# Patient Record
Sex: Female | Born: 1945 | Race: Black or African American | Hispanic: No | Smoking: Former smoker
Health system: Southern US, Community
[De-identification: ages and names within clinical notes are randomized; demographics above are authoritative.]

## PROBLEM LIST (undated history)

## (undated) DIAGNOSIS — I2510 Atherosclerotic heart disease of native coronary artery without angina pectoris: Secondary | ICD-10-CM

## (undated) DIAGNOSIS — I10 Essential (primary) hypertension: Secondary | ICD-10-CM

## (undated) DIAGNOSIS — Z803 Family history of malignant neoplasm of breast: Secondary | ICD-10-CM

## (undated) DIAGNOSIS — C801 Malignant (primary) neoplasm, unspecified: Secondary | ICD-10-CM

## (undated) DIAGNOSIS — E785 Hyperlipidemia, unspecified: Secondary | ICD-10-CM

## (undated) DIAGNOSIS — J449 Chronic obstructive pulmonary disease, unspecified: Secondary | ICD-10-CM

## (undated) DIAGNOSIS — K219 Gastro-esophageal reflux disease without esophagitis: Secondary | ICD-10-CM

## (undated) DIAGNOSIS — E119 Type 2 diabetes mellitus without complications: Secondary | ICD-10-CM

## (undated) DIAGNOSIS — M1990 Unspecified osteoarthritis, unspecified site: Secondary | ICD-10-CM

## (undated) DIAGNOSIS — Z8042 Family history of malignant neoplasm of prostate: Secondary | ICD-10-CM

## (undated) DIAGNOSIS — E079 Disorder of thyroid, unspecified: Secondary | ICD-10-CM

## (undated) DIAGNOSIS — E039 Hypothyroidism, unspecified: Secondary | ICD-10-CM

## (undated) DIAGNOSIS — Z807 Family history of other malignant neoplasms of lymphoid, hematopoietic and related tissues: Secondary | ICD-10-CM

## (undated) DIAGNOSIS — R911 Solitary pulmonary nodule: Secondary | ICD-10-CM

## (undated) HISTORY — DX: Disorder of thyroid, unspecified: E07.9

## (undated) HISTORY — DX: Hyperlipidemia, unspecified: E78.5

## (undated) HISTORY — DX: Type 2 diabetes mellitus without complications: E11.9

## (undated) HISTORY — DX: Solitary pulmonary nodule: R91.1

## (undated) HISTORY — DX: Unspecified osteoarthritis, unspecified site: M19.90

## (undated) HISTORY — DX: Chronic obstructive pulmonary disease, unspecified: J44.9

## (undated) HISTORY — PX: BREAST EXCISIONAL BIOPSY: SUR124

## (undated) HISTORY — DX: Family history of malignant neoplasm of breast: Z80.3

## (undated) HISTORY — DX: Essential (primary) hypertension: I10

## (undated) HISTORY — DX: Family history of other malignant neoplasms of lymphoid, hematopoietic and related tissues: Z80.7

## (undated) HISTORY — PX: THYROIDECTOMY: SHX17

## (undated) HISTORY — DX: Family history of malignant neoplasm of prostate: Z80.42

---

## 2006-01-25 ENCOUNTER — Ambulatory Visit: Payer: Self-pay | Admitting: Family Medicine

## 2006-01-25 IMAGING — US US PELV - US TRANSVAGINAL
1 series · 14 of 25 positions shown · non-contrast
Comparison: none

REASON FOR EXAM: abdominopelvic pain
COMMENTS:

[Series 1: us pelv - us transvaginal · 0.31mm/px · 14 of 56 slices shown]
[im 1/56]
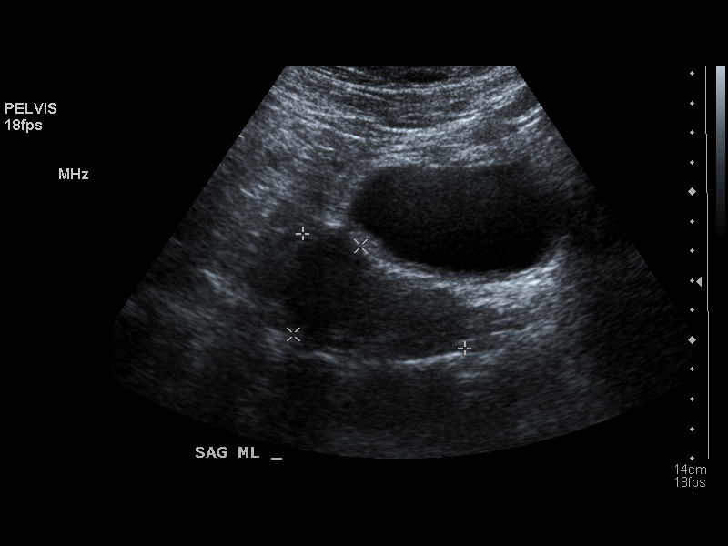
[im 5/56]
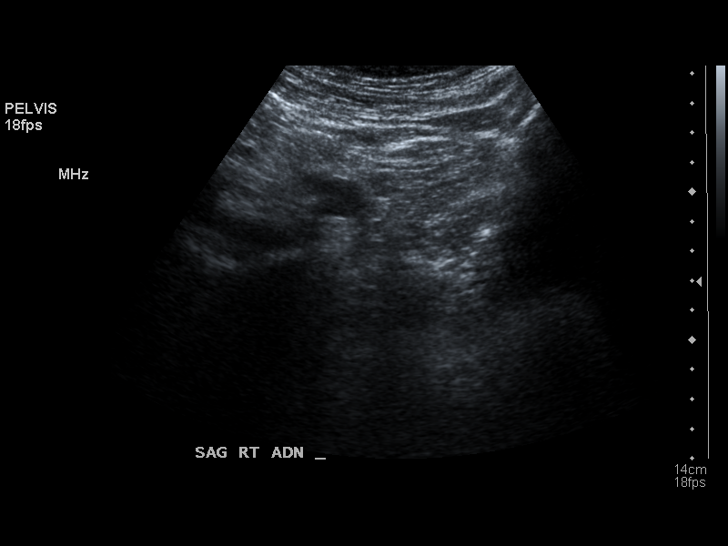
[im 10/56]
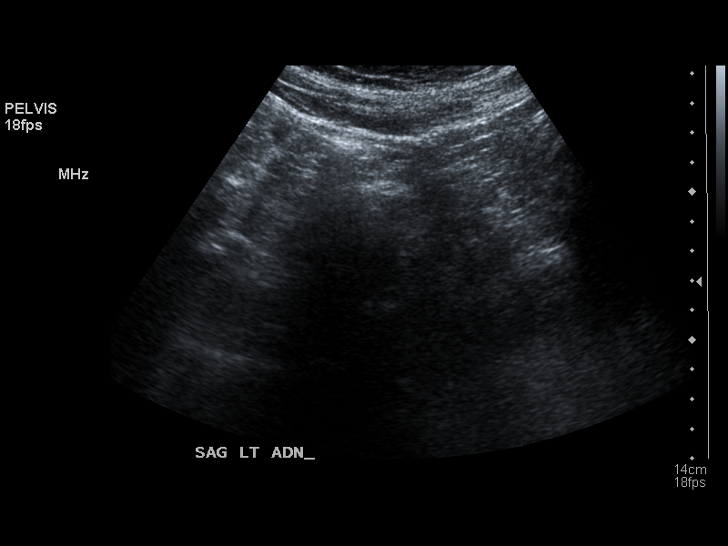
[im 14/56]
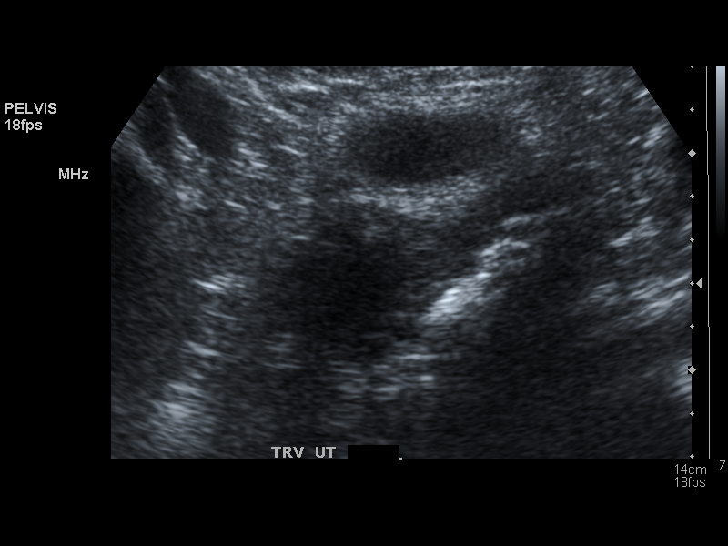
[im 19/56]
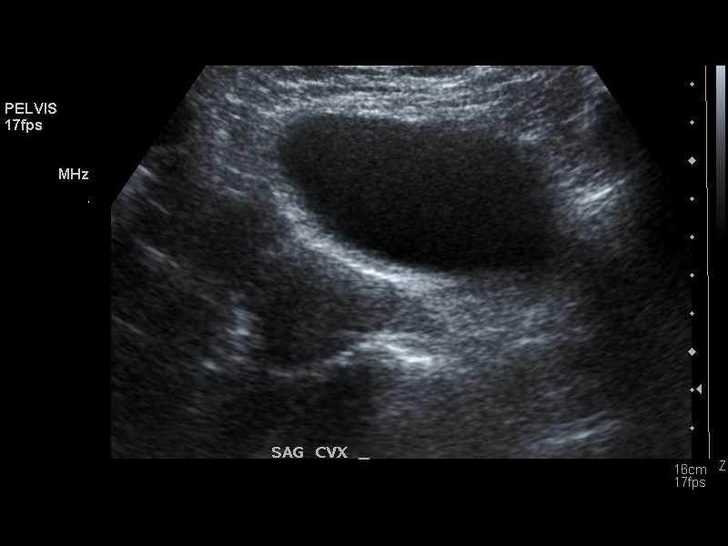
[im 21/56]
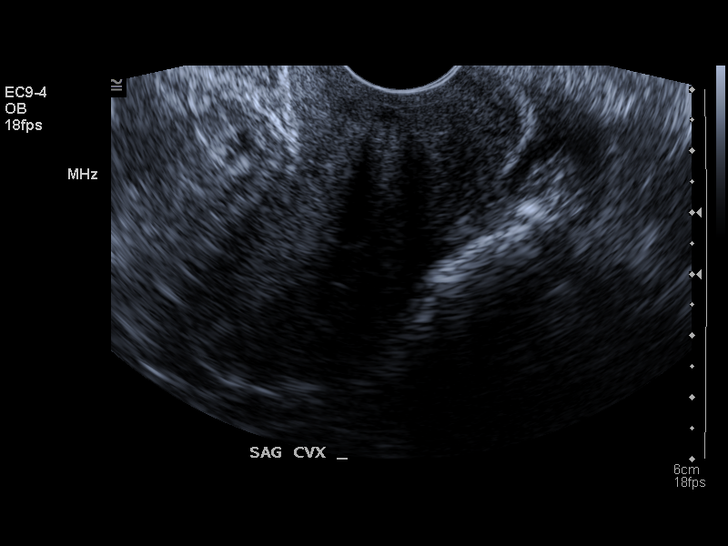
[im 26/56]
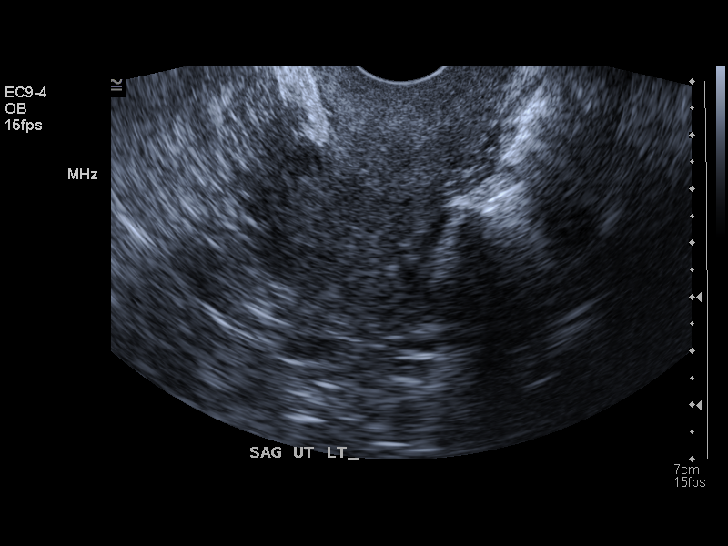
[im 30/56]
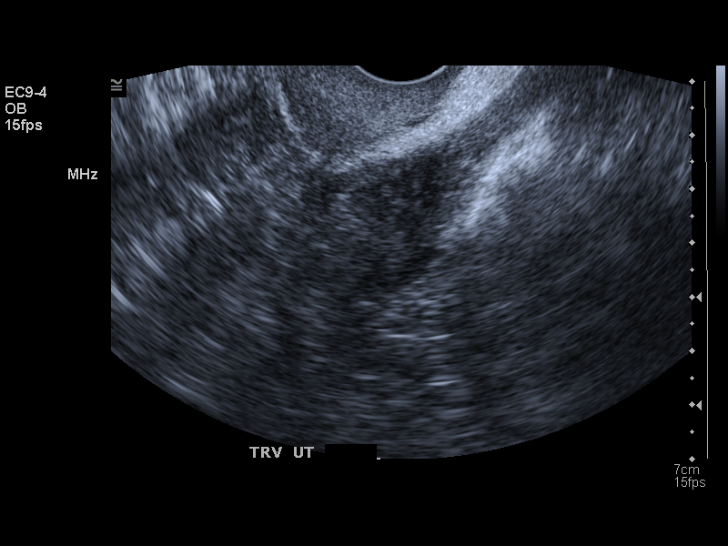
[im 35/56]
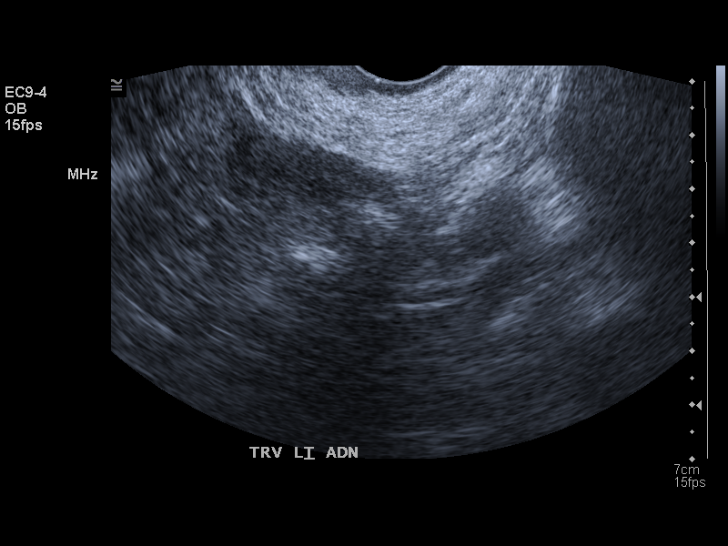
[im 37/56]
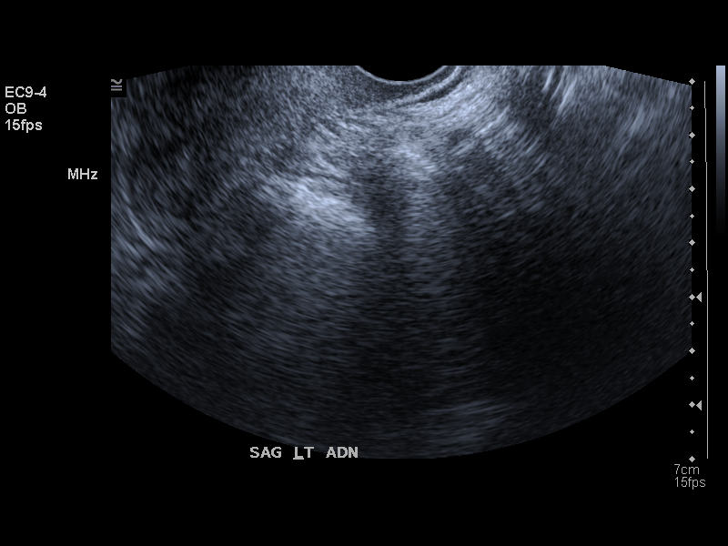
[im 42/56]
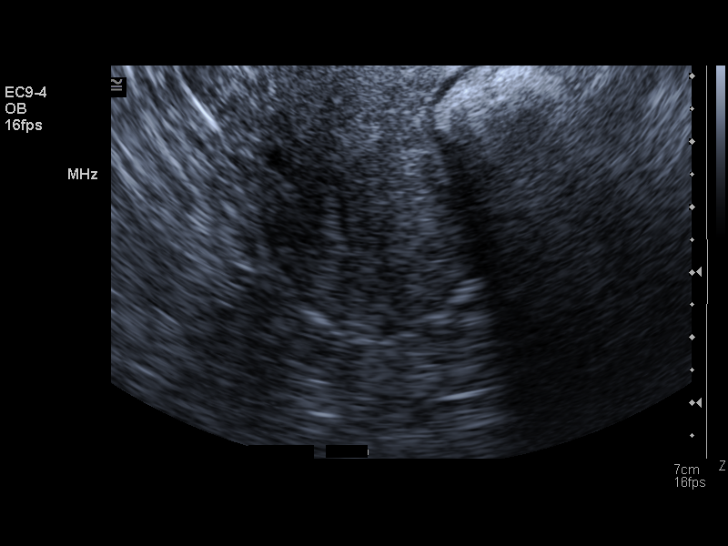
[im 46/56]
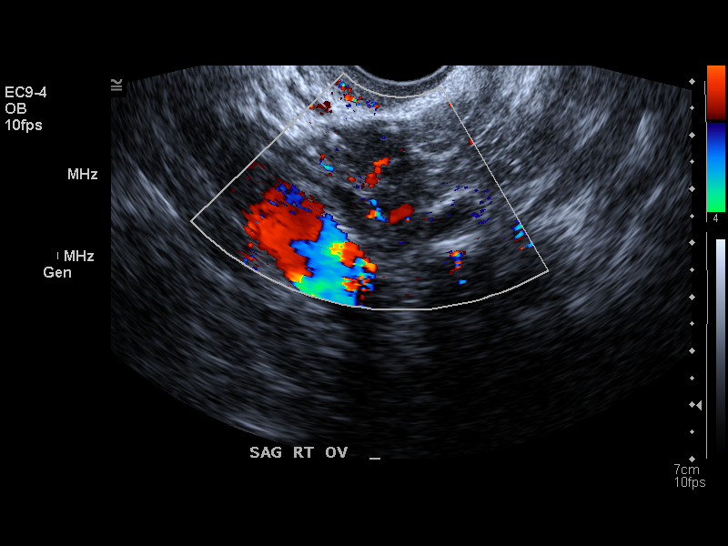
[im 51/56]
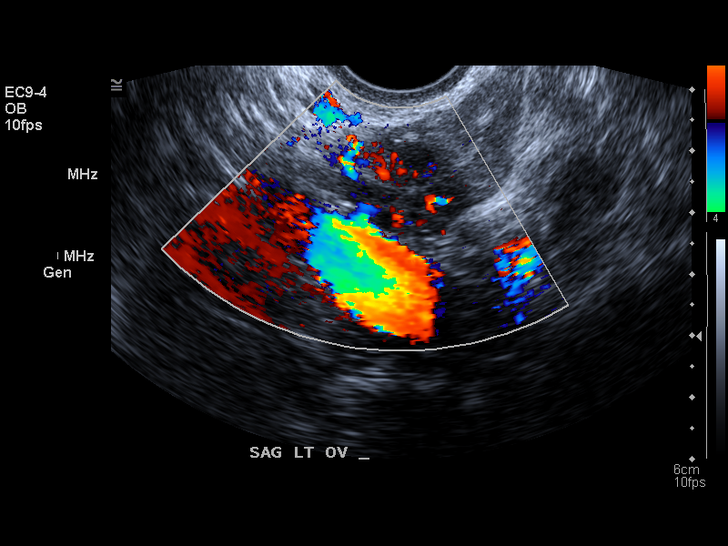
[im 56/56]
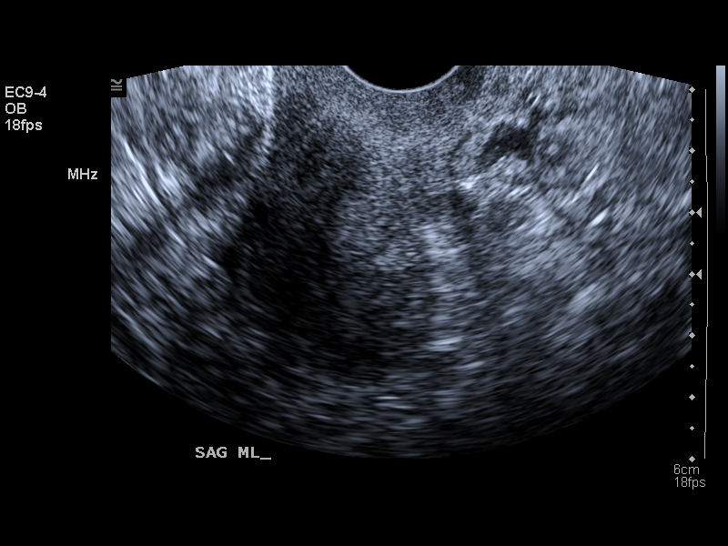

[14 of 25 positions shown; findings below may reference images not displayed]

PROCEDURE:     US  - US PELVIS MASS EXAM  - [DATE]  [DATE] [DATE] [DATE]

RESULT:     The uterus measures 7.02 cm x 3.24 cm x 3.46 cm.  No uterine
mass lesions are seen. The endometrium measures 3.9 mm in thickness. There
is a trace of fluid in the endometrial cavity.  No abnormal adnexal masses
are identified. The RIGHT ovary measures 2.63 cm at maximum diameter and the
LEFT ovary measures 2.17 cm at maximum diameter.  No abnormal adnexal masses
are seen.  The kidneys show no hydronephrosis.
IMPRESSION: 1)No uterine mass lesions are seen.

2)The endometrium appears slightly thickened for the patient's age.
Additionally, there is noted a small amount of fluid in the endometrial
cavity, which is an abnormal finding for the post-menopausal patient.

3)No abnormal adnexal masses are seen.

4)There is no free fluid identified in the pelvis.

## 2006-01-25 IMAGING — US ABDOMEN ULTRASOUND
1 series · 17 of 25 positions shown · non-contrast
Comparison: none

REASON FOR EXAM: abdominopelvic pain
COMMENTS:

[Series 1: abdomen ultrasound · 17 of 54 slices shown]
[im 1/54]
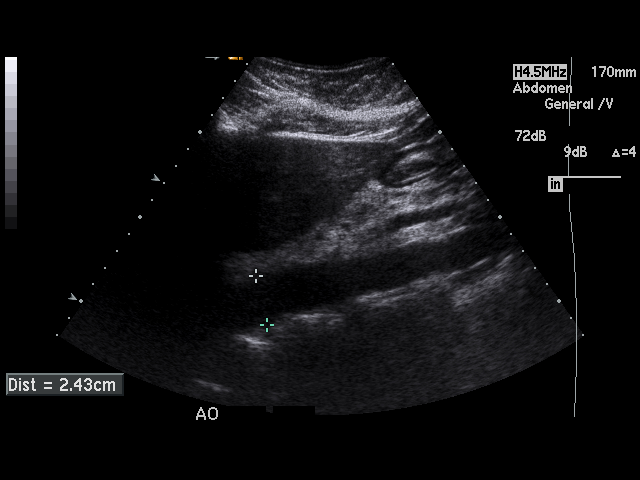
[im 5/54]
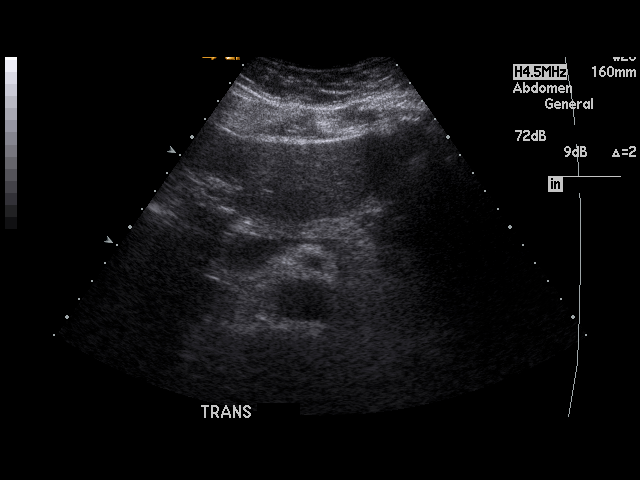
[im 7/54]
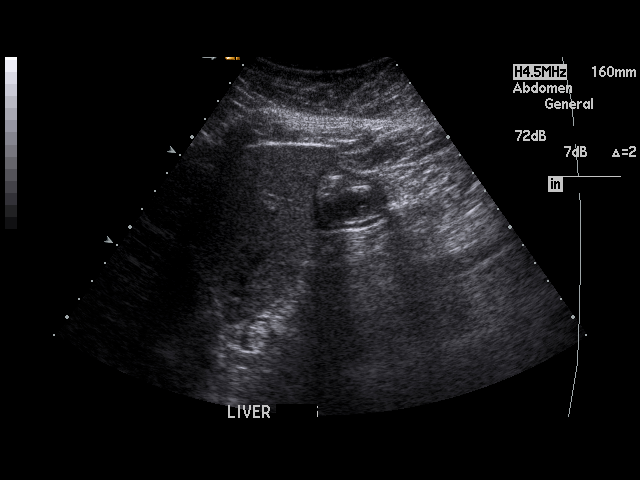
[im 12/54]
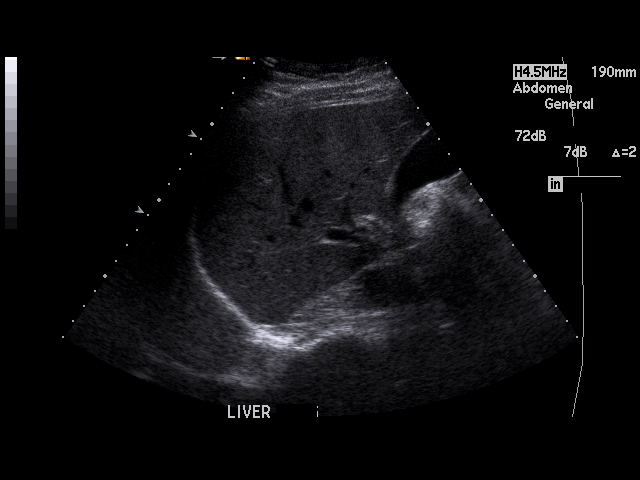
[im 14/54]
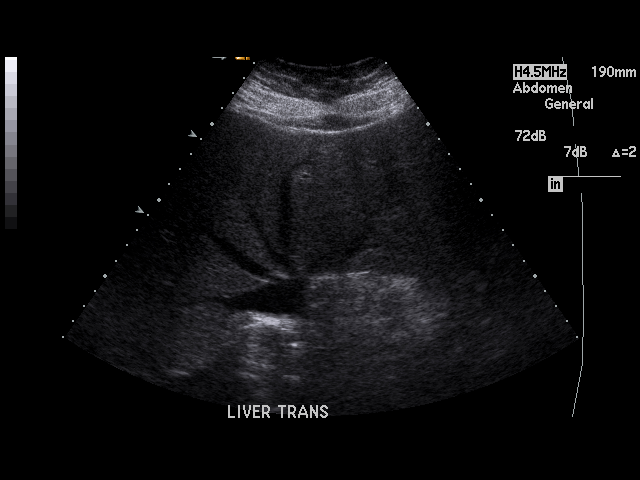
[im 18/54]
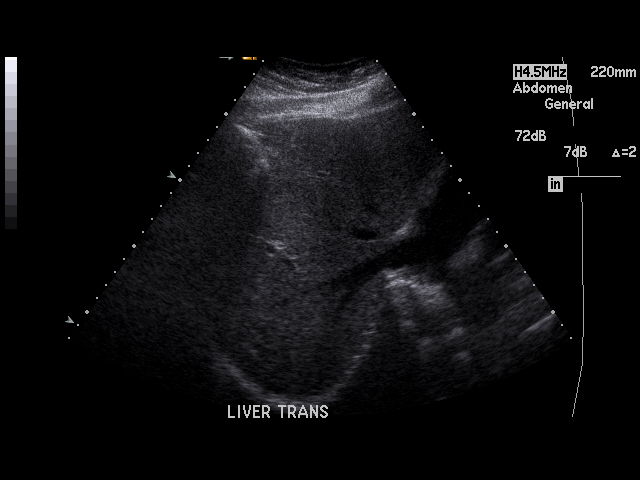
[im 20/54]
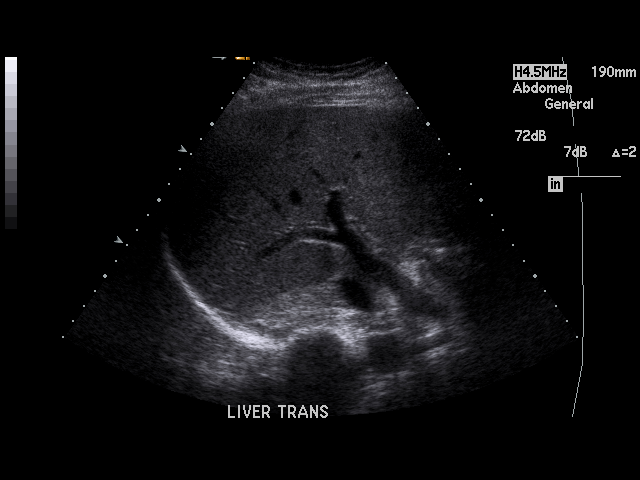
[im 25/54]
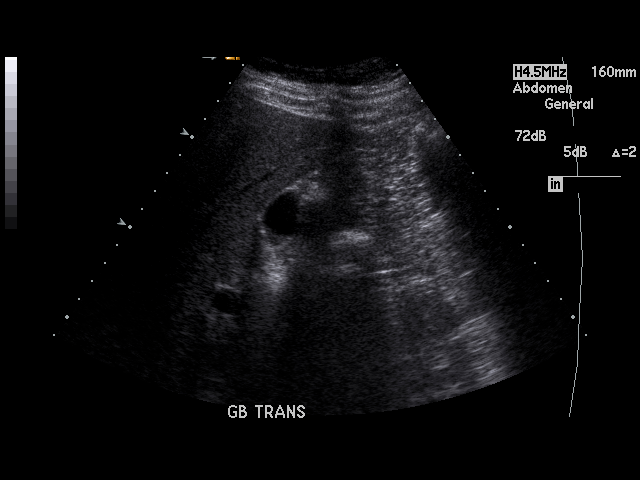
[im 27/54]
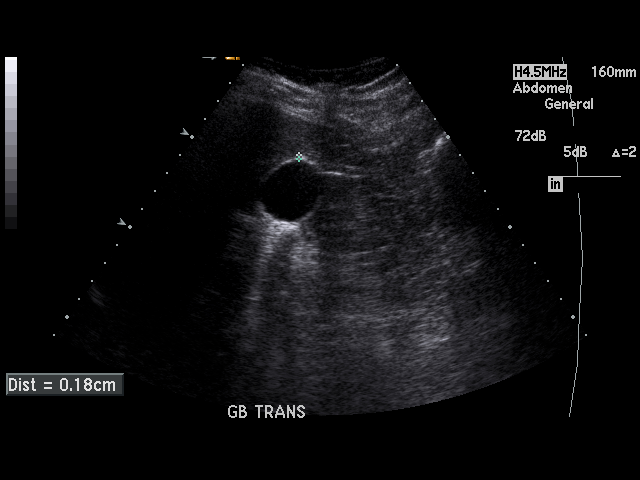
[im 29/54]
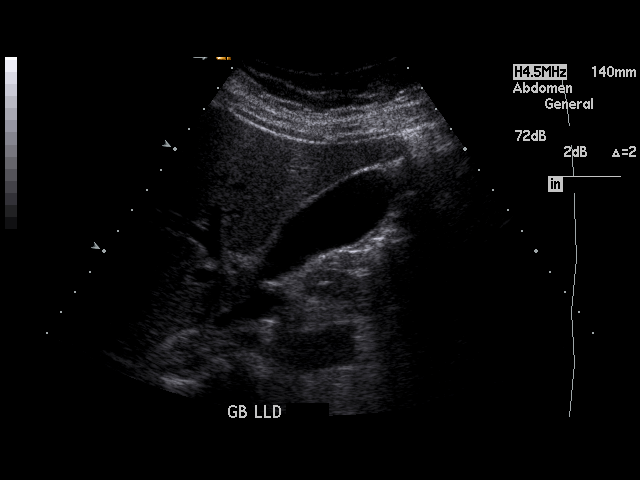
[im 34/54]
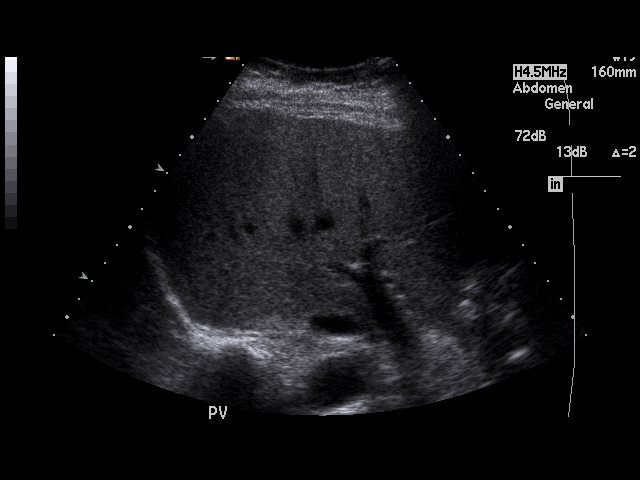
[im 36/54]
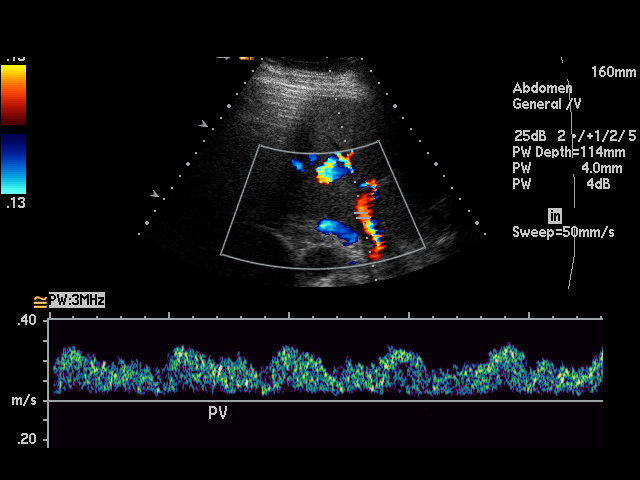
[im 40/54]
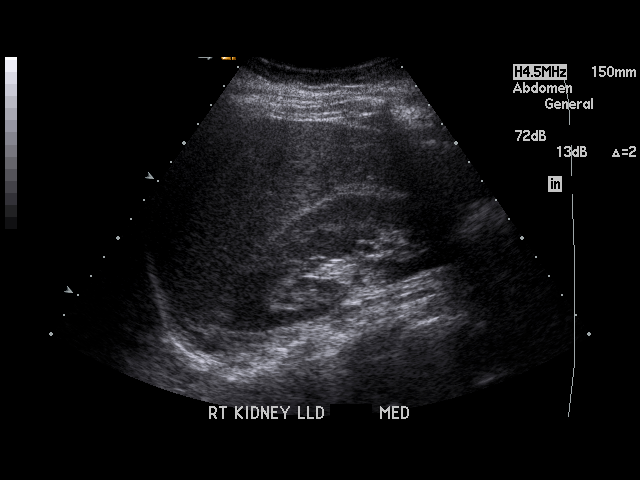
[im 42/54]
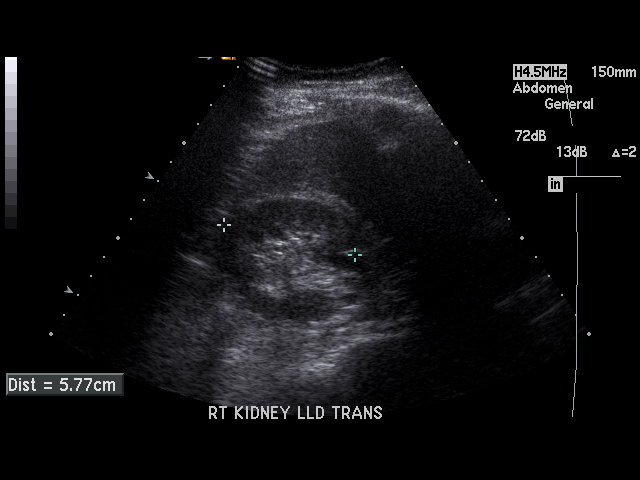
[im 47/54]
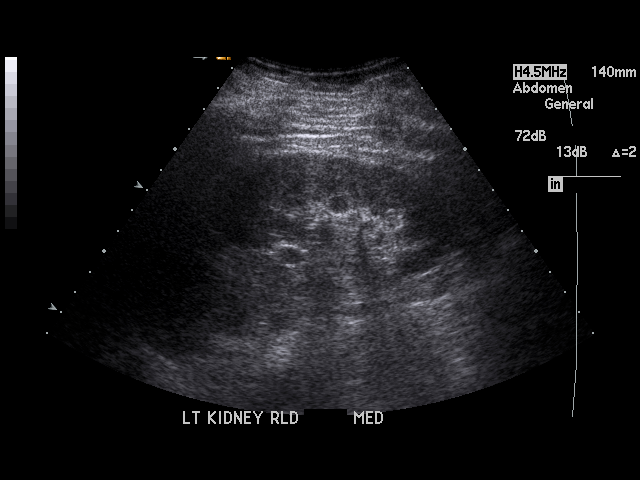
[im 49/54]
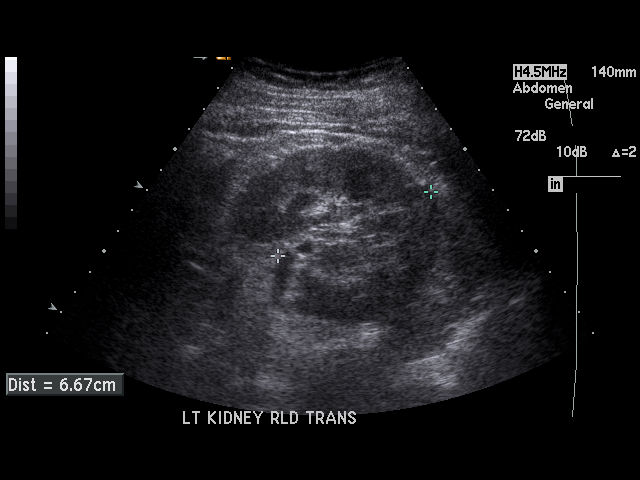
[im 54/54]
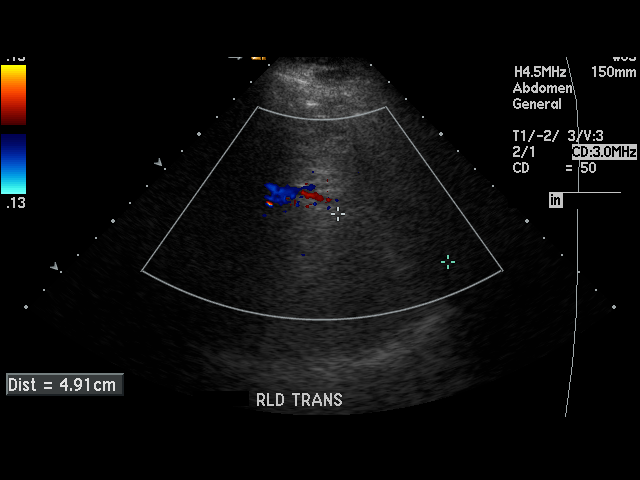

[17 of 25 positions shown; findings below may reference images not displayed]

PROCEDURE:     US  - US ABDOMEN GENERAL SURVEY  - [DATE]  [DATE]

RESULT:     The liver is normal in appearance. The spleen is not well seen
but appears to be normal in size. The pancreas is not visualized adequately
for evaluation on this exam.  The abdominal aorta is visualized and is
normal in appearance.  The gallbladder shows no significant abnormalities.
No gallstones are seen. There is no thickening of the gallbladder wall. The
common bile duct measures 3.7 mm in diameter, which is within normal limits.
The kidneys show no hydronephrosis. There is no ascites.
IMPRESSION: 1)No significant abnormalities are noted.

2)The pancreas and spleen are not optimally visualized on this exam.

3)No gallstones are seen.

## 2007-10-19 LAB — HM COLONOSCOPY: HM Colonoscopy: NORMAL

## 2009-01-22 ENCOUNTER — Ambulatory Visit: Payer: Self-pay | Admitting: Gastroenterology

## 2009-10-18 LAB — HM DIABETES FOOT EXAM

## 2009-12-01 ENCOUNTER — Ambulatory Visit: Payer: Self-pay | Admitting: Family Medicine

## 2009-12-01 IMAGING — MG MM DIGITAL SCREENING BILAT W/ CAD
1 series · 5 of 5 positions shown · non-contrast
Comparison: none

REASON FOR EXAM: CORP SCR
COMMENTS:

[Series 3179: R CC · right · 5 of 5 slices shown]
[im 1/5]
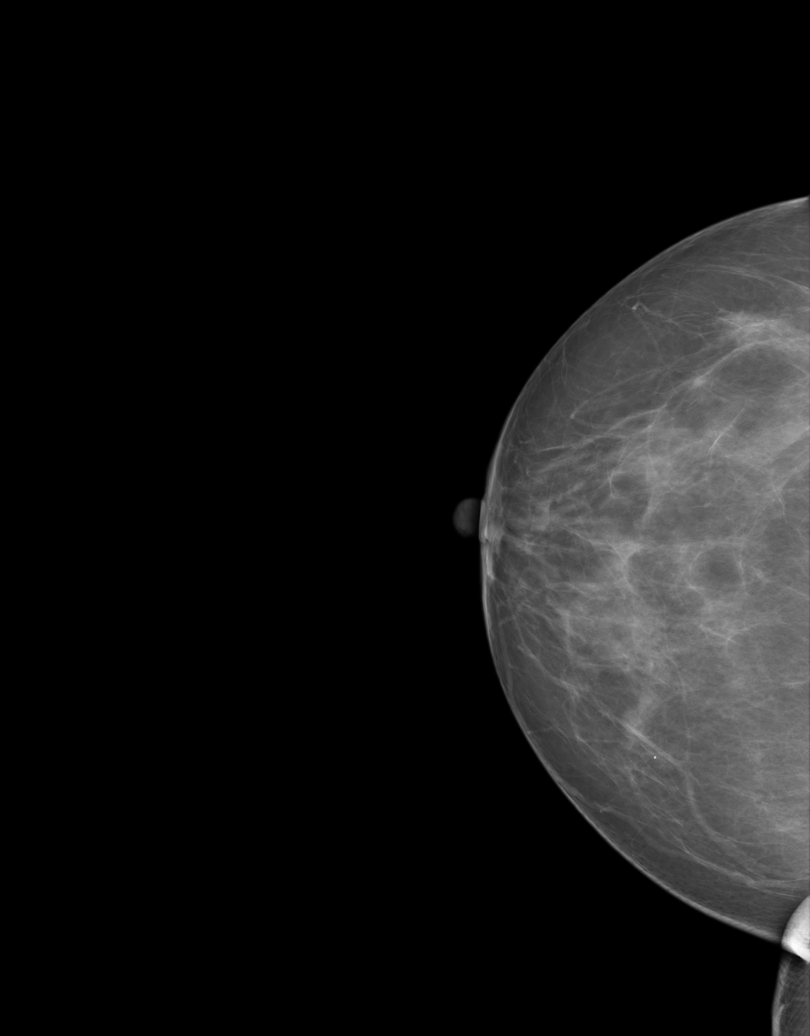
[im 2/5]
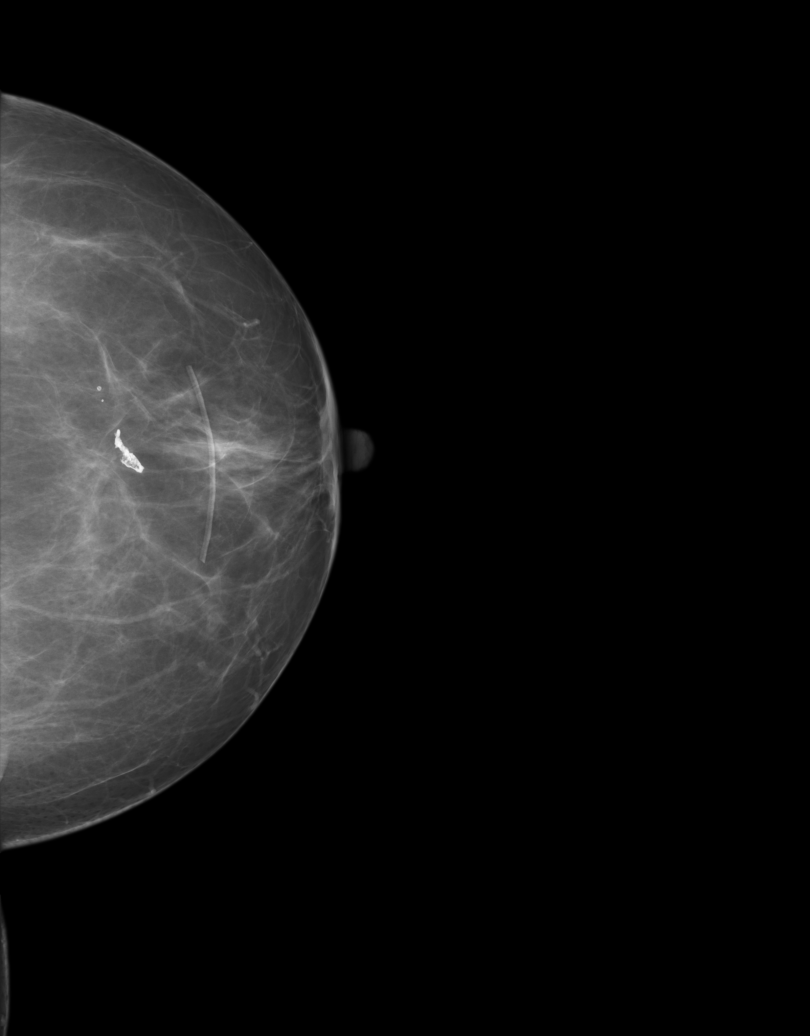
[im 3/5]
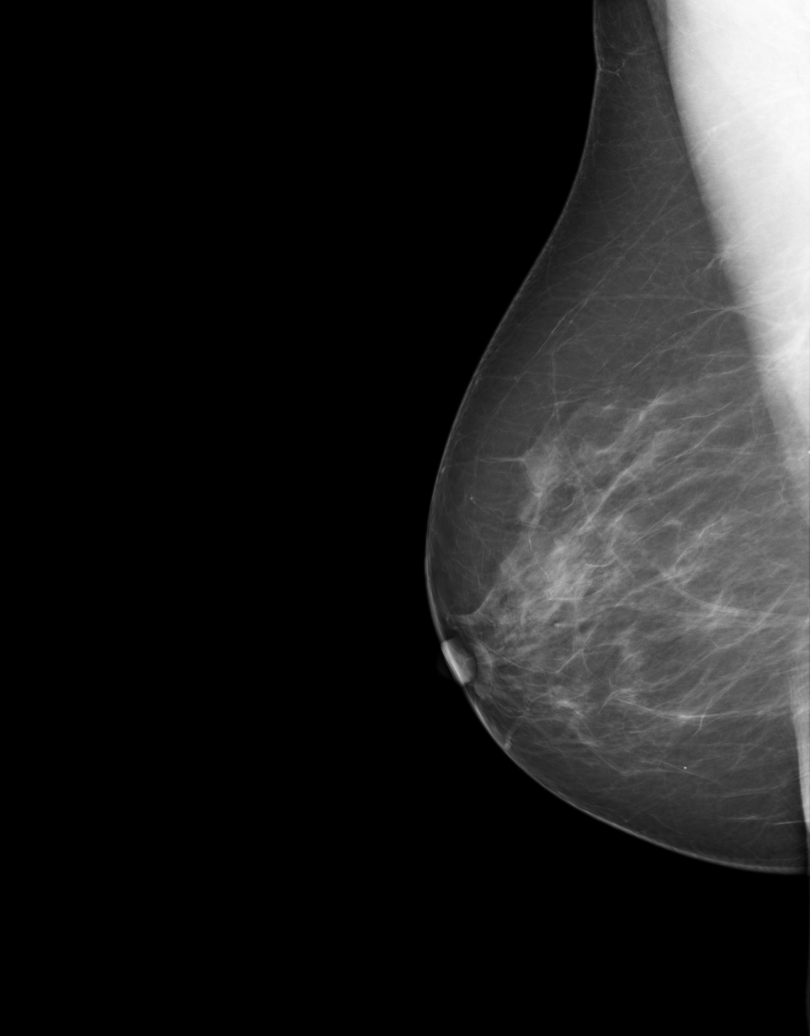
[im 4/5]
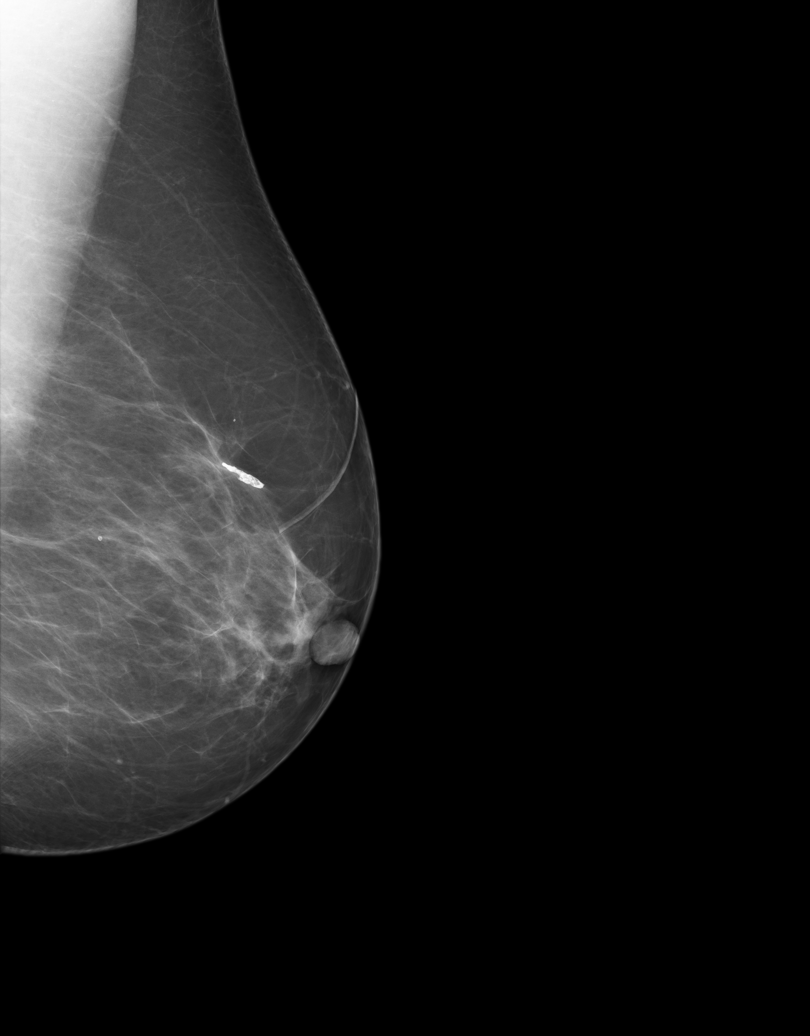
[im 5/5]
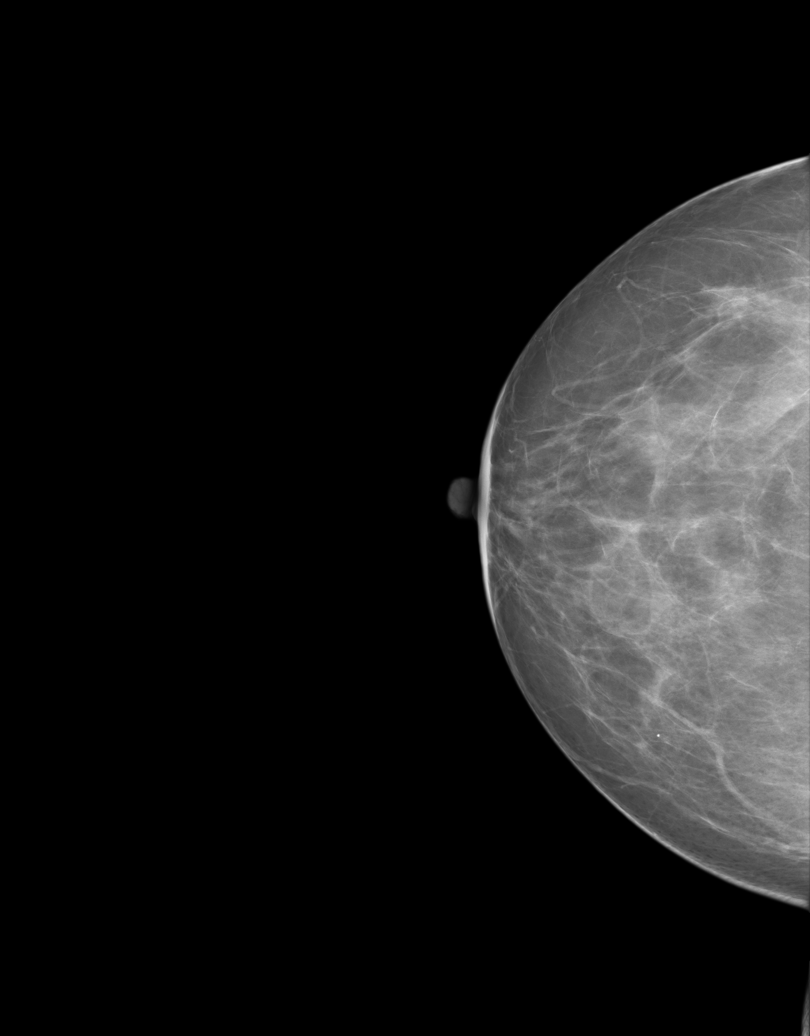

[5 of 5 positions shown; findings below may reference images not displayed]

PROCEDURE:     MAM - MAM CORP SCRN MAMMO DIGITAL /CAD  - [DATE]  [DATE]

RESULT:     Images are performed on [DATE] but not submitted until
[DATE] for interpretation. Comparison is made to previous analog images
dated [DATE] and to digital images from [REDACTED] dated [DATE] and to [HOSPITAL] Digital Images dated [DATE] as
well as [DATE].

The breasts exhibit a moderately dense parenchymal pattern. There is a scar
marker in the superior mid left breast centrally with some dystrophic
calcification posterior to that and some architectural distortion underlying
that. No developing density or dominant mass is present. There is no
malignant calcification. The appearance is stable.
IMPRESSION: Post operative changes in the left breast in the superior
central portion. No definite mammographic evidence of malignancy. Please
continue to encourage annual mammographic follow-up and monthly breast self
exam.

BI-RADS: Category 2 - Benign Findings

Thank you for this opportunity to contribute to the care of your patient.

A NEGATIVE MAMMOGRAM REPORT DOES NOT PRECLUDE BIOPSY OR OTHER EVALUATION OF
A CLINICALLY PALPABLE OR OTHERWISE SUSPICIOUS MASS OR LESION. BREAST CANCER
MAY NOT BE DETECTED BY MAMMOGRAPHY IN UP TO 10% OF CASES.

## 2010-12-14 ENCOUNTER — Ambulatory Visit: Payer: Self-pay | Admitting: Family Medicine

## 2010-12-14 IMAGING — MG MM DIGITAL SCREENING BILAT W/ CAD
1 series · 4 of 4 positions shown · non-contrast
Comparison: [DATE] [DATE].

REASON FOR EXAM: SCR CORP
COMMENTS:

PROCEDURE:     MAM - MAM CORP SCRN MAMMO DIGITAL /CAD  - [DATE]  [DATE]
RESULT:

[R CC · right · 4 of 4 slices shown]
[im 1/4]
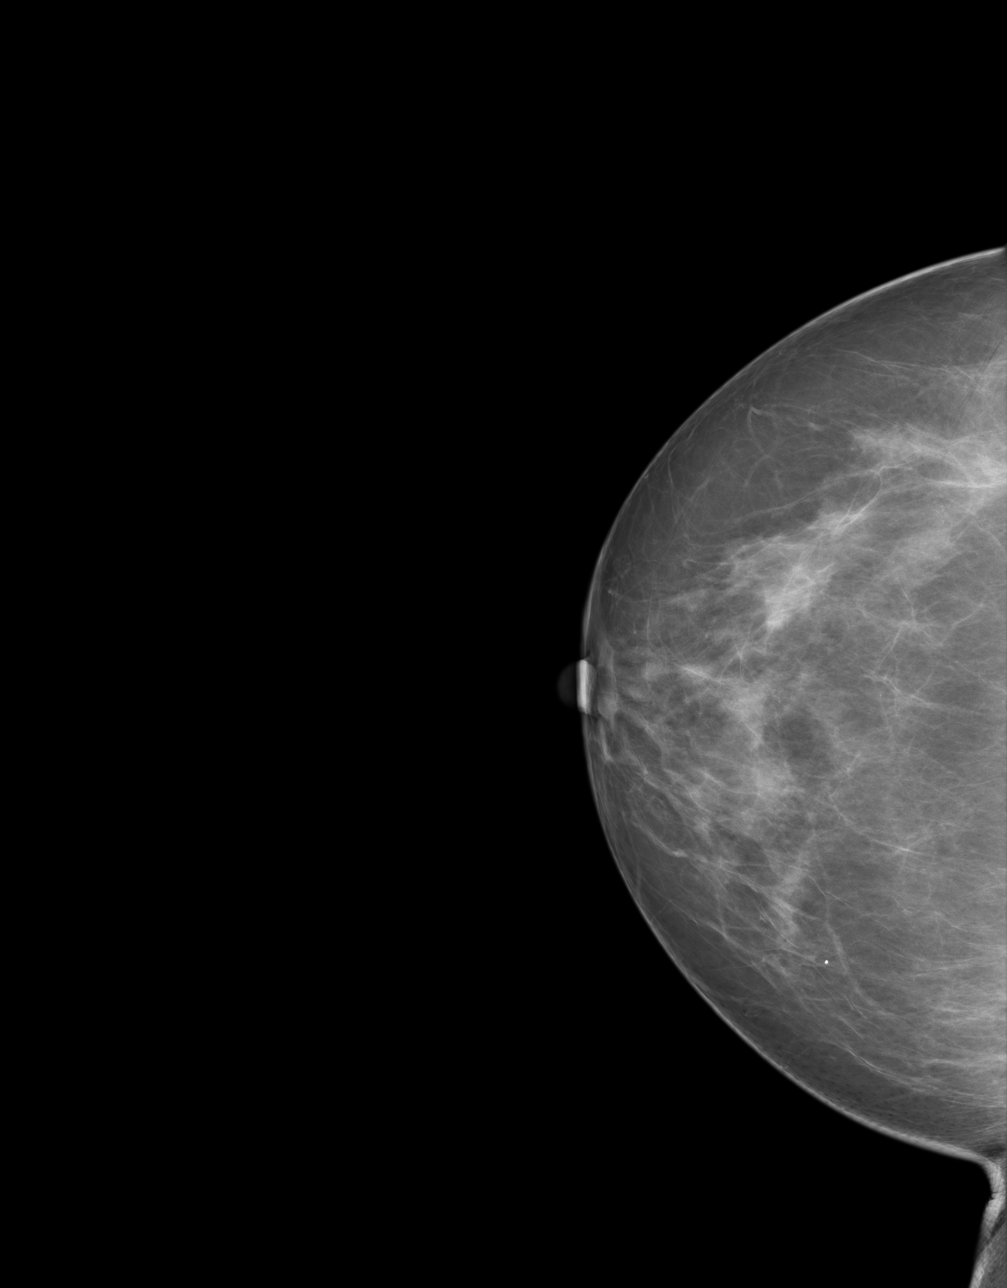
[im 2/4]
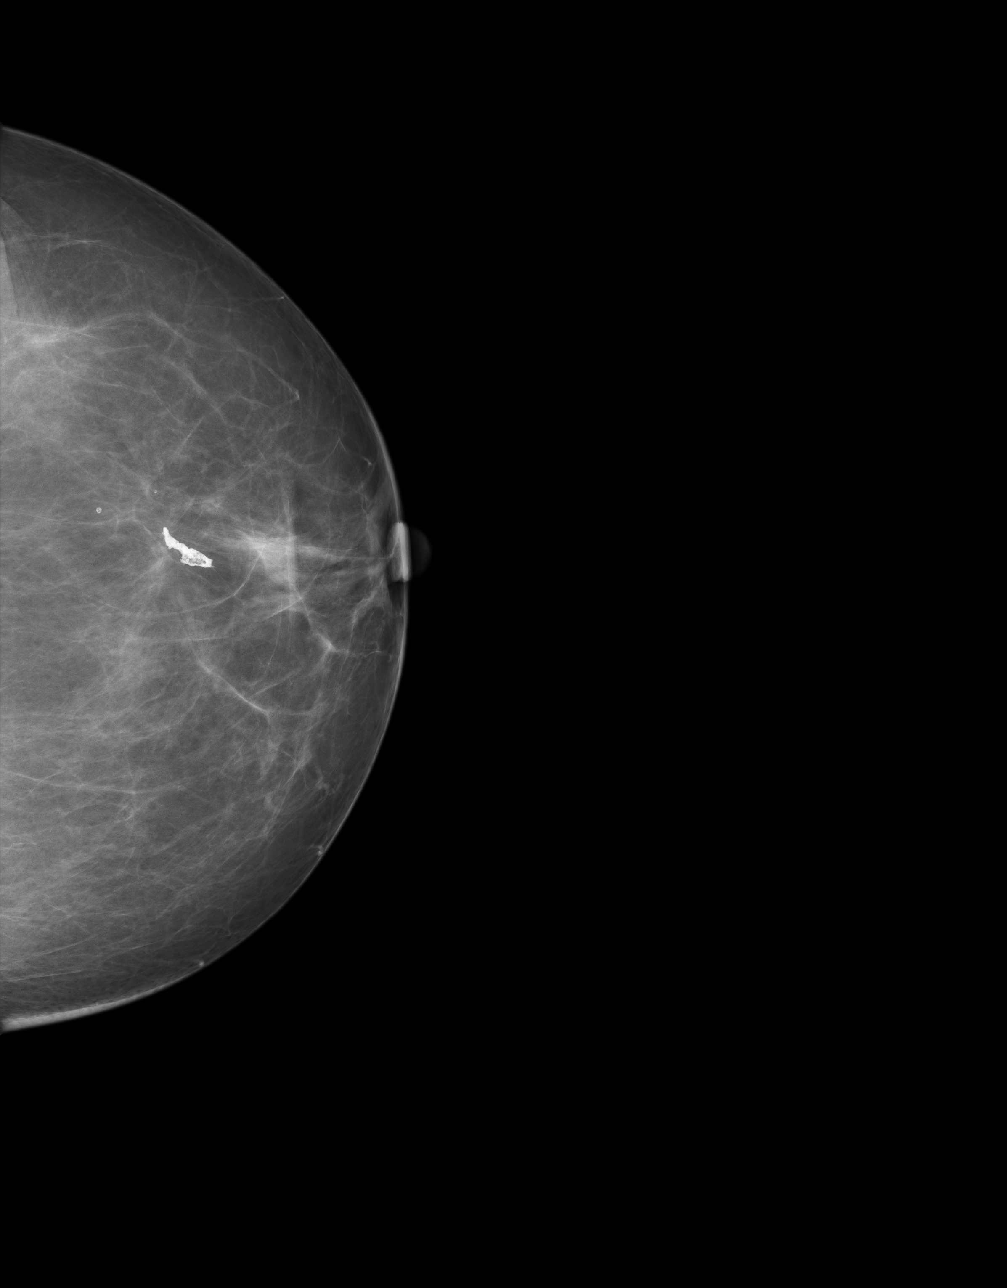
[im 3/4]
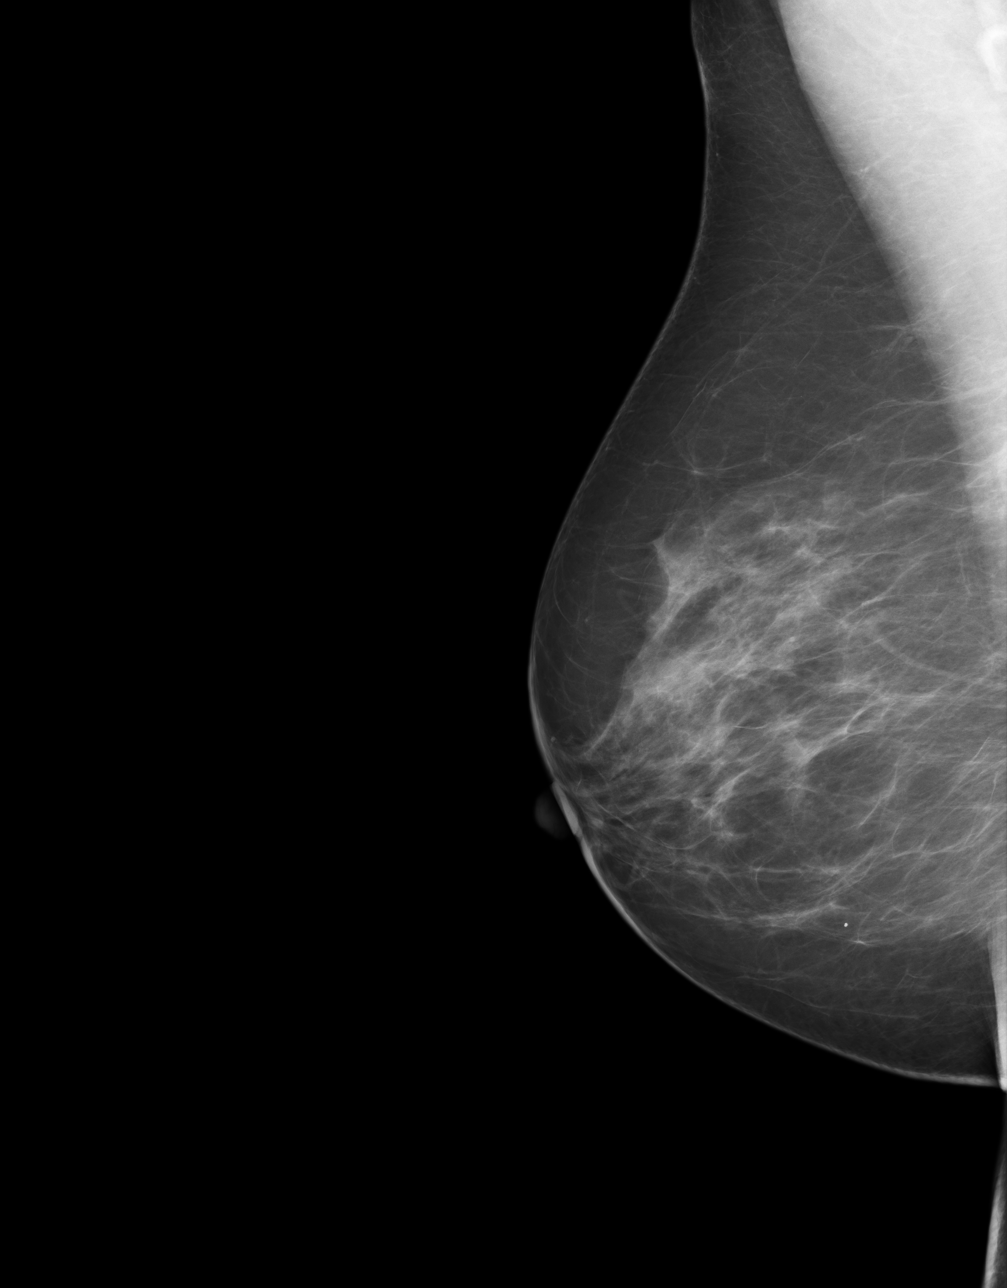
[im 4/4]
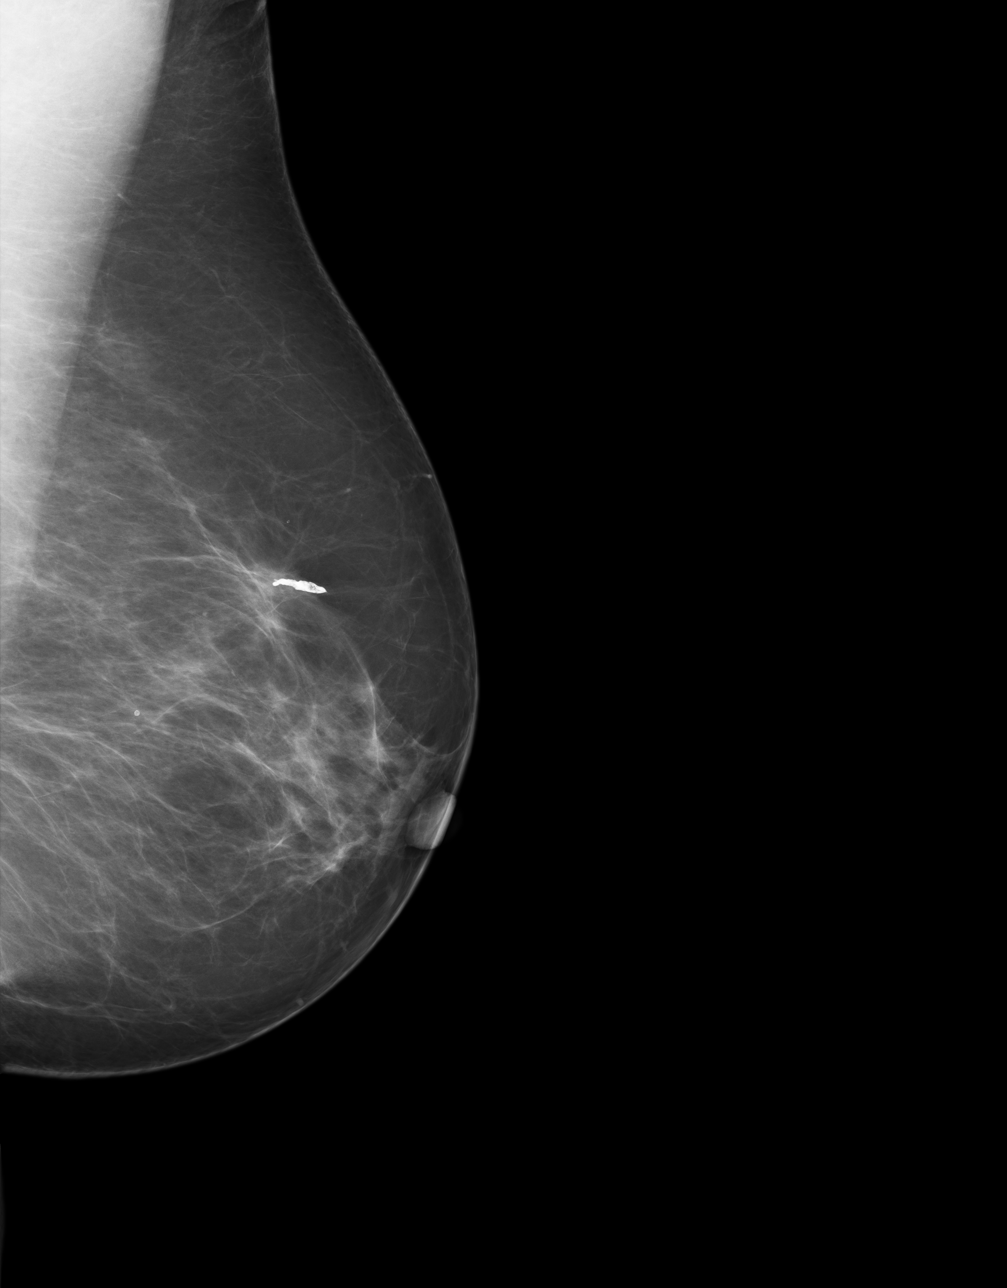

[4 of 4 positions shown; findings below may reference images not displayed]

FINDINGS: Bilateral breasts demonstrate a scattered fibroglandular density.
There is no dominant mass, architectural distortion or clusters of
suspicious microcalcifications.
IMPRESSION: 1.      Stable bilateral mammogram.
2.      Annual mammographic follow-up recommended.

BI-RADS: Category 2 - Benign Findings

Thank you for this opportunity to contribute to the care of your patient.

A NEGATIVE MAMMOGRAM REPORT DOES NOT PRECLUDE BIOPSY OR OTHER EVALUATION OF
A CLINICALLY PALPABLE OR OTHERWISE SUSPICIOUS MASS OR LESION. BREAST CANCER
MAY NOT BE DETECTED BY MAMMOGRAPHY IN UP TO 10% OF CASES.

## 2011-10-28 ENCOUNTER — Ambulatory Visit: Payer: Self-pay

## 2011-10-28 IMAGING — CR DG LUMBAR SPINE AP/LAT/OBLIQUES W/ FLEX AND EXT
1 series · 6 of 6 positions shown · non-contrast
Comparison: none

REASON FOR EXAM: back pain
COMMENTS:

[Series 1: t lumbar spine ap · 0.14mm/px · 6 of 6 slices shown]
[im 1/6]
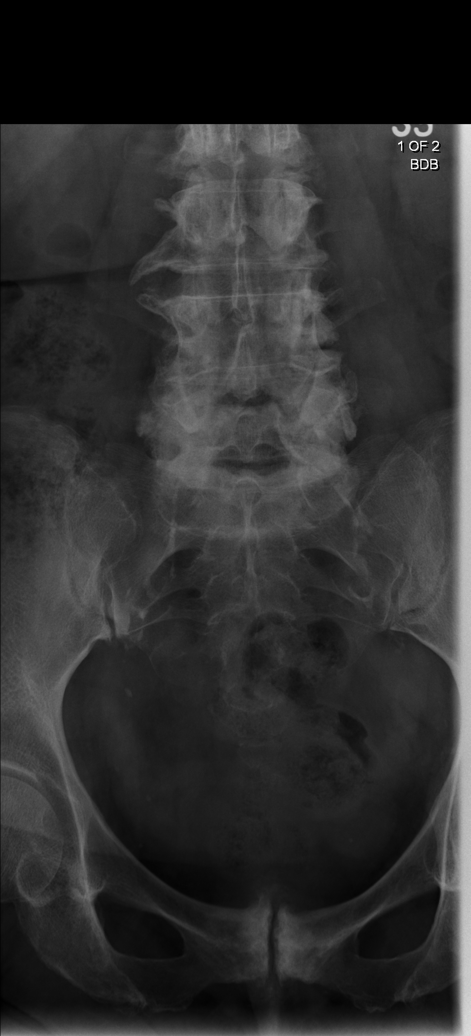
[im 2/6]
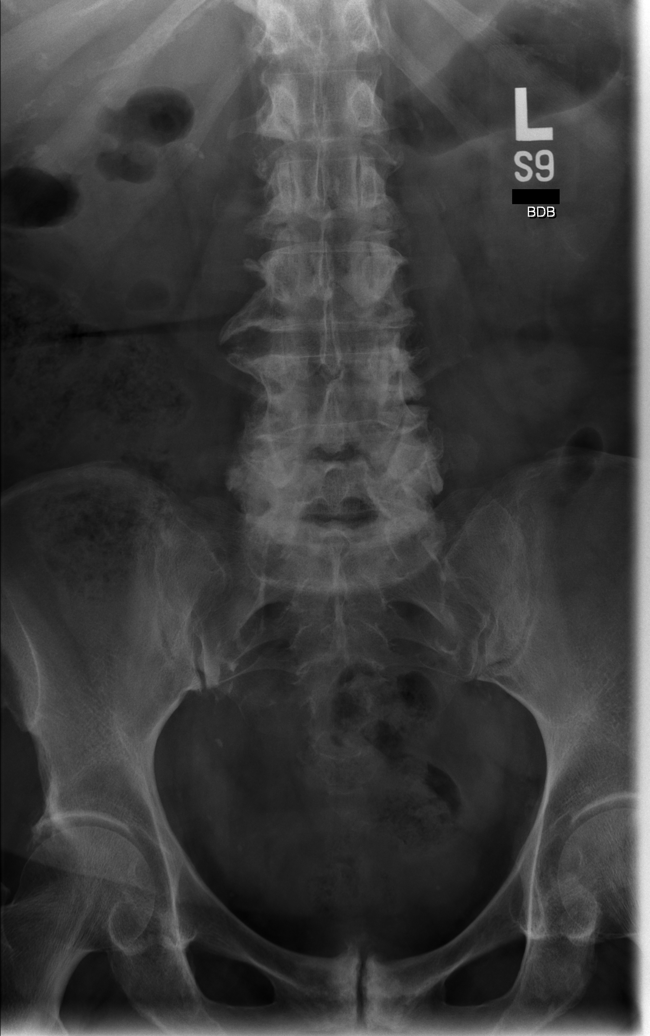
[im 3/6]
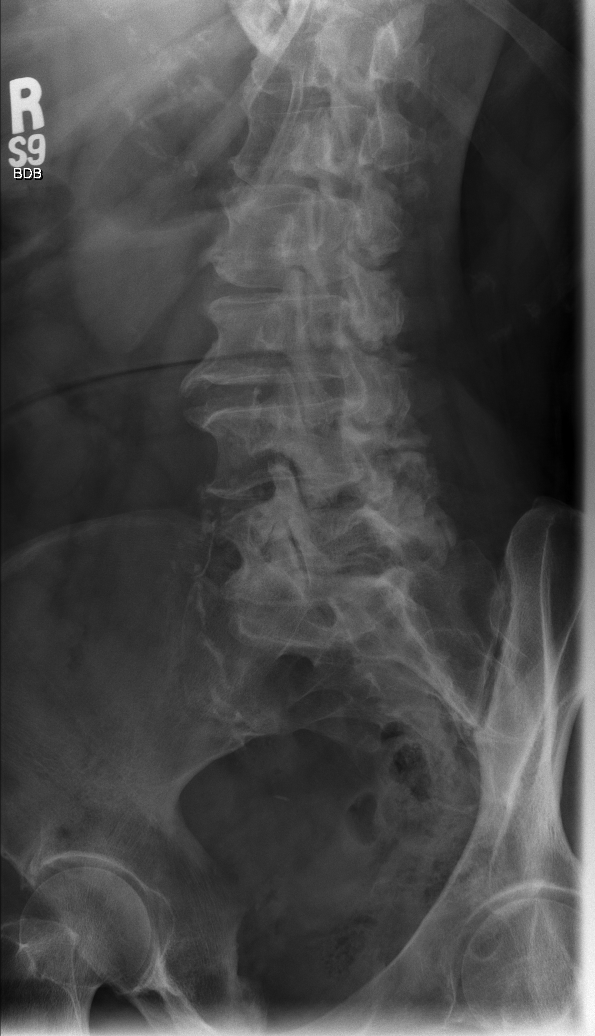
[im 4/6]
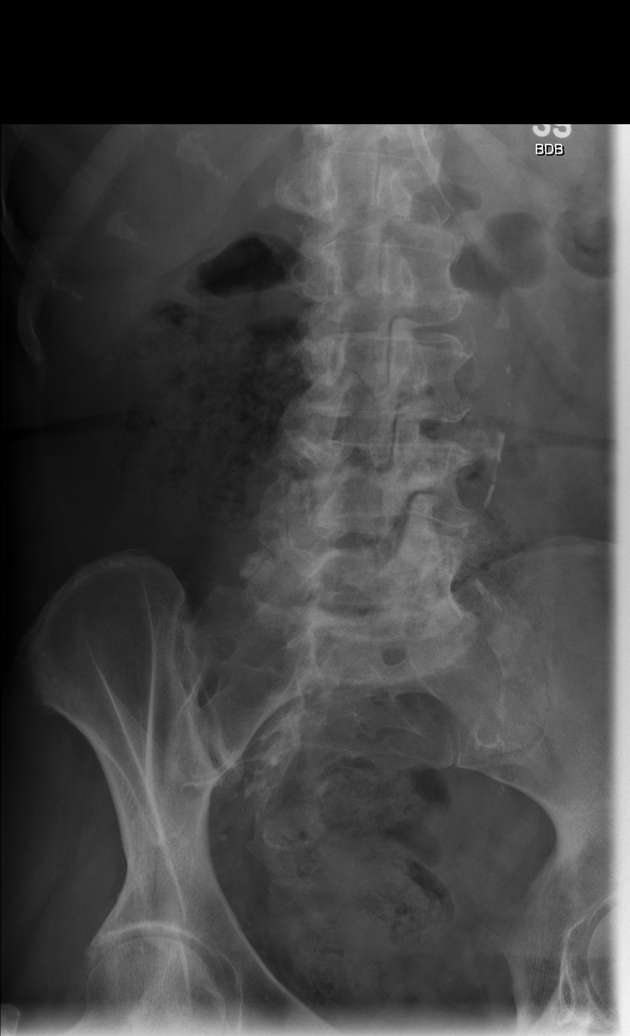
[im 5/6]
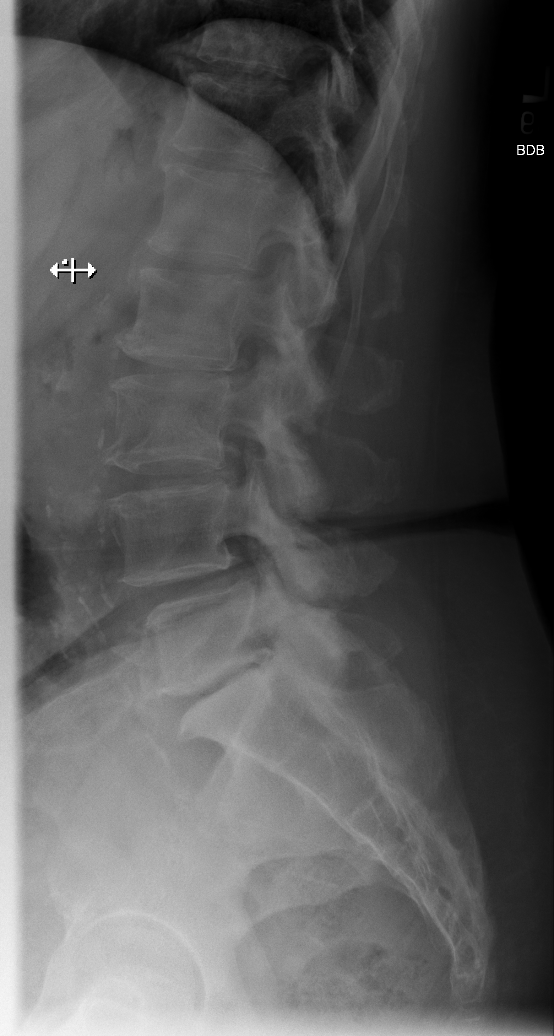
[im 6/6]
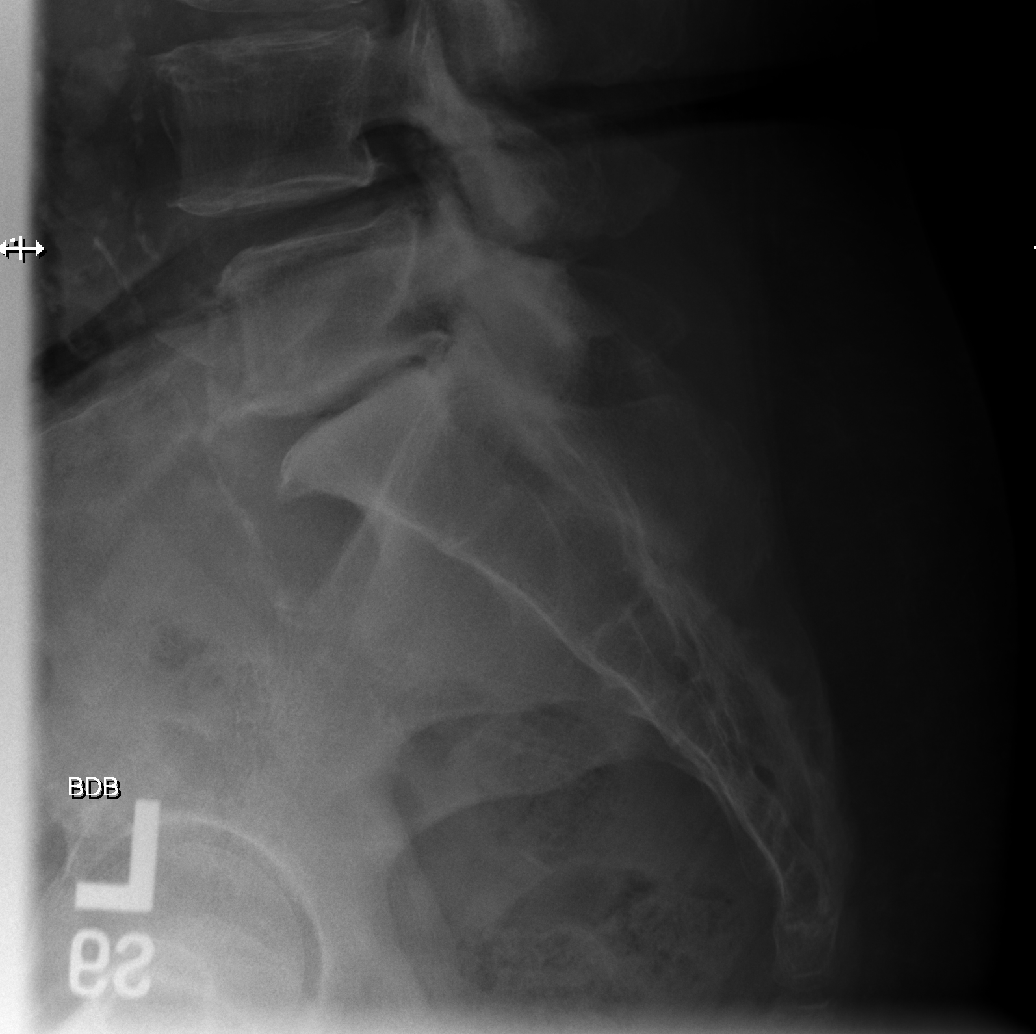

[6 of 6 positions shown; findings below may reference images not displayed]

PROCEDURE:     DXR - DXR LUMBAR SPINE WITH OBLIQUES  - [DATE] [DATE]

RESULT:     The vertebral body heights are well maintained. Vertebral body
alignment is normal. There is narrowing of the L5-S1 intervertebral disc
space consistent with disc disease. This could be further evaluated by MR if
clinically indicated. Oblique views show the articular facets to be intact.
The pedicles are normal in appearance bilaterally. No lytic or blastic
lesions are seen. Hypertrophic spurring is present at multiple levels.
IMPRESSION: 1. There is narrowing of the L5-S1 intervertebral disc space suspicious for
disc disease. This could be further evaluated by MR if clinically indicated
2. No fracture is seen.

## 2011-12-30 ENCOUNTER — Ambulatory Visit: Payer: Self-pay | Admitting: Family Medicine

## 2011-12-30 IMAGING — MG MM DIGITAL SCREENING BILAT W/ CAD
1 series · 5 of 5 positions shown · non-contrast
Comparison: none

REASON FOR EXAM: CORP SCR MAMMO NO ORDER
COMMENTS:

PROCEDURE:     MAM - MAM DGT SCR W/CAD CORP NO ORDER  - [DATE] [DATE]
RESULT:
Comparisons: [DATE] and [DATE].

[Series 4493: R CC · right · 5 of 5 slices shown]
[im 1/5]
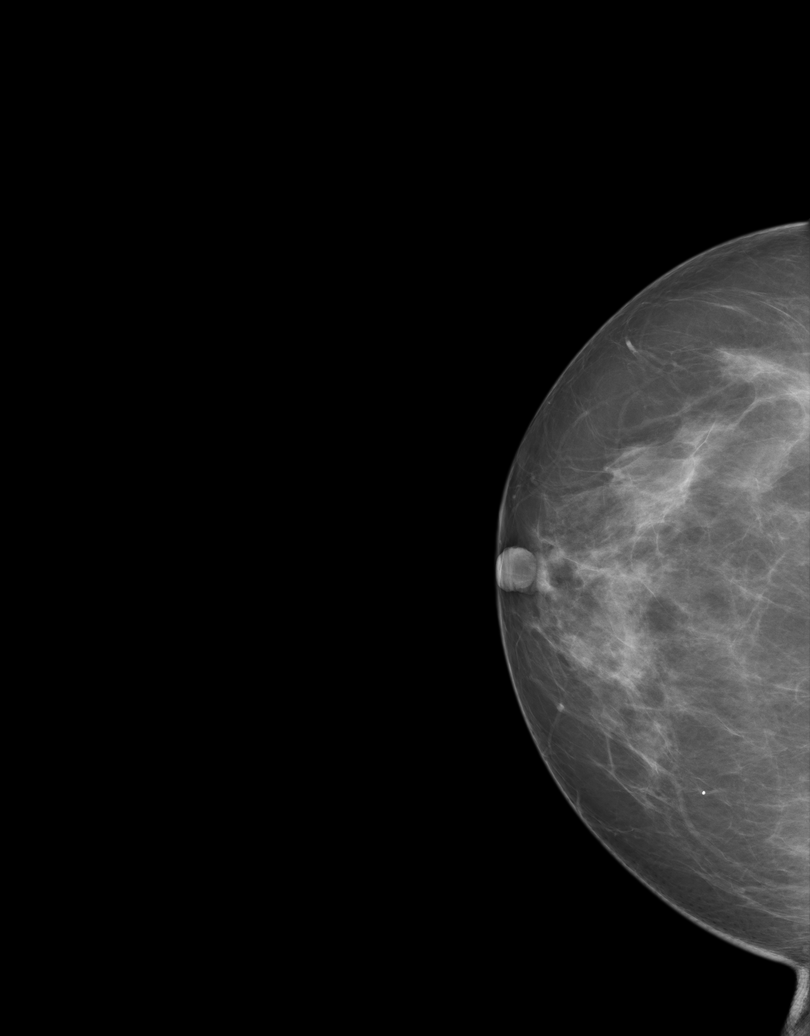
[im 2/5]
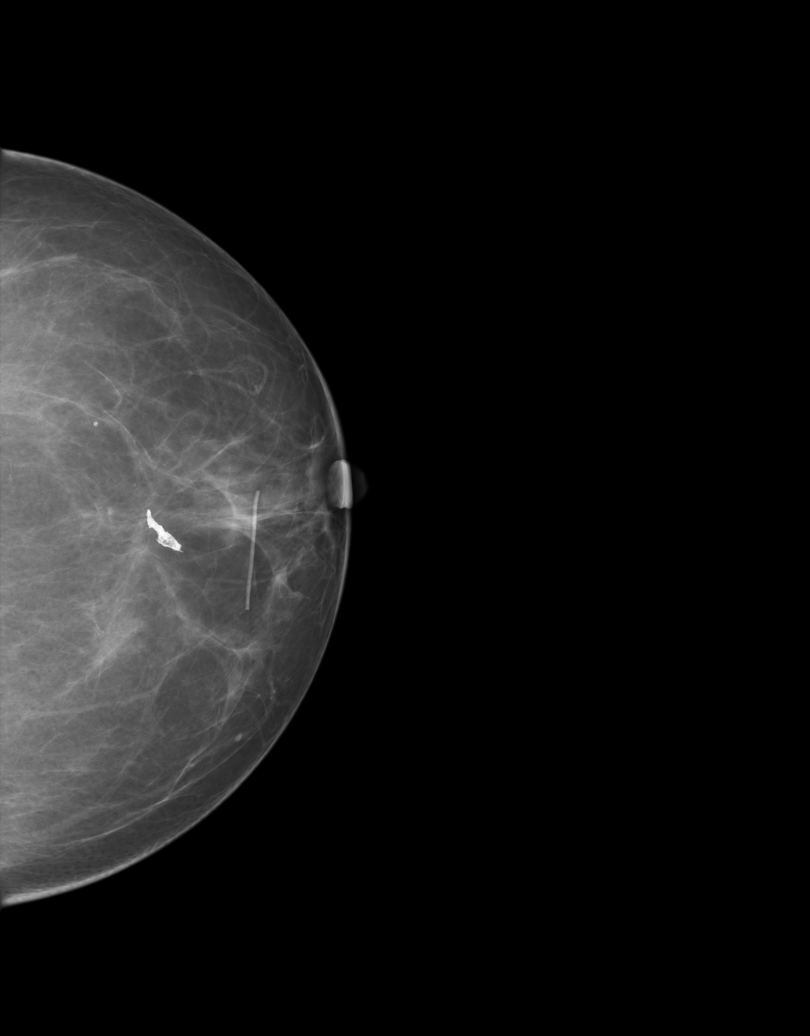
[im 3/5]
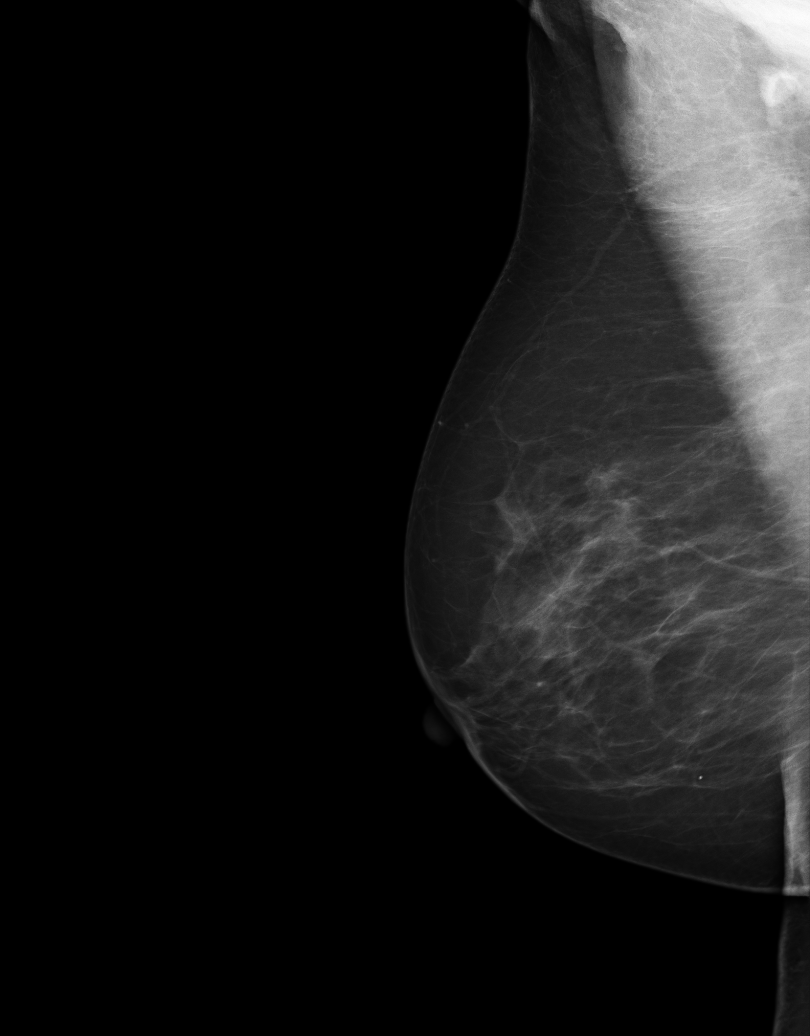
[im 4/5]
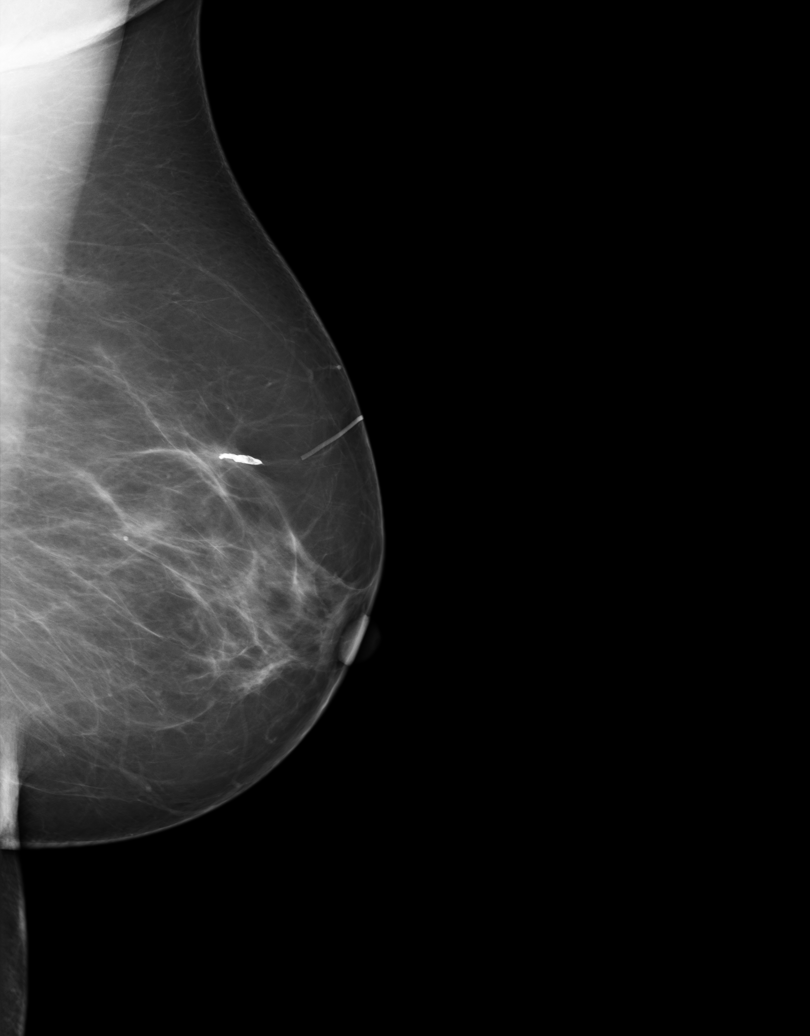
[im 5/5]
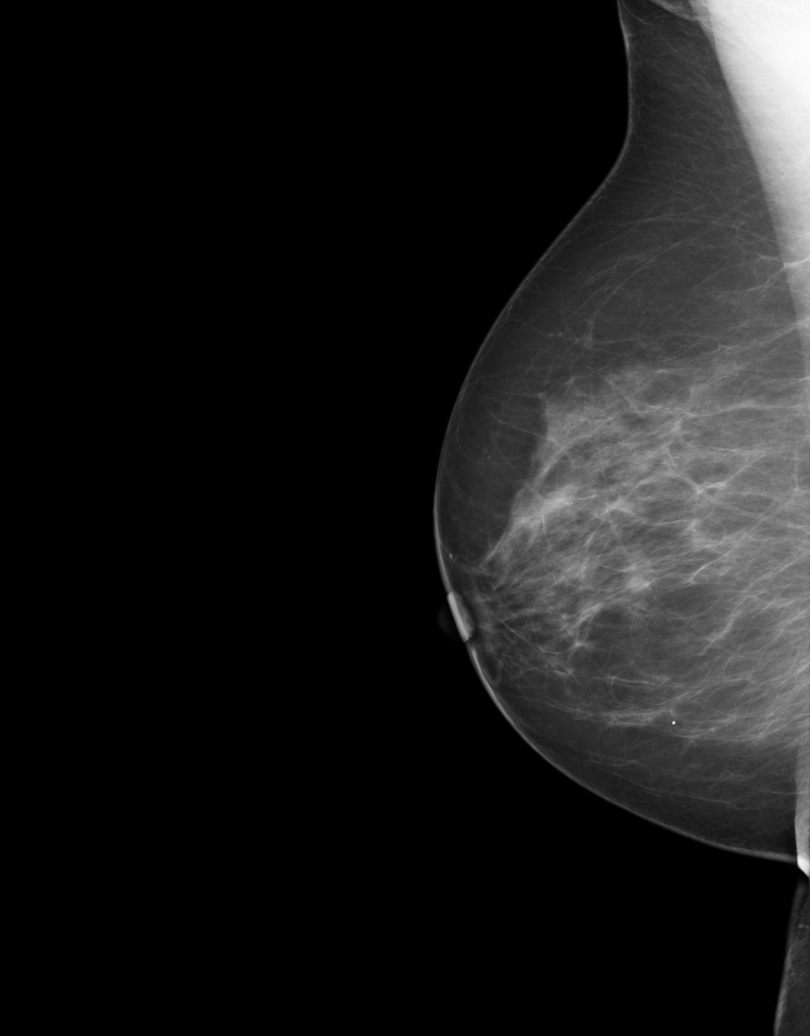

[5 of 5 positions shown; findings below may reference images not displayed]

FINDINGS: There is scattered fibroglandular tissue. No suspicious masses or
calcifications are identified. No areas of architectural distortion.
IMPRESSION: BI-RADS: Category 1 - Negative

Recommend continued annual screening mammography.

A NEGATIVE MAMMOGRAM REPORT DOES NOT PRECLUDE BIOPSY OR OTHER EVALUATION OF
A CLINICALLY PALPABLE OR OTHERWISE SUSPICIOUS MASS OR LESION. BREAST CANCER
MAY NOT BE DETECTED BY MAMMOGRAPHY IN UP TO 10% OF CASES.

## 2012-04-17 LAB — HM PAP SMEAR: HM Pap smear: NORMAL

## 2012-10-18 ENCOUNTER — Telehealth: Payer: Self-pay | Admitting: Internal Medicine

## 2012-10-18 ENCOUNTER — Ambulatory Visit (INDEPENDENT_AMBULATORY_CARE_PROVIDER_SITE_OTHER): Payer: Medicare Other | Admitting: Internal Medicine

## 2012-10-18 ENCOUNTER — Encounter: Payer: Self-pay | Admitting: Internal Medicine

## 2012-10-18 VITALS — BP 132/90 | HR 100 | Temp 98.6°F | Resp 16 | Ht 67.5 in | Wt 208.0 lb

## 2012-10-18 DIAGNOSIS — M129 Arthropathy, unspecified: Secondary | ICD-10-CM

## 2012-10-18 DIAGNOSIS — E785 Hyperlipidemia, unspecified: Secondary | ICD-10-CM | POA: Insufficient documentation

## 2012-10-18 DIAGNOSIS — M25572 Pain in left ankle and joints of left foot: Secondary | ICD-10-CM

## 2012-10-18 DIAGNOSIS — E1140 Type 2 diabetes mellitus with diabetic neuropathy, unspecified: Secondary | ICD-10-CM | POA: Insufficient documentation

## 2012-10-18 DIAGNOSIS — E119 Type 2 diabetes mellitus without complications: Secondary | ICD-10-CM

## 2012-10-18 DIAGNOSIS — R071 Chest pain on breathing: Secondary | ICD-10-CM

## 2012-10-18 DIAGNOSIS — M25579 Pain in unspecified ankle and joints of unspecified foot: Secondary | ICD-10-CM

## 2012-10-18 DIAGNOSIS — R0789 Other chest pain: Secondary | ICD-10-CM

## 2012-10-18 DIAGNOSIS — I10 Essential (primary) hypertension: Secondary | ICD-10-CM

## 2012-10-18 DIAGNOSIS — M1990 Unspecified osteoarthritis, unspecified site: Secondary | ICD-10-CM | POA: Insufficient documentation

## 2012-10-18 DIAGNOSIS — E039 Hypothyroidism, unspecified: Secondary | ICD-10-CM

## 2012-10-18 MED ORDER — LEVOTHYROXINE SODIUM 75 MCG PO TABS: 75.0000 ug | ORAL_TABLET | Freq: Every day | ORAL | Status: DC

## 2012-10-18 MED ORDER — LOSARTAN POTASSIUM-HCTZ 50-12.5 MG PO TABS: 1.0000 | ORAL_TABLET | Freq: Every day | ORAL | Status: DC

## 2012-10-18 MED ORDER — AMLODIPINE BESYLATE 10 MG PO TABS: 10.0000 mg | ORAL_TABLET | Freq: Every day | ORAL | Status: DC

## 2012-10-18 MED ORDER — MELOXICAM 15 MG PO TABS: 15.0000 mg | ORAL_TABLET | Freq: Every day | ORAL | Status: DC

## 2012-10-18 MED ORDER — ATORVASTATIN CALCIUM 20 MG PO TABS: 20.0000 mg | ORAL_TABLET | Freq: Every day | ORAL | Status: DC

## 2012-10-18 MED ORDER — METFORMIN HCL 500 MG PO TABS: 500.0000 mg | ORAL_TABLET | Freq: Two times a day (BID) | ORAL | Status: DC

## 2012-10-18 NOTE — Assessment & Plan Note (Signed)
BP Readings from Last 3 Encounters:  10/18/12 132/90   Blood pressure fairly well-controlled on current medications. Will get records on previous evaluation and management. We'll check renal function today. Followup one month.

## 2012-10-18 NOTE — Assessment & Plan Note (Signed)
Symptoms well controlled with meloxicam. We'll continue.

## 2012-10-18 NOTE — Telephone Encounter (Signed)
Can you please confirm with pt that she has no drug allergies?

## 2012-10-18 NOTE — Assessment & Plan Note (Signed)
Several month history of right-sided lower chest wall pain described as intermittently pruritic. Given her extensive smoking history, she is due for screening CT of the chest to evaluate for lung cancer. Will set up CT of the chest both for screening for lung cancer and to further evaluate right-sided lower chest wall pain.

## 2012-10-18 NOTE — Assessment & Plan Note (Signed)
Recent A1c normal at 6.2%. We'll continue metformin.

## 2012-10-18 NOTE — Assessment & Plan Note (Signed)
Recent lipids well-controlled on labs performed by her employer. Will continue atorvastatin.

## 2012-10-18 NOTE — Progress Notes (Signed)
Subjective:    Patient ID: Cynthia [name], female    DOB: [birth year], 67 y.o.   MRN: [id number]  HPI 67YO female with h/o hypertension, hyperlipidemia, diabetes mellitus presents to establish care. In regards to diabetes, she reports that recent blood sugars have been well-controlled. A1c performed in December 2013 was 6.2%. She reports compliance with her metformin. In regards to blood pressure, she has not recently checked it, but reports no chest pain, palpitations, headache. She has recently had some episodes of right lower chest wall pain, which is worse with deep inspiration. She has occasional dry cough. Denies fever or chills. She is a smoker and has smoked 0.5 PPD or more for probably 50 years. She has been trying to cut back, but has been smoking more since retiring in 08/2012.  She is also concerned about left ankle pain, described as aching, present over last few weeks. Denies injury to her ankle. Denies instability of the ankle. Occasionally has mild swelling over the site. Takes meloxicam for arthritis with some improvement.  Outpatient Encounter Prescriptions as of 10/18/2012  Medication Sig Dispense Refill  . amLODipine (NORVASC) 10 MG tablet Take 1 tablet (10 mg total) by mouth daily.  90 tablet  4  . aspirin 81 MG tablet Take 81 mg by mouth daily.      Marland Kitchen atorvastatin (LIPITOR) 20 MG tablet Take 1 tablet (20 mg total) by mouth daily.  90 tablet  4  . Calcium Carbonate-Vitamin D 600-400 MG-UNIT per tablet Take 1 tablet by mouth daily.      Marland Kitchen levothyroxine (SYNTHROID, LEVOTHROID) 75 MCG tablet Take 1 tablet (75 mcg total) by mouth daily.  90 tablet  4  . losartan-hydrochlorothiazide (HYZAAR) 50-12.5 MG per tablet Take 1 tablet by mouth daily.  90 tablet  4  . meloxicam (MOBIC) 15 MG tablet Take 1 tablet (15 mg total) by mouth daily.  90 tablet  4  . metFORMIN (GLUCOPHAGE) 500 MG tablet Take 1 tablet (500 mg total) by mouth 2 (two) times daily with a meal.  180 tablet  4   BP 132/90   Pulse 100  Temp 98.6 F (37 C) (Oral)  Resp 16  Ht 5' 7.5" (1.715 m)  Wt 208 lb (94.348 kg)  BMI 32.10 kg/m2  SpO2 97%  Review of Systems  Constitutional: Negative for fever, chills, appetite change, fatigue and unexpected weight change.  HENT: Negative for ear pain, congestion, sore throat, trouble swallowing, neck pain, voice change and sinus pressure.   Eyes: Negative for visual disturbance.  Respiratory: Positive for cough. Negative for shortness of breath, wheezing and stridor.   Cardiovascular: Positive for chest pain (right lower chest wall, pleuritic). Negative for palpitations and leg swelling.  Gastrointestinal: Negative for nausea, vomiting, abdominal pain, diarrhea, constipation, blood in stool, abdominal distention and anal bleeding.  Genitourinary: Negative for dysuria and flank pain.  Musculoskeletal: Positive for arthralgias (left ankle). Negative for myalgias and gait problem.  Skin: Negative for color change and rash.  Neurological: Negative for dizziness and headaches.  Hematological: Negative for adenopathy. Does not bruise/bleed easily.  Psychiatric/Behavioral: Negative for suicidal ideas, sleep disturbance and dysphoric mood. The patient is not nervous/anxious.        Objective:   Physical Exam  Constitutional: She is oriented to person, place, and time. She appears well-developed and well-nourished. No distress.  HENT:  Head: Normocephalic and atraumatic.  Right Ear: External ear normal.  Left Ear: External ear normal.  Nose: Nose normal.  Mouth/Throat: Oropharynx is  clear and moist. No oropharyngeal exudate.  Eyes: Conjunctivae normal are normal. Pupils are equal, round, and reactive to light. Right eye exhibits no discharge. Left eye exhibits no discharge. No scleral icterus.  Neck: Normal range of motion. Neck supple. No tracheal deviation present. No thyromegaly present.  Cardiovascular: Normal rate, regular rhythm, normal heart sounds and intact  distal pulses.  Exam reveals no gallop and no friction rub.   No murmur heard. Pulmonary/Chest: Effort normal. No accessory muscle usage. Not tachypneic. No respiratory distress. She has no decreased breath sounds. She has no wheezes. She has no rhonchi. She has rales in the right lower field. She exhibits no tenderness.  Musculoskeletal: Normal range of motion. She exhibits no edema and no tenderness.       Left foot: She exhibits normal range of motion, no tenderness, no bony tenderness and no deformity.  Lymphadenopathy:    She has no cervical adenopathy.  Neurological: She is alert and oriented to person, place, and time. No cranial nerve deficit. She exhibits normal muscle tone. Coordination normal.  Skin: Skin is warm and dry. No rash noted. She is not diaphoretic. No erythema. No pallor.  Psychiatric: She has a normal mood and affect. Her behavior is normal. Judgment and thought content normal.          Assessment & Plan:

## 2012-10-18 NOTE — Assessment & Plan Note (Signed)
Symptoms are most consistent with sprain. Encouraged use of a supportive brace. Will continue meloxicam. Ice for 20 minutes 3-4 times per day as needed. Patient will call or return to clinic if symptoms are persistent.

## 2012-10-18 NOTE — Assessment & Plan Note (Signed)
Recent TSH normal. Will continue levothyroxine.

## 2012-10-19 ENCOUNTER — Ambulatory Visit: Payer: Self-pay | Admitting: Internal Medicine

## 2012-10-19 IMAGING — CT CT CHEST W/O CM
1 of 2 series · 14 of 32 positions shown, 18 images · non-contrast
Comparison: None

REASON FOR EXAM: Chest Pain Smoker Eval Mass
COMMENTS:

PROCEDURE:     KCT - KCT CHEST WITHOUT CONTRAST  - [DATE]  [DATE]
RESULT:     Indication: Chest pain, smoker
TECHNIQUE: Multiple axial images of the chest are obtained without
intravenous contrast.

[Series 2: chest w/o 3.0 i31f 2 · axial · non-contrast · 0.75mm/px · z∈[-537,-309]mm · 14 of 90 slices shown, 18 images]
[im 7/90  mediastinal]
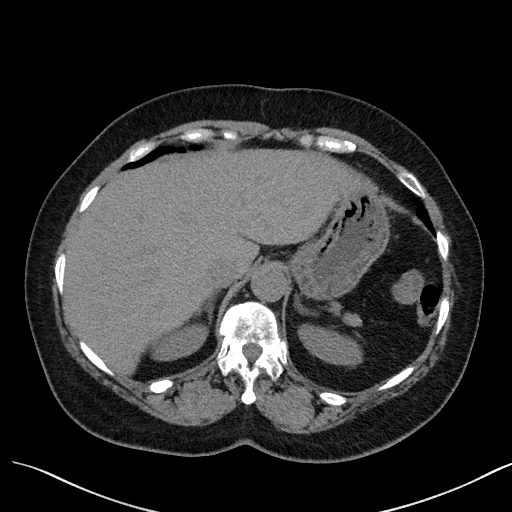
[im 7/90  lung]
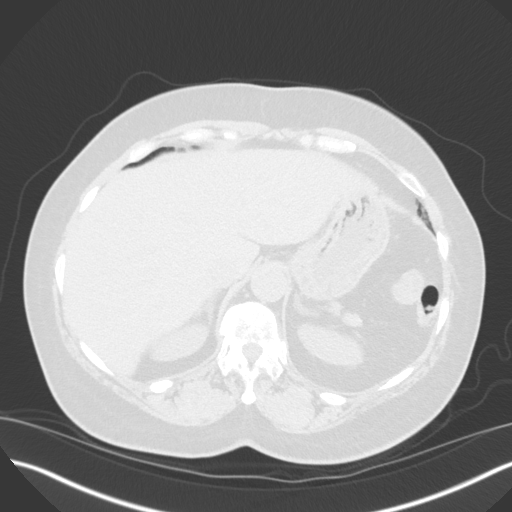
[im 14/90  lung]
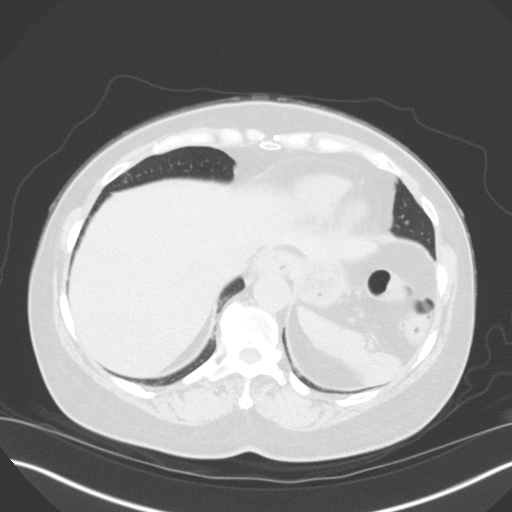
[im 21/90  lung]
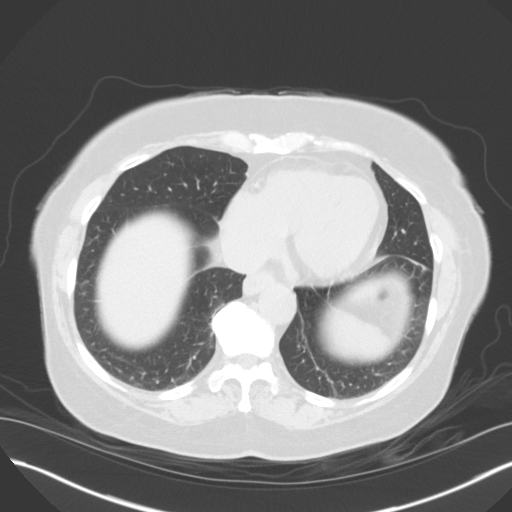
[im 28/90  lung]
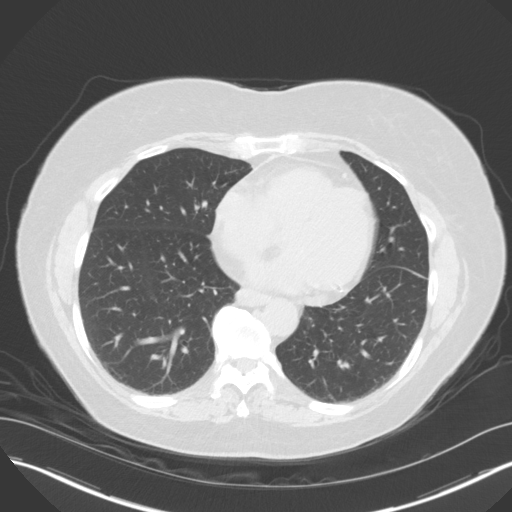
[im 35/90  mediastinal]
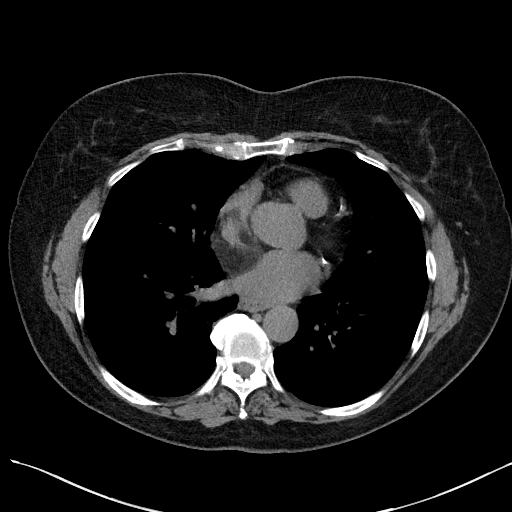
[im 35/90  lung]
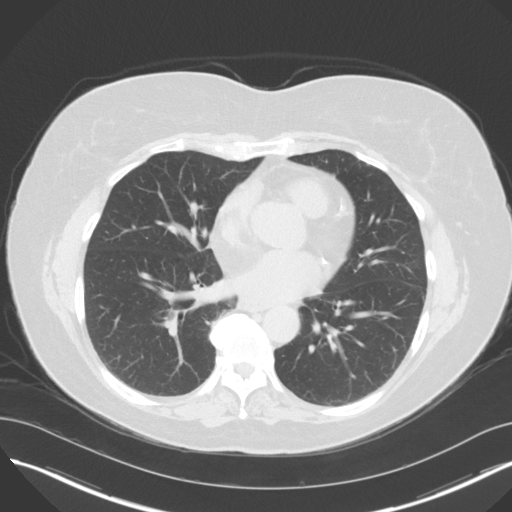
[im 42/90  lung]
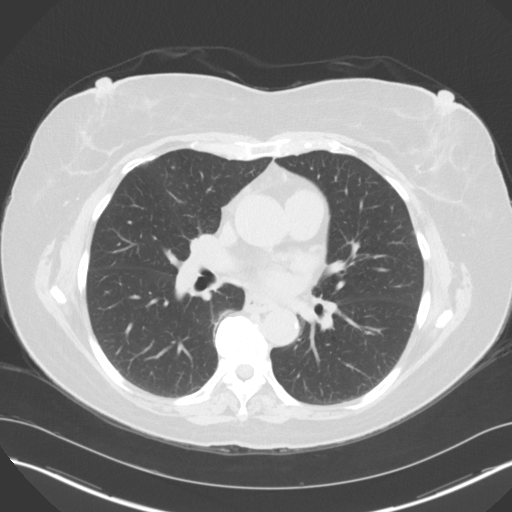
[im 43/90  lung]
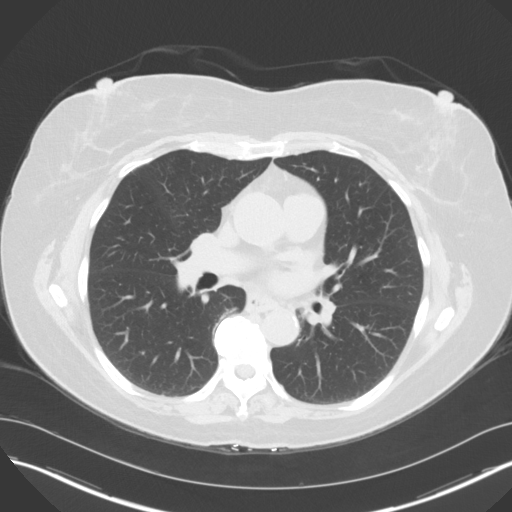
[im 45/90  lung]
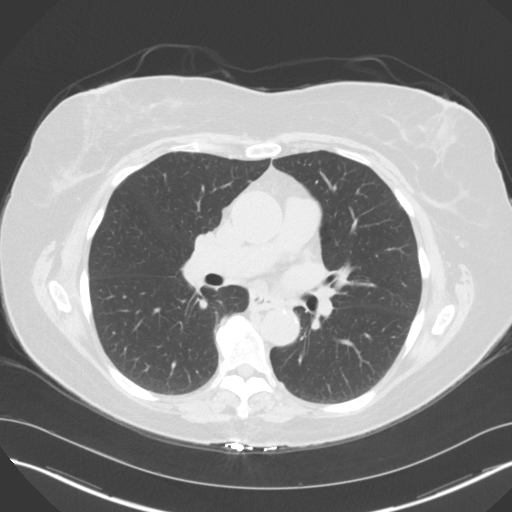
[im 48/90  mediastinal]
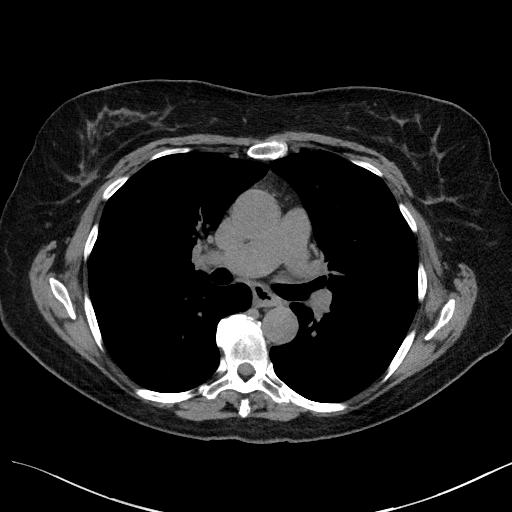
[im 48/90  lung]
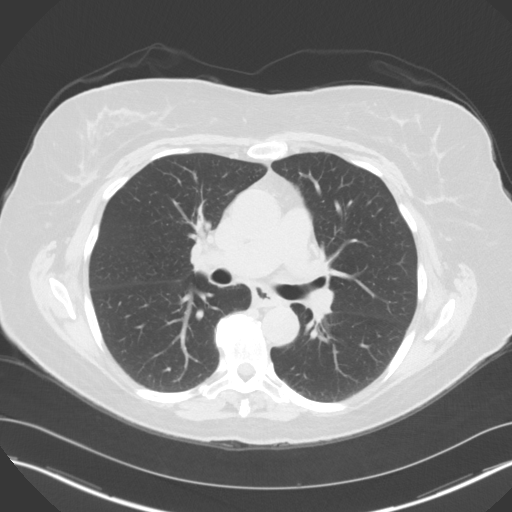
[im 55/90  lung]
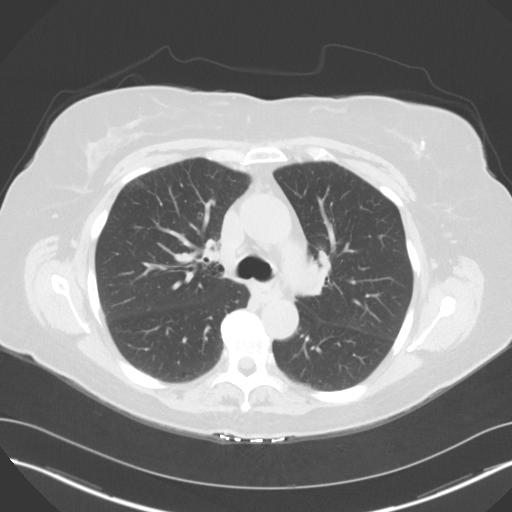
[im 62/90  lung]
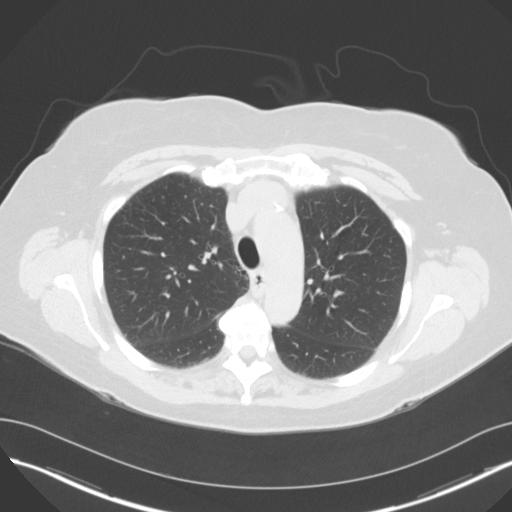
[im 69/90  lung]
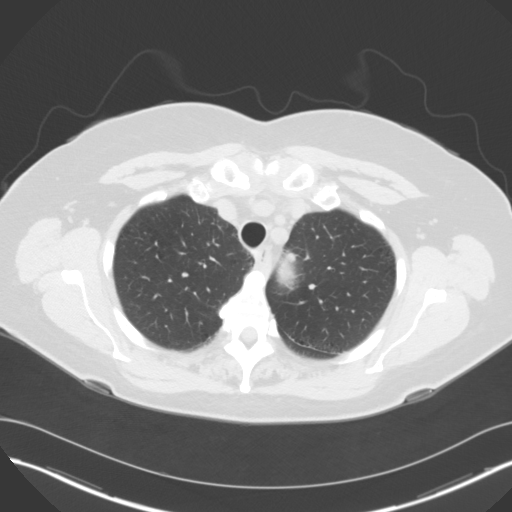
[im 76/90  mediastinal]
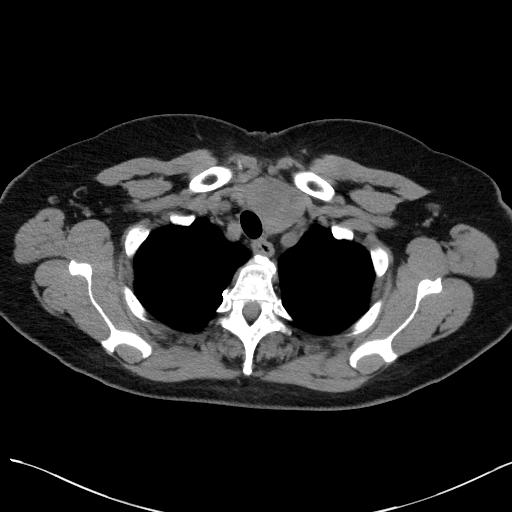
[im 76/90  lung]
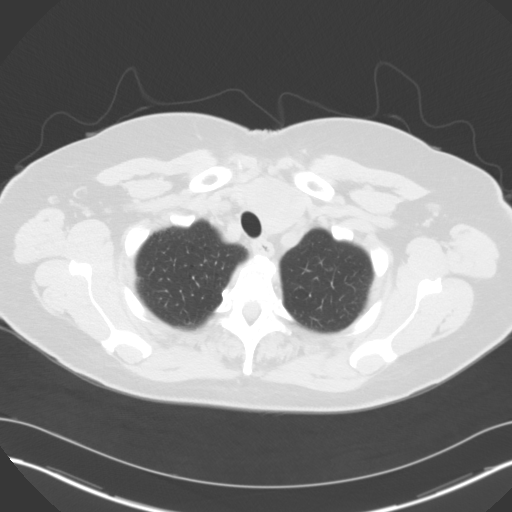
[im 83/90  lung]
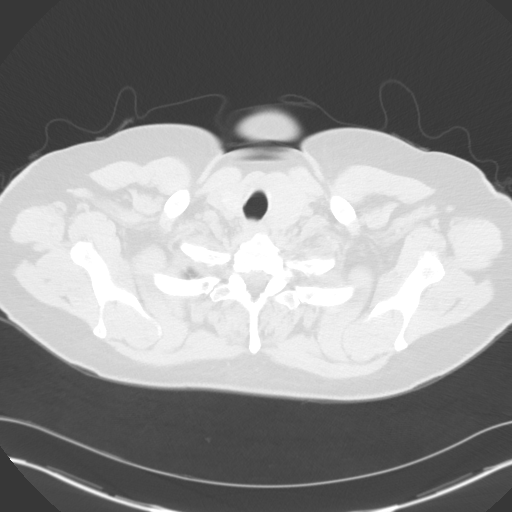

[14 of 32 positions shown; findings below may reference images not displayed]

FINDINGS: The central airways are patent. There is a 9 mm noncalcified, indeterminate
left upper lobe pulmonary nodule on image 21 of the lung windows. There is
no focal consolidation. There is no pleural effusion or pneumothorax.

There are no pathologically enlarged axillary, hilar, or mediastinal lymph
nodes.

The heart size is normal. There is no pericardial effusion. The thoracic
aorta is normal in caliber.  There is coronary artery atherosclerosis
involving the LAD and circumflex coronary artery.

Review of bone windows demonstrates no focal lytic or sclerotic lesions.

The left thyroid gland is heterogeneously enlarged.

Limited noncontrast images of the upper abdomen were obtained. The adrenal
glands appear normal. The remainder of the visualized abdominal organs are
unremarkable.
IMPRESSION: 1. There is an indeterminate 9 mm left upper lobe pulmonary nodule.
Differential diagnosis includes an infectious or inflammatory etiology
versus malignancy. Recommend thoracic surgery consultation.

2. Coronary artery disease.

3. Left thyromegaly. If there is further clinical concern recommend a
dedicated thyroid ultrasound for better characterization.

[REDACTED]

## 2012-10-19 NOTE — Telephone Encounter (Signed)
Left message asking patient to call back

## 2012-10-22 ENCOUNTER — Telehealth: Payer: Self-pay | Admitting: Internal Medicine

## 2012-10-22 NOTE — Telephone Encounter (Signed)
Left message on answering machine for pt to call back to discuss CT results. Did not leave results on answering machine. CT chest shows left upper lobe nodule 101mm, recommend referral to thoracic surgery for evaluation and possible biopsy.

## 2012-10-23 ENCOUNTER — Telehealth: Payer: Self-pay | Admitting: Internal Medicine

## 2012-10-23 DIAGNOSIS — R911 Solitary pulmonary nodule: Secondary | ICD-10-CM

## 2012-10-23 NOTE — Telephone Encounter (Signed)
Called pt with results of CT scan. She is okay to proceed with thoracic surgery eval.

## 2012-10-24 NOTE — Telephone Encounter (Signed)
Spoke with patient and she stated she does not have any drug allergies at all.

## 2012-10-25 ENCOUNTER — Ambulatory Visit: Payer: Self-pay | Admitting: Cardiothoracic Surgery

## 2012-10-29 ENCOUNTER — Encounter: Payer: Self-pay | Admitting: Internal Medicine

## 2012-10-30 ENCOUNTER — Ambulatory Visit: Payer: Self-pay

## 2012-11-02 ENCOUNTER — Ambulatory Visit: Payer: Medicare Other | Admitting: Internal Medicine

## 2012-11-06 ENCOUNTER — Ambulatory Visit: Payer: Self-pay

## 2012-11-06 IMAGING — CT NM PET TUM IMG LTD AREA
5 series · 23 of 25 positions shown · non-contrast
Comparison: none

REASON FOR EXAM: lung nodule
COMMENTS:

[Series 3: ct wb 3.0 b30f · axial · 3.0mm · 0.98mm/px · z∈[-1329,-461]mm · 10 of 435 slices shown]
[im 1/435  soft-tissue]
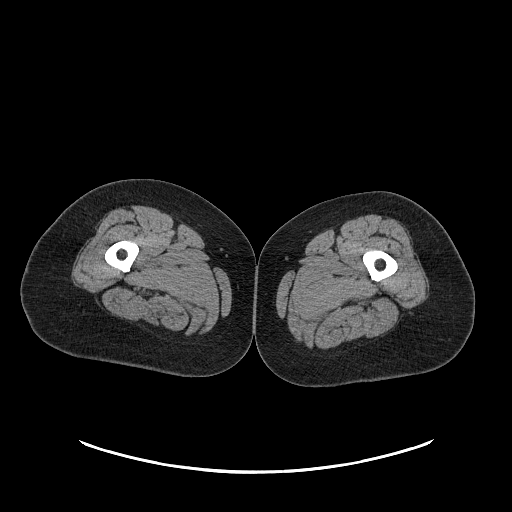
[im 44/435  soft-tissue]
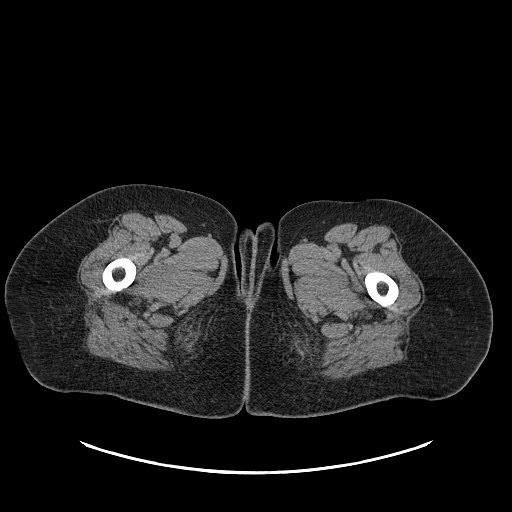
[im 87/435  soft-tissue]
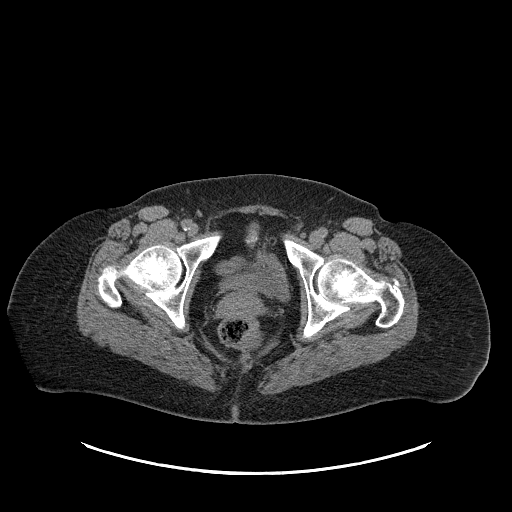
[im 131/435  soft-tissue]
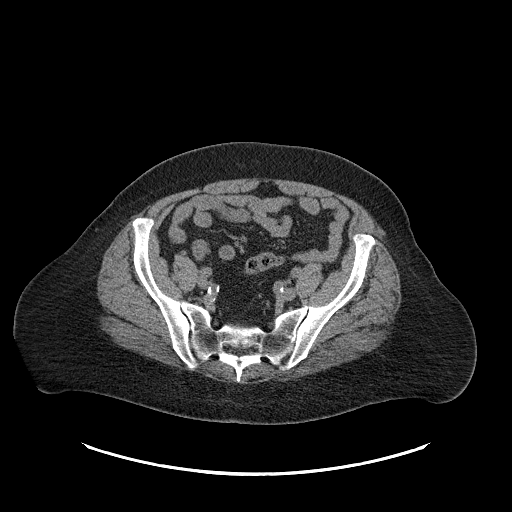
[im 174/435  soft-tissue]
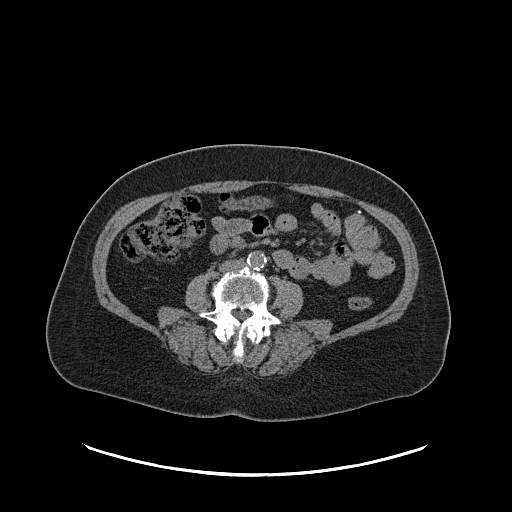
[im 218/435  soft-tissue]
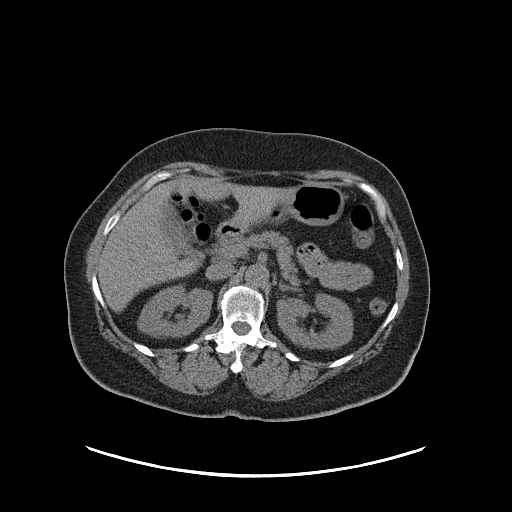
[im 304/435  soft-tissue]
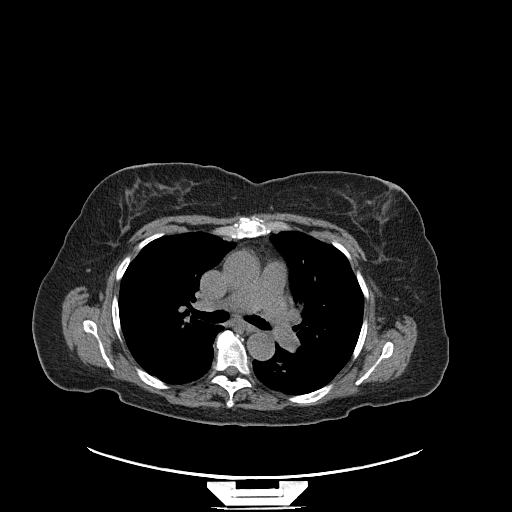
[im 348/435  soft-tissue]
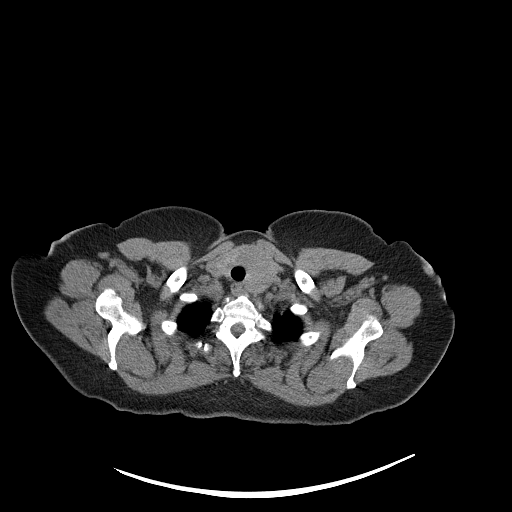
[im 391/435  soft-tissue]
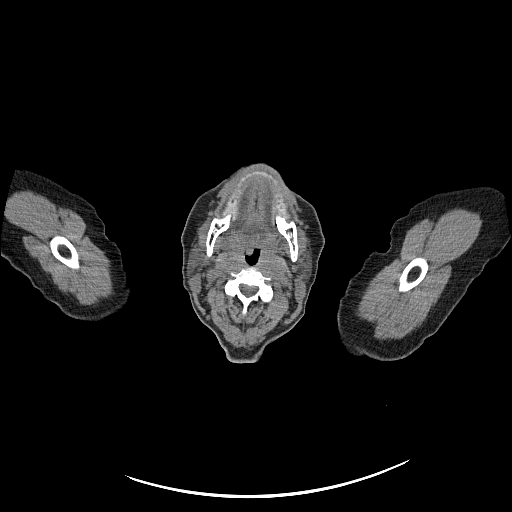
[im 435/435  soft-tissue]
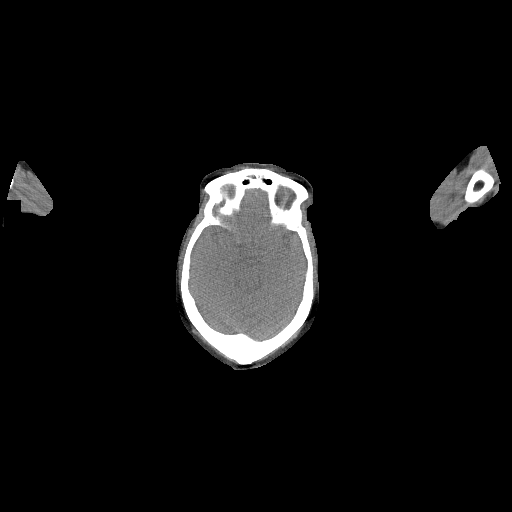

[Series 102: pet wb · axial · 5.0mm · 4.07mm/px · z∈[-1328,-461]mm · 7 of 290 slices shown]
[im 1/290]
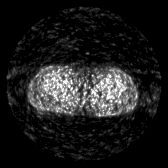
[im 49/290]
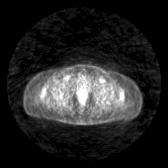
[im 97/290]
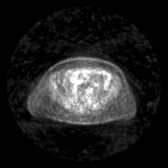
[im 145/290]
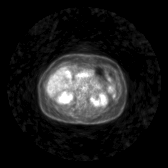
[im 193/290]
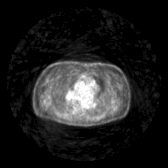
[im 241/290]
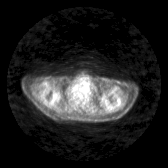
[im 290/290]
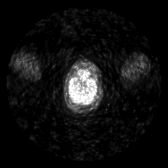

[Series 603: pet axial · 3 of 170 slices shown]
[im 57/170]
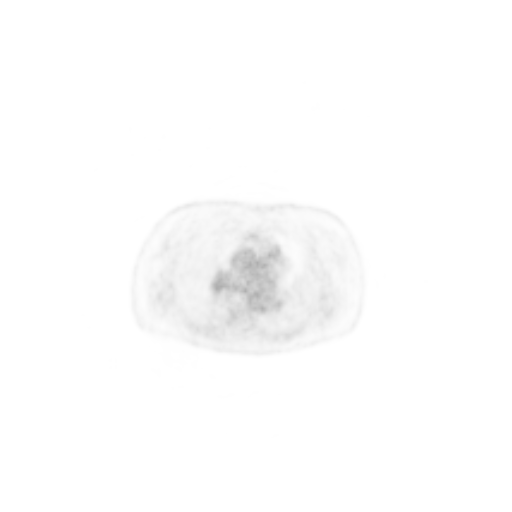
[im 113/170]
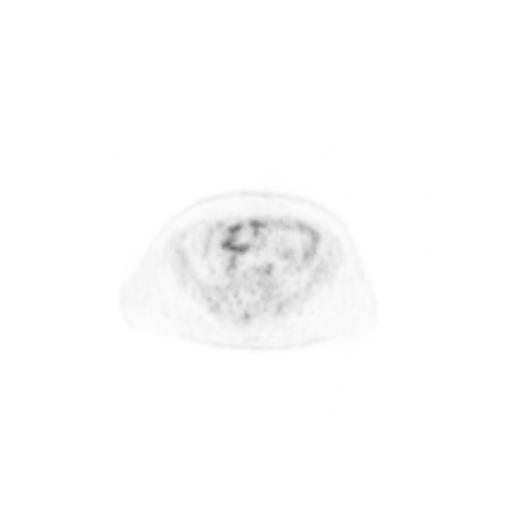
[im 170/170]
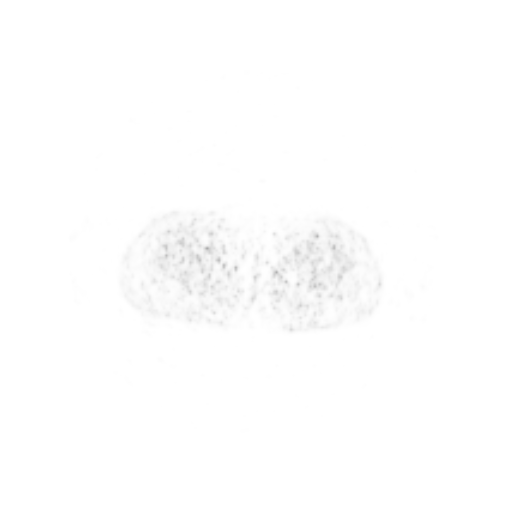

[Series 604: pet coronal · 1 of 63 slices shown]
[im 1/63]
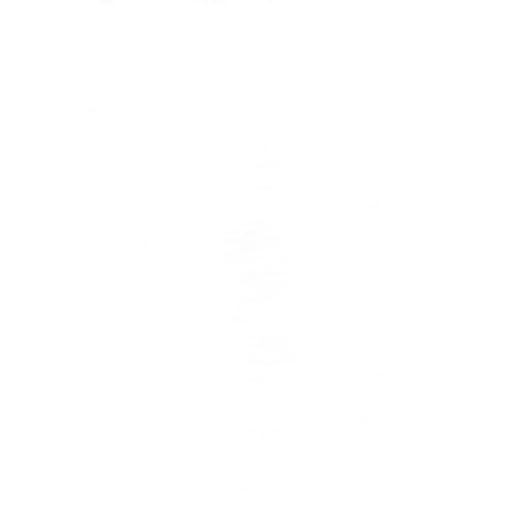

[Series 605: pet sagittal · 2 of 95 slices shown]
[im 1/95]
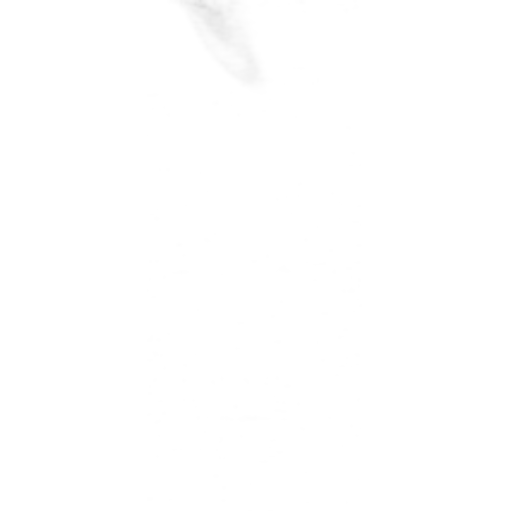
[im 95/95]
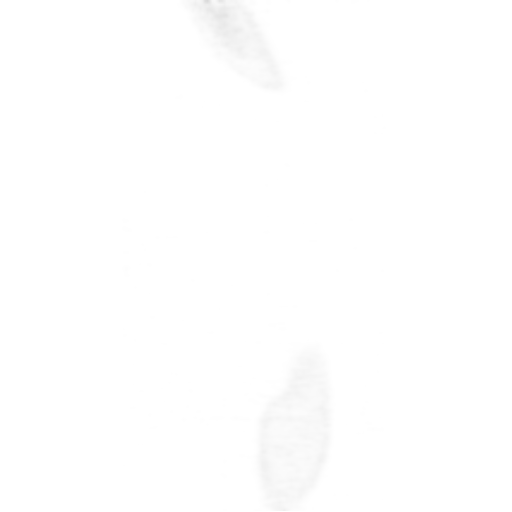

[23 of 25 positions shown; findings below may reference images not displayed]

PROCEDURE:     PET - PET/CT DX LUNG CA  - [DATE]  [DATE]

RESULT:     The patient is undergoing staging of presumed lung
malignancysecondary to a 9 mm diameter nodule in the left upper lobe. The
patient's fasting blood glucose level was 93 mg/dL. The patient received
12.4 mCi of F-18 labeled FDG at [DATE] a.m. with scanning beginning at [DATE]
a.m. Delayed imaging was begun at [DATE] a.m. The patient had difficulty
during the study due to left hip discomfort. A noncontrast CT scan was
performed at the same sitting for coregistration and attenuation correction.

Uptake of the radiopharmaceutical within the skull base and upper neck is
normal with the exception of symmetrically increased tonsillar activity.
There is intensely increased activity associated with the enlarged left
thyroid lobe. It exhibits maximal SUV of 13.5 with a mean of 9.4. On delayed
images it rises to 14.8 with a mean of 10. Within the thorax no abnormal
uptake is demonstrated within the lung parenchyma nor in the mediastinum or
hilar regions. Specific attention to the area of nodularity in the left
upper lobe on the CT scan reveals no abnormal uptake.
There is no abnormal uptake in the axillary regions.

Within the abdomen normally expected uptake within the urinary tracts is
present. There is uptake within the urinary bladder which is within the
limits of normal. No abnormal uptake within the left hip is demonstrated.

On the CT images within the abdomen no kidney stones or gallstones are
evident. The caliber of the abdominal aorta is normal. There is no evidence
of ascites.
IMPRESSION: 1. No abnormal uptake within the subcentimeter nodule in the left upper lobe
is demonstrated. No other abnormal uptake within the thorax is demonstrated.
2. There is diffusely increased uptake within the enlarged left thyroid
lobe. It measures 4.3 cm AP x 3.5 cm transversely x 4.8 cm in superior to
inferior dimension.
3. A small amount of tonsillar activity is visible bilaterally which is
likely physiologic.
4. Within the abdomen and pelvis no suspicious activity is demonstrated.

Further workup of the patient's thyroid gland is needed unless this has
already been performed. In addition a followup chest CT scan without
contrast in approximately 3 to 4 months is recommended to assure stability
of the left upper lobe nodule. Given its subcentimeter size, the sensitivity
of the PET/CT technique is limited.

[REDACTED]

## 2012-11-06 IMAGING — CT NM PET TUM IMG LTD AREA
1 of 6 series · 1 of 25 positions shown · non-contrast
Comparison: none

REASON FOR EXAM: lung nodule
COMMENTS:

[Series 102: pet lung · axial · 5.0mm · 4.07mm/px · 1 of 94 slices shown]
[im 47/94]
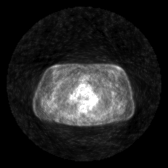

[1 of 25 positions shown; findings below may reference images not displayed]

PROCEDURE:     PET - PET/CT DX LUNG CA  - [DATE]  [DATE]

RESULT:     The patient is undergoing staging of presumed lung
malignancysecondary to a 9 mm diameter nodule in the left upper lobe. The
patient's fasting blood glucose level was 93 mg/dL. The patient received
12.4 mCi of F-18 labeled FDG at [DATE] a.m. with scanning beginning at [DATE]
a.m. Delayed imaging was begun at [DATE] a.m. The patient had difficulty
during the study due to left hip discomfort. A noncontrast CT scan was
performed at the same sitting for coregistration and attenuation correction.

Uptake of the radiopharmaceutical within the skull base and upper neck is
normal with the exception of symmetrically increased tonsillar activity.
There is intensely increased activity associated with the enlarged left
thyroid lobe. It exhibits maximal SUV of 13.5 with a mean of 9.4. On delayed
images it rises to 14.8 with a mean of 10. Within the thorax no abnormal
uptake is demonstrated within the lung parenchyma nor in the mediastinum or
hilar regions. Specific attention to the area of nodularity in the left
upper lobe on the CT scan reveals no abnormal uptake.
There is no abnormal uptake in the axillary regions.

Within the abdomen normally expected uptake within the urinary tracts is
present. There is uptake within the urinary bladder which is within the
limits of normal. No abnormal uptake within the left hip is demonstrated.

On the CT images within the abdomen no kidney stones or gallstones are
evident. The caliber of the abdominal aorta is normal. There is no evidence
of ascites.
IMPRESSION: 1. No abnormal uptake within the subcentimeter nodule in the left upper lobe
is demonstrated. No other abnormal uptake within the thorax is demonstrated.
2. There is diffusely increased uptake within the enlarged left thyroid
lobe. It measures 4.3 cm AP x 3.5 cm transversely x 4.8 cm in superior to
inferior dimension.
3. A small amount of tonsillar activity is visible bilaterally which is
likely physiologic.
4. Within the abdomen and pelvis no suspicious activity is demonstrated.

Further workup of the patient's thyroid gland is needed unless this has
already been performed. In addition a followup chest CT scan without
contrast in approximately 3 to 4 months is recommended to assure stability
of the left upper lobe nodule. Given its subcentimeter size, the sensitivity
of the PET/CT technique is limited.

[REDACTED]

## 2012-11-07 ENCOUNTER — Ambulatory Visit: Payer: Self-pay | Admitting: Cardiothoracic Surgery

## 2012-11-28 ENCOUNTER — Ambulatory Visit (INDEPENDENT_AMBULATORY_CARE_PROVIDER_SITE_OTHER): Payer: Medicare Other | Admitting: Internal Medicine

## 2012-11-28 ENCOUNTER — Encounter: Payer: Self-pay | Admitting: Internal Medicine

## 2012-11-28 VITALS — BP 110/76 | HR 91 | Temp 98.3°F | Wt 213.0 lb

## 2012-11-28 DIAGNOSIS — E119 Type 2 diabetes mellitus without complications: Secondary | ICD-10-CM

## 2012-11-28 DIAGNOSIS — R9389 Abnormal findings on diagnostic imaging of other specified body structures: Secondary | ICD-10-CM

## 2012-11-28 DIAGNOSIS — I10 Essential (primary) hypertension: Secondary | ICD-10-CM

## 2012-11-28 DIAGNOSIS — E010 Iodine-deficiency related diffuse (endemic) goiter: Secondary | ICD-10-CM | POA: Insufficient documentation

## 2012-11-28 DIAGNOSIS — E049 Nontoxic goiter, unspecified: Secondary | ICD-10-CM

## 2012-11-28 LAB — HEMOGLOBIN A1C: Hgb A1c MFr Bld: 6.2 % (ref 4.6–6.5)

## 2012-11-28 LAB — COMPREHENSIVE METABOLIC PANEL
ALT: 29 U/L (ref 0–35)
Alkaline Phosphatase: 63 U/L (ref 39–117)
Sodium: 137 mEq/L (ref 135–145)
Total Bilirubin: 0.9 mg/dL (ref 0.3–1.2)
Total Protein: 7.5 g/dL (ref 6.0–8.3)

## 2012-11-28 NOTE — Progress Notes (Signed)
Subjective:    Patient ID: Cynthia [name], female    DOB: 27-Nov-1945, 67 y.o.   MRN: [id number]  HPI 67 year old female with history of diabetes, hypertension, hypothyroidism, hyperlipidemia presents for followup. At her last visit, we sent her for CT chest to screen for lung cancer. She was noted to have a 9 mm nodule in the left upper lobe. She was referred to thoracic surgery and reports that PET/CT was performed. She reports this is normal. Decision was made to continue to observe lesion for now with serial scanning. She was also incidentally noted to have enlarged thyroid. She was sent to ENT physician and notes that plan is for thyroidectomy next month. She reports she is generally feeling well. She is compliant with her medications. She reports that sugars have been well-controlled but did not bring record of her blood sugars with her today.  Outpatient Encounter Prescriptions as of 11/28/2012  Medication Sig Dispense Refill  . amLODipine (NORVASC) 10 MG tablet Take 1 tablet (10 mg total) by mouth daily.  90 tablet  4  . aspirin 81 MG tablet Take 81 mg by mouth daily.      Marland Kitchen atorvastatin (LIPITOR) 20 MG tablet Take 1 tablet (20 mg total) by mouth daily.  90 tablet  4  . Calcium Carbonate-Vitamin D 600-400 MG-UNIT per tablet Take 1 tablet by mouth daily.      Marland Kitchen levothyroxine (SYNTHROID, LEVOTHROID) 75 MCG tablet Take 1 tablet (75 mcg total) by mouth daily.  90 tablet  4  . losartan-hydrochlorothiazide (HYZAAR) 50-12.5 MG per tablet Take 1 tablet by mouth daily.  90 tablet  4  . meloxicam (MOBIC) 15 MG tablet Take 1 tablet (15 mg total) by mouth daily.  90 tablet  4  . metFORMIN (GLUCOPHAGE) 500 MG tablet Take 1 tablet (500 mg total) by mouth 2 (two) times daily with a meal.  180 tablet  4   No facility-administered encounter medications on file as of 11/28/2012.   BP 110/76  Pulse 91  Temp(Src) 98.3 F (36.8 C) (Oral)  Wt 213 lb (96.616 kg)  BMI 32.85 kg/m2  SpO2 98%  Review of  Systems  Constitutional: Negative for fever, chills, appetite change, fatigue and unexpected weight change.  HENT: Negative for ear pain, congestion, sore throat, trouble swallowing, neck pain, voice change and sinus pressure.   Eyes: Negative for visual disturbance.  Respiratory: Negative for cough, shortness of breath, wheezing and stridor.   Cardiovascular: Negative for chest pain, palpitations and leg swelling.  Gastrointestinal: Negative for nausea, vomiting, abdominal pain, diarrhea, constipation, blood in stool, abdominal distention and anal bleeding.  Genitourinary: Negative for dysuria and flank pain.  Musculoskeletal: Negative for myalgias, arthralgias and gait problem.  Skin: Negative for color change and rash.  Neurological: Negative for dizziness and headaches.  Hematological: Negative for adenopathy. Does not bruise/bleed easily.  Psychiatric/Behavioral: Negative for suicidal ideas, sleep disturbance and dysphoric mood. The patient is not nervous/anxious.        Objective:   Physical Exam  Constitutional: She is oriented to person, place, and time. She appears well-developed and well-nourished. No distress.  HENT:  Head: Normocephalic and atraumatic.  Right Ear: External ear normal.  Left Ear: External ear normal.  Nose: Nose normal.  Mouth/Throat: Oropharynx is clear and moist. No oropharyngeal exudate.  Eyes: Conjunctivae are normal. Pupils are equal, round, and reactive to light. Right eye exhibits no discharge. Left eye exhibits no discharge. No scleral icterus.  Neck: Normal range of motion. Neck  supple. No tracheal deviation present. Thyromegaly present.  Cardiovascular: Normal rate, regular rhythm, normal heart sounds and intact distal pulses.  Exam reveals no gallop and no friction rub.   No murmur heard. Pulmonary/Chest: Effort normal and breath sounds normal. No respiratory distress. She has no wheezes. She has no rales. She exhibits no tenderness.   Musculoskeletal: Normal range of motion. She exhibits no edema and no tenderness.  Lymphadenopathy:    She has no cervical adenopathy.  Neurological: She is alert and oriented to person, place, and time. No cranial nerve deficit. She exhibits normal muscle tone. Coordination normal.  Skin: Skin is warm and dry. No rash noted. She is not diaphoretic. No erythema. No pallor.  Psychiatric: She has a normal mood and affect. Her behavior is normal. Judgment and thought content normal.          Assessment & Plan:

## 2012-11-28 NOTE — Assessment & Plan Note (Signed)
Patient was evaluated by thoracic surgeon. Reports that plan is to follow with repeat CT in a couple of months. Notes that she had PET scan which was reportedly normal. Will request records on evaluation.

## 2012-11-28 NOTE — Assessment & Plan Note (Signed)
Patient reports blood sugars are well controlled. Will check A1c with labs today. Continue metformin.

## 2012-11-28 NOTE — Assessment & Plan Note (Signed)
Thyromegaly noted on recent CT of the chest. Patient reports she was evaluated by ENT physician and plan is for thyroidectomy next month. Will request records on evaluation.

## 2012-11-28 NOTE — Assessment & Plan Note (Signed)
BP Readings from Last 3 Encounters:  11/28/12 110/76  10/18/12 132/90   BP well controlled on current medications. Will continue.

## 2013-01-09 ENCOUNTER — Telehealth: Payer: Self-pay | Admitting: *Deleted

## 2013-01-09 ENCOUNTER — Ambulatory Visit: Payer: Self-pay | Admitting: Surgery

## 2013-01-09 LAB — BASIC METABOLIC PANEL
Calcium, Total: 9 mg/dL (ref 8.5–10.1)
Creatinine: 0.7 mg/dL (ref 0.60–1.30)
EGFR (African American): >60
Glucose: 85 mg/dL (ref 65–99)
Sodium: 138 mmol/L (ref 136–145)

## 2013-01-09 NOTE — Telephone Encounter (Signed)
Fine to send last office visit. Either we can do an EKG or they can do one at the hospital.

## 2013-01-09 NOTE — Telephone Encounter (Signed)
Diane @ [facility] left VM, requesting pt's last OV and EKG faxed to [phone], anesthesia wants to see prior to her surgery. There is no record of an EKG. Please advise.

## 2013-01-09 NOTE — Telephone Encounter (Signed)
Information faxed via Epic

## 2013-01-16 ENCOUNTER — Ambulatory Visit: Payer: Self-pay | Admitting: Surgery

## 2013-01-17 LAB — ALBUMIN: Albumin: 3.4 g/dL (ref 3.4–5.0)

## 2013-01-17 LAB — CREATININE, SERUM
Creatinine: 0.55 mg/dL — ABNORMAL LOW (ref 0.60–1.30)
EGFR (African American): >60

## 2013-01-30 ENCOUNTER — Encounter: Payer: Self-pay | Admitting: Internal Medicine

## 2013-01-30 ENCOUNTER — Ambulatory Visit (INDEPENDENT_AMBULATORY_CARE_PROVIDER_SITE_OTHER): Payer: Medicare Other | Admitting: Internal Medicine

## 2013-01-30 VITALS — BP 120/84 | HR 81 | Temp 98.3°F | Wt 222.0 lb

## 2013-01-30 DIAGNOSIS — E119 Type 2 diabetes mellitus without complications: Secondary | ICD-10-CM

## 2013-01-30 DIAGNOSIS — E010 Iodine-deficiency related diffuse (endemic) goiter: Secondary | ICD-10-CM

## 2013-01-30 DIAGNOSIS — E049 Nontoxic goiter, unspecified: Secondary | ICD-10-CM

## 2013-01-30 DIAGNOSIS — I10 Essential (primary) hypertension: Secondary | ICD-10-CM

## 2013-01-30 DIAGNOSIS — R9389 Abnormal findings on diagnostic imaging of other specified body structures: Secondary | ICD-10-CM

## 2013-01-30 DIAGNOSIS — E039 Hypothyroidism, unspecified: Secondary | ICD-10-CM

## 2013-01-30 MED ORDER — FREESTYLE LANCETS MISC: Status: DC

## 2013-01-30 MED ORDER — GLUCOSE BLOOD VI STRP: ORAL_STRIP | Status: DC

## 2013-01-30 NOTE — Assessment & Plan Note (Signed)
BG well controlled on current meds. Will recheck A1c in 02/2013.

## 2013-01-30 NOTE — Assessment & Plan Note (Signed)
BP Readings from Last 3 Encounters:  01/30/13 120/84  11/28/12 110/76  10/18/12 132/90   BP well controlled on current medications. Will continue.

## 2013-01-30 NOTE — Progress Notes (Signed)
Subjective:    Patient ID: Cynthia [name], female    DOB: 29-Jan-1946, 67 y.o.   MRN: [id number]  HPI 67 year old female with history of diabetes, hypertension, hypothyroidism presents for followup. She recently underwent thyroidectomy for thyromegaly. She reports that pathology showed normal thyroid gland. She reports she is covering well. She denies any significant pain in her neck. Steri-Strips continue to be intact to the incision site. Her dose of Synthroid was recently increased by her surgeon to 112 mcg daily.  She reports blood sugars have been well-controlled. She is compliant with her medications. She denies any new concerns today.  Outpatient Encounter Prescriptions as of 01/30/2013  Medication Sig Dispense Refill  . amLODipine (NORVASC) 10 MG tablet Take 1 tablet (10 mg total) by mouth daily.  90 tablet  4  . aspirin 81 MG tablet Take 81 mg by mouth daily.      Marland Kitchen atorvastatin (LIPITOR) 20 MG tablet Take 1 tablet (20 mg total) by mouth daily.  90 tablet  4  . calcitRIOL (ROCALTROL) 0.5 MCG capsule Take 0.5 mcg by mouth daily. Take 2 capsules daily      . calcium acetate (PHOSLO) 667 MG capsule 667 mg 3 (three) times daily with meals. Take 1 capsule at 9:30 AM and at 2 PM and at 8 PM      . Calcium Carbonate-Vitamin D 600-400 MG-UNIT per tablet Take 1 tablet by mouth daily.      Marland Kitchen HYDROcodone-acetaminophen (NORCO/VICODIN) 5-325 MG per tablet       . levothyroxine (SYNTHROID, LEVOTHROID) 112 MCG tablet Take 112 mcg by mouth daily before breakfast.      . losartan-hydrochlorothiazide (HYZAAR) 50-12.5 MG per tablet Take 1 tablet by mouth daily.  90 tablet  4  . meloxicam (MOBIC) 15 MG tablet Take 1 tablet (15 mg total) by mouth daily.  90 tablet  4  . metFORMIN (GLUCOPHAGE) 500 MG tablet Take 1 tablet (500 mg total) by mouth 2 (two) times daily with a meal.  180 tablet  4  . glucose blood test strip Use as instructed  100 each  12  . Lancets (FREESTYLE) lancets Use as instructed  100 each   12  . [DISCONTINUED] levothyroxine (SYNTHROID, LEVOTHROID) 75 MCG tablet Take 1 tablet (75 mcg total) by mouth daily.  90 tablet  4   No facility-administered encounter medications on file as of 01/30/2013.   BP 120/84  Pulse 81  Temp(Src) 98.3 F (36.8 C) (Oral)  Wt 222 lb (100.699 kg)  BMI 34.24 kg/m2  SpO2 96%  Review of Systems  Constitutional: Negative for fever, chills, appetite change, fatigue and unexpected weight change.  HENT: Negative for ear pain, congestion, sore throat, trouble swallowing, neck pain, voice change and sinus pressure.   Eyes: Negative for visual disturbance.  Respiratory: Negative for cough, shortness of breath, wheezing and stridor.   Cardiovascular: Negative for chest pain, palpitations and leg swelling.  Gastrointestinal: Negative for nausea, vomiting, abdominal pain, diarrhea, constipation, blood in stool, abdominal distention and anal bleeding.  Genitourinary: Negative for dysuria and flank pain.  Musculoskeletal: Negative for myalgias, arthralgias and gait problem.  Skin: Negative for color change and rash.  Neurological: Negative for dizziness and headaches.  Hematological: Negative for adenopathy. Does not bruise/bleed easily.  Psychiatric/Behavioral: Negative for suicidal ideas, sleep disturbance and dysphoric mood. The patient is not nervous/anxious.        Objective:   Physical Exam  Constitutional: She is oriented to person, place, and time. She appears  well-developed and well-nourished. No distress.  HENT:  Head: Normocephalic and atraumatic.  Right Ear: External ear normal.  Left Ear: External ear normal.  Nose: Nose normal.  Mouth/Throat: Oropharynx is clear and moist. No oropharyngeal exudate.  Eyes: Conjunctivae are normal. Pupils are equal, round, and reactive to light. Right eye exhibits no discharge. Left eye exhibits no discharge. No scleral icterus.  Neck: Normal range of motion. Neck supple. No tracheal deviation present. No  thyromegaly present.    Cardiovascular: Normal rate, regular rhythm, normal heart sounds and intact distal pulses.  Exam reveals no gallop and no friction rub.   No murmur heard. Pulmonary/Chest: Effort normal and breath sounds normal. No accessory muscle usage. Not tachypneic. No respiratory distress. She has no decreased breath sounds. She has no wheezes. She has no rhonchi. She has no rales. She exhibits no tenderness.  Musculoskeletal: Normal range of motion. She exhibits no edema and no tenderness.  Lymphadenopathy:    She has no cervical adenopathy.  Neurological: She is alert and oriented to person, place, and time. No cranial nerve deficit. She exhibits normal muscle tone. Coordination normal.  Skin: Skin is warm and dry. No rash noted. She is not diaphoretic. No erythema. No pallor.  Psychiatric: She has a normal mood and affect. Her behavior is normal. Judgment and thought content normal.          Assessment & Plan:

## 2013-01-30 NOTE — Assessment & Plan Note (Signed)
S/p thyroidectomy. Pt reports pathology was benign. Will request report on this. Continue Levothyroxine. Repeat TSH in 4 weeks

## 2013-01-30 NOTE — Assessment & Plan Note (Signed)
Follow up scheduled with Dr. Scherrie Bateman in thoracic oncology

## 2013-01-30 NOTE — Assessment & Plan Note (Signed)
Will check TSH with labs in 4 weeks. Continue Levothyroxine.

## 2013-02-01 ENCOUNTER — Telehealth: Payer: Self-pay | Admitting: *Deleted

## 2013-02-01 NOTE — Telephone Encounter (Signed)
Spoke with pt & advised her to take her red white & blue ins card to pharmacy so they can file her part B, her current Freestyle test strips & supplies may be covered

## 2013-02-08 ENCOUNTER — Telehealth: Payer: Self-pay | Admitting: Internal Medicine

## 2013-02-08 DIAGNOSIS — E119 Type 2 diabetes mellitus without complications: Secondary | ICD-10-CM

## 2013-02-08 MED ORDER — GLUCOSE BLOOD VI STRP: ORAL_STRIP | Status: DC

## 2013-02-08 NOTE — Telephone Encounter (Signed)
Rx sent to pharmacy   

## 2013-02-08 NOTE — Telephone Encounter (Signed)
Pt called checking on her rx for Lantis test strips Pt has been trying to get them at [facility] in [place] doesn't have the test strips Please advise pt when test strips are called Pt has been out of these for 1 week

## 2013-02-18 ENCOUNTER — Encounter: Payer: Medicare Other | Admitting: Internal Medicine

## 2013-02-20 ENCOUNTER — Ambulatory Visit: Payer: Self-pay

## 2013-02-20 IMAGING — CT CT CHEST W/O CM
1 of 2 series · 14 of 32 positions shown, 18 images · non-contrast
Comparison: none

REASON FOR EXAM: Pulmonary Mass
COMMENTS:

[Series 2: chest w/o 3.0 i31f 2 · axial · non-contrast · 0.73mm/px · z∈[-697,-424]mm · 14 of 109 slices shown, 18 images]
[im 9/109  mediastinal]
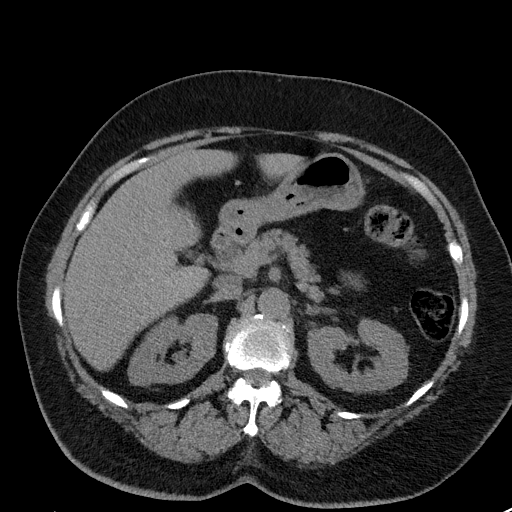
[im 9/109  lung]
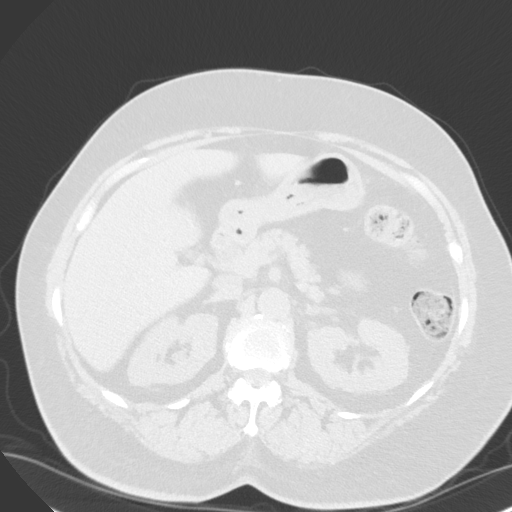
[im 17/109  lung]
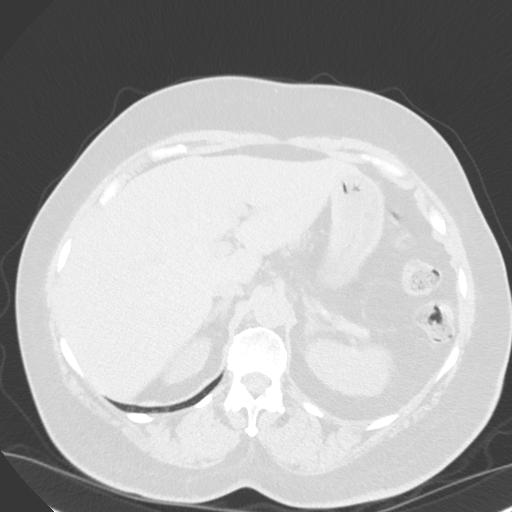
[im 25/109  lung]
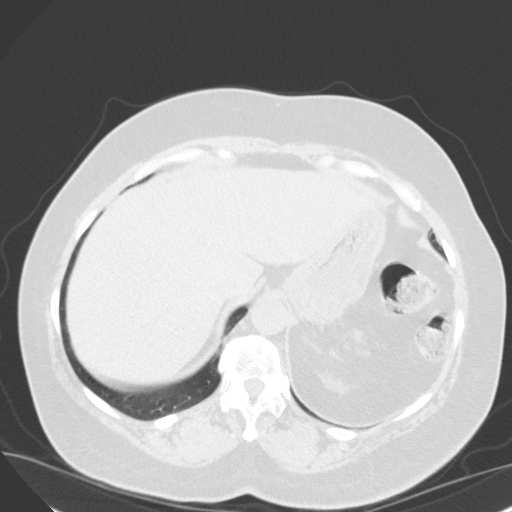
[im 34/109  lung]
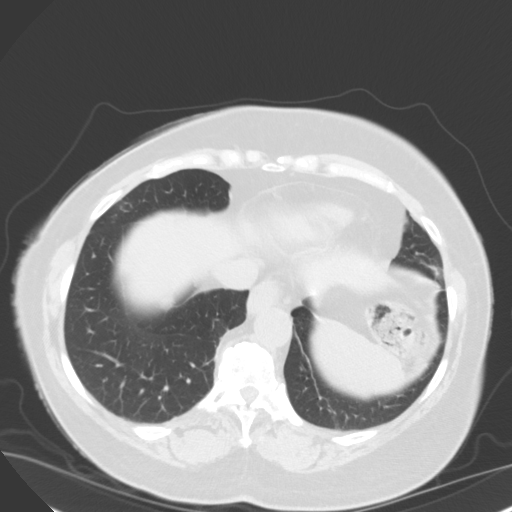
[im 42/109  mediastinal]
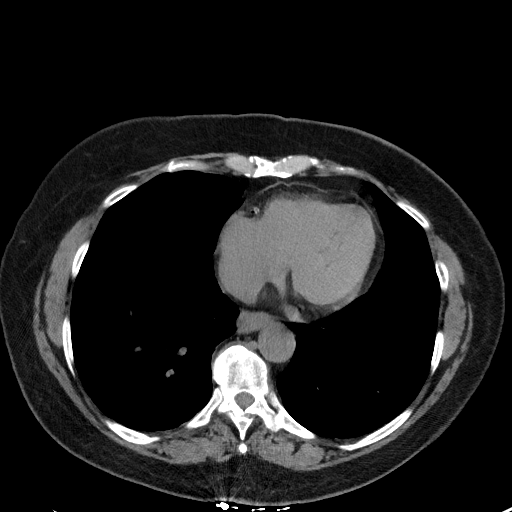
[im 42/109  lung]
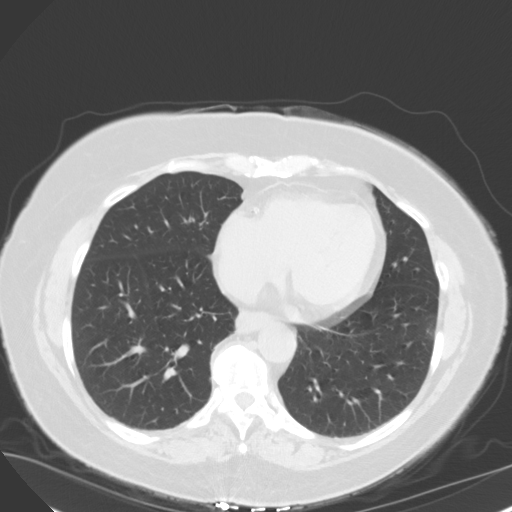
[im 50/109  lung]
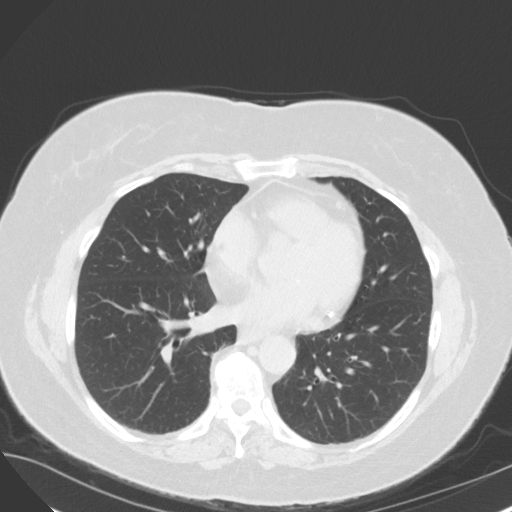
[im 52/109  lung]
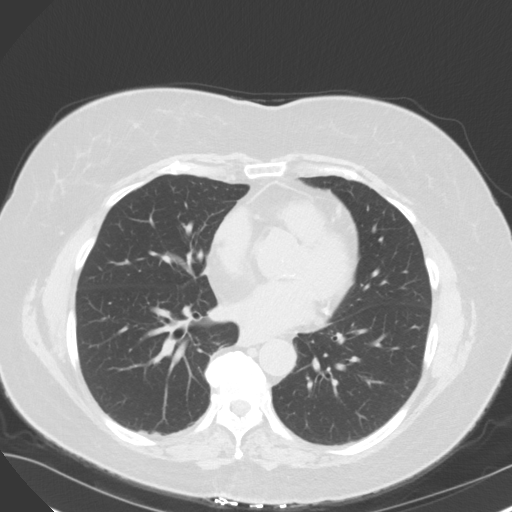
[im 55/109  lung]
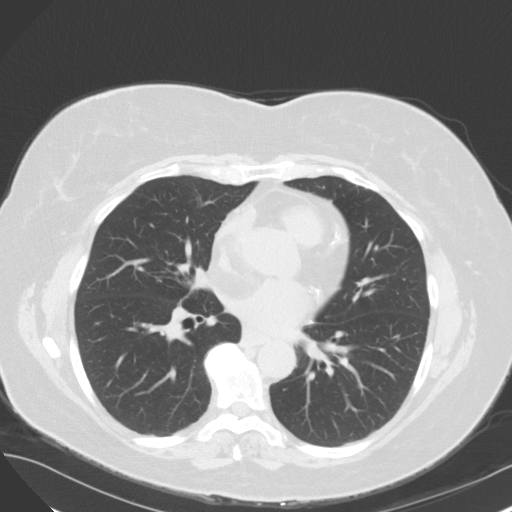
[im 59/109  mediastinal]
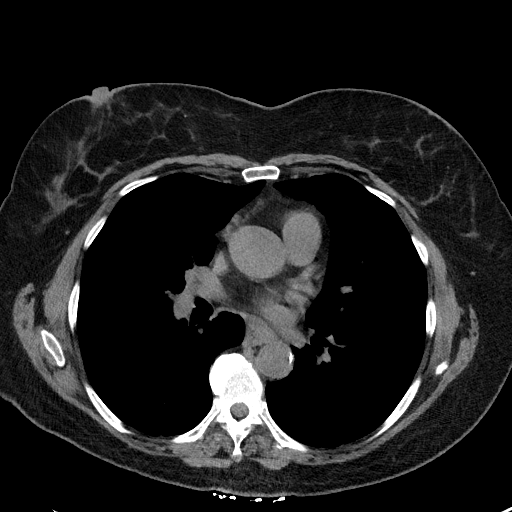
[im 59/109  lung]
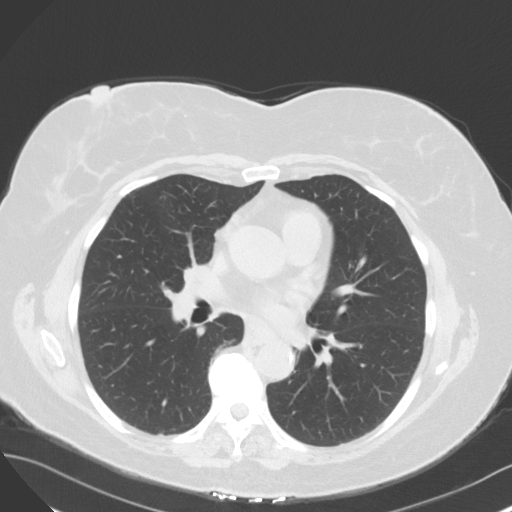
[im 67/109  lung]
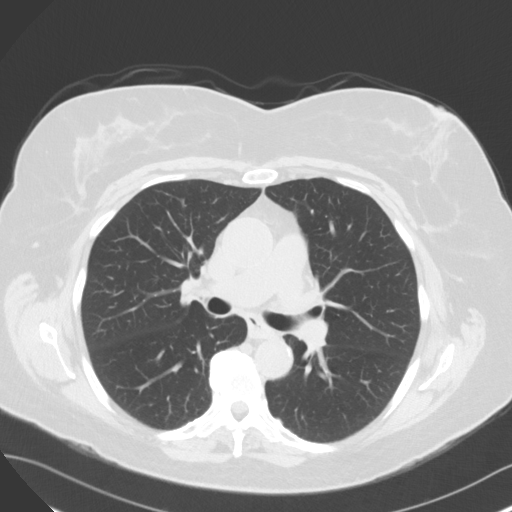
[im 75/109  lung]
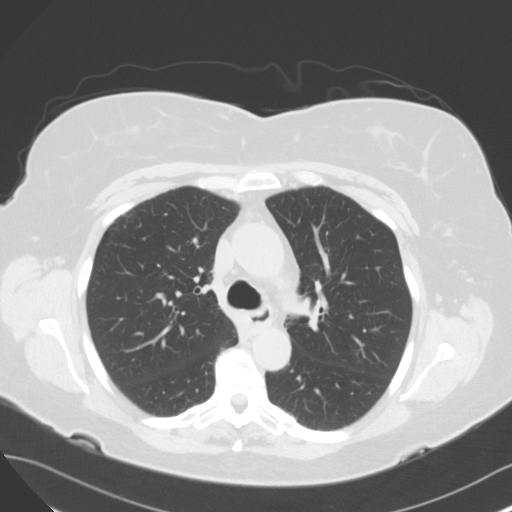
[im 84/109  lung]
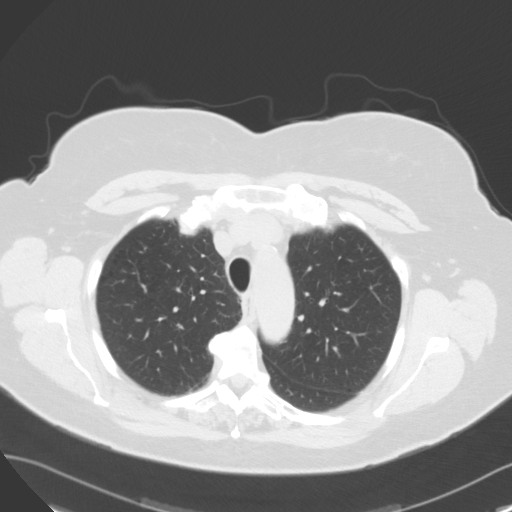
[im 92/109  mediastinal]
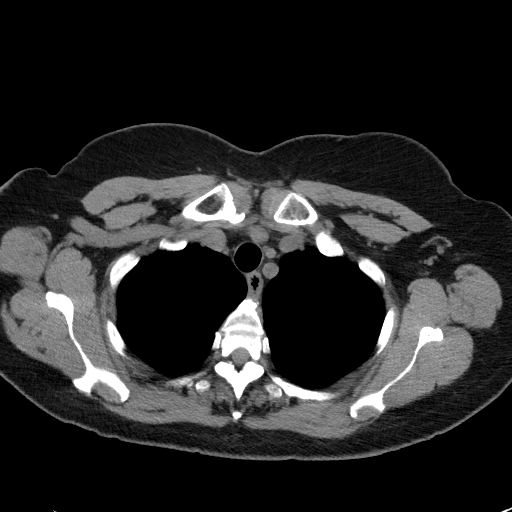
[im 92/109  lung]
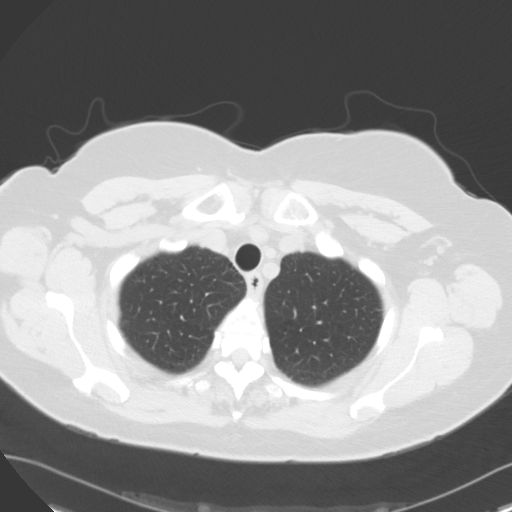
[im 100/109  lung]
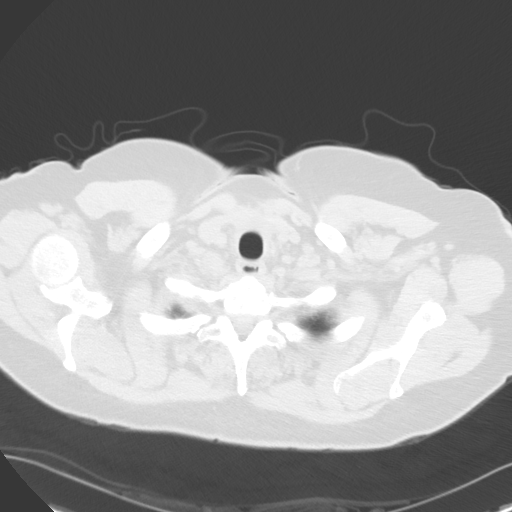

[14 of 32 positions shown; findings below may reference images not displayed]

PROCEDURE:     KCT - KCT CHEST WITHOUT CONTRAST  - [DATE]  [DATE]

RESULT:     CT of the chest without contrast is compared to previous exam
dated [DATE]. Images are reconstructed at 3 mm slice thickness in the
axial plane.

There is a 8.6 mm noncalcified fairly smoothly marginated nodular density
adjacent to the superior margin of the thoracic aortic arch on image 22
which appears to be stable compare to the previous exam. There is no new
nodular density. There is no mediastinal or hilar mass or adenopathy. There
is no pleural or pericardial effusion. The heart is normal in size. The
included upper abdominal structures appear within normal limits.
Atherosclerotic calcification is seen within the thoracic aorta and coronary
arteries. There is no aneurysm evident. There is calcification in the left
renal hilum which may be vascular on images one hundred 7 108.
Nephrolithiasis cannot be completely excluded but there does not appear to
be evidence of hydronephrosis in the included upper pole regions.
IMPRESSION: 1. Stable left upper lobe smoothly marginated nodular density without
calcification. No new nodules evident. The lungs are otherwise clear.

[REDACTED]

## 2013-02-22 ENCOUNTER — Emergency Department: Payer: Self-pay | Admitting: Emergency Medicine

## 2013-02-28 ENCOUNTER — Ambulatory Visit (INDEPENDENT_AMBULATORY_CARE_PROVIDER_SITE_OTHER): Payer: Medicare Other | Admitting: Adult Health

## 2013-02-28 ENCOUNTER — Encounter: Payer: Self-pay | Admitting: Adult Health

## 2013-02-28 DIAGNOSIS — F411 Generalized anxiety disorder: Secondary | ICD-10-CM
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
## 2013-02-28 NOTE — Patient Instructions (Addendum)
  You are doing very well.  Your blood pressure, heart rate are very good.  You will be seeing Dr. Gilford Rile on 03/08/13 at 1:30 pm for your physical.  Please bring your medications with you.
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
## 2013-03-08 ENCOUNTER — Encounter: Payer: Self-pay | Admitting: Internal Medicine

## 2013-03-08 ENCOUNTER — Ambulatory Visit (INDEPENDENT_AMBULATORY_CARE_PROVIDER_SITE_OTHER): Payer: Medicare Other | Admitting: Internal Medicine

## 2013-03-08 VITALS — BP 100/74 | HR 99 | Temp 98.5°F | Ht 67.0 in | Wt 223.0 lb

## 2013-03-08 DIAGNOSIS — M129 Arthropathy, unspecified: Secondary | ICD-10-CM

## 2013-03-08 DIAGNOSIS — E785 Hyperlipidemia, unspecified: Secondary | ICD-10-CM

## 2013-03-08 DIAGNOSIS — Z0000 Encounter for general adult medical examination without abnormal findings: Secondary | ICD-10-CM

## 2013-03-08 DIAGNOSIS — M1990 Unspecified osteoarthritis, unspecified site: Secondary | ICD-10-CM

## 2013-03-08 DIAGNOSIS — R9389 Abnormal findings on diagnostic imaging of other specified body structures: Secondary | ICD-10-CM

## 2013-03-08 DIAGNOSIS — E039 Hypothyroidism, unspecified: Secondary | ICD-10-CM

## 2013-03-08 DIAGNOSIS — E119 Type 2 diabetes mellitus without complications: Secondary | ICD-10-CM

## 2013-03-08 LAB — COMPREHENSIVE METABOLIC PANEL
ALT: 28 U/L (ref 0–35)
Albumin: 4.4 g/dL (ref 3.5–5.2)
CO2: 25 mEq/L (ref 19–32)
GFR: 83.1 mL/min (ref >60.00)
Potassium: 3.7 mEq/L (ref 3.5–5.1)
Sodium: 139 mEq/L (ref 135–145)
Total Bilirubin: 0.8 mg/dL (ref 0.3–1.2)
Total Protein: 7.8 g/dL (ref 6.0–8.3)

## 2013-03-08 MED ORDER — MELOXICAM 15 MG PO TABS: 15.0000 mg | ORAL_TABLET | Freq: Every day | ORAL | Status: DC

## 2013-03-08 MED ORDER — ATORVASTATIN CALCIUM 20 MG PO TABS: 20.0000 mg | ORAL_TABLET | Freq: Every day | ORAL | Status: DC

## 2013-03-08 NOTE — Assessment & Plan Note (Signed)
Pt reports excellent control of BG. Will check A1c with labs today. Continue metformin.

## 2013-03-08 NOTE — Progress Notes (Signed)
Subjective:    Patient ID: Cynthia [name], female    DOB: [birth year], 67 y.o.   MRN: [id number]  HPI The patient is here for annual Medicare wellness examination and management of other chronic and acute problems.   The risk factors are reflected in the social history.  The roster of all physicians providing medical care to patient - is listed in the Snapshot section of the chart.  Activities of daily living:  The patient is 100% independent in all ADLs: dressing, toileting, feeding as well as independent mobility  Home safety : The patient has smoke detectors in the home. They wear seatbelts.  There are no firearms at home. There is no violence in the home.   There is no risks for hepatitis, STDs or HIV. There is no history of blood transfusion. They have no travel history to infectious disease endemic areas of the world.  The patient has not seen their dentist in the last six month. Has dentures. They have seen their eye doctor in the last year. [facility] No issues with hearing.  They have deferred audiologic testing in the last year.   They do not  have excessive sun exposure. Discussed the need for sun protection: hats, long sleeves and use of sunscreen if there is significant sun exposure.   Diet: the importance of a healthy diet is discussed. They do have a healthy diet.  The benefits of regular aerobic exercise were discussed. She walks occasionally.  Depression screen: there are no signs or vegative symptoms of depression- irritability, change in appetite, anhedonia, sadness/tearfullness.  Cognitive assessment: the patient manages all their financial and personal affairs and is actively engaged. They could relate day,date,year and events.  The following portions of the patient's history were reviewed and updated as appropriate: allergies, current medications, past family history, past medical history,  past surgical history, past social history  and problem  list.  Visual acuity was not assessed per patient preference since she has regular follow up with her ophthalmologist. Hearing and body mass index were assessed and reviewed.   During the course of the visit the patient was educated and counseled about appropriate screening and preventive services including : fall prevention , diabetes screening, nutrition counseling, colorectal cancer screening, and recommended immunizations.    Outpatient Encounter Prescriptions as of 03/08/2013  Medication Sig Dispense Refill  . amLODipine (NORVASC) 10 MG tablet Take 1 tablet (10 mg total) by mouth daily.  90 tablet  4  . aspirin 81 MG tablet Take 81 mg by mouth daily.      Marland Kitchen atorvastatin (LIPITOR) 20 MG tablet Take 1 tablet (20 mg total) by mouth daily.  90 tablet  4  . calcitRIOL (ROCALTROL) 0.5 MCG capsule Take 0.5 mcg by mouth daily. Take 2 capsules daily      . calcium acetate (PHOSLO) 667 MG capsule 667 mg 3 (three) times daily with meals. Take 1 capsule at 9:30 AM and at 2 PM and at 8 PM      . Calcium Carbonate-Vitamin D 600-400 MG-UNIT per tablet Take 1 tablet by mouth daily.      Marland Kitchen glucose blood test strip Use as instructed  100 each  12  . HYDROcodone-acetaminophen (NORCO/VICODIN) 5-325 MG per tablet       . Lancets (FREESTYLE) lancets Use as instructed  100 each  12  . levothyroxine (SYNTHROID, LEVOTHROID) 112 MCG tablet Take 112 mcg by mouth daily before breakfast.      . losartan-hydrochlorothiazide (HYZAAR)  50-12.5 MG per tablet Take 1 tablet by mouth daily.  90 tablet  4  . meloxicam (MOBIC) 15 MG tablet Take 1 tablet (15 mg total) by mouth daily.  90 tablet  4  . metFORMIN (GLUCOPHAGE) 500 MG tablet Take 1 tablet (500 mg total) by mouth 2 (two) times daily with a meal.  180 tablet  4   No facility-administered encounter medications on file as of 03/08/2013.   BP 100/74  Pulse 99  Temp(Src) 98.5 F (36.9 C) (Oral)  Ht 5\' 7"  (1.702 m)  Wt 223 lb (101.152 kg)  BMI 34.92 kg/m2  SpO2  98%   Review of Systems  Constitutional: Negative for fever, chills, appetite change, fatigue and unexpected weight change.  HENT: Negative for ear pain, congestion, sore throat, trouble swallowing, neck pain, voice change and sinus pressure.   Eyes: Negative for visual disturbance.  Respiratory: Negative for cough, shortness of breath, wheezing and stridor.   Cardiovascular: Negative for chest pain, palpitations and leg swelling.  Gastrointestinal: Negative for nausea, vomiting, abdominal pain, diarrhea, constipation, blood in stool, abdominal distention and anal bleeding.  Genitourinary: Negative for dysuria and flank pain.  Musculoskeletal: Negative for myalgias, arthralgias and gait problem.  Skin: Negative for color change and rash.  Neurological: Negative for dizziness and headaches.  Hematological: Negative for adenopathy. Does not bruise/bleed easily.  Psychiatric/Behavioral: Negative for suicidal ideas, sleep disturbance and dysphoric mood. The patient is not nervous/anxious.        Objective:   Physical Exam  Constitutional: She is oriented to person, place, and time. She appears well-developed and well-nourished. No distress.  HENT:  Head: Normocephalic and atraumatic.  Right Ear: External ear normal.  Left Ear: External ear normal.  Nose: Nose normal.  Mouth/Throat: Oropharynx is clear and moist. No oropharyngeal exudate.  Eyes: Conjunctivae are normal. Pupils are equal, round, and reactive to light. Right eye exhibits no discharge. Left eye exhibits no discharge. No scleral icterus.  Neck: Normal range of motion. Neck supple. No tracheal deviation present. No thyromegaly present.  Cardiovascular: Normal rate, regular rhythm, normal heart sounds and intact distal pulses.  Exam reveals no gallop and no friction rub.   No murmur heard. Pulmonary/Chest: Effort normal and breath sounds normal. No accessory muscle usage. Not tachypneic. No respiratory distress. She has no  decreased breath sounds. She has no wheezes. She has no rales. She exhibits no tenderness. Right breast exhibits no inverted nipple, no mass, no nipple discharge, no skin change and no tenderness. Left breast exhibits no inverted nipple, no mass, no nipple discharge, no skin change and no tenderness. Breasts are symmetrical.  Abdominal: Soft. Bowel sounds are normal. She exhibits no distension and no mass. There is no tenderness. There is no rebound and no guarding.  Musculoskeletal: Normal range of motion. She exhibits no edema and no tenderness.  Lymphadenopathy:    She has no cervical adenopathy.  Neurological: She is alert and oriented to person, place, and time. No cranial nerve deficit. She exhibits normal muscle tone. Coordination normal.  Skin: Skin is warm and dry. No rash noted. She is not diaphoretic. No erythema. No pallor.  Psychiatric: She has a normal mood and affect. Her behavior is normal. Judgment and thought content normal.          Assessment & Plan:

## 2013-03-08 NOTE — Assessment & Plan Note (Signed)
Symptomatically doing well. Will continue Levothyroxine.

## 2013-03-08 NOTE — Assessment & Plan Note (Signed)
General medical exam normal today including breast exam. PAP and pelvic deferred because of pt age and preference. Last normal PAP 2013. Colonoscopy UTD. Mammogram UTD. Will check labs today including CMP, lipids, CBC. Encouraged healthy lifestyle including healthy diet and regular physical activity.

## 2013-03-11 ENCOUNTER — Encounter: Payer: Self-pay | Admitting: *Deleted

## 2013-06-13 ENCOUNTER — Ambulatory Visit (INDEPENDENT_AMBULATORY_CARE_PROVIDER_SITE_OTHER): Payer: Medicare Other | Admitting: Internal Medicine

## 2013-06-13 ENCOUNTER — Encounter: Payer: Self-pay | Admitting: Internal Medicine

## 2013-06-13 VITALS — BP 120/90 | HR 86 | Temp 98.0°F | Wt 233.0 lb

## 2013-06-13 DIAGNOSIS — E039 Hypothyroidism, unspecified: Secondary | ICD-10-CM

## 2013-06-13 DIAGNOSIS — M1990 Unspecified osteoarthritis, unspecified site: Secondary | ICD-10-CM

## 2013-06-13 DIAGNOSIS — I10 Essential (primary) hypertension: Secondary | ICD-10-CM

## 2013-06-13 DIAGNOSIS — Z23 Encounter for immunization: Secondary | ICD-10-CM

## 2013-06-13 DIAGNOSIS — E119 Type 2 diabetes mellitus without complications: Secondary | ICD-10-CM

## 2013-06-13 DIAGNOSIS — M129 Arthropathy, unspecified: Secondary | ICD-10-CM

## 2013-06-13 LAB — COMPREHENSIVE METABOLIC PANEL
ALT: 28 U/L (ref 0–35)
AST: 35 U/L (ref 0–37)
Albumin: 4.1 g/dL (ref 3.5–5.2)
Alkaline Phosphatase: 58 U/L (ref 39–117)
Calcium: 9.4 mg/dL (ref 8.4–10.5)
Chloride: 103 mEq/L (ref 96–112)
Potassium: 3.7 mEq/L (ref 3.5–5.1)
Sodium: 139 mEq/L (ref 135–145)
Total Protein: 7.1 g/dL (ref 6.0–8.3)

## 2013-06-13 LAB — MICROALBUMIN / CREATININE URINE RATIO: Creatinine,U: 312.9 mg/dL

## 2013-06-13 LAB — TSH: TSH: 5.11 u[IU]/mL (ref 0.35–5.50)

## 2013-06-13 LAB — HEMOGLOBIN A1C: Hgb A1c MFr Bld: 6.3 % (ref 4.6–6.5)

## 2013-06-13 MED ORDER — TRAMADOL HCL 50 MG PO TABS: 50.0000 mg | ORAL_TABLET | Freq: Three times a day (TID) | ORAL | Status: DC | PRN

## 2013-06-13 NOTE — Assessment & Plan Note (Signed)
Will check TSH with labs today. 

## 2013-06-13 NOTE — Assessment & Plan Note (Signed)
Patient reports blood sugars are well controlled. Will check A1c with labs today. Continue current medications.

## 2013-06-13 NOTE — Progress Notes (Signed)
Subjective:    Patient ID: Cynthia [name], female    DOB: Mar 07, 1946, 67 y.o.   MRN: [id number]  HPI 67 year old female with history of hypothyroidism, hypertension, diabetes, and osteoarthritis presents for followup. Her primary concern today is severe pain in both of her knees. She has been taking meloxicam with no improvement. Pain is exacerbated by cooler temperatures and rain. It is also exacerbated by increase physical activity. She has had steroid injections into her knees in the past with no improvement. She does not want to consider surgery.  In regards to diabetes, she reports blood sugars have been well-controlled. She is compliant with her medications. She did not bring record of blood sugars today.  She is also concerned because after recent thyroidectomy she stopped taking calcium supplementation. She questions whether or not she was supposed to continue this medication. She has not had any muscle cramping or other symptoms.  Outpatient Encounter Prescriptions as of 06/13/2013  Medication Sig Dispense Refill  . amLODipine (NORVASC) 10 MG tablet Take 1 tablet (10 mg total) by mouth daily.  90 tablet  4  . aspirin 81 MG tablet Take 81 mg by mouth daily.      Marland Kitchen atorvastatin (LIPITOR) 20 MG tablet Take 1 tablet (20 mg total) by mouth daily.  90 tablet  4  . glucose blood test strip Use as instructed  100 each  12  . Lancets (FREESTYLE) lancets Use as instructed  100 each  12  . levothyroxine (SYNTHROID, LEVOTHROID) 112 MCG tablet Take 112 mcg by mouth daily before breakfast.      . losartan-hydrochlorothiazide (HYZAAR) 50-12.5 MG per tablet Take 1 tablet by mouth daily.  90 tablet  4  . meloxicam (MOBIC) 15 MG tablet Take 1 tablet (15 mg total) by mouth daily.  90 tablet  4  . metFORMIN (GLUCOPHAGE) 500 MG tablet Take 1 tablet (500 mg total) by mouth 2 (two) times daily with a meal.  180 tablet  4   No facility-administered encounter medications on file as of 06/13/2013.   BP 120/90   Pulse 86  Temp(Src) 98 F (36.7 C) (Oral)  Wt 233 lb (105.688 kg)  BMI 36.48 kg/m2  SpO2 97%  Review of Systems  Constitutional: Positive for unexpected weight change. Negative for fever, chills, appetite change and fatigue.  HENT: Negative for ear pain, congestion, sore throat, trouble swallowing, neck pain, voice change and sinus pressure.   Eyes: Negative for visual disturbance.  Respiratory: Negative for cough, shortness of breath, wheezing and stridor.   Cardiovascular: Negative for chest pain, palpitations and leg swelling.  Gastrointestinal: Negative for nausea, vomiting, abdominal pain, diarrhea, constipation, blood in stool, abdominal distention and anal bleeding.  Genitourinary: Negative for dysuria and flank pain.  Musculoskeletal: Positive for joint swelling and arthralgias. Negative for myalgias and gait problem.  Skin: Negative for color change and rash.  Neurological: Negative for dizziness and headaches.  Hematological: Negative for adenopathy. Does not bruise/bleed easily.  Psychiatric/Behavioral: Negative for suicidal ideas, sleep disturbance and dysphoric mood. The patient is not nervous/anxious.        Objective:   Physical Exam  Constitutional: She is oriented to person, place, and time. She appears well-developed and well-nourished. No distress.  HENT:  Head: Normocephalic and atraumatic.  Right Ear: External ear normal.  Left Ear: External ear normal.  Nose: Nose normal.  Mouth/Throat: Oropharynx is clear and moist. No oropharyngeal exudate.  Eyes: Conjunctivae are normal. Pupils are equal, round, and reactive to light. Right  eye exhibits no discharge. Left eye exhibits no discharge. No scleral icterus.  Neck: Normal range of motion. Neck supple. No tracheal deviation present. No thyromegaly present.  Cardiovascular: Normal rate, regular rhythm, normal heart sounds and intact distal pulses.  Exam reveals no gallop and no friction rub.   No murmur  heard. Pulmonary/Chest: Effort normal and breath sounds normal. No accessory muscle usage. Not tachypneic. No respiratory distress. She has no decreased breath sounds. She has no wheezes. She has no rhonchi. She has no rales. She exhibits no tenderness.  Musculoskeletal: She exhibits no edema and no tenderness.       Right knee: She exhibits decreased range of motion and swelling.       Left knee: She exhibits decreased range of motion and swelling.  Crepitus bilateral knees  Lymphadenopathy:    She has no cervical adenopathy.  Neurological: She is alert and oriented to person, place, and time. No cranial nerve deficit. She exhibits normal muscle tone. Coordination normal.  Skin: Skin is warm and dry. No rash noted. She is not diaphoretic. No erythema. No pallor.  Psychiatric: She has a normal mood and affect. Her behavior is normal. Judgment and thought content normal.          Assessment & Plan:

## 2013-06-13 NOTE — Assessment & Plan Note (Signed)
BP Readings from Last 3 Encounters:  06/13/13 120/90  03/08/13 100/74  02/28/13 106/60   Blood pressure well-controlled on current medications. Will continue. Will check renal function with labs today.

## 2013-06-13 NOTE — Assessment & Plan Note (Signed)
Severe symptoms of osteoarthritis pain in both knees. Recommended referral back to orthopedics for possible intervention. Patient declines. Will continue meloxicam. Will try adding tramadol as needed for severe pain. Followup as needed.

## 2013-06-14 ENCOUNTER — Ambulatory Visit: Payer: Medicare Other

## 2013-06-14 DIAGNOSIS — E039 Hypothyroidism, unspecified: Secondary | ICD-10-CM

## 2013-06-14 LAB — T4, FREE: Free T4: 1.11 ng/dL (ref 0.60–1.60)

## 2013-06-17 ENCOUNTER — Encounter: Payer: Self-pay | Admitting: *Deleted

## 2013-06-18 ENCOUNTER — Encounter: Payer: Self-pay | Admitting: *Deleted

## 2013-08-07 ENCOUNTER — Ambulatory Visit: Payer: Self-pay | Admitting: Internal Medicine

## 2013-08-07 LAB — HM MAMMOGRAPHY: HM MAMMO: NORMAL

## 2013-08-07 IMAGING — MG MM DIGITAL SCREENING BILAT W/ CAD
1 series · 4 of 4 positions shown · non-contrast
Comparison: Previous exam(s).

CLINICAL DATA: Screening.

EXAM:
DIGITAL SCREENING BILATERAL MAMMOGRAM WITH CAD

[R CC · right · 4 of 4 slices shown]
[im 1/4]
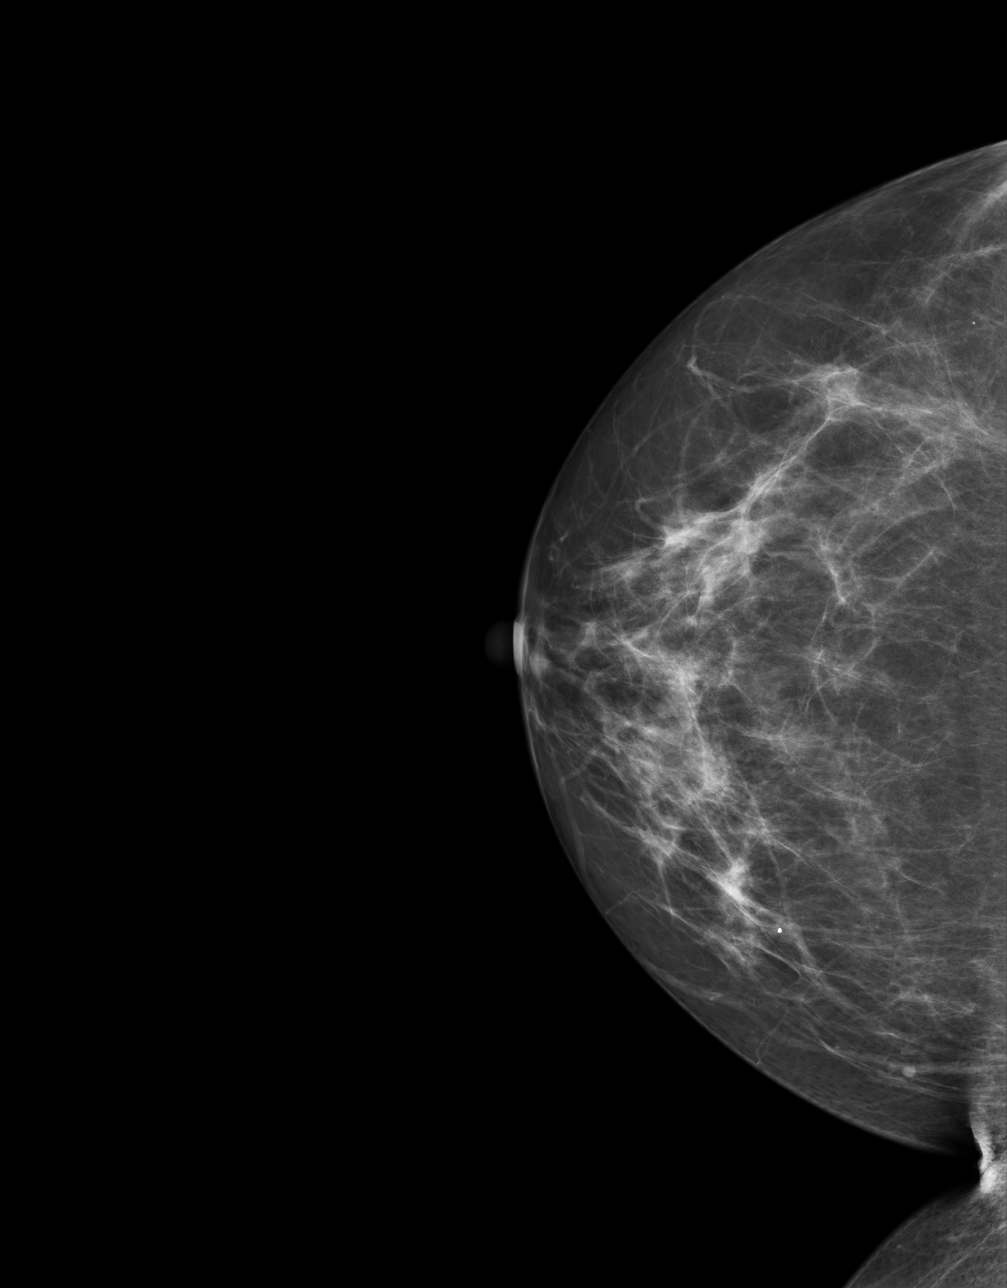
[im 2/4]
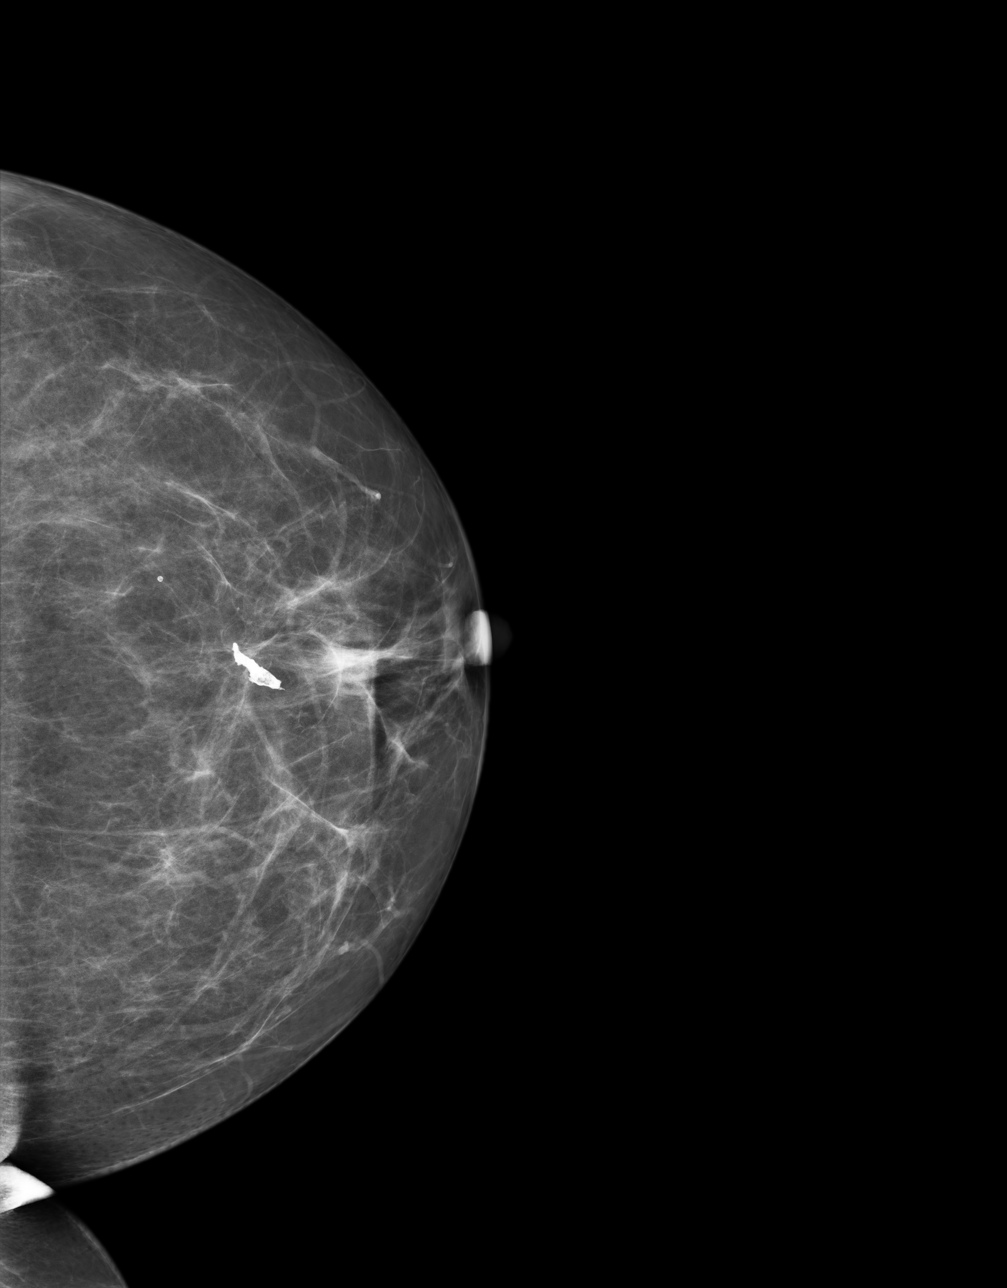
[im 3/4]
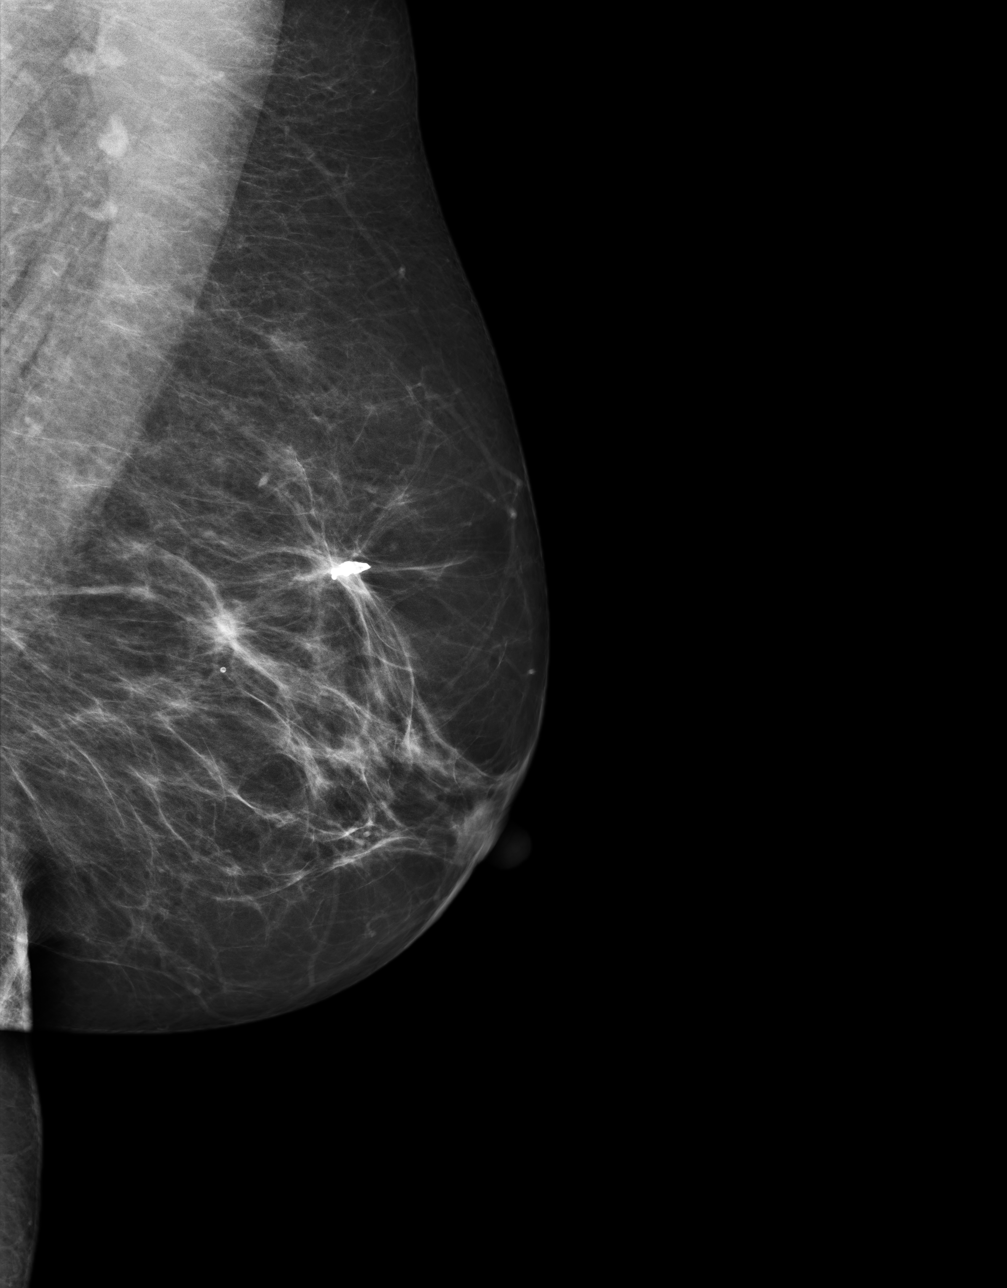
[im 4/4]
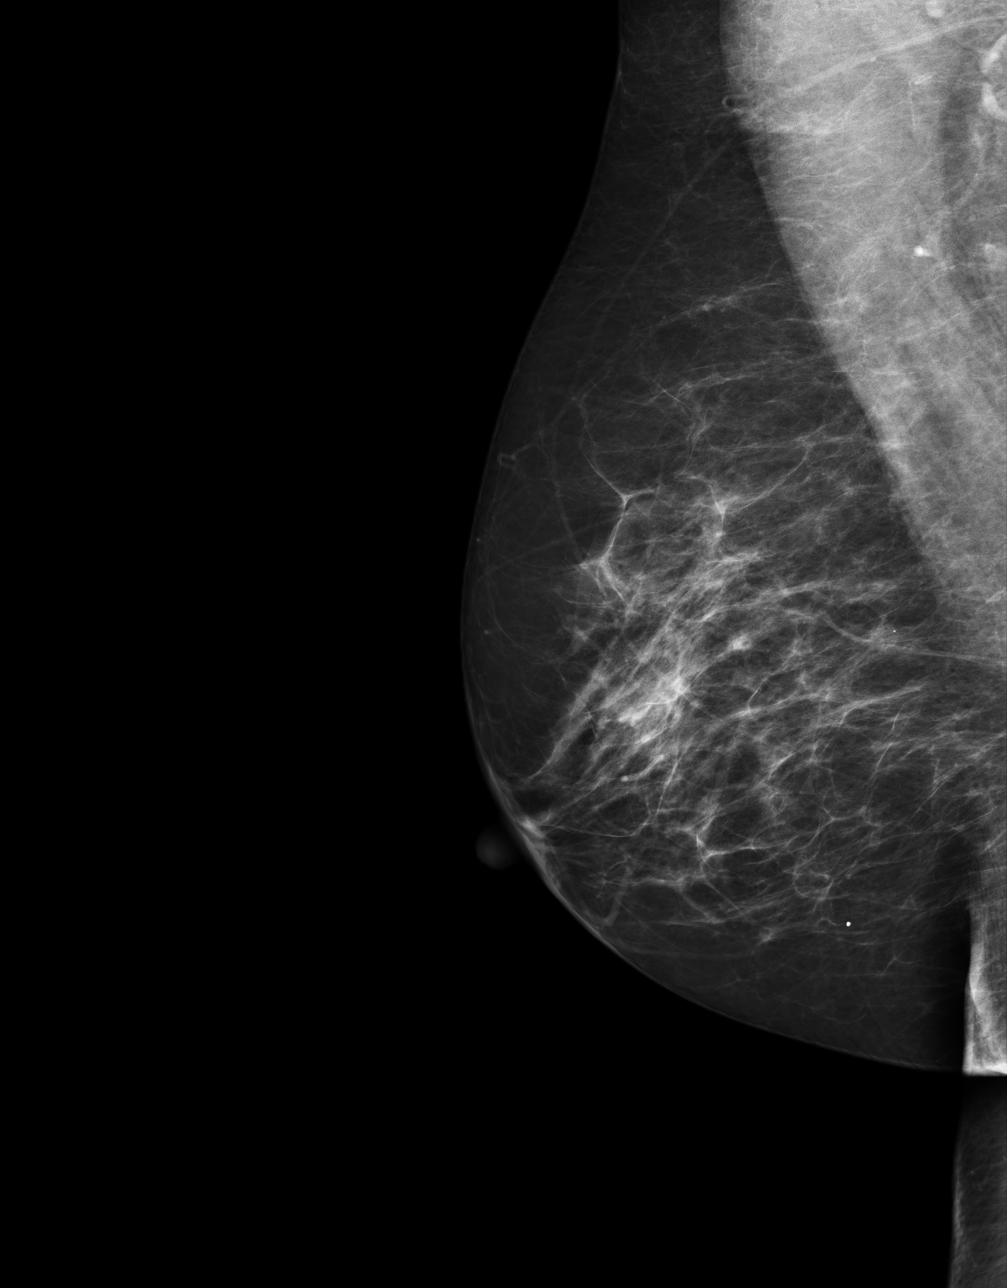

[4 of 4 positions shown; findings below may reference images not displayed]

ACR Breast Density Category b: There are scattered areas of
fibroglandular density.
FINDINGS: There are no findings suspicious for malignancy. Images were
processed with CAD.
IMPRESSION: No mammographic evidence of malignancy. A result letter of this
screening mammogram will be mailed directly to the patient.

RECOMMENDATION:
Screening mammogram in one year. (Code:[HN])

BI-RADS CATEGORY  1: Negative

## 2013-08-26 ENCOUNTER — Encounter: Payer: Self-pay | Admitting: Internal Medicine

## 2013-08-30 ENCOUNTER — Ambulatory Visit: Payer: Self-pay | Admitting: Cardiothoracic Surgery

## 2013-09-05 ENCOUNTER — Ambulatory Visit: Payer: Medicare Other | Admitting: Internal Medicine

## 2013-09-05 ENCOUNTER — Encounter: Payer: Self-pay | Admitting: Internal Medicine

## 2013-09-05 ENCOUNTER — Ambulatory Visit (INDEPENDENT_AMBULATORY_CARE_PROVIDER_SITE_OTHER): Payer: Medicare Other | Admitting: Internal Medicine

## 2013-09-05 VITALS — BP 118/80 | HR 85 | Temp 98.1°F | Wt 237.0 lb

## 2013-09-05 DIAGNOSIS — R9389 Abnormal findings on diagnostic imaging of other specified body structures: Secondary | ICD-10-CM

## 2013-09-05 DIAGNOSIS — E039 Hypothyroidism, unspecified: Secondary | ICD-10-CM

## 2013-09-05 DIAGNOSIS — I10 Essential (primary) hypertension: Secondary | ICD-10-CM

## 2013-09-05 DIAGNOSIS — E119 Type 2 diabetes mellitus without complications: Secondary | ICD-10-CM

## 2013-09-05 NOTE — Progress Notes (Signed)
Pre-visit discussion using our clinic review tool. No additional management support is needed unless otherwise documented below in the visit note.  

## 2013-09-06 LAB — COMPREHENSIVE METABOLIC PANEL
AST: 31 U/L (ref 0–37)
Albumin: 4.4 g/dL (ref 3.5–5.2)
CO2: 28 mEq/L (ref 19–32)
Chloride: 105 mEq/L (ref 96–112)
Creatinine, Ser: 0.7 mg/dL (ref 0.4–1.2)
GFR: 93.05 mL/min (ref >60.00)
Glucose, Bld: 86 mg/dL (ref 70–99)
Potassium: 4.2 mEq/L (ref 3.5–5.1)
Sodium: 140 mEq/L (ref 135–145)
Total Protein: 6.8 g/dL (ref 6.0–8.3)

## 2013-09-06 LAB — HEMOGLOBIN A1C: Hgb A1c MFr Bld: 6.2 % (ref 4.6–6.5)

## 2013-09-06 NOTE — Assessment & Plan Note (Signed)
BP Readings from Last 3 Encounters:  09/05/13 118/80  06/13/13 120/90  03/08/13 100/74   Blood pressure well-controlled on current medications. Will continue.

## 2013-09-06 NOTE — Progress Notes (Signed)
Subjective:    Patient ID: Cynthia [name], female    DOB: 1946/09/07, 67 y.o.   MRN: [id number]  HPI 67 year old female with history of diabetes, hypertension, hyperlipidemia presents for followup. She reports that she has generally been feeling well. She is compliant with medications. She denies any concerns today. She reports that she recently saw her thoracic oncologist for chest CT. She reports that lesion in her lung remained stable with no change. She questions how long she will need to continue repeat CT scans.  Outpatient Encounter Prescriptions as of 09/05/2013  Medication Sig  . amLODipine (NORVASC) 10 MG tablet Take 1 tablet (10 mg total) by mouth daily.  Marland Kitchen aspirin 81 MG tablet Take 81 mg by mouth daily.  Marland Kitchen atorvastatin (LIPITOR) 20 MG tablet Take 1 tablet (20 mg total) by mouth daily.  . Calcium Carbonate-Vitamin D 600-400 MG-UNIT per tablet Take 1 tablet by mouth daily.  Marland Kitchen glucose blood test strip Use as instructed  . Lancets (FREESTYLE) lancets Use as instructed  . levothyroxine (SYNTHROID, LEVOTHROID) 112 MCG tablet Take 112 mcg by mouth daily before breakfast.  . losartan-hydrochlorothiazide (HYZAAR) 50-12.5 MG per tablet Take 1 tablet by mouth daily.  . meloxicam (MOBIC) 15 MG tablet Take 1 tablet (15 mg total) by mouth daily.  . metFORMIN (GLUCOPHAGE) 500 MG tablet Take 1 tablet (500 mg total) by mouth 2 (two) times daily with a meal.  . traMADol (ULTRAM) 50 MG tablet Take 1 tablet (50 mg total) by mouth every 8 (eight) hours as needed for pain.   BP 118/80  Pulse 85  Temp(Src) 98.1 F (36.7 C) (Oral)  Wt 237 lb (107.502 kg)  SpO2 98%   Review of Systems  Constitutional: Negative for fever, chills, appetite change, fatigue and unexpected weight change.  HENT: Negative for congestion, ear pain, sinus pressure, sore throat, trouble swallowing and voice change.   Eyes: Negative for visual disturbance.  Respiratory: Negative for cough, shortness of breath, wheezing  and stridor.   Cardiovascular: Negative for chest pain, palpitations and leg swelling.  Gastrointestinal: Negative for nausea, vomiting, abdominal pain, diarrhea, constipation, blood in stool, abdominal distention and anal bleeding.  Genitourinary: Negative for dysuria and flank pain.  Musculoskeletal: Negative for arthralgias, gait problem, myalgias and neck pain.  Skin: Negative for color change and rash.  Neurological: Negative for dizziness and headaches.  Hematological: Negative for adenopathy. Does not bruise/bleed easily.  Psychiatric/Behavioral: Negative for suicidal ideas, sleep disturbance and dysphoric mood. The patient is not nervous/anxious.        Objective:   Physical Exam  Constitutional: She is oriented to person, place, and time. She appears well-developed and well-nourished. No distress.  HENT:  Head: Normocephalic and atraumatic.  Right Ear: External ear normal.  Left Ear: External ear normal.  Nose: Nose normal.  Mouth/Throat: Oropharynx is clear and moist. No oropharyngeal exudate.  Eyes: Conjunctivae are normal. Pupils are equal, round, and reactive to light. Right eye exhibits no discharge. Left eye exhibits no discharge. No scleral icterus.  Neck: Normal range of motion. Neck supple. No tracheal deviation present. No thyromegaly present.  Cardiovascular: Normal rate, regular rhythm, normal heart sounds and intact distal pulses.  Exam reveals no gallop and no friction rub.   No murmur heard. Pulmonary/Chest: Effort normal and breath sounds normal. No accessory muscle usage. Not tachypneic. No respiratory distress. She has no decreased breath sounds. She has no wheezes. She has no rhonchi. She has no rales. She exhibits no tenderness.  Musculoskeletal:  Normal range of motion. She exhibits no edema and no tenderness.  Lymphadenopathy:    She has no cervical adenopathy.  Neurological: She is alert and oriented to person, place, and time. No cranial nerve deficit. She  exhibits normal muscle tone. Coordination normal.  Skin: Skin is warm and dry. No rash noted. She is not diaphoretic. No erythema. No pallor.  Psychiatric: She has a normal mood and affect. Her behavior is normal. Judgment and thought content normal.          Assessment & Plan:

## 2013-09-06 NOTE — Assessment & Plan Note (Signed)
Blood sugar well-controlled with use of metformin. Will continue.

## 2013-09-06 NOTE — Assessment & Plan Note (Addendum)
Will check TSH and free T4 with labs. Continue levothyroxine.  Addendum: Note normal T4 with mild elevation TSH. Will plan repeat thyroid function with labs in 3 months.

## 2013-09-06 NOTE — Assessment & Plan Note (Signed)
Patient reports repeat CT scan has been performed. Will request records from her thoracic oncologist.

## 2013-09-10 ENCOUNTER — Encounter: Payer: Self-pay | Admitting: *Deleted

## 2013-09-16 ENCOUNTER — Other Ambulatory Visit: Payer: Self-pay | Admitting: Internal Medicine

## 2013-09-16 DIAGNOSIS — E119 Type 2 diabetes mellitus without complications: Secondary | ICD-10-CM

## 2013-09-17 ENCOUNTER — Telehealth: Payer: Self-pay | Admitting: *Deleted

## 2013-09-17 NOTE — Telephone Encounter (Signed)
This was already completed 09/06/13, confirmed receipt by pharmacy

## 2013-09-17 NOTE — Telephone Encounter (Signed)
Refill Request  Freestyle lancets

## 2013-09-26 ENCOUNTER — Ambulatory Visit: Payer: Self-pay | Admitting: Cardiothoracic Surgery

## 2013-09-26 IMAGING — CT CT CHEST W/O CM
2 of 3 series · 17 of 46 positions shown, 19 images · non-contrast
Comparison: [DATE] and [DATE] CT report

CLINICAL DATA: Followup indeterminate left lung nodule.

EXAM:
CT CHEST WITHOUT CONTRAST
TECHNIQUE: Multidetector CT imaging of the chest was performed following the
standard protocol without IV contrast.

[Series 2: routine chest wo · axial · 0.64mm/px · z∈[-510,-240]mm · 14 of 62 slices shown, 16 images]
[im 4/62  soft-tissue]
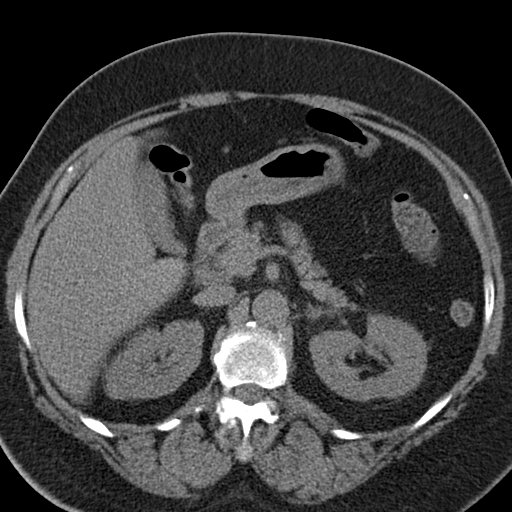
[im 4/62  bone]
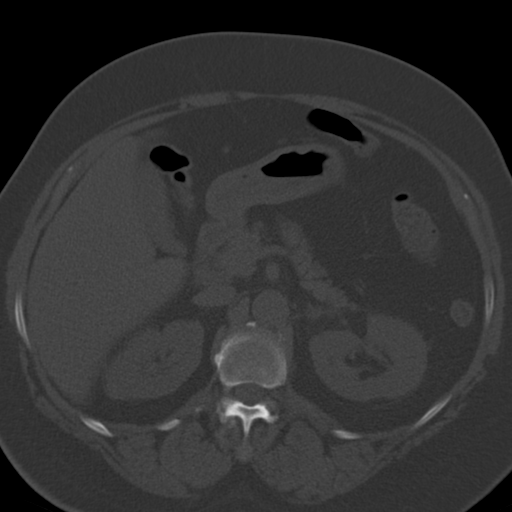
[im 8/62  soft-tissue]
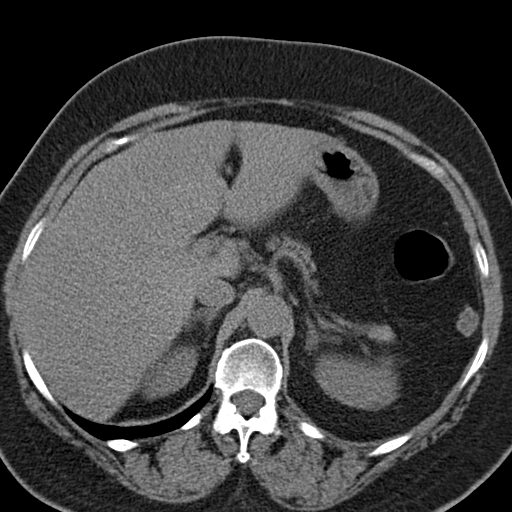
[im 12/62  soft-tissue]
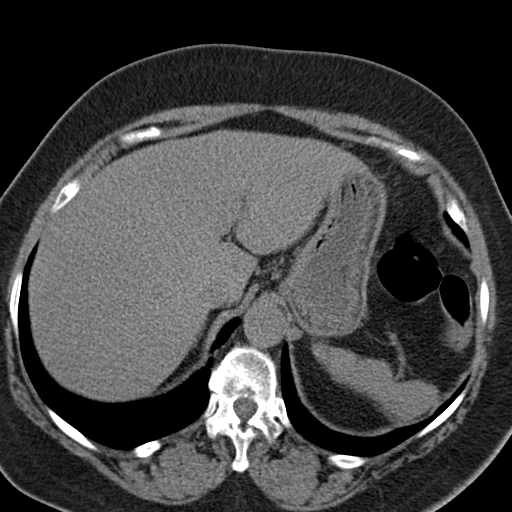
[im 16/62  soft-tissue]
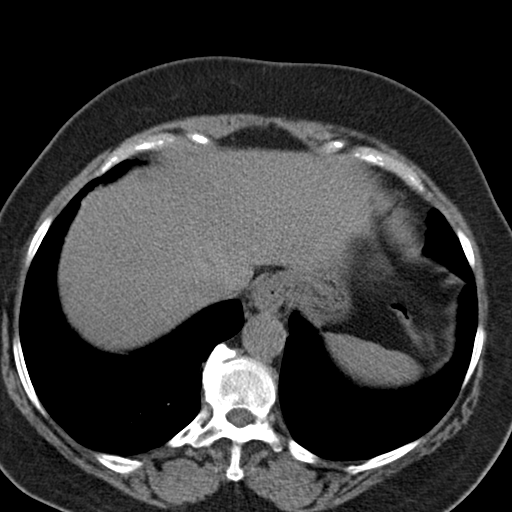
[im 20/62  soft-tissue]
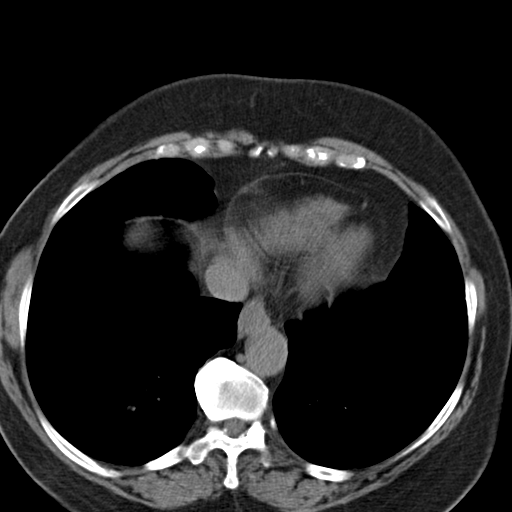
[im 24/62  soft-tissue]
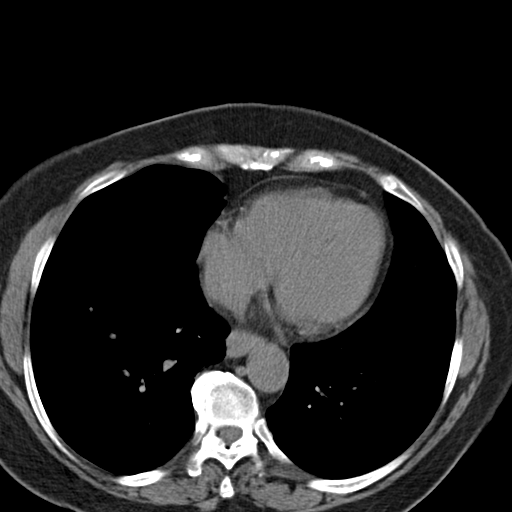
[im 28/62  soft-tissue]
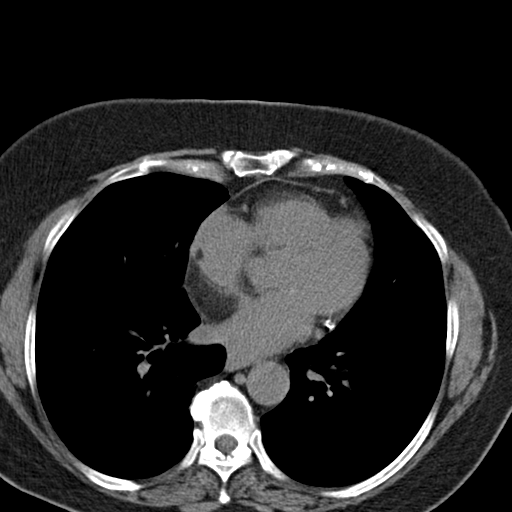
[im 34/62  soft-tissue]
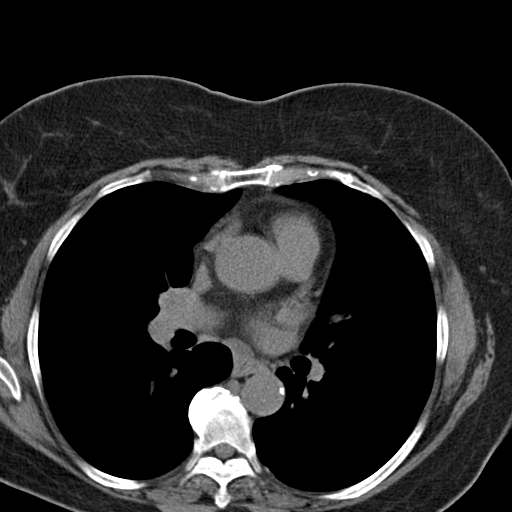
[im 38/62  soft-tissue]
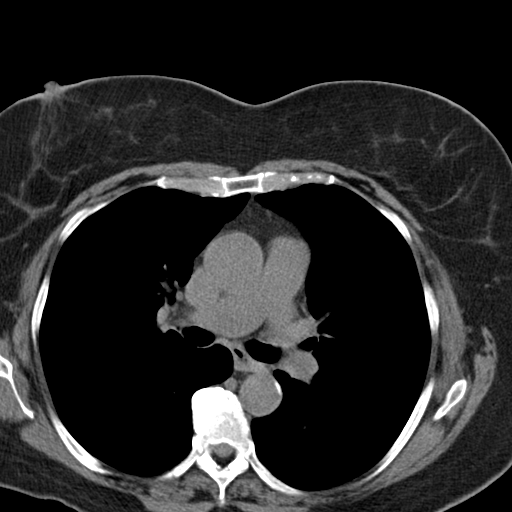
[im 38/62  bone]
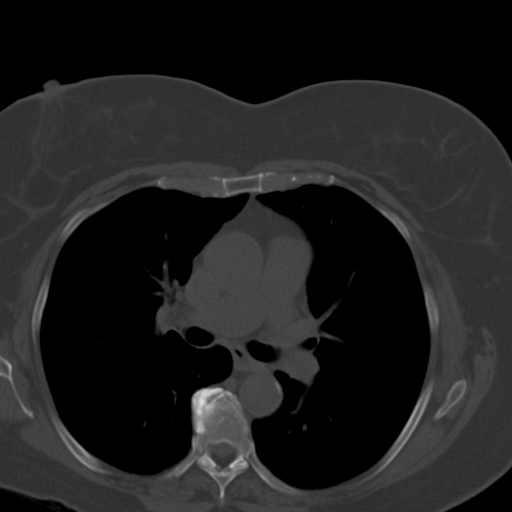
[im 42/62  soft-tissue]
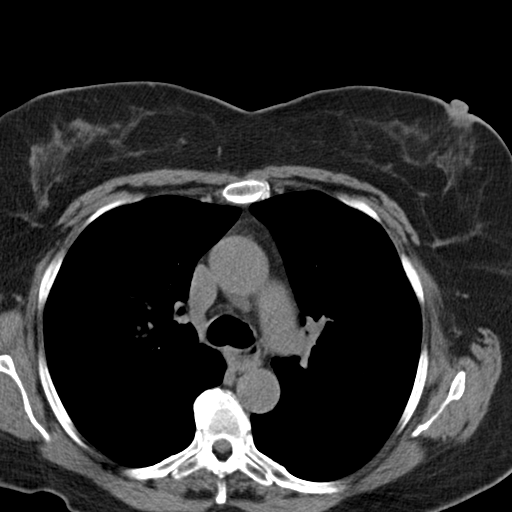
[im 46/62  soft-tissue]
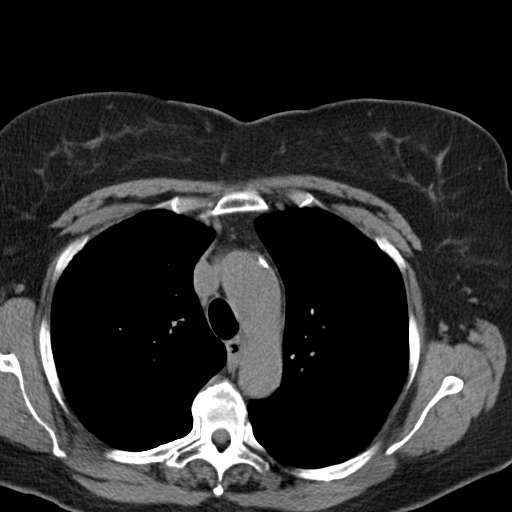
[im 50/62  soft-tissue]
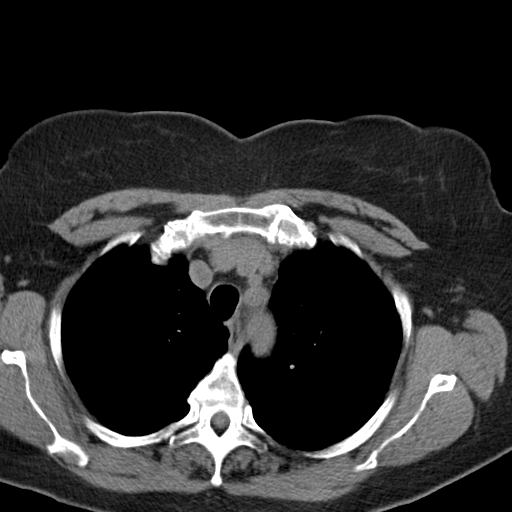
[im 54/62  soft-tissue]
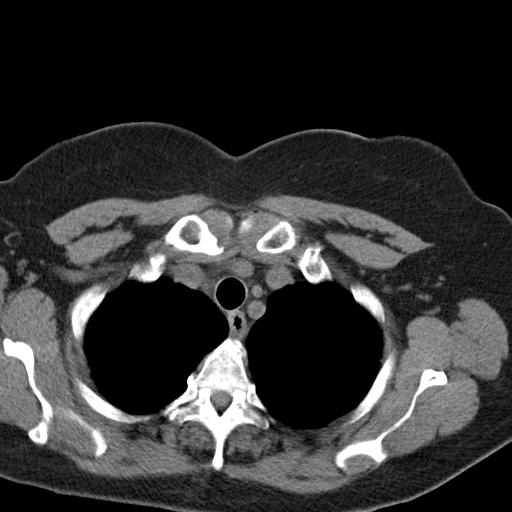
[im 58/62  soft-tissue]
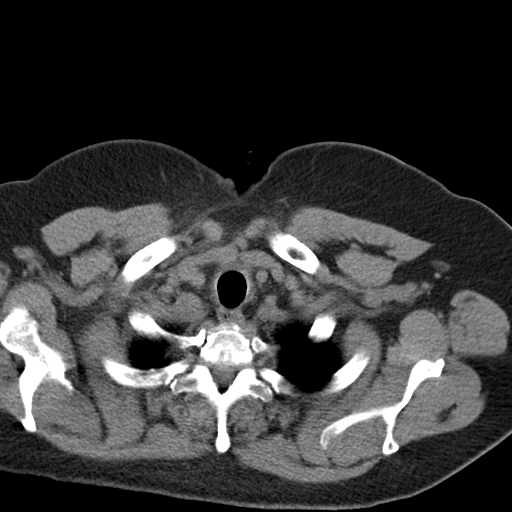

[Series 5: routine chest wo cor · coronal · 0.65mm/px · 3 of 129 slices shown]
[im 43/129  soft-tissue]
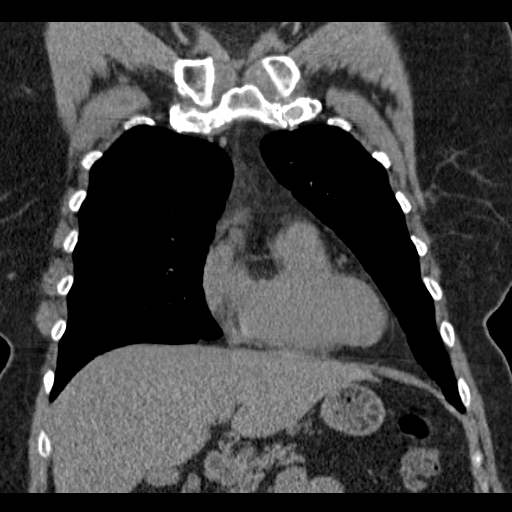
[im 57/129  soft-tissue]
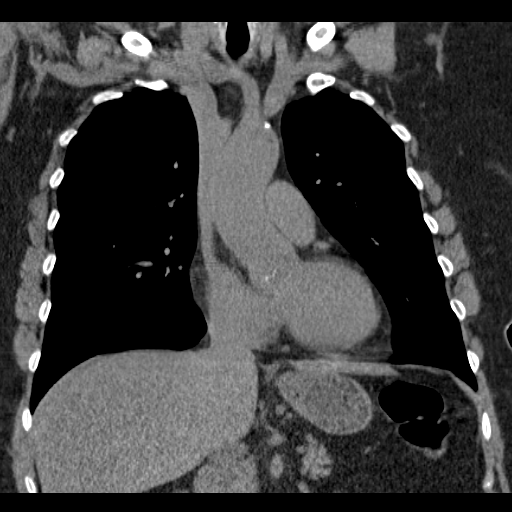
[im 72/129  soft-tissue]
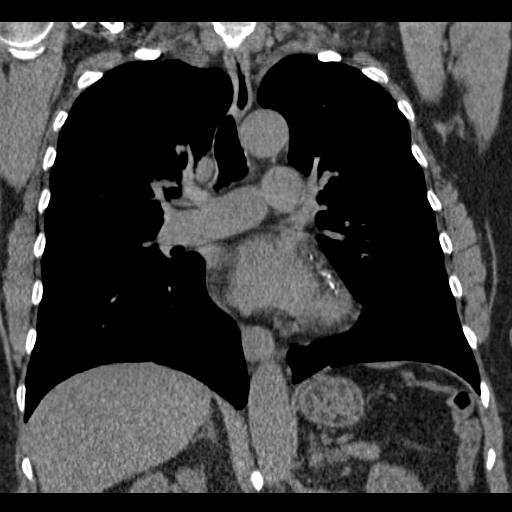

[17 of 46 positions shown; findings below may reference images not displayed]

FINDINGS: 9 mm medial left upper lobe nodule just above the level of a aortic
arch remains stable in size and morphology. A 3 mm nodule in the
left lung apex on image 6 is also stable. No new or enlarging
pulmonary nodules or masses are identified.

No evidence of pulmonary infiltrate or central endobronchial lesion.
No evidence of pleural or pericardial effusion. No evidence of hilar
or mediastinal masses. No adenopathy seen elsewhere within the
thorax.
IMPRESSION: Stable left upper lobe pulmonary nodules, largest measuring 9 mm.
Continued followup by CT is recommended in 12 months to confirm
stability over at least a 2 year period. This recommendation follows
the consensus statement: Guidelines for Management of Small
Pulmonary Nodules Detected on CT Scans: A Statement from the

## 2013-10-20 ENCOUNTER — Ambulatory Visit: Payer: Self-pay | Admitting: Cardiothoracic Surgery

## 2013-10-26 ENCOUNTER — Other Ambulatory Visit: Payer: Self-pay | Admitting: Internal Medicine

## 2013-11-04 ENCOUNTER — Ambulatory Visit: Payer: Self-pay | Admitting: Internal Medicine

## 2013-11-04 ENCOUNTER — Telehealth: Payer: Self-pay | Admitting: Internal Medicine

## 2013-11-04 NOTE — Telephone Encounter (Signed)
FYI

## 2013-11-04 NOTE — Telephone Encounter (Signed)
Patient Information:  Caller Name: Cherie  Phone: [phone]  Patient: Cynthia, [name]  Gender: Female  DOB: [birth year]  Age: 68 Years  PCP: Ronette Deter (Adults only)  Office Follow Up:  Does the office need to follow up with this patient?: No  Instructions For The Office: N/A  RN Note:  Hydrate and humidify.  Try Guaifenesin to loosen cough.   Symptoms  Reason For Call & Symptoms: Suspects bronchitis due to productive cough with yellow/green phlem.  FBS not tested 11/04/13  Reviewed Health History In EMR: Yes  Reviewed Medications In EMR: Yes  Reviewed Allergies In EMR: Yes  Reviewed Surgeries / Procedures: Yes  Date of Onset of Symptoms: 11/01/2013  Treatments Tried: [place]  Treatments Tried Worked: No  Guideline(s) Used:  Cough  Disposition Per Guideline:   Go to Office Now  Reason For Disposition Reached:   Wheezing is present  Advice Given:  Reassurance  Coughing is the way that our lungs remove irritants and mucus. It helps protect our lungs from getting pneumonia.  You can get a dry hacking cough after a chest cold. Sometimes this type of cough can last 1-3 weeks, and be worse at night.  Cough Medicines:  Home Remedy - Hard Candy: Hard candy works just as well as medicine-flavored OTC cough drops. Diabetics should use sugar-free candy.  OTC Cough Syrup - Dextromethorphan:  Cough syrups containing the cough suppressant dextromethorphan (DM) may help decrease your cough. Cough syrups work best for coughs that keep you awake at night. They can also sometimes help in the late stages of a respiratory infection when the cough is dry and hacking. They can be used along with cough drops.  Caution - Dextromethorphan:   Do not try to completely suppress coughs that produce mucus and phlegm. Remember that coughing is helpful in bringing up mucus from the lungs and preventing pneumonia.  Coughing Spasms:  Drink warm fluids. Inhale warm mist (Reason: both relax the  airway and loosen up the phlegm).  Prevent Dehydration:  Drink adequate liquids.  This will help soothe an irritated or dry throat and loosen up the phlegm.  Expected Course:   The expected course depends on what is causing the cough.  Viral bronchitis (chest cold) causes a cough that lasts 1 to 3 weeks. Sometimes you may cough up lots of phlegm (sputum, mucus). The mucus can normally be white, gray, yellow, or green.  Call Back If:  Difficulty breathing  Cough lasts more than 3 weeks  Fever lasts > 3 days  You become worse.  Patient Refused Recommendation:  Patient Refused Care Advice  Declined appointment at 1145 at [facility] office due to unfamiliar with location and distance; states she will try otc Guaifenesin and call for appointment if symptoms continue.

## 2013-11-04 NOTE — Telephone Encounter (Signed)
FYI to Dr. Kilcrease 

## 2013-11-06 ENCOUNTER — Ambulatory Visit (INDEPENDENT_AMBULATORY_CARE_PROVIDER_SITE_OTHER): Payer: Medicare Other | Admitting: Adult Health

## 2013-11-06 ENCOUNTER — Encounter: Payer: Self-pay | Admitting: Adult Health

## 2013-11-06 VITALS — BP 110/78 | HR 114 | Temp 98.8°F | Resp 14 | Wt 233.0 lb

## 2013-11-06 DIAGNOSIS — R05 Cough: Secondary | ICD-10-CM

## 2013-11-06 DIAGNOSIS — R059 Cough, unspecified: Secondary | ICD-10-CM

## 2013-11-06 MED ORDER — GUAIFENESIN-CODEINE 100-10 MG/5ML PO SOLN: 5.0000 mL | Freq: Three times a day (TID) | ORAL | Status: DC | PRN

## 2013-11-06 NOTE — Progress Notes (Signed)
Pre visit review using our clinic review tool, if applicable. No additional management support is needed unless otherwise documented below in the visit note. 

## 2013-11-06 NOTE — Progress Notes (Signed)
   Subjective:    Patient ID: Cynthia [name], female    DOB: [birth year], 68 y.o.   MRN: [id number]  HPI  Pt is a 68 y/o female who presents to clinic with coughing up yellow phlegm. She first took coriciden but did not feel this was improving. She then began taking mucinex. She reports feeling hot and perspiring then having chills. Did not take temperature. Symptoms began on Friday. Worse symptom is her cough which has been keeping her up at night.  Past Medical History  Diagnosis Date  . Arthritis   . Hypertension   . Diabetes mellitus without complication   . Thyroid disease   . Hyperlipidemia     Review of Systems  Constitutional: Positive for chills.  HENT: Positive for congestion. Negative for sore throat.   Respiratory: Positive for cough (chest congestion).   Cardiovascular: Negative.   Neurological: Negative.   Psychiatric/Behavioral: Negative.   All other systems reviewed and are negative.       Objective:   Physical Exam  Constitutional: She is oriented to person, place, and time. No distress.  HENT:  Head: Normocephalic and atraumatic.  Cardiovascular: Regular rhythm and normal heart sounds.  Exam reveals no gallop.   No murmur heard. tachycardia  Pulmonary/Chest: Effort normal and breath sounds normal. No respiratory distress. She has no wheezes. She has no rales.  Musculoskeletal: Normal range of motion.  Lymphadenopathy:    She has no cervical adenopathy.  Neurological: She is alert and oriented to person, place, and time.  Skin: Skin is warm and dry.  Psychiatric: She has a normal mood and affect. Her behavior is normal. Judgment and thought content normal.       Assessment & Plan:   1. Cough Increase fluids. Robitussin AC for severe cough. Tylenol for fever or general discomfort.

## 2013-11-06 NOTE — Patient Instructions (Signed)
  Start Robitussin AC for severe cough. You may take this 3 times a day for your cough.  Drink fluids to stay hydrated.  Chicken soup will help everything!  If you symptoms are not better after 4-5 days please call the office.

## 2013-12-05 ENCOUNTER — Encounter: Payer: Self-pay | Admitting: Internal Medicine

## 2013-12-05 ENCOUNTER — Ambulatory Visit (INDEPENDENT_AMBULATORY_CARE_PROVIDER_SITE_OTHER): Payer: Medicare Other | Admitting: Internal Medicine

## 2013-12-05 VITALS — BP 138/98 | HR 110 | Temp 98.3°F | Wt 236.0 lb

## 2013-12-05 DIAGNOSIS — I10 Essential (primary) hypertension: Secondary | ICD-10-CM

## 2013-12-05 DIAGNOSIS — E119 Type 2 diabetes mellitus without complications: Secondary | ICD-10-CM

## 2013-12-05 DIAGNOSIS — E785 Hyperlipidemia, unspecified: Secondary | ICD-10-CM

## 2013-12-05 DIAGNOSIS — E039 Hypothyroidism, unspecified: Secondary | ICD-10-CM

## 2013-12-05 LAB — LIPID PANEL
CHOLESTEROL: 155 mg/dL (ref 0–200)
HDL: 65.8 mg/dL (ref >39.00)
LDL Cholesterol: 71 mg/dL (ref 0–99)
Total CHOL/HDL Ratio: 2
Triglycerides: 91 mg/dL (ref 0.0–149.0)
VLDL: 18.2 mg/dL (ref 0.0–40.0)

## 2013-12-05 LAB — COMPREHENSIVE METABOLIC PANEL
ALT: 27 U/L (ref 0–35)
AST: 28 U/L (ref 0–37)
Albumin: 4.3 g/dL (ref 3.5–5.2)
Alkaline Phosphatase: 59 U/L (ref 39–117)
BILIRUBIN TOTAL: 0.7 mg/dL (ref 0.3–1.2)
BUN: 14 mg/dL (ref 6–23)
CO2: 25 mEq/L (ref 19–32)
Calcium: 9.5 mg/dL (ref 8.4–10.5)
Chloride: 102 mEq/L (ref 96–112)
Creatinine, Ser: 0.7 mg/dL (ref 0.4–1.2)
GFR: 84.22 mL/min (ref >60.00)
GLUCOSE: 106 mg/dL — AB (ref 70–99)
Potassium: 3.8 mEq/L (ref 3.5–5.1)
Sodium: 139 mEq/L (ref 135–145)
TOTAL PROTEIN: 7.6 g/dL (ref 6.0–8.3)

## 2013-12-05 LAB — MICROALBUMIN / CREATININE URINE RATIO
Creatinine,U: 114 mg/dL
MICROALB UR: 5.5 mg/dL — AB (ref 0.0–1.9)
Microalb Creat Ratio: 4.8 mg/g (ref 0.0–30.0)

## 2013-12-05 LAB — HEMOGLOBIN A1C: HEMOGLOBIN A1C: 6.3 % (ref 4.6–6.5)

## 2013-12-05 LAB — TSH: TSH: 4.43 u[IU]/mL (ref 0.35–5.50)

## 2013-12-05 NOTE — Assessment & Plan Note (Signed)
In a patient reports good control of blood sugars. Will check A1c with labs today. Continue metformin.

## 2013-12-05 NOTE — Assessment & Plan Note (Signed)
Will check TSH with labs today. 

## 2013-12-05 NOTE — Assessment & Plan Note (Signed)
Will check lipids with labs today. Continue atorvastatin.

## 2013-12-05 NOTE — Progress Notes (Signed)
Pre visit review using our clinic review tool, if applicable. No additional management support is needed unless otherwise documented below in the visit note. 

## 2013-12-05 NOTE — Assessment & Plan Note (Signed)
BP Readings from Last 3 Encounters:  12/05/13 138/98  11/06/13 110/78  09/05/13 118/80   Blood pressure slightly elevated today however is generally been well-controlled. Will continue losartan, hydrochlorothiazide, and amlodipine. Will monitor closely. If consistently greater than 140/90, would favor increasing losartan to 100 mg.

## 2013-12-05 NOTE — Progress Notes (Signed)
Subjective:    Patient ID: Cynthia [name], female    DOB: [birth year], 68 y.o.   MRN: [id number]  HPI 68YO female presents for follow up.  DM - reports BG well controlled. Compliant with medications.  Notes some dietary indiscretion recently, as she was caring for her 81 YO mother who was admitted after hip fracture. She is trying to work on [employer].  HTN - no recent chest pain, palpitations, headache. Complaint with meds.  Review of Systems  Constitutional: Negative for fever, chills, appetite change, fatigue and unexpected weight change.  HENT: Negative for congestion, ear pain, sinus pressure, sore throat, trouble swallowing and voice change.   Eyes: Negative for visual disturbance.  Respiratory: Negative for cough, shortness of breath, wheezing and stridor.   Cardiovascular: Negative for chest pain, palpitations and leg swelling.  Gastrointestinal: Negative for nausea, vomiting, abdominal pain, diarrhea, constipation, blood in stool, abdominal distention and anal bleeding.  Genitourinary: Negative for dysuria and flank pain.  Musculoskeletal: Negative for arthralgias, gait problem, myalgias and neck pain.  Skin: Negative for color change and rash.  Neurological: Negative for dizziness and headaches.  Hematological: Negative for adenopathy. Does not bruise/bleed easily.  Psychiatric/Behavioral: Negative for suicidal ideas, sleep disturbance and dysphoric mood. The patient is not nervous/anxious.        Objective:    BP 138/98  Pulse 110  Temp(Src) 98.3 F (36.8 C) (Oral)  Wt 236 lb (107.049 kg)  SpO2 97% Physical Exam  Constitutional: She is oriented to person, place, and time. She appears well-developed and well-nourished. No distress.  HENT:  Head: Normocephalic and atraumatic.  Right Ear: External ear normal.  Left Ear: External ear normal.  Nose: Nose normal.  Mouth/Throat: Oropharynx is clear and moist. No oropharyngeal exudate.  Eyes: Conjunctivae are  normal. Pupils are equal, round, and reactive to light. Right eye exhibits no discharge. Left eye exhibits no discharge. No scleral icterus.  Neck: Normal range of motion. Neck supple. No tracheal deviation present. No thyromegaly present.  Cardiovascular: Normal rate, regular rhythm, normal heart sounds and intact distal pulses.  Exam reveals no gallop and no friction rub.   No murmur heard. Pulmonary/Chest: Effort normal and breath sounds normal. No accessory muscle usage. Not tachypneic. No respiratory distress. She has no decreased breath sounds. She has no wheezes. She has no rhonchi. She has no rales. She exhibits no tenderness.  Musculoskeletal: Normal range of motion. She exhibits no edema and no tenderness.  Lymphadenopathy:    She has no cervical adenopathy.  Neurological: She is alert and oriented to person, place, and time. No cranial nerve deficit. She exhibits normal muscle tone. Coordination normal.  Skin: Skin is warm and dry. No rash noted. She is not diaphoretic. No erythema. No pallor.  Psychiatric: She has a normal mood and affect. Her behavior is normal. Judgment and thought content normal.          Assessment & Plan:   Problem List Items Addressed This Visit   Diabetes mellitus, type 2 - Primary     In a patient reports good control of blood sugars. Will check A1c with labs today. Continue metformin.    Relevant Orders      Comprehensive metabolic panel      Hemoglobin A1c      Lipid panel      Microalbumin / creatinine urine ratio      TSH   Hyperlipidemia     Will check lipids with labs today. Continue atorvastatin.  Hypertension      BP Readings from Last 3 Encounters:  12/05/13 138/98  11/06/13 110/78  09/05/13 118/80   Blood pressure slightly elevated today however is generally been well-controlled. Will continue losartan, hydrochlorothiazide, and amlodipine. Will monitor closely. If consistently greater than 140/90, would favor increasing losartan  to 100 mg.    Hypothyroidism       Return in about 3 months (around 03/07/2014) for Wellness Visit.

## 2013-12-06 ENCOUNTER — Encounter: Payer: Self-pay | Admitting: *Deleted

## 2013-12-06 ENCOUNTER — Telehealth: Payer: Self-pay | Admitting: Internal Medicine

## 2013-12-06 NOTE — Telephone Encounter (Signed)
Relevant patient education mailed to patient.  

## 2014-02-07 ENCOUNTER — Telehealth: Payer: Self-pay | Admitting: *Deleted

## 2014-02-07 NOTE — Telephone Encounter (Signed)
Pt requesting a Rx for Levothyrox[facility] to be [facility] [place], we haven't prescribed this for her before.  Pt states that she is not seeing the physician that was giving it to her anymore.  Last labs in March.

## 2014-02-09 ENCOUNTER — Other Ambulatory Visit: Payer: Self-pay | Admitting: Internal Medicine

## 2014-02-10 NOTE — Telephone Encounter (Signed)
Fine to refill Levothyroxine #30 with 6 refills. Please confirm with pharmacy that the dosing was 134mcg daily, from her other provider.

## 2014-02-11 ENCOUNTER — Other Ambulatory Visit: Payer: Self-pay | Admitting: Internal Medicine

## 2014-02-11 MED ORDER — LEVOTHYROXINE SODIUM 112 MCG PO TABS: 112.0000 ug | ORAL_TABLET | Freq: Every day | ORAL | Status: DC

## 2014-02-13 ENCOUNTER — Other Ambulatory Visit: Payer: Self-pay | Admitting: Internal Medicine

## 2014-02-14 ENCOUNTER — Other Ambulatory Visit: Payer: Self-pay

## 2014-02-14 ENCOUNTER — Ambulatory Visit (INDEPENDENT_AMBULATORY_CARE_PROVIDER_SITE_OTHER): Payer: Medicare Other | Admitting: Adult Health

## 2014-02-14 ENCOUNTER — Encounter: Payer: Self-pay | Admitting: Adult Health

## 2014-02-14 VITALS — BP 120/72 | HR 94 | Temp 98.1°F | Resp 14 | Ht 67.0 in

## 2014-02-14 DIAGNOSIS — E119 Type 2 diabetes mellitus without complications: Secondary | ICD-10-CM

## 2014-02-14 MED ORDER — GLUCOSE BLOOD VI STRP: ORAL_STRIP | Status: DC

## 2014-02-14 MED ORDER — LANCETS MISC: Status: DC

## 2014-02-14 NOTE — Patient Instructions (Addendum)
  I sent a prescription to your pharmacy ([facility]) for new glucometer - Ultra One Touch and the test strips that go along with it.  I also sent in a order for the lancets. If you do not need any at this time just tell the pharmacy to hold them on file for you.

## 2014-02-14 NOTE — Progress Notes (Signed)
Pre visit review using our clinic review tool, if applicable. No additional management support is needed unless otherwise documented below in the visit note. 

## 2014-02-14 NOTE — Progress Notes (Signed)
   Subjective:    Patient ID: Cynthia [name], female    DOB: Feb 01, 1946, 68 y.o.   MRN: [id number]  HPI Patient is a pleasant 68 year old female with history of diabetes who presents to clinic with concerns that her insurance company will not pay for her current glucometer or test strips. She has contacted her insurance company and they have advised her that she needs an ultra One Touch glucometer and strips. She has been checking her blood sugars approximately twice daily. No further concerns at this time.  Past Medical History  Diagnosis Date  . Arthritis   . Hypertension   . Diabetes mellitus without complication   . Thyroid disease   . Hyperlipidemia       Review of Systems  Constitutional: Negative.   HENT: Negative.   Eyes: Negative.   Respiratory: Negative.   Cardiovascular: Negative.   Gastrointestinal: Negative.   Endocrine: Negative.   Genitourinary: Negative.   Musculoskeletal: Negative.   Skin: Negative.   Allergic/Immunologic: Negative.   Neurological: Negative.   Hematological: Negative.   Psychiatric/Behavioral: Negative.        Objective:   Physical Exam  Constitutional: She is oriented to person, place, and time. No distress.  HENT:  Head: Normocephalic and atraumatic.  Eyes: Conjunctivae and EOM are normal.  Neck: Normal range of motion. Neck supple.  Pulmonary/Chest: Effort normal. No respiratory distress.  Musculoskeletal: Normal range of motion.  Neurological: She is alert and oriented to person, place, and time. She has normal reflexes.  Skin: Skin is warm and dry.  Psychiatric: She has a normal mood and affect. Her behavior is normal. Judgment and thought content normal.      Assessment & Plan:   1. [facility] [place] [facility] if [facility] the Ultra One Touch Glucometer. Will send patient there to pick up DME along with test strips. - For home use only DME Glucometer

## 2014-03-07 ENCOUNTER — Encounter: Payer: Self-pay | Admitting: Internal Medicine

## 2014-03-07 ENCOUNTER — Ambulatory Visit (INDEPENDENT_AMBULATORY_CARE_PROVIDER_SITE_OTHER): Payer: Medicare Other | Admitting: Internal Medicine

## 2014-03-07 ENCOUNTER — Other Ambulatory Visit (HOSPITAL_COMMUNITY)
Admission: RE | Admit: 2014-03-07 | Discharge: 2014-03-07 | Disposition: A | Discharge status: Home / Self Care | Payer: Medicare Other | Source: Ambulatory Visit | Attending: Internal Medicine | Admitting: Internal Medicine

## 2014-03-07 VITALS — BP 102/74 | HR 74 | Temp 98.1°F | Ht 68.5 in | Wt 239.8 lb

## 2014-03-07 DIAGNOSIS — Z0000 Encounter for general adult medical examination without abnormal findings: Secondary | ICD-10-CM

## 2014-03-07 DIAGNOSIS — Z23 Encounter for immunization: Secondary | ICD-10-CM

## 2014-03-07 DIAGNOSIS — E785 Hyperlipidemia, unspecified: Secondary | ICD-10-CM

## 2014-03-07 DIAGNOSIS — E119 Type 2 diabetes mellitus without complications: Secondary | ICD-10-CM

## 2014-03-07 DIAGNOSIS — I10 Essential (primary) hypertension: Secondary | ICD-10-CM

## 2014-03-07 DIAGNOSIS — Z1151 Encounter for screening for human papillomavirus (HPV): Secondary | ICD-10-CM | POA: Insufficient documentation

## 2014-03-07 DIAGNOSIS — Z124 Encounter for screening for malignant neoplasm of cervix: Secondary | ICD-10-CM | POA: Insufficient documentation

## 2014-03-07 DIAGNOSIS — R9389 Abnormal findings on diagnostic imaging of other specified body structures: Secondary | ICD-10-CM

## 2014-03-07 DIAGNOSIS — E039 Hypothyroidism, unspecified: Secondary | ICD-10-CM

## 2014-03-07 LAB — COMPREHENSIVE METABOLIC PANEL
ALBUMIN: 4.3 g/dL (ref 3.5–5.2)
ALT: 24 U/L (ref 0–35)
AST: 30 U/L (ref 0–37)
Alkaline Phosphatase: 63 U/L (ref 39–117)
BUN: 13 mg/dL (ref 6–23)
CALCIUM: 10 mg/dL (ref 8.4–10.5)
CHLORIDE: 101 meq/L (ref 96–112)
CO2: 27 meq/L (ref 19–32)
CREATININE: 0.6 mg/dL (ref 0.4–1.2)
GFR: 101.62 mL/min (ref >60.00)
Glucose, Bld: 75 mg/dL (ref 70–99)
Potassium: 3.9 mEq/L (ref 3.5–5.1)
Sodium: 138 mEq/L (ref 135–145)
Total Bilirubin: 0.7 mg/dL (ref 0.2–1.2)
Total Protein: 7.1 g/dL (ref 6.0–8.3)

## 2014-03-07 LAB — LIPID PANEL
CHOL/HDL RATIO: 2
Cholesterol: 162 mg/dL (ref 0–200)
HDL: 66.3 mg/dL (ref >39.00)
LDL CALC: 78 mg/dL (ref 0–99)
NonHDL: 95.7
TRIGLYCERIDES: 90 mg/dL (ref 0.0–149.0)
VLDL: 18 mg/dL (ref 0.0–40.0)

## 2014-03-07 LAB — MICROALBUMIN / CREATININE URINE RATIO
CREATININE, U: 63.5 mg/dL
Microalb Creat Ratio: 1.3 mg/g (ref 0.0–30.0)
Microalb, Ur: 0.8 mg/dL (ref 0.0–1.9)

## 2014-03-07 LAB — HEMOGLOBIN A1C: Hgb A1c MFr Bld: 6.3 % (ref 4.6–6.5)

## 2014-03-07 LAB — TSH: TSH: 5.31 u[IU]/mL — ABNORMAL HIGH (ref 0.35–4.50)

## 2014-03-07 LAB — HM DIABETES FOOT EXAM: HM Diabetic Foot Exam: NORMAL

## 2014-03-07 MED ORDER — LEVOTHYROXINE SODIUM 112 MCG PO TABS
112.0000 ug | ORAL_TABLET | Freq: Every day | ORAL | Status: DC
Start: 2014-03-07 — End: 2014-06-27

## 2014-03-07 MED ORDER — LOSARTAN POTASSIUM-HCTZ 50-12.5 MG PO TABS: 1.0000 | ORAL_TABLET | Freq: Every day | ORAL | Status: DC

## 2014-03-07 MED ORDER — ATORVASTATIN CALCIUM 20 MG PO TABS: 20.0000 mg | ORAL_TABLET | Freq: Every day | ORAL | Status: DC

## 2014-03-07 MED ORDER — AMLODIPINE BESYLATE 10 MG PO TABS: 10.0000 mg | ORAL_TABLET | Freq: Every day | ORAL | Status: DC

## 2014-03-07 MED ORDER — METFORMIN HCL 500 MG PO TABS: 500.0000 mg | ORAL_TABLET | Freq: Two times a day (BID) | ORAL | Status: DC

## 2014-03-07 NOTE — Assessment & Plan Note (Addendum)
General medical exam normal today including breast exam and pelvic exam. PAP pending. Colonoscopy UTD. Mammogram UTD. Will check labs today including CMP, lipids, CBC. Encouraged healthy lifestyle including healthy diet and regular physical activity. Prevnar given today.

## 2014-03-07 NOTE — Progress Notes (Signed)
Pre visit review using our clinic review tool, if applicable. No additional management support is needed unless otherwise documented below in the visit note. 

## 2014-03-07 NOTE — Addendum Note (Signed)
Addended by: Karlene Einstein D on: 03/07/2014 02:21 PM   Modules accepted: Orders

## 2014-03-07 NOTE — Assessment & Plan Note (Signed)
Will check A1c with labs. Continue metformin.

## 2014-03-07 NOTE — Assessment & Plan Note (Signed)
Will check lipids with labs. Continue Atorvastatin. 

## 2014-03-07 NOTE — Assessment & Plan Note (Signed)
BP Readings from Last 3 Encounters:  03/07/14 102/74  02/14/14 120/72  12/05/13 138/98   BP well controlled on Losartan-HCTZ and amlodipine.. Will continue. Will check renal function with labs.

## 2014-03-07 NOTE — Progress Notes (Signed)
Subjective:    Patient ID: Cynthia [name], female    DOB: [birth year], 68 y.o.   MRN: [id number]  HPI The patient is here for annual Medicare wellness examination and management of other chronic and acute problems.   The risk factors are reflected in the social history.  The roster of all physicians providing medical care to patient - is listed in the Snapshot section of the chart.  Activities of daily living:  The patient is 100% independent in all ADLs: dressing, toileting, feeding as well as independent mobility. Lives in home with husband. Home is one story. Has hard wood floors mostly, with carpet in two bedrooms.  Home safety : The patient has smoke detectors in the home. They wear seatbelts.  There are no firearms at home. There is no violence in the home.   There is no risks for hepatitis, STDs or HIV. There is no history of blood transfusion. They have no travel history to infectious disease endemic areas of the world.  The patient has not seen their dentist in the last six month. Has dentures. They have seen their eye doctor in the last year. [facility] No issues with hearing.  They have deferred audiologic testing in the last year.   They do not  have excessive sun exposure. Discussed the need for sun protection: hats, long sleeves and use of sunscreen if there is significant sun exposure.   Diet: the importance of a healthy diet is discussed. They do have a healthy diet.  The benefits of regular aerobic exercise were discussed. She walks occasionally.  Depression screen: there are no signs or vegative symptoms of depression- irritability, change in appetite, anhedonia, sadness/tearfullness.  Cognitive assessment: the patient manages all their financial and personal affairs and is actively engaged. They could relate day,date,year and events.  The following portions of the patient's history were reviewed and updated as appropriate: allergies, current medications,  past family history, past medical history,  past surgical history, past social history  and problem list.  HCPOA - none in place, but will look into this.  Visual acuity was not assessed per patient preference since she has regular follow up with her ophthalmologist. Hearing and body mass index were assessed and reviewed.   During the course of the visit the patient was educated and counseled about appropriate screening and preventive services including : fall prevention , diabetes screening, nutrition counseling, colorectal cancer screening, and recommended immunizations.    No other concerns today. Reports BG well controlled, and review of meter shows BG 80-90s mostly.  Review of Systems  Constitutional: Negative for fever, chills, appetite change, fatigue and unexpected weight change.  HENT: Negative for congestion, hearing loss, trouble swallowing and voice change.   Eyes: Negative for visual disturbance.  Respiratory: Negative for shortness of breath.   Cardiovascular: Negative for chest pain and leg swelling.  Gastrointestinal: Negative for nausea, vomiting, abdominal pain, diarrhea, constipation, blood in stool and abdominal distention.  Musculoskeletal: Negative for arthralgias, back pain, joint swelling and myalgias.  Skin: Negative for color change and rash.  Neurological: Negative for weakness, light-headedness and headaches.  Hematological: Negative for adenopathy. Does not bruise/bleed easily.  Psychiatric/Behavioral: Negative for dysphoric mood. The patient is not nervous/anxious.        Objective:    BP 102/74  Pulse 74  Temp(Src) 98.1 F (36.7 C) (Oral)  Ht 5' 8.5" (1.74 m)  Wt 239 lb 12 oz (108.75 kg)  BMI 35.92 kg/m2  SpO2 98%  Physical Exam  Constitutional: She is oriented to person, place, and time. She appears well-developed and well-nourished. No distress.  HENT:  Head: Normocephalic and atraumatic.  Right Ear: External ear normal.  Left Ear: External ear  normal.  Nose: Nose normal.  Mouth/Throat: Oropharynx is clear and moist. No oropharyngeal exudate.  Eyes: Conjunctivae are normal. Pupils are equal, round, and reactive to light. Right eye exhibits no discharge. Left eye exhibits no discharge. No scleral icterus.  Neck: Normal range of motion. Neck supple. No tracheal deviation present. No thyromegaly present.  Cardiovascular: Normal rate, regular rhythm, normal heart sounds and intact distal pulses.  Exam reveals no gallop and no friction rub.   No murmur heard. Pulmonary/Chest: Effort normal and breath sounds normal. No accessory muscle usage. Not tachypneic. No respiratory distress. She has no decreased breath sounds. She has no wheezes. She has no rhonchi. She has no rales. She exhibits no tenderness. Right breast exhibits no inverted nipple, no mass, no nipple discharge, no skin change and no tenderness. Left breast exhibits no inverted nipple, no mass, no nipple discharge, no skin change and no tenderness. Breasts are symmetrical.  Abdominal: Soft. Bowel sounds are normal. She exhibits no distension and no mass. There is no tenderness. There is no rebound and no guarding.  Musculoskeletal: Normal range of motion. She exhibits no edema and no tenderness.  Lymphadenopathy:    She has no cervical adenopathy.  Neurological: She is alert and oriented to person, place, and time. No cranial nerve deficit. She exhibits normal muscle tone. Coordination normal.  Skin: Skin is warm and dry. No rash noted. She is not diaphoretic. No erythema. No pallor.  Psychiatric: She has a normal mood and affect. Her behavior is normal. Judgment and thought content normal.          Assessment & Plan:   Problem List Items Addressed This Visit     Unprioritized   Abnormal CT scan, chest   Diabetes mellitus, type 2     Will check A1c with labs. Continue metformin.    Relevant Medications      metFORMIN (GLUCOPHAGE) tablet      losartan-hydrochlorothiazide  (HYZAAR) 50-12.5 MG per tablet      atorvastatin (LIPITOR) tablet   Other Relevant Orders      Comprehensive metabolic panel      Hemoglobin A1c   Hyperlipidemia     Will check lipids with labs. Continue Atorvastatin.    Relevant Medications      losartan-hydrochlorothiazide (HYZAAR) 50-12.5 MG per tablet      atorvastatin (LIPITOR) tablet      amLODIpine (NORVASC) tablet   Other Relevant Orders      Lipid panel   Hypertension      BP Readings from Last 3 Encounters:  03/07/14 102/74  02/14/14 120/72  12/05/13 138/98   BP well controlled on Losartan-HCTZ and amlodipine.. Will continue. Will check renal function with labs.    Relevant Medications      losartan-hydrochlorothiazide (HYZAAR) 50-12.5 MG per tablet      atorvastatin (LIPITOR) tablet      amLODIpine (NORVASC) tablet   Other Relevant Orders      Microalbumin / creatinine urine ratio   Hypothyroidism     Will check TSH with labs today. Continue levothyroxine.    Relevant Medications      levothyroxine (SYNTHROID, LEVOTHROID) tablet   Other Relevant Orders      TSH   Medicare annual wellness visit, subsequent - Primary  General medical exam normal today including breast exam and pelvic exam. PAP pending. Colonoscopy UTD. Mammogram UTD. Will check labs today including CMP, lipids, CBC. Encouraged healthy lifestyle including healthy diet and regular physical activity. Prevnar given today.       Other Visit Diagnoses   Need for prophylactic vaccination against Streptococcus pneumoniae (pneumococcus)        Relevant Orders       Pneumococcal conjugate vaccine 13-valent (Completed)        Return in about 3 months (around 06/07/2014) for Recheck of Diabetes.

## 2014-03-07 NOTE — Assessment & Plan Note (Signed)
Will check TSH with labs today. Continue levothyroxine.

## 2014-03-10 ENCOUNTER — Other Ambulatory Visit: Payer: Self-pay | Admitting: *Deleted

## 2014-03-10 LAB — CYTOLOGY - PAP

## 2014-03-10 MED ORDER — LEVOTHYROXINE SODIUM 125 MCG PO TABS: 125.0000 ug | ORAL_TABLET | Freq: Every day | ORAL | Status: DC

## 2014-03-11 ENCOUNTER — Encounter: Payer: Self-pay | Admitting: *Deleted

## 2014-04-07 ENCOUNTER — Other Ambulatory Visit: Payer: Self-pay | Admitting: Internal Medicine

## 2014-04-12 LAB — HM DIABETES EYE EXAM

## 2014-04-24 ENCOUNTER — Other Ambulatory Visit: Payer: Self-pay | Admitting: Internal Medicine

## 2014-05-06 ENCOUNTER — Other Ambulatory Visit: Payer: Self-pay | Admitting: Internal Medicine

## 2014-05-16 ENCOUNTER — Other Ambulatory Visit: Payer: Self-pay | Admitting: Internal Medicine

## 2014-05-16 ENCOUNTER — Other Ambulatory Visit: Payer: Self-pay | Admitting: *Deleted

## 2014-05-16 MED ORDER — LEVOTHYROXINE SODIUM 125 MCG PO TABS: ORAL_TABLET | ORAL | Status: DC

## 2014-06-13 ENCOUNTER — Ambulatory Visit: Payer: Medicare Other | Admitting: Internal Medicine

## 2014-06-13 ENCOUNTER — Ambulatory Visit (INDEPENDENT_AMBULATORY_CARE_PROVIDER_SITE_OTHER): Payer: Medicare Other | Admitting: Internal Medicine

## 2014-06-13 ENCOUNTER — Encounter: Payer: Self-pay | Admitting: Internal Medicine

## 2014-06-13 VITALS — BP 122/80 | HR 94 | Temp 98.3°F | Ht 68.5 in | Wt 240.5 lb

## 2014-06-13 DIAGNOSIS — R0609 Other forms of dyspnea: Secondary | ICD-10-CM | POA: Insufficient documentation

## 2014-06-13 DIAGNOSIS — R0989 Other specified symptoms and signs involving the circulatory and respiratory systems: Secondary | ICD-10-CM

## 2014-06-13 DIAGNOSIS — E039 Hypothyroidism, unspecified: Secondary | ICD-10-CM

## 2014-06-13 DIAGNOSIS — E119 Type 2 diabetes mellitus without complications: Secondary | ICD-10-CM

## 2014-06-13 DIAGNOSIS — I10 Essential (primary) hypertension: Secondary | ICD-10-CM

## 2014-06-13 DIAGNOSIS — Z23 Encounter for immunization: Secondary | ICD-10-CM

## 2014-06-13 DIAGNOSIS — E785 Hyperlipidemia, unspecified: Secondary | ICD-10-CM

## 2014-06-13 LAB — COMPREHENSIVE METABOLIC PANEL
ALK PHOS: 67 U/L (ref 39–117)
ALT: 24 U/L (ref 0–35)
AST: 29 U/L (ref 0–37)
Albumin: 4.1 g/dL (ref 3.5–5.2)
BUN: 15 mg/dL (ref 6–23)
CO2: 26 mEq/L (ref 19–32)
Calcium: 9.8 mg/dL (ref 8.4–10.5)
Chloride: 100 mEq/L (ref 96–112)
Creatinine, Ser: 0.7 mg/dL (ref 0.4–1.2)
GFR: 82.78 mL/min (ref >60.00)
Glucose, Bld: 92 mg/dL (ref 70–99)
Potassium: 4.2 mEq/L (ref 3.5–5.1)
SODIUM: 134 meq/L — AB (ref 135–145)
TOTAL PROTEIN: 7.1 g/dL (ref 6.0–8.3)
Total Bilirubin: 0.6 mg/dL (ref 0.2–1.2)

## 2014-06-13 LAB — HEMOGLOBIN A1C: Hgb A1c MFr Bld: 6.4 % (ref 4.6–6.5)

## 2014-06-13 LAB — MICROALBUMIN / CREATININE URINE RATIO
Creatinine,U: 55.7 mg/dL
Microalb Creat Ratio: 0.2 mg/g (ref 0.0–30.0)
Microalb, Ur: 0.1 mg/dL (ref 0.0–1.9)

## 2014-06-13 LAB — LIPID PANEL
CHOL/HDL RATIO: 2
Cholesterol: 147 mg/dL (ref 0–200)
HDL: 61.2 mg/dL (ref >39.00)
LDL Cholesterol: 74 mg/dL (ref 0–99)
NONHDL: 85.8
Triglycerides: 59 mg/dL (ref 0.0–149.0)
VLDL: 11.8 mg/dL (ref 0.0–40.0)

## 2014-06-13 LAB — TSH: TSH: 2.27 u[IU]/mL (ref 0.35–4.50)

## 2014-06-13 NOTE — Assessment & Plan Note (Signed)
Lab Results  Component Value Date   HGBA1C 6.3 03/07/2014   Will check A1c with labs. Continue Metformin. Eye exam and foot exam UTD.

## 2014-06-13 NOTE — Progress Notes (Signed)
Pre visit review using our clinic review tool, if applicable. No additional management support is needed unless otherwise documented below in the visit note. 

## 2014-06-13 NOTE — Patient Instructions (Signed)
Labs today.  We will set up an appointment with cardiology to evaluation shortness of breath.  Follow up in 3 months.

## 2014-06-13 NOTE — Assessment & Plan Note (Signed)
Recent worsening dyspnea on exertion. Exam normal. Patient has several risk factors for CAD including DM, HL, history of tobacco use. Will set up cardiology evaluation for possible stress test/ECHO.

## 2014-06-13 NOTE — Assessment & Plan Note (Signed)
BP Readings from Last 3 Encounters:  06/13/14 122/80  03/07/14 102/74  02/14/14 120/72   BP well controlled on current medications. Will check renal function with labs.

## 2014-06-13 NOTE — Assessment & Plan Note (Signed)
Will check lipids and LFTs with labs today. Continue Atorvastatin. 

## 2014-06-13 NOTE — Assessment & Plan Note (Signed)
Will check TSH with labs today. 

## 2014-06-13 NOTE — Progress Notes (Signed)
Subjective:    Patient ID: Cynthia [name], female    DOB: [birth year], 68 y.o.   MRN: [id number]  HPI 68YO female presents for follow up.  Feeling tired recently. Short of breath with minimal exertion such as walking a short distance. No chest pain, palpitations. No edema.  DM - BG running in 90s-100s. Compliant with medications.   Review of Systems  Constitutional: Positive for fatigue. Negative for fever, chills, appetite change and unexpected weight change.  Eyes: Negative for visual disturbance.  Respiratory: Positive for shortness of breath. Negative for cough, chest tightness and wheezing.   Cardiovascular: Negative for chest pain and leg swelling.  Gastrointestinal: Negative for nausea, vomiting, abdominal pain, diarrhea and constipation.  Skin: Negative for color change and rash.  Hematological: Negative for adenopathy. Does not bruise/bleed easily.  Psychiatric/Behavioral: Negative for sleep disturbance and dysphoric mood. The patient is not nervous/anxious.        Objective:    BP 122/80  Pulse 94  Temp(Src) 98.3 F (36.8 C) (Oral)  Ht 5' 8.5" (1.74 m)  Wt 240 lb 8 oz (109.09 kg)  BMI 36.03 kg/m2  SpO2 96% Physical Exam  Constitutional: She is oriented to person, place, and time. She appears well-developed and well-nourished. No distress.  HENT:  Head: Normocephalic and atraumatic.  Right Ear: External ear normal.  Left Ear: External ear normal.  Nose: Nose normal.  Mouth/Throat: Oropharynx is clear and moist. No oropharyngeal exudate.  Eyes: Conjunctivae are normal. Pupils are equal, round, and reactive to light. Right eye exhibits no discharge. Left eye exhibits no discharge. No scleral icterus.  Neck: Normal range of motion. Neck supple. No tracheal deviation present. No thyromegaly present.  Cardiovascular: Normal rate, regular rhythm, normal heart sounds and intact distal pulses.  Exam reveals no gallop and no friction rub.   No murmur  heard. Pulmonary/Chest: Effort normal and breath sounds normal. No accessory muscle usage. Not tachypneic. No respiratory distress. She has no decreased breath sounds. She has no wheezes. She has no rhonchi. She has no rales. She exhibits no tenderness.  Musculoskeletal: Normal range of motion. She exhibits no edema and no tenderness.  Lymphadenopathy:    She has no cervical adenopathy.  Neurological: She is alert and oriented to person, place, and time. No cranial nerve deficit. She exhibits normal muscle tone. Coordination normal.  Skin: Skin is warm and dry. No rash noted. She is not diaphoretic. No erythema. No pallor.  Psychiatric: She has a normal mood and affect. Her behavior is normal. Judgment and thought content normal.          Assessment & Plan:   Problem List Items Addressed This Visit     Unprioritized   Diabetes mellitus, type 2 - Primary      Lab Results  Component Value Date   HGBA1C 6.3 03/07/2014   Will check A1c with labs. Continue Metformin. Eye exam and foot exam UTD.    Relevant Orders      Comprehensive metabolic panel      Hemoglobin A1c      Lipid panel      Microalbumin / creatinine urine ratio   Dyspnea on exertion     Recent worsening dyspnea on exertion. Exam normal. Patient has several risk factors for CAD including DM, HL, history of tobacco use. Will set up cardiology evaluation for possible stress test/ECHO.    Relevant Orders      Ambulatory referral to Cardiology      EKG 12-Lead  Hyperlipidemia     Will check lipids and LFTs with labs today. Continue Atorvastatin.    Hypertension      BP Readings from Last 3 Encounters:  06/13/14 122/80  03/07/14 102/74  02/14/14 120/72   BP well controlled on current medications. Will check renal function with labs.    Hypothyroidism     Will check TSH with labs today.    Relevant Orders      TSH       Return in about 3 months (around 09/12/2014) for Recheck of Diabetes.

## 2014-06-14 ENCOUNTER — Encounter: Payer: Self-pay | Admitting: *Deleted

## 2014-06-19 LAB — HM PAP SMEAR: HM PAP: NEGATIVE

## 2014-06-25 ENCOUNTER — Other Ambulatory Visit: Payer: Self-pay | Admitting: Internal Medicine

## 2014-06-27 ENCOUNTER — Encounter: Payer: Self-pay | Admitting: Cardiovascular Disease

## 2014-06-27 ENCOUNTER — Ambulatory Visit (INDEPENDENT_AMBULATORY_CARE_PROVIDER_SITE_OTHER): Payer: Medicare Other | Admitting: Cardiovascular Disease

## 2014-06-27 VITALS — BP 134/91 | HR 88 | Ht 66.0 in | Wt 239.5 lb

## 2014-06-27 DIAGNOSIS — R0609 Other forms of dyspnea: Secondary | ICD-10-CM

## 2014-06-27 DIAGNOSIS — R0602 Shortness of breath: Secondary | ICD-10-CM

## 2014-06-27 DIAGNOSIS — E785 Hyperlipidemia, unspecified: Secondary | ICD-10-CM

## 2014-06-27 DIAGNOSIS — I10 Essential (primary) hypertension: Secondary | ICD-10-CM

## 2014-06-27 NOTE — Assessment & Plan Note (Signed)
Lab Results  Component Value Date   CHOL 147 06/13/2014   HDL 61.20 06/13/2014   LDLCALC 74 06/13/2014   TRIG 59.0 06/13/2014   CHOLHDL 2 06/13/2014   Continue treatment with atorvastatin with a target LDL of less than 100.

## 2014-06-27 NOTE — Assessment & Plan Note (Signed)
Blood pressure is reasonably controlled on current medications. 

## 2014-06-27 NOTE — Assessment & Plan Note (Signed)
The patient has dyspnea with minimal activities without chest discomfort. This very well could be angina equivalent considering her multiple risk factors for coronary artery disease including diabetes. Cardiac exam is unremarkable and baseline ECG is normal. I recommend evaluation with a treadmill nuclear stress test. If stress testing is negative, I advised him to start an exercise program and attempt weight loss. Continue aggressive treatment of risk factors.

## 2014-06-27 NOTE — Patient Instructions (Addendum)
[place]  Your caregiver has ordered a Stress Test with nuclear imaging. The purpose of this test is to evaluate the blood supply to your heart muscle. This procedure is referred to as a "Non-Invasive Stress Test." This is because other than having an IV started in your vein, nothing is inserted or "invades" your body. Cardiac stress tests are done to find areas of poor blood flow to the heart by determining the extent of coronary artery disease (CAD). Some patients exercise on a treadmill, which naturally increases the blood flow to your heart, while others who are  unable to walk on a treadmill due to physical limitations have a pharmacologic/chemical stress agent called Lexiscan . This medicine will mimic walking on a treadmill by temporarily increasing your coronary blood flow.   Please note: these test may take anywhere between 2-4 hours to complete  PLEASE REPORT TO [facility] AT THE FIRST DESK WILL DIRECT YOU WHERE TO GO  Date of Procedure:_____10/12/15________________________________  Arrival Time for Procedure:_______0745am_______________________  Instructions regarding medication:   __x__ : Hold diabetes medication morning of procedure    PLEASE NOTIFY THE OFFICE AT LEAST 24 HOURS IN ADVANCE IF YOU ARE UNABLE TO KEEP YOUR APPOINTMENT.  [phone] AND  PLEASE NOTIFY NUCLEAR MEDICINE AT [facility] AT LEAST 24 HOURS IN ADVANCE IF YOU ARE UNABLE TO KEEP YOUR APPOINTMENT. [phone]  How to prepare for your Myoview test:  1. Do not eat or drink after midnight 2. No caffeine for 24 hours prior to test 3. No smoking 24 hours prior to test. 4. Your medication may be taken with water.  If your doctor stopped a medication because of this test, do not take that medication. 5. Ladies, please do not wear dresses.  Skirts or pants are appropriate. Please wear a short sleeve shirt. 6. No perfume, cologne or lotion. 7. Wear comfortable walking shoes. No  heels!  Your physician recommends that you schedule a follow-up appointment in:  As needed

## 2014-06-27 NOTE — Progress Notes (Signed)
Primary care physician: Dr. Gilford Rile  HPI  This is a pleasant 68 year old African American female who was referred for evaluation of exertional dyspnea. She has no previous cardiac history. She has multiple chronic medical conditions that include type 2 diabetes of at least 5 years of duration, hypertension, hyperlipidemia, hypothyroidism and obesity. She is not a smoker and has no family history of coronary artery disease. She reports a weight gain of 40 pounds over the last year with gradual worsening of dyspnea which is currently happening with minimal activities. No orthopnea, PND or lower extremity edema. She denies any chest discomfort. No previous cardiac workup.  No Known Allergies   Current Outpatient Prescriptions on File Prior to Visit  Medication Sig Dispense Refill  . amLODipine (NORVASC) 10 MG tablet Take 1 tablet (10 mg total) by mouth daily.  90 tablet  3  . aspirin 81 MG tablet Take 81 mg by mouth daily.      Marland Kitchen atorvastatin (LIPITOR) 20 MG tablet Take 1 tablet (20 mg total) by mouth daily.  90 tablet  4  . Calcium Carbonate-Vitamin D 600-400 MG-UNIT per tablet Take 1 tablet by mouth daily.      Marland Kitchen glucose blood test strip Use as instructed  100 each  12  . Lancets MISC Checks blood glucose twice daily. Diagnosis Code 250.  100 each  6  . levothyroxine (SYNTHROID, LEVOTHROID) 125 MCG tablet TAKE 1 TABLET BY MOUTH ONCE A DAY BEFORE BREAKFAST. NEED APPT FOR LABS.  30 tablet  0  . losartan-hydrochlorothiazide (HYZAAR) 50-12.5 MG per tablet Take 1 tablet by mouth daily.  90 tablet  3  . meloxicam (MOBIC) 15 MG tablet TAKE 1 TABLET BY MOUTH ONCE A DAY  90 tablet  2  . metFORMIN (GLUCOPHAGE) 500 MG tablet Take 1 tablet (500 mg total) by mouth 2 (two) times daily with a meal.  180 tablet  3  . traMADol (ULTRAM) 50 MG tablet Take 1 tablet (50 mg total) by mouth every 8 (eight) hours as needed for pain.  90 tablet  3   No current facility-administered medications on file prior to visit.       Past Medical History  Diagnosis Date  . Arthritis   . Hypertension   . Diabetes mellitus without complication   . Thyroid disease   . Hyperlipidemia   . Lung nodule      Past Surgical History  Procedure Laterality Date  . Vaginal delivery      4  . Thyroidectomy      Dr. [name]     Family History  Problem Relation Age of Onset  . Hypertension Mother   . Hypertension Father   . Diabetes Sister      History   Social History  . Marital Status: Single    Spouse Name: N/A    Number of Children: N/A  . Years of Education: N/A   Occupational History  . Not on file.   Social History Main Topics  . Smoking status: Former Smoker -- 20 years    Types: Cigarettes    Quit date: 11/17/2012  . Smokeless tobacco: Current User  . Alcohol Use: No  . Drug Use: No  . Sexual Activity: Not on file   Other Topics Concern  . Not on file   Social History Narrative   Lives in [place] with husband. Has 4 children.      Work - Retired from [employer]  review of system was performed. It is negative other than that mentioned in the history of present illness.   PHYSICAL EXAM   BP 134/91  Pulse 88  Ht 5\' 6"  (1.676 m)  Wt 239 lb 8 oz (108.636 kg)  BMI 38.67 kg/m2 Constitutional: She is oriented to person, place, and time. She appears well-developed and well-nourished. No distress.  HENT: No nasal discharge.  Head: Normocephalic and atraumatic.  Eyes: Pupils are equal and round. No discharge.  Neck: Normal range of motion. Neck supple. No JVD present. No thyromegaly present.  Cardiovascular: Normal rate, regular rhythm, normal heart sounds. Exam reveals no gallop and no friction rub. No murmur heard.  Pulmonary/Chest: Effort normal and breath sounds normal. No stridor. No respiratory distress. She has no wheezes. She has no rales. She exhibits no tenderness.  Abdominal: Soft. Bowel sounds are normal. She exhibits no distension. There is no  tenderness. There is no rebound and no guarding.  Musculoskeletal: Normal range of motion. She exhibits no edema and no tenderness.  Neurological: She is alert and oriented to person, place, and time. Coordination normal.  Skin: Skin is warm and dry. No rash noted. She is not diaphoretic. No erythema. No pallor.  Psychiatric: She has a normal mood and affect. Her behavior is normal. Judgment and thought content normal.     [id number]  Rhythm  WITHIN NORMAL LIMITS   ASSESSMENT AND PLAN

## 2014-06-30 ENCOUNTER — Other Ambulatory Visit: Payer: Self-pay

## 2014-06-30 ENCOUNTER — Ambulatory Visit: Payer: Self-pay | Admitting: Cardiovascular Disease

## 2014-06-30 DIAGNOSIS — R0602 Shortness of breath: Secondary | ICD-10-CM

## 2014-07-21 ENCOUNTER — Other Ambulatory Visit: Payer: Self-pay | Admitting: Internal Medicine

## 2014-08-15 ENCOUNTER — Other Ambulatory Visit: Payer: Self-pay | Admitting: Internal Medicine

## 2014-08-20 ENCOUNTER — Telehealth: Payer: Self-pay | Admitting: Internal Medicine

## 2014-08-20 DIAGNOSIS — Z1231 Encounter for screening mammogram for malignant neoplasm of breast: Secondary | ICD-10-CM

## 2014-08-20 NOTE — Telephone Encounter (Signed)
Patient needs the [facility] referral processed. The order is in the system.

## 2014-08-20 NOTE — Telephone Encounter (Signed)
Patient would like a referral to get a mammogram at [facility].  She is not having any problems.

## 2014-08-20 NOTE — Telephone Encounter (Signed)
Referral placed.

## 2014-08-27 ENCOUNTER — Other Ambulatory Visit: Payer: Self-pay | Admitting: *Deleted

## 2014-08-27 MED ORDER — GLUCOSE BLOOD VI STRP: ORAL_STRIP | Status: DC

## 2014-09-15 ENCOUNTER — Encounter: Payer: Self-pay | Admitting: Internal Medicine

## 2014-09-15 ENCOUNTER — Ambulatory Visit (INDEPENDENT_AMBULATORY_CARE_PROVIDER_SITE_OTHER): Payer: Medicare Other | Admitting: Internal Medicine

## 2014-09-15 VITALS — BP 116/77 | HR 87 | Temp 98.2°F | Ht 66.0 in | Wt 242.0 lb

## 2014-09-15 DIAGNOSIS — E785 Hyperlipidemia, unspecified: Secondary | ICD-10-CM

## 2014-09-15 DIAGNOSIS — I10 Essential (primary) hypertension: Secondary | ICD-10-CM

## 2014-09-15 DIAGNOSIS — E039 Hypothyroidism, unspecified: Secondary | ICD-10-CM

## 2014-09-15 DIAGNOSIS — R938 Abnormal findings on diagnostic imaging of other specified body structures: Secondary | ICD-10-CM

## 2014-09-15 DIAGNOSIS — R9389 Abnormal findings on diagnostic imaging of other specified body structures: Secondary | ICD-10-CM

## 2014-09-15 DIAGNOSIS — E119 Type 2 diabetes mellitus without complications: Secondary | ICD-10-CM

## 2014-09-15 LAB — COMPREHENSIVE METABOLIC PANEL
ALT: 22 U/L (ref 0–35)
AST: 25 U/L (ref 0–37)
Albumin: 4.1 g/dL (ref 3.5–5.2)
Alkaline Phosphatase: 62 U/L (ref 39–117)
BUN: 14 mg/dL (ref 6–23)
CO2: 29 mEq/L (ref 19–32)
CREATININE: 0.6 mg/dL (ref 0.4–1.2)
Calcium: 9.7 mg/dL (ref 8.4–10.5)
Chloride: 101 mEq/L (ref 96–112)
GFR: 118.35 mL/min (ref >60.00)
GLUCOSE: 71 mg/dL (ref 70–99)
Potassium: 3.9 mEq/L (ref 3.5–5.1)
Sodium: 137 mEq/L (ref 135–145)
Total Bilirubin: 0.4 mg/dL (ref 0.2–1.2)
Total Protein: 7.4 g/dL (ref 6.0–8.3)

## 2014-09-15 LAB — MICROALBUMIN / CREATININE URINE RATIO
Creatinine,U: 164.1 mg/dL
Microalb Creat Ratio: 1 mg/g (ref 0.0–30.0)
Microalb, Ur: 1.6 mg/dL (ref 0.0–1.9)

## 2014-09-15 LAB — LIPID PANEL
Cholesterol: 146 mg/dL (ref 0–200)
HDL: 54.8 mg/dL (ref >39.00)
LDL CALC: 72 mg/dL (ref 0–99)
NonHDL: 91.2
Total CHOL/HDL Ratio: 3
Triglycerides: 97 mg/dL (ref 0.0–149.0)
VLDL: 19.4 mg/dL (ref 0.0–40.0)

## 2014-09-15 LAB — HEMOGLOBIN A1C: Hgb A1c MFr Bld: 6.5 % (ref 4.6–6.5)

## 2014-09-15 NOTE — Assessment & Plan Note (Signed)
Will check lipids and LFTs with labs. Continue Atorvastatin. 

## 2014-09-15 NOTE — Assessment & Plan Note (Signed)
Continue Levothyroxine. TSH normal 05/2014.

## 2014-09-15 NOTE — Assessment & Plan Note (Signed)
BP Readings from Last 3 Encounters:  09/15/14 116/77  06/27/14 134/91  06/13/14 122/80   BP well controlled. Renal function with labs. Continue current medication.

## 2014-09-15 NOTE — Assessment & Plan Note (Signed)
Will check A1c with labs. Continue Metformin. Eye exam and foot exam UTD.

## 2014-09-15 NOTE — Progress Notes (Signed)
Pre visit review using our clinic review tool, if applicable. No additional management support is needed unless otherwise documented below in the visit note. 

## 2014-09-15 NOTE — Progress Notes (Signed)
Subjective:    Patient ID: Cynthia [name], female    DOB: [birth year], 68 y.o.   MRN: [id number]  HPI 68YO female presents for follow up.  DM - BG well controlled, but did not bring record. Compliant with medications.  Would like to get back in touch with Dr. Genevive Bi. Has not followed up abnormal chest CT since 02/2013 and was unsure of planned follow up. No symptoms of cough, chest pain.  Feeling well with no concerns.  Wt Readings from Last 3 Encounters:  09/15/14 242 lb (109.77 kg)  06/27/14 239 lb 8 oz (108.636 kg)  06/13/14 240 lb 8 oz (109.09 kg)     Past medical, surgical, family and social history per today's encounter.  Review of Systems  Constitutional: Negative for fever, chills, appetite change, fatigue and unexpected weight change.  Eyes: Negative for visual disturbance.  Respiratory: Negative for cough, chest tightness and shortness of breath.   Cardiovascular: Negative for chest pain and leg swelling.  Gastrointestinal: Negative for nausea, vomiting, abdominal pain, diarrhea and constipation.  Musculoskeletal: Negative for myalgias and arthralgias.  Skin: Negative for color change and rash.  Hematological: Negative for adenopathy. Does not bruise/bleed easily.  Psychiatric/Behavioral: Negative for dysphoric mood. The patient is not nervous/anxious.        Objective:    BP 116/77 mmHg  Pulse 87  Temp(Src) 98.2 F (36.8 C) (Oral)  Ht 5\' 6"  (1.676 m)  Wt 242 lb (109.77 kg)  BMI 39.08 kg/m2  SpO2 97% Physical Exam  Constitutional: She is oriented to person, place, and time. She appears well-developed and well-nourished. No distress.  HENT:  Head: Normocephalic and atraumatic.  Right Ear: External ear normal.  Left Ear: External ear normal.  Nose: Nose normal.  Mouth/Throat: Oropharynx is clear and moist. No oropharyngeal exudate.  Eyes: Conjunctivae are normal. Pupils are equal, round, and reactive to light. Right eye exhibits no discharge. Left eye  exhibits no discharge. No scleral icterus.  Neck: Normal range of motion. Neck supple. No tracheal deviation present. No thyromegaly present.  Cardiovascular: Normal rate, regular rhythm, normal heart sounds and intact distal pulses.  Exam reveals no gallop and no friction rub.   No murmur heard. Pulmonary/Chest: Effort normal and breath sounds normal. No accessory muscle usage. No tachypnea. No respiratory distress. She has no decreased breath sounds. She has no wheezes. She has no rhonchi. She has no rales. She exhibits no tenderness.  Musculoskeletal: Normal range of motion. She exhibits no edema or tenderness.  Lymphadenopathy:    She has no cervical adenopathy.  Neurological: She is alert and oriented to person, place, and time. No cranial nerve deficit. She exhibits normal muscle tone. Coordination normal.  Skin: Skin is warm and dry. No rash noted. She is not diaphoretic. No erythema. No pallor.  Psychiatric: She has a normal mood and affect. Her behavior is normal. Judgment and thought content normal.          Assessment & Plan:   Problem List Items Addressed This Visit      Unprioritized   Abnormal CT scan, chest    Will set up follow up with Dr. Genevive Bi. She reports scan was completed in 2015, however records not available at time of visit. Last record we have on CT 02/2013 showed stable left sided nodule.    Relevant Orders      Ambulatory referral to Oncology   Diabetes mellitus, type 2 - Primary    Will check A1c with labs. Continue  Metformin. Eye exam and foot exam UTD.    Relevant Orders      Comprehensive metabolic panel      Hemoglobin A1c      Lipid panel      Microalbumin / creatinine urine ratio   Hyperlipidemia    Will check lipids and LFTs with labs. Continue Atorvastatin.    Hypertension    BP Readings from Last 3 Encounters:  09/15/14 116/77  06/27/14 134/91  06/13/14 122/80   BP well controlled. Renal function with labs. Continue current medication.      Hypothyroidism    Continue Levothyroxine. TSH normal 05/2014.        Return in about 3 months (around 12/15/2014) for Recheck of Diabetes.

## 2014-09-15 NOTE — Assessment & Plan Note (Signed)
Will set up follow up with Dr. Genevive Bi. She reports scan was completed in 2015, however records not available at time of visit. Last record we have on CT 02/2013 showed stable left sided nodule.

## 2014-09-15 NOTE — Patient Instructions (Signed)
Labs today.   Follow up in 3 months.  

## 2014-09-17 ENCOUNTER — Encounter: Payer: Self-pay | Admitting: *Deleted

## 2014-09-25 ENCOUNTER — Ambulatory Visit: Payer: Self-pay | Admitting: Cardiothoracic Surgery

## 2014-09-25 ENCOUNTER — Ambulatory Visit: Payer: Self-pay | Admitting: Internal Medicine

## 2014-09-25 DIAGNOSIS — J984 Other disorders of lung: Secondary | ICD-10-CM | POA: Diagnosis not present

## 2014-09-25 DIAGNOSIS — R911 Solitary pulmonary nodule: Secondary | ICD-10-CM | POA: Diagnosis not present

## 2014-09-25 DIAGNOSIS — R918 Other nonspecific abnormal finding of lung field: Secondary | ICD-10-CM | POA: Diagnosis not present

## 2014-09-25 DIAGNOSIS — Z1231 Encounter for screening mammogram for malignant neoplasm of breast: Secondary | ICD-10-CM | POA: Diagnosis not present

## 2014-09-25 DIAGNOSIS — I2510 Atherosclerotic heart disease of native coronary artery without angina pectoris: Secondary | ICD-10-CM | POA: Diagnosis not present

## 2014-09-25 LAB — HM MAMMOGRAPHY

## 2014-09-25 IMAGING — MG MM DIGITAL SCREENING BILAT W/ TOMO W/ CAD
8 of 12 series · 8 of 28 positions shown · non-contrast
Comparison: [DATE] and earlier.

CLINICAL DATA: Screening.

EXAM:
DIGITAL SCREENING BILATERAL MAMMOGRAM WITH 3D TOMO WITH CAD

[L CC synth-2D]
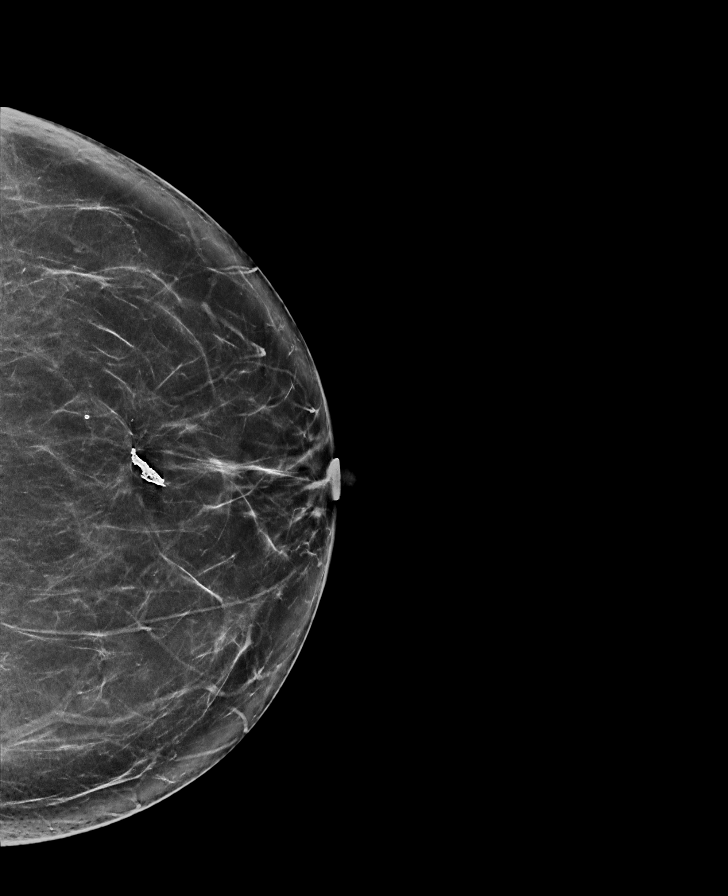

[L CC]
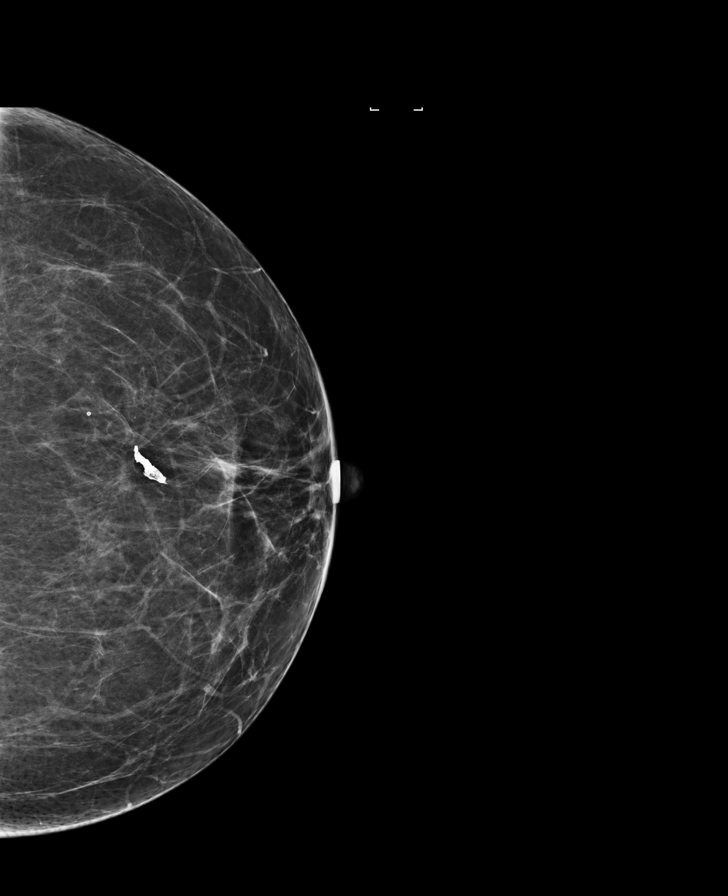

[L MLO synth-2D]
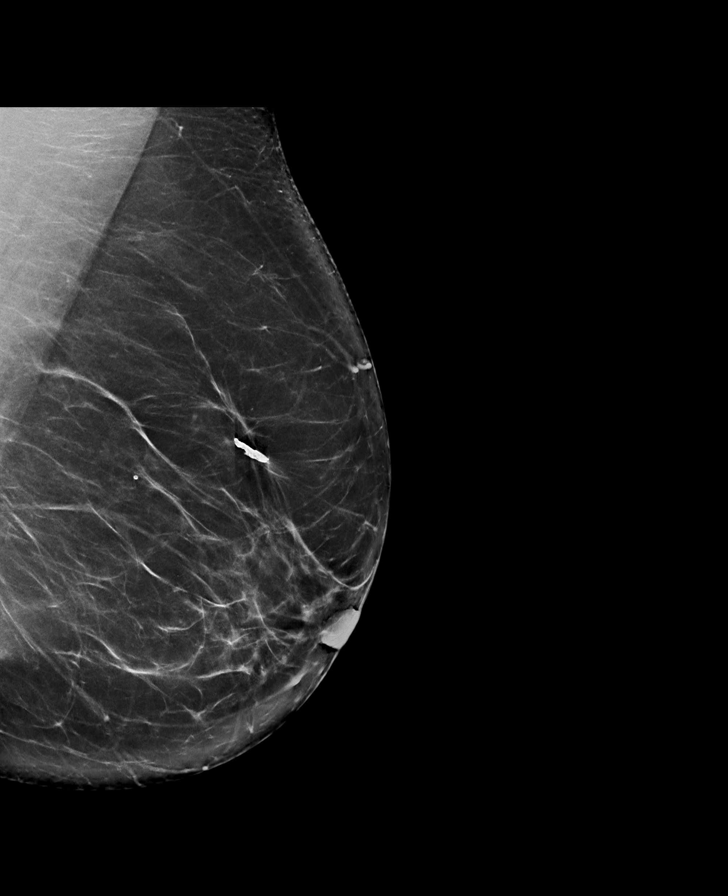

[R CC synth-2D]
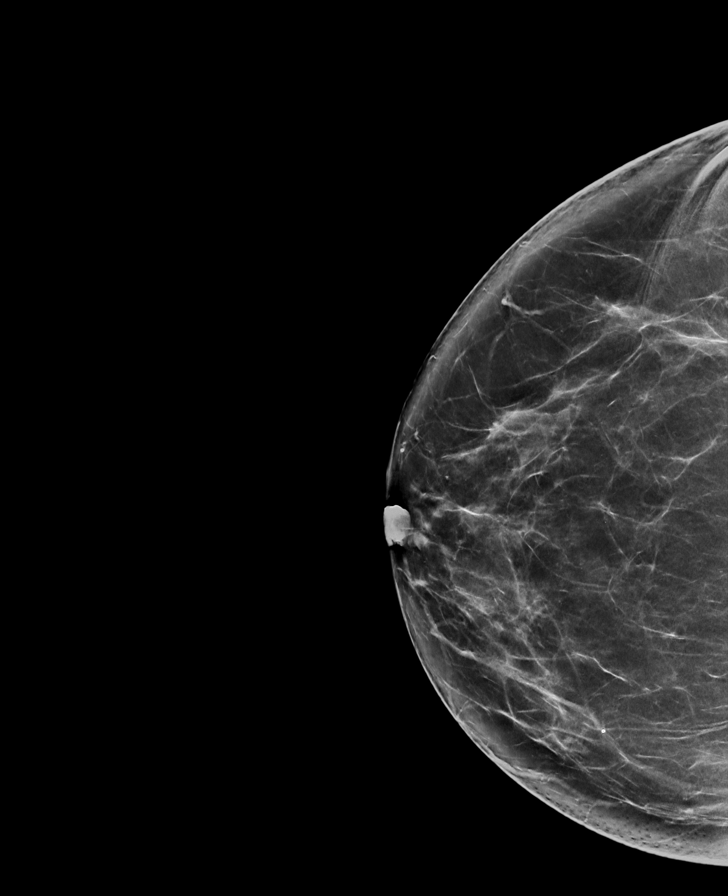

[R MLO]
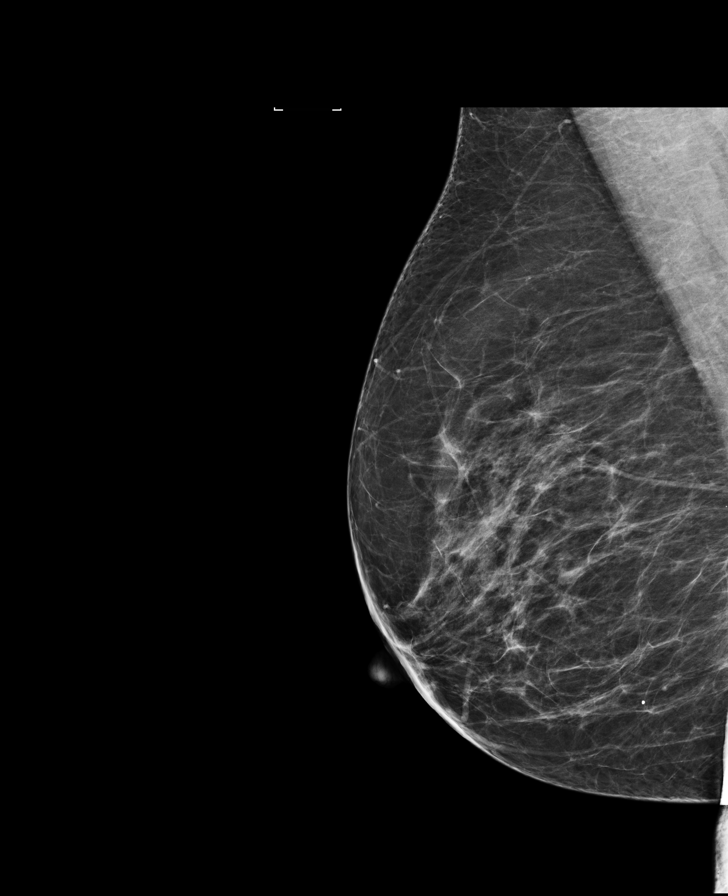

[L MLO]
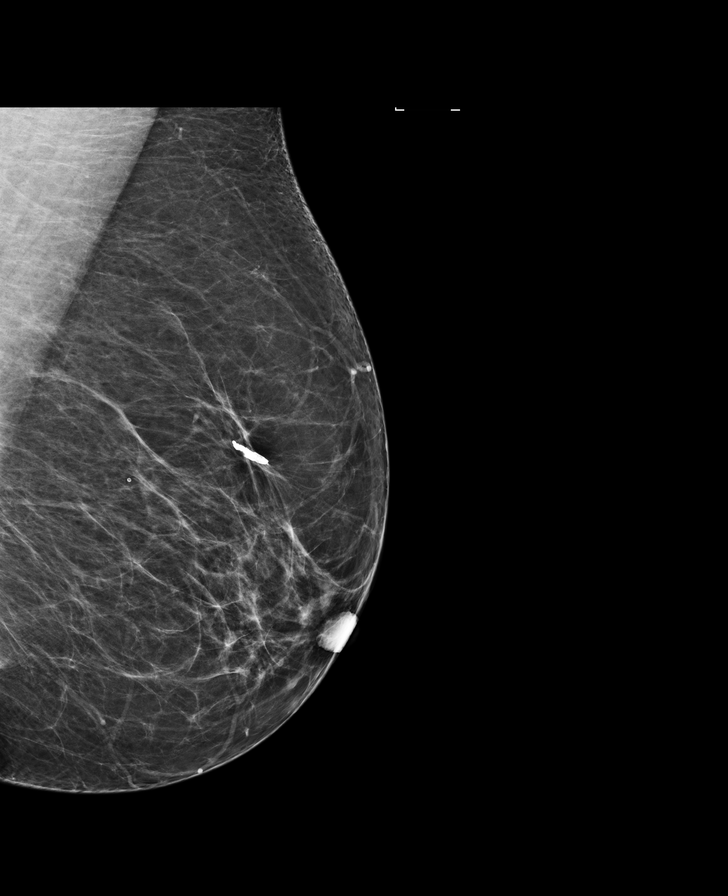

[R MLO synth-2D]
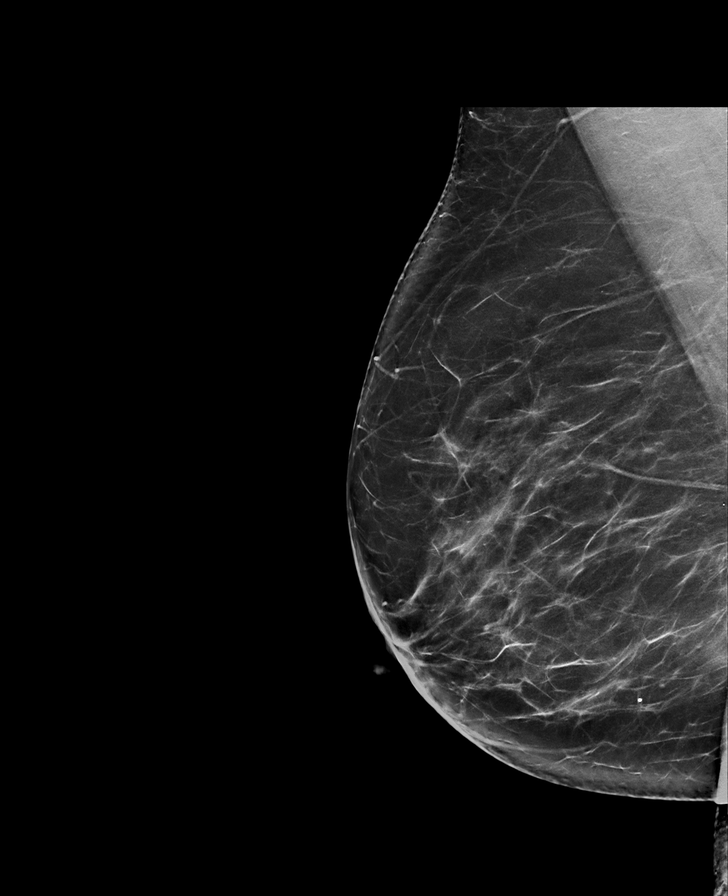

[R CC]
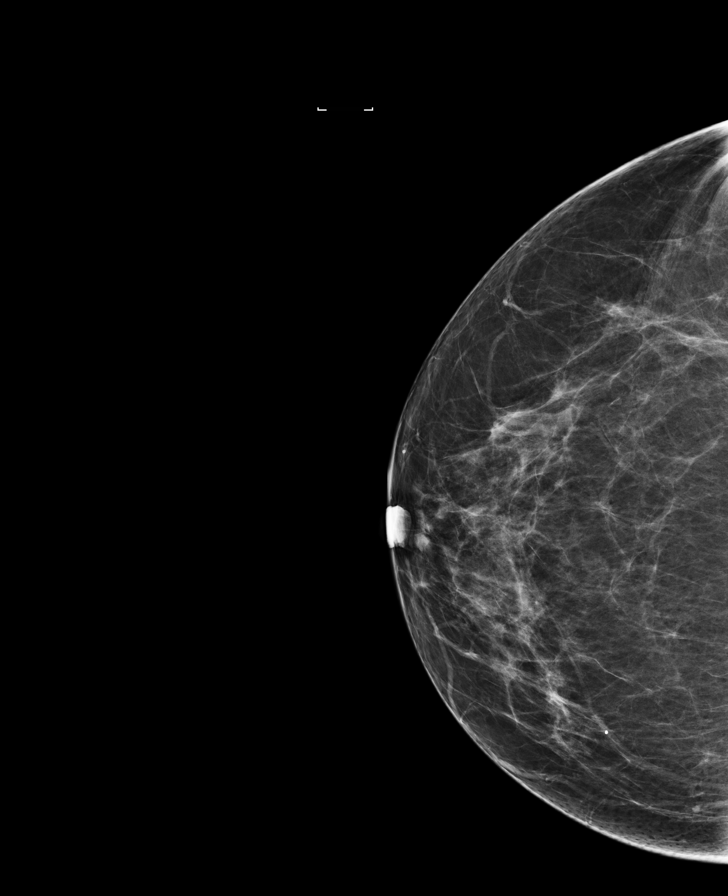

[8 of 28 positions shown; findings below may reference images not displayed]

ACR Breast Density Category b: There are scattered areas of
fibroglandular density.
FINDINGS: There are no findings suspicious for malignancy. Postsurgical
changes are noted in the left breast. Images were processed with
CAD.
IMPRESSION: No mammographic evidence of malignancy. A result letter of this
screening mammogram will be mailed directly to the patient.

RECOMMENDATION:
Screening mammogram in one year. (Code:[YV])

BI-RADS CATEGORY  2: Benign.

## 2014-09-25 IMAGING — CT CT CHEST W/O CM
2 of 3 series · 16 of 46 positions shown, 18 images · non-contrast
Comparison: Chest CT [DATE].

CLINICAL DATA: 68-year-old female with history of pulmonary
nodules. Follow-up CT examination.

EXAM:
CT CHEST WITHOUT CONTRAST
TECHNIQUE: Multidetector CT imaging of the chest was performed following the
standard protocol without IV contrast..

[Series 2: routine chest wo · axial · 0.62mm/px · z∈[+712,+952]mm · 13 of 56 slices shown, 15 images]
[im 4/56  soft-tissue]
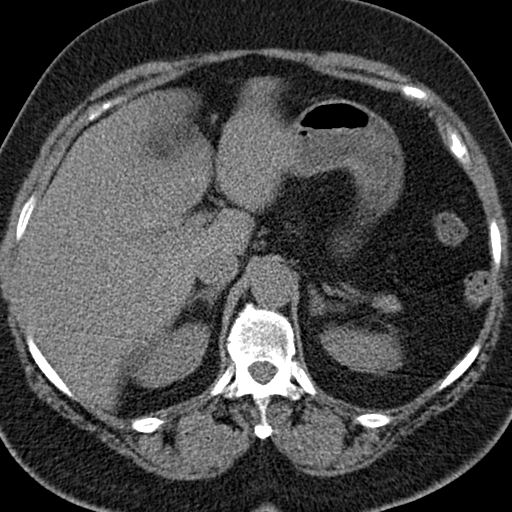
[im 4/56  bone]
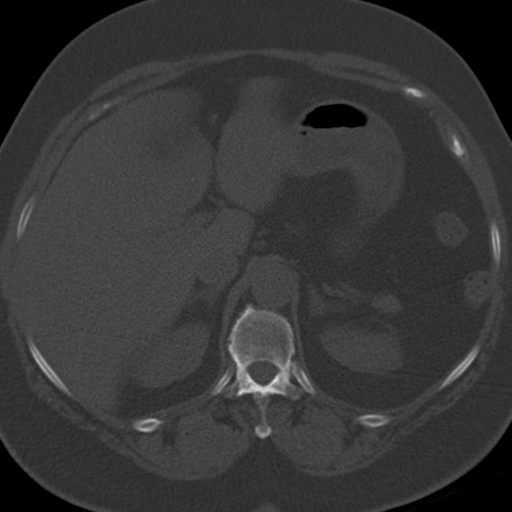
[im 8/56  soft-tissue]
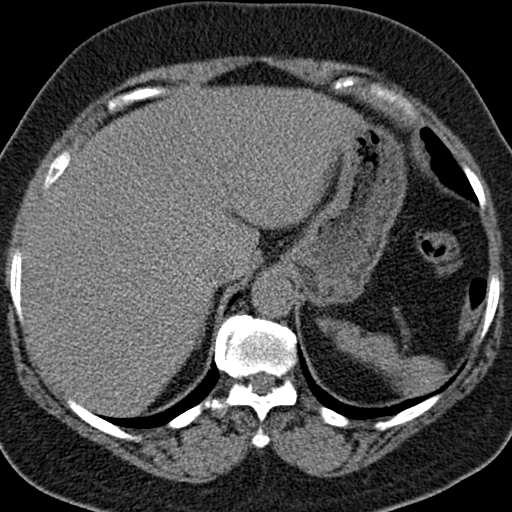
[im 11/56  soft-tissue]
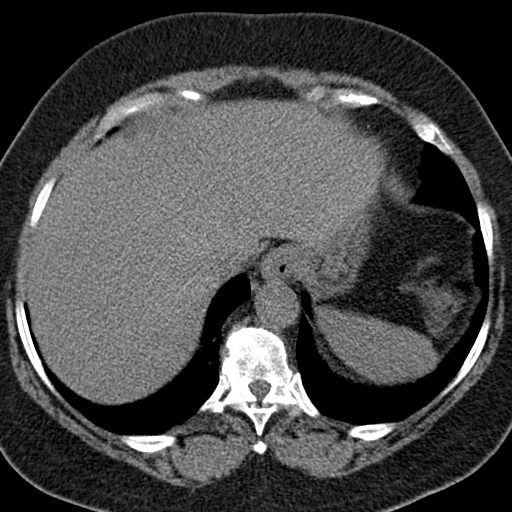
[im 16/56  soft-tissue]
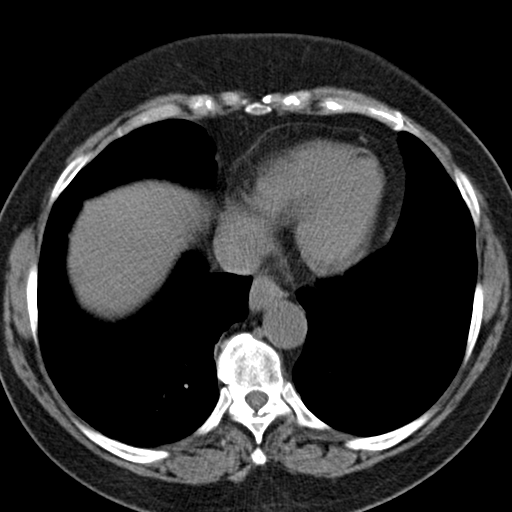
[im 20/56  soft-tissue]
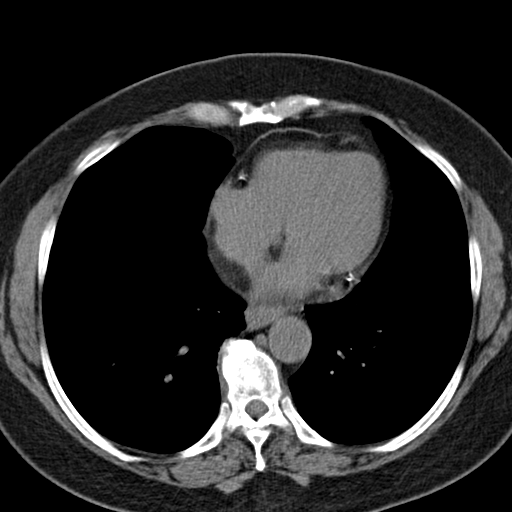
[im 24/56  soft-tissue]
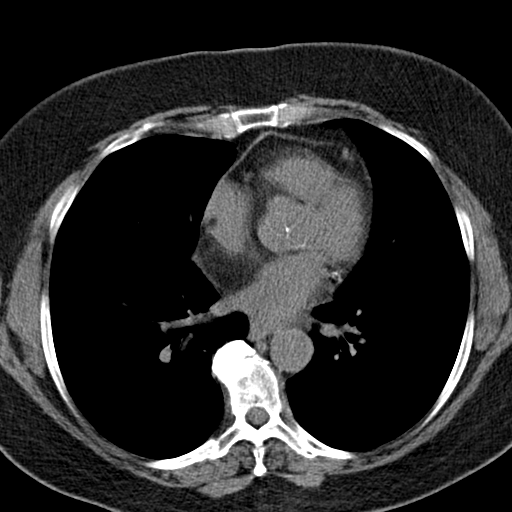
[im 29/56  soft-tissue]
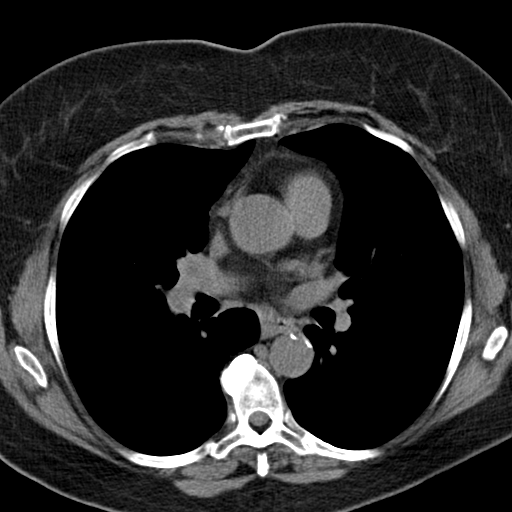
[im 32/56  soft-tissue]
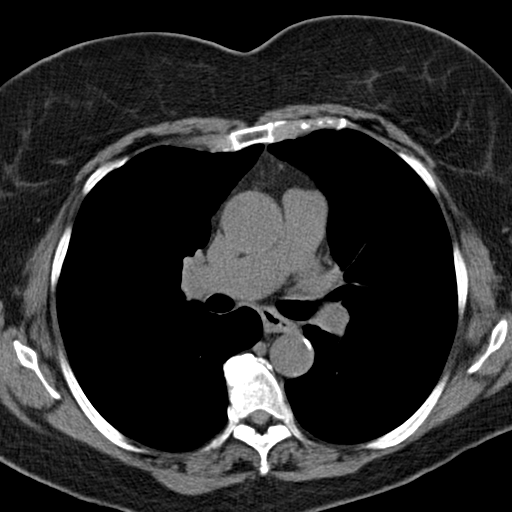
[im 36/56  soft-tissue]
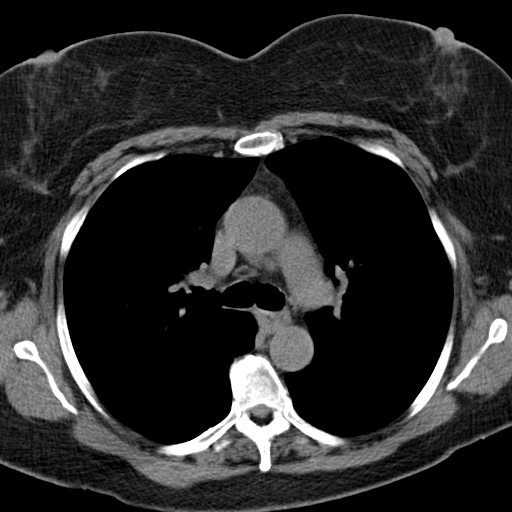
[im 36/56  bone]
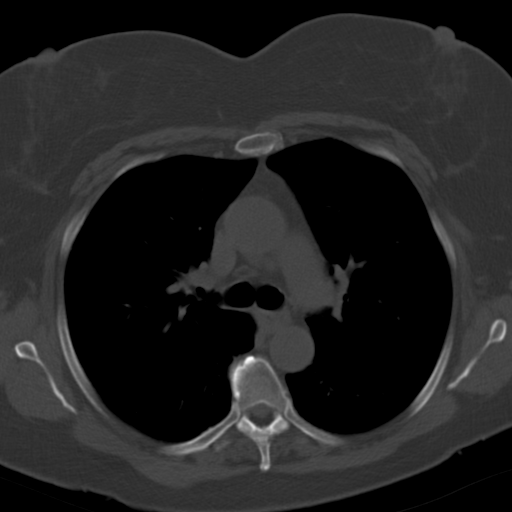
[im 40/56  soft-tissue]
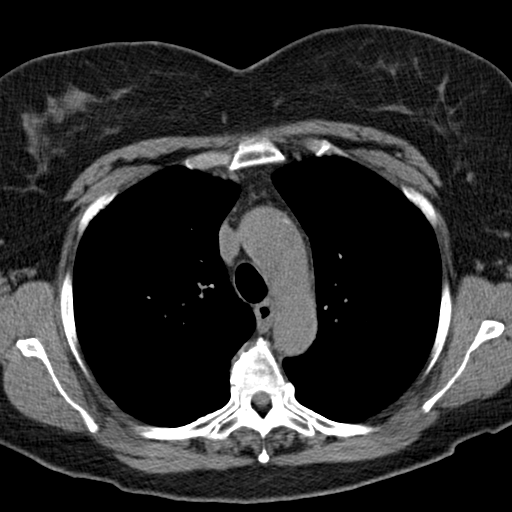
[im 45/56  soft-tissue]
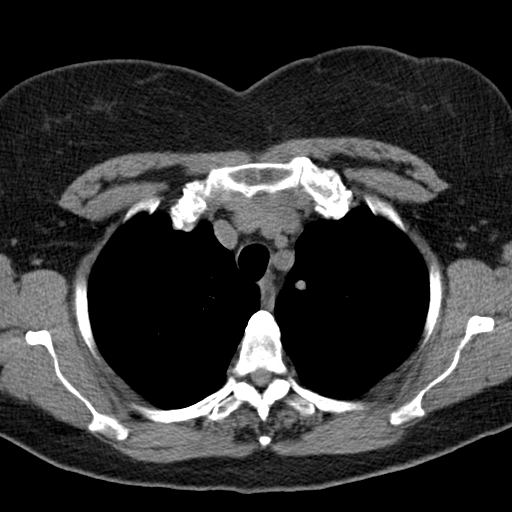
[im 48/56  soft-tissue]
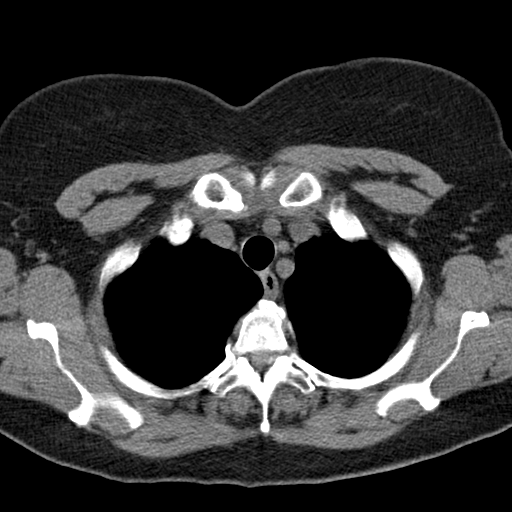
[im 52/56  soft-tissue]
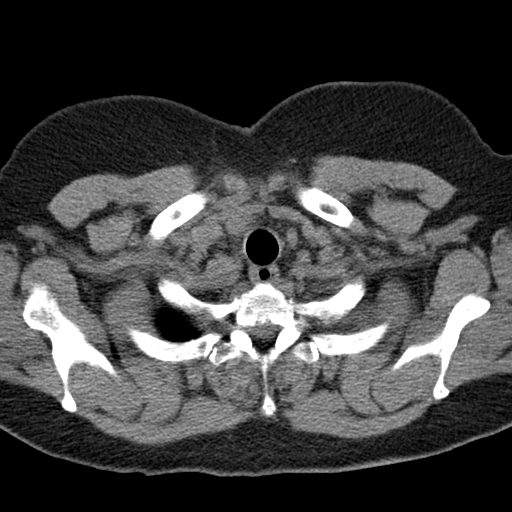

[Series 5: routine chest wo cor · coronal · 0.57mm/px · 3 of 146 slices shown]
[im 49/146  soft-tissue]
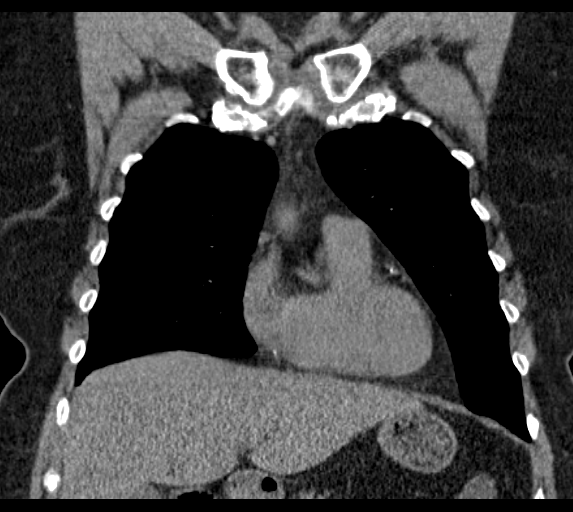
[im 65/146  soft-tissue]
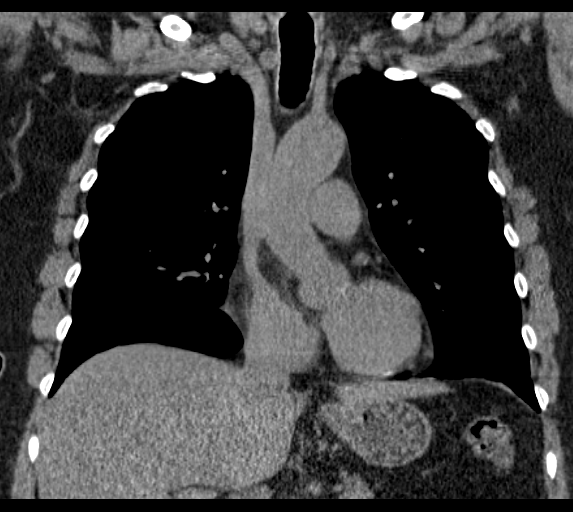
[im 81/146  soft-tissue]
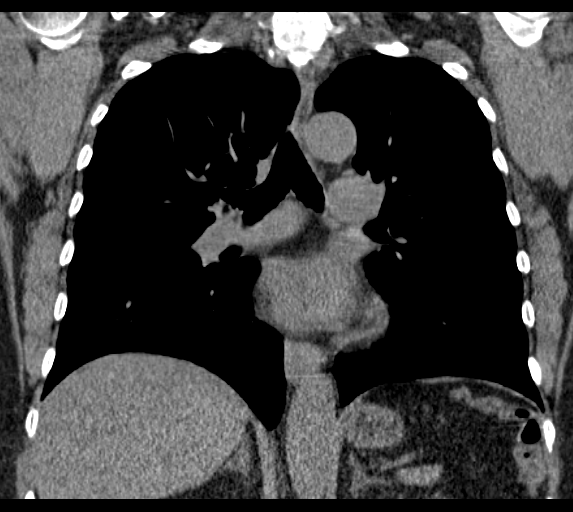

[16 of 46 positions shown; findings below may reference images not displayed]

FINDINGS: Mediastinum/Lymph Nodes: Heart size is normal. There is no
significant pericardial fluid, thickening or pericardial
calcification. There is atherosclerosis of the thoracic aorta, the
great vessels of the mediastinum and the coronary arteries,
including calcified atherosclerotic plaque in the left anterior
descending, left circumflex and right coronary arteries. Lipomatous
hypertrophy of the interatrial septum (normal variant) incidentally
noted. No pathologically enlarged mediastinal, hilar or axillary
lymph nodes. Please note that accurate exclusion of hilar adenopathy
is limited on noncontrast CT scans. Esophagus is unremarkable in
appearance.

Lungs/Pleura: 9 mm nodule in the medial aspect of the left upper
lobe (image 12 of series 3) is unchanged compared to prior studies
dating back to [DATE]. 3 mm nodule in the apex of the left upper
lobe (image 7 of series 3) is also unchanged. No new suspicious
appearing pulmonary nodules or masses are otherwise noted. No acute
consolidative airspace disease. No pleural effusions.

Musculoskeletal/Soft Tissues: There are no aggressive appearing
lytic or blastic lesions noted in the visualized portions of the
skeleton.

Upper Abdomen: Mild diffuse low attenuation throughout the hepatic
parenchyma, compatible with mild hepatic steatosis.
IMPRESSION: 1. No change in left upper lobe pulmonary nodules compared to prior
studies dating back to [DATE]. Given the 23.5 months of
stability, and lack of hypermetabolism in the larger lesion on prior
PET-CT [DATE], these are considered benign at this time, and no
further imaging followup is recommended.
2. Atherosclerosis, including 3 vessel coronary artery disease.
Please note that although the presence of coronary artery calcium
documents the presence of coronary artery disease, the severity of
this disease and any potential stenosis cannot be assessed on this
non-gated CT examination. Assessment for potential risk factor
modification, dietary therapy or pharmacologic therapy may be
warranted, if clinically indicated.
3. Lipomatous hypertrophy of the interatrial septum incidentally
noted.
4. Hepatic steatosis.

## 2014-10-09 ENCOUNTER — Encounter: Payer: Self-pay | Admitting: Internal Medicine

## 2014-10-20 ENCOUNTER — Ambulatory Visit: Payer: Self-pay | Admitting: Cardiothoracic Surgery

## 2014-10-29 ENCOUNTER — Encounter: Payer: Self-pay | Admitting: Internal Medicine

## 2014-10-29 ENCOUNTER — Ambulatory Visit (INDEPENDENT_AMBULATORY_CARE_PROVIDER_SITE_OTHER): Payer: Medicare Other | Admitting: Internal Medicine

## 2014-10-29 VITALS — BP 143/85 | HR 91 | Temp 98.1°F | Ht 66.0 in | Wt 243.5 lb

## 2014-10-29 DIAGNOSIS — I10 Essential (primary) hypertension: Secondary | ICD-10-CM | POA: Diagnosis not present

## 2014-10-29 DIAGNOSIS — R200 Anesthesia of skin: Secondary | ICD-10-CM

## 2014-10-29 DIAGNOSIS — E119 Type 2 diabetes mellitus without complications: Secondary | ICD-10-CM

## 2014-10-29 DIAGNOSIS — R202 Paresthesia of skin: Secondary | ICD-10-CM | POA: Diagnosis not present

## 2014-10-29 MED ORDER — LOSARTAN POTASSIUM-HCTZ 50-12.5 MG PO TABS: 1.0000 | ORAL_TABLET | Freq: Every day | ORAL | Status: DC

## 2014-10-29 NOTE — Assessment & Plan Note (Signed)
Recent episode of right arm numbness and weakness. Pt did not seek care at the time, however symptoms are concerning for CVA. Recommended MRI brain and carotid dopplers. She declines. Will monitor for any recurrent symptoms. Continue Aspirin and Atorvastatin. Follow up in 4 weeks.

## 2014-10-29 NOTE — Progress Notes (Signed)
Subjective:    Patient ID: Cynthia [name], female    DOB: May 11, 1946, 69 y.o.   MRN: [id number]  HPI  69YO female presents for follow up.  HTN - Unsure if she has been taking Losartan. Does not generally check BP at home. No chest pain, headache. BP Readings from Last 3 Encounters:  10/29/14 143/85  09/15/14 116/77  06/27/14 134/91   DM - BG all less than 150. Compliant with metformin.  About 3 months ago, had an episode in which her right arm became numb and weak. She was not able to move her arm. No slurred speech noted. Did not seek care for this. Symptoms resolved after about 53min.  Past medical, surgical, family and social history per today's encounter.  Review of Systems  Constitutional: Negative for fever, chills, appetite change, fatigue and unexpected weight change.  Eyes: Negative for visual disturbance.  Respiratory: Negative for shortness of breath.   Cardiovascular: Negative for chest pain and leg swelling.  Gastrointestinal: Negative for abdominal pain.  Skin: Negative for color change and rash.  Neurological: Positive for weakness and numbness. Negative for dizziness, tremors, syncope, facial asymmetry, speech difficulty, light-headedness and headaches.  Hematological: Negative for adenopathy. Does not bruise/bleed easily.  Psychiatric/Behavioral: Negative for sleep disturbance and dysphoric mood. The patient is not nervous/anxious.        Objective:    BP 143/85 mmHg  Pulse 91  Temp(Src) 98.1 F (36.7 C) (Oral)  Ht 5\' 6"  (1.676 m)  Wt 243 lb 8 oz (110.451 kg)  BMI 39.32 kg/m2  SpO2 97% Physical Exam  Constitutional: She is oriented to person, place, and time. She appears well-developed and well-nourished. No distress.  HENT:  Head: Normocephalic and atraumatic.  Right Ear: External ear normal.  Left Ear: External ear normal.  Nose: Nose normal.  Mouth/Throat: Oropharynx is clear and moist. No oropharyngeal exudate.  Eyes: Conjunctivae are  normal. Pupils are equal, round, and reactive to light. Right eye exhibits no discharge. Left eye exhibits no discharge. No scleral icterus.  Neck: Normal range of motion. Neck supple. No tracheal deviation present. No thyromegaly present.  Cardiovascular: Normal rate, regular rhythm, normal heart sounds and intact distal pulses.  Exam reveals no gallop and no friction rub.   No murmur heard. Pulmonary/Chest: Effort normal and breath sounds normal. No respiratory distress. She has no wheezes. She has no rales. She exhibits no tenderness.  Musculoskeletal: Normal range of motion. She exhibits no edema or tenderness.  Lymphadenopathy:    She has no cervical adenopathy.  Neurological: She is alert and oriented to person, place, and time. No cranial nerve deficit. She exhibits normal muscle tone. Coordination normal.  Skin: Skin is warm and dry. No rash noted. She is not diaphoretic. No erythema. No pallor.  Psychiatric: She has a normal mood and affect. Her behavior is normal. Judgment and thought content normal.          Assessment & Plan:   Problem List Items Addressed This Visit      Unprioritized   Diabetes mellitus, type 2    Pt reports good control of BG. Will continue Metformin. Recheck A1c in 11/2014.      Relevant Medications   losartan-hydrochlorothiazide (HYZAAR) 50-12.5 MG per tablet   Hypertension - Primary    BP Readings from Last 3 Encounters:  10/29/14 143/85  09/15/14 116/77  06/27/14 134/91   BP elevated, however has not been taking Losartan-HCTZ. Will resume medication. Continue Amlodipine. Recheck renal function in  1 week. Follow up in 4 weeks.      Relevant Medications   losartan-hydrochlorothiazide (HYZAAR) 50-12.5 MG per tablet   Other Relevant Orders   Basic Metabolic Panel (BMET)   Right arm numbness    Recent episode of right arm numbness and weakness. Pt did not seek care at the time, however symptoms are concerning for CVA. Recommended MRI brain and  carotid dopplers. She declines. Will monitor for any recurrent symptoms. Continue Aspirin and Atorvastatin. Follow up in 4 weeks.          Return in about 4 weeks (around 11/26/2014) for Recheck.

## 2014-10-29 NOTE — Patient Instructions (Addendum)
Start back on Losartan-HCTZ 50-12.5mg  daily to help control blood pressure.  Continue Amlodipine 10mg  daily for blood pressure.  Lab test in 1 week to check potassium.

## 2014-10-29 NOTE — Assessment & Plan Note (Signed)
BP Readings from Last 3 Encounters:  10/29/14 143/85  09/15/14 116/77  06/27/14 134/91   BP elevated, however has not been taking Losartan-HCTZ. Will resume medication. Continue Amlodipine. Recheck renal function in 1 week. Follow up in 4 weeks.

## 2014-10-29 NOTE — Assessment & Plan Note (Signed)
Pt reports good control of BG. Will continue Metformin. Recheck A1c in 11/2014.

## 2014-10-29 NOTE — Progress Notes (Signed)
Pre visit review using our clinic review tool, if applicable. No additional management support is needed unless otherwise documented below in the visit note. 

## 2014-10-31 ENCOUNTER — Other Ambulatory Visit: Payer: Self-pay

## 2014-11-07 ENCOUNTER — Other Ambulatory Visit (INDEPENDENT_AMBULATORY_CARE_PROVIDER_SITE_OTHER): Payer: Medicare Other

## 2014-11-07 ENCOUNTER — Encounter: Payer: Self-pay | Admitting: *Deleted

## 2014-11-07 DIAGNOSIS — I10 Essential (primary) hypertension: Secondary | ICD-10-CM

## 2014-11-07 LAB — BASIC METABOLIC PANEL
BUN: 14 mg/dL (ref 6–23)
CHLORIDE: 101 meq/L (ref 96–112)
CO2: 30 meq/L (ref 19–32)
CREATININE: 0.7 mg/dL (ref 0.40–1.20)
Calcium: 10.1 mg/dL (ref 8.4–10.5)
GFR: 106.68 mL/min (ref >60.00)
Glucose, Bld: 91 mg/dL (ref 70–99)
Potassium: 4.3 mEq/L (ref 3.5–5.1)
Sodium: 137 mEq/L (ref 135–145)

## 2014-12-17 ENCOUNTER — Encounter: Payer: Self-pay | Admitting: Internal Medicine

## 2014-12-17 ENCOUNTER — Ambulatory Visit (INDEPENDENT_AMBULATORY_CARE_PROVIDER_SITE_OTHER): Payer: Medicare Other | Admitting: Internal Medicine

## 2014-12-17 VITALS — BP 129/80 | HR 116 | Temp 98.4°F | Ht 66.0 in | Wt 244.0 lb

## 2014-12-17 DIAGNOSIS — E119 Type 2 diabetes mellitus without complications: Secondary | ICD-10-CM | POA: Diagnosis not present

## 2014-12-17 DIAGNOSIS — E785 Hyperlipidemia, unspecified: Secondary | ICD-10-CM | POA: Diagnosis not present

## 2014-12-17 DIAGNOSIS — I10 Essential (primary) hypertension: Secondary | ICD-10-CM | POA: Diagnosis not present

## 2014-12-17 DIAGNOSIS — R938 Abnormal findings on diagnostic imaging of other specified body structures: Secondary | ICD-10-CM | POA: Diagnosis not present

## 2014-12-17 DIAGNOSIS — R9389 Abnormal findings on diagnostic imaging of other specified body structures: Secondary | ICD-10-CM

## 2014-12-17 LAB — LIPID PANEL
Cholesterol: 161 mg/dL (ref 0–200)
HDL: 61.8 mg/dL (ref >39.00)
LDL Cholesterol: 88 mg/dL (ref 0–99)
NonHDL: 99.2
Total CHOL/HDL Ratio: 3
Triglycerides: 58 mg/dL (ref 0.0–149.0)
VLDL: 11.6 mg/dL (ref 0.0–40.0)

## 2014-12-17 LAB — COMPREHENSIVE METABOLIC PANEL
ALK PHOS: 63 U/L (ref 39–117)
ALT: 23 U/L (ref 0–35)
AST: 27 U/L (ref 0–37)
Albumin: 4.4 g/dL (ref 3.5–5.2)
BUN: 13 mg/dL (ref 6–23)
CHLORIDE: 100 meq/L (ref 96–112)
CO2: 28 mEq/L (ref 19–32)
Calcium: 10.2 mg/dL (ref 8.4–10.5)
Creatinine, Ser: 0.74 mg/dL (ref 0.40–1.20)
GFR: 100.02 mL/min (ref >60.00)
GLUCOSE: 120 mg/dL — AB (ref 70–99)
POTASSIUM: 3.7 meq/L (ref 3.5–5.1)
Sodium: 135 mEq/L (ref 135–145)
Total Bilirubin: 0.4 mg/dL (ref 0.2–1.2)
Total Protein: 7.6 g/dL (ref 6.0–8.3)

## 2014-12-17 LAB — MICROALBUMIN / CREATININE URINE RATIO
CREATININE, U: 134.5 mg/dL
MICROALB UR: 15 mg/dL — AB (ref 0.0–1.9)
Microalb Creat Ratio: 11.2 mg/g (ref 0.0–30.0)

## 2014-12-17 LAB — HEMOGLOBIN A1C: HEMOGLOBIN A1C: 6.5 % (ref 4.6–6.5)

## 2014-12-17 MED ORDER — AMLODIPINE BESYLATE 10 MG PO TABS: 10.0000 mg | ORAL_TABLET | Freq: Every day | ORAL | Status: DC

## 2014-12-17 MED ORDER — ATORVASTATIN CALCIUM 20 MG PO TABS: 20.0000 mg | ORAL_TABLET | Freq: Every day | ORAL | Status: DC

## 2014-12-17 MED ORDER — METFORMIN HCL 500 MG PO TABS: 500.0000 mg | ORAL_TABLET | Freq: Two times a day (BID) | ORAL | Status: DC

## 2014-12-17 NOTE — Progress Notes (Signed)
Subjective:    Patient ID: Cynthia [name], female    DOB: [birth year], 69 y.o.   MRN: [id number]  HPI  69YO female presents for follow up.  DM - BG have been low 100s. Compliant with metformin.  HTN - Compliant with medication. No chest pain, HA, palpitations  Has follow up CT chest scheduled in 03/2015. Continues to have mild dyspnea on exertion. No change from previous. No CP, palpitations.  Past medical, surgical, family and social history per today's encounter.  Review of Systems  Constitutional: Negative for fever, chills, appetite change, fatigue and unexpected weight change.  Eyes: Negative for visual disturbance.  Respiratory: Positive for shortness of breath (with exertion). Negative for cough and wheezing.   Cardiovascular: Negative for chest pain and leg swelling.  Gastrointestinal: Negative for vomiting, abdominal pain, diarrhea and constipation.  Musculoskeletal: Negative for myalgias and arthralgias.  Skin: Negative for color change and rash.  Hematological: Negative for adenopathy. Does not bruise/bleed easily.  Psychiatric/Behavioral: Negative for dysphoric mood. The patient is not nervous/anxious.        Objective:    BP 129/80 mmHg  Pulse 116  Temp(Src) 98.4 F (36.9 C) (Oral)  Ht 5\' 6"  (1.676 m)  Wt 244 lb (110.678 kg)  BMI 39.40 kg/m2  SpO2 97% Physical Exam  Constitutional: She is oriented to person, place, and time. She appears well-developed and well-nourished. No distress.  HENT:  Head: Normocephalic and atraumatic.  Right Ear: External ear normal.  Left Ear: External ear normal.  Nose: Nose normal.  Mouth/Throat: Oropharynx is clear and moist. No oropharyngeal exudate.  Eyes: Conjunctivae are normal. Pupils are equal, round, and reactive to light. Right eye exhibits no discharge. Left eye exhibits no discharge. No scleral icterus.  Neck: Normal range of motion. Neck supple. No tracheal deviation present. No thyromegaly present.    Cardiovascular: Normal rate, regular rhythm, normal heart sounds and intact distal pulses.  Exam reveals no gallop and no friction rub.   No murmur heard. Pulmonary/Chest: Effort normal and breath sounds normal. No respiratory distress. She has no wheezes. She has no rales. She exhibits no tenderness.  Musculoskeletal: Normal range of motion. She exhibits no edema or tenderness.  Lymphadenopathy:    She has no cervical adenopathy.  Neurological: She is alert and oriented to person, place, and time. No cranial nerve deficit. She exhibits normal muscle tone. Coordination normal.  Skin: Skin is warm and dry. No rash noted. She is not diaphoretic. No erythema. No pallor.  Psychiatric: She has a normal mood and affect. Her behavior is normal. Judgment and thought content normal.          Assessment & Plan:   Problem List Items Addressed This Visit      Unprioritized   Abnormal CT scan, chest    Follow up CT scheduled for July 2016.      Diabetes mellitus, type 2 - Primary    Will check A1c with labs. Continue Metformin.      Relevant Medications   metFORMIN (GLUCOPHAGE) tablet   atorvastatin (LIPITOR) tablet   Other Relevant Orders   Comprehensive metabolic panel   Hemoglobin A1c   Lipid panel   Microalbumin / creatinine urine ratio   Hyperlipidemia    Will check lipids with labs. Continue Atorvastatin.      Relevant Medications   amLODIpine (NORVASC) tablet   atorvastatin (LIPITOR) tablet   Hypertension    BP Readings from Last 3 Encounters:  12/17/14 129/80  10/29/14 143/85  09/15/14 116/77   BP well controlled. Renal function with labs. Continue Losartan-HCTZ and amlodipine.      Relevant Medications   amLODIpine (NORVASC) tablet   atorvastatin (LIPITOR) tablet       Return in about 3 months (around 03/19/2015) for Wellness Visit.

## 2014-12-17 NOTE — Assessment & Plan Note (Signed)
Follow up CT scheduled for July 2016.

## 2014-12-17 NOTE — Patient Instructions (Addendum)
Labs today.   Follow up in 3 months.  

## 2014-12-17 NOTE — Progress Notes (Signed)
Pre visit review using our clinic review tool, if applicable. No additional management support is needed unless otherwise documented below in the visit note. 

## 2014-12-17 NOTE — Assessment & Plan Note (Signed)
Will check A1c with labs. Continue Metformin. 

## 2014-12-17 NOTE — Assessment & Plan Note (Signed)
BP Readings from Last 3 Encounters:  12/17/14 129/80  10/29/14 143/85  09/15/14 116/77   BP well controlled. Renal function with labs. Continue Losartan-HCTZ and amlodipine.

## 2014-12-17 NOTE — Assessment & Plan Note (Signed)
Will check lipids with labs. Continue Atorvastatin. 

## 2014-12-19 ENCOUNTER — Encounter: Payer: Self-pay | Admitting: *Deleted

## 2015-01-02 ENCOUNTER — Other Ambulatory Visit: Payer: Self-pay | Admitting: Cardiothoracic Surgery

## 2015-01-02 DIAGNOSIS — R918 Other nonspecific abnormal finding of lung field: Secondary | ICD-10-CM

## 2015-01-09 NOTE — Op Note (Signed)
PATIENT NAME:  Cynthia [name], Cynthia [name] MR#:  [id number] DATE OF BIRTH:  1945-12-06  DATE OF PROCEDURE:  01/16/2013  PREOPERATIVE DIAGNOSIS:  PET-positive thyroid nodule.   POSTOPERATIVE DIAGNOSIS:  PET- positive thyroid nodule.  SURGEON:  Consuela Mimes, M.D.   ANESTHESIA:  General.   OPERATION PERFORMED:  Total thyroidectomy.   PROCEDURE IN DETAIL:  The patient was placed supine on the operating room table and prepped and draped in the usual sterile fashion. A curvilinear incision was made in the line to the neck 2 fingerbreadths above the suprasternal notch, and this was carried down through the subcutaneous tissue and the platysma muscle with electrocautery. Subplatysmal flaps were created superiorly and inferiorly, and the intermediate fascia of the neck was opened in the midline between the strap muscles, and the strap muscles were dissected off of the thyroid lobes bilaterally, and on each side, the thyroid was rotated medially. Inferior pole and superior pole vessels were ligated and divided with the Harmonic scalpel. There was no middle thyroid vein on either side. On each side, the recurrent laryngeal nerve was identified and spared throughout the procedure. On the left side, both parathyroid glands were identified and spared throughout the procedure. The left superior parathyroid gland turned a little dark but was not cyanotic and thought to be viable, and the left inferior parathyroid gland remained a completely normal, healthy, perfused color. Near the recurrent laryngeal nerve and where it pierced the cricothyroid, the bipolar electrocautery was used in order to avoid energy dispersion and various ligaments were divided, and therefore, the thyroid was removed. On the right side, the inferior parathyroid area was never dissected, but no inferior parathyroid tissue was identified that was removed from the patient. The superior parathyroid gland was initially identified and appeared healthy and  viable, but at the conclusion of the procedure, I could no longer find it. Hemostasis was excellent. A TLS drain was brought through the suprasternal notch and placed on both sides of the neck. The intermediate fascia was closed, closing the strap muscles in the midline with a running 3-0 Monocryl suture. The platysma was closed with a running 4-0 Monocryl suture, and   the skin was reapproximated with a running subcuticular 5-0 Monocryl and suture strips. The patient tolerated the procedure well. There were no complications.     ____________________________ Consuela Mimes, MD wfm:dmm D: 01/16/2013 10:14:22 ET T: 01/16/2013 10:52:07 ET JOB#: [id number]  cc: Consuela Mimes, MD, <Dictator> Consuela Mimes MD ELECTRONICALLY SIGNED 01/20/2013 14:44

## 2015-02-20 ENCOUNTER — Other Ambulatory Visit: Payer: Self-pay

## 2015-02-20 MED ORDER — LANCETS MISC: Status: DC

## 2015-02-20 MED ORDER — GLUCOSE BLOOD VI STRP: ORAL_STRIP | Status: DC

## 2015-03-26 ENCOUNTER — Inpatient Hospital Stay: Payer: Medicare Other | Attending: Cardiothoracic Surgery | Admitting: Cardiothoracic Surgery

## 2015-03-26 ENCOUNTER — Encounter: Payer: Self-pay | Admitting: Cardiothoracic Surgery

## 2015-03-26 ENCOUNTER — Ambulatory Visit
Admission: RE | Admit: 2015-03-26 | Discharge: 2015-03-26 | Disposition: A | Discharge status: Home / Self Care | Payer: Medicare Other | Source: Ambulatory Visit | Attending: Cardiothoracic Surgery | Admitting: Cardiothoracic Surgery

## 2015-03-26 VITALS — BP 135/76 | HR 105 | Temp 98.0°F | Resp 18 | Ht 66.0 in | Wt 239.0 lb

## 2015-03-26 DIAGNOSIS — I2510 Atherosclerotic heart disease of native coronary artery without angina pectoris: Secondary | ICD-10-CM | POA: Diagnosis not present

## 2015-03-26 DIAGNOSIS — R938 Abnormal findings on diagnostic imaging of other specified body structures: Secondary | ICD-10-CM | POA: Diagnosis not present

## 2015-03-26 DIAGNOSIS — R918 Other nonspecific abnormal finding of lung field: Secondary | ICD-10-CM | POA: Diagnosis not present

## 2015-03-26 DIAGNOSIS — J984 Other disorders of lung: Secondary | ICD-10-CM | POA: Diagnosis present

## 2015-03-26 DIAGNOSIS — Z87891 Personal history of nicotine dependence: Secondary | ICD-10-CM | POA: Diagnosis not present

## 2015-03-26 DIAGNOSIS — E063 Autoimmune thyroiditis: Secondary | ICD-10-CM | POA: Insufficient documentation

## 2015-03-26 DIAGNOSIS — K760 Fatty (change of) liver, not elsewhere classified: Secondary | ICD-10-CM | POA: Diagnosis not present

## 2015-03-26 DIAGNOSIS — F17200 Nicotine dependence, unspecified, uncomplicated: Secondary | ICD-10-CM | POA: Diagnosis not present

## 2015-03-26 DIAGNOSIS — R9389 Abnormal findings on diagnostic imaging of other specified body structures: Secondary | ICD-10-CM

## 2015-03-26 IMAGING — CT CT CHEST W/O CM
1 of 2 series · 14 of 32 positions shown, 18 images · non-contrast
Comparison: Chest CT [DATE], [DATE].  PET-CT [DATE]

CLINICAL DATA: Follow up pulmonary nodules. Former smoker. History
of Hashimoto's thyroiditis. No history of malignancy. Subsequent
encounter.

EXAM:
CT CHEST WITHOUT CONTRAST
TECHNIQUE: Multidetector CT imaging of the chest was performed following the
standard protocol without IV contrast.

[Series 2: routine chest wo · axial · 0.72mm/px · z∈[+130,+394]mm · 14 of 63 slices shown, 18 images]
[im 5/63  mediastinal]
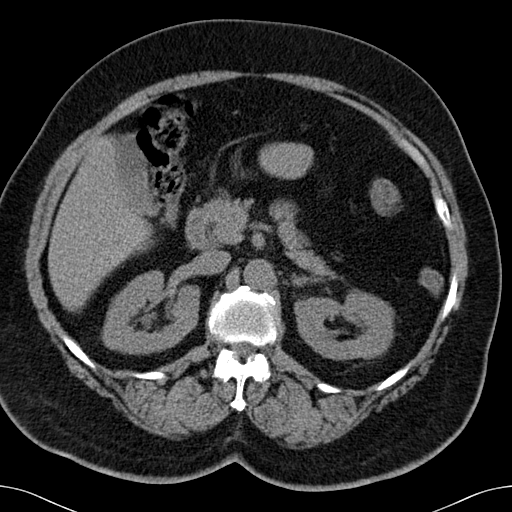
[im 5/63  lung]
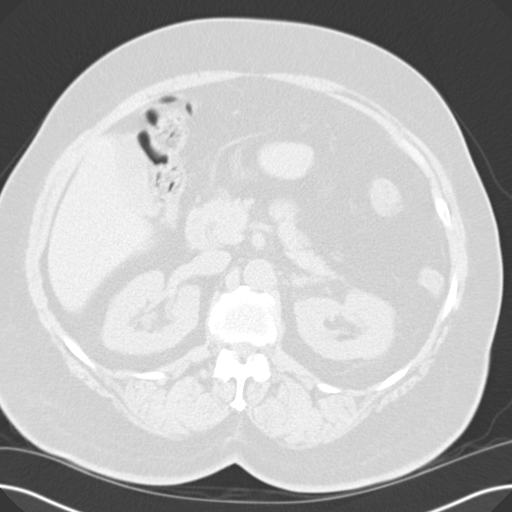
[im 10/63  lung]
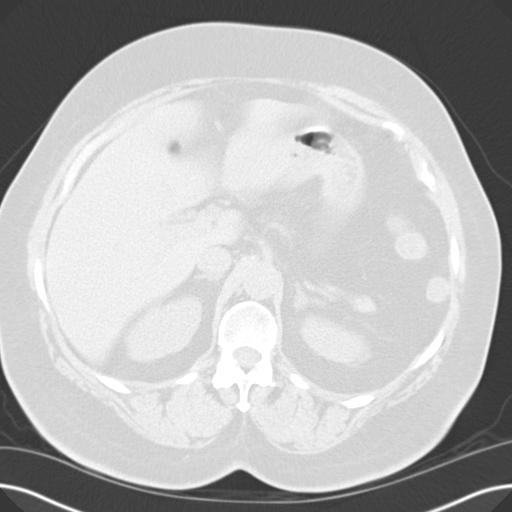
[im 15/63  lung]
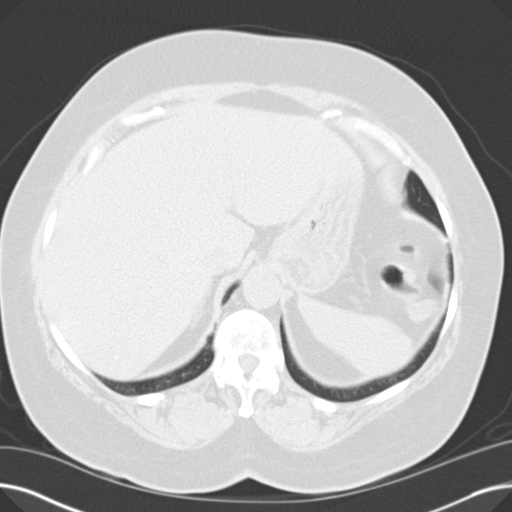
[im 20/63  lung]
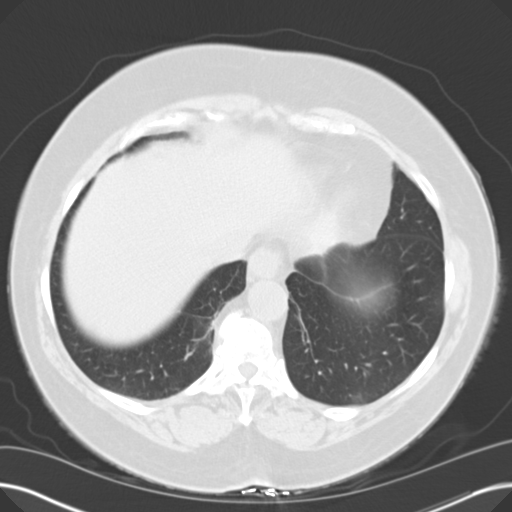
[im 24/63  mediastinal]
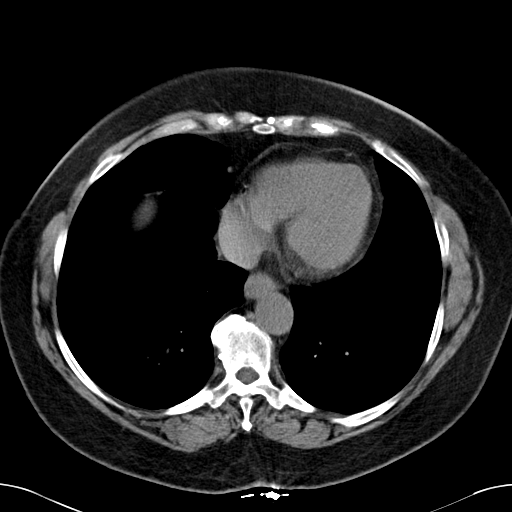
[im 24/63  lung]
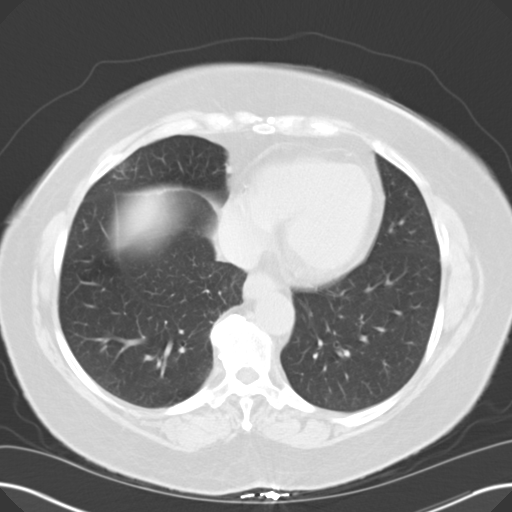
[im 29/63  lung]
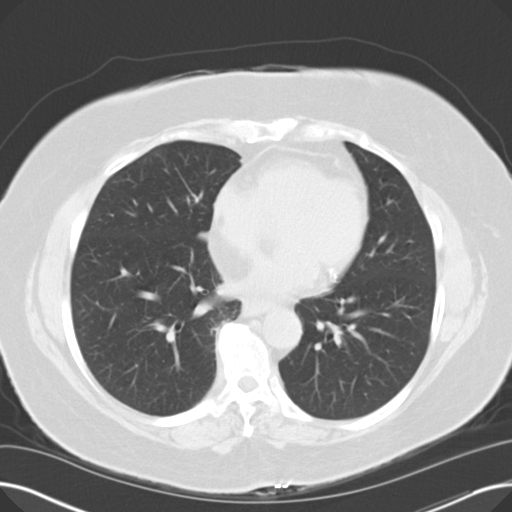
[im 30/63  lung]
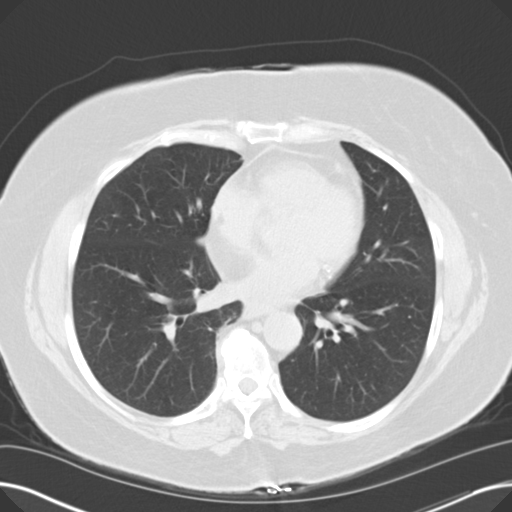
[im 32/63  lung]
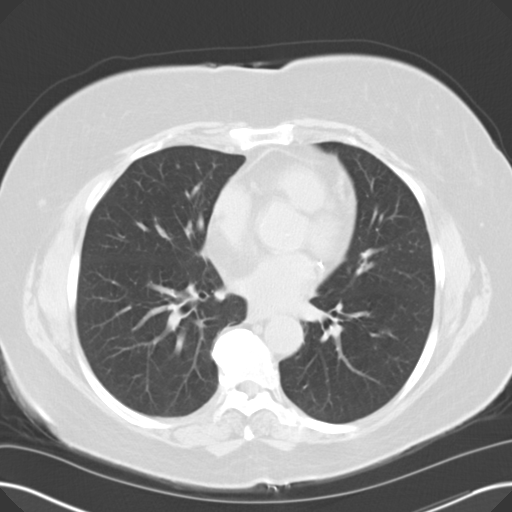
[im 34/63  mediastinal]
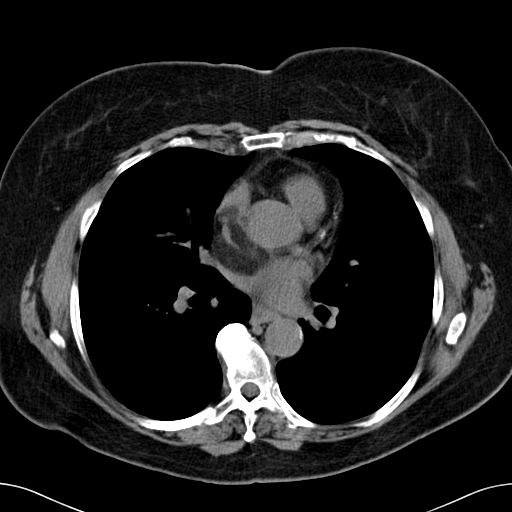
[im 34/63  lung]
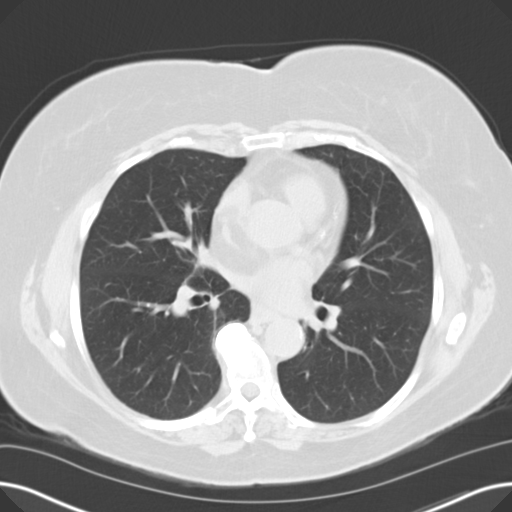
[im 39/63  lung]
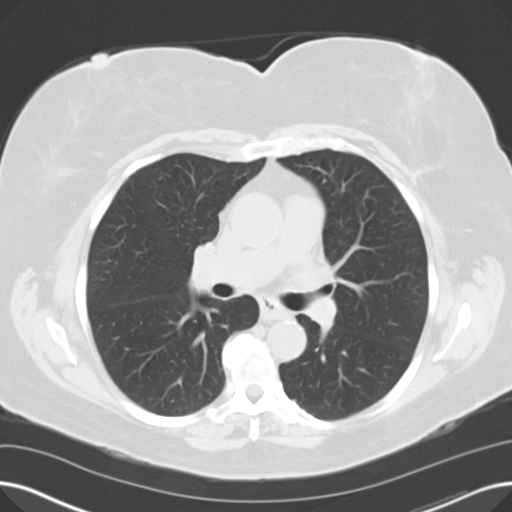
[im 43/63  lung]
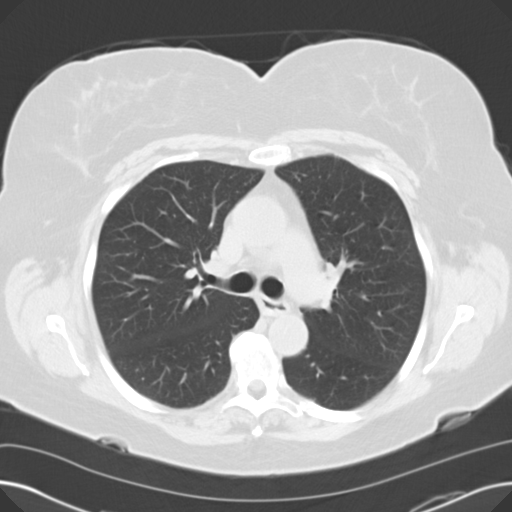
[im 48/63  lung]
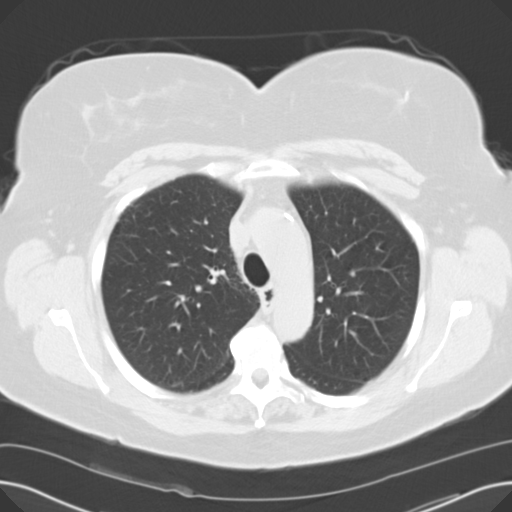
[im 53/63  mediastinal]
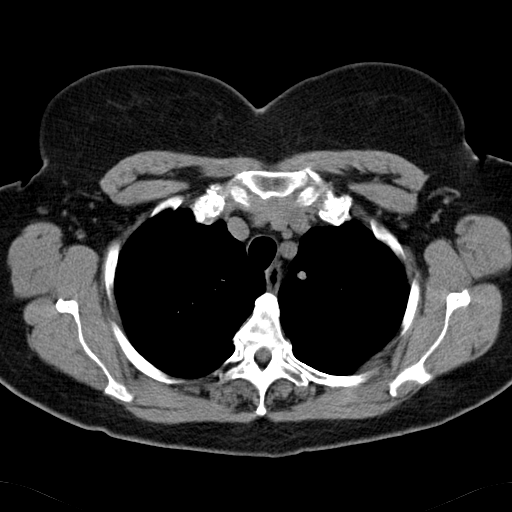
[im 53/63  lung]
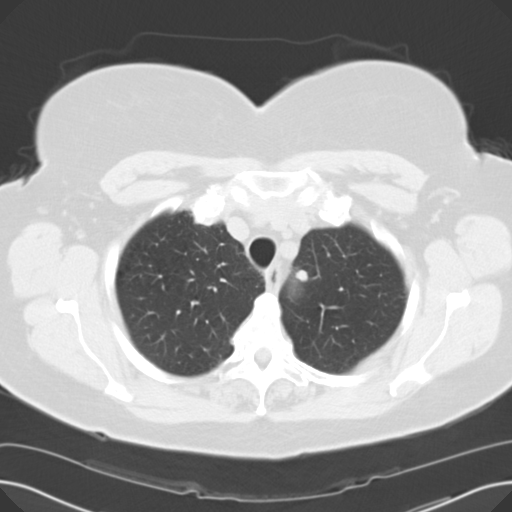
[im 58/63  lung]
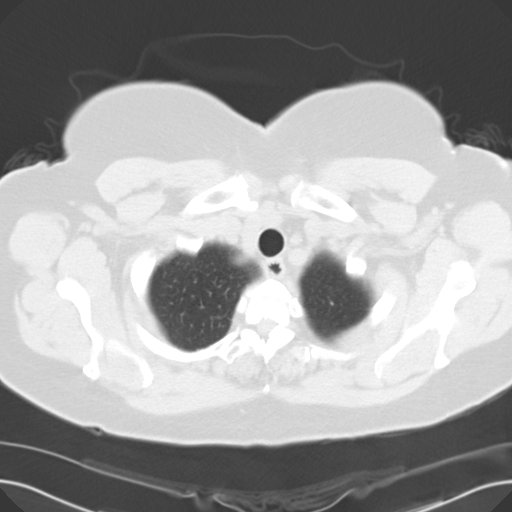

[14 of 32 positions shown; findings below may reference images not displayed]

FINDINGS: Mediastinum/Nodes: There are no enlarged mediastinal, hilar or
axillary lymph nodes. Previous thyroidectomy. The heart size is
stable. Lipomatosis hypertrophy of the interatrial septum again
noted. There is stable mild atherosclerosis of the aorta and
coronary arteries. There is a stable small hiatal hernia.

Lungs/Pleura: There is no pleural effusion. 9 mm noncalcified left
upper lobe nodule superior to the aortic arch on image number 11 is
unchanged. The other tiny nodule at the left apex on image 6 is also
stable. No new or enlarging pulmonary nodules. Mild emphysema.

Upper abdomen:  Stable hepatic steatosis.  No adrenal mass.

Musculoskeletal/Chest wall: There is no chest wall mass or
suspicious osseous finding. Stable mild degenerative changes in the
spine and at the sternomanubrial junction.
IMPRESSION: 1. No significant change in left upper lobe pulmonary nodules. These
are stable from at least [DATE], consistent with a benign
etiology.
2. No acute findings.
3. Stable atherosclerosis and hepatic steatosis.

## 2015-03-26 NOTE — Progress Notes (Signed)
Cynthia [name] Inpatient Post-Op Note  Patient ID: Cynthia [name], female   DOB: 1946/09/12, 69 y.o.   MRN: [id number]  HISTORY: She returns again in follow-up. She has no new complaints. She states she has lost a little bit of weight but this is been intentional. She does not complain of any shortness of breath.   Filed Vitals:   03/26/15 1211  BP: 135/76  Pulse: 105  Temp: 98 F (36.7 C)  Resp: 18     EXAM: Resp: Lungs are clear bilaterally.  No respiratory distress, normal effort. Heart:  Regular without murmurs Abd:  Abdomen is soft, non distended and non tender. No masses are palpable.  There is no rebound and no guarding.  Neurological: Alert and oriented to person, place, and time. Coordination normal.  Skin: Skin is warm and dry. No rash noted. No diaphoretic. No erythema. No pallor.  Psychiatric: Normal mood and affect. Normal behavior. Judgment and thought content normal.    ASSESSMENT: I have independently reviewed the most recent CT scan which was performed today. This demonstrates a left upper lobe noncalcified lesion which is grossly unchanged from 6 months ago. The official reading on the CT scan is not yet available but I will review this with the patient tomorrow.   PLAN:   We'll set the patient up for a repeat CT scan in 6 months. I also discussed with the possibility of just removing this. She is not interested in doing so. Therefore we'll continue our follow-up. This will be based course on the official radiology report which I will have tomorrow.    Nestor Lewandowsky, MD

## 2015-04-02 ENCOUNTER — Other Ambulatory Visit: Payer: Self-pay

## 2015-04-02 ENCOUNTER — Ambulatory Visit (INDEPENDENT_AMBULATORY_CARE_PROVIDER_SITE_OTHER): Payer: Medicare Other

## 2015-04-02 ENCOUNTER — Telehealth: Payer: Self-pay

## 2015-04-02 VITALS — BP 138/74 | HR 97 | Temp 98.4°F | Resp 14 | Ht 66.0 in | Wt 240.0 lb

## 2015-04-02 DIAGNOSIS — Z0000 Encounter for general adult medical examination without abnormal findings: Secondary | ICD-10-CM | POA: Diagnosis not present

## 2015-04-02 DIAGNOSIS — E785 Hyperlipidemia, unspecified: Secondary | ICD-10-CM

## 2015-04-02 DIAGNOSIS — I10 Essential (primary) hypertension: Secondary | ICD-10-CM

## 2015-04-02 DIAGNOSIS — E119 Type 2 diabetes mellitus without complications: Secondary | ICD-10-CM

## 2015-04-02 LAB — HM DIABETES FOOT EXAM: HM Diabetic Foot Exam: NORMAL

## 2015-04-02 MED ORDER — METFORMIN HCL 500 MG PO TABS: 500.0000 mg | ORAL_TABLET | Freq: Two times a day (BID) | ORAL | Status: DC

## 2015-04-02 MED ORDER — AMLODIPINE BESYLATE 10 MG PO TABS: 10.0000 mg | ORAL_TABLET | Freq: Every day | ORAL | Status: DC

## 2015-04-02 MED ORDER — MELOXICAM 15 MG PO TABS: 15.0000 mg | ORAL_TABLET | Freq: Every day | ORAL | Status: DC

## 2015-04-02 MED ORDER — ATORVASTATIN CALCIUM 20 MG PO TABS: 20.0000 mg | ORAL_TABLET | Freq: Every day | ORAL | Status: DC

## 2015-04-02 NOTE — Progress Notes (Signed)
Annual Wellness Visit as completed by [facility] Coach was reviewed in full.  

## 2015-04-02 NOTE — Patient Instructions (Addendum)
Cynthia [name],  Thank you for taking time to come for your Medicare Wellness Visit.  I appreciate your ongoing commitment to your health goals. Please review the following plan we discussed and let me know if I can assist you in the future.  Goals:  Increase physical activity by adding a lap when walking around the track with sister.  Currently walking 2 miles per day, increased to walk 3 miles per day at 30 minutes.  Make eye exam with [facility] as soon as possible.  Talk to daughter about HCPOA/Living Will. Call back and make an Advance Directive appointment with the Health Coach.  3 month follow up with Dr. Gilford Rile made on 07/03/15 at [place].  Please call within 24 hours to cancel/reschedule this appointment.  A1[facility] lab appointment made on 06/19/15 at 9am. Do not eat after midnight, FASTING LABS.  Please call within 24 hours to cancel/reschedule appointment.    Bone Densitometry Bone densitometry is a special X-ray that measures your bone density and can be used to help predict your risk of bone fractures. This test is used to determine bone mineral content and density to diagnose osteoporosis. Osteoporosis is the loss of bone that may cause the bone to become weak. Osteoporosis commonly occurs in women entering menopause. However, it may be found in men and in people with other diseases. PREPARATION FOR TEST No preparation necessary. WHO SHOULD BE TESTED?  All women older than 65.  Postmenopausal women (50 to 57) with risk factors for osteoporosis.  People with a previous fracture caused by normal activities.  People with a small body frame (less than 127 poundsor a body mass index [BMI] of less than 21).  People who have a parent with a hip fracture or history of osteoporosis.  People who smoke.  People who have rheumatoid arthritis.  Anyone who engages in excessive alcohol use (more than 3 drinks most days).  Women who experience early menopause. WHEN SHOULD YOU BE  RETESTED? Current guidelines suggest that you should wait at least 2 years before doing a bone density test again if your first test was normal.Recent studies indicated that women with normal bone density may be able to wait a few years before needing to repeat a bone density test. You should discuss this with your caregiver.  NORMAL FINDINGS   Normal: less than standard deviation below normal (greater than -1).  Osteopenia: 1 to 2.5 standard deviations below normal (-1 to -2.5).  Osteoporosis: greater than 2.5 standard deviations below normal (less than -2.5). Test results are reported as a "T score" and a "Z score."The T score is a number that compares your bone density with the bone density of healthy, young women.The Z score is a number that compares your bone density with the scores of women who are the same age, gender, and race.  Ranges for normal findings may vary among different laboratories and hospitals. You should always check with your doctor after having lab work or other tests done to discuss the meaning of your test results and whether your values are considered within normal limits. MEANING OF TEST  Your caregiver will go over the test results with you and discuss the importance and meaning of your results, as well as treatment options and the need for additional tests if necessary. OBTAINING THE TEST RESULTS It is your responsibility to obtain your test results. Ask the lab or department performing the test when and how you will get your results. Document Released: 09/27/2004 Document  Revised: 11/28/2011 Document Reviewed: 10/20/2010 [employer] Patient Information 2015 [place], [place]. This information is not intended to replace advice given to you by your health care provider. Make sure you discuss any questions you have with your health care provider.  Fat and Cholesterol Control Diet Fat and cholesterol levels in your blood and organs are influenced by your diet. High levels  of fat and cholesterol may lead to diseases of the heart, small and large blood vessels, gallbladder, liver, and pancreas. CONTROLLING FAT AND CHOLESTEROL WITH DIET Although exercise and lifestyle factors are important, your diet is key. That is because certain foods are known to raise cholesterol and others to lower it. The goal is to balance foods for their effect on cholesterol and more importantly, to replace saturated and trans fat with other types of fat, such as monounsaturated fat, polyunsaturated fat, and omega-3 fatty acids. On average, a person should consume no more than 15 to 17 g of saturated fat daily. Saturated and trans fats are considered "bad" fats, and they will raise LDL cholesterol. Saturated fats are primarily found in animal products such as meats, butter, and cream. However, that does not mean you need to give up all your favorite foods. Today, there are good tasting, low-fat, low-cholesterol substitutes for most of the things you like to eat. Choose low-fat or nonfat alternatives. Choose round or loin cuts of red meat. These types of cuts are lowest in fat and cholesterol. Chicken (without the skin), fish, veal, and ground [place] breast are great choices. Eliminate fatty meats, such as hot dogs and salami. Even shellfish have little or no saturated fat. Have a 3 oz (85 g) portion when you eat lean meat, poultry, or fish. Trans fats are also called "partially hydrogenated oils." They are oils that have been scientifically manipulated so that they are solid at room temperature resulting in a longer shelf life and improved taste and texture of foods in which they are added. Trans fats are found in stick margarine, some tub margarines, cookies, crackers, and baked goods.  When baking and cooking, oils are a great substitute for butter. The monounsaturated oils are especially beneficial since it is believed they lower LDL and raise HDL. The oils you should avoid entirely are saturated  tropical oils, such as coconut and palm.  Remember to eat a lot from food groups that are naturally free of saturated and trans fat, including fish, fruit, vegetables, beans, grains (barley, rice, couscous, bulgur wheat), and pasta (without cream sauces).  IDENTIFYING FOODS THAT LOWER FAT AND CHOLESTEROL  Soluble fiber may lower your cholesterol. This type of fiber is found in fruits such as apples, vegetables such as broccoli, potatoes, and carrots, legumes such as beans, peas, and lentils, and grains such as barley. Foods fortified with plant sterols (phytosterol) may also lower cholesterol. You should eat at least 2 g per day of these foods for a cholesterol lowering effect.  Read package labels to identify low-saturated fats, trans fat free, and low-fat foods at the supermarket. Select cheeses that have only 2 to 3 g saturated fat per ounce. Use a heart-healthy tub margarine that is free of trans fats or partially hydrogenated oil. When buying baked goods (cookies, crackers), avoid partially hydrogenated oils. Breads and muffins should be made from whole grains (whole-wheat or whole oat flour, instead of "flour" or "enriched flour"). Buy non-creamy canned soups with reduced salt and no added fats.  FOOD PREPARATION TECHNIQUES  Never deep-fry. If you must fry, either stir-fry,  which uses very little fat, or use non-stick cooking sprays. When possible, broil, bake, or roast meats, and steam vegetables. Instead of putting butter or margarine on vegetables, use lemon and herbs, applesauce, and cinnamon (for squash and sweet potatoes). Use nonfat yogurt, salsa, and low-fat dressings for salads.  LOW-SATURATED FAT / LOW-FAT FOOD SUBSTITUTES Meats / Saturated Fat (g)  Avoid: Steak, marbled (3 oz/85 g) / 11 g  Choose: Steak, lean (3 oz/85 g) / 4 g  Avoid: Hamburger (3 oz/85 g) / 7 g  Choose: Hamburger, lean (3 oz/85 g) / 5 g  Avoid: Ham (3 oz/85 g) / 6 g  Choose: Ham, lean cut (3 oz/85 g) / 2.4  g  Avoid: Chicken, with skin, dark meat (3 oz/85 g) / 4 g  Choose: Chicken, skin removed, dark meat (3 oz/85 g) / 2 g  Avoid: Chicken, with skin, light meat (3 oz/85 g) / 2.5 g  Choose: Chicken, skin removed, light meat (3 oz/85 g) / 1 g Dairy / Saturated Fat (g)  Avoid: Whole milk (1 cup) / 5 g  Choose: Low-fat milk, 2% (1 cup) / 3 g  Choose: Low-fat milk, 1% (1 cup) / 1.5 g  Choose: Skim milk (1 cup) / 0.3 g  Avoid: Hard cheese (1 oz/28 g) / 6 g  Choose: Skim milk cheese (1 oz/28 g) / 2 to 3 g  Avoid: Cottage cheese, 4% fat (1 cup) / 6.5 g  Choose: Low-fat cottage cheese, 1% fat (1 cup) / 1.5 g  Avoid: Ice cream (1 cup) / 9 g  Choose: Sherbet (1 cup) / 2.5 g  Choose: Nonfat frozen yogurt (1 cup) / 0.3 g  Choose: Frozen fruit bar / trace  Avoid: Whipped cream (1 tbs) / 3.5 g  Choose: Nondairy whipped topping (1 tbs) / 1 g Condiments / Saturated Fat (g)  Avoid: Mayonnaise (1 tbs) / 2 g  Choose: Low-fat mayonnaise (1 tbs) / 1 g  Avoid: Butter (1 tbs) / 7 g  Choose: Extra light margarine (1 tbs) / 1 g  Avoid: Coconut oil (1 tbs) / 11.8 g  Choose: Olive oil (1 tbs) / 1.8 g  Choose: Corn oil (1 tbs) / 1.7 g  Choose: Safflower oil (1 tbs) / 1.2 g  Choose: Sunflower oil (1 tbs) / 1.4 g  Choose: Soybean oil (1 tbs) / 2.4 g  Choose: Canola oil (1 tbs) / 1 g Document Released: 09/05/2005 Document Revised: 12/31/2012 Document Reviewed: 12/04/2013 Exit[employer] Patient Information 2015 [place], [place]. This information is not intended to replace advice given to you by your health care provider. Make sure you discuss any questions you have with your health care provider.  Diabetes and Foot Care Diabetes may cause you to have problems because of poor blood supply (circulation) to your feet and legs. This may cause the skin on your feet to become thinner, break easier, and heal more slowly. Your skin may become dry, and the skin may peel and crack. You may also have  nerve damage in your legs and feet causing decreased feeling in them. You may not notice minor injuries to your feet that could lead to infections or more serious problems. Taking care of your feet is one of the most important things you can do for yourself.  HOME CARE INSTRUCTIONS  Wear shoes at all times, even in the house. Do not go barefoot. Bare feet are easily injured.  Check your feet daily for blisters, cuts, and redness. If  you cannot see the bottom of your feet, use a mirror or ask someone for help.  Wash your feet with warm water (do not use hot water) and mild soap. Then pat your feet and the areas between your toes until they are completely dry. Do not soak your feet as this can dry your skin.  Apply a moisturizing lotion or petroleum jelly (that does not contain alcohol and is unscented) to the skin on your feet and to dry, brittle toenails. Do not apply lotion between your toes.  Trim your toenails straight across. Do not dig under them or around the cuticle. File the edges of your nails with an emery board or nail file.  Do not cut corns or calluses or try to remove them with medicine.  Wear clean socks or stockings every day. Make sure they are not too tight. Do not wear knee-high stockings since they may decrease blood flow to your legs.  Wear shoes that fit properly and have enough cushioning. To break in new shoes, wear them for just a few hours a day. This prevents you from injuring your feet. Always look in your shoes before you put them on to be sure there are no objects inside.  Do not cross your legs. This may decrease the blood flow to your feet.  If you find a minor scrape, cut, or break in the skin on your feet, keep it and the skin around it clean and dry. These areas may be cleansed with mild soap and water. Do not cleanse the area with peroxide, alcohol, or iodine.  When you remove an adhesive bandage, be sure not to damage the skin around it.  If you have a  wound, look at it several times a day to make sure it is healing.  Do not use heating pads or hot water bottles. They may burn your skin. If you have lost feeling in your feet or legs, you may not know it is happening until it is too late.  Make sure your health care provider performs a complete foot exam at least annually or more often if you have foot problems. Report any cuts, sores, or bruises to your health care provider immediately. SEEK MEDICAL CARE IF:   You have an injury that is not healing.  You have cuts or breaks in the skin.  You have an ingrown nail.  You notice redness on your legs or feet.  You feel burning or tingling in your legs or feet.  You have pain or cramps in your legs and feet.  Your legs or feet are numb.  Your feet always feel cold. SEEK IMMEDIATE MEDICAL CARE IF:   There is increasing redness, swelling, or pain in or around a wound.  There is a red line that goes up your leg.  Pus is coming from a wound.  You develop a fever or as directed by your health care provider.  You notice a bad smell coming from an ulcer or wound. Document Released: 09/02/2000 Document Revised: 05/08/2013 Document Reviewed: 02/12/2013 [facility] Patient Information 2015 [place], [place]. This information is not intended to replace advice given to you by your health care provider. Make sure you discuss any questions you have with your health care provider.  Glaucoma Glaucoma happens when the fluid pressure in the eyeball is too high. The pressure cannot stay high for too long, or the eyeball may become damaged. Signs of glaucoma include:  Having a hard time seeing in a dark  room after being in a bright one.  Having trouble seeing things out to the sides of you.  Blurry sight.  Seeing bright white lights or colors in front of your eyes.  Headaches.  Feeling sick to your stomach (nauseous) or throwing up (vomiting).  Sudden vision loss. Glaucoma testing is an  important part of taking care of your eyesight. HOME CARE  Always use your eyedrops or pills as told by your doctor. Do not run out.  Do not go away from home without your eyedrops or pills.  Keep your appointments.  Always tell a new doctor that you have glaucoma and how long you have had it. Tell the doctor about the eyedrops and pills you take.  Do not use eyedrops or pills that have not been prescribed by your doctor. GET HELP RIGHT AWAY IF:  You develop severe pain in the affected eye.  You develop vision problems.  You develop a bad headache in the area around the eye.  You feel sick to your stomach or throw up.  You start to have problems with the other eye. MAKE SURE YOU:   Understand these instructions.  Will watch your condition.  Will get help right away if you are not doing well or get worse. Document Released: 06/14/2008 Document Revised: 11/28/2011 Document Reviewed: 06/14/2008 [facility] Patient Information 2015 [place], [place]. This information is not intended to replace advice given to you by your health care provider. Make sure you discuss any questions you have with your health care provider.  Bone Densitometry Bone densitometry is a special X-ray that measures your bone density and can be used to help predict your risk of bone fractures. This test is used to determine bone mineral content and density to diagnose osteoporosis. Osteoporosis is the loss of bone that may cause the bone to become weak. Osteoporosis commonly occurs in women entering menopause. However, it may be found in men and in people with other diseases. PREPARATION FOR TEST No preparation necessary. WHO SHOULD BE TESTED?  All women older than 52.  Postmenopausal women (50 to 58) with risk factors for osteoporosis.  People with a previous fracture caused by normal activities.  People with a small body frame (less than 127 poundsor a body mass index [BMI] of less than 21).  People who  have a parent with a hip fracture or history of osteoporosis.  People who smoke.  People who have rheumatoid arthritis.  Anyone who engages in excessive alcohol use (more than 3 drinks most days).  Women who experience early menopause. WHEN SHOULD YOU BE RETESTED? Current guidelines suggest that you should wait at least 2 years before doing a bone density test again if your first test was normal.Recent studies indicated that women with normal bone density may be able to wait a few years before needing to repeat a bone density test. You should discuss this with your caregiver.  NORMAL FINDINGS   Normal: less than standard deviation below normal (greater than -1).  Osteopenia: 1 to 2.5 standard deviations below normal (-1 to -2.5).  Osteoporosis: greater than 2.5 standard deviations below normal (less than -2.5). Test results are reported as a "T score" and a "Z score."The T score is a number that compares your bone density with the bone density of healthy, young women.The Z score is a number that compares your bone density with the scores of women who are the same age, gender, and race.  Ranges for normal findings may vary among different laboratories  and hospitals. You should always check with your doctor after having lab work or other tests done to discuss the meaning of your test results and whether your values are considered within normal limits. MEANING OF TEST  Your caregiver will go over the test results with you and discuss the importance and meaning of your results, as well as treatment options and the need for additional tests if necessary. OBTAINING THE TEST RESULTS It is your responsibility to obtain your test results. Ask the lab or department performing the test when and how you will get your results. Document Released: 09/27/2004 Document Revised: 11/28/2011 Document Reviewed: 10/20/2010 [facility] Patient Information 2015 [place], [place]. This information is not intended to  replace advice given to you by your health care provider. Make sure you discuss any questions you have with your health care provider.  Fat and Cholesterol Control Diet Fat and cholesterol levels in your blood and organs are influenced by your diet. High levels of fat and cholesterol may lead to diseases of the heart, small and large blood vessels, gallbladder, liver, and pancreas. CONTROLLING FAT AND CHOLESTEROL WITH DIET Although exercise and lifestyle factors are important, your diet is key. That is because certain foods are known to raise cholesterol and others to lower it. The goal is to balance foods for their effect on cholesterol and more importantly, to replace saturated and trans fat with other types of fat, such as monounsaturated fat, polyunsaturated fat, and omega-3 fatty acids. On average, a person should consume no more than 15 to 17 g of saturated fat daily. Saturated and trans fats are considered "bad" fats, and they will raise LDL cholesterol. Saturated fats are primarily found in animal products such as meats, butter, and cream. However, that does not mean you need to give up all your favorite foods. Today, there are good tasting, low-fat, low-cholesterol substitutes for most of the things you like to eat. Choose low-fat or nonfat alternatives. Choose round or loin cuts of red meat. These types of cuts are lowest in fat and cholesterol. Chicken (without the skin), fish, veal, and ground [place] breast are great choices. Eliminate fatty meats, such as hot dogs and salami. Even shellfish have little or no saturated fat. Have a 3 oz (85 g) portion when you eat lean meat, poultry, or fish. Trans fats are also called "partially hydrogenated oils." They are oils that have been scientifically manipulated so that they are solid at room temperature resulting in a longer shelf life and improved taste and texture of foods in which they are added. Trans fats are found in stick margarine, some tub  margarines, cookies, crackers, and baked goods.  When baking and cooking, oils are a great substitute for butter. The monounsaturated oils are especially beneficial since it is believed they lower LDL and raise HDL. The oils you should avoid entirely are saturated tropical oils, such as coconut and palm.  Remember to eat a lot from food groups that are naturally free of saturated and trans fat, including fish, fruit, vegetables, beans, grains (barley, rice, couscous, bulgur wheat), and pasta (without cream sauces).  IDENTIFYING FOODS THAT LOWER FAT AND CHOLESTEROL  Soluble fiber may lower your cholesterol. This type of fiber is found in fruits such as apples, vegetables such as broccoli, potatoes, and carrots, legumes such as beans, peas, and lentils, and grains such as barley. Foods fortified with plant sterols (phytosterol) may also lower cholesterol. You should eat at least 2 g per day of these foods for  a cholesterol lowering effect.  Read package labels to identify low-saturated fats, trans fat free, and low-fat foods at the supermarket. Select cheeses that have only 2 to 3 g saturated fat per ounce. Use a heart-healthy tub margarine that is free of trans fats or partially hydrogenated oil. When buying baked goods (cookies, crackers), avoid partially hydrogenated oils. Breads and muffins should be made from whole grains (whole-wheat or whole oat flour, instead of "flour" or "enriched flour"). Buy non-creamy canned soups with reduced salt and no added fats.  FOOD PREPARATION TECHNIQUES  Never deep-fry. If you must fry, either stir-fry, which uses very little fat, or use non-stick cooking sprays. When possible, broil, bake, or roast meats, and steam vegetables. Instead of putting butter or margarine on vegetables, use lemon and herbs, applesauce, and cinnamon (for squash and sweet potatoes). Use nonfat yogurt, salsa, and low-fat dressings for salads.  LOW-SATURATED FAT / LOW-FAT FOOD SUBSTITUTES Meats /  Saturated Fat (g)  Avoid: Steak, marbled (3 oz/85 g) / 11 g  Choose: Steak, lean (3 oz/85 g) / 4 g  Avoid: Hamburger (3 oz/85 g) / 7 g  Choose: Hamburger, lean (3 oz/85 g) / 5 g  Avoid: Ham (3 oz/85 g) / 6 g  Choose: Ham, lean cut (3 oz/85 g) / 2.4 g  Avoid: Chicken, with skin, dark meat (3 oz/85 g) / 4 g  Choose: Chicken, skin removed, dark meat (3 oz/85 g) / 2 g  Avoid: Chicken, with skin, light meat (3 oz/85 g) / 2.5 g  Choose: Chicken, skin removed, light meat (3 oz/85 g) / 1 g Dairy / Saturated Fat (g)  Avoid: Whole milk (1 cup) / 5 g  Choose: Low-fat milk, 2% (1 cup) / 3 g  Choose: Low-fat milk, 1% (1 cup) / 1.5 g  Choose: Skim milk (1 cup) / 0.3 g  Avoid: Hard cheese (1 oz/28 g) / 6 g  Choose: Skim milk cheese (1 oz/28 g) / 2 to 3 g  Avoid: Cottage cheese, 4% fat (1 cup) / 6.5 g  Choose: Low-fat cottage cheese, 1% fat (1 cup) / 1.5 g  Avoid: Ice cream (1 cup) / 9 g  Choose: Sherbet (1 cup) / 2.5 g  Choose: Nonfat frozen yogurt (1 cup) / 0.3 g  Choose: Frozen fruit bar / trace  Avoid: Whipped cream (1 tbs) / 3.5 g  Choose: Nondairy whipped topping (1 tbs) / 1 g Condiments / Saturated Fat (g)  Avoid: Mayonnaise (1 tbs) / 2 g  Choose: Low-fat mayonnaise (1 tbs) / 1 g  Avoid: Butter (1 tbs) / 7 g  Choose: Extra light margarine (1 tbs) / 1 g  Avoid: Coconut oil (1 tbs) / 11.8 g  Choose: Olive oil (1 tbs) / 1.8 g  Choose: Corn oil (1 tbs) / 1.7 g  Choose: Safflower oil (1 tbs) / 1.2 g  Choose: Sunflower oil (1 tbs) / 1.4 g  Choose: Soybean oil (1 tbs) / 2.4 g  Choose: Canola oil (1 tbs) / 1 g Document Released: 09/05/2005 Document Revised: 12/31/2012 Document Reviewed: 12/04/2013 [employer] Patient Information 2015 [place], [place]. This information is not intended to replace advice given to you by your health care provider. Make sure you discuss any questions you have with your health care provider.  Diabetes and Foot Care Diabetes may cause  you to have problems because of poor blood supply (circulation) to your feet and legs. This may cause the skin on your feet to become thinner,  break easier, and heal more slowly. Your skin may become dry, and the skin may peel and crack. You may also have nerve damage in your legs and feet causing decreased feeling in them. You may not notice minor injuries to your feet that could lead to infections or more serious problems. Taking care of your feet is one of the most important things you can do for yourself.  HOME CARE INSTRUCTIONS  Wear shoes at all times, even in the house. Do not go barefoot. Bare feet are easily injured.  Check your feet daily for blisters, cuts, and redness. If you cannot see the bottom of your feet, use a mirror or ask someone for help.  Wash your feet with warm water (do not use hot water) and mild soap. Then pat your feet and the areas between your toes until they are completely dry. Do not soak your feet as this can dry your skin.  Apply a moisturizing lotion or petroleum jelly (that does not contain alcohol and is unscented) to the skin on your feet and to dry, brittle toenails. Do not apply lotion between your toes.  Trim your toenails straight across. Do not dig under them or around the cuticle. File the edges of your nails with an emery board or nail file.  Do not cut corns or calluses or try to remove them with medicine.  Wear clean socks or stockings every day. Make sure they are not too tight. Do not wear knee-high stockings since they may decrease blood flow to your legs.  Wear shoes that fit properly and have enough cushioning. To break in new shoes, wear them for just a few hours a day. This prevents you from injuring your feet. Always look in your shoes before you put them on to be sure there are no objects inside.  Do not cross your legs. This may decrease the blood flow to your feet.  If you find a minor scrape, cut, or break in the skin on your feet, keep  it and the skin around it clean and dry. These areas may be cleansed with mild soap and water. Do not cleanse the area with peroxide, alcohol, or iodine.  When you remove an adhesive bandage, be sure not to damage the skin around it.  If you have a wound, look at it several times a day to make sure it is healing.  Do not use heating pads or hot water bottles. They may burn your skin. If you have lost feeling in your feet or legs, you may not know it is happening until it is too late.  Make sure your health care provider performs a complete foot exam at least annually or more often if you have foot problems. Report any cuts, sores, or bruises to your health care provider immediately. SEEK MEDICAL CARE IF:   You have an injury that is not healing.  You have cuts or breaks in the skin.  You have an ingrown nail.  You notice redness on your legs or feet.  You feel burning or tingling in your legs or feet.  You have pain or cramps in your legs and feet.  Your legs or feet are numb.  Your feet always feel cold. SEEK IMMEDIATE MEDICAL CARE IF:   There is increasing redness, swelling, or pain in or around a wound.  There is a red line that goes up your leg.  Pus is coming from a wound.  You develop a fever or as  directed by your health care provider.  You notice a bad smell coming from an ulcer or wound. Document Released: 09/02/2000 Document Revised: 05/08/2013 Document Reviewed: 02/12/2013 [facility] Patient Information 2015 [place], [place]. This information is not intended to replace advice given to you by your health care provider. Make sure you discuss any questions you have with your health care provider.  Glaucoma Glaucoma happens when the fluid pressure in the eyeball is too high. The pressure cannot stay high for too long, or the eyeball may become damaged. Signs of glaucoma include:  Having a hard time seeing in a dark room after being in a bright one.  Having trouble  seeing things out to the sides of you.  Blurry sight.  Seeing bright white lights or colors in front of your eyes.  Headaches.  Feeling sick to your stomach (nauseous) or throwing up (vomiting).  Sudden vision loss. Glaucoma testing is an important part of taking care of your eyesight. HOME CARE  Always use your eyedrops or pills as told by your doctor. Do not run out.  Do not go away from home without your eyedrops or pills.  Keep your appointments.  Always tell a new doctor that you have glaucoma and how long you have had it. Tell the doctor about the eyedrops and pills you take.  Do not use eyedrops or pills that have not been prescribed by your doctor. GET HELP RIGHT AWAY IF:  You develop severe pain in the affected eye.  You develop vision problems.  You develop a bad headache in the area around the eye.  You feel sick to your stomach or throw up.  You start to have problems with the other eye. MAKE SURE YOU:   Understand these instructions.  Will watch your condition.  Will get help right away if you are not doing well or get worse. Document Released: 06/14/2008 Document Revised: 11/28/2011 Document Reviewed: 06/14/2008 [facility] Patient Information 2015 [place], [place]. This information is not intended to replace advice given to you by your health care provider. Make sure you discuss any questions you have with your health care provider.  Health Maintenance Adopting a healthy lifestyle and getting preventive care can go a long way to promote health and wellness. Talk with your health care provider about what schedule of regular examinations is right for you. This is a good chance for you to check in with your provider about disease prevention and staying healthy. In between checkups, there are plenty of things you can do on your own. Experts have done a lot of research about which lifestyle changes and preventive measures are most likely to keep you healthy. Ask  your health care provider for more information. WEIGHT AND DIET  Eat a healthy diet  Be sure to include plenty of vegetables, fruits, low-fat dairy products, and lean protein.  Do not eat a lot of foods high in solid fats, added sugars, or salt.  Get regular exercise. This is one of the most important things you can do for your health.  Most adults should exercise for at least 150 minutes each week. The exercise should increase your heart rate and make you sweat (moderate-intensity exercise).  Most adults should also do strengthening exercises at least twice a week. This is in addition to the moderate-intensity exercise.  Maintain a healthy weight  Body mass index (BMI) is a measurement that can be used to identify possible weight problems. It estimates body fat based on height and weight. Your  health care provider can help determine your BMI and help you achieve or maintain a healthy weight.  For females 58 years of age and older:   A BMI below 18.5 is considered underweight.  A BMI of 18.5 to 24.9 is normal.  A BMI of 25 to 29.9 is considered overweight.  A BMI of 30 and above is considered obese.  Watch levels of cholesterol and blood lipids  You should start having your blood tested for lipids and cholesterol at 69 years of age, then have this test every 5 years.  You may need to have your cholesterol levels checked more often if:  Your lipid or cholesterol levels are high.  You are older than 69 years of age.  You are at high risk for heart disease.  CANCER SCREENING   Lung Cancer  Lung cancer screening is recommended for adults 33-41 years old who are at high risk for lung cancer because of a history of smoking.  A yearly low-dose CT scan of the lungs is recommended for people who:  Currently smoke.  Have quit within the past 15 years.  Have at least a 30-pack-year history of smoking. A pack year is smoking an average of one pack of cigarettes a day for 1  year.  Yearly screening should continue until it has been 15 years since you quit.  Yearly screening should stop if you develop a health problem that would prevent you from having lung cancer treatment.  Breast Cancer  Practice breast self-awareness. This means understanding how your breasts normally appear and feel.  It also means doing regular breast self-exams. Let your health care provider know about any changes, no matter how small.  If you are in your 20s or 30s, you should have a clinical breast exam (CBE) by a health care provider every 1-3 years as part of a regular health exam.  If you are 60 or older, have a CBE every year. Also consider having a breast X-ray (mammogram) every year.  If you have a family history of breast cancer, talk to your health care provider about genetic screening.  If you are at high risk for breast cancer, talk to your health care provider about having an MRI and a mammogram every year.  Breast cancer gene (BRCA) assessment is recommended for women who have family members with BRCA-related cancers. BRCA-related cancers include:  Breast.  Ovarian.  Tubal.  Peritoneal cancers.  Results of the assessment will determine the need for genetic counseling and BRCA1 and BRCA2 testing. Cervical Cancer Routine pelvic examinations to screen for cervical cancer are no longer recommended for nonpregnant women who are considered low risk for cancer of the pelvic organs (ovaries, uterus, and vagina) and who do not have symptoms. A pelvic examination may be necessary if you have symptoms including those associated with pelvic infections. Ask your health care provider if a screening pelvic exam is right for you.   The Pap test is the screening test for cervical cancer for women who are considered at risk.  If you had a hysterectomy for a problem that was not cancer or a condition that could lead to cancer, then you no longer need Pap tests.  If you are older  than 65 years, and you have had normal Pap tests for the past 10 years, you no longer need to have Pap tests.  If you have had past treatment for cervical cancer or a condition that could lead to cancer, you need Pap tests  and screening for cancer for at least 20 years after your treatment.  If you no longer get a Pap test, assess your risk factors if they change (such as having a new sexual partner). This can affect whether you should start being screened again.  Some women have medical problems that increase their chance of getting cervical cancer. If this is the case for you, your health care provider may recommend more frequent screening and Pap tests.  The human papillomavirus (HPV) test is another test that may be used for cervical cancer screening. The HPV test looks for the virus that can cause cell changes in the cervix. The cells collected during the Pap test can be tested for HPV.  The HPV test can be used to screen women 68 years of age and older. Getting tested for HPV can extend the interval between normal Pap tests from three to five years.  An HPV test also should be used to screen women of any age who have unclear Pap test results.  After 69 years of age, women should have HPV testing as often as Pap tests.  Colorectal Cancer  This type of cancer can be detected and often prevented.  Routine colorectal cancer screening usually begins at 69 years of age and continues through 69 years of age.  Your health care provider may recommend screening at an earlier age if you have risk factors for colon cancer.  Your health care provider may also recommend using home test kits to check for hidden blood in the stool.  A small camera at the end of a tube can be used to examine your colon directly (sigmoidoscopy or colonoscopy). This is done to check for the earliest forms of colorectal cancer.  Routine screening usually begins at age 39.  Direct examination of the colon should be  repeated every 5-10 years through 69 years of age. However, you may need to be screened more often if early forms of precancerous polyps or small growths are found. Skin Cancer  Check your skin from head to toe regularly.  Tell your health care provider about any new moles or changes in moles, especially if there is a change in a mole's shape or color.  Also tell your health care provider if you have a mole that is larger than the size of a pencil eraser.  Always use sunscreen. Apply sunscreen liberally and repeatedly throughout the day.  Protect yourself by wearing long sleeves, pants, a wide-brimmed hat, and sunglasses whenever you are outside. HEART DISEASE, DIABETES, AND HIGH BLOOD PRESSURE   Have your blood pressure checked at least every 1-2 years. High blood pressure causes heart disease and increases the risk of stroke.  If you are between 73 years and 61 years old, ask your health care provider if you should take aspirin to prevent strokes.  Have regular diabetes screenings. This involves taking a blood sample to check your fasting blood sugar level.  If you are at a normal weight and have a low risk for diabetes, have this test once every three years after 69 years of age.  If you are overweight and have a high risk for diabetes, consider being tested at a younger age or more often. PREVENTING INFECTION  Hepatitis B  If you have a higher risk for hepatitis B, you should be screened for this virus. You are considered at high risk for hepatitis B if:  You were born in a country where hepatitis B is common. Ask your health  care provider which countries are considered high risk.  Your parents were born in a high-risk country, and you have not been immunized against hepatitis B (hepatitis B vaccine).  You have HIV or AIDS.  You use needles to inject street drugs.  You live with someone who has hepatitis B.  You have had sex with someone who has hepatitis B.  You get  hemodialysis treatment.  You take certain medicines for conditions, including cancer, organ transplantation, and autoimmune conditions. Hepatitis C  Blood testing is recommended for:  Everyone born from [birth year] through 1965.  Anyone with known risk factors for hepatitis C. Sexually transmitted infections (STIs)  You should be screened for sexually transmitted infections (STIs) including gonorrhea and chlamydia if:  You are sexually active and are younger than 69 years of age.  You are older than 69 years of age and your health care provider tells you that you are at risk for this type of infection.  Your sexual activity has changed since you were last screened and you are at an increased risk for chlamydia or gonorrhea. Ask your health care provider if you are at risk.  If you do not have HIV, but are at risk, it may be recommended that you take a prescription medicine daily to prevent HIV infection. This is called pre-exposure prophylaxis (PrEP). You are considered at risk if:  You are sexually active and do not regularly use condoms or know the HIV status of your partner(s).  You take drugs by injection.  You are sexually active with a partner who has HIV. Talk with your health care provider about whether you are at high risk of being infected with HIV. If you choose to begin PrEP, you should first be tested for HIV. You should then be tested every 3 months for as long as you are taking PrEP.  PREGNANCY   If you are premenopausal and you may become pregnant, ask your health care provider about preconception counseling.  If you may become pregnant, take 400 to 800 micrograms (mcg) of folic acid every day.  If you want to prevent pregnancy, talk to your health care provider about birth control (contraception). OSTEOPOROSIS AND MENOPAUSE   Osteoporosis is a disease in which the bones lose minerals and strength with aging. This can result in serious bone fractures. Your risk for  osteoporosis can be identified using a bone density scan.  If you are 31 years of age or older, or if you are at risk for osteoporosis and fractures, ask your health care provider if you should be screened.  Ask your health care provider whether you should take a calcium or vitamin D supplement to lower your risk for osteoporosis.  Menopause may have certain physical symptoms and risks.  Hormone replacement therapy may reduce some of these symptoms and risks. Talk to your health care provider about whether hormone replacement therapy is right for you.  HOME CARE INSTRUCTIONS   Schedule regular health, dental, and eye exams.  Stay current with your immunizations.   Do not use any tobacco products including cigarettes, chewing tobacco, or electronic cigarettes.  If you are pregnant, do not drink alcohol.  If you are breastfeeding, limit how much and how often you drink alcohol.  Limit alcohol intake to no more than 1 drink per day for nonpregnant women. One drink equals 12 ounces of beer, 5 ounces of wine, or 1 ounces of hard liquor.  Do not use street drugs.  Do not share needles.  Ask your health care provider for help if you need support or information about quitting drugs.  Tell your health care provider if you often feel depressed.  Tell your health care provider if you have ever been abused or do not feel safe at home. Document Released: 03/21/2011 Document Revised: 01/20/2014 Document Reviewed: 08/07/2013 [facility] Patient Information 2015 [place], [place]. This information is not intended to replace advice given to you by your health care provider. Make sure you discuss any questions you have with your health care provider.

## 2015-04-02 NOTE — Telephone Encounter (Signed)
Pt requests medication refill.

## 2015-04-02 NOTE — Progress Notes (Signed)
Subjective:   Cynthia [name] is a 69 y.o. female who presents for Medicare Annual (Subsequent) preventive examination.  Review of Systems:  No ROS.  Medicare Wellness Visit.      Objective:     Vitals: BP 138/74 mmHg  Pulse 97  Temp(Src) 98.4 F (36.9 C) (Oral)  Resp 14  Ht 5\' 6"  (1.676 m)  Wt 240 lb (108.863 kg)  BMI 38.76 kg/m2  SpO2 98%  Tobacco History  Smoking status  . Former Smoker -- 20 years  . Types: Cigarettes  . Quit date: 11/17/2012  Smokeless tobacco  . Current User     Ready to quit: Not Answered Counseling given: Not Answered   Past Medical History  Diagnosis Date  . Arthritis   . Hypertension   . Diabetes mellitus without complication   . Thyroid disease   . Hyperlipidemia   . Lung nodule    Past Surgical History  Procedure Laterality Date  . Vaginal delivery      4  . Thyroidectomy      Dr. [name]   Family History  Problem Relation Age of Onset  . Hypertension Mother   . Hypertension Father   . Diabetes Sister    History  Sexual Activity  . Sexual Activity: No    Outpatient Encounter Prescriptions as of 04/02/2015  Medication Sig  . amLODipine (NORVASC) 10 MG tablet Take 1 tablet (10 mg total) by mouth daily.  Marland Kitchen aspirin 81 MG tablet Take 81 mg by mouth daily.  Marland Kitchen atorvastatin (LIPITOR) 20 MG tablet Take 1 tablet (20 mg total) by mouth daily.  . Calcium Carbonate-Vitamin D 600-400 MG-UNIT per tablet Take 1 tablet by mouth daily.  Marland Kitchen glucose blood test strip Use as instructed  . Lancets MISC Checks blood glucose twice daily. Diagnosis Code 250.  Marland Kitchen levothyroxine (SYNTHROID, LEVOTHROID) 125 MCG tablet TAKE 1 TABLET BY MOUTH ONCE A DAY BEFORE BREAKFAST. NEED APPT FOR LABS.  Marland Kitchen losartan-hydrochlorothiazide (HYZAAR) 50-12.5 MG per tablet Take 1 tablet by mouth daily.  . meloxicam (MOBIC) 15 MG tablet TAKE 1 TABLET BY MOUTH ONCE A DAY  . metFORMIN (GLUCOPHAGE) 500 MG tablet Take 1 tablet (500 mg total) by mouth 2 (two) times daily  with a meal.  . traMADol (ULTRAM) 50 MG tablet Take 1 tablet (50 mg total) by mouth every 8 (eight) hours as needed for pain.   No facility-administered encounter medications on file as of 04/02/2015.    Activities of Daily Living In your present state of health, do you have any difficulty performing the following activities: 04/02/2015  Hearing? N  Vision? N  Difficulty concentrating or making decisions? N  Walking or climbing stairs? N  Dressing or bathing? N  Doing errands, shopping? N  Preparing Food and eating ? N  Using the Toilet? N  In the past six months, have you accidently leaked urine? N  Do you have problems with loss of bowel control? N  Managing your Medications? N  Managing your Finances? N  Housekeeping or managing your Housekeeping? N    Patient Care Team: Jackolyn Confer, MD as PCP - General (Internal Medicine) Jackolyn Confer, MD (Internal Medicine)    Assessment:   This is a routine medicare wellness visit for [name].  Exercise Activities and Dietary recommendations Current Exercise Habits:: Home exercise routine, Type of exercise: walking, Time (Minutes): 30, Frequency (Times/Week): 5, Weekly Exercise (Minutes/Week): 150, Intensity: Mild  Goals    . Increase physical activity  Currently walks a track 2 miles per day. Patient wants to be able to walk 3 miles per day by adding a lap when walking the track with her sister.      Fall Risk Fall Risk  04/02/2015 03/07/2014 03/08/2013  Falls in the past year? No No No   Depression Screen PHQ 2/9 Scores 04/02/2015 03/07/2014 03/08/2013  PHQ - 2 Score 0 0 0     Cognitive Testing MMSE - Mini Mental State Exam 04/02/2015  Orientation to time 5  Orientation to Place 5  Registration 3  Attention/ Calculation 5  Recall 3  Language- name 2 objects 2  Language- repeat 1  Language- follow 3 step command 3  Language- read & follow direction 1  Write a sentence 1  Copy design 1  Total score 30     Immunization History  Administered Date(s) Administered  . Influenza,inj,Quad PF,36+ Mos 06/13/2013, 06/13/2014  . Influenza-Unspecified 07/18/2012  . Pneumococcal Conjugate-13 03/07/2014  . Pneumococcal Polysaccharide-23 03/08/2013   Screening Tests Health Maintenance  Topic Date Due  . DEXA SCAN  04/01/2016 (Originally 10/07/2010)  . ZOSTAVAX  04/01/2016 (Originally 10/07/2005)  . TETANUS/TDAP  04/01/2016 (Originally 10/07/1964)  . OPHTHALMOLOGY EXAM  04/13/2015  . INFLUENZA VACCINE  04/20/2015  . HEMOGLOBIN A1C  06/19/2015  . URINE MICROALBUMIN  12/17/2015  . FOOT EXAM  04/01/2016  . MAMMOGRAM  09/25/2016  . COLONOSCOPY  10/18/2017  . PNA vac Low Risk Adult  Completed      Plan:    The goal of the wellness visit is to assist the patient how to close the gaps in care and create a preventative care plan for the patient.    Pt determined a personalized goal; see patient goals.  Bone density scan as appropriate; postponed. Requests to discuss in further detail with PCP at follow up. Calcium and Vit D as appropriate/ Osteoporosis risk reviewed.  Taking meds without issues; no barriers identified.  F/u visit noted with MD and if labs are due: A1C due 06/19/15.  Appointment made. Orders requested.  No Risk for hepatitis or high risk social behavior identified via hepatitis screen.  Educated on shingles and follow up with insurance company for co-pays or charges applied to Part D benefit. Educated on Vaccines; ZOSTAVAX and TDAP postponed per patient request.   Safety issues reviewed; The patient has smoke detectors in the home.  Firearms in the home are kept in a locked area.  Wears seatbelt.  No violence in the home.  The patient was oriented x 3; appropriate in dress and manner and no objective failures at ADL's or IADL's.    Functional status reviewed; no losses in function x 1 year.  End of life planning was discussed; aging in home or other; plans to complete HCPOA  were discussed. No Advanced Directives on file. Patient requests to speak with daughter before accepting any additional information. Encouraged to make an appoinmment with Health Coach when ready to pursue.  Simple Foot Exam Completed. WNL.  During the course of the visit the patient was educated and counseled about the following appropriate screening and preventive services:   Vaccines to include Pneumoccal, Influenza, Hepatitis B, Td, Zostavax, HCV  Electrocardiogram  Cardiovascular Disease  Colorectal cancer screening  Bone density screening  Diabetes screening  Glaucoma screening  Mammography  Nutrition counseling  Patient Instructions (the written plan) was given to the patient. HCPOA discussion and appointment to be completed. Eye exam appointment to be completed.  Patient centered goal.  3 mo f/u visit with PCP-complete.  A1C  Lab appointment- complete.   Varney Biles, LPN  [id number]

## 2015-04-07 ENCOUNTER — Other Ambulatory Visit: Payer: Self-pay | Admitting: Internal Medicine

## 2015-04-07 DIAGNOSIS — E119 Type 2 diabetes mellitus without complications: Secondary | ICD-10-CM

## 2015-05-29 DIAGNOSIS — E119 Type 2 diabetes mellitus without complications: Secondary | ICD-10-CM | POA: Diagnosis not present

## 2015-05-29 LAB — HM DIABETES EYE EXAM

## 2015-06-06 ENCOUNTER — Other Ambulatory Visit: Payer: Self-pay | Admitting: Internal Medicine

## 2015-06-07 ENCOUNTER — Telehealth: Payer: Self-pay | Admitting: *Deleted

## 2015-06-07 DIAGNOSIS — E039 Hypothyroidism, unspecified: Secondary | ICD-10-CM

## 2015-06-07 NOTE — Telephone Encounter (Signed)
Pt needs lab order for TSH. Scheduled for labs next month. Last TSH was check in September 2015. Needs lab for additional refills.

## 2015-06-19 ENCOUNTER — Other Ambulatory Visit: Payer: Self-pay

## 2015-06-23 ENCOUNTER — Other Ambulatory Visit (INDEPENDENT_AMBULATORY_CARE_PROVIDER_SITE_OTHER): Payer: Medicare Other

## 2015-06-23 DIAGNOSIS — E039 Hypothyroidism, unspecified: Secondary | ICD-10-CM | POA: Diagnosis not present

## 2015-06-23 DIAGNOSIS — E119 Type 2 diabetes mellitus without complications: Secondary | ICD-10-CM

## 2015-06-23 LAB — COMPREHENSIVE METABOLIC PANEL
ALT: 21 U/L (ref 0–35)
AST: 24 U/L (ref 0–37)
Albumin: 4.4 g/dL (ref 3.5–5.2)
Alkaline Phosphatase: 72 U/L (ref 39–117)
BILIRUBIN TOTAL: 0.5 mg/dL (ref 0.2–1.2)
BUN: 11 mg/dL (ref 6–23)
CO2: 29 mEq/L (ref 19–32)
Calcium: 9.8 mg/dL (ref 8.4–10.5)
Chloride: 101 mEq/L (ref 96–112)
Creatinine, Ser: 0.64 mg/dL (ref 0.40–1.20)
GFR: 118.08 mL/min (ref >60.00)
GLUCOSE: 107 mg/dL — AB (ref 70–99)
POTASSIUM: 4 meq/L (ref 3.5–5.1)
SODIUM: 141 meq/L (ref 135–145)
TOTAL PROTEIN: 7.2 g/dL (ref 6.0–8.3)

## 2015-06-23 LAB — HEMOGLOBIN A1C: Hgb A1c MFr Bld: 6.4 % (ref 4.6–6.5)

## 2015-06-23 LAB — TSH: TSH: 1.83 u[IU]/mL (ref 0.35–4.50)

## 2015-06-25 ENCOUNTER — Telehealth: Payer: Self-pay | Admitting: Internal Medicine

## 2015-06-25 NOTE — Telephone Encounter (Signed)
Pt called stating she was returning your call from yesterday. Pt came in on 10/4 for lab work. Thank You!

## 2015-07-03 ENCOUNTER — Other Ambulatory Visit: Payer: Self-pay | Admitting: *Deleted

## 2015-07-03 ENCOUNTER — Encounter: Payer: Self-pay | Admitting: Internal Medicine

## 2015-07-03 ENCOUNTER — Ambulatory Visit (INDEPENDENT_AMBULATORY_CARE_PROVIDER_SITE_OTHER): Payer: Medicare Other | Admitting: Internal Medicine

## 2015-07-03 VITALS — BP 122/77 | HR 91 | Temp 98.0°F | Ht 66.0 in | Wt 236.2 lb

## 2015-07-03 DIAGNOSIS — I10 Essential (primary) hypertension: Secondary | ICD-10-CM | POA: Diagnosis not present

## 2015-07-03 DIAGNOSIS — E119 Type 2 diabetes mellitus without complications: Secondary | ICD-10-CM

## 2015-07-03 DIAGNOSIS — E039 Hypothyroidism, unspecified: Secondary | ICD-10-CM

## 2015-07-03 DIAGNOSIS — Z23 Encounter for immunization: Secondary | ICD-10-CM | POA: Diagnosis not present

## 2015-07-03 MED ORDER — LEVOTHYROXINE SODIUM 125 MCG PO TABS: ORAL_TABLET | ORAL | Status: DC

## 2015-07-03 NOTE — Assessment & Plan Note (Signed)
Recent A1c was 6.4%. Continue metformin. Follow up 3 months and prn.

## 2015-07-03 NOTE — Progress Notes (Signed)
Subjective:    Patient ID: Cynthia [name], female    DOB: 10/27/1945, 69 y.o.   MRN: [id number]  HPI  69YO female presents for follow up.  DM - Recent A1c 6.4%. Working on [employer] and walking regularly. Compliant with medications.  HTN - Compliant with medications. No CP, HA, palpitations.  No new concerns today. Feeling well.   Wt Readings from Last 3 Encounters:  07/03/15 236 lb 4 oz (107.162 kg)  04/02/15 240 lb (108.863 kg)  03/26/15 239 lb (108.41 kg)   BP Readings from Last 3 Encounters:  07/03/15 122/77  04/02/15 138/74  03/26/15 135/76    Past Medical History  Diagnosis Date  . Arthritis   . Hypertension   . Diabetes mellitus without complication ([place])   . Thyroid disease   . Hyperlipidemia   . Lung nodule    Family History  Problem Relation Age of Onset  . Hypertension Mother   . Hypertension Father   . Diabetes Sister    Past Surgical History  Procedure Laterality Date  . Vaginal delivery      4  . Thyroidectomy      Dr. [name]   Social History   Social History  . Marital Status: Married    Spouse Name: N/A  . Number of Children: N/A  . Years of Education: N/A   Social History Main Topics  . Smoking status: Former Smoker -- 20 years    Types: Cigarettes    Quit date: 11/17/2012  . Smokeless tobacco: Current User  . Alcohol Use: No  . Drug Use: No  . Sexual Activity: No   Other Topics Concern  . None   Social History Narrative   Lives in [place] with husband. Has 4 children.      Work - Retired from [employer]: Negative for fever, chills, appetite change, fatigue and unexpected weight change.  Eyes: Negative for visual disturbance.  Respiratory: Negative for shortness of breath.   Cardiovascular: Negative for chest pain and leg swelling.  Gastrointestinal: Negative for nausea, vomiting, abdominal pain, diarrhea and constipation.  Musculoskeletal: Negative for myalgias and  arthralgias.  Skin: Negative for color change and rash.  Hematological: Negative for adenopathy. Does not bruise/bleed easily.  Psychiatric/Behavioral: Negative for sleep disturbance and dysphoric mood. The patient is not nervous/anxious.        Objective:    BP 122/77 mmHg  Pulse 91  Temp(Src) 98 F (36.7 C) (Oral)  Ht 5\' 6"  (1.676 m)  Wt 236 lb 4 oz (107.162 kg)  BMI 38.15 kg/m2  SpO2 98% Physical Exam  Constitutional: She is oriented to person, place, and time. She appears well-developed and well-nourished. No distress.  HENT:  Head: Normocephalic and atraumatic.  Right Ear: External ear normal.  Left Ear: External ear normal.  Nose: Nose normal.  Mouth/Throat: Oropharynx is clear and moist. No oropharyngeal exudate.  Eyes: Conjunctivae are normal. Pupils are equal, round, and reactive to light. Right eye exhibits no discharge. Left eye exhibits no discharge. No scleral icterus.  Neck: Normal range of motion. Neck supple. No tracheal deviation present. No thyromegaly present.  Cardiovascular: Normal rate, regular rhythm, normal heart sounds and intact distal pulses.  Exam reveals no gallop and no friction rub.   No murmur heard. Pulmonary/Chest: Effort normal and breath sounds normal. No respiratory distress. She has no wheezes. She has no rales. She exhibits no tenderness.  Musculoskeletal: Normal range of motion. She  exhibits no edema or tenderness.  Lymphadenopathy:    She has no cervical adenopathy.  Neurological: She is alert and oriented to person, place, and time. No cranial nerve deficit. She exhibits normal muscle tone. Coordination normal.  Skin: Skin is warm and dry. No rash noted. She is not diaphoretic. No erythema. No pallor.  Psychiatric: She has a normal mood and affect. Her behavior is normal. Judgment and thought content normal.          Assessment & Plan:   Problem List Items Addressed This Visit      Unprioritized   Diabetes mellitus, type 2 ([place])  - Primary    Recent A1c was 6.4%. Continue metformin. Follow up 3 months and prn.      Hypertension    BP Readings from Last 3 Encounters:  07/03/15 122/77  04/02/15 138/74  03/26/15 135/76   BP well controlled. Continue current medications. Recent renal function normal.      Hypothyroidism    Recent TSH was normal. Continue Levothyroxine.          Return in about 3 months (around 10/03/2015) for Physical.

## 2015-07-03 NOTE — Assessment & Plan Note (Signed)
BP Readings from Last 3 Encounters:  07/03/15 122/77  04/02/15 138/74  03/26/15 135/76   BP well controlled. Continue current medications. Recent renal function normal.

## 2015-07-03 NOTE — Patient Instructions (Signed)
Labs and follow up in 3 months

## 2015-07-03 NOTE — Assessment & Plan Note (Signed)
Recent TSH was normal. Continue Levothyroxine.

## 2015-07-03 NOTE — Progress Notes (Signed)
Pre visit review using our clinic review tool, if applicable. No additional management support is needed unless otherwise documented below in the visit note. 

## 2015-09-24 ENCOUNTER — Encounter: Payer: Self-pay | Admitting: Cardiothoracic Surgery

## 2015-09-24 ENCOUNTER — Ambulatory Visit
Admission: RE | Admit: 2015-09-24 | Discharge: 2015-09-24 | Disposition: A | Discharge status: Home / Self Care | Payer: Medicare Other | Source: Ambulatory Visit | Attending: Cardiothoracic Surgery | Admitting: Cardiothoracic Surgery

## 2015-09-24 ENCOUNTER — Inpatient Hospital Stay: Payer: Medicare Other | Attending: Cardiothoracic Surgery | Admitting: Cardiothoracic Surgery

## 2015-09-24 VITALS — BP 144/91 | HR 93 | Temp 97.7°F | Wt 240.7 lb

## 2015-09-24 DIAGNOSIS — R911 Solitary pulmonary nodule: Secondary | ICD-10-CM | POA: Insufficient documentation

## 2015-09-24 DIAGNOSIS — R9389 Abnormal findings on diagnostic imaging of other specified body structures: Secondary | ICD-10-CM

## 2015-09-24 DIAGNOSIS — R0602 Shortness of breath: Secondary | ICD-10-CM | POA: Insufficient documentation

## 2015-09-24 DIAGNOSIS — R938 Abnormal findings on diagnostic imaging of other specified body structures: Secondary | ICD-10-CM | POA: Insufficient documentation

## 2015-09-24 DIAGNOSIS — R918 Other nonspecific abnormal finding of lung field: Secondary | ICD-10-CM | POA: Diagnosis not present

## 2015-09-24 DIAGNOSIS — R05 Cough: Secondary | ICD-10-CM | POA: Diagnosis not present

## 2015-09-24 DIAGNOSIS — R509 Fever, unspecified: Secondary | ICD-10-CM | POA: Insufficient documentation

## 2015-09-24 IMAGING — CT CT CHEST W/O CM
1 of 2 series · 14 of 32 positions shown, 18 images · non-contrast
Comparison: CT chest dated [DATE].  PET-CT [DATE].

CLINICAL DATA: Follow-up pulmonary nodule

EXAM:
CT CHEST WITHOUT CONTRAST
TECHNIQUE: Multidetector CT imaging of the chest was performed following the
standard protocol without IV contrast.

[Series 2: routine chest wo · axial · 0.69mm/px · z∈[-77,+188]mm · 14 of 63 slices shown, 18 images]
[im 5/63  mediastinal]
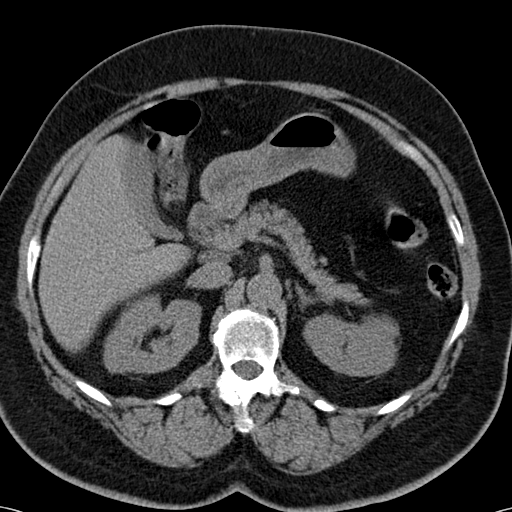
[im 5/63  lung]
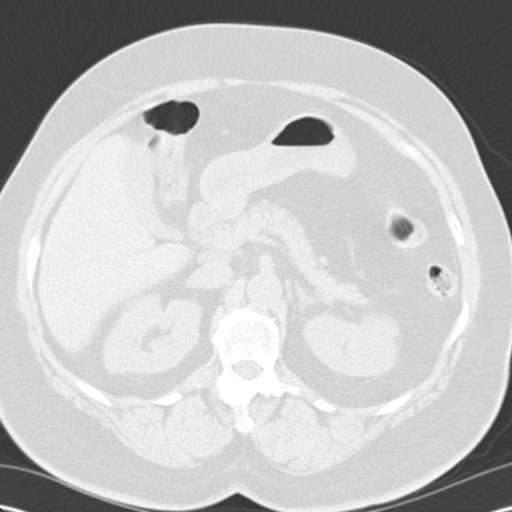
[im 10/63  lung]
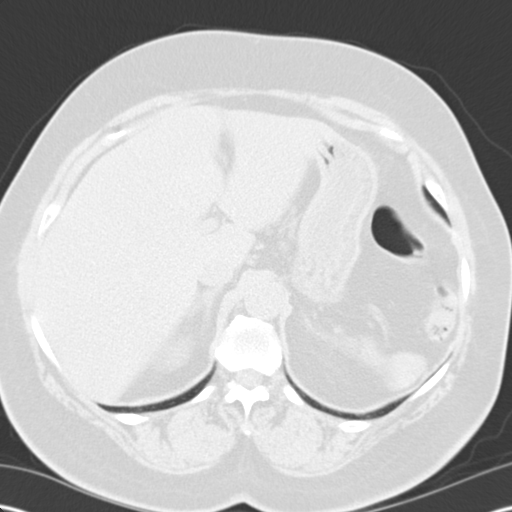
[im 15/63  lung]
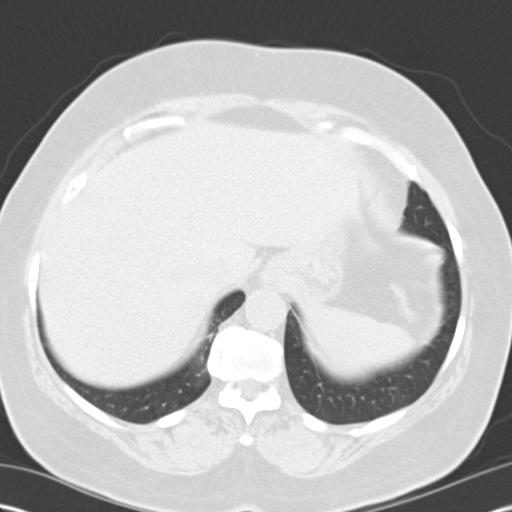
[im 20/63  lung]
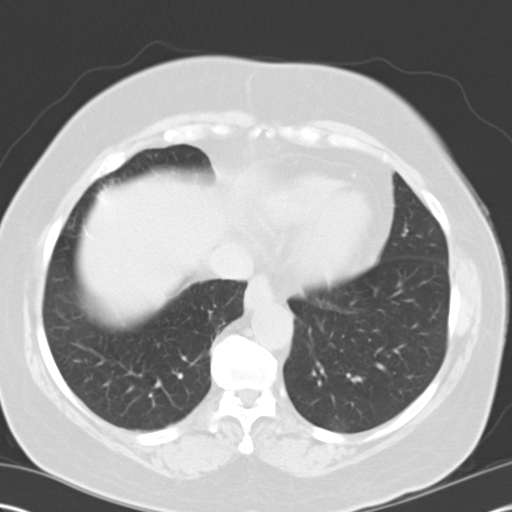
[im 24/63  mediastinal]
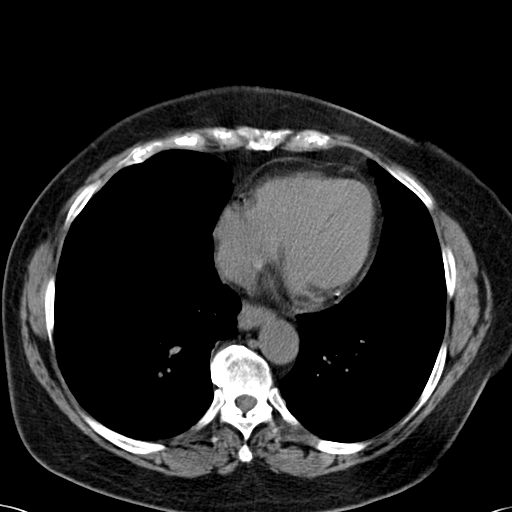
[im 24/63  lung]
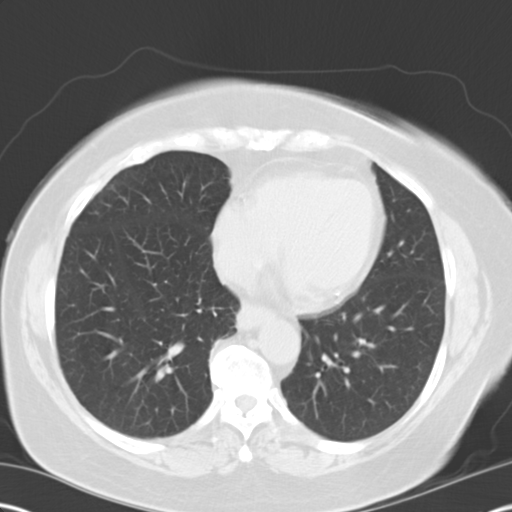
[im 29/63  lung]
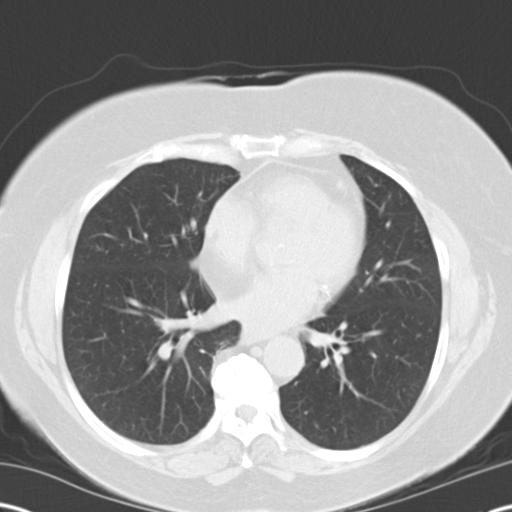
[im 30/63  lung]
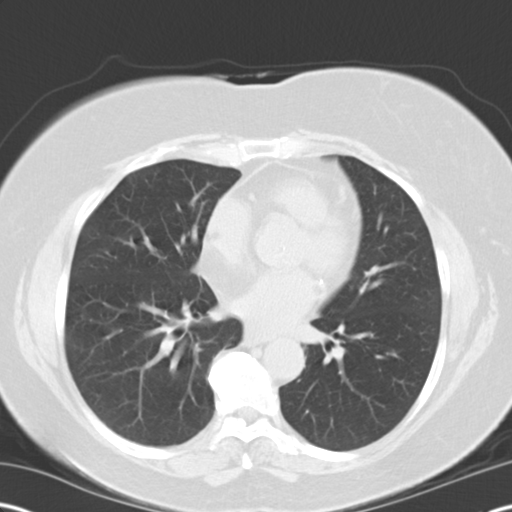
[im 32/63  lung]
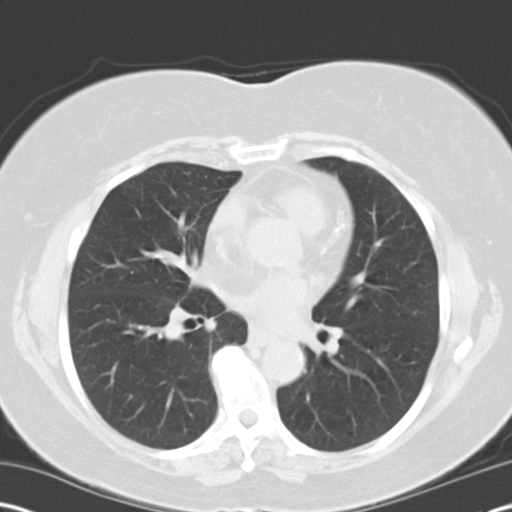
[im 34/63  mediastinal]
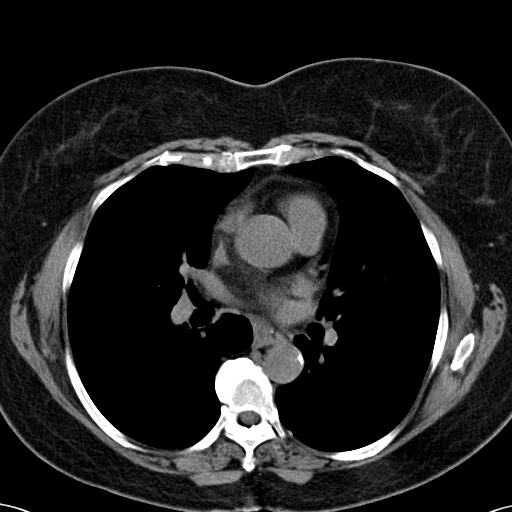
[im 34/63  lung]
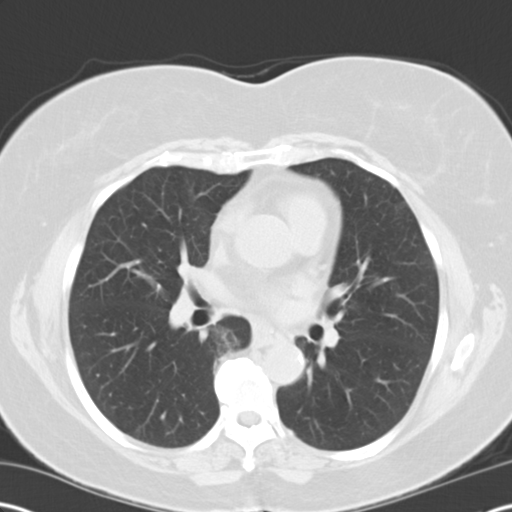
[im 39/63  lung]
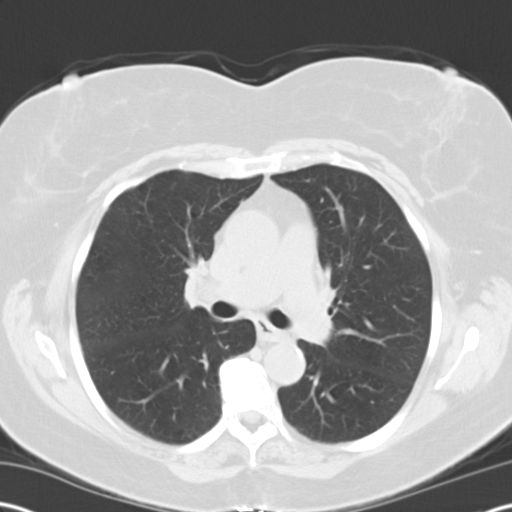
[im 43/63  lung]
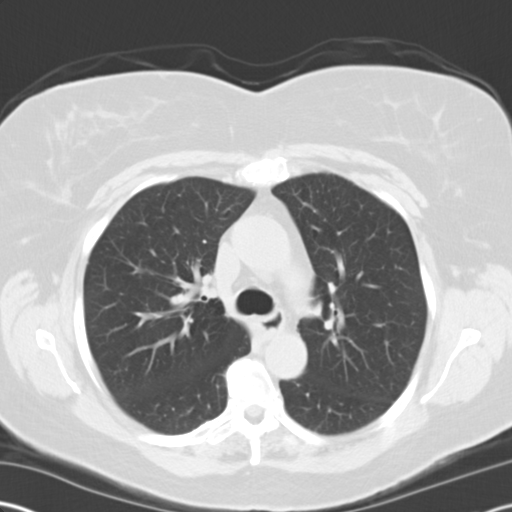
[im 48/63  lung]
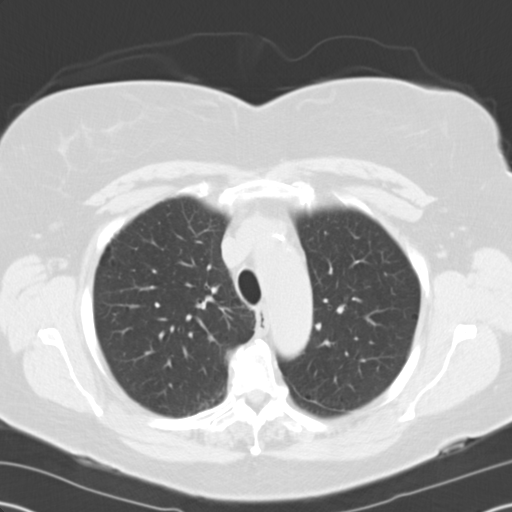
[im 53/63  mediastinal]
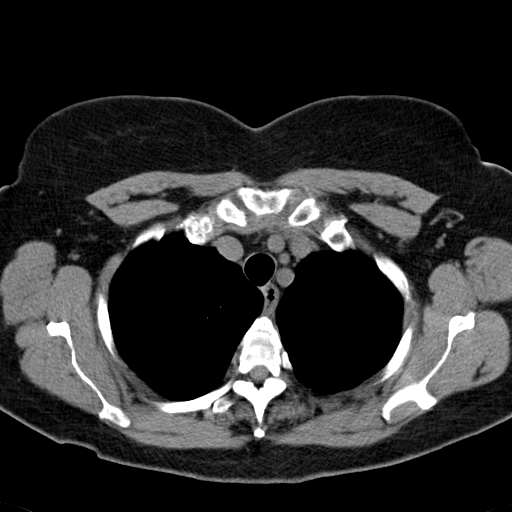
[im 53/63  lung]
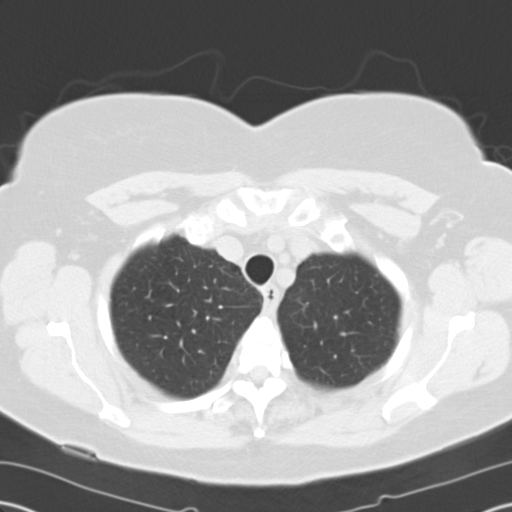
[im 58/63  lung]
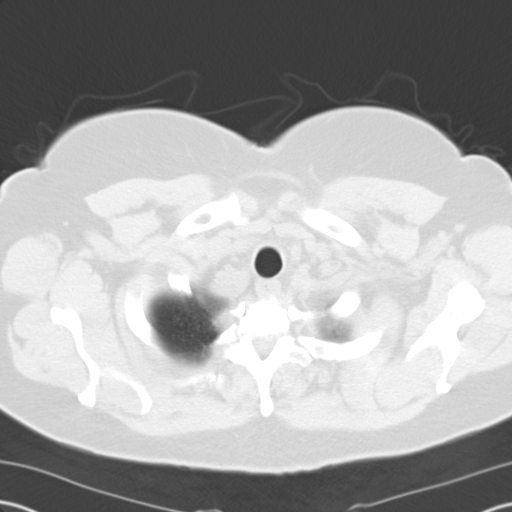

[14 of 32 positions shown; findings below may reference images not displayed]

FINDINGS: Mediastinum/Nodes: The heart is normal in size. No pericardial
effusion.

Coronary atherosclerosis in the LAD and circumflex.

Mild atherosclerotic calcifications aortic arch.

No suspicious mediastinal lymphadenopathy.

Prior thyroidectomy.

Lungs/Pleura: 2 mm nodule in the left apex (series 3/ image 7),
unchanged.

9 x 6 mm nodule in the medial left upper lobe (series 3/image 13),
grossly unchanged since [XS].

No new/ suspicious pulmonary nodules.

Mild centrilobular and paraseptal emphysematous changes. No focal
consolidation.

No pleural effusion or pneumothorax.

Upper abdomen: Visualized upper abdomen is notable for nodular
thickening of the left adrenal gland and vascular calcifications.

Musculoskeletal: Degenerative changes of the visualized
thoracolumbar spine.
IMPRESSION: 9 x 6 mm nodule in the medial left upper lobe, grossly unchanged
since [XS], benign.

No dedicated follow-up imaging is required per [HOSPITAL]
guidelines.

This recommendation follows the consensus statement: Guidelines for
Management of Small Pulmonary Nodules Detected on CT Scans: A
Statement from the [HOSPITAL] as published in Radiology

## 2015-09-24 NOTE — Progress Notes (Signed)
Cynthia [name] Inpatient Post-Op Note  Patient ID: Cynthia [name], female   DOB: 1946/07/20, 70 y.o.   MRN: [id number]  HISTORY: This 70 year old African-American female presents today in follow-up of her lung nodule. The largest nodule is in the left upper lobe and measures about 7-8 mm. This has been followed for an extensive period time. She has no pulmonary complaints. This was discovered on a CT scan of the neck for a goiter. She has no symptoms relative to shortness of breath and cough fever.   Filed Vitals:   09/24/15 0933  BP: 144/91  Pulse: 93  Temp: 97.7 F (36.5 C)     EXAM: Resp: Lungs are clear bilaterally.  No respiratory distress, normal effort. Heart:  Regular without murmurs Abd:  Abdomen is soft, non distended and non tender. No masses are palpable.  There is no rebound and no guarding.  Neurological: Alert and oriented to person, place, and time. Coordination normal.  Skin: Skin is warm and dry. No rash noted. No diaphoretic. No erythema. No pallor.  Psychiatric: Normal mood and affect. Normal behavior. Judgment and thought content normal.    ASSESSMENT: I have independently reviewed the patient's CT scan. I see no change in the dominant left upper lobe nodule. I explained to her I thought that this was most likely a benign lung lesion and that I would await the official radiology report before deciding on any further follow-up. We did schedule her to come back in one year at the CT scan but will base that upon the radiologist final recommendation.   PLAN:   I told the patient lives see her back again in one year with another CT scan unless the radiologist felt that no additional follow-up is required.    Nestor Lewandowsky, MD

## 2015-09-29 ENCOUNTER — Telehealth: Payer: Self-pay | Admitting: *Deleted

## 2015-09-29 NOTE — Telephone Encounter (Signed)
Unable to reach patient. Will try to call later this week.

## 2015-10-07 ENCOUNTER — Encounter: Payer: Self-pay | Admitting: Internal Medicine

## 2015-10-16 ENCOUNTER — Other Ambulatory Visit: Payer: Self-pay | Admitting: Internal Medicine

## 2015-10-21 ENCOUNTER — Ambulatory Visit (INDEPENDENT_AMBULATORY_CARE_PROVIDER_SITE_OTHER): Payer: Medicare Other | Admitting: Internal Medicine

## 2015-10-21 ENCOUNTER — Encounter: Payer: Self-pay | Admitting: Internal Medicine

## 2015-10-21 VITALS — BP 139/85 | HR 92 | Temp 98.4°F | Ht 66.0 in | Wt 243.1 lb

## 2015-10-21 DIAGNOSIS — E119 Type 2 diabetes mellitus without complications: Secondary | ICD-10-CM | POA: Diagnosis not present

## 2015-10-21 DIAGNOSIS — Z0000 Encounter for general adult medical examination without abnormal findings: Secondary | ICD-10-CM | POA: Insufficient documentation

## 2015-10-21 DIAGNOSIS — E039 Hypothyroidism, unspecified: Secondary | ICD-10-CM | POA: Diagnosis not present

## 2015-10-21 DIAGNOSIS — Z1239 Encounter for other screening for malignant neoplasm of breast: Secondary | ICD-10-CM

## 2015-10-21 DIAGNOSIS — E785 Hyperlipidemia, unspecified: Secondary | ICD-10-CM

## 2015-10-21 DIAGNOSIS — I10 Essential (primary) hypertension: Secondary | ICD-10-CM | POA: Diagnosis not present

## 2015-10-21 LAB — COMPREHENSIVE METABOLIC PANEL
ALBUMIN: 4.3 g/dL (ref 3.5–5.2)
ALK PHOS: 66 U/L (ref 39–117)
ALT: 20 U/L (ref 0–35)
AST: 24 U/L (ref 0–37)
BUN: 16 mg/dL (ref 6–23)
CHLORIDE: 100 meq/L (ref 96–112)
CO2: 28 mEq/L (ref 19–32)
Calcium: 9.7 mg/dL (ref 8.4–10.5)
Creatinine, Ser: 0.65 mg/dL (ref 0.40–1.20)
GFR: 115.88 mL/min (ref >60.00)
Glucose, Bld: 106 mg/dL — ABNORMAL HIGH (ref 70–99)
POTASSIUM: 3.6 meq/L (ref 3.5–5.1)
Sodium: 138 mEq/L (ref 135–145)
TOTAL PROTEIN: 7.5 g/dL (ref 6.0–8.3)
Total Bilirubin: 0.5 mg/dL (ref 0.2–1.2)

## 2015-10-21 LAB — LIPID PANEL
Cholesterol: 143 mg/dL (ref 0–200)
HDL: 61.1 mg/dL (ref >39.00)
LDL Cholesterol: 70 mg/dL (ref 0–99)
NONHDL: 81.75
Total CHOL/HDL Ratio: 2
Triglycerides: 60 mg/dL (ref 0.0–149.0)
VLDL: 12 mg/dL (ref 0.0–40.0)

## 2015-10-21 LAB — CBC WITH DIFFERENTIAL/PLATELET
BASOS PCT: 0.6 % (ref 0.0–3.0)
Basophils Absolute: 0 10*3/uL (ref 0.0–0.1)
EOS PCT: 2.9 % (ref 0.0–5.0)
Eosinophils Absolute: 0.1 10*3/uL (ref 0.0–0.7)
HCT: 43.1 % (ref 36.0–46.0)
HEMOGLOBIN: 14.2 g/dL (ref 12.0–15.0)
LYMPHS ABS: 1.7 10*3/uL (ref 0.7–4.0)
Lymphocytes Relative: 36.1 % (ref 12.0–46.0)
MCHC: 32.9 g/dL (ref 30.0–36.0)
MCV: 92.5 fl (ref 78.0–100.0)
MONO ABS: 0.7 10*3/uL (ref 0.1–1.0)
Monocytes Relative: 15.4 % — ABNORMAL HIGH (ref 3.0–12.0)
Neutro Abs: 2.1 10*3/uL (ref 1.4–7.7)
Neutrophils Relative %: 45 % (ref 43.0–77.0)
Platelets: 276 10*3/uL (ref 150.0–400.0)
RBC: 4.66 Mil/uL (ref 3.87–5.11)
RDW: 14 % (ref 11.5–15.5)
WBC: 4.8 10*3/uL (ref 4.0–10.5)

## 2015-10-21 LAB — MICROALBUMIN / CREATININE URINE RATIO
Creatinine,U: 83 mg/dL
Microalb Creat Ratio: 4.5 mg/g (ref 0.0–30.0)
Microalb, Ur: 3.7 mg/dL — ABNORMAL HIGH (ref 0.0–1.9)

## 2015-10-21 LAB — HEMOGLOBIN A1C: Hgb A1c MFr Bld: 6.4 % (ref 4.6–6.5)

## 2015-10-21 LAB — TSH: TSH: 2.38 u[IU]/mL (ref 0.35–4.50)

## 2015-10-21 NOTE — Assessment & Plan Note (Signed)
A1c with labs today.

## 2015-10-21 NOTE — Progress Notes (Signed)
Subjective:    Patient ID: Cynthia [name], female    DOB: [birth year], 70 y.o.   MRN: [id number]  HPI  70YO female presents for physical exam.  Feeling well. No concerns today. Notes some dietary indiscretion over the holidays. Has not been walking. Planning to start back.  Mouth sore - Notes some irritation in the right side of her mouth. Not sure if some spicy foods may have caused this. Using salt water rinses and peroxide.  DM - BG mostly near 100. She has her meter with her today.  Wt Readings from Last 3 Encounters:  10/21/15 243 lb 2 oz (110.281 kg)  09/24/15 240 lb 11.9 oz (109.2 kg)  07/03/15 236 lb 4 oz (107.162 kg)   BP Readings from Last 3 Encounters:  10/21/15 139/85  09/24/15 144/91  07/03/15 122/77    Past Medical History  Diagnosis Date  . Arthritis   . Hypertension   . Diabetes mellitus without complication ([place])   . Thyroid disease   . Hyperlipidemia   . Lung nodule    Family History  Problem Relation Age of Onset  . Hypertension Mother   . Hypertension Father   . Diabetes Sister    Past Surgical History  Procedure Laterality Date  . Vaginal delivery      4  . Thyroidectomy      Dr. [name]   Social History   Social History  . Marital Status: Married    Spouse Name: N/A  . Number of Children: N/A  . Years of Education: N/A   Social History Main Topics  . Smoking status: Former Smoker -- 20 years    Types: Cigarettes    Quit date: 11/17/2012  . Smokeless tobacco: Current User  . Alcohol Use: No  . Drug Use: No  . Sexual Activity: No   Other Topics Concern  . None   Social History Narrative   Lives in [place] with husband. Has 4 children.      Work - Retired from [employer]: Negative for fever, chills, appetite change, fatigue and unexpected weight change.  HENT: Positive for mouth sores. Negative for congestion, postnasal drip, rhinorrhea, sinus pressure, sore throat, trouble  swallowing and voice change.   Eyes: Negative for visual disturbance.  Respiratory: Negative for shortness of breath.   Cardiovascular: Negative for chest pain and leg swelling.  Gastrointestinal: Negative for nausea, vomiting, abdominal pain, diarrhea and constipation.  Musculoskeletal: Negative for myalgias and arthralgias.  Skin: Negative for color change and rash.  Hematological: Negative for adenopathy. Does not bruise/bleed easily.  Psychiatric/Behavioral: Negative for sleep disturbance and dysphoric mood. The patient is not nervous/anxious.        Objective:    BP 139/85 mmHg  Pulse 92  Temp(Src) 98.4 F (36.9 C) (Oral)  Ht 5\' 6"  (1.676 m)  Wt 243 lb 2 oz (110.281 kg)  BMI 39.26 kg/m2  SpO2 98% Physical Exam  Constitutional: She is oriented to person, place, and time. She appears well-developed and well-nourished. No distress.  HENT:  Head: Normocephalic and atraumatic.  Right Ear: External ear normal.  Left Ear: External ear normal.  Nose: Nose normal.  Mouth/Throat: Oropharynx is clear and moist. No oropharyngeal exudate.  Mild erythema and excoriation right buccal mucosa  Eyes: Conjunctivae are normal. Pupils are equal, round, and reactive to light. Right eye exhibits no discharge. Left eye exhibits no discharge. No scleral icterus.  Neck: Normal range of motion.  Neck supple. No tracheal deviation present. No thyromegaly present.  Cardiovascular: Normal rate, regular rhythm, normal heart sounds and intact distal pulses.  Exam reveals no gallop and no friction rub.   No murmur heard. Pulmonary/Chest: Effort normal and breath sounds normal. No accessory muscle usage. No tachypnea. No respiratory distress. She has no decreased breath sounds. She has no wheezes. She has no rales. She exhibits no tenderness. Right breast exhibits no inverted nipple, no mass, no nipple discharge, no skin change and no tenderness. Left breast exhibits no inverted nipple, no mass, no nipple  discharge, no skin change and no tenderness. Breasts are symmetrical.  Abdominal: Soft. Bowel sounds are normal. She exhibits no distension and no mass. There is no tenderness. There is no rebound and no guarding.  Musculoskeletal: Normal range of motion. She exhibits no edema or tenderness.  Lymphadenopathy:    She has no cervical adenopathy.  Neurological: She is alert and oriented to person, place, and time. No cranial nerve deficit. She exhibits normal muscle tone. Coordination normal.  Skin: Skin is warm and dry. No rash noted. She is not diaphoretic. No erythema. No pallor.  Psychiatric: She has a normal mood and affect. Her behavior is normal. Judgment and thought content normal.          Assessment & Plan:   Problem List Items Addressed This Visit      Unprioritized   Diabetes mellitus, type 2 ([place])    A1c with labs today.      Relevant Orders   Hemoglobin A1c   Microalbumin / creatinine urine ratio   Hyperlipidemia   Relevant Orders   Comprehensive metabolic panel   Lipid panel   Hypertension   Hypothyroidism   Relevant Orders   TSH   Routine general medical examination at a health care facility - Primary    General medical exam normal today including breast exam. PAP and pelvic deferred given age and preference. Mammogram scheduled. Labs today. Immunizations are UTD. Encouraged healthy diet and exercise. She will call if oral irritation is not improving.      Relevant Orders   CBC with Differential/Platelet    Other Visit Diagnoses    Screening for breast cancer        Relevant Orders    MM Digital Screening        Return in about 3 months (around 01/18/2016) for Recheck of Diabetes.

## 2015-10-21 NOTE — Patient Instructions (Signed)
Health Maintenance, Female Adopting a healthy lifestyle and getting preventive care can go a long way to promote health and wellness. Talk with your health care provider about what schedule of regular examinations is right for you. This is a good chance for you to check in with your provider about disease prevention and staying healthy. In between checkups, there are plenty of things you can do on your own. Experts have done a lot of research about which lifestyle changes and preventive measures are most likely to keep you healthy. Ask your health care provider for more information. WEIGHT AND DIET  Eat a healthy diet  Be sure to include plenty of vegetables, fruits, low-fat dairy products, and lean protein.  Do not eat a lot of foods high in solid fats, added sugars, or salt.  Get regular exercise. This is one of the most important things you can do for your health.  Most adults should exercise for at least 150 minutes each week. The exercise should increase your heart rate and make you sweat (moderate-intensity exercise).  Most adults should also do strengthening exercises at least twice a week. This is in addition to the moderate-intensity exercise.  Maintain a healthy weight  Body mass index (BMI) is a measurement that can be used to identify possible weight problems. It estimates body fat based on height and weight. Your health care provider can help determine your BMI and help you achieve or maintain a healthy weight.  For females 20 years of age and older:   A BMI below 18.5 is considered underweight.  A BMI of 18.5 to 24.9 is normal.  A BMI of 25 to 29.9 is considered overweight.  A BMI of 30 and above is considered obese.  Watch levels of cholesterol and blood lipids  You should start having your blood tested for lipids and cholesterol at 70 years of age, then have this test every 5 years.  You may need to have your cholesterol levels checked more often if:  Your lipid  or cholesterol levels are high.  You are older than 70 years of age.  You are at high risk for heart disease.  CANCER SCREENING   Lung Cancer  Lung cancer screening is recommended for adults 55-80 years old who are at high risk for lung cancer because of a history of smoking.  A yearly low-dose CT scan of the lungs is recommended for people who:  Currently smoke.  Have quit within the past 15 years.  Have at least a 30-pack-year history of smoking. A pack year is smoking an average of one pack of cigarettes a day for 1 year.  Yearly screening should continue until it has been 15 years since you quit.  Yearly screening should stop if you develop a health problem that would prevent you from having lung cancer treatment.  Breast Cancer  Practice breast self-awareness. This means understanding how your breasts normally appear and feel.  It also means doing regular breast self-exams. Let your health care provider know about any changes, no matter how small.  If you are in your 20s or 30s, you should have a clinical breast exam (CBE) by a health care provider every 1-3 years as part of a regular health exam.  If you are 40 or older, have a CBE every year. Also consider having a breast X-ray (mammogram) every year.  If you have a family history of breast cancer, talk to your health care provider about genetic screening.  If you   are at high risk for breast cancer, talk to your health care provider about having an MRI and a mammogram every year.  Breast cancer gene (BRCA) assessment is recommended for women who have family members with BRCA-related cancers. BRCA-related cancers include:  Breast.  Ovarian.  Tubal.  Peritoneal cancers.  Results of the assessment will determine the need for genetic counseling and BRCA1 and BRCA2 testing. Cervical Cancer Your health care provider may recommend that you be screened regularly for cancer of the pelvic organs (ovaries, uterus, and  vagina). This screening involves a pelvic examination, including checking for microscopic changes to the surface of your cervix (Pap test). You may be encouraged to have this screening done every 3 years, beginning at age 21.  For women ages 30-65, health care providers may recommend pelvic exams and Pap testing every 3 years, or they may recommend the Pap and pelvic exam, combined with testing for human papilloma virus (HPV), every 5 years. Some types of HPV increase your risk of cervical cancer. Testing for HPV may also be done on women of any age with unclear Pap test results.  Other health care providers may not recommend any screening for nonpregnant women who are considered low risk for pelvic cancer and who do not have symptoms. Ask your health care provider if a screening pelvic exam is right for you.  If you have had past treatment for cervical cancer or a condition that could lead to cancer, you need Pap tests and screening for cancer for at least 20 years after your treatment. If Pap tests have been discontinued, your risk factors (such as having a new sexual partner) need to be reassessed to determine if screening should resume. Some women have medical problems that increase the chance of getting cervical cancer. In these cases, your health care provider may recommend more frequent screening and Pap tests. Colorectal Cancer  This type of cancer can be detected and often prevented.  Routine colorectal cancer screening usually begins at 70 years of age and continues through 70 years of age.  Your health care provider may recommend screening at an earlier age if you have risk factors for colon cancer.  Your health care provider may also recommend using home test kits to check for hidden blood in the stool.  A small camera at the end of a tube can be used to examine your colon directly (sigmoidoscopy or colonoscopy). This is done to check for the earliest forms of colorectal  cancer.  Routine screening usually begins at age 50.  Direct examination of the colon should be repeated every 5-10 years through 70 years of age. However, you may need to be screened more often if early forms of precancerous polyps or small growths are found. Skin Cancer  Check your skin from head to toe regularly.  Tell your health care provider about any new moles or changes in moles, especially if there is a change in a mole's shape or color.  Also tell your health care provider if you have a mole that is larger than the size of a pencil eraser.  Always use sunscreen. Apply sunscreen liberally and repeatedly throughout the day.  Protect yourself by wearing long sleeves, pants, a wide-brimmed hat, and sunglasses whenever you are outside. HEART DISEASE, DIABETES, AND HIGH BLOOD PRESSURE   High blood pressure causes heart disease and increases the risk of stroke. High blood pressure is more likely to develop in:  People who have blood pressure in the high end   of the normal range (130-139/85-89 mm Hg).  People who are overweight or obese.  People who are African American.  If you are 38-23 years of age, have your blood pressure checked every 3-5 years. If you are 61 years of age or older, have your blood pressure checked every year. You should have your blood pressure measured twice--once when you are at a hospital or clinic, and once when you are not at a hospital or clinic. Record the average of the two measurements. To check your blood pressure when you are not at a hospital or clinic, you can use:  An automated blood pressure machine at a pharmacy.  A home blood pressure monitor.  If you are between 45 years and 39 years old, ask your health care provider if you should take aspirin to prevent strokes.  Have regular diabetes screenings. This involves taking a blood sample to check your fasting blood sugar level.  If you are at a normal weight and have a low risk for diabetes,  have this test once every three years after 70 years of age.  If you are overweight and have a high risk for diabetes, consider being tested at a younger age or more often. PREVENTING INFECTION  Hepatitis B  If you have a higher risk for hepatitis B, you should be screened for this virus. You are considered at high risk for hepatitis B if:  You were born in a country where hepatitis B is common. Ask your health care provider which countries are considered high risk.  Your parents were born in a high-risk country, and you have not been immunized against hepatitis B (hepatitis B vaccine).  You have HIV or AIDS.  You use needles to inject street drugs.  You live with someone who has hepatitis B.  You have had sex with someone who has hepatitis B.  You get hemodialysis treatment.  You take certain medicines for conditions, including cancer, organ transplantation, and autoimmune conditions. Hepatitis C  Blood testing is recommended for:  Everyone born from [birth year] through 1965.  Anyone with known risk factors for hepatitis C. Sexually transmitted infections (STIs)  You should be screened for sexually transmitted infections (STIs) including gonorrhea and chlamydia if:  You are sexually active and are younger than 70 years of age.  You are older than 70 years of age and your health care provider tells you that you are at risk for this type of infection.  Your sexual activity has changed since you were last screened and you are at an increased risk for chlamydia or gonorrhea. Ask your health care provider if you are at risk.  If you do not have HIV, but are at risk, it may be recommended that you take a prescription medicine daily to prevent HIV infection. This is called pre-exposure prophylaxis (PrEP). You are considered at risk if:  You are sexually active and do not regularly use condoms or know the HIV status of your partner(s).  You take drugs by injection.  You are sexually  active with a partner who has HIV. Talk with your health care provider about whether you are at high risk of being infected with HIV. If you choose to begin PrEP, you should first be tested for HIV. You should then be tested every 3 months for as long as you are taking PrEP.  PREGNANCY   If you are premenopausal and you may become pregnant, ask your health care provider about preconception counseling.  If you may  become pregnant, take 400 to 800 micrograms (mcg) of folic acid every day.  If you want to prevent pregnancy, talk to your health care provider about birth control (contraception). OSTEOPOROSIS AND MENOPAUSE   Osteoporosis is a disease in which the bones lose minerals and strength with aging. This can result in serious bone fractures. Your risk for osteoporosis can be identified using a bone density scan.  If you are 61 years of age or older, or if you are at risk for osteoporosis and fractures, ask your health care provider if you should be screened.  Ask your health care provider whether you should take a calcium or vitamin D supplement to lower your risk for osteoporosis.  Menopause may have certain physical symptoms and risks.  Hormone replacement therapy may reduce some of these symptoms and risks. Talk to your health care provider about whether hormone replacement therapy is right for you.  HOME CARE INSTRUCTIONS   Schedule regular health, dental, and eye exams.  Stay current with your immunizations.   Do not use any tobacco products including cigarettes, chewing tobacco, or electronic cigarettes.  If you are pregnant, do not drink alcohol.  If you are breastfeeding, limit how much and how often you drink alcohol.  Limit alcohol intake to no more than 1 drink per day for nonpregnant women. One drink equals 12 ounces of beer, 5 ounces of wine, or 1 ounces of hard liquor.  Do not use street drugs.  Do not share needles.  Ask your health care provider for help if  you need support or information about quitting drugs.  Tell your health care provider if you often feel depressed.  Tell your health care provider if you have ever been abused or do not feel safe at home.   This information is not intended to replace advice given to you by your health care provider. Make sure you discuss any questions you have with your health care provider.   Document Released: 03/21/2011 Document Revised: 09/26/2014 Document Reviewed: 08/07/2013 Elsevier Interactive Patient Education [employer].

## 2015-10-21 NOTE — Progress Notes (Signed)
Pre visit review using our clinic review tool, if applicable. No additional management support is needed unless otherwise documented below in the visit note. 

## 2015-10-21 NOTE — Assessment & Plan Note (Addendum)
General medical exam normal today including breast exam. PAP and pelvic deferred given age and preference. Mammogram scheduled. Labs today. Immunizations are UTD. Encouraged healthy diet and exercise. She will call if oral irritation is not improving.

## 2015-11-03 ENCOUNTER — Other Ambulatory Visit: Payer: Self-pay | Admitting: Internal Medicine

## 2015-11-03 ENCOUNTER — Ambulatory Visit
Admission: RE | Admit: 2015-11-03 | Discharge: 2015-11-03 | Disposition: A | Discharge status: Home / Self Care | Payer: Medicare Other | Source: Ambulatory Visit | Attending: Internal Medicine | Admitting: Internal Medicine

## 2015-11-03 DIAGNOSIS — Z1239 Encounter for other screening for malignant neoplasm of breast: Secondary | ICD-10-CM

## 2015-11-03 DIAGNOSIS — R928 Other abnormal and inconclusive findings on diagnostic imaging of breast: Secondary | ICD-10-CM

## 2015-11-03 DIAGNOSIS — Z1231 Encounter for screening mammogram for malignant neoplasm of breast: Secondary | ICD-10-CM | POA: Diagnosis not present

## 2015-11-03 IMAGING — MG MM SCREENING BREAST TOMO BILATERAL
8 of 15 series · 8 of 31 positions shown · non-contrast
Comparison: Previous exam(s).

CLINICAL DATA: Screening.

EXAM:
DIGITAL SCREENING BILATERAL MAMMOGRAM WITH 3D TOMO WITH CAD

[R MLO (1 of 2)]
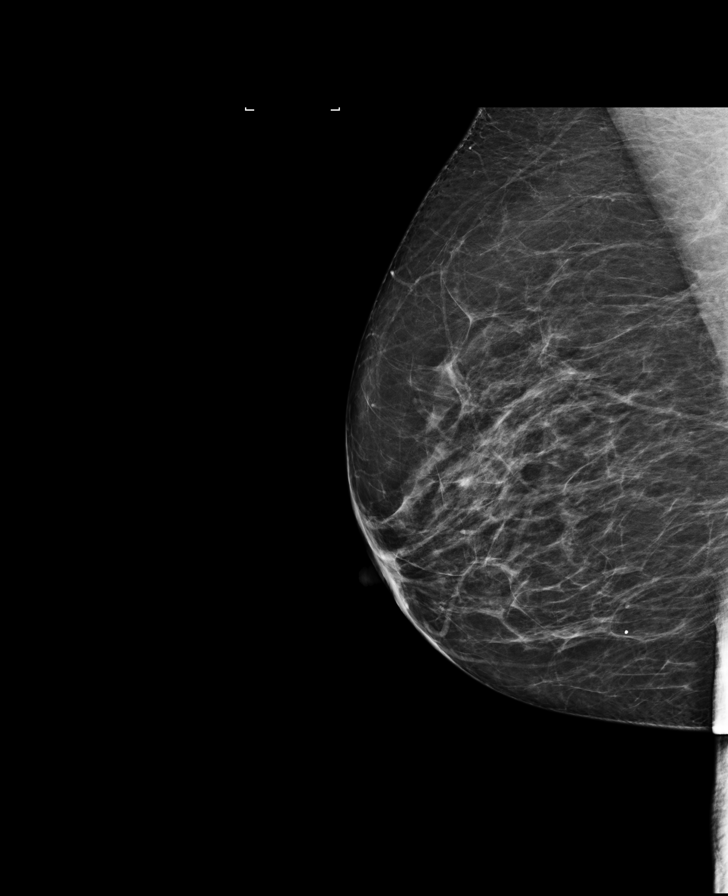

[R MLO (2 of 2)]
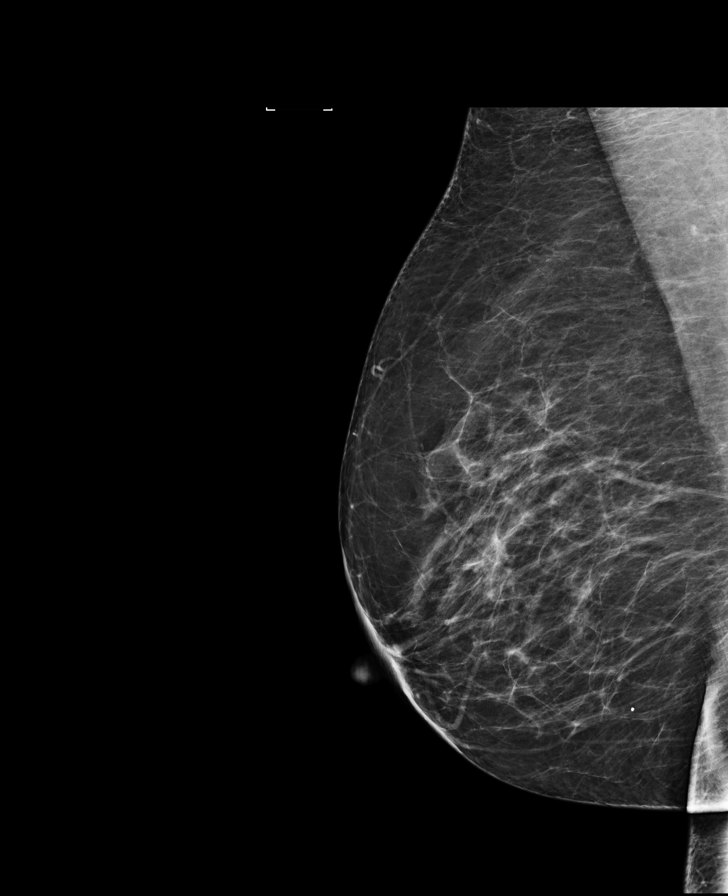

[R MLO synth-2D]
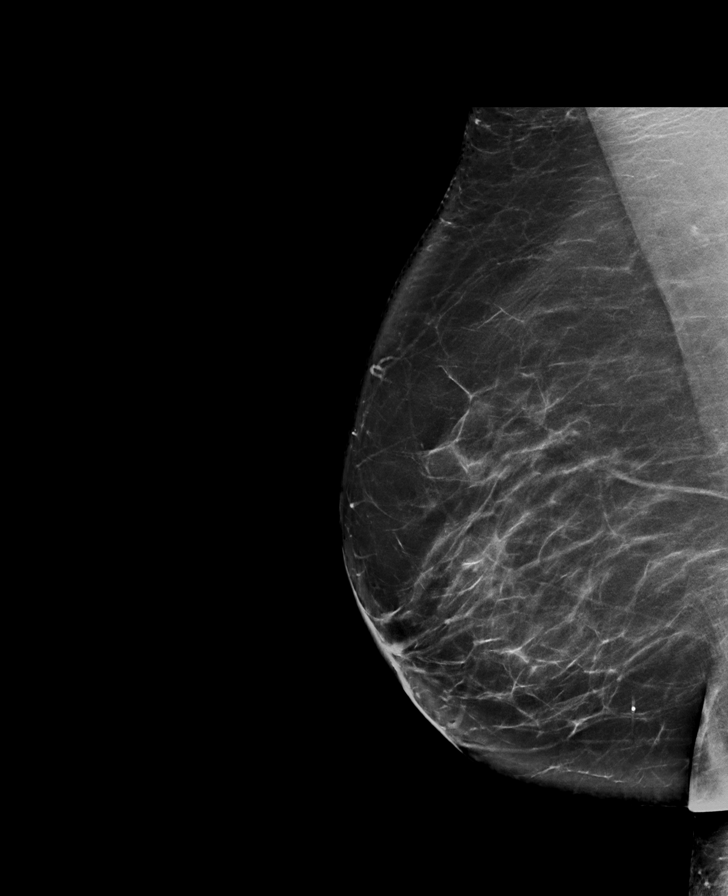

[R CC]
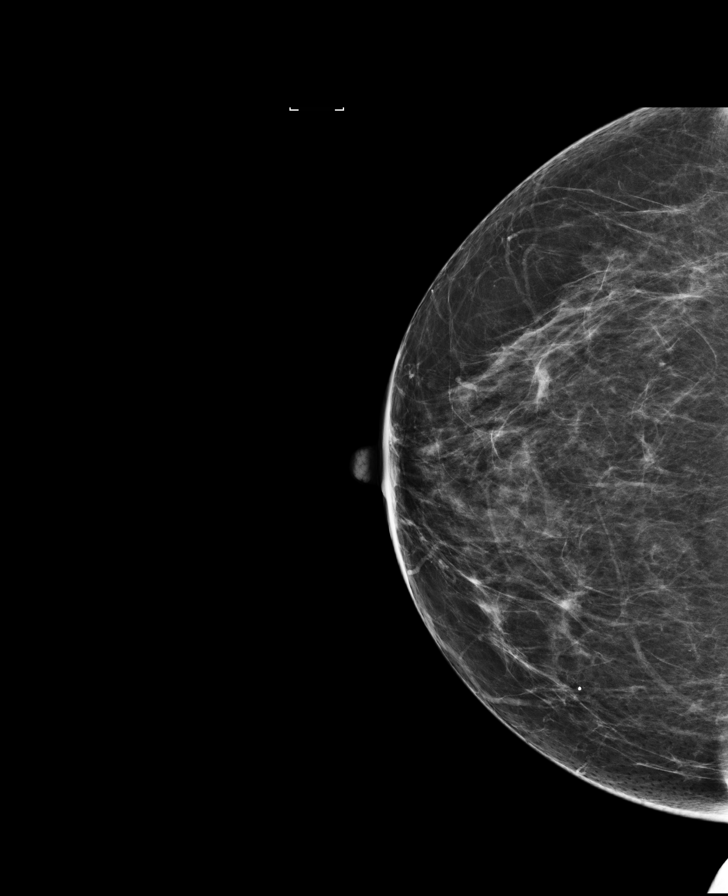

[L MLO]
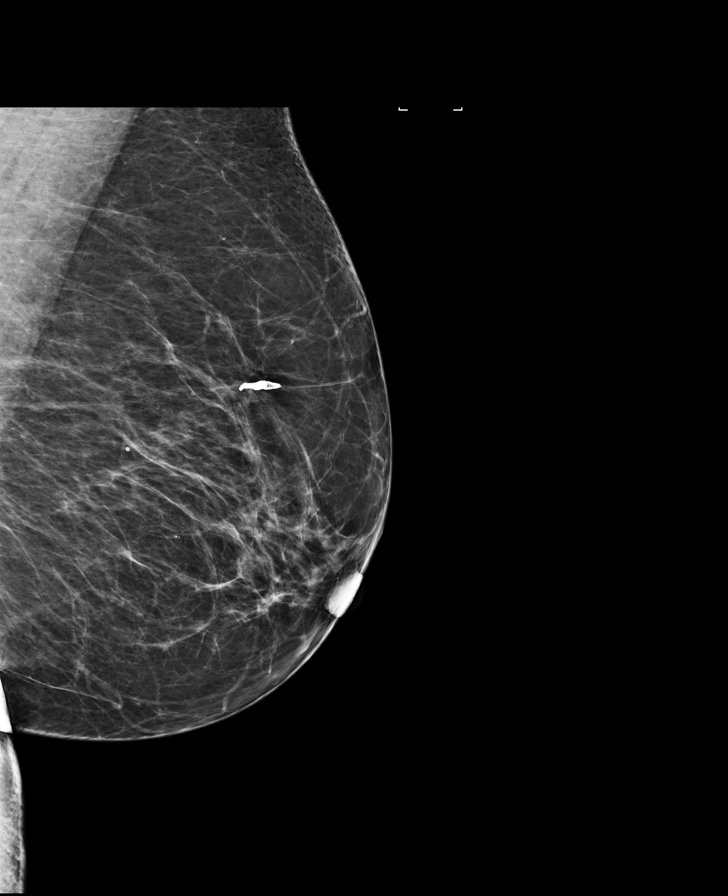

[R CC synth-2D]
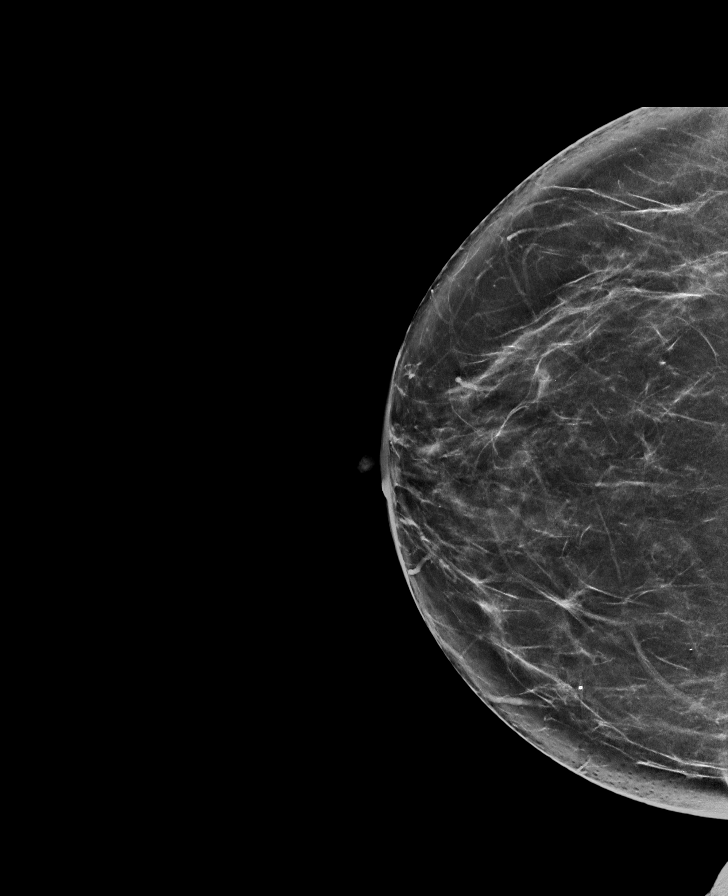

[L CC synth-2D]
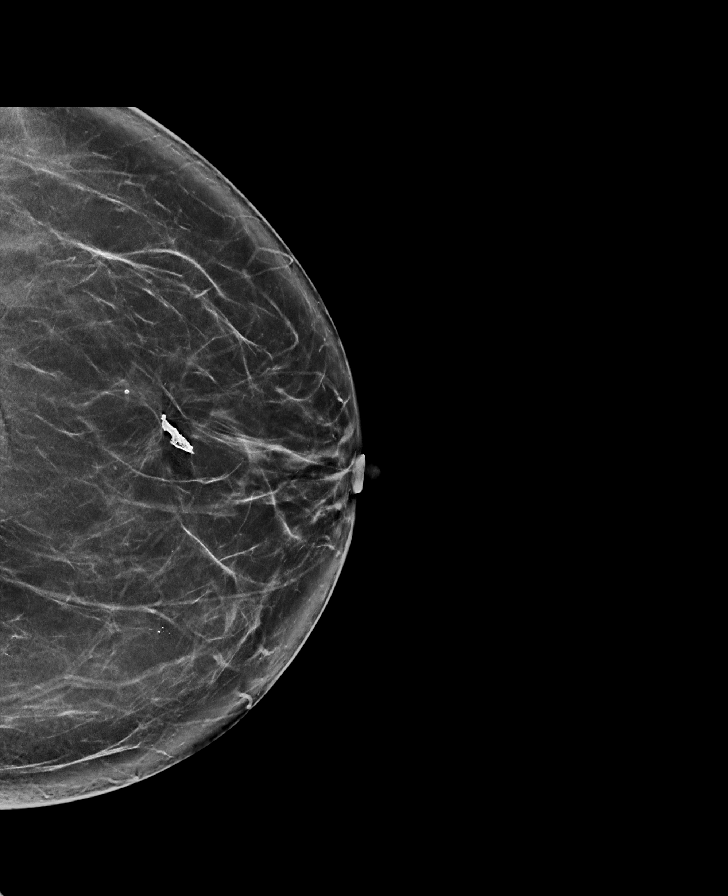

[L MLO synth-2D]
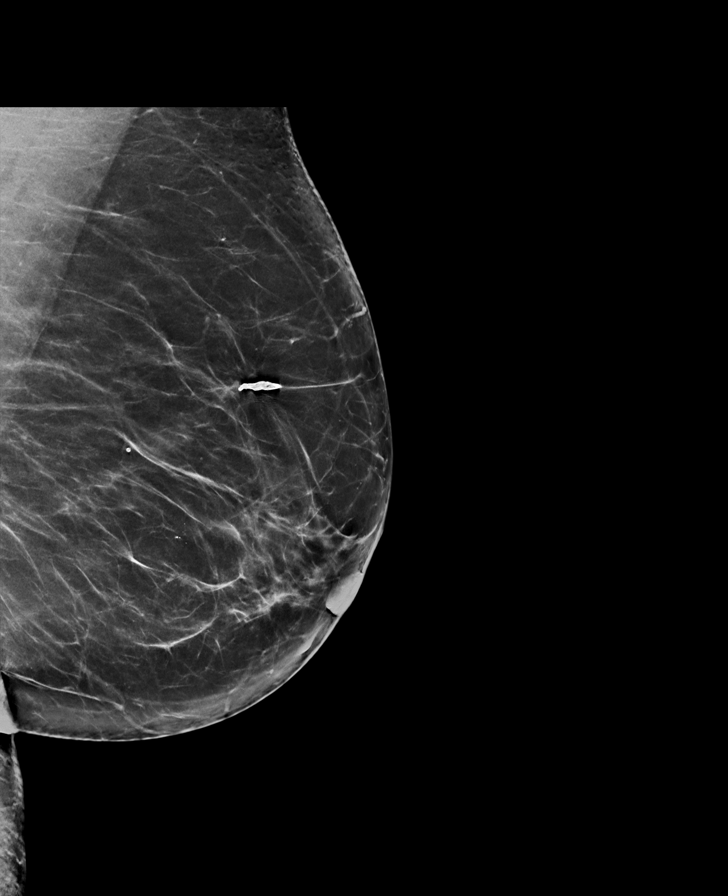

[8 of 31 positions shown; findings below may reference images not displayed]

ACR Breast Density Category b: There are scattered areas of
fibroglandular density.
FINDINGS: In the left breast, calcifications warrant further evaluation. In
the right breast, no findings suspicious for malignancy. Images were
processed with CAD.
IMPRESSION: Further evaluation is suggested for calcifications in the left
breast.

RECOMMENDATION:
Diagnostic mammogram of the left breast. (Code:[RW])

The patient will be contacted regarding the findings, and additional
imaging will be scheduled.

BI-RADS CATEGORY  0: Incomplete. Need additional imaging evaluation
and/or prior mammograms for comparison.

## 2015-11-11 ENCOUNTER — Telehealth: Payer: Self-pay | Admitting: *Deleted

## 2015-11-11 NOTE — Telephone Encounter (Signed)
Left a message with the patients daughter to return our call.  It looks like a call was attempted yesterday to give mammogram results and to make sure she schedules the repeat mammogram, which it looks like she scheduled for tomorrow.

## 2015-11-11 NOTE — Telephone Encounter (Signed)
Patient stated that she received a call on 11/11/15, she was not sure of the reason. She did say that she scheduled her mammogram appt.   Pt [phone]

## 2015-11-12 ENCOUNTER — Other Ambulatory Visit: Payer: Self-pay | Admitting: Internal Medicine

## 2015-11-12 ENCOUNTER — Ambulatory Visit
Admission: RE | Admit: 2015-11-12 | Discharge: 2015-11-12 | Disposition: A | Discharge status: Home / Self Care | Payer: Medicare Other | Source: Ambulatory Visit | Attending: Internal Medicine | Admitting: Internal Medicine

## 2015-11-12 DIAGNOSIS — R921 Mammographic calcification found on diagnostic imaging of breast: Secondary | ICD-10-CM | POA: Insufficient documentation

## 2015-11-12 DIAGNOSIS — R928 Other abnormal and inconclusive findings on diagnostic imaging of breast: Secondary | ICD-10-CM | POA: Diagnosis present

## 2015-11-12 DIAGNOSIS — R920 Mammographic microcalcification found on diagnostic imaging of breast: Secondary | ICD-10-CM

## 2015-11-12 IMAGING — MG MM DIGITAL DIAGNOSTIC UNILAT*L*
4 series · 4 of 4 positions shown · non-contrast
Comparison: Previous exam(s).

CLINICAL DATA: 70-year-old female, callback from screening
mammogram for possible left breast calcifications

EXAM:
DIGITAL DIAGNOSTIC LEFT MAMMOGRAM

[L ML (1 of 2)]
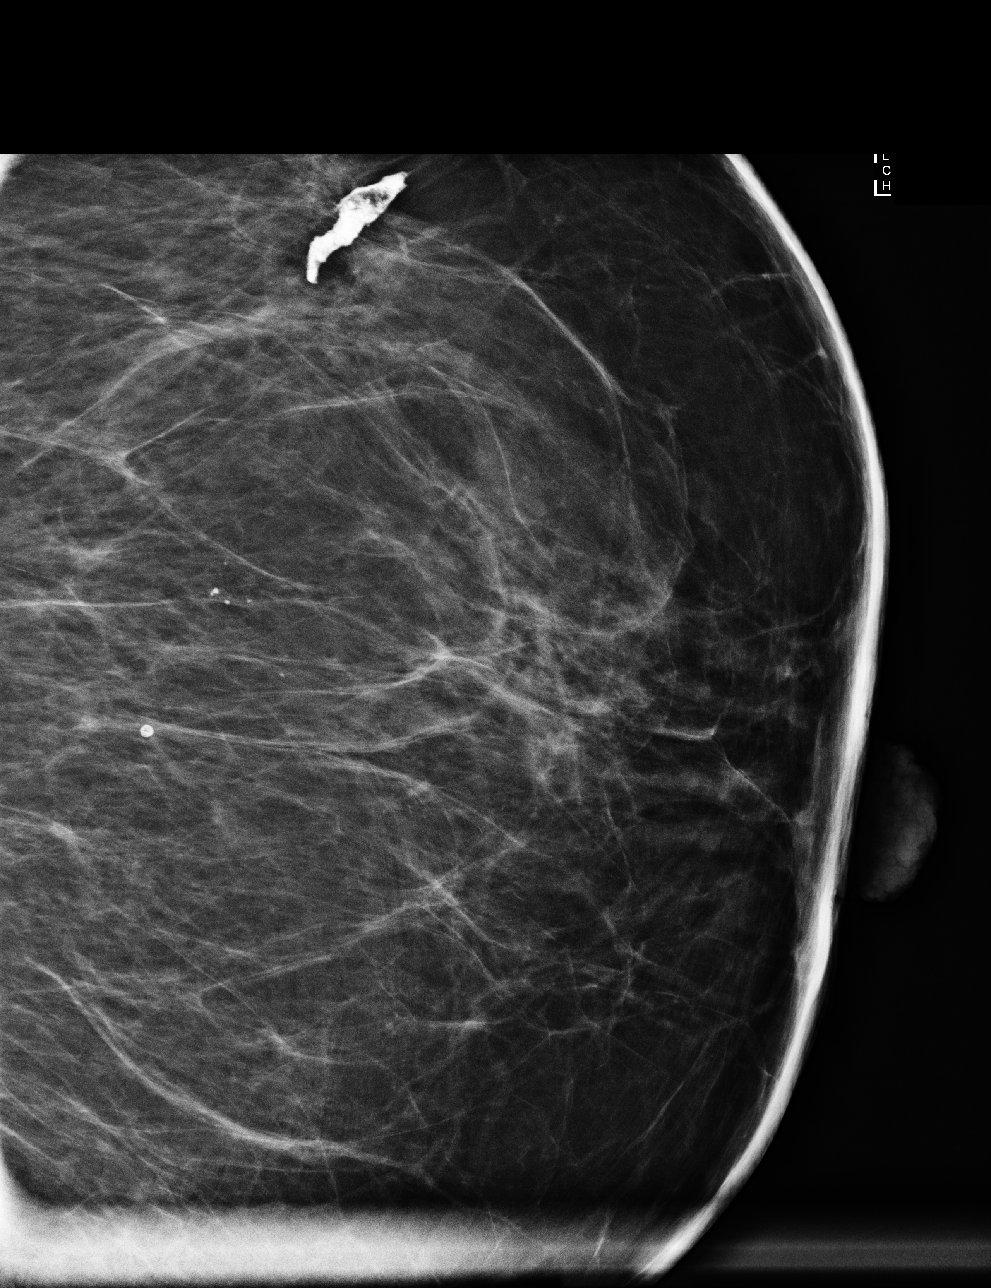

[L CC (1 of 2)]
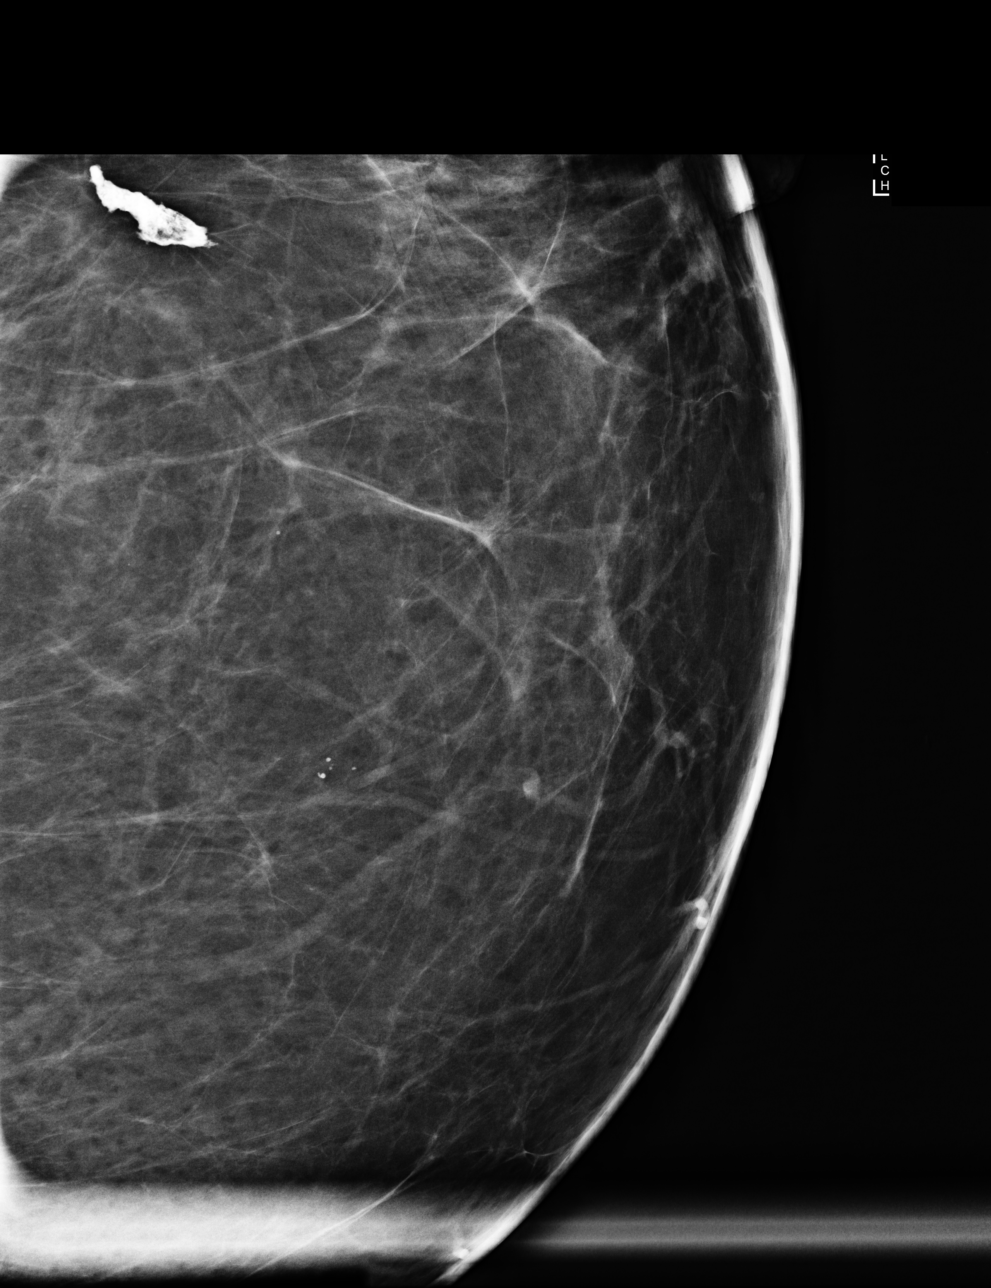

[L CC (2 of 2)]
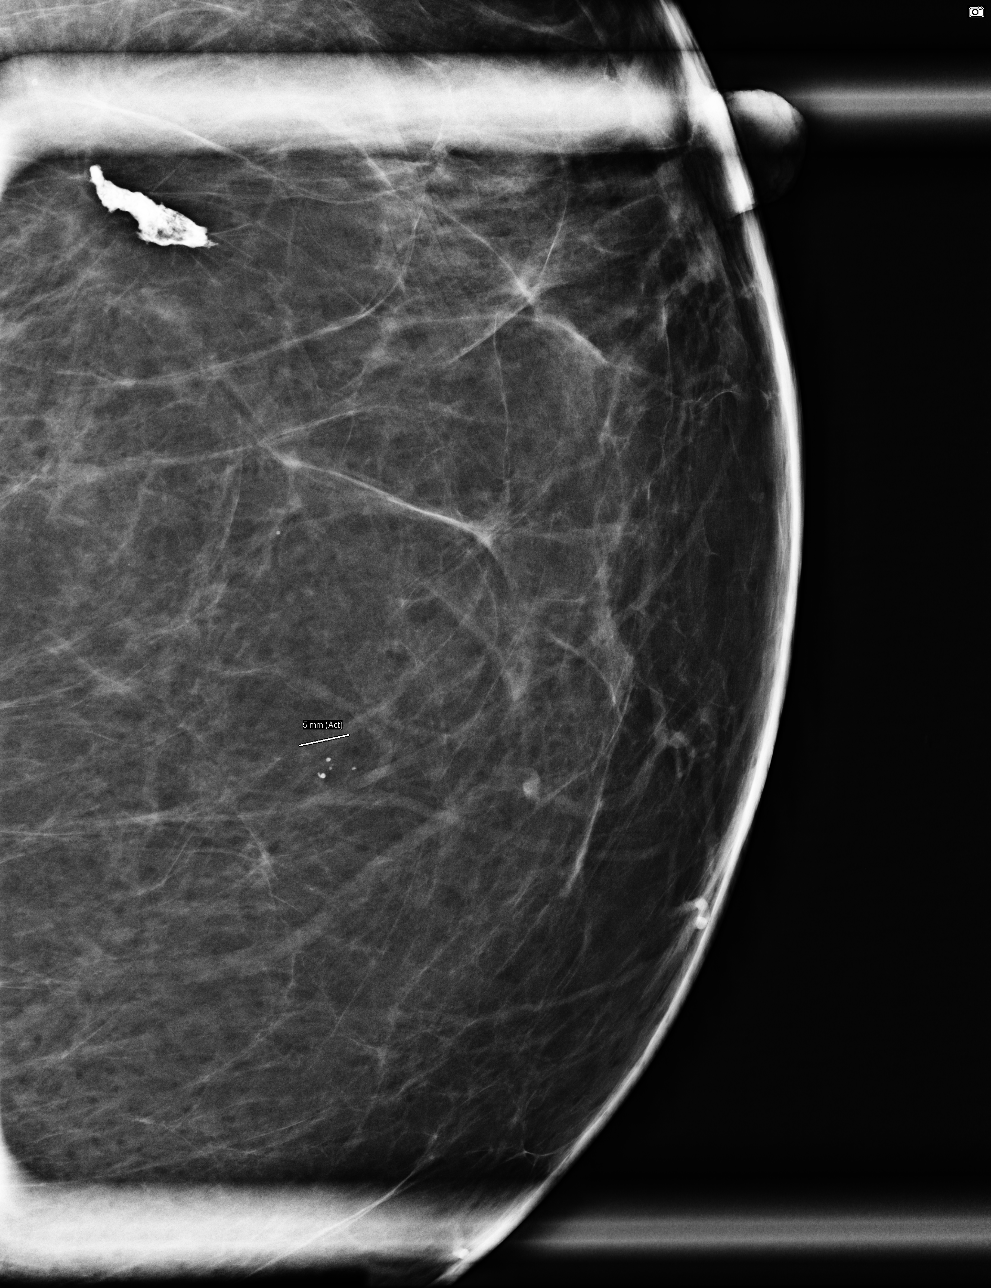

[L ML (2 of 2)]
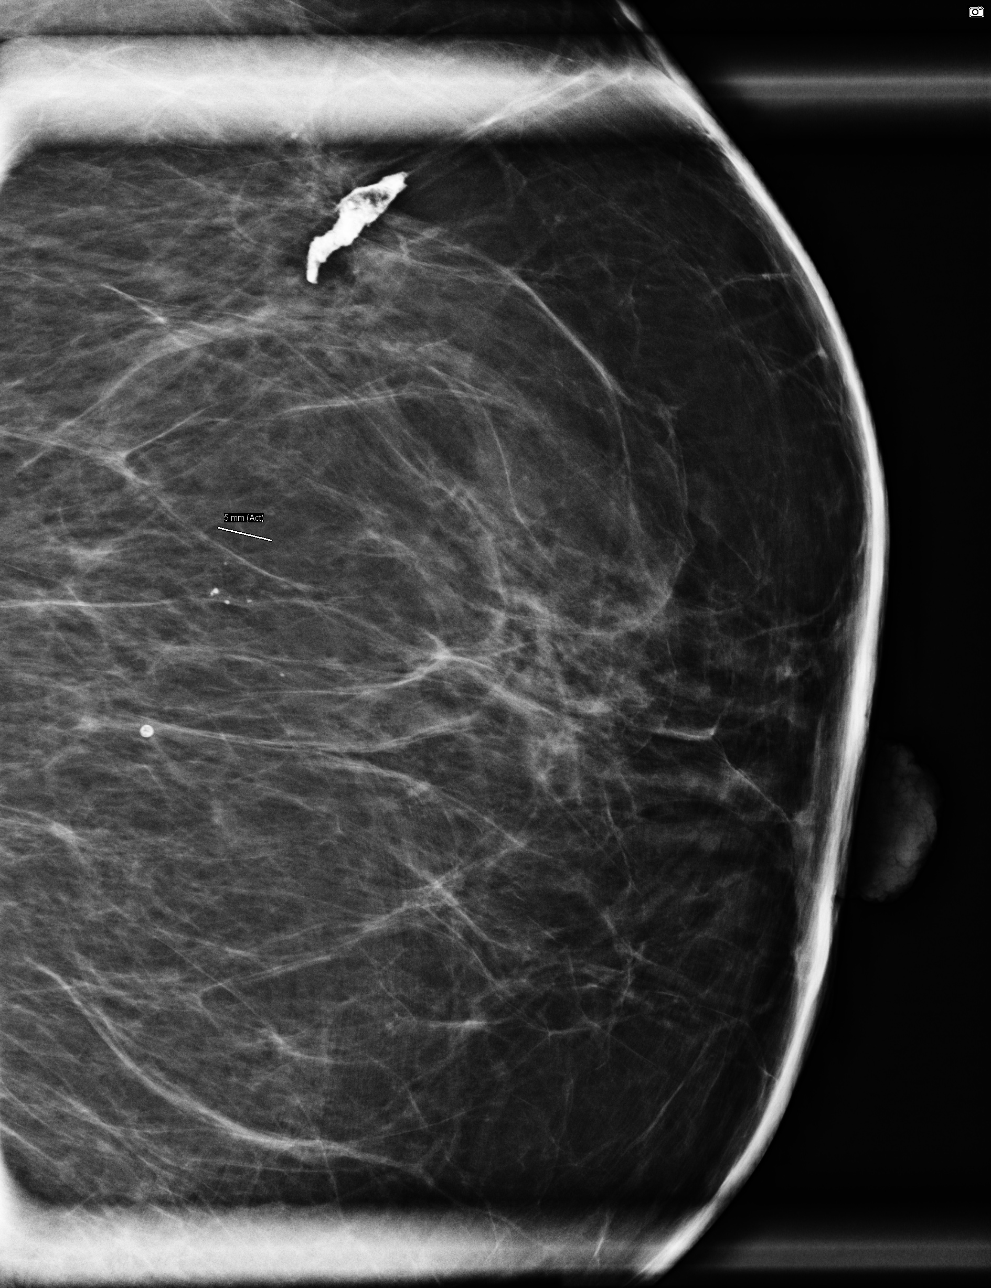

[4 of 4 positions shown; findings below may reference images not displayed]

ACR Breast Density Category b: There are scattered areas of
fibroglandular density.
FINDINGS: Cc and lateral magnification views of the left breast were
performed. On the additional views, there is a 5 mm group of mildly
pleomorphic calcifications within the upper, inner left breast.
These are new from prior mammogram. No associated mass is
identified.
IMPRESSION: Indeterminate left breast calcifications.

RECOMMENDATION:
Stereotactic guided left breast biopsy.

I have discussed the findings and recommendations with the patient.
Results were also provided in writing at the conclusion of the
visit. If applicable, a reminder letter will be sent to the patient
regarding the next appointment.

BI-RADS CATEGORY  4: Suspicious.

## 2015-11-25 ENCOUNTER — Ambulatory Visit
Admission: RE | Admit: 2015-11-25 | Discharge: 2015-11-25 | Disposition: A | Discharge status: Home / Self Care | Payer: Medicare Other | Source: Ambulatory Visit | Attending: Internal Medicine | Admitting: Internal Medicine

## 2015-11-25 DIAGNOSIS — R921 Mammographic calcification found on diagnostic imaging of breast: Secondary | ICD-10-CM | POA: Insufficient documentation

## 2015-11-25 DIAGNOSIS — N641 Fat necrosis of breast: Secondary | ICD-10-CM | POA: Insufficient documentation

## 2015-11-25 DIAGNOSIS — R920 Mammographic microcalcification found on diagnostic imaging of breast: Secondary | ICD-10-CM

## 2015-11-25 HISTORY — PX: BREAST BIOPSY: SHX20

## 2015-11-25 IMAGING — MG MM DIGITAL DIAGNOSTIC UNILAT*L*
2 series · 2 of 2 positions shown · non-contrast
Comparison: Previous exam(s).

CLINICAL DATA: Status post stereotactic biopsy of left breast
calcifications.

EXAM:
DIAGNOSTIC LEFT MAMMOGRAM POST STEREOTACTIC BIOPSY

[L CC]
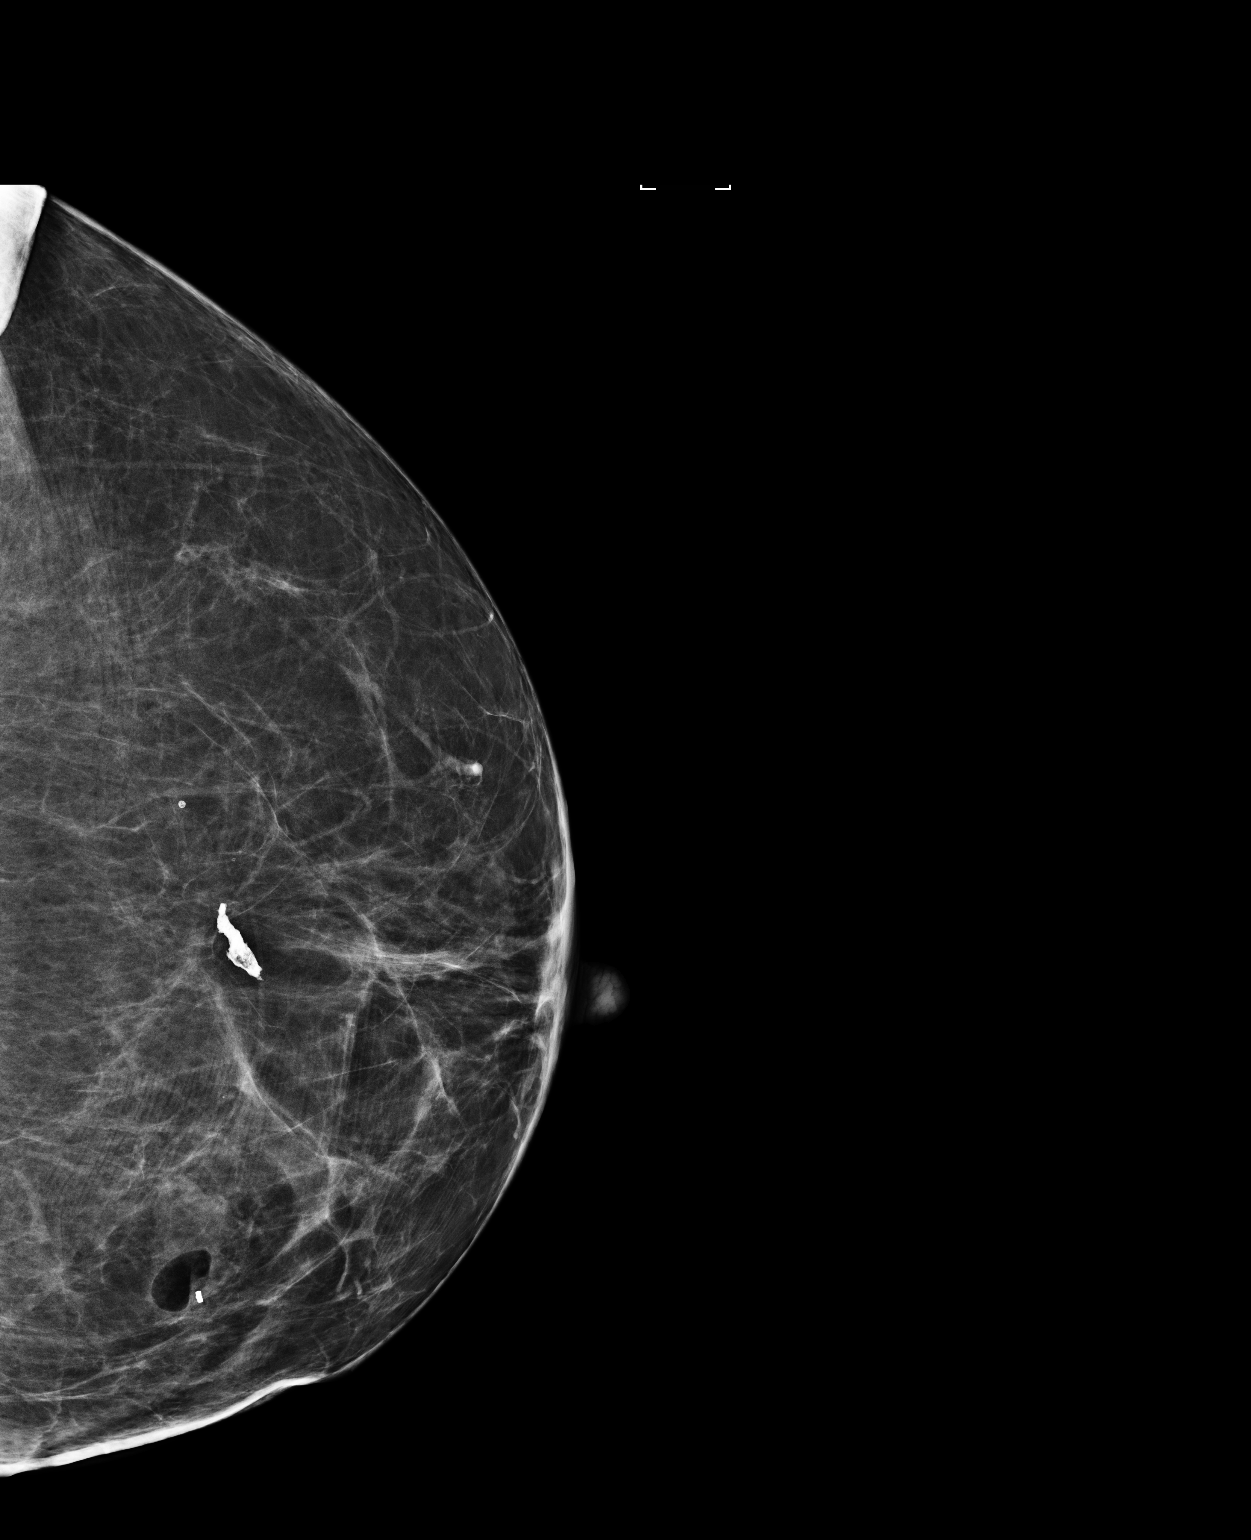

[L ML]
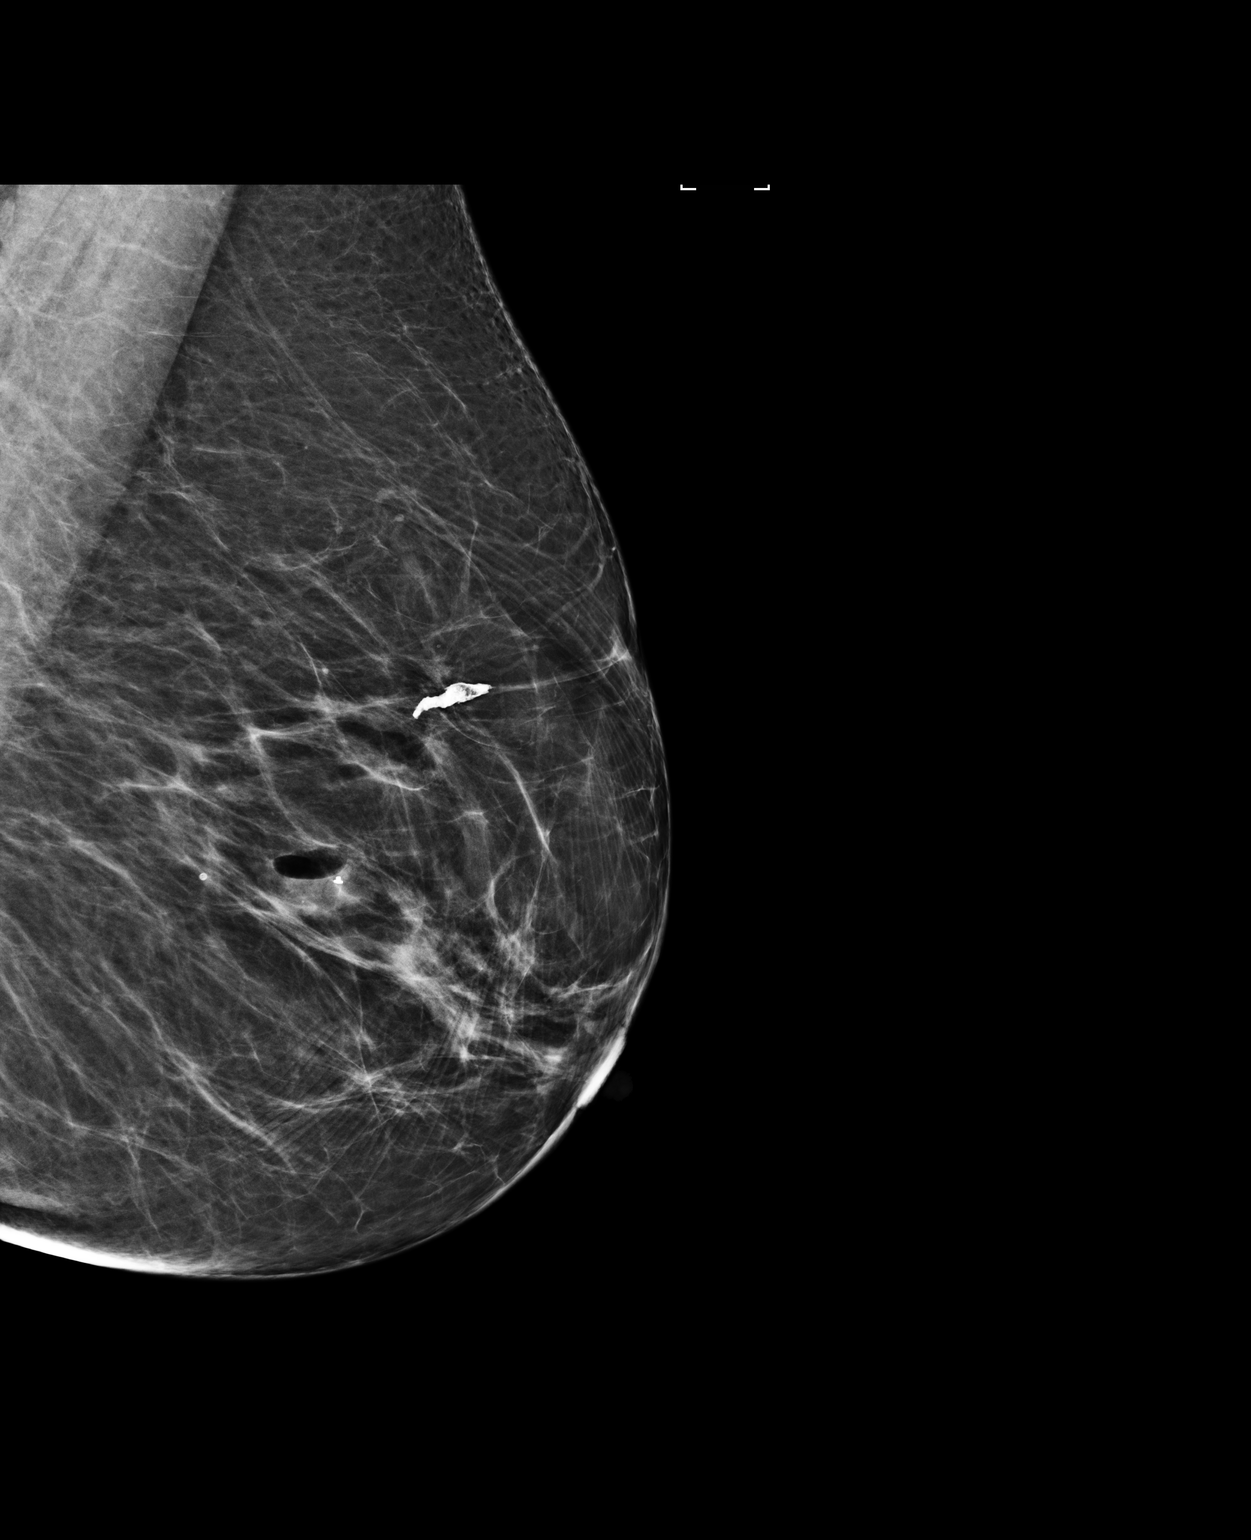

[2 of 2 positions shown; findings below may reference images not displayed]

FINDINGS: Mammographic images were obtained following stereotactic guided
biopsy of calcifications in the upper-inner quadrant of the left
breast. Mammographic images show there is a top hat shaped clip in
the upper-inner quadrant of the left breast.
IMPRESSION: Status post stereotactic biopsy of left breast calcifications with
pathology pending.

Final Assessment: Post Procedure Mammograms for Marker Placement

## 2015-11-25 IMAGING — MG MM BREAST BX W LOC DEV 1ST LESION IMAGE BX SPEC STEREO GUIDE*L*
1 series · 1 of 1 positions shown · non-contrast
Comparison: Previous exams.

ADDENDUM:
Pathology of the left breast biopsy revealed A. BREAST, LEFT, UPPER
INNER QUADRANT; STEREOTACTIC CORE BIOPSY: DYSTROPHIC CALCIFICATIONS
ASSOCIATED WITH AREAS CONSISTENT WITH OLD FAT NECROSIS. NEGATIVE FOR
ATYPIA AND MALIGNANCY. This was found to be concordant by Dr.
KLPIGBB.

Recommendations:  Return to screening mammography in one year.
At the patient's request, pathology and recommendations were relayed
to the patient by phone. She stated she has done well following the
biopsy with only some soreness but no bleeding, bruising, or
hematoma. Post biopsy instructions were reviewed with the patient.
All of her questions were answered. She was encouraged to call the
[REDACTED] with any
further questions or concerns.
Pathology and recommendations relayed by KLPIGBB on
[DATE].
CLINICAL DATA: Left breast calcifications.
EXAM:
LEFT BREAST STEREOTACTIC CORE NEEDLE BIOPSY

[L CC]
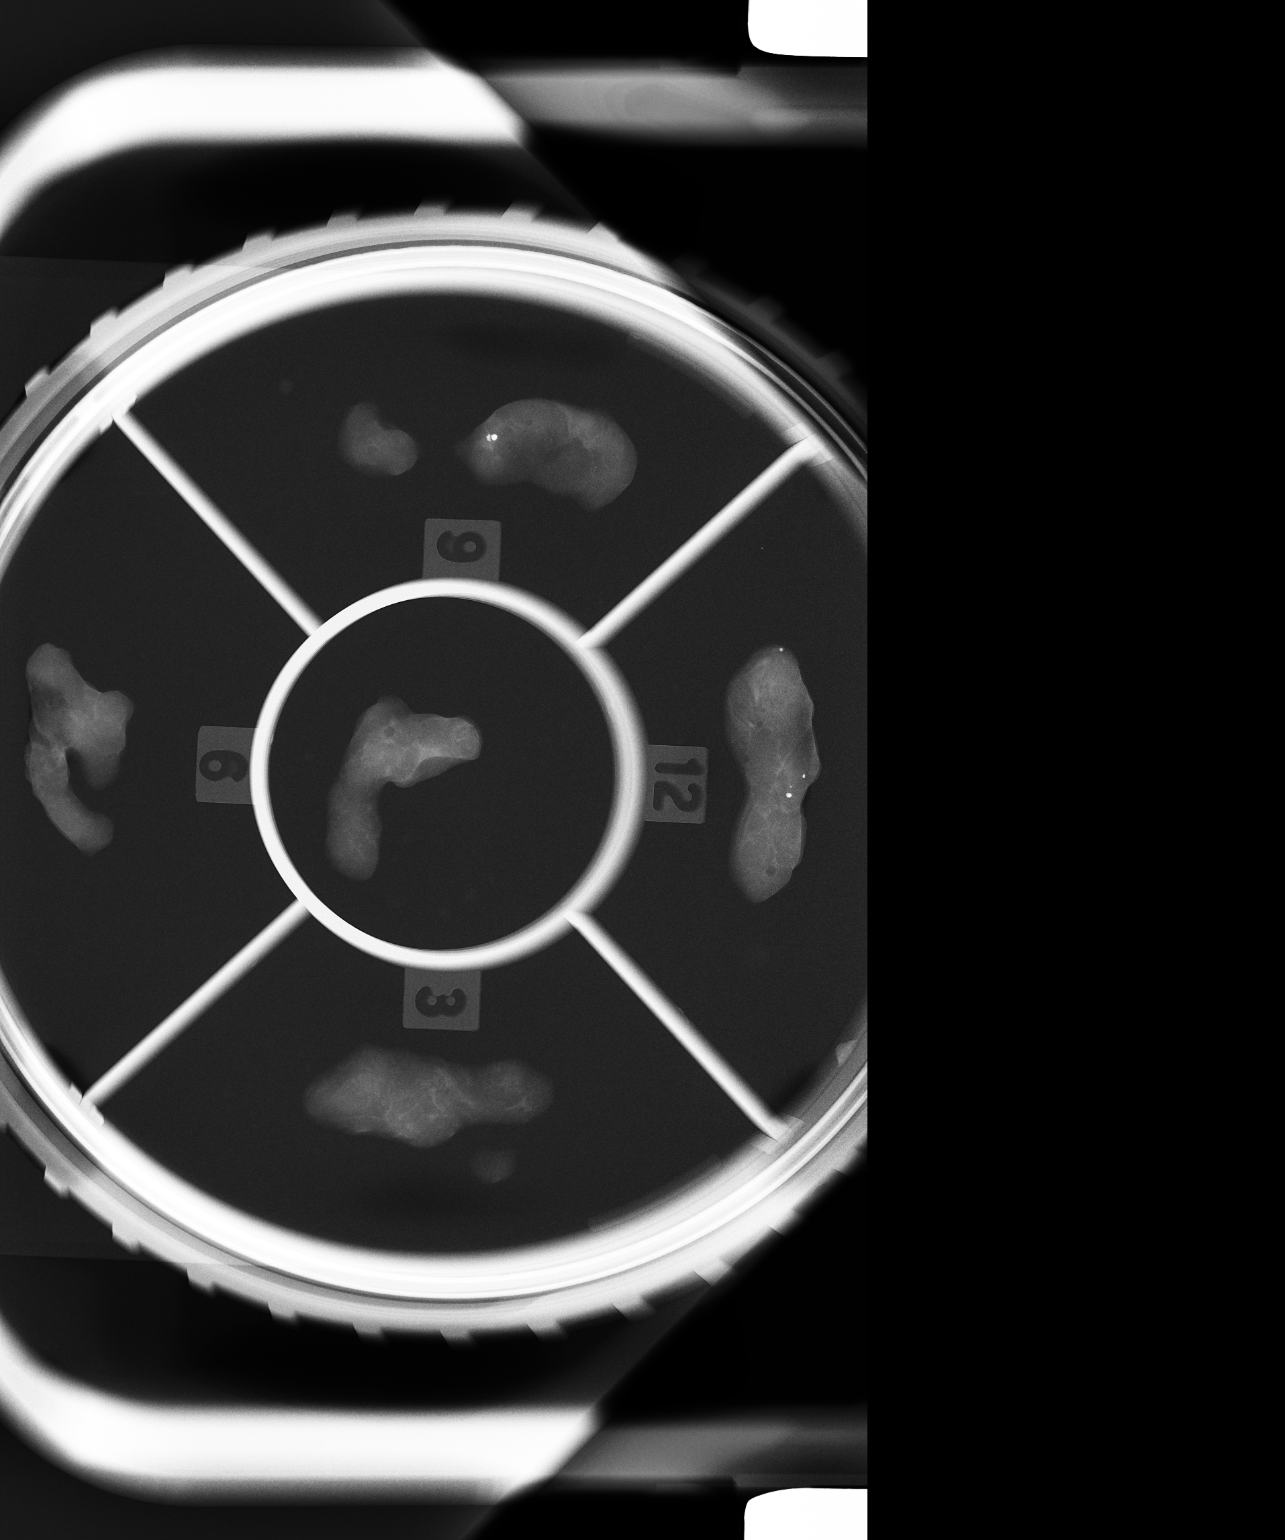

[1 of 1 positions shown; findings below may reference images not displayed]



Using sterile technique and 1% Lidocaine as local anesthetic, under
stereotactic guidance, a 9 gauge vacuum assisted device was used to
perform core needle biopsy of calcifications in the upper-inner
quadrant of the left breast using a medial to lateral approach.
Specimen radiograph was performed showing calcifications are present
in the tissue samples. Specimens with calcifications are identified
for pathology.

At the conclusion of the procedure, a top hat shaped tissue marker
clip was deployed into the biopsy cavity. Follow-up 2-view mammogram
was performed and dictated separately.
IMPRESSION: Stereotactic-guided biopsy of the left breast. No apparent
complications.

## 2015-11-25 IMAGING — MG MM BREAST BX W LOC DEV 1ST LESION IMAGE BX SPEC STEREO GUIDE*L*
1 series · 8 of 8 positions shown · non-contrast
Comparison: Previous exams.

ADDENDUM:
Pathology of the left breast biopsy revealed A. BREAST, LEFT, UPPER
INNER QUADRANT; STEREOTACTIC CORE BIOPSY: DYSTROPHIC CALCIFICATIONS
ASSOCIATED WITH AREAS CONSISTENT WITH OLD FAT NECROSIS. NEGATIVE FOR
ATYPIA AND MALIGNANCY. This was found to be concordant by Dr.
KLPIGBB.

Recommendations:  Return to screening mammography in one year.
At the patient's request, pathology and recommendations were relayed
to the patient by phone. She stated she has done well following the
biopsy with only some soreness but no bleeding, bruising, or
hematoma. Post biopsy instructions were reviewed with the patient.
All of her questions were answered. She was encouraged to call the
[REDACTED] with any
further questions or concerns.
Pathology and recommendations relayed by KLPIGBB on
[DATE].
CLINICAL DATA: Left breast calcifications.
EXAM:
LEFT BREAST STEREOTACTIC CORE NEEDLE BIOPSY

[Series 1: L · left · 0.10mm/px · 8 of 8 slices shown]
[im 1/8]
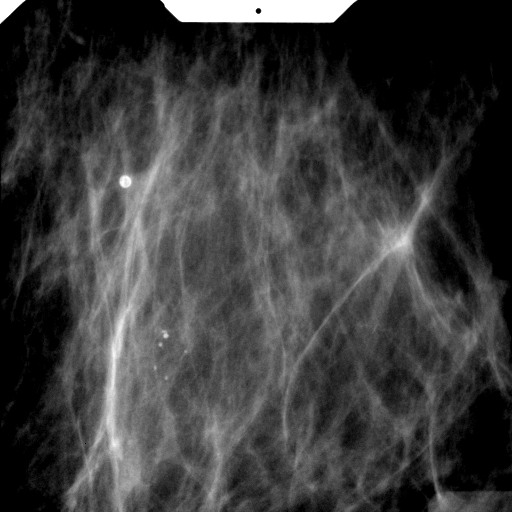
[im 2/8]
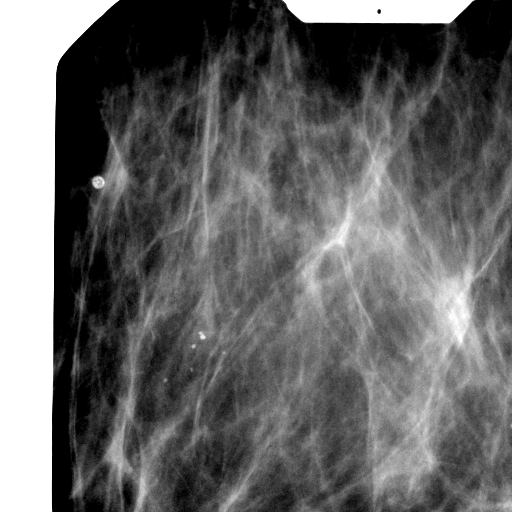
[im 3/8]
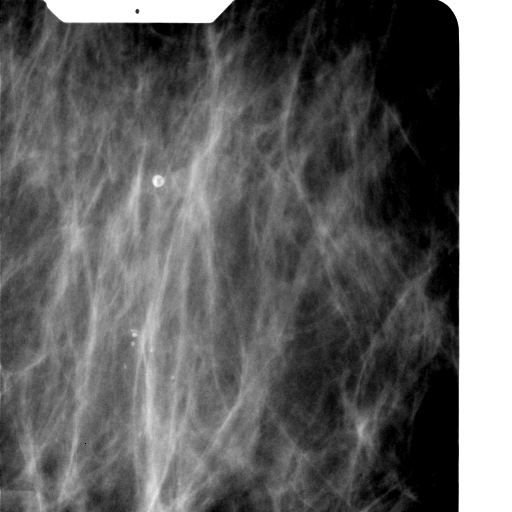
[im 4/8]
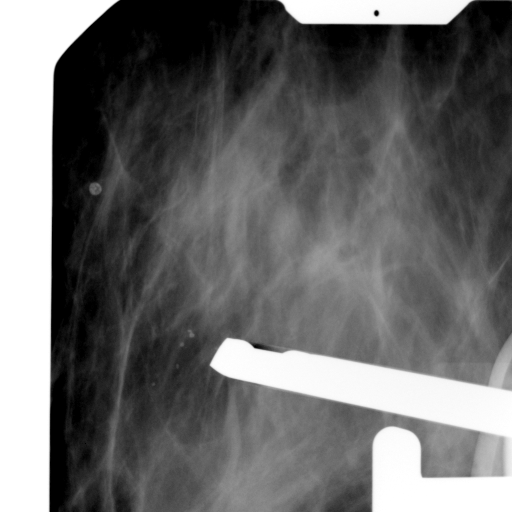
[im 5/8]
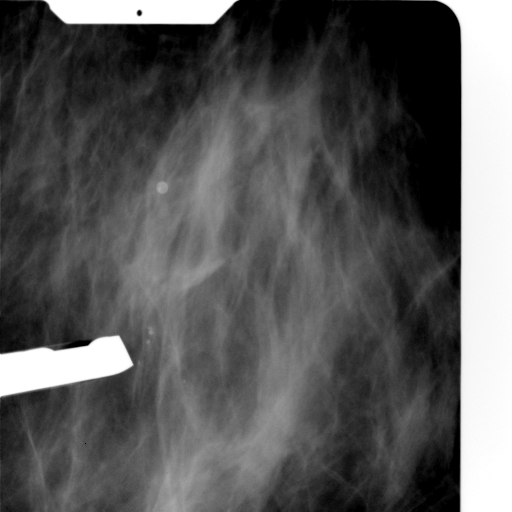
[im 6/8]
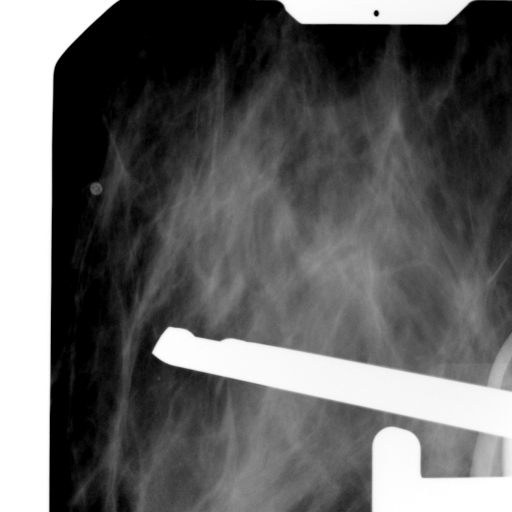
[im 7/8]
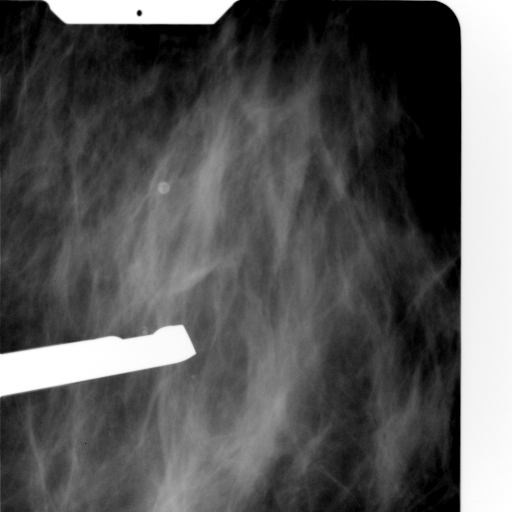
[im 8/8]
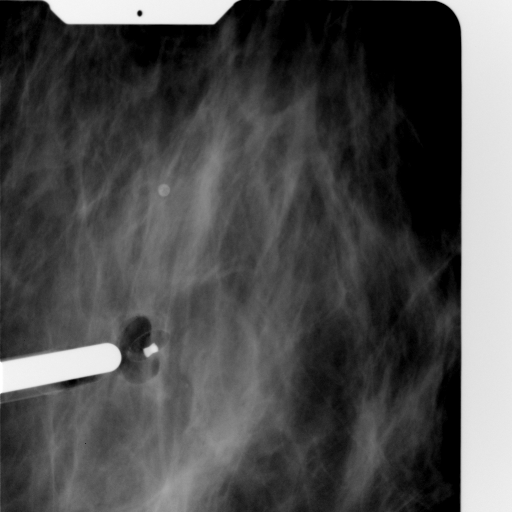

[8 of 8 positions shown; findings below may reference images not displayed]



Using sterile technique and 1% Lidocaine as local anesthetic, under
stereotactic guidance, a 9 gauge vacuum assisted device was used to
perform core needle biopsy of calcifications in the upper-inner
quadrant of the left breast using a medial to lateral approach.
Specimen radiograph was performed showing calcifications are present
in the tissue samples. Specimens with calcifications are identified
for pathology.

At the conclusion of the procedure, a top hat shaped tissue marker
clip was deployed into the biopsy cavity. Follow-up 2-view mammogram
was performed and dictated separately.
IMPRESSION: Stereotactic-guided biopsy of the left breast. No apparent
complications.

## 2015-11-26 LAB — SURGICAL PATHOLOGY

## 2016-01-13 ENCOUNTER — Other Ambulatory Visit: Payer: Self-pay | Admitting: Internal Medicine

## 2016-01-21 ENCOUNTER — Ambulatory Visit: Payer: Self-pay | Admitting: Internal Medicine

## 2016-01-27 ENCOUNTER — Other Ambulatory Visit: Payer: Self-pay | Admitting: Internal Medicine

## 2016-02-10 ENCOUNTER — Other Ambulatory Visit: Payer: Self-pay | Admitting: Internal Medicine

## 2016-02-11 ENCOUNTER — Ambulatory Visit (INDEPENDENT_AMBULATORY_CARE_PROVIDER_SITE_OTHER): Payer: Medicare Other | Admitting: Internal Medicine

## 2016-02-11 ENCOUNTER — Telehealth: Payer: Self-pay

## 2016-02-11 ENCOUNTER — Encounter: Payer: Self-pay | Admitting: Internal Medicine

## 2016-02-11 VITALS — BP 138/84 | HR 98 | Ht 66.0 in | Wt 243.0 lb

## 2016-02-11 DIAGNOSIS — E119 Type 2 diabetes mellitus without complications: Secondary | ICD-10-CM | POA: Diagnosis not present

## 2016-02-11 DIAGNOSIS — E039 Hypothyroidism, unspecified: Secondary | ICD-10-CM

## 2016-02-11 DIAGNOSIS — I10 Essential (primary) hypertension: Secondary | ICD-10-CM | POA: Diagnosis not present

## 2016-02-11 LAB — COMPREHENSIVE METABOLIC PANEL
ALBUMIN: 4.3 g/dL (ref 3.5–5.2)
ALK PHOS: 59 U/L (ref 39–117)
ALT: 20 U/L (ref 0–35)
AST: 22 U/L (ref 0–37)
BUN: 11 mg/dL (ref 6–23)
CHLORIDE: 101 meq/L (ref 96–112)
CO2: 29 mEq/L (ref 19–32)
Calcium: 9.8 mg/dL (ref 8.4–10.5)
Creatinine, Ser: 0.71 mg/dL (ref 0.40–1.20)
GFR: 104.56 mL/min (ref >60.00)
Glucose, Bld: 114 mg/dL — ABNORMAL HIGH (ref 70–99)
POTASSIUM: 3.6 meq/L (ref 3.5–5.1)
SODIUM: 138 meq/L (ref 135–145)
TOTAL PROTEIN: 7.1 g/dL (ref 6.0–8.3)
Total Bilirubin: 0.5 mg/dL (ref 0.2–1.2)

## 2016-02-11 LAB — TSH: TSH: 4.94 u[IU]/mL — ABNORMAL HIGH (ref 0.35–4.50)

## 2016-02-11 LAB — HEMOGLOBIN A1C: HEMOGLOBIN A1C: 6.5 % (ref 4.6–6.5)

## 2016-02-11 MED ORDER — MELOXICAM 15 MG PO TABS: 15.0000 mg | ORAL_TABLET | Freq: Every day | ORAL | Status: DC

## 2016-02-11 NOTE — Assessment & Plan Note (Signed)
BG well controlled by report. A1c with labs. Continue Metformin.

## 2016-02-11 NOTE — Telephone Encounter (Signed)
Left message for patient to call office regarding lab results and recommendations. 

## 2016-02-11 NOTE — Progress Notes (Signed)
Pre visit review using our clinic review tool, if applicable. No additional management support is needed unless otherwise documented below in the visit note. 

## 2016-02-11 NOTE — Assessment & Plan Note (Signed)
BP Readings from Last 3 Encounters:  02/11/16 138/84  10/21/15 139/85  09/24/15 144/91   BP well controlled. Renal function with labs. Continue current medication.

## 2016-02-11 NOTE — Telephone Encounter (Signed)
-----   Message from Jackolyn Confer, MD sent at 02/11/2016 12:54 PM EDT ----- Labs show stable excellent control of blood sugars. Thyroid levels are slightly low. Has she missed any doses of thyroid medication?

## 2016-02-11 NOTE — Progress Notes (Signed)
Subjective:    Patient ID: Cynthia [name], female    DOB: 03/17/1946, 70 y.o.   MRN: [id number]  HPI  70YO female presents for follow up.  DM - BG 108 this morning. Generally well controlled. Compliant with medication.  Feeling well. Sometimes notes increased joint pain with rainy weather. Improved with use of Meloxicam.  Wt Readings from Last 3 Encounters:  02/11/16 243 lb (110.224 kg)  10/21/15 243 lb 2 oz (110.281 kg)  09/24/15 240 lb 11.9 oz (109.2 kg)   BP Readings from Last 3 Encounters:  02/11/16 138/84  10/21/15 139/85  09/24/15 144/91    Past Medical History  Diagnosis Date  . Arthritis   . Hypertension   . Diabetes mellitus without complication ([place])   . Thyroid disease   . Hyperlipidemia   . Lung nodule    Family History  Problem Relation Age of Onset  . Hypertension Mother   . Hypertension Father   . Diabetes Sister    Past Surgical History  Procedure Laterality Date  . Vaginal delivery      4  . Thyroidectomy      Dr. [name]  . Breast excisional biopsy Left yrs ago    benign  . Breast biopsy Left 11/25/2015    stereo results pending   Social History   Social History  . Marital Status: Married    Spouse Name: N/A  . Number of Children: N/A  . Years of Education: N/A   Social History Main Topics  . Smoking status: Former Smoker -- 20 years    Types: Cigarettes    Quit date: 11/17/2012  . Smokeless tobacco: Current User  . Alcohol Use: No  . Drug Use: No  . Sexual Activity: No   Other Topics Concern  . None   Social History Narrative   Lives in [place] with husband. Has 4 children.      Work - Retired from [employer]: Negative for fever, chills, appetite change, fatigue and unexpected weight change.  Eyes: Negative for visual disturbance.  Respiratory: Negative for shortness of breath.   Cardiovascular: Negative for chest pain and leg swelling.  Gastrointestinal: Negative for nausea,  vomiting, abdominal pain, diarrhea and constipation.  Musculoskeletal: Positive for myalgias and arthralgias.  Skin: Negative for color change and rash.  Hematological: Negative for adenopathy. Does not bruise/bleed easily.  Psychiatric/Behavioral: Negative for sleep disturbance and dysphoric mood. The patient is not nervous/anxious.        Objective:    BP 138/84 mmHg  Pulse 98  Ht 5\' 6"  (1.676 m)  Wt 243 lb (110.224 kg)  BMI 39.24 kg/m2  SpO2 98% Physical Exam  Constitutional: She is oriented to person, place, and time. She appears well-developed and well-nourished. No distress.  HENT:  Head: Normocephalic and atraumatic.  Right Ear: External ear normal.  Left Ear: External ear normal.  Nose: Nose normal.  Mouth/Throat: Oropharynx is clear and moist. No oropharyngeal exudate.  Eyes: Conjunctivae are normal. Pupils are equal, round, and reactive to light. Right eye exhibits no discharge. Left eye exhibits no discharge. No scleral icterus.  Neck: Normal range of motion. Neck supple. No tracheal deviation present. No thyromegaly present.  Cardiovascular: Normal rate, regular rhythm, normal heart sounds and intact distal pulses.  Exam reveals no gallop and no friction rub.   No murmur heard. Pulmonary/Chest: Effort normal and breath sounds normal. No respiratory distress. She has no wheezes. She has no  rales. She exhibits no tenderness.  Musculoskeletal: Normal range of motion. She exhibits no edema or tenderness.  Lymphadenopathy:    She has no cervical adenopathy.  Neurological: She is alert and oriented to person, place, and time. No cranial nerve deficit. She exhibits normal muscle tone. Coordination normal.  Skin: Skin is warm and dry. No rash noted. She is not diaphoretic. No erythema. No pallor.  Psychiatric: She has a normal mood and affect. Her behavior is normal. Judgment and thought content normal.          Assessment & Plan:   Problem List Items Addressed This  Visit      Unprioritized   Diabetes mellitus, type 2 ([place]) - Primary    BG well controlled by report. A1c with labs. Continue Metformin.      Relevant Orders   Comprehensive metabolic panel   Hemoglobin A1c   Hypertension    BP Readings from Last 3 Encounters:  02/11/16 138/84  10/21/15 139/85  09/24/15 144/91   BP well controlled. Renal function with labs. Continue current medication.      Hypothyroidism    Will check TSH with labs. Continue Levothyroxine.      Relevant Orders   TSH       Return in about 3 months (around 05/13/2016) for Recheck of Diabetes.  Ronette Deter, MD [facility]

## 2016-02-11 NOTE — Patient Instructions (Signed)
Labs today.  Follow up in 3 months and sooner as needed. 

## 2016-02-11 NOTE — Assessment & Plan Note (Signed)
Will check TSH with labs. Continue Levothyroxine. 

## 2016-02-17 NOTE — Telephone Encounter (Signed)
Pt called back returning call for test results.   Call pt @ home before 12 and [phone] after 12. Thank you!

## 2016-02-17 NOTE — Telephone Encounter (Signed)
Patient states that she is taking her medication as directed.  Lab appointment made. Lab order placed.

## 2016-02-17 NOTE — Addendum Note (Signed)
Addended by: Westley Hummer B on: 02/17/2016 02:32 PM   Modules accepted: Orders

## 2016-02-17 NOTE — Telephone Encounter (Signed)
Please make sure she is taking Levothyroxine 159mcg daily with water only, no other medications. Then, let's repeat TSH level in 6 weeks.

## 2016-02-17 NOTE — Telephone Encounter (Signed)
Please advise for lab results, thanks

## 2016-02-17 NOTE — Telephone Encounter (Signed)
Patient is aware of her lab results and she has not missed any doses of her levothyroxine.

## 2016-03-21 ENCOUNTER — Other Ambulatory Visit (INDEPENDENT_AMBULATORY_CARE_PROVIDER_SITE_OTHER): Payer: Medicare Other

## 2016-03-21 DIAGNOSIS — E039 Hypothyroidism, unspecified: Secondary | ICD-10-CM | POA: Diagnosis not present

## 2016-03-21 LAB — TSH: TSH: 4.86 u[IU]/mL — AB (ref 0.35–4.50)

## 2016-03-23 MED ORDER — LEVOTHYROXINE SODIUM 150 MCG PO TABS: 150.0000 ug | ORAL_TABLET | Freq: Every day | ORAL | Status: DC

## 2016-03-23 NOTE — Addendum Note (Signed)
Addended by: Elpidio Galea T on: 03/23/2016 10:42 AM   Modules accepted: Orders

## 2016-03-25 ENCOUNTER — Other Ambulatory Visit: Payer: Self-pay | Admitting: Internal Medicine

## 2016-03-31 ENCOUNTER — Other Ambulatory Visit: Payer: Self-pay | Admitting: Internal Medicine

## 2016-04-04 ENCOUNTER — Ambulatory Visit: Payer: Self-pay

## 2016-04-07 ENCOUNTER — Ambulatory Visit (INDEPENDENT_AMBULATORY_CARE_PROVIDER_SITE_OTHER): Payer: Medicare Other

## 2016-04-07 VITALS — BP 128/72 | HR 96 | Temp 97.7°F | Resp 14 | Ht 66.0 in | Wt 243.8 lb

## 2016-04-07 DIAGNOSIS — Z0000 Encounter for general adult medical examination without abnormal findings: Secondary | ICD-10-CM

## 2016-04-07 DIAGNOSIS — E039 Hypothyroidism, unspecified: Secondary | ICD-10-CM

## 2016-04-07 DIAGNOSIS — E2839 Other primary ovarian failure: Secondary | ICD-10-CM

## 2016-04-07 NOTE — Progress Notes (Signed)
Annual Wellness Visit as completed by [facility] Coach was reviewed in full.  

## 2016-04-07 NOTE — Patient Instructions (Addendum)
  Cynthia [name] , Thank you for taking time to come for your Medicare Wellness Visit. I appreciate your ongoing commitment to your health goals. Please review the following plan we discussed and let me know if I can assist you in the future.   FOLLOW UP WITH Cynthia ARNETT NP, AS YOUR NEW PROVIDER.  FOLLOW UP WITH INSURANCE REGARDING ZOSTAVAX (SHINGLES VACCINE) AND TETANUS (TDAP) VACCINE.   This is a list of the screening recommended for you and due dates:  Health Maintenance  Topic Date Due  .  Hepatitis C: One time screening is recommended by Center for Disease Control  (CDC) for  adults born from [birth year] through 1965.   08/04/1946  . Tetanus Vaccine  10/07/1964  . Shingles Vaccine  10/07/2005  . DEXA scan (bone density measurement)  10/07/2010  . Complete foot exam   04/01/2016  . Flu Shot  04/19/2016  . Eye exam for diabetics  05/28/2016  . Hemoglobin A1C  08/13/2016  . Colon Cancer Screening  10/18/2017  . Mammogram  11/24/2017  . Pneumonia vaccines  Completed    Bone Densitometry Bone densitometry is an imaging test that uses a special X-ray to measure the amount of calcium and other minerals in your bones (bone density). This test is also known as a bone mineral density test or dual-energy X-ray absorptiometry (DXA). The test can measure bone density at your hip and your spine. It is similar to having a regular X-ray. You may have this test to:  Diagnose a condition that causes weak or thin bones (osteoporosis).  Predict your risk of a broken bone (fracture).  Determine how well osteoporosis treatment is working. LET [facility] CARE PROVIDER KNOW ABOUT:  Any allergies you have.  All medicines you are taking, including vitamins, herbs, eye drops, creams, and over-the-counter medicines.  Previous problems you or members of your family have had with the use of anesthetics.  Any blood disorders you have.  Previous surgeries you have had.  Medical conditions you  have.  Possibility of pregnancy.  Any other medical test you had within the previous 14 days that used contrast material. RISKS AND COMPLICATIONS Generally, this is a safe procedure. However, problems can occur and may include the following:  This test exposes you to a very small amount of radiation.  The risks of radiation exposure may be greater to unborn children. BEFORE THE PROCEDURE  Do not take any calcium supplements for 24 hours before having the test. You can otherwise eat and drink what you usually do.  Take off all metal jewelry, eyeglasses, dental appliances, and any other metal objects. PROCEDURE  You may lie on an exam table. There will be an X-ray generator below you and an imaging device above you.  Other devices, such as boxes or braces, may be used to position your body properly for the scan.  You will need to lie still while the machine slowly scans your body.  The images will show up on a computer monitor. AFTER THE PROCEDURE You may need more testing at a later time.   This information is not intended to replace advice given to you by your health care provider. Make sure you discuss any questions you have with your health care provider.   Document Released: 09/27/2004 Document Revised: 09/26/2014 Document Reviewed: 02/13/2014 Elsevier Interactive Patient Education [employer].

## 2016-04-07 NOTE — Progress Notes (Signed)
Subjective:   Cynthia [name] is a 70 y.o. female who presents for Medicare Annual (Subsequent) preventive examination.  Review of Systems:  No ROS.  Medicare Wellness Visit.  Cardiac Risk Factors include: advanced age (>50men, >67 women);diabetes mellitus;hypertension     Objective:     Vitals: BP 128/72 mmHg  Pulse 96  Temp(Src) 97.7 F (36.5 C) (Oral)  Resp 14  Ht 5\' 6"  (1.676 m)  Wt 243 lb 12.8 oz (110.587 kg)  BMI 39.37 kg/m2  SpO2 97%  Body mass index is 39.37 kg/(m^2).   Tobacco History  Smoking status  . Former Smoker -- 20 years  . Types: Cigarettes  . Quit date: 11/17/2012  Smokeless tobacco  . Current User     Ready to quit: Not Answered Counseling given: Not Answered   Past Medical History  Diagnosis Date  . Arthritis   . Hypertension   . Diabetes mellitus without complication ([place])   . Thyroid disease   . Hyperlipidemia   . Lung nodule    Past Surgical History  Procedure Laterality Date  . Vaginal delivery      4  . Thyroidectomy      Dr. [name]  . Breast excisional biopsy Left yrs ago    benign  . Breast biopsy Left 11/25/2015    stereo results pending   Family History  Problem Relation Age of Onset  . Hypertension Mother   . Hypertension Father   . Diabetes Sister    History  Sexual Activity  . Sexual Activity: No    Outpatient Encounter Prescriptions as of 04/07/2016  Medication Sig  . amLODipine (NORVASC) 10 MG tablet TAKE 1 TABLET (10 MG TOTAL) BY MOUTH DAILY.  Marland Kitchen aspirin 81 MG tablet Take 81 mg by mouth daily.  Marland Kitchen atorvastatin (LIPITOR) 20 MG tablet TAKE 1 TABLET (20 MG TOTAL) BY MOUTH DAILY.  . Calcium Carbonate-Vitamin D 600-400 MG-UNIT per tablet Take 1 tablet by mouth daily.  Marland Kitchen glucose blood test strip Use as instructed  . Lancets MISC Checks blood glucose twice daily. Diagnosis Code 250.  Marland Kitchen levothyroxine (SYNTHROID, LEVOTHROID) 150 MCG tablet Take 1 tablet (150 mcg total) by mouth daily.  Marland Kitchen  losartan-hydrochlorothiazide (HYZAAR) 50-12.5 MG tablet TAKE 1 TABLET BY MOUTH DAILY.  . meloxicam (MOBIC) 15 MG tablet Take 1 tablet (15 mg total) by mouth daily.  . metFORMIN (GLUCOPHAGE) 500 MG tablet TAKE 1 TABLET (500 MG TOTAL) BY MOUTH 2 (TWO) TIMES DAILY WITH A MEAL.   No facility-administered encounter medications on file as of 04/07/2016.    Activities of Daily Living In your present state of health, do you have any difficulty performing the following activities: 04/07/2016 02/11/2016  Hearing? N N  Vision? N N  Difficulty concentrating or making decisions? N N  Walking or climbing stairs? N N  Dressing or bathing? N N  Doing errands, shopping? N N  Preparing Food and eating ? N -  Using the Toilet? N -  In the past six months, have you accidently leaked urine? N -  Do you have problems with loss of bowel control? N -  Managing your Medications? N -  Managing your Finances? N -  Housekeeping or managing your Housekeeping? N -    Patient Care Team: Jackolyn Confer, MD as PCP - General (Internal Medicine) Jackolyn Confer, MD (Internal Medicine)    Assessment:   This is a routine wellness examination for [place]. The goal of the wellness visit is to  assist the patient how to close the gaps in care and create a preventative care plan for the patient.   Taking calcium VIT D as appropriate/Osteoporosis risk reviewed.  DEXA Scan order placed.    Medications reviewed; taking without issues or barriers.  Safety issues reviewed; smoke and carbon monoxide detectors in the home. No firearms in the home. Wears seatbelts when driving or riding with others. No violence in the home.  No identified risk were noted; The patient was oriented x 3; appropriate in dress and manner and no objective failures at ADL's or IADL's.   Body mass index- discussed the importance of a healthy diet, water intake and exercise. Educational material provided.  There is no risks for hepatitis,  STDs or HIV. There is no history of blood transfusion. They have no travel history to infectious disease endemic areas of the world.  Hepatitis C screening deferred.  Educational material provided.    Patient Concerns: None at this time. Follow up with PCP as needed.  Exercise Activities and Dietary recommendations Current Exercise Habits: Home exercise routine, Type of exercise: walking;stretching, Intensity: Mild  Goals    . Healthy Lifestyle     STAY HYDRATED AND DRINK PLENTY OF FLUIDS.  INCREASE WATER INTAKE. LOW CARB FOODS.  LEAN MEATS (CHICKEN, [place], FISH).  VEGETABLES, FRUITS. STRETCH AND STAY ACTIVE BY CONTINUING TO WALK WITH SISTER WHEN THE WEATHER PERMITS.  3 DAYS A WEEK FOR 30 MINUTES.        Fall Risk Fall Risk  04/07/2016 02/11/2016 04/02/2015 03/07/2014 03/08/2013  Falls in the past year? No No No No No   Depression Screen PHQ 2/9 Scores 04/07/2016 02/11/2016 10/21/2015 04/02/2015  PHQ - 2 Score 0 0 0 0     Cognitive Testing MMSE - Mini Mental State Exam 04/07/2016 04/02/2015  Orientation to time 5 5  Orientation to Place 5 5  Registration 3 3  Attention/ Calculation 5 5  Recall 3 3  Language- name 2 objects 2 2  Language- repeat 1 1  Language- follow 3 step command 3 3  Language- read & follow direction 1 1  Write a sentence 1 1  Copy design 1 1  Total score 30 30    Immunization History  Administered Date(s) Administered  . Influenza,inj,Quad PF,36+ Mos 06/13/2013, 06/13/2014, 07/03/2015  . Influenza-Unspecified 07/18/2012  . Pneumococcal Conjugate-13 03/07/2014  . Pneumococcal Polysaccharide-23 03/08/2013   Screening Tests Health Maintenance  Topic Date Due  . Hepatitis C Screening  12/30/1945  . TETANUS/TDAP  10/07/1964  . ZOSTAVAX  10/07/2005  . DEXA SCAN  10/07/2010  . FOOT EXAM  04/01/2016  . INFLUENZA VACCINE  04/19/2016  . OPHTHALMOLOGY EXAM  05/28/2016  . HEMOGLOBIN A1C  08/13/2016  . COLONOSCOPY  10/18/2017  . MAMMOGRAM  11/24/2017  . PNA  vac Low Risk Adult  Completed      Plan:   End of life planning; Advance aging; Advanced directives discussed.  HCPOA/Living Will deferred at this time.  During the course of the visit the patient was educated and counseled about the following appropriate screening and preventive services:   Vaccines to include Pneumoccal, Influenza, Hepatitis B, Td, Zostavax, HCV  Electrocardiogram  Cardiovascular Disease  Colorectal cancer screening  Bone density screening  Diabetes screening  Glaucoma screening  Mammography/PAP  Nutrition counseling   Patient Instructions (the written plan) was given to the patient.   Varney Biles, LPN  [id number]

## 2016-04-11 ENCOUNTER — Other Ambulatory Visit: Payer: Self-pay | Admitting: Internal Medicine

## 2016-05-04 ENCOUNTER — Other Ambulatory Visit (INDEPENDENT_AMBULATORY_CARE_PROVIDER_SITE_OTHER): Payer: Medicare Other

## 2016-05-04 DIAGNOSIS — E039 Hypothyroidism, unspecified: Secondary | ICD-10-CM

## 2016-05-04 LAB — TSH: TSH: 0.17 u[IU]/mL — AB (ref 0.35–4.50)

## 2016-05-11 ENCOUNTER — Encounter: Payer: Self-pay | Admitting: Family

## 2016-05-11 ENCOUNTER — Ambulatory Visit (INDEPENDENT_AMBULATORY_CARE_PROVIDER_SITE_OTHER): Payer: Medicare Other | Admitting: Family

## 2016-05-11 VITALS — BP 122/80 | HR 85 | Temp 98.0°F | Resp 16 | Wt 242.0 lb

## 2016-05-11 DIAGNOSIS — Z23 Encounter for immunization: Secondary | ICD-10-CM | POA: Diagnosis not present

## 2016-05-11 DIAGNOSIS — R002 Palpitations: Secondary | ICD-10-CM | POA: Diagnosis not present

## 2016-05-11 DIAGNOSIS — E039 Hypothyroidism, unspecified: Secondary | ICD-10-CM

## 2016-05-11 DIAGNOSIS — E119 Type 2 diabetes mellitus without complications: Secondary | ICD-10-CM

## 2016-05-11 LAB — HEPATITIS C ANTIBODY: HCV AB: NEGATIVE

## 2016-05-11 LAB — HEMOGLOBIN A1C: HEMOGLOBIN A1C: 6.4 % (ref 4.6–6.5)

## 2016-05-11 LAB — TSH: TSH: 0.14 u[IU]/mL — AB (ref 0.35–4.50)

## 2016-05-11 NOTE — Patient Instructions (Signed)
Pleasure meeting you.   Palpitations A palpitation is the feeling that your heartbeat is irregular or is faster than normal. It may feel like your heart is fluttering or skipping a beat. Palpitations are usually not a serious problem. However, in some cases, you may need further medical evaluation. CAUSES  Palpitations can be caused by:  Smoking.  Caffeine or other stimulants, such as diet pills or energy drinks.  Alcohol.  Stress and anxiety.  Strenuous physical activity.  Fatigue.  Certain medicines.  Heart disease, especially if you have a history of irregular heart rhythms (arrhythmias), such as atrial fibrillation, atrial flutter, or supraventricular tachycardia.  An improperly working pacemaker or defibrillator. DIAGNOSIS  To find the cause of your palpitations, your health care provider will take your medical history and perform a physical exam. Your health care provider may also have you take a test called an ambulatory electrocardiogram (ECG). An ECG records your heartbeat patterns over a 24-hour period. You may also have other tests, such as:  Transthoracic echocardiogram (TTE). During echocardiography, sound waves are used to evaluate how blood flows through your heart.  Transesophageal echocardiogram (TEE).  Cardiac monitoring. This allows your health care provider to monitor your heart rate and rhythm in real time.  Holter monitor. This is a portable device that records your heartbeat and can help diagnose heart arrhythmias. It allows your health care provider to track your heart activity for several days, if needed.  Stress tests by exercise or by giving medicine that makes the heart beat faster. TREATMENT  Treatment of palpitations depends on the cause of your symptoms and can vary greatly. Most cases of palpitations do not require any treatment other than time, relaxation, and monitoring your symptoms. Other causes, such as atrial fibrillation, atrial flutter, or  supraventricular tachycardia, usually require further treatment. HOME CARE INSTRUCTIONS   Avoid:  Caffeinated coffee, tea, soft drinks, diet pills, and energy drinks.  Chocolate.  Alcohol.  Stop smoking if you smoke.  Reduce your stress and anxiety. Things that can help you relax include:  A method of controlling things in your body, such as your heartbeats, with your mind (biofeedback).  Yoga.  Meditation.  Physical activity such as swimming, jogging, or walking.  Get plenty of rest and sleep. SEEK MEDICAL CARE IF:   You continue to have a fast or irregular heartbeat beyond 24 hours.  Your palpitations occur more often. SEEK IMMEDIATE MEDICAL CARE IF:  You have chest pain or shortness of breath.  You have a severe headache.  You feel dizzy or you faint. MAKE SURE YOU:  Understand these instructions.  Will watch your condition.  Will get help right away if you are not doing well or get worse.   This information is not intended to replace advice given to you by your health care provider. Make sure you discuss any questions you have with your health care provider.   Document Released: 09/02/2000 Document Revised: 09/10/2013 Document Reviewed: 11/04/2011 Elsevier Interactive Patient Education [employer].

## 2016-05-11 NOTE — Assessment & Plan Note (Signed)
Controlled. Checking A1c. Continue metformin.

## 2016-05-11 NOTE — Assessment & Plan Note (Signed)
Patient is not experiencing any symptoms of coronary syndrome. I'm reassured by normal EKG. I suspect that her thyroid may be contributing to elevated heart rate when she initially came in to our office. Pending TSH.

## 2016-05-11 NOTE — Progress Notes (Signed)
Subjective:    Patient ID: Cynthia [name], female    DOB: 1946/01/20, 70 y.o.   MRN: [id number]  CC: Cynthia [name] is a 70 y.o. female who presents today for follow up.   HPI: Patient here for follow-up on diabetes.  Diabetes: Fasting BS 102-120's. On metformin, tolerating well. Due for eye exam and will make appointment.    Elevated HR: 'Doesn't feel it.' Denies exertional chest pain or pressure, numbness or tingling radiating to left arm or jaw, palpitations, dizziness, frequent headaches, changes in vision, or shortness of breath. H/o hypothyroidism.  Patient  due for tetanus and shingles vaccine. She is also due for DEXA scan. Patient politely declined these additional screening tests today and will do at follow-up.     Negative biopsy of left breast 11/2015. No malignancy seen in right breast. UTD on mammogram until 2018.  HISTORY:  Past Medical History:  Diagnosis Date  . Arthritis   . Diabetes mellitus without complication ([place])   . Hyperlipidemia   . Hypertension   . Lung nodule   . Thyroid disease    Past Surgical History:  Procedure Laterality Date  . BREAST BIOPSY Left 11/25/2015   stereo results pending  . BREAST EXCISIONAL BIOPSY Left yrs ago   benign  . THYROIDECTOMY     Dr. [name]  . VAGINAL DELIVERY     4   Family History  Problem Relation Age of Onset  . Hypertension Mother   . Hypertension Father   . Diabetes Sister     Allergies: Review of patient's allergies indicates no known allergies. Current Outpatient Prescriptions on File Prior to Visit  Medication Sig Dispense Refill  . amLODipine (NORVASC) 10 MG tablet TAKE 1 TABLET (10 MG TOTAL) BY MOUTH DAILY. 90 tablet 3  . aspirin 81 MG tablet Take 81 mg by mouth daily.    Marland Kitchen atorvastatin (LIPITOR) 20 MG tablet TAKE 1 TABLET (20 MG TOTAL) BY MOUTH DAILY. 90 tablet 1  . Calcium Carbonate-Vitamin D 600-400 MG-UNIT per tablet Take 1 tablet by mouth daily.    Marland Kitchen glucose blood test strip Use as  instructed 100 each 12  . Lancets MISC Checks blood glucose twice daily. Diagnosis Code 250. 100 each 6  . levothyroxine (SYNTHROID, LEVOTHROID) 150 MCG tablet Take 1 tablet (150 mcg total) by mouth daily. 90 tablet 3  . losartan-hydrochlorothiazide (HYZAAR) 50-12.5 MG tablet TAKE 1 TABLET BY MOUTH DAILY. 90 tablet 1  . meloxicam (MOBIC) 15 MG tablet Take 1 tablet (15 mg total) by mouth daily. 90 tablet 0  . metFORMIN (GLUCOPHAGE) 500 MG tablet TAKE 1 TABLET (500 MG TOTAL) BY MOUTH 2 (TWO) TIMES DAILY WITH A MEAL. 180 tablet 1   No current facility-administered medications on file prior to visit.     Social History  Substance Use Topics  . Smoking status: Former Smoker    Years: 20.00    Types: Cigarettes    Quit date: 11/17/2012  . Smokeless tobacco: Former Systems developer  . Alcohol use No    Review of Systems  Constitutional: Negative for chills, fever and unexpected weight change.  Respiratory: Negative for cough.   Cardiovascular: Negative for chest pain and palpitations.  Gastrointestinal: Negative for nausea and vomiting.  Endocrine: Negative for cold intolerance and heat intolerance.      Objective:    BP 122/80 (BP Location: Left Arm, Patient Position: Sitting, Cuff Size: Large)   Pulse 85   Temp 98 F (36.7 C) (Oral)  Resp 16   Wt 242 lb (109.8 kg)   SpO2 97%   BMI 39.06 kg/m  BP Readings from Last 3 Encounters:  05/11/16 122/80  04/07/16 128/72  02/11/16 138/84   Wt Readings from Last 3 Encounters:  05/11/16 242 lb (109.8 kg)  04/07/16 243 lb 12.8 oz (110.6 kg)  02/11/16 243 lb (110.2 kg)    Physical Exam  Constitutional: She appears well-developed and well-nourished.  Eyes: Conjunctivae are normal.  Cardiovascular: Normal rate, regular rhythm, normal heart sounds and normal pulses.   Pulmonary/Chest: Effort normal and breath sounds normal. She has no wheezes. She has no rhonchi. She has no rales.  Neurological: She is alert.  Skin: Skin is warm and dry.    Psychiatric: She has a normal mood and affect. Her speech is normal and behavior is normal. Thought content normal.  Vitals reviewed.      Assessment & Plan:   Problem List Items Addressed This Visit      Endocrine   Diabetes mellitus, type 2 ([place]) - Primary    Controlled. Checking A1c. Continue metformin.      Relevant Orders   Hepatitis C antibody   Hemoglobin A1c   Hypothyroidism    Patient is not experiencing any symptoms of coronary syndrome. I'm reassured by normal EKG. I suspect that her thyroid may be contributing to elevated heart rate when she initially came in to our office. Pending TSH.      Relevant Orders   TSH    Other Visit Diagnoses    Need for influenza vaccination       Relevant Orders   Flu Vaccine QUAD 36+ mos IM   Encounter for immunization       Relevant Orders   Flu Vaccine QUAD 36+ mos IM   Flu Vaccine QUAD 36+ mos IM (Completed)   Hepatitis C antibody   TSH   Hemoglobin A1c   Palpitations       Relevant Orders   EKG 12-Lead (Completed)       I am having Cynthia [name] maintain her Calcium Carbonate-Vitamin D, aspirin, glucose blood, Lancets, metFORMIN, atorvastatin, meloxicam, levothyroxine, amLODipine, and losartan-hydrochlorothiazide.   No orders of the defined types were placed in this encounter.   Return precautions given.   Risks, benefits, and alternatives of the medications and treatment plan prescribed today were discussed, and patient expressed understanding.   Education regarding symptom management and diagnosis given to patient on AVS.  Continue to follow with BYRD, Lorelee Market, NP for routine health maintenance.   Cynthia [name] and I agreed with plan.   Mable Paris, FNP

## 2016-05-12 ENCOUNTER — Telehealth: Payer: Self-pay

## 2016-05-12 ENCOUNTER — Other Ambulatory Visit: Payer: Self-pay | Admitting: Family

## 2016-05-12 DIAGNOSIS — E039 Hypothyroidism, unspecified: Secondary | ICD-10-CM

## 2016-05-12 MED ORDER — LEVOTHYROXINE SODIUM 137 MCG PO TABS: 137.0000 ug | ORAL_TABLET | Freq: Every day | ORAL | 2 refills | Status: DC

## 2016-05-12 NOTE — Telephone Encounter (Signed)
-----   Message from Burnard Hawthorne, [place] sent at 05/12/2016 10:38 AM EDT ----- Please let patient know that recent tests were ABNORMAL with the following advice;  Her TSH is low- I would like for her go back to start synthroid 137 mcg. She is currently on 150 mcg. I sent in new Rx. She can come back in 6 weeks for lab appt to have TSH redrawn. Please make her an appt.   Negative hep C.  She is prediabetic at the same level she was 10 months ago. Please encourage exercise and limiting sugary-carb foods to that we don't have to start a diabetic medication.    If unable to reach patient after 3 attempts, please let me know.   Thanks as always!  margaret

## 2016-05-12 NOTE — Telephone Encounter (Signed)
LMTCB. sd  

## 2016-05-12 NOTE — Progress Notes (Signed)
See result note.  

## 2016-05-17 ENCOUNTER — Other Ambulatory Visit: Payer: Self-pay

## 2016-05-17 ENCOUNTER — Ambulatory Visit (INDEPENDENT_AMBULATORY_CARE_PROVIDER_SITE_OTHER): Payer: Medicare Other | Admitting: *Deleted

## 2016-05-17 DIAGNOSIS — Z23 Encounter for immunization: Secondary | ICD-10-CM

## 2016-05-18 ENCOUNTER — Ambulatory Visit: Payer: Self-pay | Admitting: Internal Medicine

## 2016-05-19 ENCOUNTER — Other Ambulatory Visit: Payer: Self-pay | Admitting: Family

## 2016-05-25 NOTE — Telephone Encounter (Signed)
Pt called about her Rx which is not at the pharmacy. Pt states after today she will be out.  Please advise?

## 2016-06-08 LAB — HM DIABETES EYE EXAM

## 2016-06-23 ENCOUNTER — Ambulatory Visit (INDEPENDENT_AMBULATORY_CARE_PROVIDER_SITE_OTHER): Payer: Medicare Other | Admitting: Family

## 2016-06-23 ENCOUNTER — Encounter: Payer: Self-pay | Admitting: Family

## 2016-06-23 VITALS — BP 130/70 | HR 110 | Temp 98.2°F | Ht 66.0 in | Wt 241.2 lb

## 2016-06-23 DIAGNOSIS — M1711 Unilateral primary osteoarthritis, right knee: Secondary | ICD-10-CM

## 2016-06-23 DIAGNOSIS — E039 Hypothyroidism, unspecified: Secondary | ICD-10-CM | POA: Diagnosis not present

## 2016-06-23 DIAGNOSIS — I10 Essential (primary) hypertension: Secondary | ICD-10-CM

## 2016-06-23 DIAGNOSIS — E119 Type 2 diabetes mellitus without complications: Secondary | ICD-10-CM | POA: Diagnosis not present

## 2016-06-23 LAB — TSH: TSH: 0.54 u[IU]/mL (ref 0.35–4.50)

## 2016-06-23 MED ORDER — LEVOTHYROXINE SODIUM 137 MCG PO TABS: 137.0000 ug | ORAL_TABLET | Freq: Every day | ORAL | 1 refills | Status: DC

## 2016-06-23 MED ORDER — ZOSTER VACCINE LIVE 19400 UNT/0.65ML ~~LOC~~ SUSR: 0.6500 mL | Freq: Once | SUBCUTANEOUS | 0 refills | Status: AC

## 2016-06-23 MED ORDER — OMEPRAZOLE 20 MG PO CPDR: 20.0000 mg | DELAYED_RELEASE_CAPSULE | Freq: Every day | ORAL | 1 refills | Status: DC

## 2016-06-23 NOTE — Assessment & Plan Note (Signed)
At goal. Continue current regimen. 

## 2016-06-23 NOTE — Assessment & Plan Note (Addendum)
Patient tolerating medication well. Pending TSH to evaluate for euthyroid.

## 2016-06-23 NOTE — Progress Notes (Signed)
Left message for patient to return call back. Need a BP and HR reading at home.

## 2016-06-23 NOTE — Assessment & Plan Note (Signed)
At goal based on blood sugars reported by patient. Will check A1c at next visit.

## 2016-06-23 NOTE — Patient Instructions (Addendum)
Pleasure seeing you   

## 2016-06-23 NOTE — Progress Notes (Signed)
Pre visit review using our clinic review tool, if applicable. No additional management support is needed unless otherwise documented below in the visit note. 

## 2016-06-23 NOTE — Progress Notes (Signed)
Subjective:    Patient ID: Cynthia [name], female    DOB: 1945/10/22, 71 y.o.   MRN: [id number]  CC: Cynthia [name] is a 70 y.o. female who presents today for follow up.   HPI: Patient here for follow up. She is feeling well and has no complaints. At last visit we decreased her levothyroxine. Since then she's having no palpitations or cold/heat intolerance.  HTN- Taking medication. No chest pain, SOB.   DM- Taking medication. BS at home 'staying around 100.' No low blood sugars.     HISTORY:  Past Medical History:  Diagnosis Date  . Arthritis   . Diabetes mellitus without complication ([place])   . Hyperlipidemia   . Hypertension   . Lung nodule   . Thyroid disease    Past Surgical History:  Procedure Laterality Date  . BREAST BIOPSY Left 11/25/2015   stereo results pending  . BREAST EXCISIONAL BIOPSY Left yrs ago   benign  . THYROIDECTOMY     Dr. [name]  . VAGINAL DELIVERY     4   Family History  Problem Relation Age of Onset  . Hypertension Mother   . Hypertension Father   . Diabetes Sister     Allergies: Review of patient's allergies indicates no known allergies. Current Outpatient Prescriptions on File Prior to Visit  Medication Sig Dispense Refill  . amLODipine (NORVASC) 10 MG tablet TAKE 1 TABLET (10 MG TOTAL) BY MOUTH DAILY. 90 tablet 3  . aspirin 81 MG tablet Take 81 mg by mouth daily.    Marland Kitchen atorvastatin (LIPITOR) 20 MG tablet TAKE 1 TABLET (20 MG TOTAL) BY MOUTH DAILY. 90 tablet 1  . Calcium Carbonate-Vitamin D 600-400 MG-UNIT per tablet Take 1 tablet by mouth daily.    Marland Kitchen losartan-hydrochlorothiazide (HYZAAR) 50-12.5 MG tablet TAKE 1 TABLET BY MOUTH DAILY. 90 tablet 1  . meloxicam (MOBIC) 15 MG tablet Take 1 tablet (15 mg total) by mouth daily. 90 tablet 0  . metFORMIN (GLUCOPHAGE) 500 MG tablet TAKE 1 TABLET (500 MG TOTAL) BY MOUTH 2 (TWO) TIMES DAILY WITH A MEAL. 180 tablet 1  . ONE TOUCH ULTRA TEST test strip TEST BLOOD SUGAR 1-2 TIMES A DAY 100 each 4   . ONETOUCH DELICA LANCETS FINE MISC CHECK BLOOD GLUCOSE TWICE A DAY 100 each 3   No current facility-administered medications on file prior to visit.     Social History  Substance Use Topics  . Smoking status: Former Smoker    Years: 20.00    Types: Cigarettes    Quit date: 11/17/2012  . Smokeless tobacco: Former Systems developer  . Alcohol use No    Review of Systems  Constitutional: Negative for chills and fever.  Respiratory: Negative for cough.   Cardiovascular: Negative for chest pain and palpitations.  Gastrointestinal: Negative for nausea and vomiting.  Endocrine: Negative for cold intolerance and heat intolerance.      Objective:    BP 130/70   Pulse (!) 110   Temp 98.2 F (36.8 C) (Oral)   Ht 5\' 6"  (1.676 m)   Wt 241 lb 3.2 oz (109.4 kg)   SpO2 96%   BMI 38.93 kg/m  BP Readings from Last 3 Encounters:  06/23/16 130/70  05/11/16 122/80  04/07/16 128/72   Wt Readings from Last 3 Encounters:  06/23/16 241 lb 3.2 oz (109.4 kg)  05/11/16 242 lb (109.8 kg)  04/07/16 243 lb 12.8 oz (110.6 kg)    Physical Exam  Constitutional: She appears well-developed  and well-nourished.  Eyes: Conjunctivae are normal.  Cardiovascular: Normal rate, regular rhythm, normal heart sounds and normal pulses.   Pulmonary/Chest: Effort normal and breath sounds normal. She has no wheezes. She has no rhonchi. She has no rales.  Neurological: She is alert.  Skin: Skin is warm and dry.  Psychiatric: She has a normal mood and affect. Her speech is normal and behavior is normal. Thought content normal.  Vitals reviewed.      Assessment & Plan:   Problem List Items Addressed This Visit      Cardiovascular and Mediastinum   Hypertension    At goal. Continue current regimen.        Endocrine   Diabetes mellitus, type 2 ([place])    At goal based on blood sugars reported by patient. Will check A1c at next visit.      Hypothyroidism    Patient tolerating medication well. Pending TSH to  evaluate for euthyroid.      Relevant Medications   levothyroxine (SYNTHROID, LEVOTHROID) 137 MCG tablet   Other Relevant Orders   TSH    Other Visit Diagnoses    Osteoarthritis of right knee, unspecified osteoarthritis type    -  Primary   Relevant Medications   omeprazole (PRILOSEC) 20 MG capsule       I am having Cynthia [name] start on omeprazole and Zoster Vaccine Live (PF). I am also having her maintain her Calcium Carbonate-Vitamin D, aspirin, metFORMIN, atorvastatin, meloxicam, amLODipine, losartan-hydrochlorothiazide, ONETOUCH DELICA LANCETS FINE, ONE TOUCH ULTRA TEST, BOOSTRIX, and levothyroxine.   Meds ordered this encounter  Medications  . BOOSTRIX 5-2.5-18.5 injection  . levothyroxine (SYNTHROID, LEVOTHROID) 137 MCG tablet    Sig: Take 1 tablet (137 mcg total) by mouth daily before breakfast.    Dispense:  90 tablet    Refill:  1    Order Specific Question:   Supervising Provider    Answer:   Deborra Medina L [[id number]]  . omeprazole (PRILOSEC) 20 MG capsule    Sig: Take 1 capsule (20 mg total) by mouth daily.    Dispense:  90 capsule    Refill:  1    Order Specific Question:   Supervising Provider    Answer:   Derrel Nip, TERESA L [[id number]]  . Zoster Vaccine Live, PF, (ZOSTAVAX) 65681 UNT/0.65ML injection    Sig: Inject 19,400 Units into the skin once. Please have vaccine done at your local pharmacy.    Dispense:  1 each    Refill:  0    Order Specific Question:   Supervising Provider    Answer:   Crecencio Mc [[id number]]    Return precautions given.   Risks, benefits, and alternatives of the medications and treatment plan prescribed today were discussed, and patient expressed understanding.   Education regarding symptom management and diagnosis given to patient on AVS.  Continue to follow with Mable Paris, FNP for routine health maintenance.   Cynthia [name] and I agreed with plan.   Mable Paris, FNP

## 2016-06-24 ENCOUNTER — Telehealth: Payer: Self-pay | Admitting: *Deleted

## 2016-06-24 NOTE — Telephone Encounter (Signed)
Patient has been notified

## 2016-06-24 NOTE — Telephone Encounter (Signed)
Pt has requested lab results Pt contact [phone]

## 2016-06-29 ENCOUNTER — Telehealth: Payer: Self-pay | Admitting: *Deleted

## 2016-06-29 NOTE — Telephone Encounter (Signed)
Pt requested a returned call  Pt contact [phone]

## 2016-06-29 NOTE — Progress Notes (Signed)
left message for patient to return call back.

## 2016-06-29 NOTE — Progress Notes (Signed)
Spoken to patient, she stated she doesn't have a way to check either.  She stated she feels fine and has been going for walks daily, She feels really good.

## 2016-07-25 ENCOUNTER — Other Ambulatory Visit: Payer: Self-pay

## 2016-07-25 MED ORDER — METFORMIN HCL 500 MG PO TABS: ORAL_TABLET | ORAL | 1 refills | Status: DC

## 2016-07-25 NOTE — Telephone Encounter (Signed)
Medication has been refilled.

## 2016-08-01 ENCOUNTER — Other Ambulatory Visit: Payer: Self-pay | Admitting: Family

## 2016-08-04 ENCOUNTER — Other Ambulatory Visit: Payer: Self-pay

## 2016-08-04 ENCOUNTER — Telehealth: Payer: Self-pay | Admitting: Family

## 2016-08-04 DIAGNOSIS — E039 Hypothyroidism, unspecified: Secondary | ICD-10-CM

## 2016-08-04 MED ORDER — LEVOTHYROXINE SODIUM 137 MCG PO TABS: 137.0000 ug | ORAL_TABLET | Freq: Every day | ORAL | 1 refills | Status: DC

## 2016-08-04 NOTE — Telephone Encounter (Signed)
Pt called requesting a refill on her levothyroxine (SYNTHROID, LEVOTHROID) 137 MCG tablet. Thank you!  Pharmacy - [id number] - [place], [place] - [address]  Call pt @ [phone]

## 2016-08-04 NOTE — Telephone Encounter (Signed)
Medication has been refilled.

## 2016-08-05 ENCOUNTER — Other Ambulatory Visit: Payer: Self-pay

## 2016-08-05 MED ORDER — ATORVASTATIN CALCIUM 20 MG PO TABS: ORAL_TABLET | ORAL | 1 refills | Status: DC

## 2016-08-05 NOTE — Telephone Encounter (Signed)
Medication has been refilled.

## 2016-08-15 ENCOUNTER — Ambulatory Visit (INDEPENDENT_AMBULATORY_CARE_PROVIDER_SITE_OTHER): Payer: Medicare Other | Admitting: Family

## 2016-08-15 ENCOUNTER — Encounter: Payer: Self-pay | Admitting: Family

## 2016-08-15 VITALS — BP 132/88 | HR 94 | Temp 98.1°F | Ht 66.0 in | Wt 240.2 lb

## 2016-08-15 DIAGNOSIS — Z1211 Encounter for screening for malignant neoplasm of colon: Secondary | ICD-10-CM

## 2016-08-15 DIAGNOSIS — M1990 Unspecified osteoarthritis, unspecified site: Secondary | ICD-10-CM

## 2016-08-15 DIAGNOSIS — R938 Abnormal findings on diagnostic imaging of other specified body structures: Secondary | ICD-10-CM

## 2016-08-15 DIAGNOSIS — E119 Type 2 diabetes mellitus without complications: Secondary | ICD-10-CM | POA: Diagnosis not present

## 2016-08-15 DIAGNOSIS — R9389 Abnormal findings on diagnostic imaging of other specified body structures: Secondary | ICD-10-CM

## 2016-08-15 DIAGNOSIS — E039 Hypothyroidism, unspecified: Secondary | ICD-10-CM

## 2016-08-15 DIAGNOSIS — I10 Essential (primary) hypertension: Secondary | ICD-10-CM | POA: Diagnosis not present

## 2016-08-15 LAB — HEMOGLOBIN A1C: Hgb A1c MFr Bld: 6.4 % (ref 4.6–6.5)

## 2016-08-15 LAB — TSH: TSH: 0.82 u[IU]/mL (ref 0.35–4.50)

## 2016-08-15 NOTE — Assessment & Plan Note (Signed)
At goal. Continue current regimen. 

## 2016-08-15 NOTE — Assessment & Plan Note (Signed)
Stable on Mobic. Also takes omeprazole. We'll continue to follow.

## 2016-08-15 NOTE — Progress Notes (Signed)
Subjective:    Patient ID: Cynthia [name], female    DOB: [birth year], 70 y.o.   MRN: [id number]  CC: CEDRA VILLALON is a 70 y.o. female who presents today for follow up.   HPI: Here for follow up. Feeling well. No complaints.  HTN- Compliant with medication. Denies exertional chest pain or pressure, numbness or tingling radiating to left arm or jaw, palpitations, dizziness, frequent headaches, changes in vision, or shortness of breath.   Hypothyroidism Compliant with medication. No palpitations.   DM- Average BS fasting 109. Compliant with medications. No hypoglycemic episodes.    Left lung  Nodule- continues to follow with [name], appt in one month.    OA of knees- On mobic with relief. Takes omeprazole.     HISTORY:  Past Medical History:  Diagnosis Date  . Arthritis   . Diabetes mellitus without complication ([place])   . Hyperlipidemia   . Hypertension   . Lung nodule   . Thyroid disease    Past Surgical History:  Procedure Laterality Date  . BREAST BIOPSY Left 11/25/2015   stereo results pending  . BREAST EXCISIONAL BIOPSY Left yrs ago   benign  . THYROIDECTOMY     Dr. [name]  . VAGINAL DELIVERY     4   Family History  Problem Relation Age of Onset  . Hypertension Mother   . Hypertension Father   . Diabetes Sister     Allergies: Patient has no known allergies. Current Outpatient Prescriptions on File Prior to Visit  Medication Sig Dispense Refill  . amLODipine (NORVASC) 10 MG tablet TAKE 1 TABLET (10 MG TOTAL) BY MOUTH DAILY. 90 tablet 3  . aspirin 81 MG tablet Take 81 mg by mouth daily.    Marland Kitchen atorvastatin (LIPITOR) 20 MG tablet TAKE 1 TABLET (20 MG TOTAL) BY MOUTH DAILY. 90 tablet 1  . BOOSTRIX 5-2.5-18.5 injection     . Calcium Carbonate-Vitamin D 600-400 MG-UNIT per tablet Take 1 tablet by mouth daily.    Marland Kitchen levothyroxine (SYNTHROID, LEVOTHROID) 137 MCG tablet Take 1 tablet (137 mcg total) by mouth daily before breakfast. 90 tablet 1  .  losartan-hydrochlorothiazide (HYZAAR) 50-12.5 MG tablet TAKE 1 TABLET BY MOUTH DAILY. 90 tablet 1  . meloxicam (MOBIC) 15 MG tablet Take 1 tablet (15 mg total) by mouth daily. 90 tablet 0  . metFORMIN (GLUCOPHAGE) 500 MG tablet TAKE 1 TABLET (500 MG TOTAL) BY MOUTH 2 (TWO) TIMES DAILY WITH A MEAL. 180 tablet 1  . omeprazole (PRILOSEC) 20 MG capsule Take 1 capsule (20 mg total) by mouth daily. 90 capsule 1  . ONE TOUCH ULTRA TEST test strip TEST BLOOD SUGAR 1-2 TIMES A DAY 100 each 4  . ONETOUCH DELICA LANCETS FINE MISC CHECK BLOOD GLUCOSE TWICE A DAY 100 each 3   No current facility-administered medications on file prior to visit.     Social History  Substance Use Topics  . Smoking status: Former Smoker    Years: 20.00    Types: Cigarettes    Quit date: 11/17/2012  . Smokeless tobacco: Former Systems developer  . Alcohol use No    Review of Systems  Constitutional: Negative for chills and fever.  Respiratory: Negative for cough.   Cardiovascular: Negative for chest pain and palpitations.  Gastrointestinal: Negative for nausea and vomiting.  Neurological: Negative for headaches.      Objective:    BP 132/88   Pulse 94   Temp 98.1 F (36.7 C) (Oral)   Ht  5\' 6"  (1.676 m)   Wt 240 lb 3.2 oz (109 kg)   SpO2 96%   BMI 38.77 kg/m  BP Readings from Last 3 Encounters:  08/15/16 132/88  06/23/16 130/70  05/11/16 122/80   Wt Readings from Last 3 Encounters:  08/15/16 240 lb 3.2 oz (109 kg)  06/23/16 241 lb 3.2 oz (109.4 kg)  05/11/16 242 lb (109.8 kg)    Physical Exam  Constitutional: She appears well-developed and well-nourished.  Eyes: Conjunctivae are normal.  Cardiovascular: Normal rate, regular rhythm, normal heart sounds and normal pulses.   Pulmonary/Chest: Effort normal and breath sounds normal. She has no wheezes. She has no rhonchi. She has no rales.  Neurological: She is alert.  Skin: Skin is warm and dry.  Psychiatric: She has a normal mood and affect. Her speech is normal  and behavior is normal. Thought content normal.  Vitals reviewed.      Assessment & Plan:   Problem List Items Addressed This Visit      Cardiovascular and Mediastinum   Hypertension    At goal. Continue current regimen.        Endocrine   Diabetes mellitus, type 2 ([place])    Pending A1c. Will follow.      Relevant Orders   Hemoglobin A1c   Hypothyroidism    Symptomatically stable. Pending TSH.      Relevant Orders   TSH     Musculoskeletal and Integument   Arthritis    Stable on Mobic. Also takes omeprazole. We'll continue to follow.        Other   Abnormal CT scan, chest    Stable. Continues to follow with Dr. Genevive Bi.        Other Visit Diagnoses    Screen for colon cancer    -  Primary   Relevant Orders   Ambulatory referral to Gastroenterology       I am having Ms. Portugal maintain her Calcium Carbonate-Vitamin D, aspirin, meloxicam, amLODipine, losartan-hydrochlorothiazide, ONETOUCH DELICA LANCETS FINE, ONE TOUCH ULTRA TEST, BOOSTRIX, omeprazole, metFORMIN, levothyroxine, and atorvastatin.   No orders of the defined types were placed in this encounter.   Return precautions given.   Risks, benefits, and alternatives of the medications and treatment plan prescribed today were discussed, and patient expressed understanding.   Education regarding symptom management and diagnosis given to patient on AVS.  Continue to follow with Mable Paris, FNP for routine health maintenance.   Cynthia [name] and I agreed with plan.   Mable Paris, FNP

## 2016-08-15 NOTE — Progress Notes (Signed)
Pre visit review using our clinic review tool, if applicable. No additional management support is needed unless otherwise documented below in the visit note. 

## 2016-08-15 NOTE — Patient Instructions (Signed)
Labs today Pleasure seeing you

## 2016-08-15 NOTE — Assessment & Plan Note (Signed)
Symptomatically stable. Pending TSH.

## 2016-08-15 NOTE — Assessment & Plan Note (Signed)
Pending A1c. Will follow.

## 2016-08-15 NOTE — Assessment & Plan Note (Addendum)
Stable. Continues to follow with Dr. Genevive Bi.

## 2016-08-18 ENCOUNTER — Telehealth: Payer: Self-pay | Admitting: Family

## 2016-08-18 NOTE — Telephone Encounter (Signed)
Patient has been notified. Please see result note.

## 2016-08-18 NOTE — Telephone Encounter (Signed)
Pt called returning your call in regards to labs. Thank you!  Call pt @ [phone]

## 2016-09-05 ENCOUNTER — Telehealth: Payer: Self-pay

## 2016-09-05 NOTE — Telephone Encounter (Signed)
LVM for patient to call office to schedule colonoscopy.

## 2016-09-14 ENCOUNTER — Ambulatory Visit: Payer: Self-pay | Admitting: Family

## 2016-09-16 ENCOUNTER — Telehealth: Payer: Self-pay | Admitting: Gastroenterology

## 2016-09-16 NOTE — Telephone Encounter (Signed)
Patient has called back and would like to schedule her colonoscopy. The referral is in the chart.

## 2016-09-20 ENCOUNTER — Telehealth: Payer: Self-pay | Admitting: Gastroenterology

## 2016-09-20 NOTE — Telephone Encounter (Signed)
Returning a call for colonoscopy. You can also call her daughters cell phone [phone]. Her mother is dying so she has been in and out.

## 2016-09-21 NOTE — Telephone Encounter (Signed)
LMTCB at [place]

## 2016-09-22 ENCOUNTER — Ambulatory Visit: Payer: Self-pay | Admitting: Cardiothoracic Surgery

## 2016-09-22 ENCOUNTER — Ambulatory Visit: Payer: Medicare Other

## 2016-09-23 ENCOUNTER — Ambulatory Visit: Payer: Self-pay | Admitting: Cardiothoracic Surgery

## 2016-09-26 ENCOUNTER — Other Ambulatory Visit: Payer: Self-pay | Admitting: *Deleted

## 2016-09-26 ENCOUNTER — Telehealth: Payer: Self-pay

## 2016-09-26 DIAGNOSIS — R911 Solitary pulmonary nodule: Secondary | ICD-10-CM

## 2016-09-26 NOTE — Telephone Encounter (Signed)
Pt scheduled for a screening colonoscopy at [facility] on 10/14/16 with Vicente Males.

## 2016-09-26 NOTE — Telephone Encounter (Signed)
Thanks

## 2016-09-26 NOTE — Telephone Encounter (Signed)
Called patient to let her know that I rescheduled her CT Scan on 10/03/2016 and follow up appointment with Dr. Genevive Bi on 10/07/2016. Patient agreed and stated that she would have this done.

## 2016-09-26 NOTE — Telephone Encounter (Signed)
See other notes with colonoscopy appt triage.

## 2016-09-26 NOTE — Telephone Encounter (Signed)
-----   Message from Gavin Pound, [place] sent at 09/22/2016  2:45 PM EST ----- Regarding: Dr. Genevive Bi  Patient had an app with Dr. Genevive Bi tomorrow but she could'nt go in to do her CT scan today. The order runs out on the 5th but she won't be able to make it to that one. Can we put in another order to reschedule her and then make an app?

## 2016-09-26 NOTE — Telephone Encounter (Signed)
Gastroenterology Pre-Procedure Review  Request Date:  Requesting Physician: Dr.   PATIENT REVIEW QUESTIONS: The patient responded to the following health history questions as indicated:    1. Are you having any GI issues? no 2. Do you have a personal history of Polyps? no 3. Do you have a family history of Colon Cancer or Polyps? no 4. Diabetes Mellitus? no 5. Joint replacements in the past 12 months?no 6. Major health problems in the past 3 months?no 7. Any artificial heart valves, MVP, or defibrillator?no    MEDICATIONS & ALLERGIES:    Patient reports the following regarding taking any anticoagulation/antiplatelet therapy:   Plavix, Coumadin, Eliquis, Xarelto, Lovenox, Pradaxa, Brilinta, or Effient? no Aspirin? yes (ASA 81mg )  Patient confirms/reports the following medications:  Current Outpatient Prescriptions  Medication Sig Dispense Refill  . amLODipine (NORVASC) 10 MG tablet TAKE 1 TABLET (10 MG TOTAL) BY MOUTH DAILY. 90 tablet 3  . aspirin 81 MG tablet Take 81 mg by mouth daily.    Marland Kitchen atorvastatin (LIPITOR) 20 MG tablet TAKE 1 TABLET (20 MG TOTAL) BY MOUTH DAILY. 90 tablet 1  . BOOSTRIX 5-2.5-18.5 injection     . Calcium Carbonate-Vitamin D 600-400 MG-UNIT per tablet Take 1 tablet by mouth daily.    Marland Kitchen levothyroxine (SYNTHROID, LEVOTHROID) 137 MCG tablet Take 1 tablet (137 mcg total) by mouth daily before breakfast. 90 tablet 1  . losartan-hydrochlorothiazide (HYZAAR) 50-12.5 MG tablet TAKE 1 TABLET BY MOUTH DAILY. 90 tablet 1  . meloxicam (MOBIC) 15 MG tablet Take 1 tablet (15 mg total) by mouth daily. 90 tablet 0  . metFORMIN (GLUCOPHAGE) 500 MG tablet TAKE 1 TABLET (500 MG TOTAL) BY MOUTH 2 (TWO) TIMES DAILY WITH A MEAL. 180 tablet 1  . omeprazole (PRILOSEC) 20 MG capsule Take 1 capsule (20 mg total) by mouth daily. 90 capsule 1  . ONE TOUCH ULTRA TEST test strip TEST BLOOD SUGAR 1-2 TIMES A DAY 100 each 4  . ONETOUCH DELICA LANCETS FINE MISC CHECK BLOOD GLUCOSE TWICE A DAY  100 each 3   No current facility-administered medications for this visit.     Patient confirms/reports the following allergies:  No Known Allergies  No orders of the defined types were placed in this encounter.   AUTHORIZATION INFORMATION Primary Insurance: 1D#: Group #:  Secondary Insurance: 1D#: Group #:  SCHEDULE INFORMATION: Date: 10/14/16 Time: Location: [place]

## 2016-09-30 NOTE — Telephone Encounter (Signed)
The notification/prior authorization case information was transmitted on 09/30/2016 at 9:22 AM CST. The notification/prior authorization reference number is [id number]. Please print this page for your records.  Your Notification/Prior Authorization submission has been Approved and no further action is required for this request. Please note that it may take a few days for the procedure coverage status to be updated and viewable via the Notification/Prior Authorization Status transaction on this website.

## 2016-10-03 ENCOUNTER — Ambulatory Visit
Admission: RE | Admit: 2016-10-03 | Discharge: 2016-10-03 | Disposition: A | Discharge status: Home / Self Care | Payer: Medicare Other | Source: Ambulatory Visit | Attending: Cardiothoracic Surgery | Admitting: Cardiothoracic Surgery

## 2016-10-03 DIAGNOSIS — R911 Solitary pulmonary nodule: Secondary | ICD-10-CM | POA: Diagnosis not present

## 2016-10-03 IMAGING — CT CT CHEST W/O CM
2 of 3 series · 15 of 36 positions shown, 18 images · non-contrast
Comparison: Previous studies dating back to [DATE]

CLINICAL DATA: Followup left lung nodule.

EXAM:
CT CHEST WITHOUT CONTRAST
TECHNIQUE: Multidetector CT imaging of the chest was performed following the
standard protocol without IV contrast.

[Series 2: thorax · axial · 0.68mm/px · z∈[-442,-200]mm · 12 of 143 slices shown, 15 images]
[im 11/143  mediastinal]
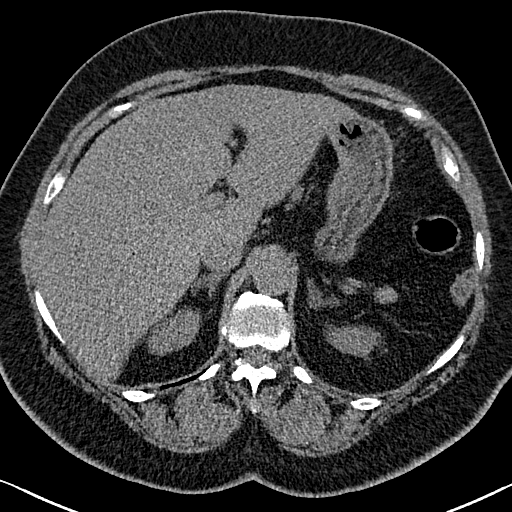
[im 11/143  lung]
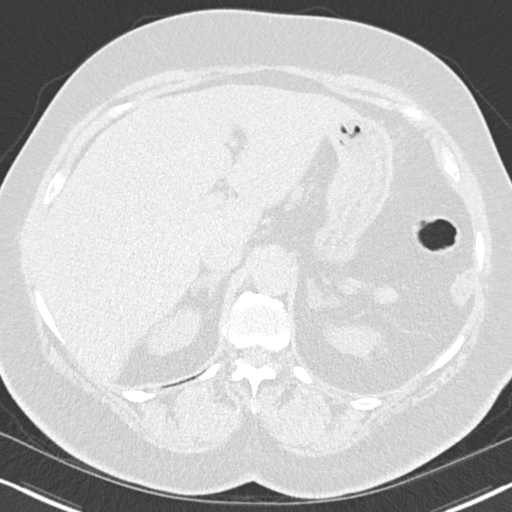
[im 22/143  lung]
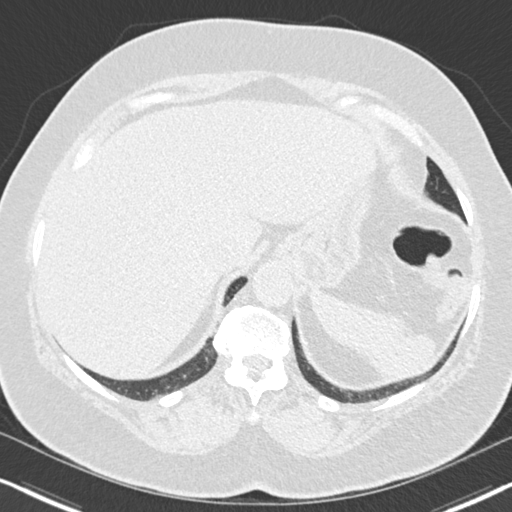
[im 32/143  lung]
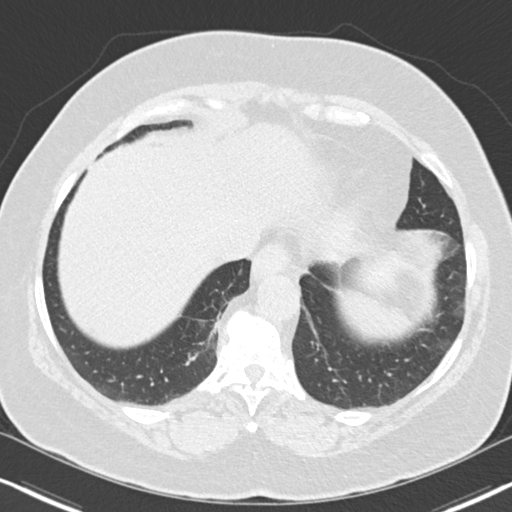
[im 43/143  lung]
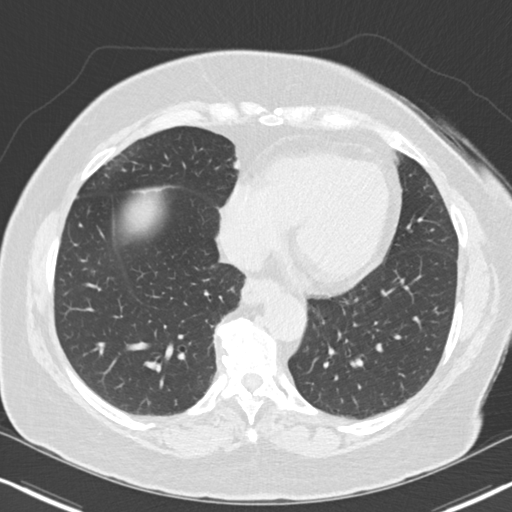
[im 53/143  mediastinal]
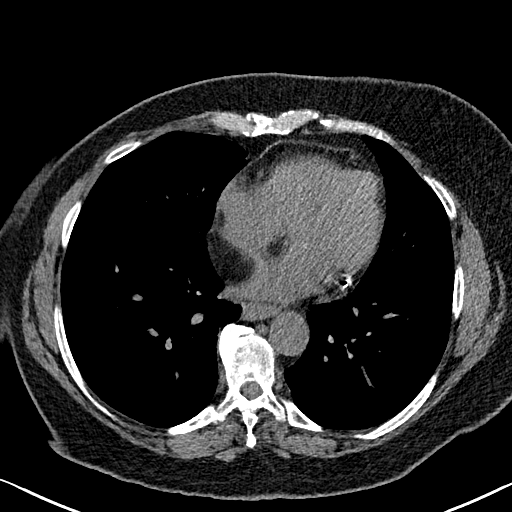
[im 53/143  lung]
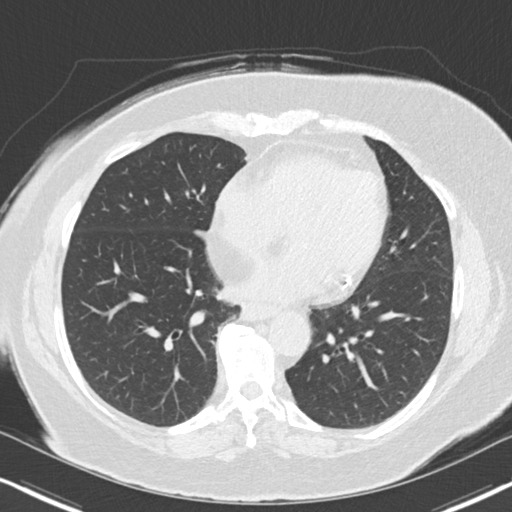
[im 64/143  lung]
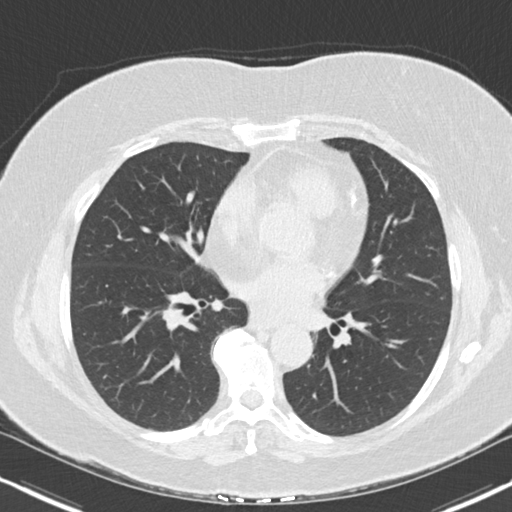
[im 79/143  lung]
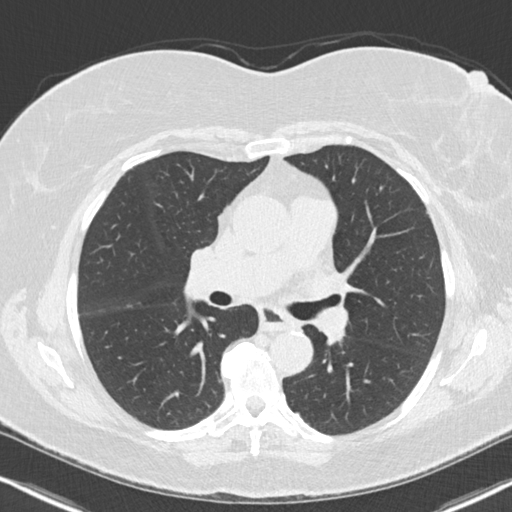
[im 90/143  lung]
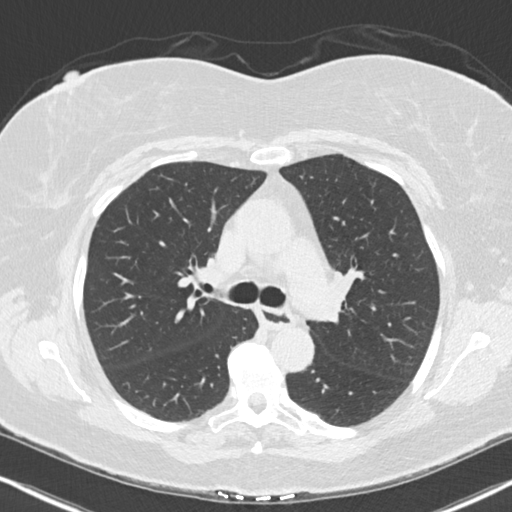
[im 100/143  mediastinal]
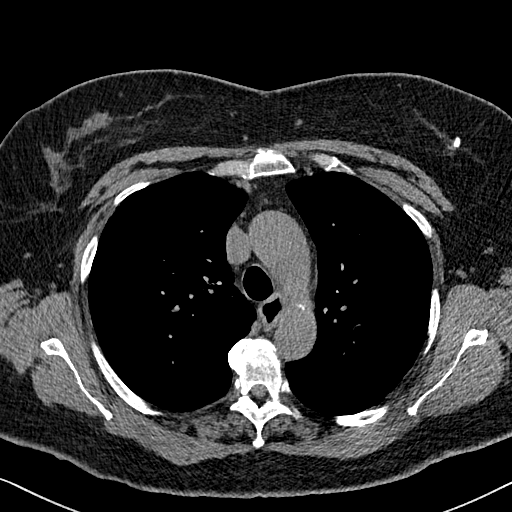
[im 100/143  lung]
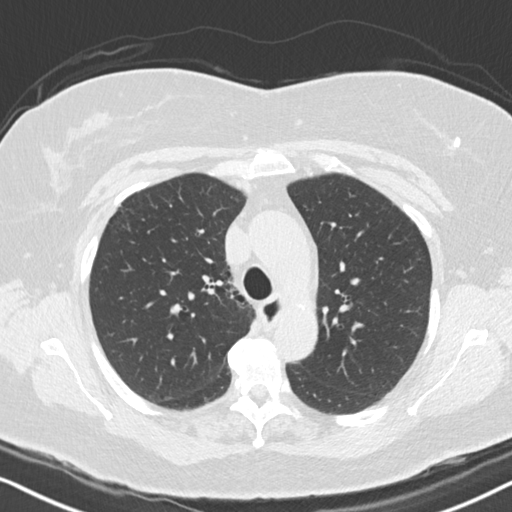
[im 111/143  lung]
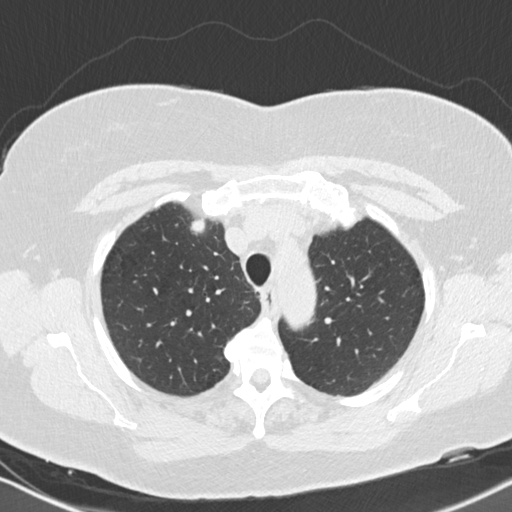
[im 121/143  lung]
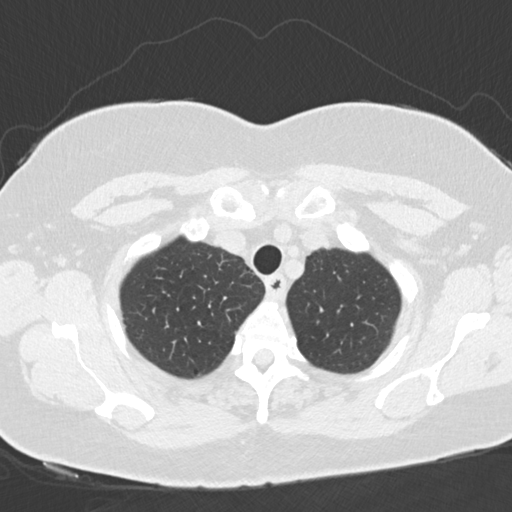
[im 132/143  lung]
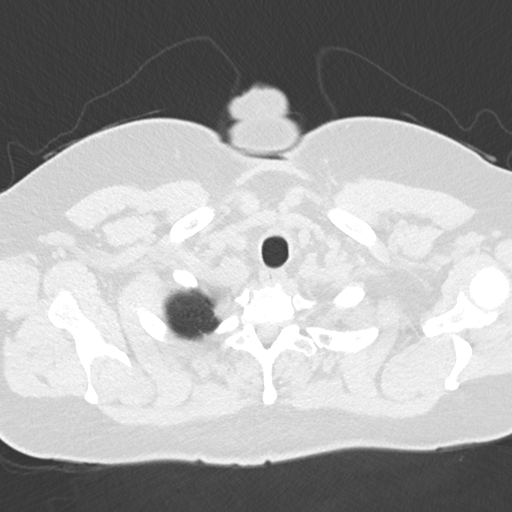

[Series 5: coronal · coronal · 0.58mm/px · 3 of 158 slices shown]
[im 32/158  lung]
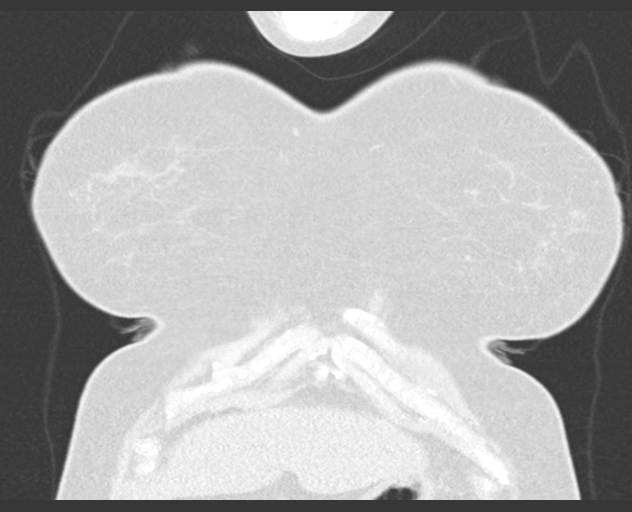
[im 63/158  lung]
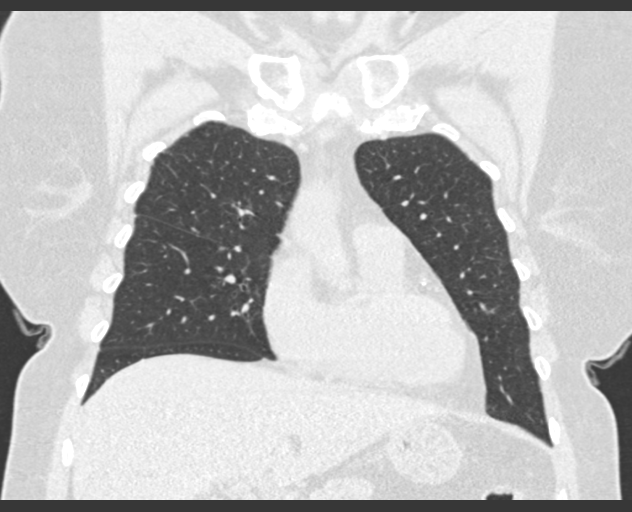
[im 95/158  lung]
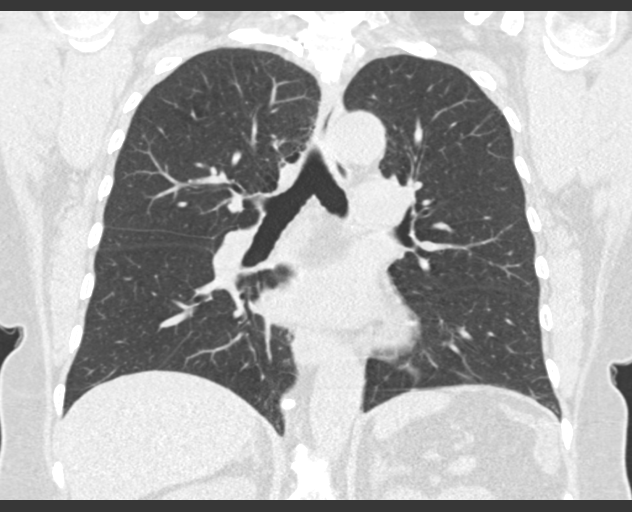

[15 of 36 positions shown; findings below may reference images not displayed]

FINDINGS: Cardiovascular: No acute findings. Aortic and coronary artery
atherosclerosis.

Mediastinum/Nodes: No masses or pathologically enlarged lymph nodes
identified on this unenhanced exam.

Lungs/Pleura: Pulmonary nodule in the medial left upper lobe
measuring 7 x 10 mm on image [DATE] shows no significant change
compared to previous studies. 3 mm pulmonary nodule in the left lung
apex on image 16 is also stable. No new or enlarging pulmonary
nodules or masses identified. No evidence pulmonary infiltrate or
pleural effusion.

Upper Abdomen:  Unremarkable.

Musculoskeletal:  No suspicious bone lesions.
IMPRESSION: Stable pulmonary nodule in the left upper lobe compared to previous
studies dating back to [F7], consistent with benign etiology. No
further imaging followup required. This recommendation follows the
consensus statement: Guidelines for Management of Small Pulmonary
Nodules Detected on CT Images: From the [HOSPITAL] [F7];

## 2016-10-04 ENCOUNTER — Telehealth: Payer: Self-pay

## 2016-10-04 NOTE — Telephone Encounter (Signed)
Cynthia [name] had a sudden death in her family. The funeral is on Friday so she will not be able to come in for that appointment. She won't be able to come in the following Friday because that is when she is having her colonoscopy. I have rescheduled her to 10/21/2016. She already had her x-ray done. Will she have to do them again?

## 2016-10-07 ENCOUNTER — Telehealth: Payer: Self-pay | Admitting: Gastroenterology

## 2016-10-07 ENCOUNTER — Other Ambulatory Visit: Payer: Self-pay

## 2016-10-07 ENCOUNTER — Ambulatory Visit: Payer: Medicare Other | Admitting: Cardiothoracic Surgery

## 2016-10-07 MED ORDER — LOSARTAN POTASSIUM-HCTZ 50-12.5 MG PO TABS: 1.0000 | ORAL_TABLET | Freq: Every day | ORAL | 1 refills | Status: DC

## 2016-10-07 NOTE — Telephone Encounter (Signed)
Pt needed to know which medications she will need to stop 5 days prior to her colonoscopy. Advised pt she does not have any medications she needs to stop.

## 2016-10-07 NOTE — Telephone Encounter (Signed)
Please call patient at [phone]. She is having a colonoscopy and has a question regarding the medication that she needs to stop.

## 2016-10-07 NOTE — Telephone Encounter (Signed)
Medication has been refilled.

## 2016-10-07 NOTE — Telephone Encounter (Signed)
Called patient back and told her that it's okay for her to be seen until 10/21/2016 and that she doesn't need to do her CT Scan again since she just had it done on 10/03/2016. Patient understood and had no further questions.

## 2016-10-09 ENCOUNTER — Emergency Department
Admission: EM | Admit: 2016-10-09 | Discharge: 2016-10-10 | Disposition: A | Discharge status: Home / Self Care | Payer: Medicare Other | Attending: Emergency Medicine | Admitting: Emergency Medicine

## 2016-10-09 ENCOUNTER — Other Ambulatory Visit: Payer: Self-pay

## 2016-10-09 ENCOUNTER — Emergency Department: Payer: Medicare Other

## 2016-10-09 DIAGNOSIS — Z7984 Long term (current) use of oral hypoglycemic drugs: Secondary | ICD-10-CM | POA: Insufficient documentation

## 2016-10-09 DIAGNOSIS — R079 Chest pain, unspecified: Secondary | ICD-10-CM | POA: Diagnosis not present

## 2016-10-09 DIAGNOSIS — M79605 Pain in left leg: Secondary | ICD-10-CM | POA: Diagnosis not present

## 2016-10-09 DIAGNOSIS — Z7982 Long term (current) use of aspirin: Secondary | ICD-10-CM | POA: Diagnosis not present

## 2016-10-09 DIAGNOSIS — E039 Hypothyroidism, unspecified: Secondary | ICD-10-CM | POA: Insufficient documentation

## 2016-10-09 DIAGNOSIS — I10 Essential (primary) hypertension: Secondary | ICD-10-CM | POA: Diagnosis not present

## 2016-10-09 DIAGNOSIS — Z87891 Personal history of nicotine dependence: Secondary | ICD-10-CM | POA: Diagnosis not present

## 2016-10-09 DIAGNOSIS — Z79899 Other long term (current) drug therapy: Secondary | ICD-10-CM | POA: Diagnosis not present

## 2016-10-09 DIAGNOSIS — E119 Type 2 diabetes mellitus without complications: Secondary | ICD-10-CM | POA: Insufficient documentation

## 2016-10-09 DIAGNOSIS — M79604 Pain in right leg: Secondary | ICD-10-CM | POA: Insufficient documentation

## 2016-10-09 LAB — BASIC METABOLIC PANEL
Anion gap: 11 (ref 5–15)
BUN: 16 mg/dL (ref 6–20)
CHLORIDE: 100 mmol/L — AB (ref 101–111)
CO2: 26 mmol/L (ref 22–32)
Calcium: 10 mg/dL (ref 8.9–10.3)
Creatinine, Ser: 0.83 mg/dL (ref 0.44–1.00)
GFR calc Af Amer: >60 mL/min (ref >60)
GFR calc non Af Amer: >60 mL/min (ref >60)
GLUCOSE: 163 mg/dL — AB (ref 65–99)
POTASSIUM: 3.1 mmol/L — AB (ref 3.5–5.1)
SODIUM: 137 mmol/L (ref 135–145)

## 2016-10-09 LAB — CBC
HCT: 40.9 % (ref 35.0–47.0)
Hemoglobin: 14.3 g/dL (ref 12.0–16.0)
MCH: 31.6 pg (ref 26.0–34.0)
MCHC: 34.9 g/dL (ref 32.0–36.0)
MCV: 90.7 fL (ref 80.0–100.0)
Platelets: 273 10*3/uL (ref 150–440)
RBC: 4.51 MIL/uL (ref 3.80–5.20)
RDW: 14 % (ref 11.5–14.5)
WBC: 7.4 10*3/uL (ref 3.6–11.0)

## 2016-10-09 LAB — TROPONIN I
Troponin I: <0.03 ng/mL (ref <0.03)
Troponin I: <0.03 ng/mL (ref <0.03)

## 2016-10-09 IMAGING — CR DG CHEST 2V
2 series · 2 of 2 positions shown · non-contrast
Comparison: None.

CLINICAL DATA: Chest pain and sore throat.

EXAM:
CHEST  2 VIEW

[chest pa]
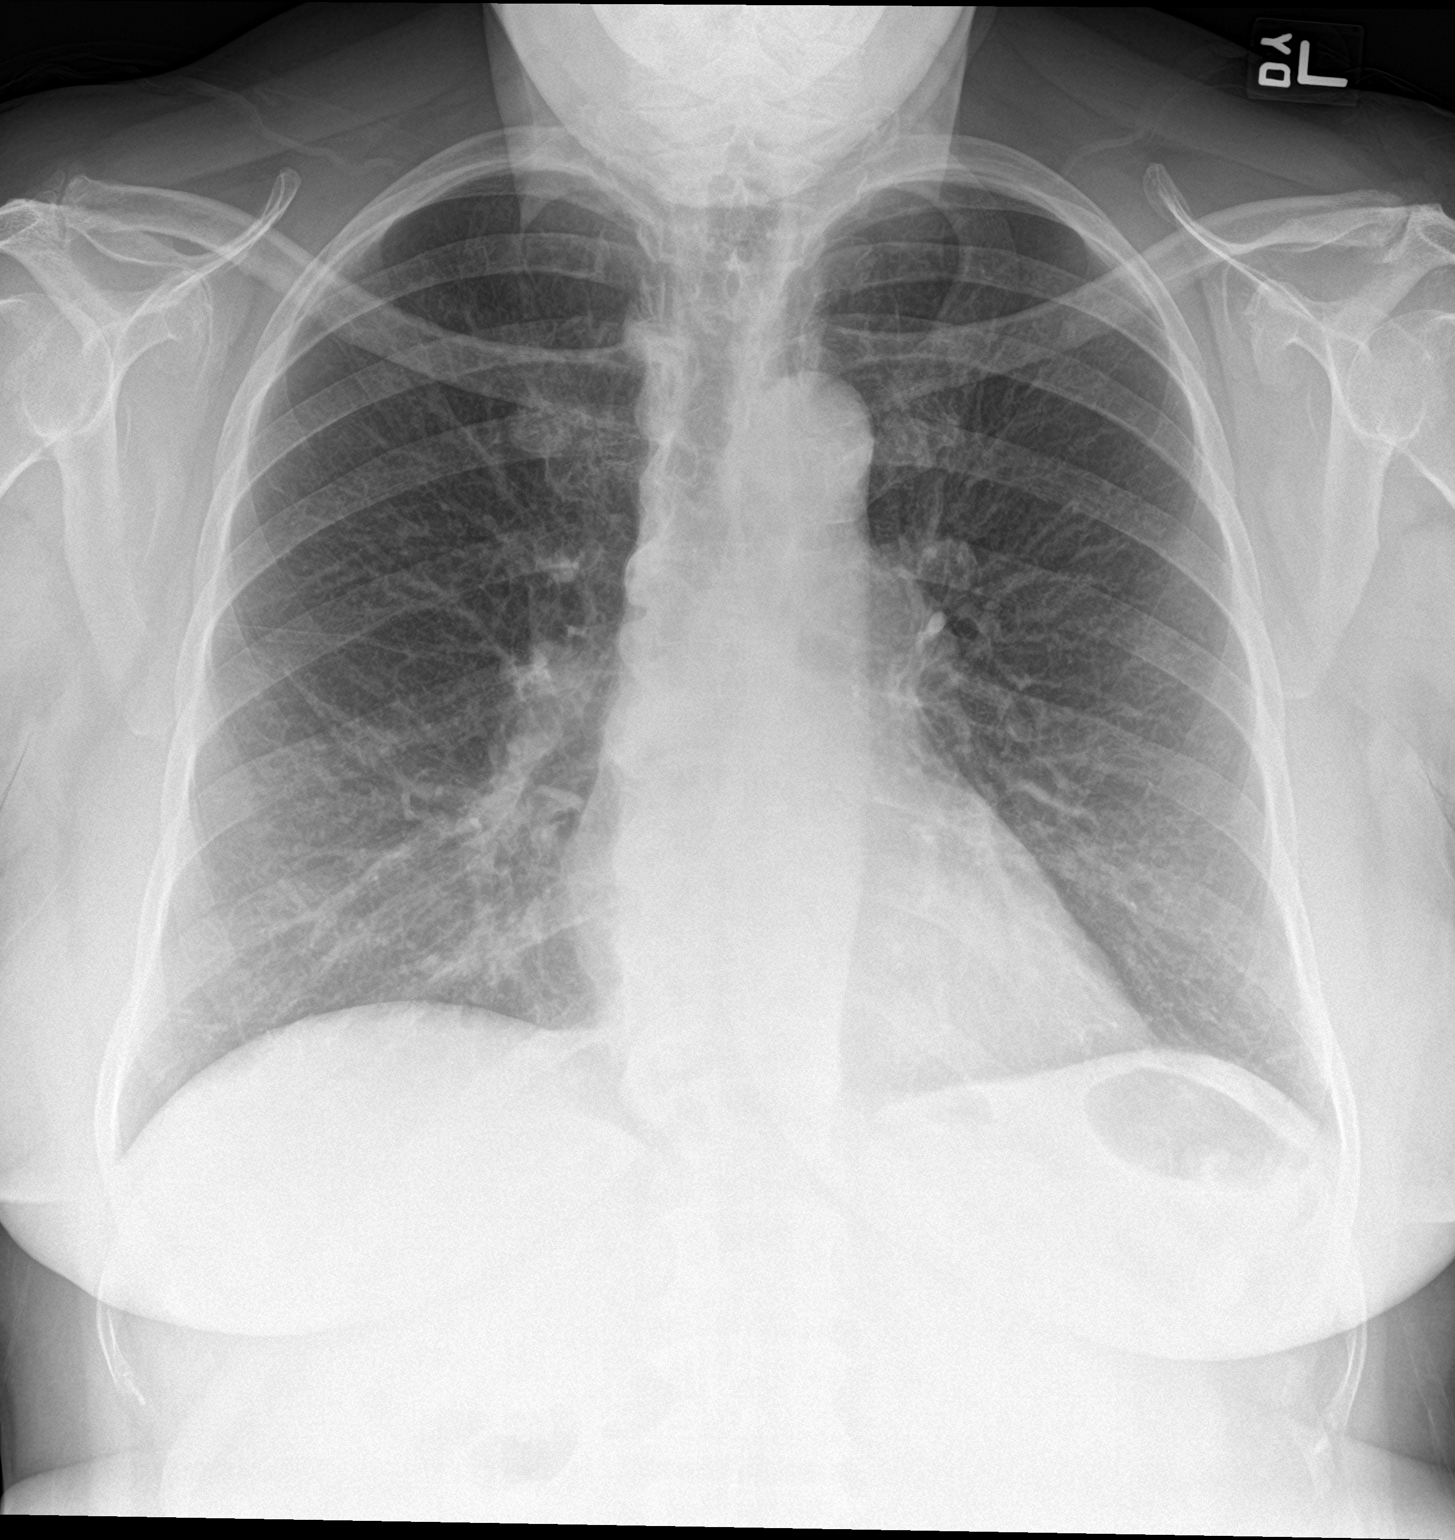

[chest lat]
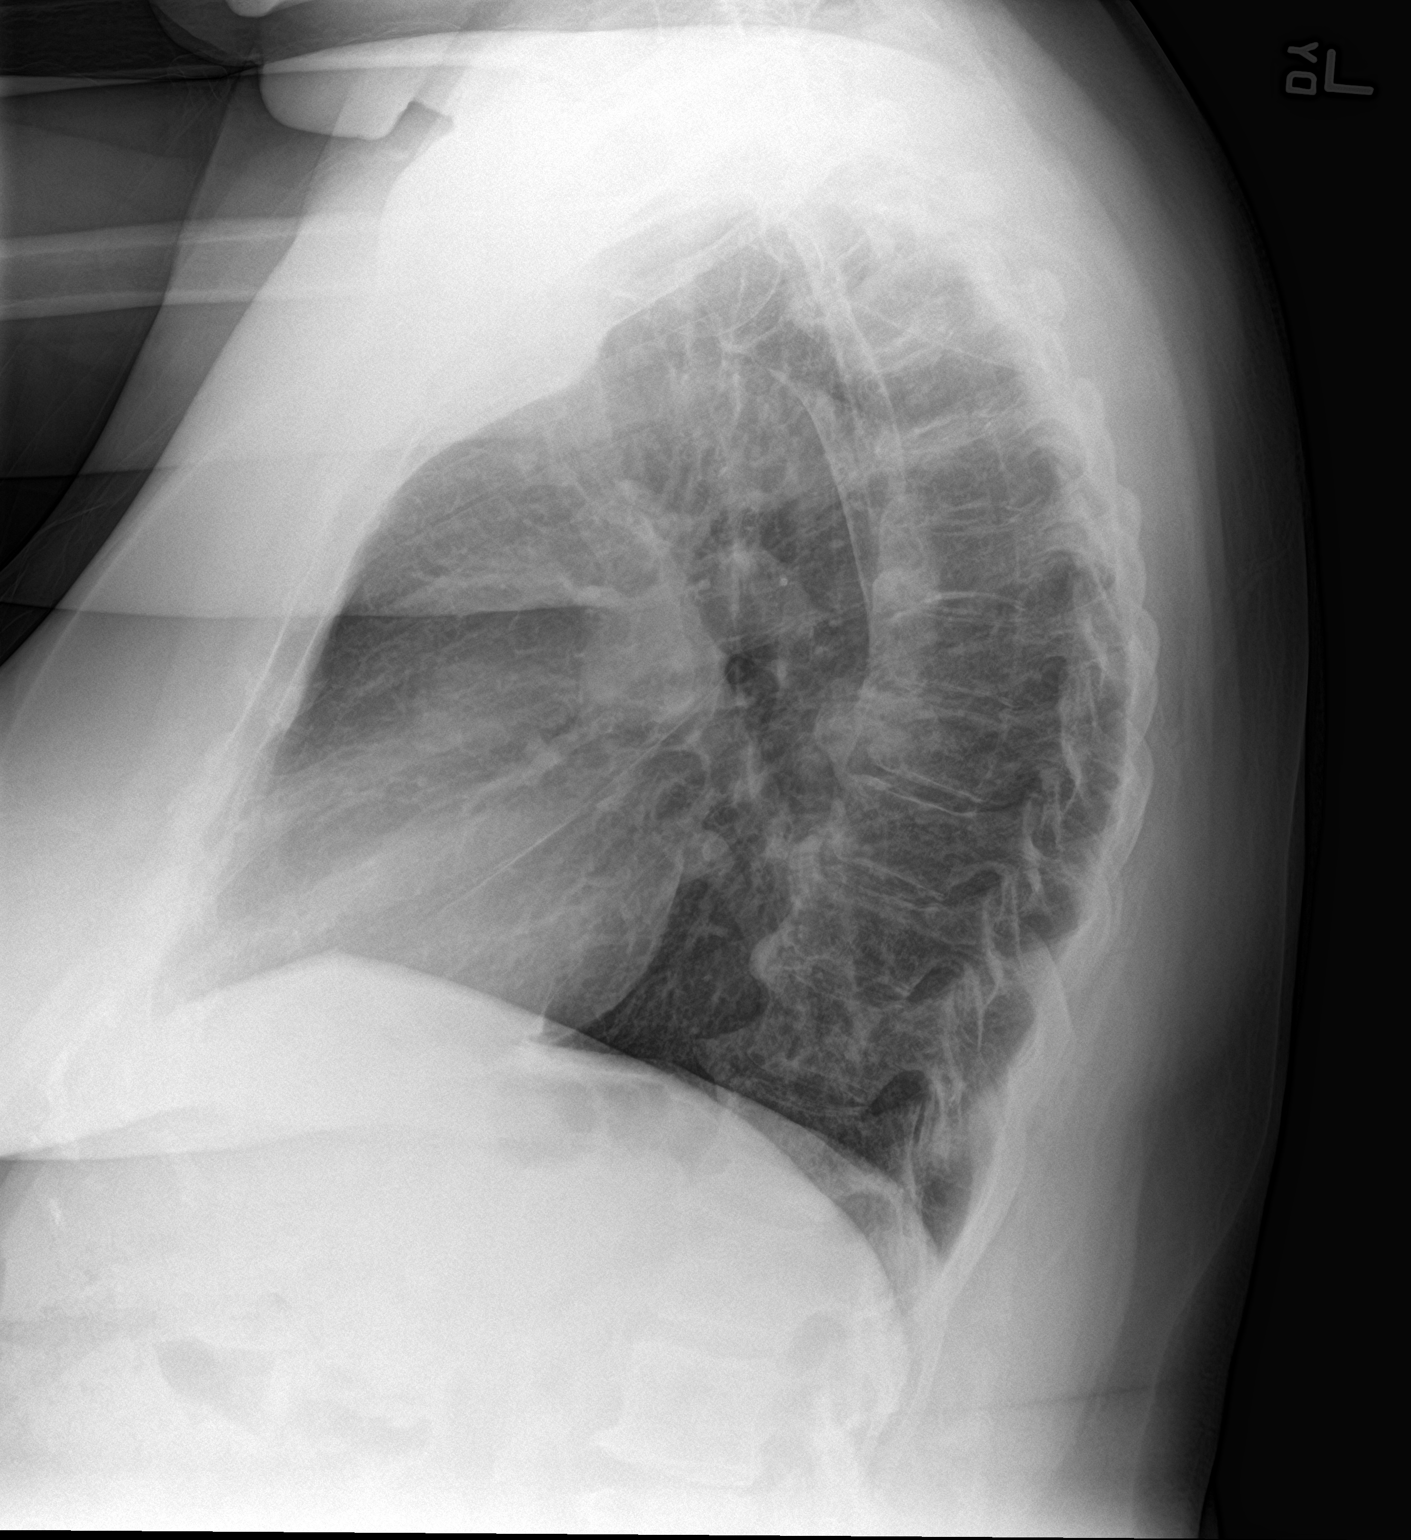

[2 of 2 positions shown; findings below may reference images not displayed]

FINDINGS: The heart size and mediastinal contours are within normal limits.
Both lungs are clear. No evidence of pneumothorax or pleural
effusion. Mild thoracic spine degenerative changes noted.
IMPRESSION: No active cardiopulmonary disease.

## 2016-10-09 MED ORDER — SODIUM CHLORIDE 0.9 % IV BOLUS (SEPSIS)
1000.0000 mL | Freq: Once | INTRAVENOUS | Status: AC
  Administered 2016-10-09: 1000 mL via INTRAVENOUS

## 2016-10-09 MED ORDER — GI COCKTAIL ~~LOC~~
30.0000 mL | Freq: Once | ORAL | Status: AC
  Administered 2016-10-09: 30 mL via ORAL
  Filled 2016-10-09: qty 30

## 2016-10-09 NOTE — ED Provider Notes (Signed)
[facility] Emergency Department Provider Note   ____________________________________________   First MD Initiated Contact with Patient 10/09/16 2014     (approximate)  I have reviewed the triage vital signs and the nursing notes.   HISTORY  Chief Complaint Hoarse; Leg Pain; and Chest Pain ("heaviness")   HPI Cynthia [name] is a 71 y.o. female with a history of diabetes as well as hypertension is present to the emergency department today with chest "burning" which started this morning. She describes as a 4 out of 10 at this time. She says it has been constant and without any worsening factors such as exertion. She denies any shortness of breath, nausea or vomiting. Also reports aching to her bilateral ankles. Says that she has no diagnosis of neuropathy and usually does not have his aching. Says that she also feels "hoarse" which is something she woke up this morning as well. Denies any runny nose or cough. Denies any fever. Denies any history of reflux.   Past Medical History:  Diagnosis Date  . Arthritis   . Diabetes mellitus without complication ([place])   . Hyperlipidemia   . Hypertension   . Lung nodule   . Thyroid disease     Patient Active Problem List   Diagnosis Date Noted  . Routine general medical examination at a health care facility 10/21/2015  . Dyspnea on exertion 06/13/2014  . Medicare annual wellness visit, subsequent 03/08/2013  . Abnormal CT scan, chest 11/28/2012  . Hypertension 10/18/2012  . Arthritis 10/18/2012  . Diabetes mellitus, type 2 ([place]) 10/18/2012  . Hypothyroidism 10/18/2012  . Hyperlipidemia 10/18/2012    Past Surgical History:  Procedure Laterality Date  . BREAST BIOPSY Left 11/25/2015   stereo results pending  . BREAST EXCISIONAL BIOPSY Left yrs ago   benign  . THYROIDECTOMY     Dr. [name]  . VAGINAL DELIVERY     4    Prior to Admission medications   Medication Sig Start Date End Date Taking?  Authorizing Provider  amLODipine (NORVASC) 10 MG tablet TAKE 1 TABLET (10 MG TOTAL) BY MOUTH DAILY. 03/25/16   Jackolyn Confer, MD  aspirin 81 MG tablet Take 81 mg by mouth daily.    Historical Provider, MD  atorvastatin (LIPITOR) 20 MG tablet TAKE 1 TABLET (20 MG TOTAL) BY MOUTH DAILY. 08/05/16   Burnard Hawthorne, FNP  BOOSTRIX 5-2.5-18.5 injection  05/17/16   Historical Provider, MD  Calcium Carbonate-Vitamin D 600-400 MG-UNIT per tablet Take 1 tablet by mouth daily.    Historical Provider, MD  levothyroxine (SYNTHROID, LEVOTHROID) 137 MCG tablet Take 1 tablet (137 mcg total) by mouth daily before breakfast. 08/04/16   Burnard Hawthorne, FNP  losartan-hydrochlorothiazide (HYZAAR) 50-12.5 MG tablet Take 1 tablet by mouth daily. 10/07/16   Burnard Hawthorne, FNP  meloxicam (MOBIC) 15 MG tablet Take 1 tablet (15 mg total) by mouth daily. 02/11/16   Jackolyn Confer, MD  metFORMIN (GLUCOPHAGE) 500 MG tablet TAKE 1 TABLET (500 MG TOTAL) BY MOUTH 2 (TWO) TIMES DAILY WITH A MEAL. 07/25/16   Burnard Hawthorne, FNP  omeprazole (PRILOSEC) 20 MG capsule Take 1 capsule (20 mg total) by mouth daily. 06/23/16   Burnard Hawthorne, FNP  ONE TOUCH ULTRA TEST test strip TEST BLOOD SUGAR 1-2 TIMES A DAY 05/25/16   Burnard Hawthorne, FNP  [facility] DELICA LANCETS FINE MISC CHECK BLOOD GLUCOSE TWICE A DAY 05/25/16   Burnard Hawthorne, FNP    Allergies  Patient has no known allergies.  Family History  Problem Relation Age of Onset  . Hypertension Mother   . Hypertension Father   . Diabetes Sister     Social History Social History  Substance Use Topics  . Smoking status: Former Smoker    Years: 20.00    Types: Cigarettes    Quit date: 11/17/2012  . Smokeless tobacco: Former Systems developer  . Alcohol use No    Review of Systems Constitutional: No fever/chills Eyes: No visual changes. ENT: No sore throat. Cardiovascular:as above Respiratory: Denies shortness of breath. Gastrointestinal: No abdominal pain.  No nausea,  no vomiting.  No diarrhea.  No constipation. Genitourinary: Negative for dysuria. Musculoskeletal: Negative for back pain. Skin: Negative for rash. Neurological: Negative for headaches, focal weakness or numbness.  10-point ROS otherwise negative.  ____________________________________________   PHYSICAL EXAM:  VITAL SIGNS: ED Triage Vitals [10/09/16 1736]  Enc Vitals Group     BP (!) 147/81     Pulse Rate (!) 118     Resp 18     Temp 98.9 F (37.2 C)     Temp Source Oral     SpO2 98 %     Weight 200 lb (90.7 kg)     Height 5\' 8"  (1.727 m)     Head Circumference      Peak Flow      Pain Score 5     Pain Loc      Pain Edu?      Excl. in [place]?     Constitutional: Alert and oriented. Well appearing and in no acute distress. Eyes: Conjunctivae are normal. PERRL. EOMI. Head: Atraumatic. Nose: No congestion/rhinnorhea. Mouth/Throat: Mucous membranes are moist.  Oropharynx non-erythematous. Neck: No stridor.   Cardiovascular: Heart rate of 96 when I'm in the room. regular rhythm. Grossly normal heart sounds.  Good peripheral circulation. Respiratory: Normal respiratory effort.  No retractions. Lungs CTAB. Gastrointestinal: Soft and nontender. No distention.  Musculoskeletal: No lower extremity tenderness nor edema.  No joint effusions. Neurologic:  Normal speech and language. No gross focal neurologic deficits are appreciated. No gait instability. Skin:  Skin is warm, dry and intact. No rash noted. Psychiatric: Mood and affect are normal. Speech and behavior are normal.  ____________________________________________   LABS (all labs ordered are listed, but only abnormal results are displayed)  Labs Reviewed  BASIC METABOLIC PANEL - Abnormal; Notable for the following:       Result Value   Potassium 3.1 (*)    Chloride 100 (*)    Glucose, Bld 163 (*)    All other components within normal limits  CBC  TROPONIN I  TROPONIN I  FIBRIN DERIVATIVES D-DIMER ([facility] ONLY)    ____________________________________________  EKG  ED ECG REPORT I, Doran Stabler, the attending physician, personally viewed and interpreted this ECG.   Date: 10/09/2016  EKG Time: 1738  Rate: 118  Rhythm: sinus tachycardia  Axis: Normal  Intervals:none  ST&T Change: No ST segment elevation or depression. No abnormal T-wave inversion.  ____________________________________________  [id number]  DG Chest 2 View (Final result)  Result time 10/09/16 18:32:43  Final result by Earle Gell, MD (10/09/16 18:32:43)           Narrative:   CLINICAL DATA: Chest pain and sore throat.  EXAM: CHEST 2 VIEW  COMPARISON: None.  FINDINGS: The heart size and mediastinal contours are within normal limits. Both lungs are clear. No evidence of pneumothorax or pleural effusion. Mild thoracic spine degenerative  changes noted.  IMPRESSION: No active cardiopulmonary disease.   Electronically Signed By: Earle Gell M.D. On: 10/09/2016 18:32          ____________________________________________   PROCEDURES  Procedure(s) performed:   Procedures  Critical Care performed:   ____________________________________________   INITIAL IMPRESSION / ASSESSMENT AND PLAN / ED COURSE  Pertinent labs & imaging results that were available during my care of the patient were reviewed by me and considered in my medical decision making (see chart for details).  ----------------------------------------- 1130 PM on 10/09/2016 -----------------------------------------  Patient says that the pain is relieved after GI cocktail. Heart rate is now 91 bpm after fluids. Pending d-dimer at this time. 2 negative troponins. If d-dimer is negative the patient I believably safely discharged home. Will need outpatient follow-up. Signed out to Dr. Beather Arbour.      ____________________________________________   FINAL CLINICAL IMPRESSION(S) / ED DIAGNOSES  Chest pain. Lower extremity  pain.    NEW MEDICATIONS STARTED DURING THIS VISIT:  New Prescriptions   No medications on file     Note:  This document was prepared using Dragon voice recognition software and may include unintentional dictation errors.    Orbie Pyo, MD 10/10/16 Lupita Shutter

## 2016-10-09 NOTE — ED Notes (Addendum)
Pt. States upon waking today this a.m had hoarse throat.  Pt. Denies n/v/d.  Pt. States bilateral feet pain with "tingling sensation.  Pt. Is diabetic.  No lesions to feet, feet warm with no edema.

## 2016-10-09 NOTE — ED Triage Notes (Signed)
Pt awoke today with hoarse voice, burning to bilateral feet and heaviness to chest. Pt alert and oriented X4, active, cooperative, pt in NAD. RR even and unlabored, color WNL.

## 2016-10-10 ENCOUNTER — Emergency Department: Payer: Medicare Other

## 2016-10-10 ENCOUNTER — Encounter: Payer: Self-pay | Admitting: Radiology

## 2016-10-10 LAB — FIBRIN DERIVATIVES D-DIMER (ARMC ONLY): Fibrin derivatives D-dimer (ARMC): 687 — ABNORMAL HIGH (ref 0–499)

## 2016-10-10 IMAGING — CT CT ANGIO CHEST
2 of 6 series · 18 of 46 positions shown · IV contrast (APPLIED)
Comparison: [DATE] and dating back through [DATE] CT

CLINICAL DATA: Chest pain, leg pain and elevated D-dimer

EXAM:
CT ANGIOGRAPHY CHEST WITH CONTRAST
TECHNIQUE: Multidetector CT imaging of the chest was performed using the
standard protocol during bolus administration of intravenous
contrast. Multiplanar CT image reconstructions and MIPs were
obtained to evaluate the vascular anatomy.
CONTRAST:  75 cc of Isovue 370 IV

[Series 5: thins · axial · 0.72mm/px · z∈[-752,-478]mm · 16 of 302 slices shown]
[im 14/302  lung]
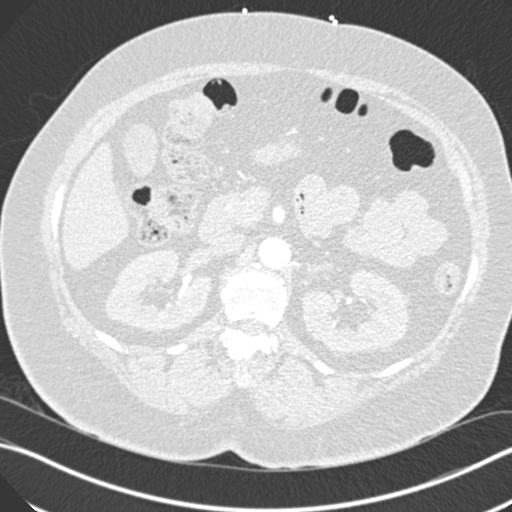
[im 40/302  soft-tissue]
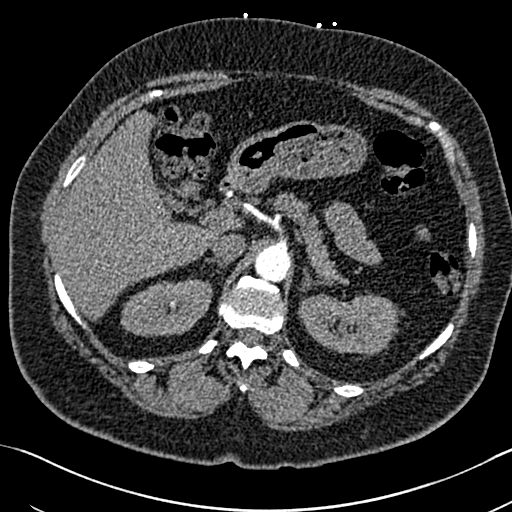
[im 53/302  lung]
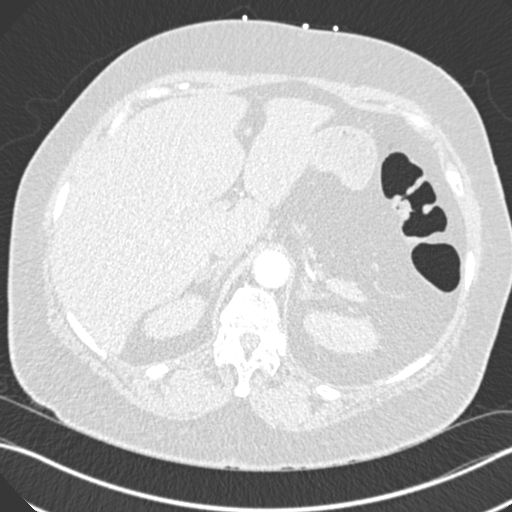
[im 66/302  soft-tissue]
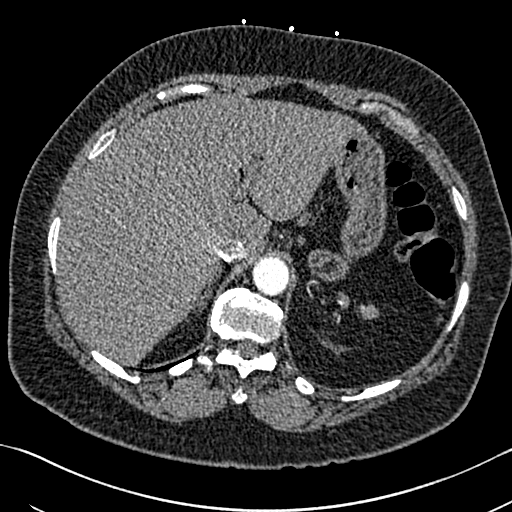
[im 92/302  lung]
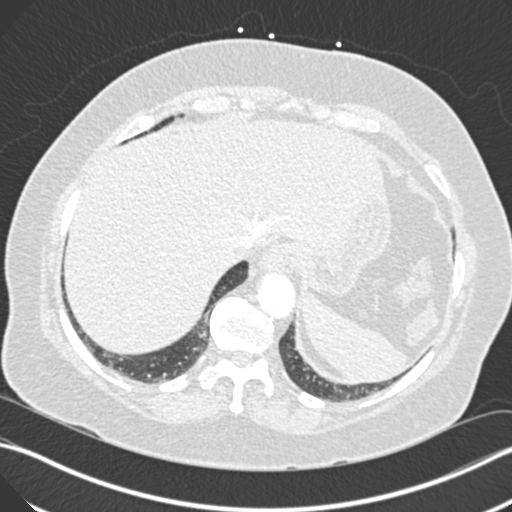
[im 105/302  soft-tissue]
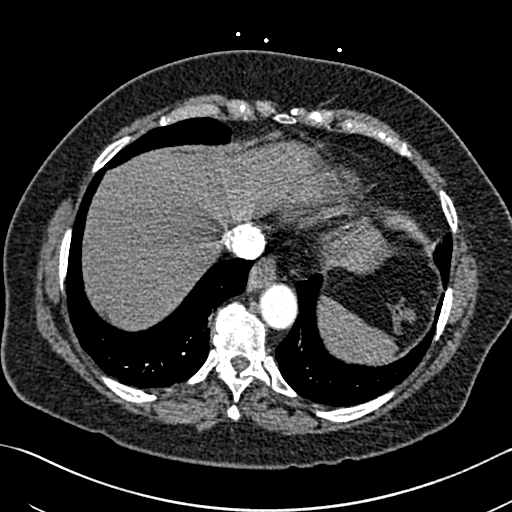
[im 118/302  lung]
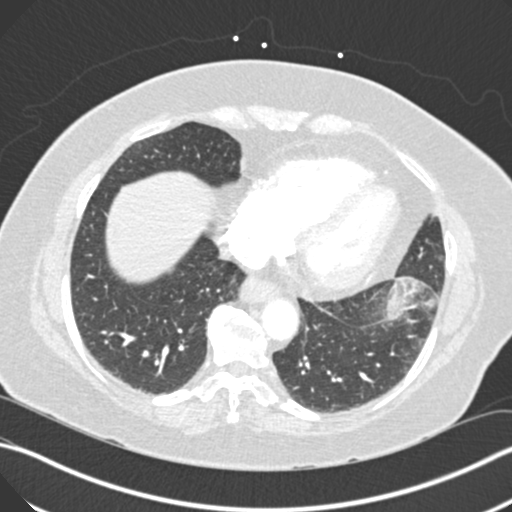
[im 144/302  soft-tissue]
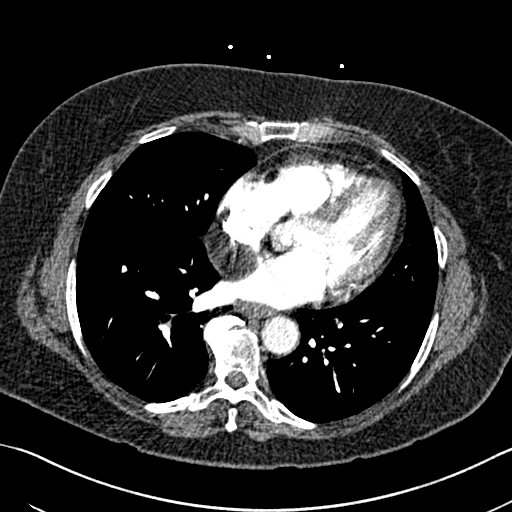
[im 158/302  lung]
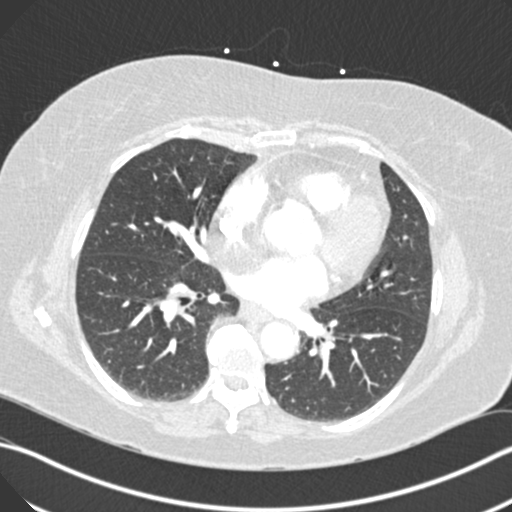
[im 184/302  soft-tissue]
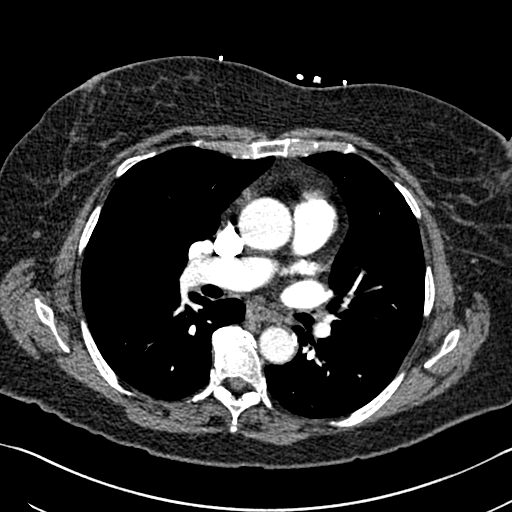
[im 197/302  lung]
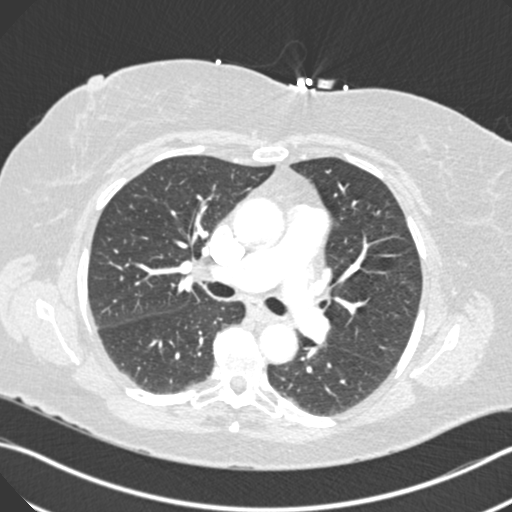
[im 210/302  soft-tissue]
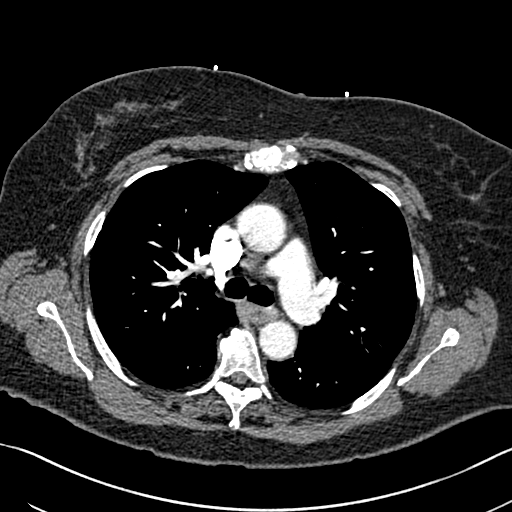
[im 236/302  lung]
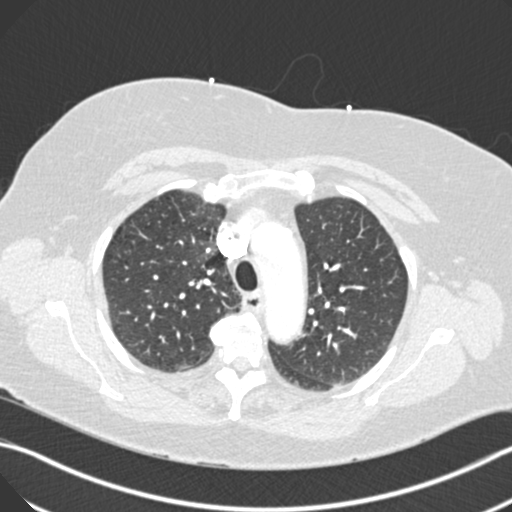
[im 249/302  soft-tissue]
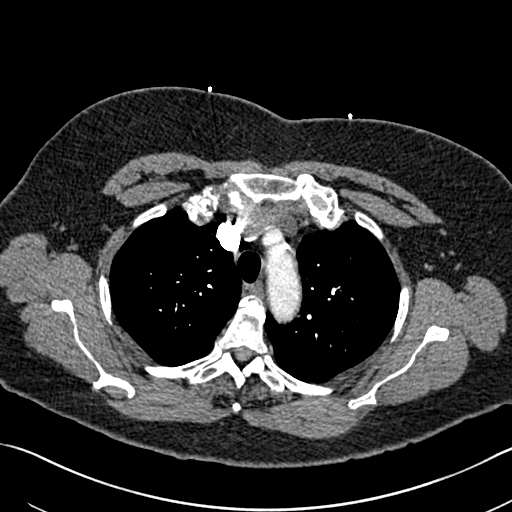
[im 262/302  lung]
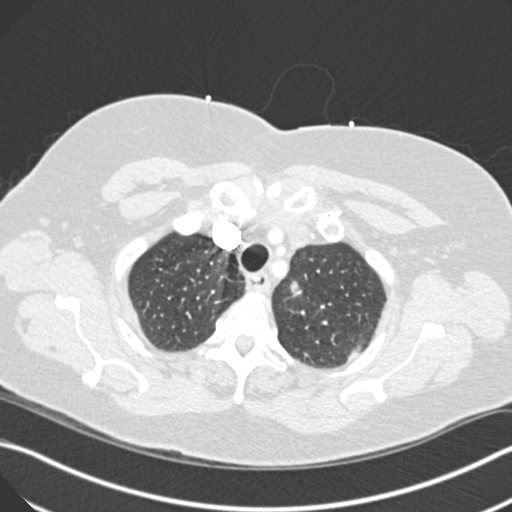
[im 288/302  soft-tissue]
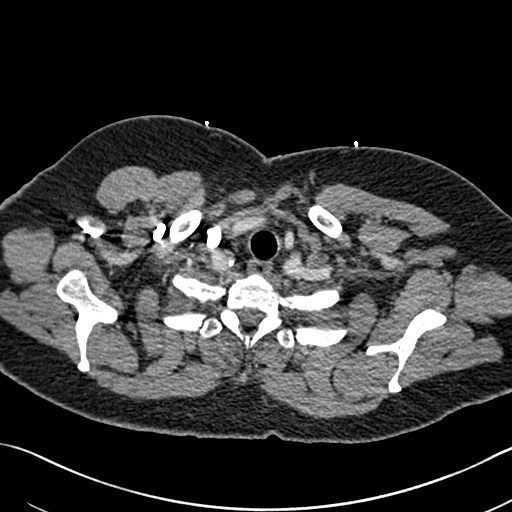

[Series 7: coronal mpr · coronal · 0.59mm/px · 2 of 106 slices shown]
[im 36/106  soft-tissue]
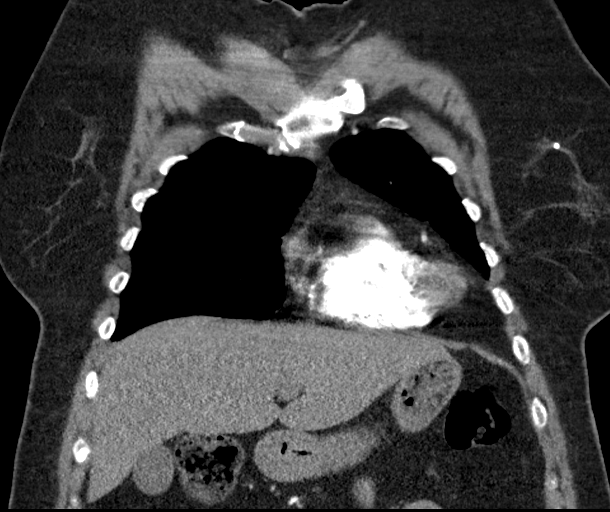
[im 71/106  soft-tissue]
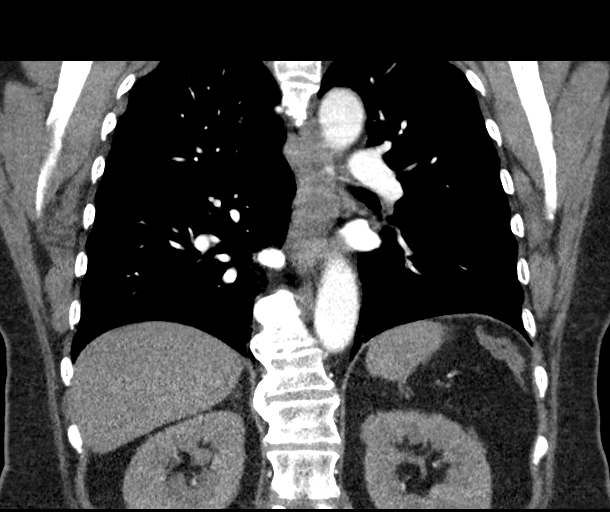

[18 of 46 positions shown; findings below may reference images not displayed]

FINDINGS: Cardiovascular: No acute pulmonary embolus. Ectasia of the ascending
aorta measuring up to 3.8 cm. No dissection. Cardiac chambers are
top-normal in size. No pericardial effusion. There is coronary
arteriosclerosis. Mild fatty prominence about the right atrium
possibly a cardiac lipoma.

Mediastinum/Nodes: No mediastinal adenopathy. The patient is status
post thyroidectomy. Trachea and mainstem bronchi are patent. No
esophageal abnormality noted.

Lungs/Pleura: Left upper lobe pulmonary nodule adjacent to the
aortic arch estimated 9 x 8 mm versus 9 x 6 mm on prior. Given its
relative long-term stability, this is likely a benign finding. Mild
centrilobular and paraseptal emphysematous change. No focal
consolidation. No effusion or pneumothorax.

Upper Abdomen: Nonacute. Mild thickening of the left adrenal gland
is unchanged.

Musculoskeletal: Degenerate change the visualized thoracolumbar
spine. Diffuse idiopathic skeletal hyperostosis of the dorsal spine
with enthesophytes.

Review of the MIP images confirms the above findings.
IMPRESSION: 1. No acute pulmonary embolus. Aortic atherosclerosis and coronary
arteriosclerosis. Mild circumferential fatty prominence about the
right atrium as before.
2. Minimally larger left upper lobe pulmonary nodule of this dates
back to [GC] likely reflecting a benign finding. This currently
measures 9 x 8 mm. Mild centrilobular and paraseptal emphysema.
3. Right atrial fatty focus consistent with a lipoma.

## 2016-10-10 MED ORDER — IOPAMIDOL (ISOVUE-370) INJECTION 76%
75.0000 mL | Freq: Once | INTRAVENOUS | Status: AC | PRN
  Administered 2016-10-10: 75 mL via INTRAVENOUS

## 2016-10-10 NOTE — Discharge Instructions (Addendum)
Continue all medications daily as prescribed by your doctor. Avoid foods that are heavy or spicy. Return to the ER for worsening symptoms, persistent vomiting, difficult breathing or other concerns.

## 2016-10-10 NOTE — ED Notes (Signed)
Going home with family.

## 2016-10-10 NOTE — ED Provider Notes (Signed)
-----------------------------------------   12:47 AM on 10/10/2016 -----------------------------------------  Assumed care of patient. D-dimer is mildly elevated. Will proceed with CTA chest to evaluate for PE.  ----------------------------------------- 2:21 AM on 10/10/2016 -----------------------------------------  CT chest interpreted per Dr. Randel Pigg:  1. No acute pulmonary embolus. Aortic atherosclerosis and coronary  arteriosclerosis. Mild circumferential fatty prominence about the  right atrium as before.  2. Minimally larger left upper lobe pulmonary nodule of this dates  back to 2014 likely reflecting a benign finding. This currently  measures 9 x 8 mm. Mild centrilobular and paraseptal emphysema.  3. 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   Updated patient and her family of CT scan negative for pulmonary embolus. Pain was relieved after GI cocktail. Patient had 2 sets of negative troponins, and now negative CT scan of her chest. She is not tachycardic. She will follow up closely with her PCP in one to 2 days. Strict return precautions given. Both verbalize understanding and agree with plan of care.   Paulette Blanch, MD 10/10/16 [phone]

## 2016-10-11 ENCOUNTER — Telehealth: Payer: Self-pay | Admitting: Gastroenterology

## 2016-10-11 ENCOUNTER — Telehealth: Payer: Self-pay | Admitting: *Deleted

## 2016-10-11 NOTE — Telephone Encounter (Signed)
Pt has requested a call to discuss her legs, she has a burning sensation in the front of her legs only , no swelling , no pain, no redness . Pt's legs started to feel better after applying lotion last night. Once the lotion wears off the sensation returns.  Pt contact [phone]

## 2016-10-11 NOTE — Telephone Encounter (Signed)
Noted! Thank you

## 2016-10-11 NOTE — Telephone Encounter (Signed)
See below message .10/11/16 Lm for patient to call for more info. Patient seen in ER 10/09/16

## 2016-10-11 NOTE — Telephone Encounter (Signed)
Patient called to cancel her colonoscopy 1/26 with Dr. Vicente Males. She has some other issues and test that she has to take care of first. She will call you back to reschedule when she is ready. I will call to let surgery know

## 2016-10-12 NOTE — Telephone Encounter (Signed)
Left message for patient to return call back.  

## 2016-10-12 NOTE — Telephone Encounter (Signed)
Call pt  Likely from longstanding diabetes or dry skin.   She may use lotion as she is doing or try OTC capsaicin cream for the sensation  If persists, please make ov  ALSO, she had ED visit this month. She needs appt to follow up.

## 2016-10-13 ENCOUNTER — Encounter: Payer: Self-pay | Admitting: Family

## 2016-10-13 ENCOUNTER — Ambulatory Visit (INDEPENDENT_AMBULATORY_CARE_PROVIDER_SITE_OTHER): Payer: Medicare Other | Admitting: Family

## 2016-10-13 DIAGNOSIS — I10 Essential (primary) hypertension: Secondary | ICD-10-CM | POA: Diagnosis not present

## 2016-10-13 DIAGNOSIS — E119 Type 2 diabetes mellitus without complications: Secondary | ICD-10-CM | POA: Diagnosis not present

## 2016-10-13 NOTE — Patient Instructions (Addendum)
Trial capsaicin cream over the counter.   Let me know if this doesn't help/

## 2016-10-13 NOTE — Progress Notes (Signed)
Pre visit review using our clinic review tool, if applicable. No additional management support is needed unless otherwise documented below in the visit note. 

## 2016-10-13 NOTE — Progress Notes (Signed)
Subjective:    Patient ID: Cynthia [name], female    DOB: 1946-05-13, 71 y.o.   MRN: [id number]  CC: Cynthia [name] is a 72 y.o. female who presents today for follow up.   HPI: CC: legs burning and 'on fire' which started 5 days ago, resolved today. No muscle aches. No wounds, swelling. Tried lotion with improvement which 'cooled them down.'    Denies exertional chest pain or pressure, numbness or tingling radiating to left arm or jaw, palpitations, dizziness, frequent headaches, changes in vision, or shortness of breath.   Doing well propranolol. No dizziness.   On lipitor.       Went to ED 1/21 cc: chest 'burning', negative trops. HR 118. EKG sinus tachycardia 10/09/16 CT Chest no embolus; showed atherosclerosis, LUL nodule 9 x 8 mm v 9 x 6 mm ( stable.) 10/11/16  Cardiology, Dr Cynthia [name]; pending stress test, echo; started metoprolol  HISTORY:  Past Medical History:  Diagnosis Date  . Arthritis   . Diabetes mellitus without complication ([place])   . Hyperlipidemia   . Hypertension   . Lung nodule   . Thyroid disease    Past Surgical History:  Procedure Laterality Date  . BREAST BIOPSY Left 11/25/2015   stereo results pending  . BREAST EXCISIONAL BIOPSY Left yrs ago   benign  . THYROIDECTOMY     Dr. Leanora [name]  . VAGINAL DELIVERY     4   Family History  Problem Relation Age of Onset  . Hypertension Mother   . Hypertension Father   . Diabetes Sister     Allergies: Patient has no known allergies. Current Outpatient Prescriptions on File Prior to Visit  Medication Sig Dispense Refill  . amLODipine (NORVASC) 10 MG tablet TAKE 1 TABLET (10 MG TOTAL) BY MOUTH DAILY. 90 tablet 3  . aspirin 81 MG tablet Take 81 mg by mouth daily.    Marland Kitchen atorvastatin (LIPITOR) 20 MG tablet TAKE 1 TABLET (20 MG TOTAL) BY MOUTH DAILY. 90 tablet 1  . BOOSTRIX 5-2.5-18.5 injection     . Calcium Carbonate-Vitamin D 600-400 MG-UNIT per tablet Take 1 tablet by mouth daily.    Marland Kitchen levothyroxine  (SYNTHROID, LEVOTHROID) 137 MCG tablet Take 1 tablet (137 mcg total) by mouth daily before breakfast. 90 tablet 1  . losartan-hydrochlorothiazide (HYZAAR) 50-12.5 MG tablet Take 1 tablet by mouth daily. 90 tablet 1  . meloxicam (MOBIC) 15 MG tablet Take 1 tablet (15 mg total) by mouth daily. 90 tablet 0  . metFORMIN (GLUCOPHAGE) 500 MG tablet TAKE 1 TABLET (500 MG TOTAL) BY MOUTH 2 (TWO) TIMES DAILY WITH A MEAL. 180 tablet 1  . omeprazole (PRILOSEC) 20 MG capsule Take 1 capsule (20 mg total) by mouth daily. 90 capsule 1  . ONE TOUCH ULTRA TEST test strip TEST BLOOD SUGAR 1-2 TIMES A DAY 100 each 4  . ONETOUCH DELICA LANCETS FINE MISC CHECK BLOOD GLUCOSE TWICE A DAY 100 each 3   No current facility-administered medications on file prior to visit.     Social History  Substance Use Topics  . Smoking status: Former Smoker    Years: 20.00    Types: Cigarettes    Quit date: 11/17/2012  . Smokeless tobacco: Former Systems developer  . Alcohol use No    Review of Systems  Constitutional: Negative for chills and fever.  Eyes: Negative for visual disturbance.  Respiratory: Negative for cough and shortness of breath.   Cardiovascular: Negative for chest pain and palpitations.  Gastrointestinal:  Negative for nausea and vomiting.  Neurological: Positive for numbness.      Objective:    BP 126/78   Pulse 70   Temp 98.1 F (36.7 C) (Oral)   Ht 5\' 8"  (1.727 m)   Wt 240 lb (108.9 kg)   SpO2 97%   BMI 36.49 kg/m  BP Readings from Last 3 Encounters:  10/13/16 126/78  10/10/16 112/85  08/15/16 132/88   Wt Readings from Last 3 Encounters:  10/13/16 240 lb (108.9 kg)  10/09/16 200 lb (90.7 kg)  08/15/16 240 lb 3.2 oz (109 kg)    Physical Exam  Constitutional: She appears well-developed and well-nourished.  Eyes: Conjunctivae are normal.  Cardiovascular: Normal rate, regular rhythm, normal heart sounds and normal pulses.   Pulmonary/Chest: Effort normal and breath sounds normal. She has no wheezes.  She has no rhonchi. She has no rales.  Neurological: She is alert.  Sensation intact. See diabetic foot exam.  Skin: Skin is warm and dry.  Psychiatric: She has a normal mood and affect. Her speech is normal and behavior is normal. Thought content normal.  Vitals reviewed.      Assessment & Plan:   Problem List Items Addressed This Visit      Cardiovascular and Mediastinum   Hypertension    HR 70. Dr Cynthia [name] 09/2016; pending stress test, echo. HR 70. Will follow      Relevant Medications   metoprolol tartrate (LOPRESSOR) 25 MG tablet     Endocrine   Diabetes mellitus, type 2 ([facility])    Last a1c at goal. 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 DM foot exam performed. Trial of conservative therapy OTC capsaicin. If no improvement, consider gabapentin. Also considering vitamin deficiencies and will check if symptoms persist.          I am having Cynthia [name] maintain her Calcium Carbonate-Vitamin D, aspirin, meloxicam, amLODipine, ONETOUCH DELICA LANCETS FINE, ONE TOUCH ULTRA TEST, BOOSTRIX, omeprazole, metFORMIN, levothyroxine, atorvastatin, losartan-hydrochlorothiazide, and metoprolol tartrate.   Meds ordered this encounter  Medications  . metoprolol tartrate (LOPRESSOR) 25 MG tablet    Return precautions given.   Risks, benefits, and alternatives of the medications and treatment plan prescribed today were discussed, and patient expressed understanding.   Education regarding symptom management and diagnosis given to patient on AVS.  Continue to follow with Cynthia Paris, FNP for routine health maintenance.   Cynthia [name] and I agreed with plan.   Cynthia Paris, FNP

## 2016-10-13 NOTE — Telephone Encounter (Signed)
Patient was informed at visit today.

## 2016-10-13 NOTE — Assessment & Plan Note (Signed)
Last a1c at goal. 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 DM foot exam performed. Trial of conservative therapy OTC capsaicin. If no improvement, consider gabapentin. Also considering vitamin deficiencies and will check if symptoms persist.

## 2016-10-13 NOTE — Assessment & Plan Note (Addendum)
HR 70. Dr Clayborn Bigness 09/2016; pending stress test, echo. HR 70. Will follow

## 2016-10-14 ENCOUNTER — Ambulatory Visit: Admission: RE | Admit: 2016-10-14 | Payer: Medicare Other | Source: Ambulatory Visit | Admitting: Gastroenterology

## 2016-10-14 ENCOUNTER — Encounter: Admission: RE | Payer: Self-pay | Source: Ambulatory Visit

## 2016-10-14 SURGERY — COLONOSCOPY WITH PROPOFOL
Anesthesia: General

## 2016-10-17 ENCOUNTER — Telehealth: Payer: Self-pay | Admitting: Family

## 2016-10-17 DIAGNOSIS — E1140 Type 2 diabetes mellitus with diabetic neuropathy, unspecified: Secondary | ICD-10-CM

## 2016-10-17 NOTE — Telephone Encounter (Signed)
Please advise 

## 2016-10-17 NOTE — Telephone Encounter (Signed)
Pt called and stated the scream that M. Arnett gave her is not helping it si making them burn. Please advise, thank you!  Call pt @ [phone]

## 2016-10-20 MED ORDER — GABAPENTIN 100 MG PO CAPS: 100.0000 mg | ORAL_CAPSULE | Freq: Three times a day (TID) | ORAL | 0 refills | Status: DC

## 2016-10-20 NOTE — Telephone Encounter (Signed)
Left message for patient to return call back.  

## 2016-10-20 NOTE — Telephone Encounter (Signed)
Call pt  Trial of gabapentin for nerve pain, suspect from DM  May make her sleepy so let me know if so.   F/u one-two months

## 2016-10-21 ENCOUNTER — Ambulatory Visit (INDEPENDENT_AMBULATORY_CARE_PROVIDER_SITE_OTHER): Payer: Medicare Other | Admitting: Cardiothoracic Surgery

## 2016-10-21 ENCOUNTER — Encounter: Payer: Self-pay | Admitting: Cardiothoracic Surgery

## 2016-10-21 VITALS — BP 149/89 | HR 79 | Temp 97.8°F | Resp 22 | Ht 68.0 in | Wt 239.4 lb

## 2016-10-21 DIAGNOSIS — R911 Solitary pulmonary nodule: Secondary | ICD-10-CM

## 2016-10-21 NOTE — Patient Instructions (Signed)
Please give us a call if you have any questions or concerns. 

## 2016-10-24 NOTE — Progress Notes (Signed)
  Patient ID: Cynthia [name], female   DOB: 1946-05-07, 71 y.o.   MRN: [id number]  HISTORY: She returns today in follow-up. She has no new complaints.   Vitals:   10/21/16 0906  BP: (!) 149/89  Pulse: 79  Resp: (!) 22  Temp: 97.8 F (36.6 C)     EXAM:    Resp: Lungs are clear bilaterally.  No respiratory distress, normal effort. Heart:  Regular without murmurs Abd:  Abdomen is soft, non distended and non tender. No masses are palpable.  There is no rebound and no guarding.  Neurological: Alert and oriented to person, place, and time. Coordination normal.  Skin: Skin is warm and dry. No rash noted. No diaphoretic. No erythema. No pallor.  Psychiatric: Normal mood and affect. Normal behavior. Judgment and thought content normal.    ASSESSMENT: I have independently reviewed the patient's CT scan. There is no change in the left upper lobe pulmonary nodule.   PLAN:   I have reviewed the results of the CT scan. They state that there is minimal change in the left upper lobe nodule suggesting benign disease. Therefore no further follow-up imaging was suggested. We will see the patient back as needed.    Nestor Lewandowsky, MD

## 2016-11-15 ENCOUNTER — Ambulatory Visit (INDEPENDENT_AMBULATORY_CARE_PROVIDER_SITE_OTHER): Payer: Medicare Other

## 2016-11-15 ENCOUNTER — Encounter: Payer: Self-pay | Admitting: Family

## 2016-11-15 ENCOUNTER — Ambulatory Visit (INDEPENDENT_AMBULATORY_CARE_PROVIDER_SITE_OTHER): Payer: Medicare Other | Admitting: Family

## 2016-11-15 VITALS — BP 130/78 | HR 94 | Temp 98.1°F | Ht 68.0 in | Wt 241.0 lb

## 2016-11-15 DIAGNOSIS — M25552 Pain in left hip: Secondary | ICD-10-CM | POA: Diagnosis not present

## 2016-11-15 DIAGNOSIS — E1140 Type 2 diabetes mellitus with diabetic neuropathy, unspecified: Secondary | ICD-10-CM | POA: Diagnosis not present

## 2016-11-15 DIAGNOSIS — M1711 Unilateral primary osteoarthritis, right knee: Secondary | ICD-10-CM

## 2016-11-15 LAB — POCT URINALYSIS DIPSTICK
Bilirubin, UA: NEGATIVE
Glucose, UA: NEGATIVE
Ketones, UA: NEGATIVE
Leukocytes, UA: NEGATIVE
NITRITE UA: NEGATIVE
PH UA: 5
Protein, UA: NEGATIVE
RBC UA: NEGATIVE
SPEC GRAV UA: 1.01
UROBILINOGEN UA: 0.2

## 2016-11-15 IMAGING — DX DG HIP (WITH OR WITHOUT PELVIS) 2-3V*L*
3 series · 3 of 3 positions shown · non-contrast
Comparison: None in PACs

CLINICAL DATA: Left hip pain.  History of diabetes

EXAM:
DG HIP (WITH OR WITHOUT PELVIS) 2-3V LEFT

[pelvis ap]
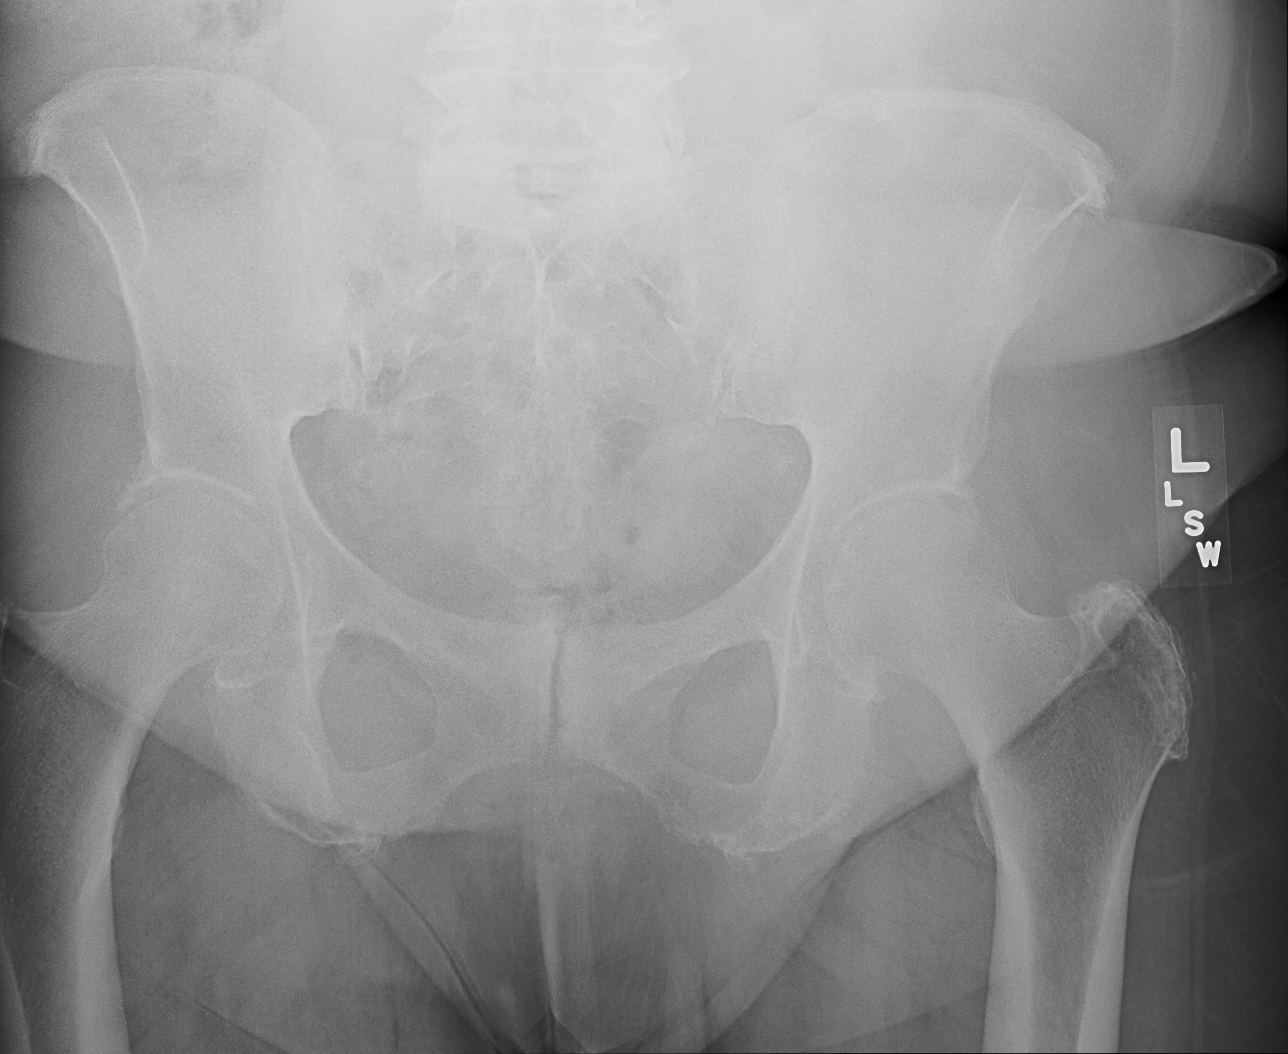

[hip joint ap]
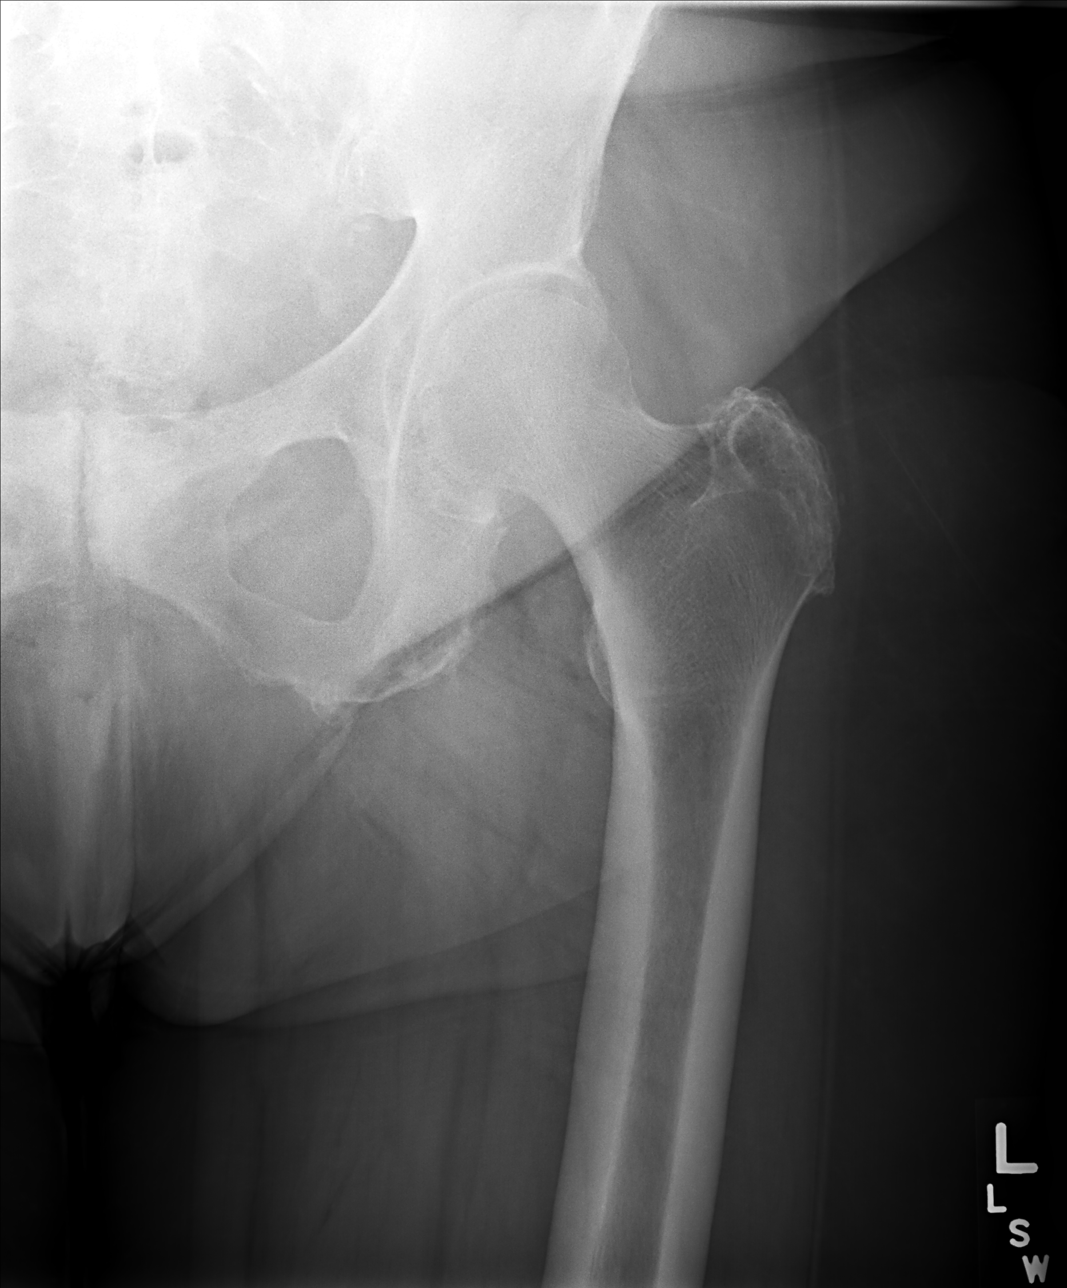

[ap frog obl (oblique)]
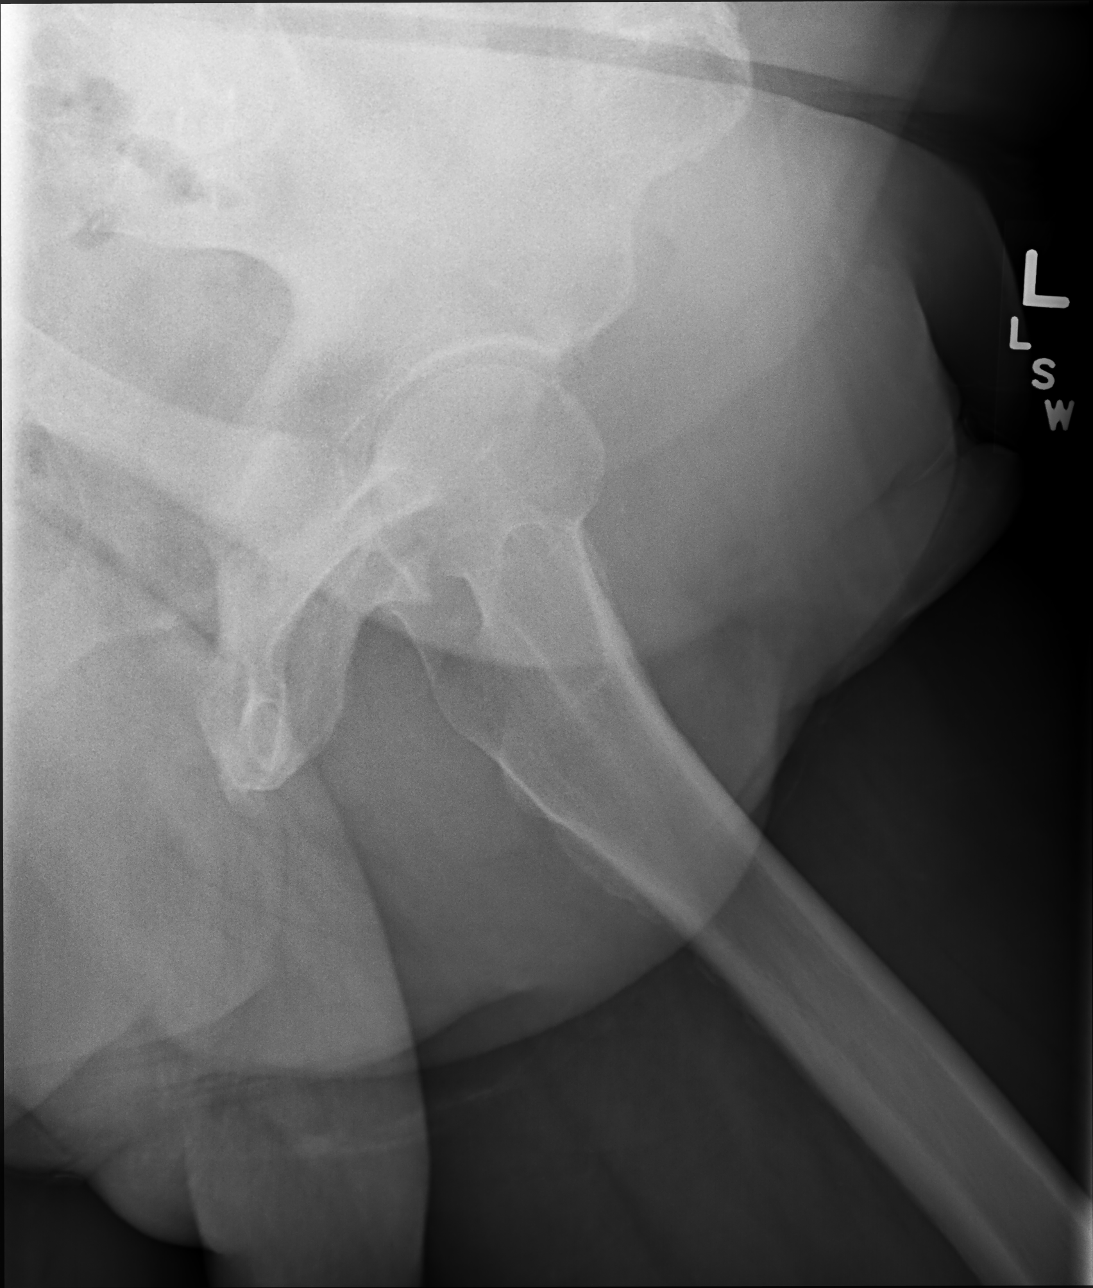

[3 of 3 positions shown; findings below may reference images not displayed]

FINDINGS: The bones are subjectively mildly osteopenic. No lytic or blastic
pelvic lesion is observed. No acute pelvic fracture is demonstrated.
There degenerative changes at the lumbosacral junction.

AP and lateral views of the left hip reveal mild symmetric narrowing
of the joint space. The articular surfaces of the femoral head and
acetabulum remains smoothly rounded. The femoral neck,
intertrochanteric, and subtrochanteric regions are normal.
IMPRESSION: Mild degenerative joint space loss of the left hip. No acute or
significant chronic bony abnormality of the left hip.

## 2016-11-15 MED ORDER — PREDNISONE 10 MG PO TABS
ORAL_TABLET | ORAL | 0 refills | Status: DC
Start: 2016-11-15 — End: 2017-07-18

## 2016-11-15 MED ORDER — GABAPENTIN 100 MG PO CAPS
100.0000 mg | ORAL_CAPSULE | Freq: Three times a day (TID) | ORAL | 0 refills | Status: DC
Start: 2016-11-15 — End: 2016-12-20

## 2016-11-15 MED ORDER — OMEPRAZOLE 20 MG PO CPDR: 20.0000 mg | DELAYED_RELEASE_CAPSULE | Freq: Every day | ORAL | 1 refills | Status: DC

## 2016-11-15 NOTE — Patient Instructions (Signed)
Start prednisone tomorrow  Tonight use mobic and ice.   Suspect trochanteric bursitis  Trochanteric Bursitis Trochanteric bursitis is a condition that causes hip pain. Trochanteric bursitis happens when fluid-filled sacs (bursae) in the hip get irritated. Normally these sacs absorb shock and help strong bands of tissue (tendons) in your hip glide smoothly over each other and over your hip bones. What are the causes? This condition results from increased friction between the hip bones and the tendons that go over them. This condition can happen if you:  Have weak hips.  Use your hip muscles too much (overuse).  Get hit in the hip. What increases the risk? This condition is more likely to develop in:  Women.  Adults who are middle-aged or older.  People with arthritis or a spinal condition.  People with weak buttocks muscles (gluteal muscles).  People who have one leg that is shorter than the other.  People who participate in certain kinds of athletic activities, such as:  Running sports, especially long-distance running.  Contact sports, like football or martial arts.  Sports in which falls may occur, like skiing. What are the signs or symptoms? The main symptom of this condition is pain and tenderness over the point of your hip. The pain may be:  Sharp and intense.  Dull and achy.  Felt on the outside of your thigh. It may increase when you:  Lie on your side.  Walk or run.  Go up on stairs.  Sit.  Stand up after sitting.  Stand for long periods of time. How is this diagnosed? This condition may be diagnosed based on:  Your symptoms.  Your medical history.  A physical exam.  Imaging tests, such as:  X-rays to check your bones.  An MRI or ultrasound to check your tendons and muscles. During your physical exam, your health care provider will check the movement and strength of your hip. He or she may press on the point of your hip to check for  pain. How is this treated? This condition may be treated by:  Resting.  Reducing your activity.  Avoiding activities that cause pain.  Using crutches, a cane, or a Roads to decrease the strain on your hip.  Taking medicine to help with swelling.  Having medicine injected into the bursae to help with swelling.  Using ice, heat, and massage therapy for pain relief.  Physical therapy exercises for strength and flexibility.  Surgery (rare). Follow these instructions at home: Activity   Rest.  Avoid activities that cause pain.  Return to your normal activities as told by your health care provider. Ask your health care provider what activities are safe for you. Managing pain, stiffness, and swelling   Take over-the-counter and prescription medicines only as told by your health care provider.  If directed, apply heat to the injured area as told by your health care provider.  Place a towel between your skin and the heat source.  Leave the heat on for 20-30 minutes.  Remove the heat if your skin turns bright red. This is especially important if you are unable to feel pain, heat, or cold. You may have a greater risk of getting burned.  If directed, apply ice to the injured area:  Put ice in a plastic bag.  Place a towel between your skin and the bag.  Leave the ice on for 20 minutes, 2-3 times a day. General instructions   If the affected leg is one that you use for driving,  ask your health care provider when it is safe to drive.  Use crutches, a cane, or a Stitzer as told by your health care provider.  If one of your legs is shorter than the other, get fitted for a shoe insert.  Lose weight if you are overweight. How is this prevented?  Wear supportive footwear that is appropriate for your sport.  If you have hip pain, start any new exercise or sport slowly.  Maintain physical fitness, including:  Strength.  Flexibility. Contact a health care provider  if:  Your pain does not improve with 2-4 weeks. Get help right away if:  You develop severe pain.  You have a fever.  You develop increased redness over your hip.  You have a change in your bowel function or bladder function.  You cannot control the muscles in your feet. This information is not intended to replace advice given to you by your health care provider. Make sure you discuss any questions you have with your health care provider. Document Released: 10/13/2004 Document Revised: 05/11/2016 Document Reviewed: 08/21/2015 Elsevier Interactive Patient Education  2017 [employer].

## 2016-11-15 NOTE — Progress Notes (Signed)
Pre visit review using our clinic review tool, if applicable. No additional management support is needed unless otherwise documented below in the visit note. 

## 2016-11-15 NOTE — Progress Notes (Signed)
Subjective:    Patient ID: Cynthia [name], female    DOB: [birth year], 71 y.o.   MRN: [id number]  CC: Cynthia [name] is a 71 y.o. female who presents today for an acute visit.    HPI: CC: left hip pain for past 6 days, last night worsening and pain awoke her from sleep. First started in low back and now radiating to left side of leg. Cannot sleep on left side of hip.  Endorses low back pain. Hasn't been exercising. No injury. No numbness or tingling in legs.   No fever, dysuria, flank pain.   Mobic without relief. Ice with little relief.   Mobic started for knee pain. Gabapentin for burning in feet.   Requesting refill for gabapentin, omeprazole.    HISTORY:  Past Medical History:  Diagnosis Date  . Arthritis   . Diabetes mellitus without complication ([place])   . Hyperlipidemia   . Hypertension   . Lung nodule   . Thyroid disease    Past Surgical History:  Procedure Laterality Date  . BREAST BIOPSY Left 11/25/2015   stereo results pending  . BREAST EXCISIONAL BIOPSY Left yrs ago   benign  . THYROIDECTOMY     Dr. [name]  . VAGINAL DELIVERY     4   Family History  Problem Relation Age of Onset  . Hypertension Mother   . Hypertension Father   . Diabetes Sister     Allergies: Patient has no known allergies. Current Outpatient Prescriptions on File Prior to Visit  Medication Sig Dispense Refill  . amLODipine (NORVASC) 10 MG tablet TAKE 1 TABLET (10 MG TOTAL) BY MOUTH DAILY. 90 tablet 3  . aspirin 81 MG tablet Take 81 mg by mouth daily.    Marland Kitchen atorvastatin (LIPITOR) 20 MG tablet TAKE 1 TABLET (20 MG TOTAL) BY MOUTH DAILY. 90 tablet 1  . BOOSTRIX 5-2.5-18.5 injection     . Calcium Carbonate-Vitamin D 600-400 MG-UNIT per tablet Take 1 tablet by mouth daily.    Marland Kitchen gabapentin (NEURONTIN) 100 MG capsule Take 1 capsule (100 mg total) by mouth 3 (three) times daily. 90 capsule 0  . levothyroxine (SYNTHROID, LEVOTHROID) 137 MCG tablet Take 1 tablet (137 mcg total) by mouth  daily before breakfast. 90 tablet 1  . losartan-hydrochlorothiazide (HYZAAR) 50-12.5 MG tablet Take 1 tablet by mouth daily. 90 tablet 1  . meloxicam (MOBIC) 15 MG tablet Take 1 tablet (15 mg total) by mouth daily. 90 tablet 0  . metFORMIN (GLUCOPHAGE) 500 MG tablet TAKE 1 TABLET (500 MG TOTAL) BY MOUTH 2 (TWO) TIMES DAILY WITH A MEAL. 180 tablet 1  . metoprolol tartrate (LOPRESSOR) 25 MG tablet     . omeprazole (PRILOSEC) 20 MG capsule Take 1 capsule (20 mg total) by mouth daily. 90 capsule 1  . ONE TOUCH ULTRA TEST test strip TEST BLOOD SUGAR 1-2 TIMES A DAY 100 each 4  . ONETOUCH DELICA LANCETS FINE MISC CHECK BLOOD GLUCOSE TWICE A DAY 100 each 3   No current facility-administered medications on file prior to visit.     Social History  Substance Use Topics  . Smoking status: Former Smoker    Years: 20.00    Types: Cigarettes    Quit date: 11/17/2012  . Smokeless tobacco: Former Systems developer  . Alcohol use No    Review of Systems  Constitutional: Negative for chills and fever.  Respiratory: Negative for cough.   Cardiovascular: Negative for chest pain and palpitations.  Gastrointestinal: Negative for nausea  and vomiting.      Objective:    BP 130/78   Pulse 94   Temp 98.1 F (36.7 C) (Oral)   Ht 5\' 8"  (1.727 m)   Wt 241 lb (109.3 kg)   SpO2 96%   BMI 36.64 kg/m    Physical Exam  Constitutional: She appears well-developed and well-nourished.  Eyes: Conjunctivae are normal.  Cardiovascular: Normal rate, regular rhythm, normal heart sounds and normal pulses.   Pulmonary/Chest: Effort normal and breath sounds normal. She has no wheezes. She has no rhonchi. She has no rales.  Musculoskeletal:       Left hip: She exhibits tenderness. She exhibits normal range of motion, normal strength, no bony tenderness and no swelling.  Left Hip: No limp or waddling gait. Full ROM with flexion and hip rotation in flexion.  Pain of lateral hip with  (flexion-abduction-external rotation) test.  Pain with deep palpation of greater trochanter.    Neurological: She is alert.  Skin: Skin is warm and dry.  Psychiatric: She has a normal mood and affect. Her speech is normal and behavior is normal. Thought content normal.  Vitals reviewed.      Assessment & Plan:   Problem List Items Addressed This Visit      Endocrine   Type 2 diabetes mellitus with diabetic neuropathy, unspecified ([place])   Relevant Medications   gabapentin (NEURONTIN) 100 MG capsule     Other   Left hip pain - Primary    Symptoms most consistent with trochanteric bursitis. Pending x-rays. Trial of prednisone.      Relevant Medications   predniSONE (DELTASONE) 10 MG tablet   Other Relevant Orders   POCT urinalysis dipstick   XR HIP UNILAT W OR W/O PELVIS 2-3 VIEWS LEFT   DG HIP UNILAT WITH PELVIS 2-3 VIEWS LEFT    Other Visit Diagnoses    Osteoarthritis of right knee, unspecified osteoarthritis type       Relevant Medications   predniSONE (DELTASONE) 10 MG tablet   omeprazole (PRILOSEC) 20 MG capsule          I am having Cynthia [name] maintain her Calcium Carbonate-Vitamin D, aspirin, meloxicam, amLODipine, ONETOUCH DELICA LANCETS FINE, ONE TOUCH ULTRA TEST, BOOSTRIX, omeprazole, metFORMIN, levothyroxine, atorvastatin, losartan-hydrochlorothiazide, metoprolol tartrate, and gabapentin.   No orders of the defined types were placed in this encounter.   Return precautions given.   Risks, benefits, and alternatives of the medications and treatment plan prescribed today were discussed, and patient expressed understanding.   Education regarding symptom management and diagnosis given to patient on AVS.  Continue to follow with Mable Paris, FNP for routine health maintenance.   Cynthia [name] and I agreed with plan.   Mable Paris, FNP

## 2016-11-15 NOTE — Assessment & Plan Note (Signed)
Symptoms most consistent with trochanteric bursitis. Pending x-rays. Trial of prednisone.

## 2016-11-17 ENCOUNTER — Ambulatory Visit: Payer: Self-pay | Admitting: Family

## 2016-11-21 ENCOUNTER — Telehealth: Payer: Self-pay

## 2016-11-21 NOTE — Telephone Encounter (Signed)
Noted thanks °

## 2016-11-21 NOTE — Telephone Encounter (Signed)
LMTCB

## 2016-11-21 NOTE — Telephone Encounter (Signed)
Spoke with pt and she stated that she she is not having any urinary symptoms. She also stated that her back is not hurting as bad as it was.

## 2016-11-21 NOTE — Telephone Encounter (Signed)
-----   Message from Burnard Hawthorne, [place] sent at 11/21/2016  8:59 AM EST ----- CALL-  Normal urine. Suspect back pain is NOT related to UTI.  No urine culture collected- if having urinary symptoms, we need to recheck urine.    Let me know if cannot reach patient.

## 2016-11-21 NOTE — Telephone Encounter (Signed)
Please call pt at  [phone]

## 2016-12-17 ENCOUNTER — Other Ambulatory Visit: Payer: Self-pay | Admitting: Family

## 2016-12-17 DIAGNOSIS — E1140 Type 2 diabetes mellitus with diabetic neuropathy, unspecified: Secondary | ICD-10-CM

## 2016-12-20 ENCOUNTER — Telehealth: Payer: Self-pay | Admitting: *Deleted

## 2016-12-20 ENCOUNTER — Other Ambulatory Visit: Payer: Self-pay

## 2016-12-20 DIAGNOSIS — E1140 Type 2 diabetes mellitus with diabetic neuropathy, unspecified: Secondary | ICD-10-CM

## 2016-12-20 MED ORDER — GABAPENTIN 100 MG PO CAPS: 100.0000 mg | ORAL_CAPSULE | Freq: Three times a day (TID) | ORAL | 0 refills | Status: DC

## 2016-12-20 NOTE — Telephone Encounter (Signed)
Requested medication refill for :Gabapentin  Pharmacy:[facility] Please Contact Pt when ready or sent to Pharmacy:   [phone]

## 2017-01-16 ENCOUNTER — Other Ambulatory Visit: Payer: Self-pay | Admitting: Family

## 2017-01-16 DIAGNOSIS — E1140 Type 2 diabetes mellitus with diabetic neuropathy, unspecified: Secondary | ICD-10-CM

## 2017-01-16 DIAGNOSIS — E039 Hypothyroidism, unspecified: Secondary | ICD-10-CM

## 2017-01-18 NOTE — Telephone Encounter (Signed)
Pt called to follow up on her medication refill. Pt is running low on her medication.   Call pt @ [phone]. Thank you!

## 2017-01-21 ENCOUNTER — Other Ambulatory Visit: Payer: Self-pay | Admitting: Family

## 2017-02-15 ENCOUNTER — Other Ambulatory Visit: Payer: Self-pay | Admitting: Family

## 2017-02-15 ENCOUNTER — Ambulatory Visit (INDEPENDENT_AMBULATORY_CARE_PROVIDER_SITE_OTHER): Payer: Medicare Other | Admitting: Family

## 2017-02-15 ENCOUNTER — Encounter: Payer: Self-pay | Admitting: Family

## 2017-02-15 VITALS — BP 116/71 | HR 90 | Temp 98.2°F | Ht 68.0 in | Wt 245.4 lb

## 2017-02-15 DIAGNOSIS — E039 Hypothyroidism, unspecified: Secondary | ICD-10-CM

## 2017-02-15 DIAGNOSIS — Z1231 Encounter for screening mammogram for malignant neoplasm of breast: Secondary | ICD-10-CM | POA: Diagnosis not present

## 2017-02-15 DIAGNOSIS — E1140 Type 2 diabetes mellitus with diabetic neuropathy, unspecified: Secondary | ICD-10-CM

## 2017-02-15 DIAGNOSIS — Z1211 Encounter for screening for malignant neoplasm of colon: Secondary | ICD-10-CM | POA: Diagnosis not present

## 2017-02-15 DIAGNOSIS — I10 Essential (primary) hypertension: Secondary | ICD-10-CM

## 2017-02-15 DIAGNOSIS — J40 Bronchitis, not specified as acute or chronic: Secondary | ICD-10-CM

## 2017-02-15 DIAGNOSIS — Z1239 Encounter for other screening for malignant neoplasm of breast: Secondary | ICD-10-CM

## 2017-02-15 LAB — COMPREHENSIVE METABOLIC PANEL
ALT: 23 U/L (ref 0–35)
AST: 22 U/L (ref 0–37)
Albumin: 4.4 g/dL (ref 3.5–5.2)
Alkaline Phosphatase: 64 U/L (ref 39–117)
BILIRUBIN TOTAL: 0.5 mg/dL (ref 0.2–1.2)
BUN: 15 mg/dL (ref 6–23)
CO2: 30 meq/L (ref 19–32)
CREATININE: 0.79 mg/dL (ref 0.40–1.20)
Calcium: 10.1 mg/dL (ref 8.4–10.5)
Chloride: 101 mEq/L (ref 96–112)
GFR: 92.17 mL/min (ref >60.00)
GLUCOSE: 113 mg/dL — AB (ref 70–99)
Potassium: 3.8 mEq/L (ref 3.5–5.1)
SODIUM: 138 meq/L (ref 135–145)
Total Protein: 7.5 g/dL (ref 6.0–8.3)

## 2017-02-15 MED ORDER — METOPROLOL TARTRATE 25 MG PO TABS: 25.0000 mg | ORAL_TABLET | Freq: Two times a day (BID) | ORAL | 1 refills | Status: DC

## 2017-02-15 MED ORDER — BENZONATATE 100 MG PO CAPS: 100.0000 mg | ORAL_CAPSULE | Freq: Two times a day (BID) | ORAL | 0 refills | Status: DC | PRN

## 2017-02-15 MED ORDER — GLUCOSE BLOOD VI STRP: ORAL_STRIP | 4 refills | Status: DC

## 2017-02-15 MED ORDER — AMLODIPINE BESYLATE 10 MG PO TABS: ORAL_TABLET | ORAL | 3 refills | Status: DC

## 2017-02-15 MED ORDER — MELOXICAM 15 MG PO TABS: 15.0000 mg | ORAL_TABLET | Freq: Every day | ORAL | 0 refills | Status: DC

## 2017-02-15 MED ORDER — ONETOUCH DELICA LANCETS FINE MISC: 3 refills | Status: DC

## 2017-02-15 MED ORDER — LOSARTAN POTASSIUM-HCTZ 50-12.5 MG PO TABS: 1.0000 | ORAL_TABLET | Freq: Every day | ORAL | 1 refills | Status: DC

## 2017-02-15 NOTE — Progress Notes (Signed)
Pre visit review using our clinic review tool, if applicable. No additional management support is needed unless otherwise documented below in the visit note. 

## 2017-02-15 NOTE — Patient Instructions (Signed)
start plain Mucinex twice a day. Ensure you're taking plenty of water at least 1-2 glasses with each tablet. Tessalon Perles as needed for cough Let Me know if not better Labs

## 2017-02-15 NOTE — Assessment & Plan Note (Signed)
Stable. Pending a1c

## 2017-02-15 NOTE — Assessment & Plan Note (Signed)
ordered

## 2017-02-15 NOTE — Progress Notes (Signed)
Subjective:    Patient ID: Cynthia [name], female    DOB: 1946-06-19, 71 y.o.   MRN: [id number]  CC: Cynthia [name] is a 71 y.o. female who presents today for follow up.   HPI:  Complains of congestion x one week, unchanged. Productive cough, cream color. No wheeze, SOB, fever, sinus pain. Concerned was husband is having surgery next week, and wants to be better. Taking cough and cold hbp with some relief.   DM- compliant with medication. Fasting blood sugar 115 on average  HTN- compliant with medications Denies exertional chest pain or pressure, numbness or tingling radiating to left arm or jaw, palpitations, dizziness, frequent headaches, changes in vision, or shortness of breath.        HISTORY:  Past Medical History:  Diagnosis Date  . Arthritis   . Diabetes mellitus without complication ([place])   . Hyperlipidemia   . Hypertension   . Lung nodule   . Thyroid disease    Past Surgical History:  Procedure Laterality Date  . BREAST BIOPSY Left 11/25/2015   stereo results pending  . BREAST EXCISIONAL BIOPSY Left yrs ago   benign  . THYROIDECTOMY     Dr. [name]  . VAGINAL DELIVERY     4   Family History  Problem Relation Age of Onset  . Hypertension Mother   . Hypertension Father   . Diabetes Sister     Allergies: Patient has no known allergies. Current Outpatient Prescriptions on File Prior to Visit  Medication Sig Dispense Refill  . aspirin 81 MG tablet Take 81 mg by mouth daily.    Marland Kitchen atorvastatin (LIPITOR) 20 MG tablet TAKE 1 TABLET (20 MG TOTAL) BY MOUTH DAILY. 90 tablet 1  . BOOSTRIX 5-2.5-18.5 injection     . Calcium Carbonate-Vitamin D 600-400 MG-UNIT per tablet Take 1 tablet by mouth daily.    Marland Kitchen gabapentin (NEURONTIN) 100 MG capsule TAKE 1 CAPSULE (100 MG TOTAL) BY MOUTH 3 (THREE) TIMES DAILY. 90 capsule 0  . levothyroxine (SYNTHROID, LEVOTHROID) 137 MCG tablet TAKE 1 TABLET (137 MCG TOTAL) BY MOUTH DAILY BEFORE BREAKFAST. 90 tablet 1  . metFORMIN  (GLUCOPHAGE) 500 MG tablet TAKE 1 TABLET (500 MG TOTAL) BY MOUTH 2 (TWO) TIMES DAILY WITH A MEAL. 180 tablet 1  . omeprazole (PRILOSEC) 20 MG capsule Take 1 capsule (20 mg total) by mouth daily. 90 capsule 1  . predniSONE (DELTASONE) 10 MG tablet Take 40 mg by mouth on day 1, then taper 10 mg daily until gone 10 tablet 0   No current facility-administered medications on file prior to visit.     Social History  Substance Use Topics  . Smoking status: Former Smoker    Years: 20.00    Types: Cigarettes    Quit date: 11/17/2012  . Smokeless tobacco: Former Systems developer  . Alcohol use No    Review of Systems  Constitutional: Negative for chills and fever.  HENT: Positive for congestion. Negative for ear pain, sinus pressure and sore throat.   Respiratory: Positive for cough. Negative for shortness of breath and wheezing.   Cardiovascular: Negative for chest pain and palpitations.  Gastrointestinal: Negative for nausea and vomiting.      Objective:    BP 116/71   Pulse 90   Temp 98.2 F (36.8 C) (Oral)   Ht 5\' 8"  (1.727 m)   Wt 245 lb 6.4 oz (111.3 kg)   SpO2 97%   BMI 37.31 kg/m  BP Readings from Last 3  Encounters:  02/15/17 116/71  11/15/16 130/78  10/21/16 (!) 149/89   Wt Readings from Last 3 Encounters:  02/15/17 245 lb 6.4 oz (111.3 kg)  11/15/16 241 lb (109.3 kg)  10/21/16 239 lb 6.4 oz (108.6 kg)    Physical Exam  Constitutional: She appears well-developed and well-nourished.  HENT:  Head: Normocephalic and atraumatic.  Right Ear: Hearing, tympanic membrane, external ear and ear canal normal. No drainage, swelling or tenderness. No foreign bodies. Tympanic membrane is not erythematous and not bulging. No middle ear effusion. No decreased hearing is noted.  Left Ear: Hearing, tympanic membrane, external ear and ear canal normal. No drainage, swelling or tenderness. No foreign bodies. Tympanic membrane is not erythematous and not bulging.  No middle ear effusion. No decreased  hearing is noted.  Nose: Nose normal. No rhinorrhea. Right sinus exhibits no maxillary sinus tenderness and no frontal sinus tenderness. Left sinus exhibits no maxillary sinus tenderness and no frontal sinus tenderness.  Mouth/Throat: Uvula is midline, oropharynx is clear and moist and mucous membranes are normal. No oropharyngeal exudate, posterior oropharyngeal edema, posterior oropharyngeal erythema or tonsillar abscesses.  Eyes: Conjunctivae are normal.  Cardiovascular: Regular rhythm, normal heart sounds and normal pulses.   Pulmonary/Chest: Effort normal and breath sounds normal. She has no wheezes. She has no rhonchi. She has no rales.  Lymphadenopathy:       Head (right side): No submental, no submandibular, no tonsillar, no preauricular, no posterior auricular and no occipital adenopathy present.       Head (left side): No submental, no submandibular, no tonsillar, no preauricular, no posterior auricular and no occipital adenopathy present.    She has no cervical adenopathy.  Neurological: She is alert.  Skin: Skin is warm and dry.  Psychiatric: She has a normal mood and affect. Her speech is normal and behavior is normal. Thought content normal.  Vitals reviewed.      Assessment & Plan:   Problem List Items Addressed This Visit      Cardiovascular and Mediastinum   Hypertension    Controlled.continue current regimen.      Relevant Medications   amLODipine (NORVASC) 10 MG tablet   losartan-hydrochlorothiazide (HYZAAR) 50-12.5 MG tablet   meloxicam (MOBIC) 15 MG tablet   metoprolol tartrate (LOPRESSOR) 25 MG tablet   Other Relevant Orders   Comprehensive metabolic panel     Respiratory   Bronchitis - Primary    Working diagnosis of viral URI x 6 days. No acute respiratory distress. Afebrile. Patient and I agreed upon conservative therapy at this time with symptom management, close observation, and delayed antibiotic treatment. Return precautions given.        Relevant  Medications   benzonatate (TESSALON) 100 MG capsule     Endocrine   Type 2 diabetes mellitus with diabetic neuropathy, unspecified ([facility])    Stable. Pending a1c      Relevant Medications   losartan-hydrochlorothiazide (HYZAAR) 50-12.5 MG tablet   glucose blood (ONE TOUCH ULTRA TEST) test strip   ONETOUCH DELICA LANCETS FINE MISC   Other Relevant Orders   Hemoglobin A1c   Hypothyroidism   Relevant Medications   metoprolol tartrate (LOPRESSOR) 25 MG tablet   Other Relevant Orders   TSH     Other   Screening for breast cancer    Ordered. Patient will schedule.       Relevant Orders   MM DIGITAL SCREENING BILATERAL   Screen for colon cancer    ordered  Relevant Orders   Ambulatory referral to Gastroenterology       I have changed Ms. Eslinger's ONE TOUCH ULTRA TEST to glucose blood. I have also changed her metoprolol tartrate. I am also having her start on benzonatate. Additionally, I am having her maintain her Calcium Carbonate-Vitamin D, aspirin, BOOSTRIX, predniSONE, omeprazole, gabapentin, metFORMIN, atorvastatin, levothyroxine, amLODipine, losartan-hydrochlorothiazide, meloxicam, and ONETOUCH DELICA LANCETS FINE.   Meds ordered this encounter  Medications  . amLODipine (NORVASC) 10 MG tablet    Sig: TAKE 1 TABLET (10 MG TOTAL) BY MOUTH DAILY.    Dispense:  90 tablet    Refill:  3  . losartan-hydrochlorothiazide (HYZAAR) 50-12.5 MG tablet    Sig: Take 1 tablet by mouth daily.    Dispense:  90 tablet    Refill:  1  . meloxicam (MOBIC) 15 MG tablet    Sig: Take 1 tablet (15 mg total) by mouth daily.    Dispense:  90 tablet    Refill:  0  . metoprolol tartrate (LOPRESSOR) 25 MG tablet    Sig: Take 1 tablet (25 mg total) by mouth 2 (two) times daily.    Dispense:  90 tablet    Refill:  1  . glucose blood (ONE TOUCH ULTRA TEST) test strip    Sig: TEST BLOOD SUGAR 1-2 TIMES A DAY    Dispense:  100 each    Refill:  4  . ONETOUCH DELICA LANCETS FINE MISC     Sig: CHECK BLOOD GLUCOSE TWICE A DAY    Dispense:  100 each    Refill:  3    DX Code Needed  .  . benzonatate (TESSALON) 100 MG capsule    Sig: Take 1 capsule (100 mg total) by mouth 2 (two) times daily as needed for cough.    Dispense:  20 capsule    Refill:  0    Order Specific Question:   Supervising Provider    Answer:   Crecencio Mc [[id number]]    Return precautions given.   Risks, benefits, and alternatives of the medications and treatment plan prescribed today were discussed, and patient expressed understanding.   Education regarding symptom management and diagnosis given to patient on AVS.  Continue to follow with Burnard Hawthorne, FNP for routine health maintenance.   Cynthia [name] and I agreed with plan.   Mable Paris, FNP

## 2017-02-15 NOTE — Assessment & Plan Note (Signed)
Controlled- continue current regimen.  

## 2017-02-15 NOTE — Assessment & Plan Note (Signed)
Ordered.  Patient will schedule. 

## 2017-02-15 NOTE — Assessment & Plan Note (Addendum)
Working diagnosis of viral URI x 7 days. No acute respiratory distress. Afebrile. Patient and I agreed upon conservative therapy at this time with symptom management, close observation, and delayed antibiotic treatment. Return precautions given.

## 2017-02-16 ENCOUNTER — Telehealth: Payer: Self-pay | Admitting: *Deleted

## 2017-02-16 LAB — TSH: TSH: 1.19 u[IU]/mL (ref 0.35–4.50)

## 2017-02-16 NOTE — Telephone Encounter (Signed)
Left message for patient to return call back.  

## 2017-02-16 NOTE — Telephone Encounter (Signed)
Received a call from [facility] lab. They misplaced patients lavender tube & can NOT run her A1C. Pt will need to come back in for repeat labs or a fingerstick for the POCT A1C.

## 2017-02-16 NOTE — Telephone Encounter (Signed)
Pt called back returning your call. Please advise, thank you!  Call pt @ [phone]

## 2017-02-16 NOTE — Telephone Encounter (Signed)
May mail letter informing pt

## 2017-02-17 ENCOUNTER — Other Ambulatory Visit (INDEPENDENT_AMBULATORY_CARE_PROVIDER_SITE_OTHER): Payer: Medicare Other

## 2017-02-17 DIAGNOSIS — E1140 Type 2 diabetes mellitus with diabetic neuropathy, unspecified: Secondary | ICD-10-CM | POA: Diagnosis not present

## 2017-02-17 LAB — POCT GLYCOSYLATED HEMOGLOBIN (HGB A1C): Hemoglobin A1C: 6.2

## 2017-02-17 NOTE — Telephone Encounter (Signed)
Pt came in today & POCT A1C resulted

## 2017-02-17 NOTE — Telephone Encounter (Signed)
Left message for patient to return call back.  

## 2017-02-17 NOTE — Telephone Encounter (Signed)
Patient stated she will come back in for POC A1C.  Patient stated she will try to come in today for lab. If not patient will call and schedule lab appointment for a later time.

## 2017-02-17 NOTE — Addendum Note (Signed)
Addended by: Leeanne Rio on: 02/17/2017 01:59 PM   Modules accepted: Orders

## 2017-02-20 ENCOUNTER — Ambulatory Visit: Payer: Self-pay | Admitting: Family

## 2017-02-21 ENCOUNTER — Other Ambulatory Visit: Payer: Self-pay | Admitting: Family

## 2017-02-21 DIAGNOSIS — E1140 Type 2 diabetes mellitus with diabetic neuropathy, unspecified: Secondary | ICD-10-CM

## 2017-02-21 NOTE — Telephone Encounter (Signed)
Pt called requesting this medication. Please advise, thank you!  Pharmacy - [id number] - , [place] - [address]

## 2017-02-22 ENCOUNTER — Ambulatory Visit: Payer: Self-pay | Admitting: Family

## 2017-02-23 ENCOUNTER — Other Ambulatory Visit: Payer: Self-pay

## 2017-02-23 DIAGNOSIS — E1140 Type 2 diabetes mellitus with diabetic neuropathy, unspecified: Secondary | ICD-10-CM

## 2017-02-23 MED ORDER — GABAPENTIN 100 MG PO CAPS: 100.0000 mg | ORAL_CAPSULE | Freq: Three times a day (TID) | ORAL | 0 refills | Status: DC

## 2017-02-23 NOTE — Telephone Encounter (Signed)
Medication has been refilled.

## 2017-03-09 ENCOUNTER — Telehealth: Payer: Self-pay

## 2017-03-09 ENCOUNTER — Other Ambulatory Visit: Payer: Self-pay

## 2017-03-09 DIAGNOSIS — Z1211 Encounter for screening for malignant neoplasm of colon: Secondary | ICD-10-CM

## 2017-03-09 NOTE — Telephone Encounter (Signed)
Gastroenterology Pre-Procedure Review  Request Date: 04/20/17 Requesting Physician: Dr. Vicente Males  PATIENT REVIEW QUESTIONS: The patient responded to the following health history questions as indicated:    1. Are you having any GI issues? no 2. Do you have a personal history of Polyps? no 3. Do you have a family history of Colon Cancer or Polyps? no 4. Diabetes Mellitus? yes (Type 2) 5. Joint replacements in the past 12 months?no 6. Major health problems in the past 3 months?no 7. Any artificial heart valves, MVP, or defibrillator?no    MEDICATIONS & ALLERGIES:    Patient reports the following regarding taking any anticoagulation/antiplatelet therapy:   Plavix, Coumadin, Eliquis, Xarelto, Lovenox, Pradaxa, Brilinta, or Effient? no Aspirin? yes (Baby aspirin)  Patient confirms/reports the following medications:  Current Outpatient Prescriptions  Medication Sig Dispense Refill  . amLODipine (NORVASC) 10 MG tablet TAKE 1 TABLET (10 MG TOTAL) BY MOUTH DAILY. 90 tablet 3  . aspirin 81 MG tablet Take 81 mg by mouth daily.    Marland Kitchen atorvastatin (LIPITOR) 20 MG tablet TAKE 1 TABLET (20 MG TOTAL) BY MOUTH DAILY. 90 tablet 1  . benzonatate (TESSALON) 100 MG capsule Take 1 capsule (100 mg total) by mouth 2 (two) times daily as needed for cough. 20 capsule 0  . BOOSTRIX 5-2.5-18.5 injection     . Calcium Carbonate-Vitamin D 600-400 MG-UNIT per tablet Take 1 tablet by mouth daily.    Marland Kitchen gabapentin (NEURONTIN) 100 MG capsule Take 1 capsule (100 mg total) by mouth 3 (three) times daily. 90 capsule 0  . glucose blood (ONE TOUCH ULTRA TEST) test strip TEST BLOOD SUGAR 1-2 TIMES A DAY 100 each 4  . levothyroxine (SYNTHROID, LEVOTHROID) 137 MCG tablet TAKE 1 TABLET (137 MCG TOTAL) BY MOUTH DAILY BEFORE BREAKFAST. 90 tablet 1  . losartan-hydrochlorothiazide (HYZAAR) 50-12.5 MG tablet Take 1 tablet by mouth daily. 90 tablet 1  . meloxicam (MOBIC) 15 MG tablet Take 1 tablet (15 mg total) by mouth daily. 90 tablet 0   . metFORMIN (GLUCOPHAGE) 500 MG tablet TAKE 1 TABLET (500 MG TOTAL) BY MOUTH 2 (TWO) TIMES DAILY WITH A MEAL. 180 tablet 1  . metoprolol tartrate (LOPRESSOR) 25 MG tablet Take 1 tablet (25 mg total) by mouth 2 (two) times daily. 90 tablet 1  . omeprazole (PRILOSEC) 20 MG capsule Take 1 capsule (20 mg total) by mouth daily. 90 capsule 1  . ONETOUCH DELICA LANCETS FINE MISC CHECK BLOOD GLUCOSE TWICE A DAY 100 each 3  . predniSONE (DELTASONE) 10 MG tablet Take 40 mg by mouth on day 1, then taper 10 mg daily until gone 10 tablet 0   No current facility-administered medications for this visit.     Patient confirms/reports the following allergies:  No Known Allergies  No orders of the defined types were placed in this encounter.   AUTHORIZATION INFORMATION Primary Insurance: 1D#: Group #:  Secondary Insurance: 1D#: Group #:  SCHEDULE INFORMATION: Date: 04/20/17 Time: Location: [place]

## 2017-03-16 ENCOUNTER — Ambulatory Visit
Admission: RE | Admit: 2017-03-16 | Discharge: 2017-03-16 | Disposition: A | Discharge status: Home / Self Care | Payer: Medicare Other | Source: Ambulatory Visit | Attending: Family | Admitting: Family

## 2017-03-16 DIAGNOSIS — Z1231 Encounter for screening mammogram for malignant neoplasm of breast: Secondary | ICD-10-CM | POA: Insufficient documentation

## 2017-03-16 DIAGNOSIS — Z1239 Encounter for other screening for malignant neoplasm of breast: Secondary | ICD-10-CM

## 2017-03-16 IMAGING — MG MM DIGITAL SCREENING BILAT W/ CAD
5 series · 5 of 5 positions shown · non-contrast
Comparison: Previous exam(s).

CLINICAL DATA: Screening.

EXAM:
DIGITAL SCREENING BILATERAL MAMMOGRAM WITH CAD

[R MLO]
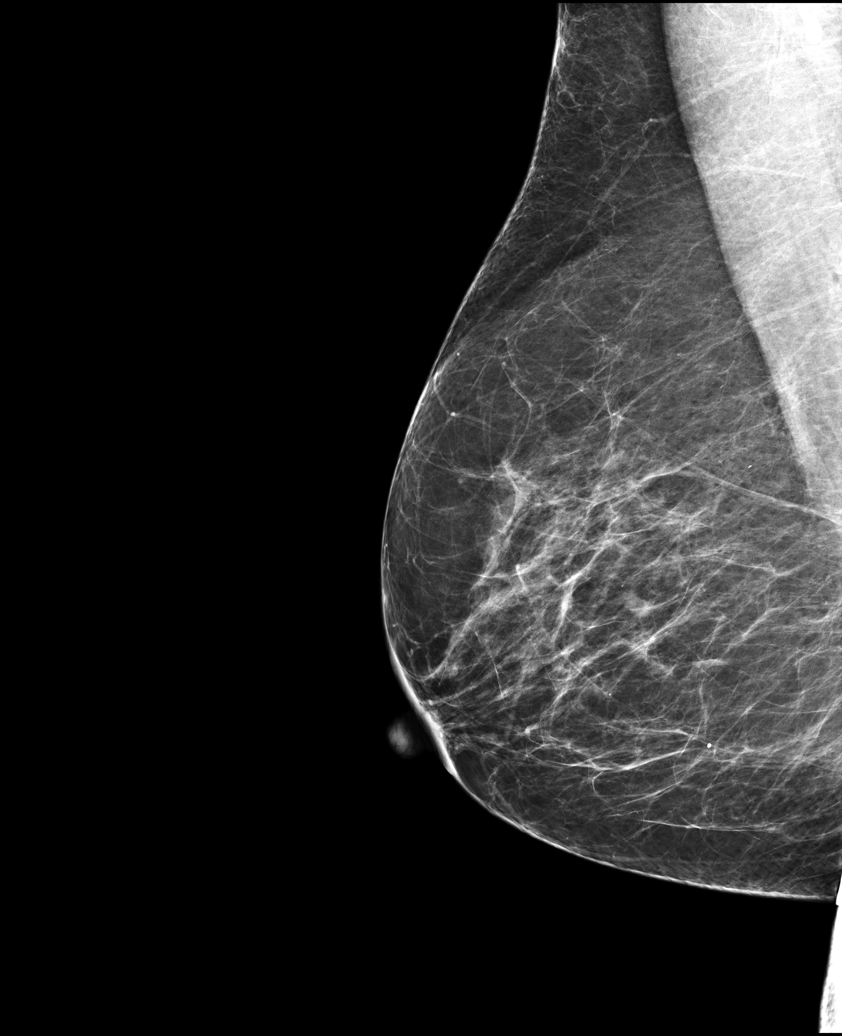

[R CC]
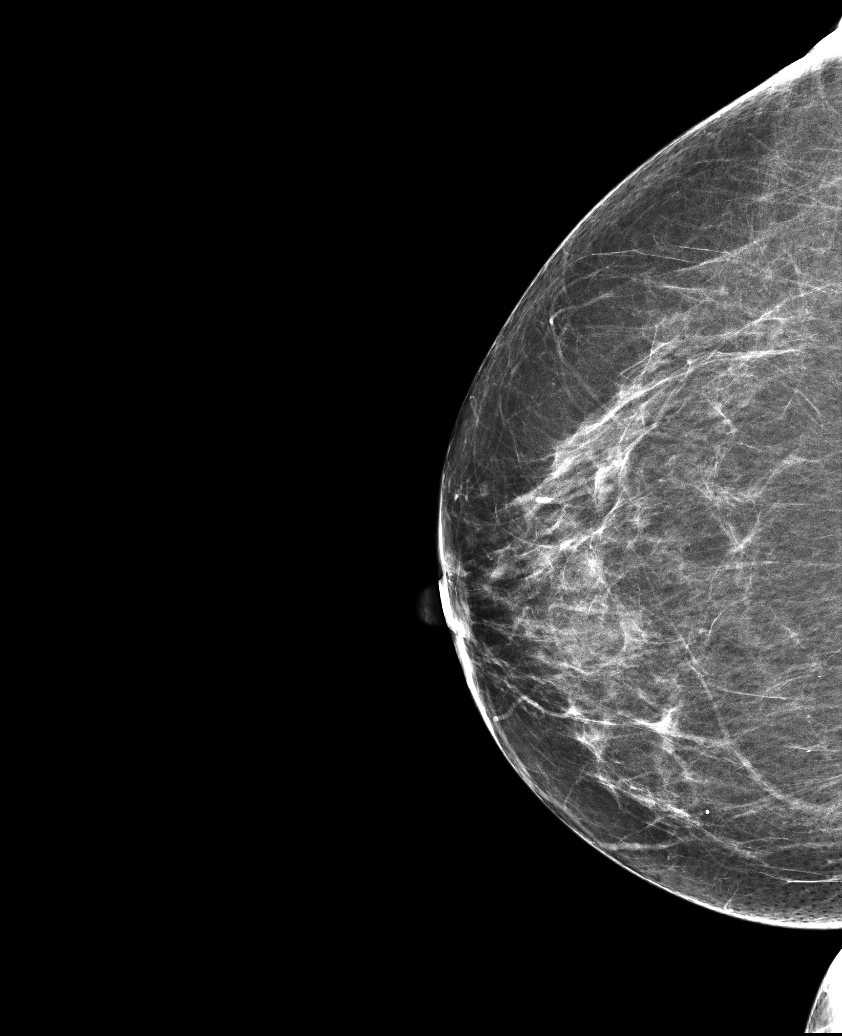

[L CC]
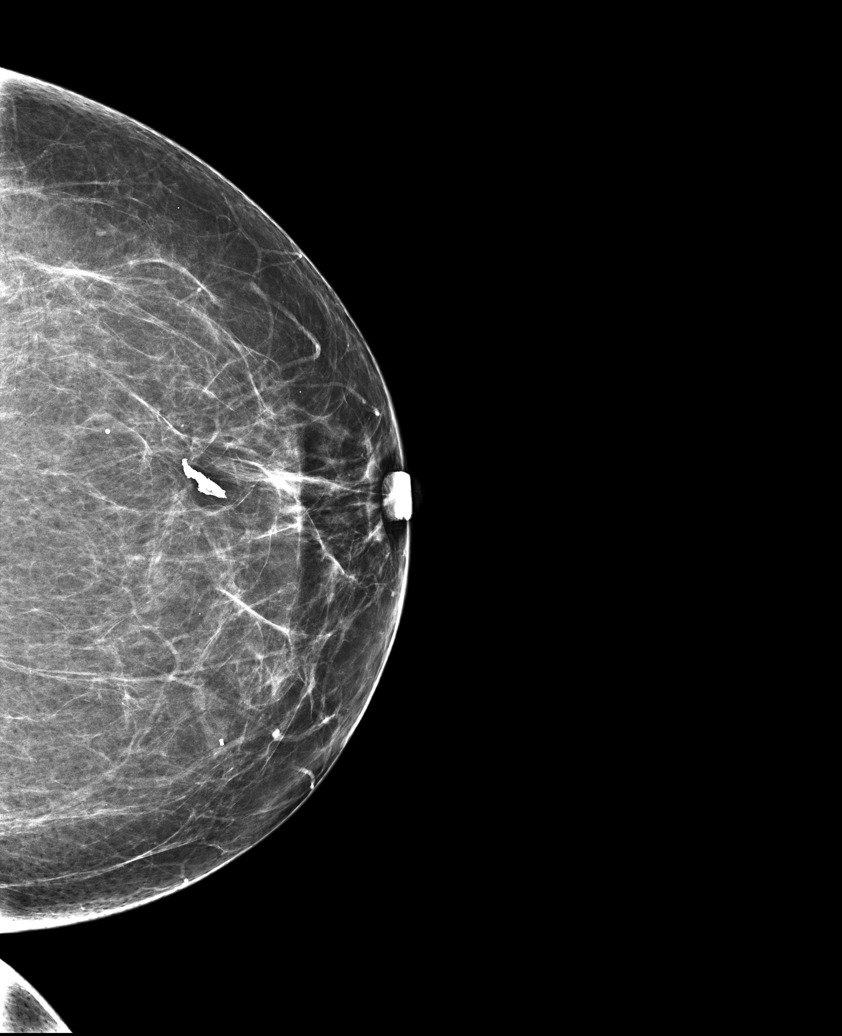

[L MLO (1 of 2)]
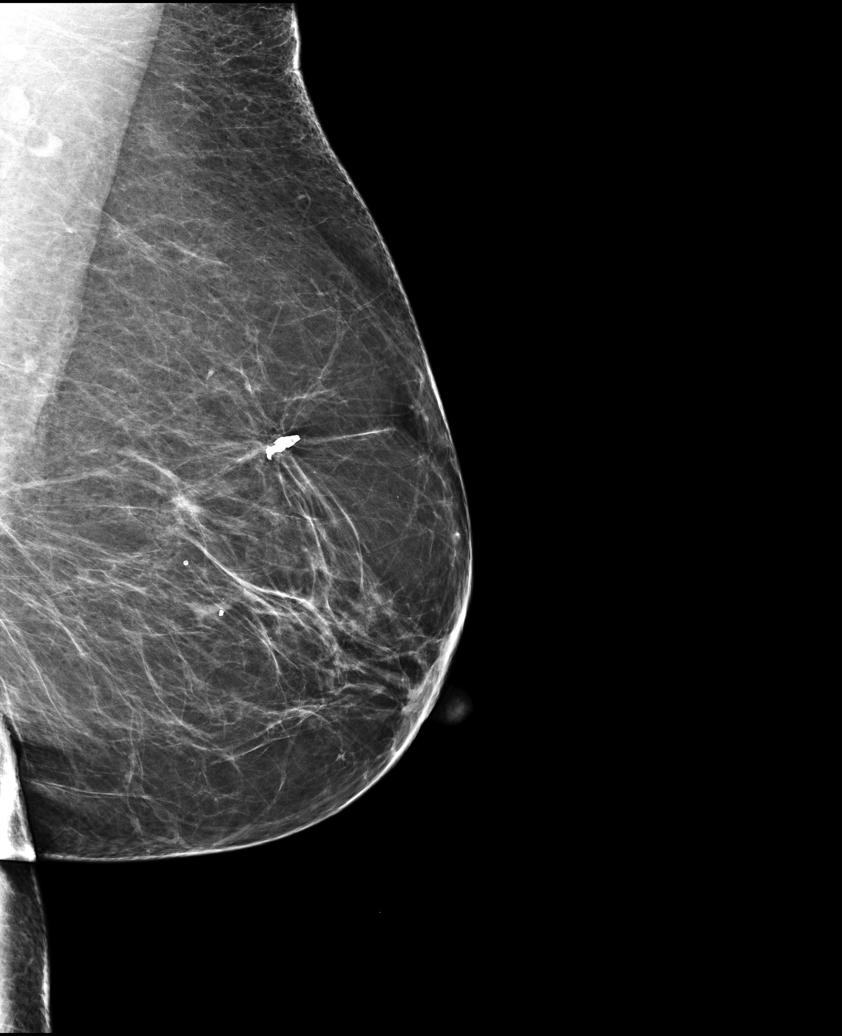

[L MLO (2 of 2)]
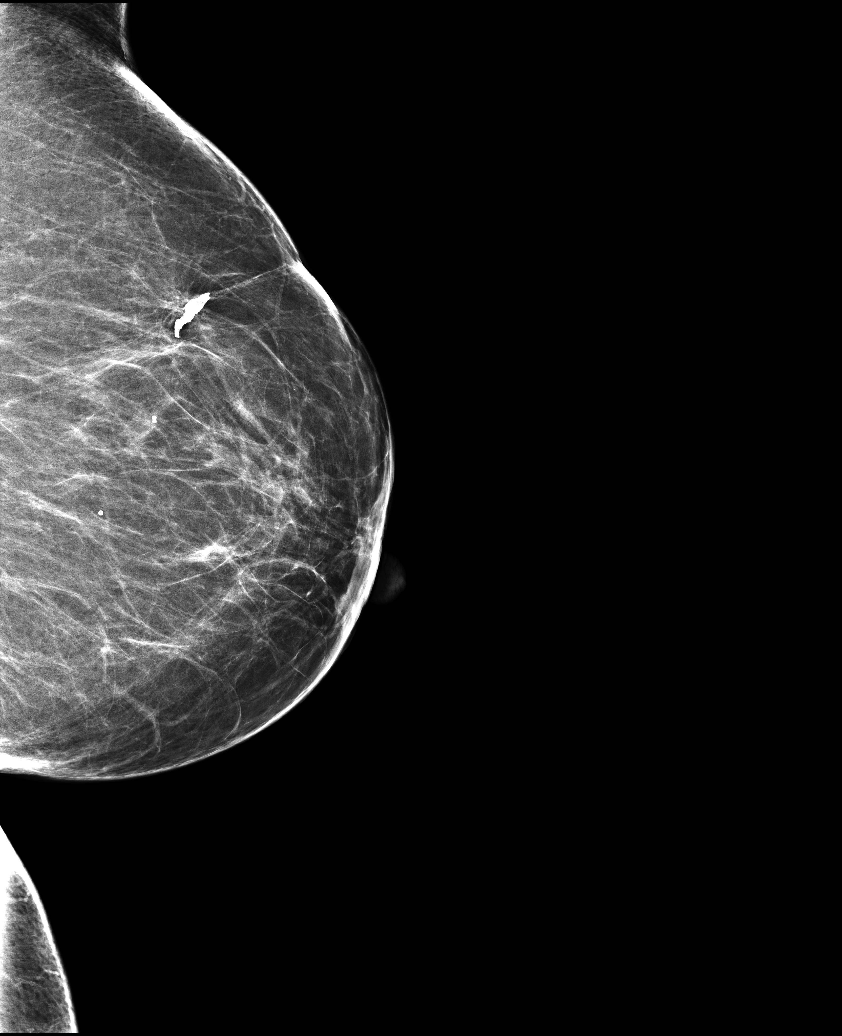

[5 of 5 positions shown; findings below may reference images not displayed]

ACR Breast Density Category b: There are scattered areas of
fibroglandular density.
FINDINGS: There are no findings suspicious for malignancy. Images were
processed with CAD.
IMPRESSION: No mammographic evidence of malignancy. A result letter of this
screening mammogram will be mailed directly to the patient.

RECOMMENDATION:
Screening mammogram in one year. (Code:[US])

BI-RADS CATEGORY  1: Negative.

## 2017-03-22 ENCOUNTER — Other Ambulatory Visit: Payer: Self-pay | Admitting: Family

## 2017-03-22 DIAGNOSIS — E1140 Type 2 diabetes mellitus with diabetic neuropathy, unspecified: Secondary | ICD-10-CM

## 2017-04-07 ENCOUNTER — Ambulatory Visit (INDEPENDENT_AMBULATORY_CARE_PROVIDER_SITE_OTHER): Payer: Medicare Other

## 2017-04-07 VITALS — BP 122/68 | HR 72 | Temp 98.4°F | Resp 14 | Ht 68.0 in | Wt 248.8 lb

## 2017-04-07 DIAGNOSIS — E2839 Other primary ovarian failure: Secondary | ICD-10-CM | POA: Diagnosis not present

## 2017-04-07 DIAGNOSIS — Z1389 Encounter for screening for other disorder: Secondary | ICD-10-CM

## 2017-04-07 DIAGNOSIS — Z0000 Encounter for general adult medical examination without abnormal findings: Secondary | ICD-10-CM | POA: Diagnosis not present

## 2017-04-07 DIAGNOSIS — Z1331 Encounter for screening for depression: Secondary | ICD-10-CM

## 2017-04-07 NOTE — Patient Instructions (Addendum)
Cynthia [name] , Thank you for taking time to come for your Medicare Wellness Visit. I appreciate your ongoing commitment to your health goals. Please review the following plan we discussed and let me know if I can assist you in the future.   Follow up with Mable Paris FNP as needed.    Gabapentin is ready for pick up at pharmacy.  Metformin not due until 04/16/17.  Automatic refill.  Dexa scan ordrered; follow as directed.  Schedule eye exam.  Have a great day!  These are the goals we discussed: Goals    . Healthy Lifestyle          STAY HYDRATED AND DRINK PLENTY OF FLUIDS.  INCREASE WATER INTAKE. LOW CARB FOODS.  LEAN MEATS (CHICKEN, [place], FISH).  VEGETABLES, FRUITS. STRETCH AND STAY ACTIVE BY CONTINUING TO WALK WITH SISTER WHEN THE WEATHER PERMITS.  3 DAYS A WEEK FOR 30 MINUTES.         This is a list of the screening recommended for you and due dates:  Health Maintenance  Topic Date Due  . DEXA scan (bone density measurement)  10/07/2010  . Flu Shot  04/19/2017  . Eye exam for diabetics  06/08/2017  . Hemoglobin A1C  08/19/2017  . Complete foot exam   10/13/2017  . Colon Cancer Screening  10/18/2017  . Mammogram  03/17/2019  . Tetanus Vaccine  05/17/2026  .  Hepatitis C: One time screening is recommended by Center for Disease Control  (CDC) for  adults born from [birth year] through 1965.   Completed  . Pneumonia vaccines  Completed   Bone Densitometry Bone densitometry is an imaging test that uses a special X-ray to measure the amount of calcium and other minerals in your bones (bone density). This test is also known as a bone mineral density test or dual-energy X-ray absorptiometry (DXA). The test can measure bone density at your hip and your spine. It is similar to having a regular X-ray. You may have this test to:  Diagnose a condition that causes weak or thin bones (osteoporosis).  Predict your risk of a broken bone (fracture).  Determine how well osteoporosis  treatment is working.  Tell a health care provider about:  Any allergies you have.  All medicines you are taking, including vitamins, herbs, eye drops, creams, and over-the-counter medicines.  Any problems you or family members have had with anesthetic medicines.  Any blood disorders you have.  Any surgeries you have had.  Any medical conditions you have.  Possibility of pregnancy.  Any other medical test you had within the previous 14 days that used contrast material. What are the risks? Generally, this is a safe procedure. However, problems can occur and may include the following:  This test exposes you to a very small amount of radiation.  The risks of radiation exposure may be greater to unborn children.  What happens before the procedure?  Do not take any calcium supplements for 24 hours before having the test. You can otherwise eat and drink what you usually do.  Take off all metal jewelry, eyeglasses, dental appliances, and any other metal objects. What happens during the procedure?  You may lie on an exam table. There will be an X-ray generator below you and an imaging device above you.  Other devices, such as boxes or braces, may be used to position your body properly for the scan.  You will need to lie still while the machine slowly scans your body.  The  images will show up on a computer monitor. What happens after the procedure? You may need more testing at a later time. This information is not intended to replace advice given to you by your health care provider. Make sure you discuss any questions you have with your health care provider. Document Released: 09/27/2004 Document Revised: 02/11/2016 Document Reviewed: 02/13/2014 Elsevier Interactive Patient Education  2018 [employer].

## 2017-04-07 NOTE — Progress Notes (Signed)
Subjective:   Cynthia [name] is a 71 y.o. female who presents for Medicare Annual (Subsequent) preventive examination.  Review of Systems:  No ROS.  Medicare Wellness Visit. Additional risk factors are reflected in the social history.  Cardiac Risk Factors include: advanced age (>50men, >79 women);hypertension;diabetes mellitus     Objective:     Vitals: BP 122/68 (BP Location: Left Arm, Patient Position: Sitting, Cuff Size: Normal)   Pulse 72   Temp 98.4 F (36.9 C) (Oral)   Resp 14   Ht 5\' 8"  (1.727 m)   Wt 248 lb 12.8 oz (112.9 kg)   SpO2 98%   BMI 37.83 kg/m   Body mass index is 37.83 kg/m.   Tobacco History  Smoking Status  . Former Smoker  . Years: 20.00  . Types: Cigarettes  . Quit date: 11/17/2012  Smokeless Tobacco  . Former Systems developer     Counseling given: Not Answered   Past Medical History:  Diagnosis Date  . Arthritis   . Diabetes mellitus without complication ([place])   . Hyperlipidemia   . Hypertension   . Lung nodule   . Thyroid disease    Past Surgical History:  Procedure Laterality Date  . BREAST BIOPSY Left 11/25/2015   stereo  neg  . BREAST EXCISIONAL BIOPSY Left yrs ago   benign  . THYROIDECTOMY     Dr. [name]  . VAGINAL DELIVERY     4   Family History  Problem Relation Age of Onset  . Hypertension Mother   . Hypertension Father   . Diabetes Sister   . Thyroid disease Sister   . Breast cancer Neg Hx    History  Sexual Activity  . Sexual activity: No    Outpatient Encounter Prescriptions as of 04/07/2017  Medication Sig  . amLODipine (NORVASC) 10 MG tablet TAKE 1 TABLET (10 MG TOTAL) BY MOUTH DAILY.  Marland Kitchen aspirin 81 MG tablet Take 81 mg by mouth daily.  Marland Kitchen atorvastatin (LIPITOR) 20 MG tablet TAKE 1 TABLET (20 MG TOTAL) BY MOUTH DAILY.  . benzonatate (TESSALON) 100 MG capsule Take 1 capsule (100 mg total) by mouth 2 (two) times daily as needed for cough.  Durwin Reges 5-2.5-18.5 injection   . Calcium Carbonate-Vitamin D 600-400  MG-UNIT per tablet Take 1 tablet by mouth daily.  Marland Kitchen gabapentin (NEURONTIN) 100 MG capsule Take 1 capsule (100 mg total) by mouth 3 (three) times daily.  Marland Kitchen glucose blood (ONE TOUCH ULTRA TEST) test strip TEST BLOOD SUGAR 1-2 TIMES A DAY  . levothyroxine (SYNTHROID, LEVOTHROID) 137 MCG tablet TAKE 1 TABLET (137 MCG TOTAL) BY MOUTH DAILY BEFORE BREAKFAST.  Marland Kitchen losartan-hydrochlorothiazide (HYZAAR) 50-12.5 MG tablet Take 1 tablet by mouth daily.  . meloxicam (MOBIC) 15 MG tablet Take 1 tablet (15 mg total) by mouth daily.  . metFORMIN (GLUCOPHAGE) 500 MG tablet TAKE 1 TABLET (500 MG TOTAL) BY MOUTH 2 (TWO) TIMES DAILY WITH A MEAL.  . metoprolol tartrate (LOPRESSOR) 25 MG tablet Take 1 tablet (25 mg total) by mouth 2 (two) times daily.  Marland Kitchen omeprazole (PRILOSEC) 20 MG capsule Take 1 capsule (20 mg total) by mouth daily.  Glory Rosebush DELICA LANCETS FINE MISC CHECK BLOOD GLUCOSE TWICE A DAY  . predniSONE (DELTASONE) 10 MG tablet Take 40 mg by mouth on day 1, then taper 10 mg daily until gone   No facility-administered encounter medications on file as of 04/07/2017.     Activities of Daily Living In your present state of health,  do you have any difficulty performing the following activities: 04/07/2017 04/07/2016  Hearing? N N  Vision? N N  Difficulty concentrating or making decisions? N N  Walking or climbing stairs? N N  Dressing or bathing? N N  Doing errands, shopping? N N  Preparing Food and eating ? N N  Using the Toilet? N N  In the past six months, have you accidently leaked urine? N N  Do you have problems with loss of bowel control? N N  Managing your Medications? N N  Managing your Finances? N N  Housekeeping or managing your Housekeeping? N N  Some recent data might be hidden    Patient Care Team: Burnard Hawthorne, FNP as PCP - General (Family Medicine) Jackolyn Confer, MD (Internal Medicine)    Assessment:    This is a routine wellness examination for Cynthia [name]. The goal of the  wellness visit is to assist the patient how to close the gaps in care and create a preventative care plan for the patient.   The roster of all physicians providing medical care to patient is listed in the Snapshot section of the chart.  Taking calcium VIT D as appropriate/Osteoporosis risk reviewed.    Safety issues reviewed; Smoke and carbon monoxide detectors in the home. No firearms in the home.  Wears seatbelts when driving or riding with others. Patient does wear sunscreen or protective clothing when in direct sunlight. No violence in the home.  Depression- PHQ 2 &9 complete.  No signs/symptoms or verbal communication regarding little pleasure in doing things, feeling down, depressed or hopeless. No changes in sleeping, energy, eating, concentrating.  No thoughts of self harm or harm towards others.  Time spent on this topic is 8 minutes.   Patient is alert, normal appearance, oriented to person/place/and time.  Correctly identified the president of the [place], recall of 3/3 words. Difficulty performing simple calculations. Displays appropriate judgement and can read correct time from watch face.   No new identified risk were noted.  No failures at ADL's or IADL's.   BMI- discussed the importance of a healthy diet, water intake and the benefits of aerobic exercise. Educational material provided.   24 hour diet recall: Breakfast: Oatmeal Lunch: Tomato sandwich, baked beans Dinner: Hamburger, cream potatoes  Daily fluid intake: 1 cups of caffeine, 6-8 cups of water  Dental- dentures.  Eye- Visual acuity not assessed per patient preference since they have regular follow up with the ophthalmologist.  Wears corrective lenses.  Sleep patterns- Sleeps 6-7 hours at night.  Wakes feeling rested.  Dexa Scan ordered; follow as directed.  Educational material provided.  Patient Concerns: None at this time. Follow up with PCP as needed.  Exercise Activities and Dietary  recommendations Current Exercise Habits: Home exercise routine, Type of exercise: walking, Time (Minutes): 30, Frequency (Times/Week): 4, Weekly Exercise (Minutes/Week): 120, Intensity: Moderate  Goals    . Healthy Lifestyle          STAY HYDRATED AND DRINK PLENTY OF FLUIDS.  INCREASE WATER INTAKE. LOW CARB FOODS.  LEAN MEATS (CHICKEN, [place], FISH).  VEGETABLES, FRUITS. STRETCH AND STAY ACTIVE BY CONTINUING TO WALK WITH SISTER WHEN THE WEATHER PERMITS.  3 DAYS A WEEK FOR 30 MINUTES.        Fall Risk Fall Risk  04/07/2017 02/15/2017 11/15/2016 08/15/2016 04/07/2016  Falls in the past year? No No No No No   Depression Screen PHQ 2/9 Scores 04/07/2017 02/15/2017 11/15/2016 08/15/2016  PHQ - 2 Score 0  0 0 0  PHQ- 9 Score 0 - - -     Cognitive Function MMSE - Mini Mental State Exam 04/07/2017 04/07/2016 04/02/2015  Orientation to time 5 5 5   Orientation to Place 5 5 5   Registration 3 3 3   Attention/ Calculation 2 5 5   Attention/Calculation-comments Difficulty performing simple calculation - -  Recall 3 3 3   Language- name 2 objects 2 2 2   Language- repeat 1 1 1   Language- follow 3 step command 3 3 3   Language- read & follow direction 1 1 1   Write a sentence 1 1 1   Copy design 1 1 1   Total score 27 30 30         Immunization History  Administered Date(s) Administered  . Influenza,inj,Quad PF,36+ Mos 06/13/2013, 06/13/2014, 07/03/2015, 05/11/2016  . Influenza-Unspecified 07/18/2012  . Pneumococcal Conjugate-13 03/07/2014  . Pneumococcal Polysaccharide-23 03/08/2013  . Tdap 05/17/2016   Screening Tests Health Maintenance  Topic Date Due  . DEXA SCAN  10/07/2010  . INFLUENZA VACCINE  04/19/2017  . OPHTHALMOLOGY EXAM  06/08/2017  . HEMOGLOBIN A1C  08/19/2017  . FOOT EXAM  10/13/2017  . COLONOSCOPY  10/18/2017  . MAMMOGRAM  03/17/2019  . TETANUS/TDAP  05/17/2026  . Hepatitis C Screening  Completed  . PNA vac Low Risk Adult  Completed      Plan:   End of life planning;  Advanced aging; Advanced directives discussed.  No HCPOA/Living Will.  Additional information declined at this time.  I have personally reviewed and noted the following in the patient's chart:   . Medical and social history . Use of alcohol, tobacco or illicit drugs  . Current medications and supplements . Functional ability and status . Nutritional status . Physical activity . Advanced directives . List of other physicians . Hospitalizations, surgeries, and ER visits in previous 12 months . Vitals . Screenings to include cognitive, depression, and falls . Referrals and appointments  In addition, I have reviewed and discussed with patient certain preventive protocols, quality metrics, and best practice recommendations. A written personalized care plan for preventive services as well as general preventive health recommendations were provided to patient.     Varney Biles, LPN  4/92/0100   Reviewed above information.  Agree with assessment and plan.  Dr Nicki Reaper

## 2017-04-11 ENCOUNTER — Encounter: Payer: Self-pay | Admitting: Family

## 2017-04-17 ENCOUNTER — Telehealth: Payer: Self-pay | Admitting: Gastroenterology

## 2017-04-17 NOTE — Telephone Encounter (Signed)
Patient has an upcoming procedure and has questions regarding when to take her medications. She ask for you. Please call

## 2017-04-18 ENCOUNTER — Telehealth: Payer: Self-pay

## 2017-04-18 NOTE — Telephone Encounter (Signed)
LVM for pt to return my call to advise on medications prior to Colonoscopy.

## 2017-04-18 NOTE — Telephone Encounter (Signed)
Pt has been advised she can take her meds with just a sip of water.

## 2017-04-20 ENCOUNTER — Encounter: Payer: Self-pay | Admitting: *Deleted

## 2017-04-20 ENCOUNTER — Ambulatory Visit: Payer: Medicare Other | Admitting: Anesthesiology

## 2017-04-20 ENCOUNTER — Encounter
Admission: RE | Disposition: A | Discharge status: Home / Self Care | Payer: Self-pay | Source: Ambulatory Visit | Attending: Gastroenterology

## 2017-04-20 ENCOUNTER — Ambulatory Visit
Admission: RE | Admit: 2017-04-20 | Discharge: 2017-04-20 | Disposition: A | Discharge status: Home / Self Care | Payer: Medicare Other | Source: Ambulatory Visit | Attending: Gastroenterology | Admitting: Gastroenterology

## 2017-04-20 DIAGNOSIS — D122 Benign neoplasm of ascending colon: Secondary | ICD-10-CM | POA: Insufficient documentation

## 2017-04-20 DIAGNOSIS — J449 Chronic obstructive pulmonary disease, unspecified: Secondary | ICD-10-CM | POA: Diagnosis not present

## 2017-04-20 DIAGNOSIS — K5730 Diverticulosis of large intestine without perforation or abscess without bleeding: Secondary | ICD-10-CM | POA: Diagnosis not present

## 2017-04-20 DIAGNOSIS — D125 Benign neoplasm of sigmoid colon: Secondary | ICD-10-CM

## 2017-04-20 DIAGNOSIS — M1990 Unspecified osteoarthritis, unspecified site: Secondary | ICD-10-CM | POA: Diagnosis not present

## 2017-04-20 DIAGNOSIS — I2510 Atherosclerotic heart disease of native coronary artery without angina pectoris: Secondary | ICD-10-CM | POA: Diagnosis not present

## 2017-04-20 DIAGNOSIS — Z87891 Personal history of nicotine dependence: Secondary | ICD-10-CM | POA: Insufficient documentation

## 2017-04-20 DIAGNOSIS — Z6838 Body mass index (BMI) 38.0-38.9, adult: Secondary | ICD-10-CM | POA: Diagnosis not present

## 2017-04-20 DIAGNOSIS — Z1211 Encounter for screening for malignant neoplasm of colon: Secondary | ICD-10-CM | POA: Insufficient documentation

## 2017-04-20 DIAGNOSIS — Z79899 Other long term (current) drug therapy: Secondary | ICD-10-CM | POA: Diagnosis not present

## 2017-04-20 DIAGNOSIS — I10 Essential (primary) hypertension: Secondary | ICD-10-CM | POA: Insufficient documentation

## 2017-04-20 DIAGNOSIS — D123 Benign neoplasm of transverse colon: Secondary | ICD-10-CM | POA: Diagnosis not present

## 2017-04-20 DIAGNOSIS — Z7984 Long term (current) use of oral hypoglycemic drugs: Secondary | ICD-10-CM | POA: Diagnosis not present

## 2017-04-20 DIAGNOSIS — E119 Type 2 diabetes mellitus without complications: Secondary | ICD-10-CM | POA: Diagnosis not present

## 2017-04-20 DIAGNOSIS — Z7982 Long term (current) use of aspirin: Secondary | ICD-10-CM | POA: Diagnosis not present

## 2017-04-20 DIAGNOSIS — K635 Polyp of colon: Secondary | ICD-10-CM | POA: Diagnosis not present

## 2017-04-20 DIAGNOSIS — K640 First degree hemorrhoids: Secondary | ICD-10-CM | POA: Diagnosis not present

## 2017-04-20 DIAGNOSIS — D124 Benign neoplasm of descending colon: Secondary | ICD-10-CM | POA: Insufficient documentation

## 2017-04-20 DIAGNOSIS — E785 Hyperlipidemia, unspecified: Secondary | ICD-10-CM | POA: Diagnosis not present

## 2017-04-20 HISTORY — DX: Atherosclerotic heart disease of native coronary artery without angina pectoris: I25.10

## 2017-04-20 HISTORY — PX: COLONOSCOPY WITH PROPOFOL: SHX5780

## 2017-04-20 LAB — GLUCOSE, CAPILLARY: GLUCOSE-CAPILLARY: 109 mg/dL — AB (ref 65–99)

## 2017-04-20 SURGERY — COLONOSCOPY WITH PROPOFOL
Anesthesia: General

## 2017-04-20 MED ORDER — FENTANYL CITRATE (PF) 100 MCG/2ML IJ SOLN
INTRAMUSCULAR | Status: DC | PRN
  Administered 2017-04-20 (×2): 50 ug via INTRAVENOUS

## 2017-04-20 MED ORDER — MIDAZOLAM HCL 2 MG/2ML IJ SOLN
INTRAMUSCULAR | Status: DC | PRN
Start: 2017-04-20 — End: 2017-04-20
  Administered 2017-04-20: 2 mg via INTRAVENOUS

## 2017-04-20 MED ORDER — PROPOFOL 500 MG/50ML IV EMUL
INTRAVENOUS | Status: DC | PRN
  Administered 2017-04-20: 120 ug/kg/min via INTRAVENOUS

## 2017-04-20 MED ORDER — METHYLENE BLUE 0.5 % INJ SOLN
INTRAVENOUS | Status: DC | PRN
  Administered 2017-04-20: 8 mL via SUBMUCOSAL

## 2017-04-20 MED ORDER — EPHEDRINE SULFATE 50 MG/ML IJ SOLN
INTRAMUSCULAR | Status: DC | PRN
  Administered 2017-04-20: 5 mg via INTRAVENOUS

## 2017-04-20 MED ORDER — FENTANYL CITRATE (PF) 100 MCG/2ML IJ SOLN
INTRAMUSCULAR | Status: AC
  Filled 2017-04-20: qty 2

## 2017-04-20 MED ORDER — MIDAZOLAM HCL 2 MG/2ML IJ SOLN
INTRAMUSCULAR | Status: AC
  Filled 2017-04-20: qty 2

## 2017-04-20 MED ORDER — EPHEDRINE SULFATE 50 MG/ML IJ SOLN
INTRAMUSCULAR | Status: AC
  Filled 2017-04-20: qty 1

## 2017-04-20 MED ORDER — PROPOFOL 500 MG/50ML IV EMUL
INTRAVENOUS | Status: AC
  Filled 2017-04-20: qty 50

## 2017-04-20 MED ORDER — SODIUM CHLORIDE 0.9 % IV SOLN
INTRAVENOUS | Status: DC
  Administered 2017-04-20: 08:00:00 via INTRAVENOUS

## 2017-04-20 MED ORDER — PROPOFOL 10 MG/ML IV BOLUS
INTRAVENOUS | Status: AC
  Filled 2017-04-20: qty 20

## 2017-04-20 NOTE — Op Note (Signed)
[facility] Gastroenterology Patient Name: Cynthia [name] Procedure Date: 04/20/2017 9:37 AM MRN: [id number] Account #: [id number] Date of Birth: 1945/10/08 Admit Type: Outpatient Age: 71 Room: [facility] ENDO ROOM 4 Gender: Female Note Status: Finalized Procedure:            Colonoscopy Indications:          Screening for colorectal malignant neoplasm Providers:            [name] MD, MD Referring MD:         Yvetta Coder. Arnett (Referring MD) Medicines:            Monitored Anesthesia Care Complications:        No immediate complications. Procedure:            Pre-Anesthesia Assessment:                       - Prior to the procedure, a History and Physical was                        performed, and patient medications, allergies and                        sensitivities were reviewed. The patient's tolerance of                        previous anesthesia was reviewed.                       - The risks and benefits of the procedure and the                        sedation options and risks were discussed with the                        patient. All questions were answered and informed                        consent was obtained.                       - ASA Grade Assessment: II - A patient with mild                        systemic disease.                       After obtaining informed consent, the colonoscope was                        passed under direct vision. Throughout the procedure,                        the patient's blood pressure, pulse, and oxygen                        saturations were monitored continuously. The                        Colonoscope was introduced through the anus and  advanced to the the cecum, identified by the                        appendiceal orifice, IC valve and transillumination.                        The colonoscopy was performed with ease. The patient                        tolerated the procedure well. The  quality of the bowel                        preparation was good. Findings:      The perianal and digital rectal examinations were normal.      Non-bleeding internal hemorrhoids were found during retroflexion. The       hemorrhoids were small and Grade I (internal hemorrhoids that do not       prolapse).      Four sessile polyps were found in the sigmoid colon, transverse colon,       ascending colon and cecum. The polyps were 5 to 8 mm in size. These       polyps were removed with a cold snare. Resection and retrieval were       complete.      A 15 mm polyp was found in the ascending colon. The polyp was sessile.       Preparations were made for mucosal resection. 8 mL of Eleview was       injected with adequate lift of the lesion from the muscularis propria.       Snare mucosal resection with suction (via the working channel) retrieval       was performed. A 17 mm area was resected. Resection and retrieval were       complete. There was no bleeding during, and at the end, of the       procedure. To prevent bleeding after mucosal resection, two hemostatic       clips were successfully placed. There was no bleeding during, or at the       end, of the procedure.      A 12 mm polyp was found in the transverse colon. The polyp was       pedunculated. The polyp was removed with a hot snare. Resection and       retrieval were complete. To prevent bleeding after the polypectomy, one       hemostatic clip was successfully placed. There was no bleeding during,       or at the end, of the procedure.      Multiple sessile polyps were found in the descending colon. The polyps       were 3 to 6 mm in size. These polyps were removed with a cold snare.       Resection and retrieval were complete.      The exam was otherwise without abnormality on direct and retroflexion       views.      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 Impression:           - Non-bleeding internal  hemorrhoids.                       - Four 5 to 8 mm polyps in the sigmoid colon, in  the                        transverse colon, in the ascending colon and in the                        cecum, removed with a cold snare. Resected and                        retrieved.                       - One 15 mm polyp in the ascending colon, removed with                        mucosal resection. Resected and retrieved. Clips were                        placed.                       - One 12 mm polyp in the transverse colon, removed with                        a hot snare. Resected and retrieved. Clip was placed.                       - Multiple 3 to 6 mm polyps in the descending colon,                        removed with a cold snare. Resected and retrieved.                       - The examination was otherwise normal on direct and                        retroflexion views.                       - Mucosal resection was performed. Resection and                        retrieval were complete. Recommendation:       - Discharge patient to home (with escort).                       - Resume previous diet.                       - Continue present medications.                       - Await pathology results.                       - No ibuprofen, naproxen, or other non-steroidal                        anti-inflammatory drugs for 6 weeks after polyp removal. Procedure Code(s):    --- Professional ---                       [id number], 59, Colonoscopy,  flexible; with endoscopic                        mucosal resection                       [phone], Colonoscopy, flexible; with removal of tumor(s),                        polyp(s), or other lesion(s) by snare technique Diagnosis Code(s):    --- Professional ---                       Z12.11, Encounter for screening for malignant neoplasm                        of colon                       D12.3, Benign neoplasm of transverse colon (hepatic                         flexure or splenic flexure)                       D12.5, Benign neoplasm of sigmoid colon                       D12.0, Benign neoplasm of cecum                       D12.2, Benign neoplasm of ascending colon                       D12.4, Benign neoplasm of descending colon                       K64.0, First degree hemorrhoids CPT copyright 2016 American Medical Association. All rights reserved. The codes documented in this report are preliminary and upon coder review may  be revised to meet current compliance requirements. [name], MD [name] MD, MD 04/20/2017 10:24:53 AM This report has been signed electronically. Number of Addenda: 0 Note Initiated On: 04/20/2017 9:37 AM Scope Withdrawal Time: 0 hours 31 minutes 24 seconds  Total Procedure Duration: 0 hours 37 minutes 30 seconds       [facility]

## 2017-04-20 NOTE — Anesthesia Post-op Follow-up Note (Cosign Needed)
Anesthesia QCDR form completed.        

## 2017-04-20 NOTE — Transfer of Care (Signed)
Immediate Anesthesia Transfer of Care Note  Patient: Cynthia [name]  Procedure(s) Performed: Procedure(s): COLONOSCOPY WITH PROPOFOL (N/A)  Patient Location: PACU  Anesthesia Type:General  Level of Consciousness: awake and sedated  Airway & Oxygen Therapy: Patient Spontanous Breathing and Patient connected to nasal cannula oxygen  Post-op Assessment: Report given to RN and Post -op Vital signs reviewed and stable  Post vital signs: Reviewed and stable  Last Vitals:  Vitals:   04/20/17 0751  BP: 140/84  Pulse: 92  Resp: 20  Temp: (!) 36.3 C    Last Pain:  Vitals:   04/20/17 0751  TempSrc: Tympanic         Complications: No apparent anesthesia complications

## 2017-04-20 NOTE — Anesthesia Procedure Notes (Signed)
Performed by: COOK-MARTIN, Galilea Quito Pre-anesthesia Checklist: Patient identified, Emergency Drugs available, Suction available, Patient being monitored and Timeout performed Patient Re-evaluated:Patient Re-evaluated prior to induction Oxygen Delivery Method: Nasal cannula Preoxygenation: Pre-oxygenation with 100% oxygen Induction Type: IV induction Placement Confirmation: CO2 detector and positive ETCO2       

## 2017-04-20 NOTE — H&P (Signed)
[name] MD [address], [place], [place] [address] Phone: [phone] Fax : [phone]  Primary Care Physician:  Burnard Hawthorne, FNP Primary Gastroenterologist:  Dr. [name]   Pre-Procedure History & Physical: HPI:  Cynthia [name] is a 71 y.o. female is here for an colonoscopy.   Past Medical History:  Diagnosis Date  . Arthritis   . Coronary artery disease   . Diabetes mellitus without complication ([place])   . Hyperlipidemia   . Hypertension   . Lung nodule   . Thyroid disease     Past Surgical History:  Procedure Laterality Date  . BREAST BIOPSY Left 11/25/2015   stereo  neg  . BREAST EXCISIONAL BIOPSY Left yrs ago   benign  . THYROIDECTOMY     Dr. [name]  . VAGINAL DELIVERY     4    Prior to Admission medications   Medication Sig Start Date End Date Taking? Authorizing Provider  amLODipine (NORVASC) 10 MG tablet TAKE 1 TABLET (10 MG TOTAL) BY MOUTH DAILY. 02/15/17  Yes Burnard Hawthorne, FNP  aspirin 81 MG tablet Take 81 mg by mouth daily.   Yes [provider]  atorvastatin (LIPITOR) 20 MG tablet TAKE 1 TABLET (20 MG TOTAL) BY MOUTH DAILY. 01/18/17  Yes Burnard Hawthorne, FNP  BOOSTRIX 5-2.5-18.5 injection  05/17/16  Yes [provider]  Calcium Carbonate-Vitamin D 600-400 MG-UNIT per tablet Take 1 tablet by mouth daily.   Yes [provider]  gabapentin (NEURONTIN) 100 MG capsule TAKE 1 CAPSULE BY MOUTH THREE TIMES A DAY 04/12/17  Yes Arnett, Yvetta Coder, FNP  levothyroxine (SYNTHROID, LEVOTHROID) 137 MCG tablet TAKE 1 TABLET (137 MCG TOTAL) BY MOUTH DAILY BEFORE BREAKFAST. 01/18/17  Yes Arnett, Yvetta Coder, FNP  losartan-hydrochlorothiazide (HYZAAR) 50-12.5 MG tablet Take 1 tablet by mouth daily. 02/15/17  Yes Burnard Hawthorne, FNP  meloxicam (MOBIC) 15 MG tablet Take 1 tablet (15 mg total) by mouth daily. 02/15/17  Yes Burnard Hawthorne, FNP  metFORMIN (GLUCOPHAGE) 500 MG tablet TAKE 1 TABLET (500 MG TOTAL) BY MOUTH 2  (TWO) TIMES DAILY WITH A MEAL. 01/18/17  Yes Arnett, Yvetta Coder, FNP  metoprolol tartrate (LOPRESSOR) 25 MG tablet Take 1 tablet (25 mg total) by mouth 2 (two) times daily. 02/15/17  Yes Burnard Hawthorne, FNP  omeprazole (PRILOSEC) 20 MG capsule Take 1 capsule (20 mg total) by mouth daily. 11/15/16  Yes Burnard Hawthorne, FNP  benzonatate (TESSALON) 100 MG capsule Take 1 capsule (100 mg total) by mouth 2 (two) times daily as needed for cough. 02/15/17   Burnard Hawthorne, FNP  glucose blood (ONE TOUCH ULTRA TEST) test strip TEST BLOOD SUGAR 1-2 TIMES A DAY 02/15/17   Arnett, Yvetta Coder, FNP  [facility] DELICA LANCETS FINE MISC CHECK BLOOD GLUCOSE TWICE A DAY 02/15/17   Burnard Hawthorne, FNP  predniSONE (DELTASONE) 10 MG tablet Take 40 mg by mouth on day 1, then taper 10 mg daily until gone Patient not taking: Reported on 04/20/2017 11/15/16   Burnard Hawthorne, FNP    Allergies as of 03/09/2017  . (No Known Allergies)    Family History  Problem Relation Age of Onset  . Hypertension Mother   . Hypertension Father   . Diabetes Sister   . Thyroid disease Sister   . Breast cancer Neg Hx     Social History   Social History  . Marital status: Married    Spouse name: N/A  . Number  of children: N/A  . Years of education: N/A   Occupational History  . Not on file.   Social History Main Topics  . Smoking status: Former Smoker    Years: 20.00    Types: Cigarettes    Quit date: 11/17/2012  . Smokeless tobacco: Former Systems developer  . Alcohol use No  . Drug use: No  . Sexual activity: No   Other Topics Concern  . Not on file   Social History Narrative   Lives in [place] with husband. Has 4 children.      4 grandchildren.      Work - Retired from [employer]- walking the track, 4x per week      Diet- regular          Review of Systems: See HPI, otherwise negative ROS  Physical Exam: BP 140/84   Pulse 92   Temp (!) 97.3 F (36.3 C) (Tympanic)   Resp 20   Ht 5'  6" (1.676 m)   Wt 240 lb (108.9 kg)   SpO2 100%   BMI 38.74 kg/m  General:   Alert,  pleasant and cooperative in NAD Head:  Normocephalic and atraumatic. Neck:  Supple; no masses or thyromegaly. Lungs:  Clear throughout to auscultation.    Heart:  Regular rate and rhythm. Abdomen:  Soft, nontender and nondistended. Normal bowel sounds, without guarding, and without rebound.   Neurologic:  Alert and  oriented x4;  grossly normal neurologically.  Impression/Plan: Cynthia [name] is here for an colonoscopy to be performed for Screening colonoscopy average risk    Risks, benefits, limitations, and alternatives regarding  colonoscopy have been reviewed with the patient.  Questions have been answered.  All parties agreeable.   [name], MD  04/20/2017, 8:56 AM

## 2017-04-20 NOTE — Anesthesia Postprocedure Evaluation (Signed)
Anesthesia Post Note  Patient: Cynthia [name]  Procedure(s) Performed: Procedure(s) (LRB): COLONOSCOPY WITH PROPOFOL (N/A)  Patient location during evaluation: PACU Anesthesia Type: General Level of consciousness: awake Pain management: pain level controlled Vital Signs Assessment: post-procedure vital signs reviewed and stable Respiratory status: nonlabored ventilation Cardiovascular status: stable Anesthetic complications: no     Last Vitals:  Vitals:   04/20/17 1045 04/20/17 1055  BP: 130/88 (!) 135/94  Pulse: 72 (!) 131  Resp: 17 17  Temp:      Last Pain:  Vitals:   04/20/17 1025  TempSrc: Tympanic                 VAN STAVEREN,Denea Cheaney

## 2017-04-20 NOTE — Anesthesia Preprocedure Evaluation (Signed)
Anesthesia Evaluation  Patient identified by MRN, date of birth, ID band Patient awake    Reviewed: Allergy & Precautions, NPO status , Patient's Chart, lab work & pertinent test results  Airway Mallampati: II       Dental  (+) Upper Dentures, Lower Dentures   Pulmonary COPD, former smoker,     + decreased breath sounds      Cardiovascular Exercise Tolerance: Good hypertension, Pt. on medications and Pt. on home beta blockers + CAD   Rhythm:Regular Rate:Normal     Neuro/Psych negative neurological ROS  negative psych ROS   GI/Hepatic negative GI ROS, Neg liver ROS,   Endo/Other  diabetes, Type 2, Oral Hypoglycemic AgentsMorbid obesity  Renal/GU negative Renal ROS     Musculoskeletal   Abdominal (+) + obese,   Peds negative pediatric ROS (+)  Hematology   Anesthesia Other Findings   Reproductive/Obstetrics                             Anesthesia Physical Anesthesia Plan  ASA: III  Anesthesia Plan: General   Post-op Pain Management:    Induction: Intravenous  PONV Risk Score and Plan: 0  Airway Management Planned: Natural Airway and Nasal Cannula  Additional Equipment:   Intra-op Plan:   Post-operative Plan:   Informed Consent: I have reviewed the patients History and Physical, chart, labs and discussed the procedure including the risks, benefits and alternatives for the proposed anesthesia with the patient or authorized representative who has indicated his/her understanding and acceptance.     Plan Discussed with: CRNA  Anesthesia Plan Comments:         Anesthesia Quick Evaluation

## 2017-04-21 ENCOUNTER — Encounter: Payer: Self-pay | Admitting: Gastroenterology

## 2017-04-21 LAB — SURGICAL PATHOLOGY

## 2017-04-26 ENCOUNTER — Ambulatory Visit
Admission: RE | Admit: 2017-04-26 | Discharge: 2017-04-26 | Disposition: A | Discharge status: Home / Self Care | Payer: Medicare Other | Source: Ambulatory Visit | Attending: Internal Medicine | Admitting: Internal Medicine

## 2017-04-26 DIAGNOSIS — E2839 Other primary ovarian failure: Secondary | ICD-10-CM | POA: Insufficient documentation

## 2017-04-27 ENCOUNTER — Telehealth: Payer: Self-pay

## 2017-04-27 NOTE — Telephone Encounter (Signed)
-----   Message from [name], MD sent at 04/26/2017 12:06 PM EDT ----- Cynthia [name]   Inform patient - she had multiple polyps- pre cancerous and since the ascending colon polyp was large, multiple fragments- suggest colonoscopy in 6 months to ensure no fragments are left behind

## 2017-04-27 NOTE — Telephone Encounter (Signed)
LVM for patient callback to schedule repeat colonoscopy per Dr. Georgeann Oppenheim results.   Also, mailed letter with same information.   she had multiple polyps- pre cancerous and since the ascending colon polyp was large, multiple fragments- suggest colonoscopy in 6 months to ensure no fragments are left behind

## 2017-05-05 ENCOUNTER — Other Ambulatory Visit: Payer: Self-pay | Admitting: Family

## 2017-05-05 DIAGNOSIS — E1140 Type 2 diabetes mellitus with diabetic neuropathy, unspecified: Secondary | ICD-10-CM

## 2017-05-24 ENCOUNTER — Telehealth: Payer: Self-pay | Admitting: *Deleted

## 2017-05-24 DIAGNOSIS — E039 Hypothyroidism, unspecified: Secondary | ICD-10-CM

## 2017-05-24 DIAGNOSIS — E1140 Type 2 diabetes mellitus with diabetic neuropathy, unspecified: Secondary | ICD-10-CM

## 2017-05-24 DIAGNOSIS — M1711 Unilateral primary osteoarthritis, right knee: Secondary | ICD-10-CM

## 2017-05-24 MED ORDER — LEVOTHYROXINE SODIUM 137 MCG PO TABS: 137.0000 ug | ORAL_TABLET | Freq: Every day | ORAL | 1 refills | Status: DC

## 2017-05-24 MED ORDER — GABAPENTIN 100 MG PO CAPS: 100.0000 mg | ORAL_CAPSULE | Freq: Three times a day (TID) | ORAL | 0 refills | Status: DC

## 2017-05-24 MED ORDER — OMEPRAZOLE 20 MG PO CPDR: 20.0000 mg | DELAYED_RELEASE_CAPSULE | Freq: Every day | ORAL | 1 refills | Status: DC

## 2017-05-24 NOTE — Telephone Encounter (Signed)
Medication has been refilled.

## 2017-05-24 NOTE — Telephone Encounter (Signed)
Pt requested a medication refill for gabapentin , omeprazole and levothyroxine [facility]acy [facility] [place]

## 2017-06-02 ENCOUNTER — Telehealth: Payer: Self-pay | Admitting: Family

## 2017-06-02 NOTE — Telephone Encounter (Signed)
Tried to reach patient by phone for [place] busy no voicemail will call back.

## 2017-06-20 ENCOUNTER — Other Ambulatory Visit: Payer: Self-pay | Admitting: Family

## 2017-06-20 DIAGNOSIS — E1140 Type 2 diabetes mellitus with diabetic neuropathy, unspecified: Secondary | ICD-10-CM

## 2017-07-04 LAB — HM DIABETES EYE EXAM

## 2017-07-07 ENCOUNTER — Telehealth: Payer: Self-pay | Admitting: Family

## 2017-07-07 DIAGNOSIS — E039 Hypothyroidism, unspecified: Secondary | ICD-10-CM

## 2017-07-07 MED ORDER — LEVOTHYROXINE SODIUM 137 MCG PO TABS: 137.0000 ug | ORAL_TABLET | Freq: Every day | ORAL | 1 refills | Status: DC

## 2017-07-07 MED ORDER — METFORMIN HCL 500 MG PO TABS: ORAL_TABLET | ORAL | 1 refills | Status: DC

## 2017-07-07 NOTE — Telephone Encounter (Signed)
Pt would like a refill for metFORMIN (GLUCOPHAGE) 500 MG tablet and levothyroxine (SYNTHROID, LEVOTHROID) 137 MCG tablet.  [id number] - [place], [place] - [address]  Call pt @ [phone]. Thank you!

## 2017-07-07 NOTE — Telephone Encounter (Signed)
Medication has been refilled.

## 2017-07-10 ENCOUNTER — Other Ambulatory Visit: Payer: Self-pay | Admitting: Family

## 2017-07-10 DIAGNOSIS — E039 Hypothyroidism, unspecified: Secondary | ICD-10-CM

## 2017-07-11 ENCOUNTER — Telehealth: Payer: Self-pay | Admitting: Family

## 2017-07-11 DIAGNOSIS — E1140 Type 2 diabetes mellitus with diabetic neuropathy, unspecified: Secondary | ICD-10-CM

## 2017-07-11 MED ORDER — GABAPENTIN 100 MG PO CAPS: ORAL_CAPSULE | ORAL | 0 refills | Status: DC

## 2017-07-11 NOTE — Telephone Encounter (Signed)
Pt called requesting a refill on her gabapentin (NEURONTIN) 100 MG capsule. Please advise, thank you!  Pharmacy - [id number] - [place], [place] - [address]

## 2017-07-18 ENCOUNTER — Encounter: Payer: Self-pay | Admitting: Family

## 2017-07-18 ENCOUNTER — Ambulatory Visit (INDEPENDENT_AMBULATORY_CARE_PROVIDER_SITE_OTHER): Payer: Medicare Other | Admitting: Family

## 2017-07-18 VITALS — BP 112/70 | HR 86 | Temp 97.9°F | Ht 68.0 in | Wt 247.4 lb

## 2017-07-18 DIAGNOSIS — I10 Essential (primary) hypertension: Secondary | ICD-10-CM | POA: Diagnosis not present

## 2017-07-18 DIAGNOSIS — Z23 Encounter for immunization: Secondary | ICD-10-CM | POA: Diagnosis not present

## 2017-07-18 DIAGNOSIS — E1140 Type 2 diabetes mellitus with diabetic neuropathy, unspecified: Secondary | ICD-10-CM

## 2017-07-18 DIAGNOSIS — M1990 Unspecified osteoarthritis, unspecified site: Secondary | ICD-10-CM | POA: Diagnosis not present

## 2017-07-18 DIAGNOSIS — E039 Hypothyroidism, unspecified: Secondary | ICD-10-CM | POA: Diagnosis not present

## 2017-07-18 LAB — BASIC METABOLIC PANEL
BUN: 14 mg/dL (ref 6–23)
CO2: 30 meq/L (ref 19–32)
Calcium: 9.8 mg/dL (ref 8.4–10.5)
Chloride: 103 mEq/L (ref 96–112)
Creatinine, Ser: 0.86 mg/dL (ref 0.40–1.20)
GFR: 83.47 mL/min (ref >60.00)
GLUCOSE: 127 mg/dL — AB (ref 70–99)
POTASSIUM: 3.6 meq/L (ref 3.5–5.1)
SODIUM: 141 meq/L (ref 135–145)

## 2017-07-18 LAB — HEMOGLOBIN A1C: Hgb A1c MFr Bld: 6.5 % (ref 4.6–6.5)

## 2017-07-18 MED ORDER — MELOXICAM 15 MG PO TABS: 15.0000 mg | ORAL_TABLET | Freq: Every day | ORAL | 1 refills | Status: DC

## 2017-07-18 NOTE — Progress Notes (Signed)
Subjective:    Patient ID: Cynthia [name], female    DOB: April 02, 1946, 71 y.o.   MRN: [id number]  CC: Cynthia [name] is a 71 y.o. female who presents today for follow up.   HPI: DM- FBG- 116. Compliant with metformin.   HTN- compliant with medications. Thinks salty meal has elevated today.Denies exertional chest pain or pressure, numbness or tingling radiating to left arm or jaw, palpitations, dizziness, frequent headaches, changes in vision, or shortness of breath.   Arthritis- mobic works well  Hypothyroid- no cold/ heat intolerance. Compliant with synthroid    HISTORY:  Past Medical History:  Diagnosis Date  . Arthritis   . Coronary artery disease   . Diabetes mellitus without complication ([place])   . Hyperlipidemia   . Hypertension   . Lung nodule   . Thyroid disease    Past Surgical History:  Procedure Laterality Date  . BREAST BIOPSY Left 11/25/2015   stereo  neg  . BREAST EXCISIONAL BIOPSY Left yrs ago   benign  . COLONOSCOPY WITH PROPOFOL N/A 04/20/2017   Procedure: COLONOSCOPY WITH PROPOFOL;  Surgeon: Cynthia Bellows, MD;  [facility];  Service: Endoscopy;  Laterality: N/A;  . THYROIDECTOMY     Dr. Leanora [name]  . VAGINAL DELIVERY     4   Family History  Problem Relation Age of Onset  . Hypertension Mother   . Hypertension Father   . Diabetes Sister   . Thyroid disease Sister   . Breast cancer Neg Hx     Allergies: Patient has no known allergies. Current Outpatient Prescriptions on File Prior to Visit  Medication Sig Dispense Refill  . amLODipine (NORVASC) 10 MG tablet TAKE 1 TABLET (10 MG TOTAL) BY MOUTH DAILY. 90 tablet 3  . aspirin 81 MG tablet Take 81 mg by mouth daily.    Marland Kitchen atorvastatin (LIPITOR) 20 MG tablet TAKE 1 TABLET (20 MG TOTAL) BY MOUTH DAILY. 90 tablet 1  . BOOSTRIX 5-2.5-18.5 injection     . Calcium Carbonate-Vitamin D 600-400 MG-UNIT per tablet Take 1 tablet by mouth daily.    Marland Kitchen gabapentin (NEURONTIN) 100 MG capsule TAKE 1 CAPSULE  BY MOUTH THREE TIMES A DAY 90 capsule 0  . glucose blood (ONE TOUCH ULTRA TEST) test strip TEST BLOOD SUGAR 1-2 TIMES A DAY 100 each 4  . levothyroxine (SYNTHROID, LEVOTHROID) 137 MCG tablet Take 1 tablet (137 mcg total) by mouth daily before breakfast. 90 tablet 1  . levothyroxine (SYNTHROID, LEVOTHROID) 137 MCG tablet TAKE 1 TABLET (137 MCG TOTAL) BY MOUTH DAILY BEFORE BREAKFAST. 90 tablet 1  . losartan-hydrochlorothiazide (HYZAAR) 50-12.5 MG tablet Take 1 tablet by mouth daily. 90 tablet 1  . metFORMIN (GLUCOPHAGE) 500 MG tablet TAKE 1 TABLET (500 MG TOTAL) BY MOUTH 2 (TWO) TIMES DAILY WITH A MEAL. 180 tablet 1  . metoprolol tartrate (LOPRESSOR) 25 MG tablet Take 1 tablet (25 mg total) by mouth 2 (two) times daily. 90 tablet 1  . omeprazole (PRILOSEC) 20 MG capsule Take 1 capsule (20 mg total) by mouth daily. 90 capsule 1  . ONETOUCH DELICA LANCETS FINE MISC CHECK BLOOD GLUCOSE TWICE A DAY 100 each 3   No current facility-administered medications on file prior to visit.     Social History  Substance Use Topics  . Smoking status: Former Smoker    Years: 20.00    Types: Cigarettes    Quit date: 11/17/2012  . Smokeless tobacco: Former Systems developer  . Alcohol use No  Review of Systems  Constitutional: Negative for chills and fever.  Respiratory: Negative for cough.   Cardiovascular: Negative for chest pain and palpitations.  Gastrointestinal: Negative for nausea and vomiting.  Endocrine: Negative for cold intolerance and heat intolerance.      Objective:    BP 112/70   Pulse 86   Temp 97.9 F (36.6 C) (Oral)   Ht 5\' 8"  (1.727 m)   Wt 247 lb 6.4 oz (112.2 kg)   SpO2 96%   BMI 37.62 kg/m  BP Readings from Last 3 Encounters:  07/19/17 112/70  04/20/17 (!) 135/94  04/07/17 122/68   Wt Readings from Last 3 Encounters:  07/18/17 247 lb 6.4 oz (112.2 kg)  04/20/17 240 lb (108.9 kg)  04/07/17 248 lb 12.8 oz (112.9 kg)    Physical Exam  Constitutional: She appears well-developed  and well-nourished.  Eyes: Conjunctivae are normal.  Cardiovascular: Normal rate, regular rhythm, normal heart sounds and normal pulses.   Pulmonary/Chest: Effort normal and breath sounds normal. She has no wheezes. She has no rhonchi. She has no rales.  Neurological: She is alert.  Skin: Skin is warm and dry.  Psychiatric: She has a normal mood and affect. Her speech is normal and behavior is normal. Thought content normal.  Vitals reviewed.      Assessment & Plan:   Problem List Items Addressed This Visit      Cardiovascular and Mediastinum   Hypertension    At goal. Continue current regimen      Relevant Medications   meloxicam (MOBIC) 15 MG tablet   Other Relevant Orders   Basic metabolic panel (Completed)     Endocrine   Type 2 diabetes mellitus with diabetic neuropathy, unspecified ([place]) - Primary    At goal. Continue current regimen.       Relevant Orders   Hemoglobin A1c (Completed)   Hypothyroidism    Asymptomatic. Will draw tsh at follow up.         Musculoskeletal and Integument   Arthritis    Stable on mobic. Will continue.       Relevant Medications   meloxicam (MOBIC) 15 MG tablet    Other Visit Diagnoses    Encounter for immunization       Relevant Orders   Flu vaccine HIGH DOSE PF (Completed)       I have discontinued Cynthia [name]'s predniSONE and benzonatate. I am also having her maintain her Calcium Carbonate-Vitamin D, aspirin, BOOSTRIX, amLODipine, losartan-hydrochlorothiazide, metoprolol tartrate, glucose blood, ONETOUCH DELICA LANCETS FINE, omeprazole, metFORMIN, levothyroxine, atorvastatin, levothyroxine, gabapentin, and meloxicam.   Meds ordered this encounter  Medications  . meloxicam (MOBIC) 15 MG tablet    Sig: Take 1 tablet (15 mg total) by mouth daily.    Dispense:  90 tablet    Refill:  1    Return precautions given.   Risks, benefits, and alternatives of the medications and treatment plan prescribed today were discussed,  and patient expressed understanding.   Education regarding symptom management and diagnosis given to patient on AVS.  Continue to follow with Cynthia Hawthorne, FNP for routine health maintenance.   Cynthia [name] and I agreed with plan.   Mable Paris, FNP

## 2017-07-18 NOTE — Patient Instructions (Signed)
Labs today  Monitor blood pressure,  Goal is less than 130/80; if persistently higher, please make sooner follow up appointment so we can recheck you blood pressure and manage medications  Pleasure seeing  You!

## 2017-07-18 NOTE — Progress Notes (Signed)
Pre visit review using our clinic review tool, if applicable. No additional management support is needed unless otherwise documented below in the visit note. 

## 2017-07-19 NOTE — Assessment & Plan Note (Signed)
At goal. Continue current regimen. 

## 2017-07-19 NOTE — Assessment & Plan Note (Signed)
Stable on mobic. Will continue.

## 2017-07-19 NOTE — Assessment & Plan Note (Signed)
Asymptomatic. Will draw tsh at follow up.

## 2017-07-20 ENCOUNTER — Telehealth: Payer: Self-pay | Admitting: Family

## 2017-07-20 NOTE — Telephone Encounter (Signed)
Patient was informed of results.  Patient understood and no questions, comments, or concerns at this time.  

## 2017-07-20 NOTE — Telephone Encounter (Signed)
Cynthia [name] left a message for you to return her call . She stated [name] left her a message.

## 2017-07-28 ENCOUNTER — Ambulatory Visit (INDEPENDENT_AMBULATORY_CARE_PROVIDER_SITE_OTHER): Payer: Medicare Other

## 2017-07-28 ENCOUNTER — Encounter: Payer: Self-pay | Admitting: Internal Medicine

## 2017-07-28 ENCOUNTER — Ambulatory Visit (INDEPENDENT_AMBULATORY_CARE_PROVIDER_SITE_OTHER): Payer: Medicare Other | Admitting: Internal Medicine

## 2017-07-28 VITALS — BP 110/66 | HR 85 | Temp 98.1°F | Resp 15 | Ht 68.0 in | Wt 248.8 lb

## 2017-07-28 DIAGNOSIS — E1169 Type 2 diabetes mellitus with other specified complication: Secondary | ICD-10-CM

## 2017-07-28 DIAGNOSIS — E785 Hyperlipidemia, unspecified: Secondary | ICD-10-CM

## 2017-07-28 DIAGNOSIS — M79672 Pain in left foot: Secondary | ICD-10-CM

## 2017-07-28 IMAGING — DX DG FOOT COMPLETE 3+V*L*
3 series · 3 of 3 positions shown · non-contrast
Comparison: None.

CLINICAL DATA: Pain to the forefoot

EXAM:
LEFT FOOT - COMPLETE 3+ VIEW

[foot ap]
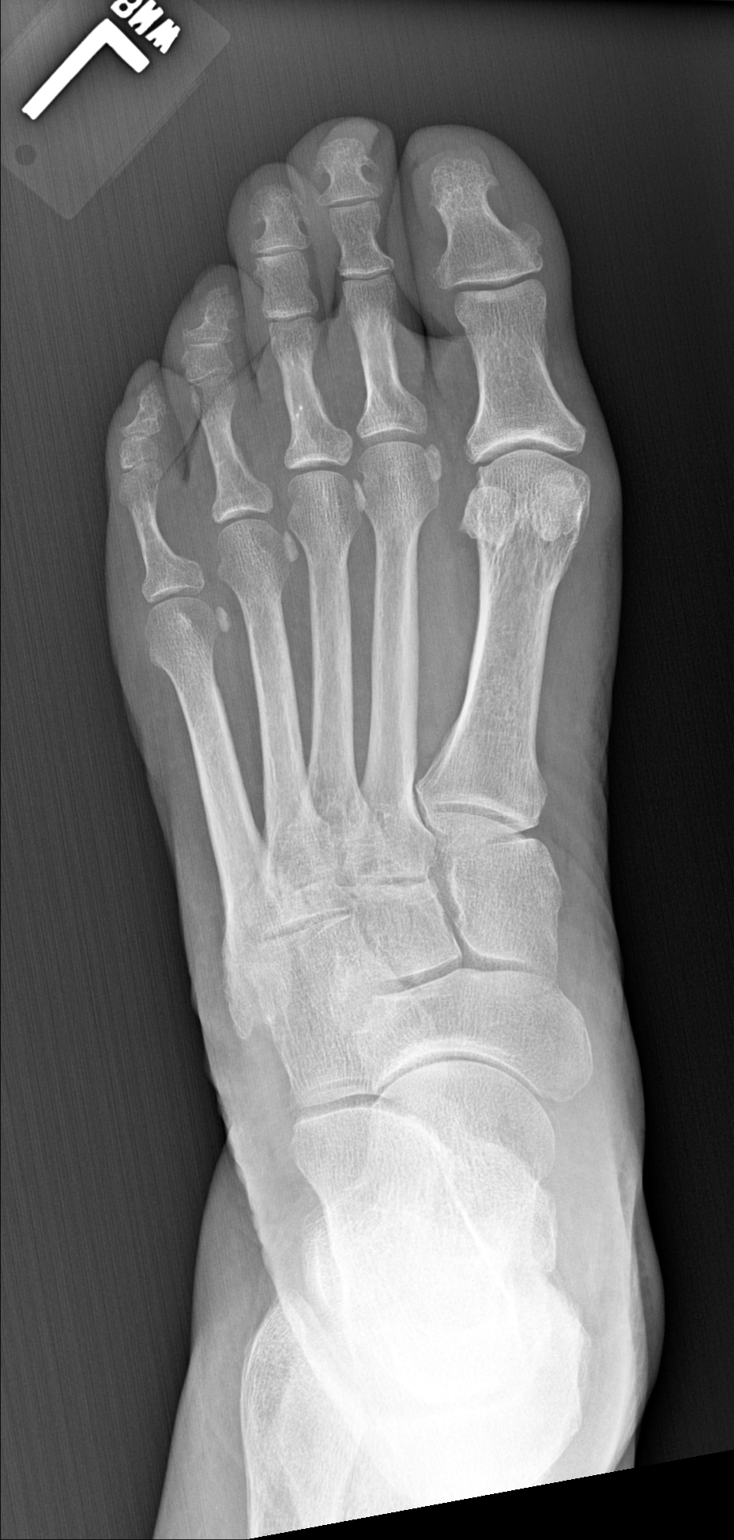

[foot obl (oblique)]
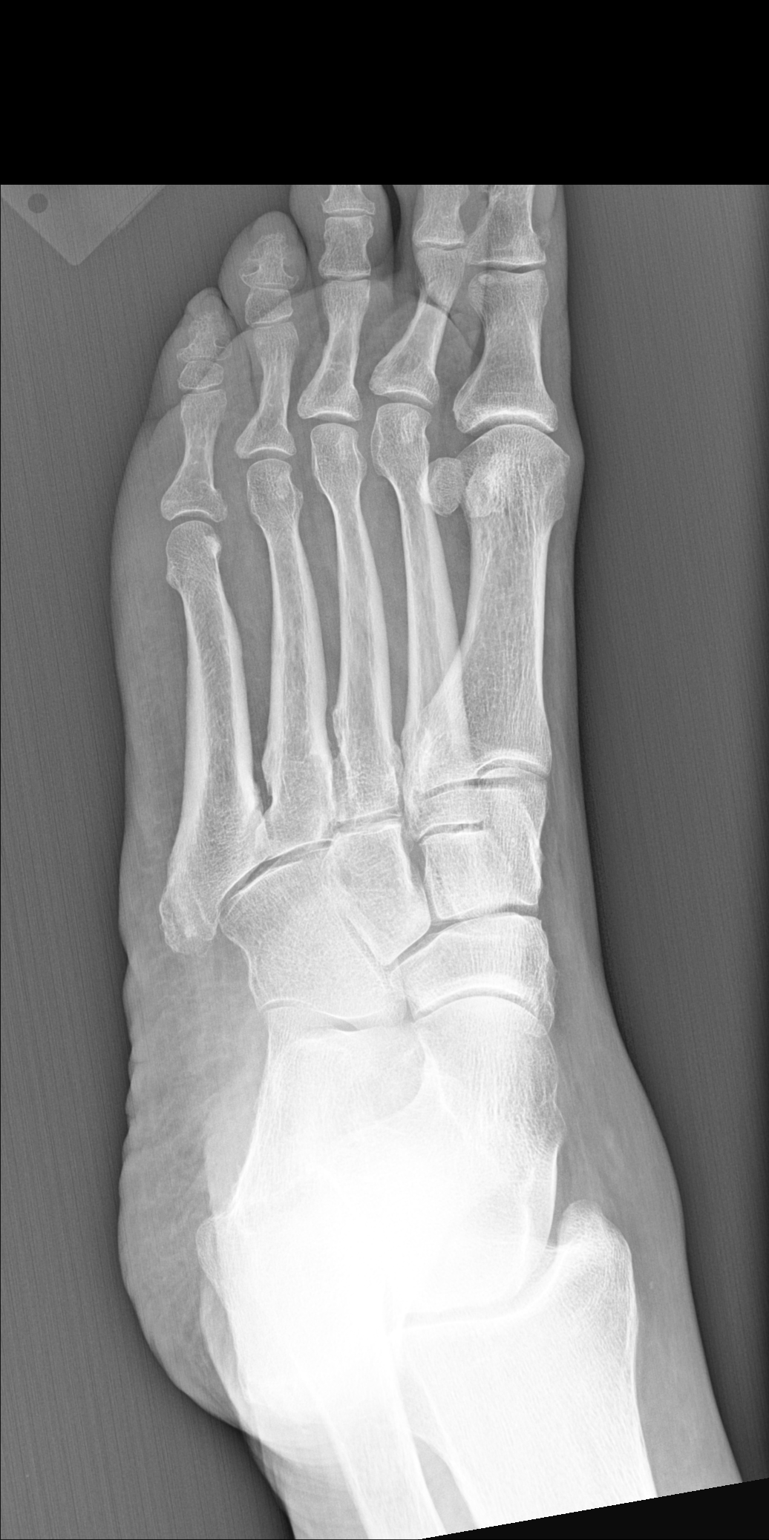

[foot lat]
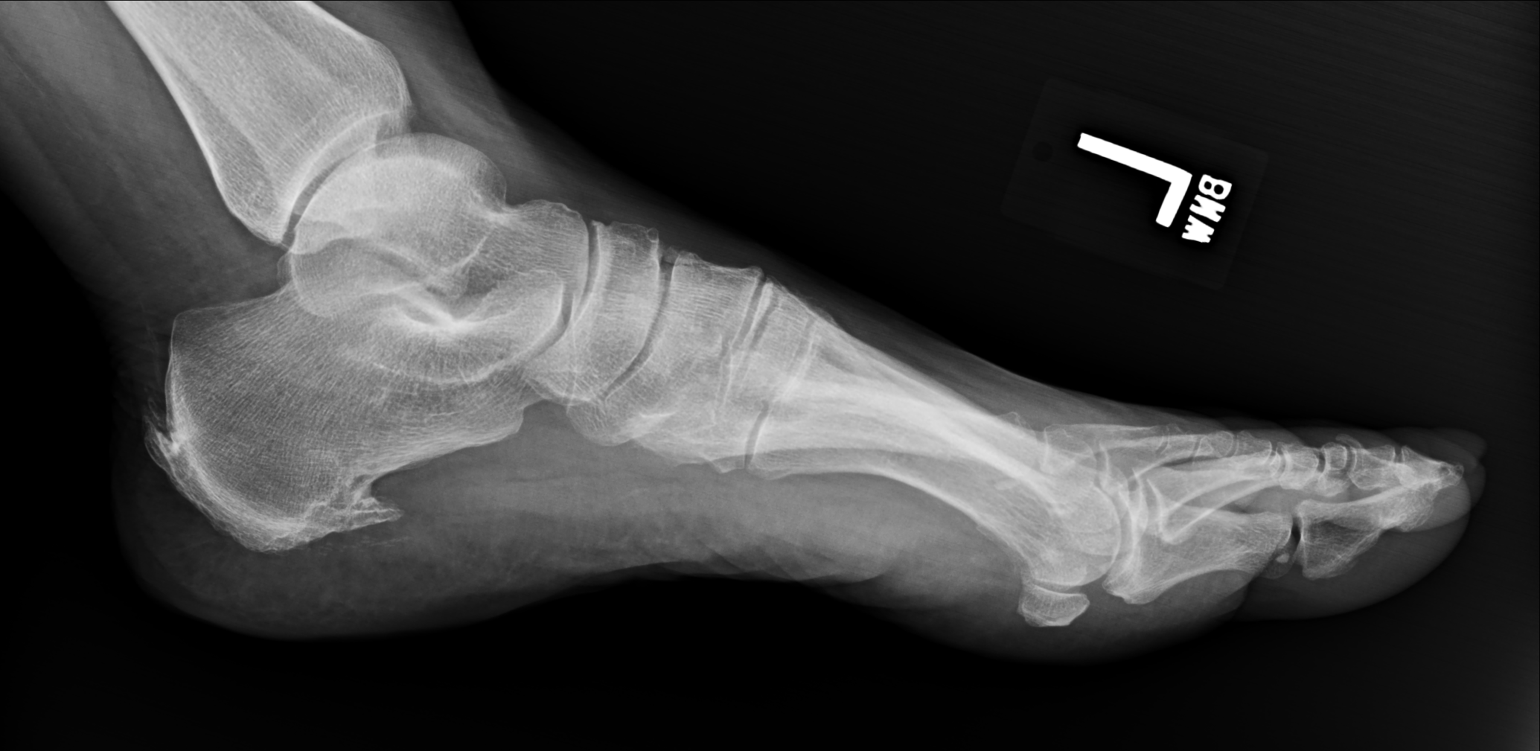

[3 of 3 positions shown; findings below may reference images not displayed]

FINDINGS: No fracture or malalignment. Moderate to large plantar calcaneal
spur. Mild degenerative changes at the first MTP joint
IMPRESSION: 1. No acute osseous abnormality
2. Mild degenerative changes at the first MTP joint
3. Large plantar calcaneal spur

## 2017-07-28 NOTE — Patient Instructions (Signed)
Your foot pain may be from gout or arthritis  The x rays and labs will help me determine the cause   You can increase the gabapentin to 200 mg in the morning or in the afternoon for the burning   You can add tylenol up to 2000 mg daily to the mobic for pain

## 2017-07-28 NOTE — Progress Notes (Signed)
Subjective:  Patient ID: Cynthia [name], female    DOB: March 23, 1946  Age: 71 y.o. MRN: [id number]  CC: The primary encounter diagnosis was Foot pain, left. Diagnoses of Hyperlipidemia associated with type 2 diabetes mellitus ([place]) and Acute pain of left foot were also pertinent to this visit.  HPI Cynthia [name] presents for right foot pain that has been present since Wednesday.  The pain is localized to the top of the midfoot and the lateral side of the forefoot and midfoot. She has no history of fall or unusual activity . Denies redness and warmth to the foot or joints,  No open wounds. S She walks on a regular basis for exercise and has had to stop.  She has been  using an OTC arthritis cream which has relieved the pain to a moderate degree.  The severity of the pain is better compared to Wednesday   Outpatient Medications Prior to Visit  Medication Sig Dispense Refill  . amLODipine (NORVASC) 10 MG tablet TAKE 1 TABLET (10 MG TOTAL) BY MOUTH DAILY. 90 tablet 3  . aspirin 81 MG tablet Take 81 mg by mouth daily.    Marland Kitchen atorvastatin (LIPITOR) 20 MG tablet TAKE 1 TABLET (20 MG TOTAL) BY MOUTH DAILY. 90 tablet 1  . BOOSTRIX 5-2.5-18.5 injection     . Calcium Carbonate-Vitamin D 600-400 MG-UNIT per tablet Take 1 tablet by mouth daily.    Marland Kitchen gabapentin (NEURONTIN) 100 MG capsule TAKE 1 CAPSULE BY MOUTH THREE TIMES A DAY 90 capsule 0  . glucose blood (ONE TOUCH ULTRA TEST) test strip TEST BLOOD SUGAR 1-2 TIMES A DAY 100 each 4  . levothyroxine (SYNTHROID, LEVOTHROID) 137 MCG tablet Take 1 tablet (137 mcg total) by mouth daily before breakfast. 90 tablet 1  . levothyroxine (SYNTHROID, LEVOTHROID) 137 MCG tablet TAKE 1 TABLET (137 MCG TOTAL) BY MOUTH DAILY BEFORE BREAKFAST. 90 tablet 1  . losartan-hydrochlorothiazide (HYZAAR) 50-12.5 MG tablet Take 1 tablet by mouth daily. 90 tablet 1  . meloxicam (MOBIC) 15 MG tablet Take 1 tablet (15 mg total) by mouth daily. 90 tablet 1  . metFORMIN (GLUCOPHAGE)  500 MG tablet TAKE 1 TABLET (500 MG TOTAL) BY MOUTH 2 (TWO) TIMES DAILY WITH A MEAL. 180 tablet 1  . metoprolol tartrate (LOPRESSOR) 25 MG tablet Take 1 tablet (25 mg total) by mouth 2 (two) times daily. 90 tablet 1  . omeprazole (PRILOSEC) 20 MG capsule Take 1 capsule (20 mg total) by mouth daily. 90 capsule 1  . ONETOUCH DELICA LANCETS FINE MISC CHECK BLOOD GLUCOSE TWICE A DAY 100 each 3   No facility-administered medications prior to visit.     Review of Systems;  Patient denies headache, fevers, malaise, unintentional weight loss, skin rash, eye pain, sinus congestion and sinus pain, sore throat, dysphagia,  hemoptysis , cough, dyspnea, wheezing, chest pain, palpitations, orthopnea, edema, abdominal pain, nausea, melena, diarrhea, constipation, flank pain, dysuria, hematuria, urinary  Frequency, nocturia, numbness, tingling, seizures,  Focal weakness, Loss of consciousness,  Tremor, insomnia, depression, anxiety, and suicidal ideation.      Objective:  BP 110/66 (BP Location: Left Arm, Patient Position: Sitting, Cuff Size: Normal)   Pulse 85   Temp 98.1 F (36.7 C) (Oral)   Resp 15   Ht 5\' 8"  (1.727 m)   Wt 248 lb 12.8 oz (112.9 kg)   SpO2 96%   BMI 37.83 kg/m   BP Readings from Last 3 Encounters:  07/28/17 110/66  07/19/17 112/70  04/20/17 Marland Kitchen)  135/94    Wt Readings from Last 3 Encounters:  07/28/17 248 lb 12.8 oz (112.9 kg)  07/18/17 247 lb 6.4 oz (112.2 kg)  04/20/17 240 lb (108.9 kg)    General appearance: alert, cooperative and appears stated age Neck: no adenopathy, no carotid bruit, supple, symmetrical, trachea midline and thyroid not enlarged, symmetric, no tenderness/mass/nodules Back: symmetric, no curvature. ROM normal. No CVA tenderness. Lungs: clear to auscultation bilaterally Heart: regular rate and rhythm, S1, S2 normal, no murmur, click, rub or gallop Pulses: 2+ and symmetric Skin: Skin color, texture, turgor normal. No rashes or lesions Lymph nodes:  Cervical, supraclavicular, and axillary nodes normal. MSK:  No point tenderness .  No joint warmth or synovitis.  Lab Results  Component Value Date   HGBA1C 6.5 07/18/2017   HGBA1C 6.2 02/17/2017   HGBA1C 6.4 08/15/2016    Lab Results  Component Value Date   CREATININE 0.86 07/18/2017   CREATININE 0.79 02/15/2017   CREATININE 0.83 10/09/2016    Lab Results  Component Value Date   WBC 7.4 10/09/2016   HGB 14.3 10/09/2016   HCT 40.9 10/09/2016   PLT 273 10/09/2016   GLUCOSE 127 (H) 07/18/2017   CHOL 146 07/28/2017   TRIG 76 07/28/2017   HDL 64 07/28/2017   LDLCALC 70 10/21/2015   ALT 23 02/15/2017   AST 22 02/15/2017   NA 141 07/18/2017   K 3.6 07/18/2017   CL 103 07/18/2017   CREATININE 0.86 07/18/2017   BUN 14 07/18/2017   CO2 30 07/18/2017   TSH 1.19 02/15/2017   HGBA1C 6.5 07/18/2017   MICROALBUR 3.7 (H) 10/21/2015    Dex[place]an  Result Date: 04/27/2017 EXAM: DUAL X-RAY ABSORPTIOMETRY (DXA) FOR BONE MINERAL DENSITY IMPRESSION: Dear Dr. Vidal Schwalbe, Your patient Cynthia [name] completed a BMD test on 04/26/2017 using the [place] (analysis version: 14.10) manufactured by [employer]. The following summarizes the results of our evaluation. PATIENT BIOGRAPHICAL: Name: Cynthia, [name] Patient ID: (not specified) Birth Date: [birth year] Height: 67.0 in. Gender: Female Exam Date: 04/26/2017 Weight: 242.0 lbs. Indications: Diabetic, Postmenopausal, Thyroid Removal Fractures: Treatments: Calcium, Synthroid ASSESSMENT: The BMD measured at Femur Neck Right is 0.930 g/cm2 with a T-score of -0.8. This patient is considered normal according to [facility]) criteria. Lumbar spine was not utilized due to advanced degenerative changes. Site Region Measured Measured WHO Young Adult BMD Date       Age      Classification T-score DualFemur Neck Right 04/26/[age] 71.5 Normal -0.8 0.930 g/cm2 Left Forearm Radius 33% 04/26/2017 71.5 Normal 0.1 0.883 g/cm2 World Health  Organization [facility]) criteria for post-menopausal, Caucasian Women: Normal:       T-score at or above -1 SD Osteopenia:   T-score between -1 and -2.5 SD Osteoporosis: T-score at or below -2.5 SD RECOMMENDATIONS: [place] recommends that FDA-approved medical therapies be considered in postmenopausal women and men age 48 or older with a: 1. Hip or vertebral (clinical or morphometric) fracture. 2. T-score of < -2.5 at the spine or hip. 3. Ten-year fracture probability by FRAX of 3% or greater for hip fracture or 20% or greater for major osteoporotic fracture. All treatment decisions require clinical judgment and consideration of individual patient factors, including patient preferences, co-morbidities, previous drug use, risk factors not captured in the FRAX model (e.g. falls, vitamin D deficiency, increased bone turnover, interval significant decline in bone density) and possible under - or over-estimation of fracture risk by FRAX. All patients should ensure  an adequate intake of dietary calcium (1200 mg/d) and vitamin D (800 IU daily) unless contraindicated. FOLLOW-UP: People with diagnosed cases of osteoporosis or at high risk for fracture should have regular bone mineral density tests. For patients eligible for Medicare, routine testing is allowed once every 2 years. The testing frequency can be increased to one year for patients who have rapidly progressing disease, those who are receiving or discontinuing medical therapy to restore bone mass, or have additional risk factors. I have reviewed this report, and agree with the above findings. [facility]iology Electronically Signed   By: Lowella Grip III M.D.   On: 04/27/2017 11:55    Assessment & Plan:   Problem List Items Addressed This Visit    Acute pain of left foot    Foot exam is normal; and there is no sign of fracture by plain films or of infection or inflammation by labs. she has a large calcaneal spur which may be  causing tendonitis, and degenerative changes at the first MTP joint .  Recommend continued use of arthritis cream and podiatry referral if pain persists.   Lab Results  Component Value Date   ESRSEDRATE 11 07/28/2017   Lab Results  Component Value Date   CRP 0.9 07/28/2017   Lab Results  Component Value Date   LABURIC 6.2 07/28/2017          Other Visit Diagnoses    Foot pain, left    -  Primary   Relevant Orders   DG Foot Complete Left (Completed)   Hyperlipidemia associated with type 2 diabetes mellitus (HCC)       Relevant Orders   Uric acid (Completed)   Sedimentation rate (Completed)   Lipid panel (Completed)   C-reactive protein (Completed)     A total of 25 minutes of face to face time was spent with patient more than half of which was spent in counselling about the above mentioned conditions  and coordination of care  I am having Topaz A. Dyckman maintain her Calcium Carbonate-Vitamin D, aspirin, BOOSTRIX, amLODipine, losartan-hydrochlorothiazide, metoprolol tartrate, glucose blood, ONETOUCH DELICA LANCETS FINE, omeprazole, metFORMIN, levothyroxine, atorvastatin, levothyroxine, gabapentin, and meloxicam.  No orders of the defined types were placed in this encounter.   There are no discontinued medications.  Follow-up: No Follow-up on file.   Crecencio Mc, MD

## 2017-07-29 LAB — LIPID PANEL
CHOLESTEROL: 146 mg/dL (ref <200)
HDL: 64 mg/dL (ref >50)
LDL CHOLESTEROL (CALC): 66 mg/dL
Non-HDL Cholesterol (Calc): 82 mg/dL (calc) (ref <130)
TRIGLYCERIDES: 76 mg/dL (ref <150)
Total CHOL/HDL Ratio: 2.3 (calc) (ref <5.0)

## 2017-07-29 LAB — C-REACTIVE PROTEIN: CRP: 0.9 mg/L (ref <8.0)

## 2017-07-29 LAB — URIC ACID: Uric Acid, Serum: 6.2 mg/dL (ref 2.5–7.0)

## 2017-07-29 LAB — SEDIMENTATION RATE: SED RATE: 11 mm/h (ref 0–30)

## 2017-07-30 DIAGNOSIS — M79672 Pain in left foot: Secondary | ICD-10-CM | POA: Insufficient documentation

## 2017-07-30 NOTE — Assessment & Plan Note (Addendum)
Foot exam is normal; and there is no sign of fracture by plain films or of infection or inflammation by labs. she has a large calcaneal spur which may be causing tendonitis, and degenerative changes at the first MTP joint .  Recommend continued use of arthritis cream and podiatry referral if pain persists.   Lab Results  Component Value Date   ESRSEDRATE 11 07/28/2017   Lab Results  Component Value Date   CRP 0.9 07/28/2017   Lab Results  Component Value Date   LABURIC 6.2 07/28/2017

## 2017-08-01 ENCOUNTER — Telehealth: Payer: Self-pay | Admitting: Family

## 2017-08-01 NOTE — Progress Notes (Signed)
Results given.

## 2017-08-01 NOTE — Telephone Encounter (Signed)
-----   Message from Adair Laundry, [place] sent at 08/01/2017  8:45 AM EST ----- LMTCB

## 2017-08-01 NOTE — Telephone Encounter (Signed)
Results given.

## 2017-08-03 ENCOUNTER — Telehealth: Payer: Self-pay | Admitting: Family

## 2017-08-03 NOTE — Telephone Encounter (Signed)
Need order to disclose lab results.

## 2017-08-03 NOTE — Telephone Encounter (Signed)
You may give results

## 2017-08-04 ENCOUNTER — Telehealth: Payer: Self-pay | Admitting: Family

## 2017-08-04 NOTE — Telephone Encounter (Signed)
Pt given results of labs per Dr Derrel Nip; Pt verbalizes understanding

## 2017-08-23 ENCOUNTER — Telehealth: Payer: Self-pay | Admitting: Family

## 2017-08-23 ENCOUNTER — Other Ambulatory Visit: Payer: Self-pay

## 2017-08-23 DIAGNOSIS — E1140 Type 2 diabetes mellitus with diabetic neuropathy, unspecified: Secondary | ICD-10-CM

## 2017-08-23 MED ORDER — GABAPENTIN 100 MG PO CAPS: ORAL_CAPSULE | ORAL | 0 refills | Status: DC

## 2017-08-23 NOTE — Telephone Encounter (Signed)
Copied from [place]. Topic: Quick Communication - Rx Refill/Question >> Aug 23, 2017  9:39 AM Carolyn Stare wrote: Has the patient contacted their pharmacy   no more refills    RX  gabapentin (NEURONTIN) 100 MG capsule  Preferred Pharmacy (with phone number or street name   [facility]: Please be advised that RX refills may take up to 3 business days. We ask that you follow-up with your pharmacy.

## 2017-08-23 NOTE — Telephone Encounter (Signed)
Please advise. Pt was originally ordered this 10/17/16 but each time med has been rreordered and with no refills. Pt has upcoming appt 11/06/17. Please advise if can be reordered.

## 2017-08-23 NOTE — Telephone Encounter (Signed)
Script sent to pharmacy.

## 2017-08-23 NOTE — Telephone Encounter (Signed)
Copied from [place] [phone]. Topic: Quick Communication - Rx Refill/Question >> Aug 23, 2017  9:43 AM Carolyn Stare wrote: Has the patient contacted their pharmacy   NO MORE REFILLS  RX gabapentin (NEURONTIN) 100 MG capsule  Preferred Pharmacy (with phone number or street name  [facility] [place]: Please be advised that RX refills may take up to 3 business days. We ask that you follow-up with your pharmacy.

## 2017-09-15 ENCOUNTER — Ambulatory Visit: Payer: Self-pay | Admitting: *Deleted

## 2017-09-15 NOTE — Telephone Encounter (Signed)
Pt states that she has right hip pain which sometimes moves to the left hip; nurse triage initiated and per protocol recommendation was made that the pt see a physician within 3 days; due to her schedule constraints pt unable to schedule appointment at [facility] nor does she want to be seen in another location; pt states that she has been taking her gabapentin and will resume meloxicam; she further states that she will call back next week if her pain is not relieved.   Reason for Disposition . [1] MODERATE pain (e.g., interferes with normal activities, limping) AND [2] present > 3 days  Answer Assessment - Initial Assessment Questions 1. LOCATION and RADIATION: "Where is the pain located?"      Right hip 2. QUALITY: "What does the pain feel like?"  (e.g., sharp, dull, aching, burning)     sharp 3. SEVERITY: "How bad is the pain?" "What does it keep you from doing?"   (Scale 1-10; or mild, moderate, severe)   -  MILD (1-3): doesn't interfere with normal activities    -  MODERATE (4-7): interferes with normal activities (e.g., work or school) or awakens from sleep, limping    -  SEVERE (8-10): excruciating pain, unable to do any normal activities, unable to walk     moderate 4. ONSET: "When did the pain start?" "Does it come and go, or is it there all the time?"     constant 5. WORK OR EXERCISE: "Has there been any recent work or exercise that involved this part of the body?"      no 6. CAUSE: "What do you think is causing the hip pain?"      unsure 7. AGGRAVATING FACTORS: "What makes the hip pain worse?" (e.g., walking, climbing stairs, running)     Walking and climbing stairs 8. OTHER SYMPTOMS: "Do you have any other symptoms?" (e.g., back pain, pain shooting down leg,  fever, rash)     no  Protocols used: HIP PAIN-A-AH

## 2017-09-19 DIAGNOSIS — C801 Malignant (primary) neoplasm, unspecified: Secondary | ICD-10-CM

## 2017-09-19 HISTORY — DX: Malignant (primary) neoplasm, unspecified: C80.1

## 2017-09-22 ENCOUNTER — Other Ambulatory Visit: Payer: Self-pay | Admitting: Family

## 2017-09-22 DIAGNOSIS — I10 Essential (primary) hypertension: Secondary | ICD-10-CM

## 2017-10-25 ENCOUNTER — Other Ambulatory Visit: Payer: Self-pay | Admitting: Family

## 2017-10-25 DIAGNOSIS — E1140 Type 2 diabetes mellitus with diabetic neuropathy, unspecified: Secondary | ICD-10-CM

## 2017-11-03 ENCOUNTER — Ambulatory Visit: Payer: Self-pay | Admitting: Family

## 2017-11-06 ENCOUNTER — Encounter: Payer: Self-pay | Admitting: Family

## 2017-11-06 ENCOUNTER — Other Ambulatory Visit: Payer: Self-pay | Admitting: Family

## 2017-11-06 ENCOUNTER — Ambulatory Visit (INDEPENDENT_AMBULATORY_CARE_PROVIDER_SITE_OTHER): Payer: Medicare Other | Admitting: Family

## 2017-11-06 VITALS — BP 122/84 | HR 72 | Temp 98.5°F | Resp 16 | Wt 245.4 lb

## 2017-11-06 DIAGNOSIS — E1140 Type 2 diabetes mellitus with diabetic neuropathy, unspecified: Secondary | ICD-10-CM

## 2017-11-06 DIAGNOSIS — Z1211 Encounter for screening for malignant neoplasm of colon: Secondary | ICD-10-CM | POA: Diagnosis not present

## 2017-11-06 DIAGNOSIS — E039 Hypothyroidism, unspecified: Secondary | ICD-10-CM

## 2017-11-06 DIAGNOSIS — I10 Essential (primary) hypertension: Secondary | ICD-10-CM

## 2017-11-06 LAB — BASIC METABOLIC PANEL
BUN: 15 mg/dL (ref 6–23)
CO2: 26 mEq/L (ref 19–32)
Calcium: 9.6 mg/dL (ref 8.4–10.5)
Chloride: 102 mEq/L (ref 96–112)
Creatinine, Ser: 0.75 mg/dL (ref 0.40–1.20)
GFR: 97.67 mL/min (ref >60.00)
GLUCOSE: 128 mg/dL — AB (ref 70–99)
Potassium: 4 mEq/L (ref 3.5–5.1)
SODIUM: 138 meq/L (ref 135–145)

## 2017-11-06 LAB — HEMOGLOBIN A1C: Hgb A1c MFr Bld: 6.6 % — ABNORMAL HIGH (ref 4.6–6.5)

## 2017-11-06 NOTE — Patient Instructions (Signed)
Colonoscopy  Mammogram this summer  Labs   Pleasure seeing you

## 2017-11-06 NOTE — Assessment & Plan Note (Signed)
Continue current medication. Will check TSH at followup

## 2017-11-06 NOTE — Progress Notes (Signed)
Subjective:    Patient ID: Cynthia [name], female    DOB: 1946/03/08, 72 y.o.   MRN: [id number]  CC: [name] is a 72 y.o. female who presents today for follow up.   HPI: Doing well today. No complaints.   DM- FBG averages 105. Doing well on metformin.   HTN- Compliant with medication. Follows with [name]. Denies exertional chest pain or pressure, numbness or tingling radiating to left arm or jaw, palpitations, dizziness, frequent headaches, changes in vision, or shortness of breath.   H/o thyroidectomy. No cold/ heat intolerance. Compliant with medication   Colonoscopy 04/2017 [name]; due for repeat Mammogram UTD UTD vaccines No longer does CT lung screen , had followed with [name].   HISTORY:  Past Medical History:  Diagnosis Date  . Arthritis   . Coronary artery disease   . Diabetes mellitus without complication ([place])   . Hyperlipidemia   . Hypertension   . Lung nodule   . Thyroid disease    Past Surgical History:  Procedure Laterality Date  . BREAST BIOPSY Left 11/25/2015   stereo  neg  . BREAST EXCISIONAL BIOPSY Left yrs ago   benign  . COLONOSCOPY WITH PROPOFOL N/A 04/20/2017   Procedure: COLONOSCOPY WITH PROPOFOL;  Surgeon: [name], MD;  [facility]COPY;  Service: Endoscopy;  Laterality: N/A;  . THYROIDECTOMY     Dr. [name]  . VAGINAL DELIVERY     4   Family History  Problem Relation Age of Onset  . Hypertension Mother   . Hypertension Father   . Diabetes Sister   . Thyroid disease Sister   . Breast cancer Neg Hx     Allergies: Patient has no known allergies. Current Outpatient Medications on File Prior to Visit  Medication Sig Dispense Refill  . amLODipine (NORVASC) 10 MG tablet TAKE 1 TABLET (10 MG TOTAL) BY MOUTH DAILY. 90 tablet 3  . aspirin 81 MG tablet Take 81 mg by mouth daily.    Marland Kitchen atorvastatin (LIPITOR) 20 MG tablet TAKE 1 TABLET (20 MG TOTAL) BY MOUTH DAILY. 90 tablet 1  . BOOSTRIX 5-2.5-18.5 injection     .  Calcium Carbonate-Vitamin D 600-400 MG-UNIT per tablet Take 1 tablet by mouth daily.    Marland Kitchen gabapentin (NEURONTIN) 100 MG capsule TAKE 1 CAPSULE BY MOUTH THREE TIMES A DAY 90 capsule 0  . glucose blood (ONE TOUCH ULTRA TEST) test strip TEST BLOOD SUGAR 1-2 TIMES A DAY 100 each 4  . levothyroxine (SYNTHROID, LEVOTHROID) 137 MCG tablet Take 1 tablet (137 mcg total) by mouth daily before breakfast. 90 tablet 1  . levothyroxine (SYNTHROID, LEVOTHROID) 137 MCG tablet TAKE 1 TABLET (137 MCG TOTAL) BY MOUTH DAILY BEFORE BREAKFAST. 90 tablet 1  . losartan-hydrochlorothiazide (HYZAAR) 50-12.5 MG tablet TAKE 1 TABLET BY MOUTH EVERY DAY 90 tablet 1  . meloxicam (MOBIC) 15 MG tablet Take 1 tablet (15 mg total) by mouth daily. 90 tablet 1  . metFORMIN (GLUCOPHAGE) 500 MG tablet TAKE 1 TABLET (500 MG TOTAL) BY MOUTH 2 (TWO) TIMES DAILY WITH A MEAL. 180 tablet 1  . metoprolol tartrate (LOPRESSOR) 25 MG tablet Take 1 tablet (25 mg total) by mouth 2 (two) times daily. 90 tablet 1  . omeprazole (PRILOSEC) 20 MG capsule Take 1 capsule (20 mg total) by mouth daily. 90 capsule 1  . ONETOUCH DELICA LANCETS FINE MISC CHECK BLOOD GLUCOSE TWICE A DAY 100 each 3   No current facility-administered medications on file prior to  visit.     Social History   Tobacco Use  . Smoking status: Former Smoker    Years: 20.00    Types: Cigarettes    Last attempt to quit: 11/17/2012    Years since quitting: 4.9  . Smokeless tobacco: Former Network engineer Use Topics  . Alcohol use: No  . Drug use: No    Review of Systems  Constitutional: Negative for chills and fever.  HENT: Negative for trouble swallowing.   Respiratory: Negative for cough.   Cardiovascular: Negative for chest pain and palpitations.  Gastrointestinal: Negative for nausea and vomiting.  Endocrine: Negative for cold intolerance and heat intolerance.      Objective:    BP 122/84 (BP Location: Left Arm, Patient Position: Sitting, Cuff Size: Normal)   Pulse  72   Temp 98.5 F (36.9 C) (Oral)   Resp 16   Wt 245 lb 6 oz (111.3 kg)   SpO2 96%   BMI 37.31 kg/m  BP Readings from Last 3 Encounters:  11/06/17 122/84  07/28/17 110/66  07/19/17 112/70   Wt Readings from Last 3 Encounters:  11/06/17 245 lb 6 oz (111.3 kg)  07/28/17 248 lb 12.8 oz (112.9 kg)  07/18/17 247 lb 6.4 oz (112.2 kg)    Physical Exam  Constitutional: She appears well-developed and well-nourished.  Eyes: Conjunctivae are normal.  Neck:  Unable to palpate thyroid tissue. No masses noted on neck  Cardiovascular: Normal rate, regular rhythm, normal heart sounds and normal pulses.  Pulmonary/Chest: Effort normal and breath sounds normal. She has no wheezes. She has no rhonchi. She has no rales.  Neurological: She is alert.  Skin: Skin is warm and dry.  Psychiatric: She has a normal mood and affect. Her speech is normal and behavior is normal. Thought content normal.  Vitals reviewed.      Assessment & Plan:   Problem List Items Addressed This Visit      Cardiovascular and Mediastinum   Hypertension - Primary    At goal. Will continue medications. Pending BMP      Relevant Orders   Basic metabolic panel     Endocrine   Type 2 diabetes mellitus with diabetic neuropathy, unspecified (HCC)    Continue regimen. Pending a1c      Relevant Orders   Hemoglobin A1c   Hypothyroidism    Continue current medication. Will check TSH at followup        Other   Screen for colon cancer   Relevant Orders   Ambulatory referral to Gastroenterology       I am having [name] maintain her Calcium Carbonate-Vitamin D, aspirin, BOOSTRIX, amLODipine, metoprolol tartrate, ONETOUCH DELICA LANCETS FINE, omeprazole, metFORMIN, levothyroxine, atorvastatin, levothyroxine, meloxicam, gabapentin, losartan-hydrochlorothiazide, and glucose blood.   No orders of the defined types were placed in this encounter.   Return precautions given.   Risks, benefits, and  alternatives of the medications and treatment plan prescribed today were discussed, and patient expressed understanding.   Education regarding symptom management and diagnosis given to patient on AVS.  Continue to follow with Burnard Hawthorne, FNP for routine health maintenance.   Cynthia [name] and I agreed with plan.   Mable Paris, FNP

## 2017-11-06 NOTE — Assessment & Plan Note (Signed)
Continue regimen. Pending a1c

## 2017-11-06 NOTE — Assessment & Plan Note (Signed)
At goal. Will continue medications. Pending BMP

## 2017-12-04 ENCOUNTER — Other Ambulatory Visit: Payer: Self-pay | Admitting: Family

## 2017-12-04 ENCOUNTER — Telehealth: Payer: Self-pay | Admitting: Family

## 2017-12-04 DIAGNOSIS — M1711 Unilateral primary osteoarthritis, right knee: Secondary | ICD-10-CM

## 2017-12-04 NOTE — Telephone Encounter (Signed)
Noted Omeprazole has been refilled per Mable Paris, NP

## 2017-12-04 NOTE — Telephone Encounter (Signed)
Copied from [place]. Topic: Quick Communication - Rx Refill/Question >> Dec 04, 2017  9:01 AM Burnis Medin, NT wrote: Medication: omeprazole (PRILOSEC) 20 MG capsule   Has the patient contacted their pharmacy? Yes   (Agent: If no, request that the patient contact the pharmacy for the refill.)   Preferred Pharmacy (with phone number or street name): [id number] [place] [facility] [address] [phone] (Phone) [phone] (Fax)     Agent: Please be advised that RX refills may take up to 3 business days. We ask that you follow-up with your pharmacy.

## 2017-12-05 ENCOUNTER — Telehealth: Payer: Self-pay | Admitting: Gastroenterology

## 2017-12-05 NOTE — Telephone Encounter (Signed)
Patient LVM that she is ready to schedule her colonoscopy.

## 2017-12-06 ENCOUNTER — Telehealth: Payer: Self-pay | Admitting: Family

## 2017-12-06 NOTE — Telephone Encounter (Signed)
Copied from [place]. Topic: Quick Communication - See Telephone Encounter >> Dec 06, 2017 10:11 AM Bea Graff, NT wrote: CRM for notification. See Telephone encounter for: Pt would like a call to discuss her colonoscopy appt. She is having trouble getting in touch with the place to get it scheduled.   12/06/17.

## 2017-12-06 NOTE — Telephone Encounter (Signed)
I tried calling patient back. No answer. They just tried calling her yesterday and her referral has been electronically sent to them. They will reach back out to her to get this scheduled.

## 2017-12-06 NOTE — Telephone Encounter (Signed)
I dont see where there is an appointment for a colonoscopy but  I do see the referral. Since Cynthia [name] is not here I'm not sure who to rout to. thanks

## 2017-12-07 ENCOUNTER — Encounter: Payer: Self-pay | Admitting: Emergency Medicine

## 2017-12-07 ENCOUNTER — Other Ambulatory Visit: Payer: Self-pay

## 2017-12-07 ENCOUNTER — Emergency Department
Admission: EM | Admit: 2017-12-07 | Discharge: 2017-12-07 | Disposition: A | Discharge status: Home / Self Care | Payer: Medicare Other | Attending: Emergency Medicine | Admitting: Emergency Medicine

## 2017-12-07 DIAGNOSIS — M5137 Other intervertebral disc degeneration, lumbosacral region: Secondary | ICD-10-CM | POA: Insufficient documentation

## 2017-12-07 DIAGNOSIS — Z79899 Other long term (current) drug therapy: Secondary | ICD-10-CM | POA: Insufficient documentation

## 2017-12-07 DIAGNOSIS — M160 Bilateral primary osteoarthritis of hip: Secondary | ICD-10-CM | POA: Diagnosis not present

## 2017-12-07 DIAGNOSIS — E119 Type 2 diabetes mellitus without complications: Secondary | ICD-10-CM | POA: Diagnosis not present

## 2017-12-07 DIAGNOSIS — Z87891 Personal history of nicotine dependence: Secondary | ICD-10-CM | POA: Insufficient documentation

## 2017-12-07 DIAGNOSIS — M25552 Pain in left hip: Secondary | ICD-10-CM | POA: Insufficient documentation

## 2017-12-07 DIAGNOSIS — M545 Low back pain, unspecified: Secondary | ICD-10-CM

## 2017-12-07 DIAGNOSIS — E1140 Type 2 diabetes mellitus with diabetic neuropathy, unspecified: Secondary | ICD-10-CM | POA: Insufficient documentation

## 2017-12-07 DIAGNOSIS — I2510 Atherosclerotic heart disease of native coronary artery without angina pectoris: Secondary | ICD-10-CM | POA: Diagnosis not present

## 2017-12-07 DIAGNOSIS — I10 Essential (primary) hypertension: Secondary | ICD-10-CM | POA: Diagnosis not present

## 2017-12-07 DIAGNOSIS — Z7984 Long term (current) use of oral hypoglycemic drugs: Secondary | ICD-10-CM | POA: Insufficient documentation

## 2017-12-07 DIAGNOSIS — E039 Hypothyroidism, unspecified: Secondary | ICD-10-CM | POA: Insufficient documentation

## 2017-12-07 DIAGNOSIS — Z7982 Long term (current) use of aspirin: Secondary | ICD-10-CM | POA: Diagnosis not present

## 2017-12-07 MED ORDER — CYCLOBENZAPRINE HCL 5 MG PO TABS: 5.0000 mg | ORAL_TABLET | Freq: Every day | ORAL | 0 refills | Status: DC

## 2017-12-07 MED ORDER — DICLOFENAC SODIUM 25 MG PO TBEC
50.0000 mg | DELAYED_RELEASE_TABLET | Freq: Once | ORAL | Status: AC
Start: 2017-12-07 — End: 2017-12-07
  Administered 2017-12-07: 50 mg via ORAL
  Filled 2017-12-07: qty 1

## 2017-12-07 MED ORDER — DICLOFENAC SODIUM 50 MG PO TBEC: 50.0000 mg | DELAYED_RELEASE_TABLET | Freq: Two times a day (BID) | ORAL | 0 refills | Status: AC

## 2017-12-07 NOTE — ED Notes (Signed)
Pt verbalizes understanding of d/c instructions, medications and follow up 

## 2017-12-07 NOTE — ED Triage Notes (Signed)
Pt states worsening pain to left hip over past few days, heating pad and meds not working at this time, denies fall or injury to area.

## 2017-12-07 NOTE — ED Notes (Signed)
Pt c/o  Left lower back pain that radiates into hip - pt denies difficulty with urination - pt denies injury to back

## 2017-12-07 NOTE — Discharge Instructions (Signed)
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 Take the prescription anti-inflammatory as directed. Hold your Meloxicam while dosing this medicine. You may take the muscle relaxant, at bedtime, as needed. Follow-up with your provider or return to the ED as needed.

## 2017-12-07 NOTE — ED Provider Notes (Signed)
[facility] Emergency Department Provider Note ____________________________________________  Time seen: 1010  I have reviewed the triage vital signs and the nursing notes.  HISTORY  Chief Complaint  Back Pain  HPI Cynthia [name] is a 72 y.o. female presents to the ED for evaluation of worsening left hip pain over the last few days.  Patient denies any recent injury, accident, or trauma.  She has been evaluated in the past for complaints of hip and low back pain.  Patient denies any current medical intervention treatment.  She is on gabapentin for her diabetic neuropathy.  She denies any distal paresthesias, footdrop, or incontinence.  She also takes meloxicam daily at 7.5 mg for her arthritis.  Past Medical History:  Diagnosis Date  . Arthritis   . Coronary artery disease   . Diabetes mellitus without complication ([place])   . Hyperlipidemia   . Hypertension   . Lung nodule   . Thyroid disease     Patient Active Problem List   Diagnosis Date Noted  . Acute pain of left foot 07/30/2017  . Screening for breast cancer 02/15/2017  . Screen for colon cancer 02/15/2017  . Routine general medical examination at a health care facility 10/21/2015  . Medicare annual wellness visit, subsequent 03/08/2013  . Abnormal CT scan, chest 11/28/2012  . Hypertension 10/18/2012  . Arthritis 10/18/2012  . Type 2 diabetes mellitus with diabetic neuropathy, unspecified ([place]) 10/18/2012  . Hypothyroidism 10/18/2012  . Hyperlipidemia 10/18/2012    Past Surgical History:  Procedure Laterality Date  . BREAST BIOPSY Left 11/25/2015   stereo  neg  . BREAST EXCISIONAL BIOPSY Left yrs ago   benign  . COLONOSCOPY WITH PROPOFOL N/A 04/20/2017   Procedure: COLONOSCOPY WITH PROPOFOL;  Surgeon: [name], MD;  Location: [facility] ENDOSCOPY;  Service: Endoscopy;  Laterality: N/A;  . THYROIDECTOMY     Dr. [name]  . VAGINAL DELIVERY     4    Prior to Admission medications   Medication  Sig Start Date End Date Taking? Authorizing Provider  amLODipine (NORVASC) 10 MG tablet TAKE 1 TABLET (10 MG TOTAL) BY MOUTH DAILY. 02/15/17   Burnard Hawthorne, FNP  aspirin 81 MG tablet Take 81 mg by mouth daily.    [provider]  atorvastatin (LIPITOR) 20 MG tablet TAKE 1 TABLET (20 MG TOTAL) BY MOUTH DAILY. 07/10/17   Burnard Hawthorne, FNP  BOOSTRIX 5-2.5-18.5 injection  05/17/16   [provider]  Calcium Carbonate-Vitamin D 600-400 MG-UNIT per tablet Take 1 tablet by mouth daily.    [provider]  cyclobenzaprine (FLEXERIL) 5 MG tablet Take 1 tablet (5 mg total) by mouth at bedtime. 12/07/17   Harmoney Sienkiewicz, Dannielle Karvonen, PA-C  diclofenac (VOLTAREN) 50 MG EC tablet Take 1 tablet (50 mg total) by mouth 2 (two) times daily for 7 days. 12/07/17 12/14/17  Lan Mcneill, Dannielle Karvonen, PA-C  gabapentin (NEURONTIN) 100 MG capsule TAKE 1 CAPSULE BY MOUTH THREE TIMES A DAY 08/23/17   Burnard Hawthorne, FNP  gabapentin (NEURONTIN) 100 MG capsule TAKE ONE CAPSULE BY MOUTH 3 TIMES A DAY 11/07/17   Burnard Hawthorne, FNP  glucose blood (ONE TOUCH ULTRA TEST) test strip TEST BLOOD SUGAR 1-2 TIMES A DAY 10/25/17   Burnard Hawthorne, FNP  levothyroxine (SYNTHROID, LEVOTHROID) 137 MCG tablet Take 1 tablet (137 mcg total) by mouth daily before breakfast. 07/07/17   Burnard Hawthorne, FNP  levothyroxine (SYNTHROID, LEVOTHROID) 137 MCG tablet TAKE 1 TABLET (137  MCG TOTAL) BY MOUTH DAILY BEFORE BREAKFAST. 07/10/17   Burnard Hawthorne, FNP  losartan-hydrochlorothiazide (HYZAAR) 50-12.5 MG tablet TAKE 1 TABLET BY MOUTH EVERY DAY 09/22/17   Burnard Hawthorne, FNP  meloxicam (MOBIC) 15 MG tablet Take 1 tablet (15 mg total) by mouth daily. 07/18/17   Burnard Hawthorne, FNP  metFORMIN (GLUCOPHAGE) 500 MG tablet TAKE 1 TABLET (500 MG TOTAL) BY MOUTH 2 (TWO) TIMES DAILY WITH A MEAL. 07/07/17   Burnard Hawthorne, FNP  metoprolol tartrate (LOPRESSOR) 25 MG tablet Take 1 tablet (25 mg total) by mouth 2  (two) times daily. 02/15/17   Burnard Hawthorne, FNP  omeprazole (PRILOSEC) 20 MG capsule TAKE 1 CAPSULE BY MOUTH EVERY DAY 12/04/17   Arnett, Yvetta Coder, FNP  [facility] DELICA LANCETS FINE MISC CHECK BLOOD GLUCOSE TWICE A DAY 02/15/17   Burnard Hawthorne, FNP    Allergies Patient has no known allergies.  Family History  Problem Relation Age of Onset  . Hypertension Mother   . Hypertension Father   . Diabetes Sister   . Thyroid disease Sister   . Breast cancer Neg Hx     Social History Social History   Tobacco Use  . Smoking status: Former Smoker    Years: 20.00    Types: Cigarettes    Last attempt to quit: 11/17/2012    Years since quitting: 5.0  . Smokeless tobacco: Former Network engineer Use Topics  . Alcohol use: No  . Drug use: No    Review of Systems  Constitutional: Negative for fever. Cardiovascular: Negative for chest pain. Respiratory: Negative for shortness of breath. Gastrointestinal: Negative for abdominal pain, vomiting and diarrhea. Genitourinary: Negative for dysuria. Musculoskeletal: Negative for back pain.  Hip pain as above. Skin: Negative for rash. Neurological: Negative for headaches, focal weakness or numbness. ____________________________________________  PHYSICAL EXAM:  VITAL SIGNS: ED Triage Vitals  Enc Vitals Group     BP 12/07/17 0912 131/77     Pulse Rate 12/07/17 0909 87     Resp 12/07/17 0909 18     Temp 12/07/17 0909 98 F (36.7 C)     Temp Source 12/07/17 0909 Oral     SpO2 12/07/17 0909 97 %     Weight 12/07/17 0910 245 lb (111.1 kg)     Height 12/07/17 0910 5\' 8"  (1.727 m)     Head Circumference --      Peak Flow --      Pain Score 12/07/17 0909 10     Pain Loc --      Pain Edu? --      Excl. in [place]? --     Constitutional: Alert and oriented. Well appearing and in no distress. Head: Normocephalic and atraumatic. Cardiovascular: Normal rate, regular rhythm. Normal distal pulses. Respiratory: Normal respiratory effort. No  wheezes/rales/rhonchi. Gastrointestinal: Soft and nontender. No distention. Musculoskeletal: No spinal alignment without midline tenderness, spasm, deformity, or step-off.  Patient with full active range of motion of the left hip.  She is able to demonstrate hip internal and external rotation without crepitus.  Nontender with normal range of motion in all extremities.  Neurologic: Cranial nerves II through XII grossly intact. Normal LE DTRs bilaterally.  Normal toe dorsiflexion and foot eversion noted.  Normal gait without ataxia. Normal speech and language. No gross focal neurologic deficits are appreciated. ____________________________________________  PROCEDURES  Procedures EC Voltaren 50 mg PO ____________________________________________  INITIAL IMPRESSION / ASSESSMENT AND PLAN / ED COURSE  Patient with ED  evaluation of acute on chronic left hip pain.  Patient is also with some mild low back pain.  Patient with a history of degenerative joint disease of the hip as well as some DDD of the lumbar sacral spine report some improvement after oral medication administration.  Plan at this time is to hold the meloxicam and do a short course of Voltaren twice daily.  The patient may discuss with her primary provider the option of transitioning to diclofenac if she prefers.  Otherwise she is encouraged to restart her meloxicam at 15 mg/day.  She will continue with gabapentin as previously prescribed.  A prescription for Flexeril is also provided that the patient may dose at bedtime as needed for pain relief.  Return precautions have been reviewed. ____________________________________________  FINAL CLINICAL IMPRESSION(S) / ED DIAGNOSES  Final diagnoses:  Acute left-sided low back pain without sciatica  Pain of left hip joint  DDD (degenerative disc disease), lumbosacral  Primary osteoarthritis of both hips      Melvenia Needles, PA-C 12/07/17 1753    Carrie Mew, MD 12/08/17  [phone]

## 2017-12-07 NOTE — ED Notes (Signed)
email sent to pharmacy to send diclofenac

## 2017-12-11 ENCOUNTER — Telehealth: Payer: Self-pay | Admitting: Gastroenterology

## 2017-12-11 NOTE — Telephone Encounter (Signed)
Gastroenterology Pre-Procedure Review  Request Date:   Requesting Physician: Dr.    PATIENT REVIEW QUESTIONS: The patient responded to the following health history questions as indicated:    1. Are you having any GI issues? no 2. Do you have a personal history of Polyps? yes (yes) 3. Do you have a family history of Colon Cancer or Polyps? no 4. Diabetes Mellitus? yes (Type II) 5. Joint replacements in the past 12 months?no 6. Major health problems in the past 3 months?no 7. Any artificial heart valves, MVP, or defibrillator?no    MEDICATIONS & ALLERGIES:    Patient reports the following regarding taking any anticoagulation/antiplatelet therapy:   Plavix, Coumadin, Eliquis, Xarelto, Lovenox, Pradaxa, Brilinta, or Effient? no Aspirin? yes (ASA 81 mg)  Patient confirms/reports the following medications:  Current Outpatient Medications  Medication Sig Dispense Refill   amLODipine (NORVASC) 10 MG tablet TAKE 1 TABLET (10 MG TOTAL) BY MOUTH DAILY. 90 tablet 3   aspirin 81 MG tablet Take 81 mg by mouth daily.     atorvastatin (LIPITOR) 20 MG tablet TAKE 1 TABLET (20 MG TOTAL) BY MOUTH DAILY. 90 tablet 1   BOOSTRIX 5-2.5-18.5 injection      Calcium Carbonate-Vitamin D 600-400 MG-UNIT per tablet Take 1 tablet by mouth daily.     cyclobenzaprine (FLEXERIL) 5 MG tablet Take 1 tablet (5 mg total) by mouth at bedtime. 15 tablet 0   diclofenac (VOLTAREN) 50 MG EC tablet Take 1 tablet (50 mg total) by mouth 2 (two) times daily for 7 days. 14 tablet 0   gabapentin (NEURONTIN) 100 MG capsule TAKE 1 CAPSULE BY MOUTH THREE TIMES A DAY 90 capsule 0   gabapentin (NEURONTIN) 100 MG capsule TAKE ONE CAPSULE BY MOUTH 3 TIMES A DAY 90 capsule 1   glucose blood (ONE TOUCH ULTRA TEST) test strip TEST BLOOD SUGAR 1-2 TIMES A DAY 100 each 4   levothyroxine (SYNTHROID, LEVOTHROID) 137 MCG tablet Take 1 tablet (137 mcg total) by mouth daily before breakfast. 90 tablet 1   levothyroxine (SYNTHROID,  LEVOTHROID) 137 MCG tablet TAKE 1 TABLET (137 MCG TOTAL) BY MOUTH DAILY BEFORE BREAKFAST. 90 tablet 1   losartan-hydrochlorothiazide (HYZAAR) 50-12.5 MG tablet TAKE 1 TABLET BY MOUTH EVERY DAY 90 tablet 1   meloxicam (MOBIC) 15 MG tablet Take 1 tablet (15 mg total) by mouth daily. 90 tablet 1   metFORMIN (GLUCOPHAGE) 500 MG tablet TAKE 1 TABLET (500 MG TOTAL) BY MOUTH 2 (TWO) TIMES DAILY WITH A MEAL. 180 tablet 1   metoprolol tartrate (LOPRESSOR) 25 MG tablet Take 1 tablet (25 mg total) by mouth 2 (two) times daily. 90 tablet 1   omeprazole (PRILOSEC) 20 MG capsule TAKE 1 CAPSULE BY MOUTH EVERY DAY 90 capsule 1   ONETOUCH DELICA LANCETS FINE MISC CHECK BLOOD GLUCOSE TWICE A DAY 100 each 3   No current facility-administered medications for this visit.     Patient confirms/reports the following allergies:  No Known Allergies  No orders of the defined types were placed in this encounter.   AUTHORIZATION INFORMATION Primary Insurance: 1D#: Group #:  Secondary Insurance: 1D#: Group #:  SCHEDULE INFORMATION: Date:  Time: Location:

## 2017-12-13 ENCOUNTER — Other Ambulatory Visit: Payer: Self-pay

## 2017-12-13 DIAGNOSIS — D369 Benign neoplasm, unspecified site: Secondary | ICD-10-CM

## 2017-12-13 MED ORDER — PEG 3350-KCL-NABCB-NACL-NASULF 236 G PO SOLR: 4000.0000 mL | Freq: Once | ORAL | 0 refills | Status: AC

## 2017-12-21 ENCOUNTER — Telehealth: Payer: Self-pay | Admitting: Gastroenterology

## 2017-12-21 NOTE — Telephone Encounter (Signed)
Pt is calling to find out which one of her rx she is supposed to stop for her procedure please call pt

## 2017-12-21 NOTE — Telephone Encounter (Signed)
Pt is calling again to speak to nurse in regards to what rx she has to stop taking before procedure

## 2017-12-21 NOTE — Telephone Encounter (Signed)
LVM advising patient about medications.  Meds to be taken until the day before the procedure. Then after the procedure.

## 2017-12-24 ENCOUNTER — Other Ambulatory Visit: Payer: Self-pay | Admitting: Family

## 2017-12-28 ENCOUNTER — Ambulatory Visit (INDEPENDENT_AMBULATORY_CARE_PROVIDER_SITE_OTHER): Payer: Medicare Other | Admitting: Family Medicine

## 2017-12-28 ENCOUNTER — Encounter: Payer: Self-pay | Admitting: Family Medicine

## 2017-12-28 VITALS — BP 140/90 | HR 67 | Temp 98.2°F | Resp 16 | Wt 248.2 lb

## 2017-12-28 DIAGNOSIS — K1379 Other lesions of oral mucosa: Secondary | ICD-10-CM | POA: Diagnosis not present

## 2017-12-28 DIAGNOSIS — Z972 Presence of dental prosthetic device (complete) (partial): Secondary | ICD-10-CM | POA: Diagnosis not present

## 2017-12-28 DIAGNOSIS — K08109 Complete loss of teeth, unspecified cause, unspecified class: Secondary | ICD-10-CM

## 2017-12-28 MED ORDER — MAGIC MOUTHWASH: 5.0000 mL | Freq: Three times a day (TID) | ORAL | 0 refills | Status: DC | PRN

## 2017-12-28 NOTE — Progress Notes (Signed)
Subjective:    Patient ID: Cynthia [name], female    DOB: [birth year], 72 y.o.   MRN: [id number] es  HPI  Cynthia [name] is a 72 year old female who presents today with a mouth sore that has been present for 2 weeks that has intermittently painful and has not fully improved. She describes discomfort that occurs with eating "certain foods."  Aggravating factors: eating food She denies fever, chills, sweats, sore throat, or URI symptoms. Treatment: Rinsing with peroxide BID for 2 weeks has provided no benefit.  She wears full dentures and has not seen by a dentist for greater than 3 or 4 years. History of HTN, T2DM, Hypothyroidism, and arthritis Reports feeling well overall with exception of mouth sore.  Review of Systems  Constitutional: Negative for chills, fatigue and fever.  HENT: Positive for mouth sores. Negative for congestion, postnasal drip, rhinorrhea, sneezing and sore throat.   Respiratory: Negative for cough, shortness of breath and wheezing.   Cardiovascular: Negative for chest pain.  Gastrointestinal: Negative for abdominal pain, diarrhea and vomiting.  Skin: Negative for rash.   Past Medical History:  Diagnosis Date  . Arthritis   . Coronary artery disease   . Diabetes mellitus without complication ([place])   . Hyperlipidemia   . Hypertension   . Lung nodule   . Thyroid disease      Social History   Socioeconomic History  . Marital status: Married    Spouse name: Not on file  . Number of children: Not on file  . Years of education: Not on file  . Highest education level: Not on file  Occupational History  . Not on file  Social Needs  . Financial resource strain: Not on file  . Food insecurity:    Worry: Not on file    Inability: Not on file  . Transportation needs:    Medical: Not on file    Non-medical: Not on file  Tobacco Use  . Smoking status: Former Smoker    Years: 20.00    Types: Cigarettes    Last attempt to quit: 11/17/2012    Years since  quitting: 5.1  . Smokeless tobacco: Former Network engineer and Sexual Activity  . Alcohol use: No  . Drug use: No  . Sexual activity: Never  Lifestyle  . Physical activity:    Days per week: Not on file    Minutes per session: Not on file  . Stress: Not on file  Relationships  . Social connections:    Talks on phone: Not on file    Gets together: Not on file    Attends religious service: Not on file    Active member of club or organization: Not on file    Attends meetings of clubs or organizations: Not on file    Relationship status: Not on file  . Intimate partner violence:    Fear of current or ex partner: Not on file    Emotionally abused: Not on file    Physically abused: Not on file    Forced sexual activity: Not on file  Other Topics Concern  . Not on file  Social History Narrative   Lives in [place] with husband. Has 4 children.      4 grandchildren.      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 BREAST BIOPSY Left 11/25/2015   stereo  neg  . BREAST EXCISIONAL BIOPSY Left yrs ago   benign  . COLONOSCOPY WITH PROPOFOL N/A 04/20/2017   Procedure: COLONOSCOPY WITH PROPOFOL;  Surgeon: [name], MD;  Location: [facility] ENDOSCOPY;  Service: Endoscopy;  Laterality: N/A;  . THYROIDECTOMY     Dr. [name]  . VAGINAL DELIVERY     4    Family History  Problem Relation Age of Onset  . Hypertension Mother   . Hypertension Father   . Diabetes Sister   . Thyroid disease Sister   . Breast cancer Neg Hx     No Known Allergies  Current Outpatient Medications on File Prior to Visit  Medication Sig Dispense Refill  . amLODipine (NORVASC) 10 MG tablet TAKE 1 TABLET (10 MG TOTAL) BY MOUTH DAILY. 90 tablet 3  . aspirin 81 MG tablet Take 81 mg by mouth daily.    Marland Kitchen atorvastatin (LIPITOR) 20 MG tablet TAKE 1 TABLET (20 MG TOTAL) BY MOUTH DAILY. 90 tablet 1    . BOOSTRIX 5-2.5-18.5 injection     . Calcium Carbonate-Vitamin D 600-400 MG-UNIT per tablet Take 1 tablet by mouth daily.    . cyclobenzaprine (FLEXERIL) 5 MG tablet Take 1 tablet (5 mg total) by mouth at bedtime. 15 tablet 0  . gabapentin (NEURONTIN) 100 MG capsule TAKE 1 CAPSULE BY MOUTH THREE TIMES A DAY 90 capsule 0  . gabapentin (NEURONTIN) 100 MG capsule TAKE ONE CAPSULE BY MOUTH 3 TIMES A DAY 90 capsule 1  . glucose blood (ONE TOUCH ULTRA TEST) test strip TEST BLOOD SUGAR 1-2 TIMES A DAY 100 each 4  . levothyroxine (SYNTHROID, LEVOTHROID) 137 MCG tablet Take 1 tablet (137 mcg total) by mouth daily before breakfast. 90 tablet 1  . levothyroxine (SYNTHROID, LEVOTHROID) 137 MCG tablet TAKE 1 TABLET (137 MCG TOTAL) BY MOUTH DAILY BEFORE BREAKFAST. 90 tablet 1  . losartan-hydrochlorothiazide (HYZAAR) 50-12.5 MG tablet TAKE 1 TABLET BY MOUTH EVERY DAY 90 tablet 1  . meloxicam (MOBIC) 15 MG tablet Take 1 tablet (15 mg total) by mouth daily. 90 tablet 1  . metFORMIN (GLUCOPHAGE) 500 MG tablet TAKE 1 TABLET (500 MG TOTAL) BY MOUTH 2 (TWO) TIMES DAILY WITH A MEAL. 180 tablet 1  . metoprolol tartrate (LOPRESSOR) 25 MG tablet Take 1 tablet (25 mg total) by mouth 2 (two) times daily. 90 tablet 1  . omeprazole (PRILOSEC) 20 MG capsule TAKE 1 CAPSULE BY MOUTH EVERY DAY 90 capsule 1  . ONETOUCH DELICA LANCETS FINE MISC CHECK BLOOD GLUCOSE TWICE A DAY 100 each 3   No current facility-administered medications on file prior to visit.     BP 140/90 (BP Location: Left Arm, Patient Position: Sitting, Cuff Size: Normal)   Pulse 67   Temp 98.2 F (36.8 C) (Oral)   Resp 16   Wt 248 lb 4 oz (112.6 kg)   SpO2 98%   BMI 37.75 kg/m        Objective:   Physical Exam  Constitutional: She is oriented to person, place, and time. She appears well-developed and well-nourished.  HENT:  Right Ear: Tympanic membrane normal.  Left Ear: Tympanic membrane normal.  Nose: No rhinorrhea.  Mouth/Throat:  Oropharynx is clear and moist. She has dentures. Oral lesions present.  Right buccal mucosa mildly erythematous with small healing apthous ulcer noted at the gumline where lower denture rests  Eyes: Pupils are equal, round, and reactive to light. No scleral icterus.  Neck: Neck supple.  Cardiovascular: Normal rate and regular rhythm.  Pulmonary/Chest: Effort normal and breath sounds normal. She has no wheezes. She has no rales.  Lymphadenopathy:    She has no cervical adenopathy.  Neurological: She is alert and oriented to person, place, and time.  Skin: Skin is warm and dry. No rash noted.  Psychiatric: She has a normal mood and affect. Her behavior is normal. Judgment and thought content normal.      Assessment & Plan:  1. Mouth sore Irritation of buccal mucosa that is close to lower denture on right side which appear to be related to denture rubbing her mucosa. Small apthous ulcer that is resolving also noted. Advised avoidance of peroxide and will try magic mouthwash for discomfort. Further advised that she see a dentist for oral evaluation and fit of dentures. We discussed that it is recommended to see a dentist for oral cancer screenings even if natural teeth are no longer present. We further discussed that bone loss occurs with dentures and absence of natural teeth and this can influence fit of denture and be a source of gum irritation. She voiced understanding and agreed with plan. - magic mouthwash SOLN; Take 5 mLs by mouth 3 (three) times daily as needed for mouth pain.  Dispense: 120 mL; Refill: 0   2. Full dentures Dentures are present and mucosal irritation is close to area where dentures are seated on lower arch. Advised follow up with dentist for evaluation of fit.  Delano Metz, FNP-C

## 2017-12-28 NOTE — Patient Instructions (Addendum)
Please stop peroxide mouthwash and switch to prescription mouthwash. Mouthwash can be used on an as needed basis. I have prescribed it for up to 3 times a day but you can start 2 times a day to see if this will help your symptoms.  Also, please make an appointment with your dentist for an evaluation.  [facility] to review your current medications and verify ones that will need to be stopped prior to your colonoscopy.    Canker Sores Canker sores are small, painful sores that develop inside your mouth. They may also be called aphthous ulcers. You can get canker sores on the inside of your lips or cheeks, on your tongue, or anywhere inside your mouth. You can have just one canker sore or several of them. Canker sores cannot be passed from one person to another (noncontagious). These sores are different than the sores that you may get on the outside of your lips (cold sores or fever blisters). Canker sores usually start as painful red bumps. Then they turn into small white, yellow, or gray ulcers that have red borders. The ulcers may be quite painful. The pain may be worse when you eat or drink. What are the causes? The cause of this condition is not known. What increases the risk? This condition is more likely to develop in:  Women.  People in their teens or 52s.  Women who are having their menstrual period.  People who are under a lot of emotional stress.  People who do not get enough iron or B vitamins.  People who have poor oral hygiene.  People who have an injury inside the mouth. This can happen after having dental work or from chewing something hard.  What are the signs or symptoms? Along with the canker sore, symptoms may also include:  Fever.  Fatigue.  Swollen lymph nodes in your neck.  How is this diagnosed? This condition can be diagnosed based on your symptoms. Your health care provider will also examine your mouth. Your health care provider may  also do tests if you get canker sores often or if they are very bad. Tests may include:  Blood tests to rule out other causes of canker sores.  Taking swabs from the sore to check for infection.  Taking a small piece of skin from the sore (biopsy) to test it for cancer.  How is this treated? Most canker sores clear up without treatment in about 10 days. Home care is usually the only treatment that you will need. Over-the-counter medicines can relieve discomfort.If you have severe canker sores, your health care provider may prescribe:  Numbing ointment to relieve pain.  Vitamins.  Steroid medicines. These may be given as: ? Oral pills. ? Mouth rinses. ? Gels.  Antibiotic mouth rinse.  Follow these instructions at home:  Apply, take, or use medicines only as directed by your health care provider. These include vitamins.  If you were prescribed an antibiotic mouth rinse, finish all of it even if you start to feel better.  Until the sores are healed: ? Do not drink coffee or citrus juices. ? Do not eat spicy or salty foods.  Use a mild, over-the-counter mouth rinse as directed by your health care provider.  Practice good oral hygiene. ? Floss your teeth every day. ? Brush your teeth with a soft brush twice each day. Contact a health care provider if:  Your symptoms do not get better after two weeks.  You also have a fever  or swollen glands.  You get canker sores often.  You have a canker sore that is getting larger.  You cannot eat or drink due to your canker sores. This information is not intended to replace advice given to you by your health care provider. Make sure you discuss any questions you have with your health care provider. Document Released: 12/31/2010 Document Revised: 02/11/2016 Document Reviewed: 08/06/2014 Elsevier Interactive Patient Education  [employer].

## 2018-01-01 ENCOUNTER — Telehealth: Payer: Self-pay | Admitting: Gastroenterology

## 2018-01-01 NOTE — Telephone Encounter (Signed)
Pt is calling to find out when she is supposed to stop taking her rx before procedure [phone]

## 2018-01-03 ENCOUNTER — Other Ambulatory Visit: Payer: Self-pay | Admitting: Family

## 2018-01-08 ENCOUNTER — Ambulatory Visit: Payer: Medicare Other | Admitting: Anesthesiology

## 2018-01-08 ENCOUNTER — Encounter: Payer: Self-pay | Admitting: *Deleted

## 2018-01-08 ENCOUNTER — Ambulatory Visit
Admission: RE | Admit: 2018-01-08 | Discharge: 2018-01-08 | Disposition: A | Discharge status: Home / Self Care | Payer: Medicare Other | Source: Ambulatory Visit | Attending: Gastroenterology | Admitting: Gastroenterology

## 2018-01-08 ENCOUNTER — Encounter
Admission: RE | Disposition: A | Discharge status: Home / Self Care | Payer: Self-pay | Source: Ambulatory Visit | Attending: Gastroenterology

## 2018-01-08 DIAGNOSIS — D125 Benign neoplasm of sigmoid colon: Secondary | ICD-10-CM | POA: Insufficient documentation

## 2018-01-08 DIAGNOSIS — D369 Benign neoplasm, unspecified site: Secondary | ICD-10-CM | POA: Diagnosis not present

## 2018-01-08 DIAGNOSIS — R911 Solitary pulmonary nodule: Secondary | ICD-10-CM | POA: Diagnosis not present

## 2018-01-08 DIAGNOSIS — E079 Disorder of thyroid, unspecified: Secondary | ICD-10-CM | POA: Diagnosis not present

## 2018-01-08 DIAGNOSIS — D122 Benign neoplasm of ascending colon: Secondary | ICD-10-CM

## 2018-01-08 DIAGNOSIS — Z86010 Personal history of colonic polyps: Secondary | ICD-10-CM | POA: Diagnosis not present

## 2018-01-08 DIAGNOSIS — Z7984 Long term (current) use of oral hypoglycemic drugs: Secondary | ICD-10-CM | POA: Insufficient documentation

## 2018-01-08 DIAGNOSIS — Z7982 Long term (current) use of aspirin: Secondary | ICD-10-CM | POA: Diagnosis not present

## 2018-01-08 DIAGNOSIS — Z87891 Personal history of nicotine dependence: Secondary | ICD-10-CM | POA: Insufficient documentation

## 2018-01-08 DIAGNOSIS — E785 Hyperlipidemia, unspecified: Secondary | ICD-10-CM | POA: Diagnosis not present

## 2018-01-08 DIAGNOSIS — K640 First degree hemorrhoids: Secondary | ICD-10-CM | POA: Diagnosis not present

## 2018-01-08 DIAGNOSIS — I10 Essential (primary) hypertension: Secondary | ICD-10-CM | POA: Insufficient documentation

## 2018-01-08 DIAGNOSIS — E890 Postprocedural hypothyroidism: Secondary | ICD-10-CM | POA: Diagnosis not present

## 2018-01-08 DIAGNOSIS — Z1211 Encounter for screening for malignant neoplasm of colon: Secondary | ICD-10-CM | POA: Diagnosis not present

## 2018-01-08 DIAGNOSIS — E119 Type 2 diabetes mellitus without complications: Secondary | ICD-10-CM | POA: Insufficient documentation

## 2018-01-08 DIAGNOSIS — Z79899 Other long term (current) drug therapy: Secondary | ICD-10-CM | POA: Insufficient documentation

## 2018-01-08 DIAGNOSIS — M1990 Unspecified osteoarthritis, unspecified site: Secondary | ICD-10-CM | POA: Diagnosis not present

## 2018-01-08 DIAGNOSIS — D3A8 Other benign neuroendocrine tumors: Secondary | ICD-10-CM | POA: Insufficient documentation

## 2018-01-08 DIAGNOSIS — I2510 Atherosclerotic heart disease of native coronary artery without angina pectoris: Secondary | ICD-10-CM | POA: Insufficient documentation

## 2018-01-08 HISTORY — PX: COLONOSCOPY WITH PROPOFOL: SHX5780

## 2018-01-08 LAB — GLUCOSE, CAPILLARY: Glucose-Capillary: 111 mg/dL — ABNORMAL HIGH (ref 65–99)

## 2018-01-08 SURGERY — COLONOSCOPY WITH PROPOFOL
Anesthesia: General

## 2018-01-08 MED ORDER — PROPOFOL 10 MG/ML IV BOLUS
INTRAVENOUS | Status: AC
  Filled 2018-01-08: qty 20

## 2018-01-08 MED ORDER — PROPOFOL 500 MG/50ML IV EMUL
INTRAVENOUS | Status: DC | PRN
  Administered 2018-01-08: 150 ug/kg/min via INTRAVENOUS

## 2018-01-08 MED ORDER — PROPOFOL 10 MG/ML IV BOLUS
INTRAVENOUS | Status: DC | PRN
  Administered 2018-01-08: 60 mg via INTRAVENOUS

## 2018-01-08 MED ORDER — SODIUM CHLORIDE 0.9 % IV SOLN
INTRAVENOUS | Status: DC
  Administered 2018-01-08: 1000 mL via INTRAVENOUS

## 2018-01-08 NOTE — H&P (Signed)
[name], MD [address] [place], [place], [place], [address] [address], [place], [place], [place], [address] Phone: [phone]  Fax: [phone]  Primary Care Physician:  Burnard Hawthorne, FNP   Pre-Procedure History & Physical: HPI:  Cynthia [name] is a 72 y.o. female is here for an colonoscopy.   Past Medical History:  Diagnosis Date  . Arthritis   . Coronary artery disease   . Diabetes mellitus without complication ([place])   . Hyperlipidemia   . Hypertension   . Lung nodule   . Thyroid disease     Past Surgical History:  Procedure Laterality Date  . BREAST BIOPSY Left 11/25/2015   stereo  neg  . BREAST EXCISIONAL BIOPSY Left yrs ago   benign  . COLONOSCOPY WITH PROPOFOL N/A 04/20/2017   Procedure: COLONOSCOPY WITH PROPOFOL;  Surgeon: [name], MD;  Location: [facility] ENDOSCOPY;  Service: Endoscopy;  Laterality: N/A;  . THYROIDECTOMY     Dr. [name]  . VAGINAL DELIVERY     4    Prior to Admission medications   Medication Sig Start Date End Date Taking? Authorizing Provider  amLODipine (NORVASC) 10 MG tablet TAKE 1 TABLET (10 MG TOTAL) BY MOUTH DAILY. 02/15/17  Yes Burnard Hawthorne, FNP  aspirin 81 MG tablet Take 81 mg by mouth daily.   Yes [provider]  atorvastatin (LIPITOR) 20 MG tablet TAKE 1 TABLET (20 MG TOTAL) BY MOUTH DAILY. 01/04/18  Yes Burnard Hawthorne, FNP  BOOSTRIX 5-2.5-18.5 injection  05/17/16  Yes [provider]  Calcium Carbonate-Vitamin D 600-400 MG-UNIT per tablet Take 1 tablet by mouth daily.   Yes [provider]  gabapentin (NEURONTIN) 100 MG capsule TAKE ONE CAPSULE BY MOUTH 3 TIMES A DAY 11/07/17  Yes Arnett, Yvetta Coder, FNP  glucose blood (ONE TOUCH ULTRA TEST) test strip TEST BLOOD SUGAR 1-2 TIMES A DAY 10/25/17  Yes Arnett, Yvetta Coder, FNP  levothyroxine (SYNTHROID, LEVOTHROID) 137 MCG tablet TAKE 1 TABLET (137 MCG TOTAL) BY MOUTH DAILY BEFORE BREAKFAST. 07/10/17  Yes Burnard Hawthorne, FNP    losartan-hydrochlorothiazide (HYZAAR) 50-12.5 MG tablet TAKE 1 TABLET BY MOUTH EVERY DAY 09/22/17  Yes Burnard Hawthorne, FNP  magic mouthwash SOLN Take 5 mLs by mouth 3 (three) times daily as needed for mouth pain. 12/28/17  Yes Kordsmeier, Gregary Signs, FNP  meloxicam (MOBIC) 15 MG tablet Take 1 tablet (15 mg total) by mouth daily. 07/18/17  Yes Burnard Hawthorne, FNP  metFORMIN (GLUCOPHAGE) 500 MG tablet TAKE 1 TABLET (500 MG TOTAL) BY MOUTH 2 (TWO) TIMES DAILY WITH A MEAL. 12/25/17  Yes Arnett, Yvetta Coder, FNP  metoprolol tartrate (LOPRESSOR) 25 MG tablet Take 1 tablet (25 mg total) by mouth 2 (two) times daily. 02/15/17  Yes Burnard Hawthorne, FNP  omeprazole (PRILOSEC) 20 MG capsule TAKE 1 CAPSULE BY MOUTH EVERY DAY 12/04/17  Yes Arnett, Yvetta Coder, FNP  ONETOUCH DELICA LANCETS FINE MISC CHECK BLOOD GLUCOSE TWICE A DAY 02/15/17  Yes Burnard Hawthorne, FNP  cyclobenzaprine (FLEXERIL) 5 MG tablet Take 1 tablet (5 mg total) by mouth at bedtime. 12/07/17   Menshew, Dannielle Karvonen, PA-C  gabapentin (NEURONTIN) 100 MG capsule TAKE 1 CAPSULE BY MOUTH THREE TIMES A DAY 08/23/17   Burnard Hawthorne, FNP  levothyroxine (SYNTHROID, LEVOTHROID) 137 MCG tablet Take 1 tablet (137 mcg total) by mouth daily before breakfast. 07/07/17   Burnard Hawthorne, FNP    Allergies as of 12/13/2017  . (No Known  Allergies)    Family History  Problem Relation Age of Onset  . Hypertension Mother   . Hypertension Father   . Diabetes Sister   . Thyroid disease Sister   . Breast cancer Neg Hx     Social History   Socioeconomic History  . Marital status: Married    Spouse name: Not on file  . Number of children: Not on file  . Years of education: Not on file  . Highest education level: Not on file  Occupational History  . Not on file  Social Needs  . Financial resource strain: Not on file  . Food insecurity:    Worry: Not on file    Inability: Not on file  . Transportation needs:    Medical: Not on file     Non-medical: Not on file  Tobacco Use  . Smoking status: Former Smoker    Years: 20.00    Types: Cigarettes    Last attempt to quit: 11/17/2012    Years since quitting: 5.1  . Smokeless tobacco: Former Network engineer and Sexual Activity  . Alcohol use: No  . Drug use: No  . Sexual activity: Never  Lifestyle  . Physical activity:    Days per week: Not on file    Minutes per session: Not on file  . Stress: Not on file  Relationships  . Social connections:    Talks on phone: Not on file    Gets together: Not on file    Attends religious service: Not on file    Active member of club or organization: Not on file    Attends meetings of clubs or organizations: Not on file    Relationship status: Not on file  . Intimate partner violence:    Fear of current or ex partner: Not on file    Emotionally abused: Not on file    Physically abused: Not on file    Forced sexual activity: Not on file  Other Topics Concern  . Not on file  Social History Narrative   Lives in [place] with husband. Has 4 children.      4 grandchildren.      Work - Retired from [employer]- walking the track, 4x per week      Diet- regular       Review of Systems: See HPI, otherwise negative ROS  Physical Exam: BP 140/82   Pulse 80   Temp (!) 95.9 F (35.5 C) (Tympanic)   Resp 20   SpO2 100%  General:   Alert,  pleasant and cooperative in NAD Head:  Normocephalic and atraumatic. Neck:  Supple; no masses or thyromegaly. Lungs:  Clear throughout to auscultation, normal respiratory effort.    Heart:  +S1, +S2, Regular rate and rhythm, No edema. Abdomen:  Soft, nontender and nondistended. Normal bowel sounds, without guarding, and without rebound.   Neurologic:  Alert and  oriented x4;  grossly normal neurologically.  Impression/Plan: Cynthia [name] is here for an colonoscopy to be performed for surveillance due to prior history of colon polyps   Risks, benefits, limitations,  and alternatives regarding  colonoscopy have been reviewed with the patient.  Questions have been answered.  All parties agreeable.   [name], MD  01/08/2018, 8:50 AM

## 2018-01-08 NOTE — Transfer of Care (Signed)
Immediate Anesthesia Transfer of Care Note  Patient: Cynthia [name]  Procedure(s) Performed: COLONOSCOPY WITH PROPOFOL (N/A )  Patient Location: PACU  Anesthesia Type:General  Level of Consciousness: sedated  Airway & Oxygen Therapy: Patient Spontanous Breathing and Patient connected to nasal cannula oxygen  Post-op Assessment: Report given to RN and Post -op Vital signs reviewed and stable  Post vital signs: Reviewed and stable  Last Vitals:  Vitals Value Taken Time  BP    Temp    Pulse 79 01/08/2018  9:27 AM  Resp 28 01/08/2018  9:27 AM  SpO2 96 % 01/08/2018  9:27 AM  Vitals shown include unvalidated device data.  Last Pain:  Vitals:   01/08/18 0827  TempSrc: Tympanic         Complications: No apparent anesthesia complications

## 2018-01-08 NOTE — Anesthesia Preprocedure Evaluation (Signed)
Anesthesia Evaluation  Patient identified by MRN, date of birth, ID band Patient awake    Reviewed: Allergy & Precautions, NPO status , Patient's Chart, lab work & pertinent test results  History of Anesthesia Complications Negative for: history of anesthetic complications  Airway Mallampati: II       Dental  (+) Upper Dentures, Lower Dentures   Pulmonary COPD, former smoker,     + decreased breath sounds      Cardiovascular Exercise Tolerance: Good hypertension, Pt. on medications and Pt. on home beta blockers + CAD   Rhythm:Regular Rate:Normal     Neuro/Psych negative neurological ROS  negative psych ROS   GI/Hepatic negative GI ROS, Neg liver ROS,   Endo/Other  diabetes, Type 2, Oral Hypoglycemic AgentsHypothyroidism Morbid obesity  Renal/GU negative Renal ROS     Musculoskeletal   Abdominal (+) + obese,   Peds negative pediatric ROS (+)  Hematology   Anesthesia Other Findings Past Medical History: No date: Arthritis No date: Coronary artery disease No date: Diabetes mellitus without complication ([facility]) No date: Hyperlipidemia No date: Hypertension No date: Lung nodule No date: Thyroid disease   Reproductive/Obstetrics                             Anesthesia Physical  Anesthesia Plan  ASA: III  Anesthesia Plan: General   Post-op Pain Management:    Induction: Intravenous  PONV Risk Score and Plan: 3 and Propofol infusion  Airway Management Planned: Natural Airway and Nasal Cannula  Additional Equipment:   Intra-op Plan:   Post-operative Plan:   Informed Consent: I have reviewed the patients History and Physical, chart, labs and discussed the procedure including the risks, benefits and alternatives for the proposed anesthesia with the patient or authorized representative who has indicated his/her understanding and acceptance.     Plan Discussed with:  CRNA  Anesthesia Plan Comments:         Anesthesia Quick Evaluation

## 2018-01-08 NOTE — Anesthesia Postprocedure Evaluation (Signed)
Anesthesia Post Note  Patient: [name]  Procedure(s) Performed: COLONOSCOPY WITH PROPOFOL (N/A )  Patient location during evaluation: Endoscopy Anesthesia Type: General Level of consciousness: awake and alert Pain management: pain level controlled Vital Signs Assessment: post-procedure vital signs reviewed and stable Respiratory status: spontaneous breathing, nonlabored ventilation, respiratory function stable and patient connected to nasal cannula oxygen Cardiovascular status: blood pressure returned to baseline and stable Postop Assessment: no apparent nausea or vomiting Anesthetic complications: no     Last Vitals:  Vitals:   01/08/18 0827 01/08/18 0928  BP: 140/82 124/84  Pulse: 80   Resp: 20 16  Temp: (!) 35.5 C (!) 36.1 C  SpO2: 100% 95%    Last Pain:  Vitals:   01/08/18 0928  TempSrc: Tympanic                 Martha Clan

## 2018-01-08 NOTE — Anesthesia Post-op Follow-up Note (Signed)
Anesthesia QCDR form completed.        

## 2018-01-08 NOTE — Op Note (Signed)
[facility] Gastroenterology Patient Name: Cynthia [name] Procedure Date: 01/08/2018 8:54 AM MRN: [id number] Account #: [id number] Date of Birth: [birth year] Admit Type: Outpatient Age: 72 Room: [facility] ENDO ROOM 4 Gender: Female Note Status: Finalized Procedure:            Colonoscopy Indications:          High risk colon cancer surveillance: Personal history                        of colonic polyps Providers:            [name] MD, MD Referring MD:         Yvetta Coder. Arnett (Referring MD) Medicines:            Monitored Anesthesia Care Complications:        No immediate complications. Procedure:            Pre-Anesthesia Assessment:                       - Prior to the procedure, a History and Physical was                        performed, and patient medications, allergies and                        sensitivities were reviewed. The patient's tolerance of                        previous anesthesia was reviewed.                       - The risks and benefits of the procedure and the                        sedation options and risks were discussed with the                        patient. All questions were answered and informed                        consent was obtained.                       - ASA Grade Assessment: III - A patient with severe                        systemic disease.                       After obtaining informed consent, the colonoscope was                        passed under direct vision. Throughout the procedure,                        the patient's blood pressure, pulse, and oxygen                        saturations were monitored continuously. The                        Colonoscope  was introduced through the anus and                        advanced to the the cecum, identified by the                        appendiceal orifice, IC valve and transillumination.                        The colonoscopy was performed with ease. The patient                    tolerated the procedure well. The quality of the bowel                        preparation was good. Findings:      The perianal and digital rectal examinations were normal.      Three sessile polyps were found in the ascending colon. The polyps were       5 to 7 mm in size. These polyps were removed with a cold snare.       Resection and retrieval were complete.      Six sessile polyps were found in the sigmoid colon. The polyps were 4 to       6 mm in size. These polyps were removed with a cold snare. Resection and       retrieval were complete.      Non-bleeding internal hemorrhoids were found during retroflexion. The       hemorrhoids were medium-sized and Grade I (internal hemorrhoids that do       not prolapse).      The exam was otherwise without abnormality on direct and retroflexion       views. Impression:           - Three 5 to 7 mm polyps in the ascending colon,                        removed with a cold snare. Resected and retrieved.                       - Six 4 to 6 mm polyps in the sigmoid colon, removed                        with a cold snare. Resected and retrieved.                       - Non-bleeding internal hemorrhoids.                       - The examination was otherwise normal on direct and                        retroflexion views. Recommendation:       - Discharge patient to home (with escort).                       - Resume previous diet.                       - Continue present medications.                       -  Await pathology results.                       - Repeat colonoscopy for surveillance based on                        pathology results. Procedure Code(s):    --- Professional ---                       [phone], Colonoscopy, flexible; with removal of tumor(s),                        polyp(s), or other lesion(s) by snare technique Diagnosis Code(s):    --- Professional ---                       Z86.010, Personal history of colonic  polyps                       D12.2, Benign neoplasm of ascending colon                       D12.5, Benign neoplasm of sigmoid colon                       K64.0, First degree hemorrhoids CPT copyright 2017 American Medical Association. All rights reserved. The codes documented in this report are preliminary and upon coder review may  be revised to meet current compliance requirements. [name], MD [name] MD, MD 01/08/2018 9:25:59 AM This report has been signed electronically. Number of Addenda: 0 Note Initiated On: 01/08/2018 8:54 AM Scope Withdrawal Time: 0 hours 16 minutes 24 seconds  Total Procedure Duration: 0 hours 23 minutes 27 seconds       [facility]

## 2018-01-08 NOTE — Anesthesia Procedure Notes (Signed)
Date/Time: 01/08/2018 9:01 AM Performed by: Nelda Marseille, CRNA Pre-anesthesia Checklist: Patient identified, Emergency Drugs available, Suction available, Patient being monitored and Timeout performed Oxygen Delivery Method: Nasal cannula

## 2018-01-11 ENCOUNTER — Other Ambulatory Visit: Payer: Self-pay | Admitting: Pathology

## 2018-01-11 LAB — SURGICAL PATHOLOGY

## 2018-01-17 ENCOUNTER — Telehealth: Payer: Self-pay

## 2018-01-17 NOTE — Telephone Encounter (Signed)
Advised patient of results per Dr. Vicente Males:   - Inform she had multiple polyps taken out , one of the polyps from the sigmoid colon showed a tiny "tumor" , we need to repeat colonoscopy in 6 months to ensure that no other similar polyps are present. I also suggest she be seen by DR Tasia Catchings in oncology for her opinion . Probably would not do much further at this time since the polyp was extremely small . I will C/c Dr Tasia Catchings and Vidal Schwalbe Yvetta Coder, FNP   Pt has appt with Dr. Tasia Catchings on 5/7

## 2018-01-18 NOTE — Progress Notes (Signed)
Presented at weekly MDT. Recommendations:  1. May attempt to locate site of removal for tattooing and EMR  2. Surveillance 3. Dotatate imaging and surveillance Oncology Nurse Navigator Documentation  Navigator Location: [facility] (01/18/18 1300)   )Navigator Encounter Type: (MDT ) (01/18/18 1300)   Abnormal Finding Date: 01/08/18 (01/18/18 1300) Confirmed Diagnosis Date: 01/11/18 (01/18/18 1300)           Multidisiplinary Clinic Type: GI (01/18/18 1300) Treatment Initiated Date: 01/11/18 (01/18/18 1300)                      Acuity: Level 1 (01/18/18 1300)         Time Spent with Patient: 30 (01/18/18 1300)

## 2018-01-22 DIAGNOSIS — C7A8 Other malignant neuroendocrine tumors: Secondary | ICD-10-CM | POA: Insufficient documentation

## 2018-01-23 ENCOUNTER — Encounter: Payer: Self-pay | Admitting: Oncology

## 2018-01-23 ENCOUNTER — Inpatient Hospital Stay: Payer: Medicare Other

## 2018-01-23 ENCOUNTER — Inpatient Hospital Stay: Payer: Medicare Other | Attending: Oncology | Admitting: Oncology

## 2018-01-23 ENCOUNTER — Other Ambulatory Visit: Payer: Self-pay

## 2018-01-23 DIAGNOSIS — Z8249 Family history of ischemic heart disease and other diseases of the circulatory system: Secondary | ICD-10-CM | POA: Insufficient documentation

## 2018-01-23 DIAGNOSIS — Z7984 Long term (current) use of oral hypoglycemic drugs: Secondary | ICD-10-CM | POA: Diagnosis not present

## 2018-01-23 DIAGNOSIS — D3A8 Other benign neuroendocrine tumors: Secondary | ICD-10-CM | POA: Diagnosis present

## 2018-01-23 DIAGNOSIS — E119 Type 2 diabetes mellitus without complications: Secondary | ICD-10-CM | POA: Diagnosis not present

## 2018-01-23 DIAGNOSIS — Z79899 Other long term (current) drug therapy: Secondary | ICD-10-CM | POA: Insufficient documentation

## 2018-01-23 DIAGNOSIS — I10 Essential (primary) hypertension: Secondary | ICD-10-CM | POA: Diagnosis not present

## 2018-01-23 DIAGNOSIS — C7A8 Other malignant neuroendocrine tumors: Secondary | ICD-10-CM | POA: Insufficient documentation

## 2018-01-23 DIAGNOSIS — Z7982 Long term (current) use of aspirin: Secondary | ICD-10-CM | POA: Diagnosis not present

## 2018-01-23 DIAGNOSIS — Z87891 Personal history of nicotine dependence: Secondary | ICD-10-CM | POA: Insufficient documentation

## 2018-01-23 LAB — CBC WITH DIFFERENTIAL/PLATELET
Basophils Absolute: 0 K/uL (ref 0–0.1)
Basophils Relative: 1 %
Eosinophils Absolute: 0.2 K/uL (ref 0–0.7)
Eosinophils Relative: 4 %
HCT: 41.6 % (ref 35.0–47.0)
Hemoglobin: 14.3 g/dL (ref 12.0–16.0)
Lymphocytes Relative: 40 %
Lymphs Abs: 1.8 K/uL (ref 1.0–3.6)
MCH: 31.9 pg (ref 26.0–34.0)
MCHC: 34.4 g/dL (ref 32.0–36.0)
MCV: 92.9 fL (ref 80.0–100.0)
Monocytes Absolute: 0.8 K/uL (ref 0.2–0.9)
Monocytes Relative: 19 %
Neutro Abs: 1.6 K/uL (ref 1.4–6.5)
Neutrophils Relative %: 36 %
Platelets: 272 K/uL (ref 150–440)
RBC: 4.48 MIL/uL (ref 3.80–5.20)
RDW: 13.9 % (ref 11.5–14.5)
WBC: 4.4 K/uL (ref 3.6–11.0)

## 2018-01-23 LAB — COMPREHENSIVE METABOLIC PANEL
ALK PHOS: 63 U/L (ref 38–126)
ALT: 27 U/L (ref 14–54)
ANION GAP: 11 (ref 5–15)
AST: 34 U/L (ref 15–41)
Albumin: 4.2 g/dL (ref 3.5–5.0)
BUN: 12 mg/dL (ref 6–20)
CALCIUM: 9.9 mg/dL (ref 8.9–10.3)
CHLORIDE: 102 mmol/L (ref 101–111)
CO2: 25 mmol/L (ref 22–32)
CREATININE: 0.75 mg/dL (ref 0.44–1.00)
Glucose, Bld: 86 mg/dL (ref 65–99)
Potassium: 3.7 mmol/L (ref 3.5–5.1)
SODIUM: 138 mmol/L (ref 135–145)
Total Bilirubin: 0.7 mg/dL (ref 0.3–1.2)
Total Protein: 7.7 g/dL (ref 6.5–8.1)

## 2018-01-23 NOTE — Progress Notes (Signed)
Met with Cynthia [name] prior to consult. Introduced Therapist, nutritional and provided contact information for any future needs. No needs at present. Oncology Nurse Navigator Documentation  Navigator Location: [facility] (01/23/18 1100)   )Navigator Encounter Type: Initial MedOnc (01/23/18 1100)                 Multidisiplinary Clinic Type: GI (01/23/18 1100)                                  Time Spent with Patient: 15 (01/23/18 1100)

## 2018-01-23 NOTE — Progress Notes (Signed)
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   HISTORY OF PRESENTING ILLNESS:  Cynthia [name] is a  72 y.o.  female with PMH listed below who was referred to me for evaluation of neuroendocrine cancer  She recently have screening colonoscopy done and was found to have 3 ascending colon polyps and 6 sigmoid colon polyps, all removed and retrieved. All three ascending colon polyps were tubular adenoma. 5 sigmoid colon polyps were hyperplastic polyps. 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   Patient is accompanied by her sister to clinic today. Reports feeling well. Denies any diarrhea, flushes, abdominal pain, blood in stool.  Review of Systems  Constitutional: Negative for chills, fever, malaise/fatigue and weight loss.  HENT: Negative for congestion, ear discharge, ear pain, nosebleeds, sinus pain and sore throat.   Eyes: Negative for double vision, photophobia, pain, discharge and redness.  Respiratory: Negative for cough, hemoptysis, sputum production, shortness of breath and wheezing.   Cardiovascular: Negative for chest pain, palpitations, orthopnea, claudication and leg swelling.  Gastrointestinal: Negative for abdominal pain, blood in stool, constipation, diarrhea, heartburn, melena, nausea and vomiting.  Genitourinary: Negative for dysuria, flank pain, frequency and hematuria.  Musculoskeletal: Negative for back pain, myalgias and neck pain.  Skin: Negative for itching and rash.  Neurological: Negative for dizziness, tingling, tremors, focal weakness, weakness and headaches.    Endo/Heme/Allergies: Negative for environmental allergies. Does not bruise/bleed easily.  Psychiatric/Behavioral: Negative for depression and hallucinations. The patient is not nervous/anxious.     MEDICAL HISTORY:  Past Medical History:  Diagnosis Date  . Arthritis   . Coronary artery disease   . Diabetes mellitus without complication ([place])   . Hyperlipidemia   . Hypertension   . Lung nodule   . Thyroid disease     SURGICAL HISTORY: Past Surgical History:  Procedure Laterality Date  . BREAST BIOPSY Left 11/25/2015   stereo  neg  . BREAST EXCISIONAL BIOPSY Left yrs ago   benign  . COLONOSCOPY WITH PROPOFOL N/A 04/20/2017   Procedure: COLONOSCOPY WITH PROPOFOL;  Surgeon: [name], MD;  Location: [facility] ENDOSCOPY;  Service: Endoscopy;  Laterality: N/A;  . COLONOSCOPY WITH PROPOFOL N/A 01/08/2018   Procedure: COLONOSCOPY WITH PROPOFOL;  Surgeon: [name], MD;  Location: [facility] ENDOSCOPY;  Service: Gastroenterology;  Laterality: N/A;  . THYROIDECTOMY     Dr. [name]  . VAGINAL DELIVERY     4    SOCIAL HISTORY: Social History   Socioeconomic History  . Marital status: Married    Spouse name: Not on file  . Number of children: 4  . Years of education: Not on file  . Highest education level: Not on file  Occupational History  . Not on file  Social Needs  . Financial resource strain: Not on file  . Food insecurity:    Worry: Not on file    Inability: Not on file  . Transportation needs:    Medical: Not on file    Non-medical: Not on file  Tobacco Use  . Smoking status: Former Smoker    Packs/day: 1.00    Years: 20.00    Pack years: 20.00    Types: Cigarettes    Last attempt to  quit: 11/17/2012    Years since quitting: 5.1  . Smokeless tobacco: Former Network engineer and Sexual Activity  . Alcohol use: No  . Drug use: No  . Sexual activity: Never  Lifestyle  . Physical activity:    Days per week: Not on file    Minutes per session: Not on file  . Stress: Not  on file  Relationships  . Social connections:    Talks on phone: Not on file    Gets together: Not on file    Attends religious service: Not on file    Active member of club or organization: Not on file    Attends meetings of clubs or organizations: Not on file    Relationship status: Not on file  . Intimate partner violence:    Fear of current or ex partner: Not on file    Emotionally abused: Not on file    Physically abused: Not on file    Forced sexual activity: Not on file  Other Topics Concern  . Not on file  Social History Narrative   Lives in [place] with husband. Has 4 children.      4 grandchildren.      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 Hypertension Mother   . Hypertension Father   . Diabetes Sister   . Thyroid disease Sister   . Breast cancer Maternal Aunt   . Breast cancer Maternal Aunt     ALLERGIES:  has No Known Allergies.  MEDICATIONS:  Current Outpatient Medications  Medication Sig Dispense Refill  . amLODipine (NORVASC) 10 MG tablet TAKE 1 TABLET (10 MG TOTAL) BY MOUTH DAILY. 90 tablet 3  . aspirin 81 MG tablet Take 81 mg by mouth daily.    Marland Kitchen atorvastatin (LIPITOR) 20 MG tablet TAKE 1 TABLET (20 MG TOTAL) BY MOUTH DAILY. 90 tablet 0  . Calcium Carbonate-Vitamin D 600-400 MG-UNIT per tablet Take 1 tablet by mouth daily.    Marland Kitchen gabapentin (NEURONTIN) 100 MG capsule TAKE ONE CAPSULE BY MOUTH 3 TIMES A DAY 90 capsule 1  . glucose blood (ONE TOUCH ULTRA TEST) test strip TEST BLOOD SUGAR 1-2 TIMES A DAY 100 each 4  . levothyroxine (SYNTHROID, LEVOTHROID) 137 MCG tablet TAKE 1 TABLET (137 MCG TOTAL) BY MOUTH DAILY BEFORE BREAKFAST. 90 tablet 1  . losartan-hydrochlorothiazide (HYZAAR) 50-12.5 MG tablet TAKE 1 TABLET BY MOUTH EVERY DAY 90 tablet 1  . meloxicam (MOBIC) 15 MG tablet Take 1 tablet (15 mg total) by mouth  daily. 90 tablet 1  . metFORMIN (GLUCOPHAGE) 500 MG tablet TAKE 1 TABLET (500 MG TOTAL) BY MOUTH 2 (TWO) TIMES DAILY WITH A MEAL. 180 tablet 1  . metoprolol tartrate (LOPRESSOR) 25 MG tablet Take 1 tablet (25 mg total) by mouth 2 (two) times daily. 90 tablet 1  . omeprazole (PRILOSEC) 20 MG capsule TAKE 1 CAPSULE BY MOUTH EVERY DAY 90 capsule 1  . ONETOUCH DELICA LANCETS FINE MISC CHECK BLOOD GLUCOSE TWICE A DAY 100 each 3   No current facility-administered medications for this visit.      PHYSICAL EXAMINATION: ECOG PERFORMANCE STATUS: 0 - Asymptomatic Vitals:   01/24/18 1332  BP: 126/80  Pulse: 73  Temp: 97.7 F (36.5 C)   Filed Weights   01/24/18 1332  Weight:  247 lb 2 oz (112.1 kg)    Physical Exam  Constitutional: She is oriented to person, place, and time. She appears well-developed and well-nourished. No distress.  HENT:  Head: Normocephalic and atraumatic.  Right Ear: External ear normal.  Left Ear: External ear normal.  Mouth/Throat: Oropharynx is clear and moist.  Eyes: Pupils are equal, round, and reactive to light. Conjunctivae and EOM are normal. No scleral icterus.  Neck: Normal range of motion. Neck supple.  Cardiovascular: Normal rate, regular rhythm and normal heart sounds.  Pulmonary/Chest: Effort normal and breath sounds normal. No respiratory distress. She has no wheezes. She has no rales. She exhibits no tenderness.  Abdominal: Soft. Bowel sounds are normal. She exhibits no distension and no mass. There is no tenderness.  Musculoskeletal: Normal range of motion. She exhibits no edema or deformity.  Lymphadenopathy:    She has no cervical adenopathy.  Neurological: She is alert and oriented to person, place, and time. No cranial nerve deficit. Coordination normal.  Skin: Skin is warm and dry. No rash noted.  Psychiatric: She has a normal mood and affect. Her behavior is normal. Thought content normal.     LABORATORY DATA:  I have reviewed the data as  listed Lab Results  Component Value Date   WBC 4.4 01/23/2018   HGB 14.3 01/23/2018   HCT 41.6 01/23/2018   MCV 92.9 01/23/2018   PLT 272 01/23/2018   Recent Labs    02/15/17 1417 07/18/17 1508 11/06/17 0936 01/23/18 1155  NA 138 141 138 138  K 3.8 3.6 4.0 3.7  CL 101 103 102 102  CO2 30 30 26 25   GLUCOSE 113* 127* 128* 86  BUN 15 14 15 12   CREATININE 0.79 0.86 0.75 0.75  CALCIUM 10.1 9.8 9.6 9.9  GFRNONAA  --   --   --  >60  GFRAA  --   --   --  >60  PROT 7.5  --   --  7.7  ALBUMIN 4.4  --   --  4.2  AST 22  --   --  34  ALT 23  --   --  27  ALKPHOS 64  --   --  63  BILITOT 0.5  --   --  0.7       ASSESSMENT & PLAN:  1. 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  Her case was presented on 5/2 tumor board. Consensus recommendation was to see if GI could tattoo the site of tumor removal and EMR/ Dotatate imagig and surveillance.  Discussed with Dr.Anna. Given the small size of the tumor, tattoo is not feasible. She will have a follow up colonoscopy in 6 months.  Will obtain Dotatate PET scan. Check cbc, cmp, 5 HIAA, serotonin.  All questions were answered. The patient knows to call the clinic with any problems questions or concerns.  Return of visit: follow up after Dotatate.  Thank you for this kind referral and the opportunity to participate in the care of this patient. A copy of today's note is routed to referring provider    Earlie Server, MD, PhD Hematology Oncology [facility] Pager- [phone] 01/23/2018

## 2018-01-25 LAB — SEROTONIN SERUM: Serotonin, Serum: 111 ng/mL (ref 0–420)

## 2018-01-28 ENCOUNTER — Telehealth: Payer: Self-pay | Admitting: Family

## 2018-01-28 NOTE — Telephone Encounter (Signed)
close

## 2018-01-29 ENCOUNTER — Ambulatory Visit: Payer: Medicare Other

## 2018-02-04 ENCOUNTER — Other Ambulatory Visit: Payer: Self-pay | Admitting: Family

## 2018-02-04 DIAGNOSIS — E1140 Type 2 diabetes mellitus with diabetic neuropathy, unspecified: Secondary | ICD-10-CM

## 2018-02-05 ENCOUNTER — Telehealth: Payer: Self-pay | Admitting: Family

## 2018-02-05 ENCOUNTER — Ambulatory Visit (INDEPENDENT_AMBULATORY_CARE_PROVIDER_SITE_OTHER): Payer: Medicare Other | Admitting: Family

## 2018-02-05 ENCOUNTER — Encounter: Payer: Self-pay | Admitting: Family

## 2018-02-05 VITALS — BP 110/70 | HR 68 | Temp 98.6°F | Resp 15 | Wt 249.0 lb

## 2018-02-05 DIAGNOSIS — K1379 Other lesions of oral mucosa: Secondary | ICD-10-CM | POA: Insufficient documentation

## 2018-02-05 DIAGNOSIS — E1140 Type 2 diabetes mellitus with diabetic neuropathy, unspecified: Secondary | ICD-10-CM

## 2018-02-05 DIAGNOSIS — C7A8 Other malignant neuroendocrine tumors: Secondary | ICD-10-CM | POA: Diagnosis not present

## 2018-02-05 DIAGNOSIS — I10 Essential (primary) hypertension: Secondary | ICD-10-CM

## 2018-02-05 LAB — BASIC METABOLIC PANEL
BUN: 12 mg/dL (ref 6–23)
CALCIUM: 9.4 mg/dL (ref 8.4–10.5)
CO2: 28 mEq/L (ref 19–32)
CREATININE: 0.75 mg/dL (ref 0.40–1.20)
Chloride: 101 mEq/L (ref 96–112)
GFR: 97.6 mL/min (ref >60.00)
Glucose, Bld: 179 mg/dL — ABNORMAL HIGH (ref 70–99)
Potassium: 3.6 mEq/L (ref 3.5–5.1)
Sodium: 137 mEq/L (ref 135–145)

## 2018-02-05 LAB — HEMOGLOBIN A1C: HEMOGLOBIN A1C: 6.5 % (ref 4.6–6.5)

## 2018-02-05 MED ORDER — METOPROLOL TARTRATE 25 MG PO TABS: 25.0000 mg | ORAL_TABLET | Freq: Two times a day (BID) | ORAL | 1 refills | Status: DC

## 2018-02-05 MED ORDER — LOSARTAN POTASSIUM-HCTZ 50-12.5 MG PO TABS: 1.0000 | ORAL_TABLET | Freq: Every day | ORAL | 1 refills | Status: DC

## 2018-02-05 MED ORDER — MAGIC MOUTHWASH W/LIDOCAINE: 5.0000 mL | Freq: Three times a day (TID) | ORAL | 0 refills | Status: AC | PRN

## 2018-02-05 MED ORDER — AMLODIPINE BESYLATE 10 MG PO TABS: ORAL_TABLET | ORAL | 3 refills | Status: DC

## 2018-02-05 NOTE — Telephone Encounter (Signed)
Please advise 

## 2018-02-05 NOTE — Telephone Encounter (Signed)
Refill has been sent in.  

## 2018-02-05 NOTE — Assessment & Plan Note (Signed)
Appears controlled.  Pending A1c.  Patient able to appreciate microfilament test.  Will continue gabapentin.  Education provided on preventing worsening neuropathy.

## 2018-02-05 NOTE — Assessment & Plan Note (Signed)
Will follow. Reviewed Dr Collie Siad note. Pending PET.

## 2018-02-05 NOTE — Assessment & Plan Note (Signed)
Benign exam.  No appreciated lesions.  Patient symptom resolved with Magic mouthwash, appropriate to give 1 more dose of this.  If persistent or recurs, patient understands let me know as he may need to consult dentist, ENT.

## 2018-02-05 NOTE — Telephone Encounter (Signed)
Copied from [place] [id number]. Topic: Quick Communication - Rx Refill/Question >> Feb 05, 2018 10:19 AM Scherrie Gerlach wrote: Medication: magic mouthwash w/lidocaine SOLN  Pharmacy called to request the ratio of this Rx. Please call with back with this info to fill this Rx [id number] - [place], [place] - [address] [phone] (Phone) [phone] (Fax)

## 2018-02-05 NOTE — Assessment & Plan Note (Signed)
At goal. Continue current regimen. 

## 2018-02-05 NOTE — Telephone Encounter (Signed)
Pharmacy is calling trying to get the ratio amounts for the ingredients, contact [facility] asap

## 2018-02-05 NOTE — Patient Instructions (Addendum)
Pleasure seeing you  Labs  Magic Mouthwash- if no better , please call and let me know.

## 2018-02-05 NOTE — Telephone Encounter (Signed)
Spoke with pharmacy- addressed

## 2018-02-05 NOTE — Progress Notes (Signed)
Subjective:    Patient ID: Cynthia [name], female    DOB: [birth year], 72 y.o.   MRN: [id number]  CC: KHADIJATOU BORAK is a 72 y.o. female who presents today for follow up.   HPI: Feels well. No constipation, changes in weight.   HTN- compliant medications. Denies exertional chest pain or pressure, numbness or tingling radiating to left arm or jaw, palpitations, dizziness, frequent headaches, changes in vision, or shortness of breath.    DM- on gabapentin for neuropathy with relief. FBG at home, 116 in the morning.   Continues to note scratchy feel, 'sore' right side of lower gum for past couple of weeks, waxing and waning. . Magic mouthwash was helpful. Would like refill today. No fever, trouble or pain with eating, pain, purulent discharge. Cannot see dentist right now due to cost.     Dr Tasia Catchings- neuroendocrine carcinoma; plan to repeat colonoscopy in 6 months as unable to tattoo. PET ( scheduled)  HISTORY:  Past Medical History:  Diagnosis Date  . Arthritis   . Coronary artery disease   . Diabetes mellitus without complication ([place])   . Hyperlipidemia   . Hypertension   . Lung nodule   . Thyroid disease    Past Surgical History:  Procedure Laterality Date  . BREAST BIOPSY Left 11/25/2015   stereo  neg  . BREAST EXCISIONAL BIOPSY Left yrs ago   benign  . COLONOSCOPY WITH PROPOFOL N/A 04/20/2017   Procedure: COLONOSCOPY WITH PROPOFOL;  Surgeon: [name], MD;  Location: [facility] ENDOSCOPY;  Service: Endoscopy;  Laterality: N/A;  . COLONOSCOPY WITH PROPOFOL N/A 01/08/2018   Procedure: COLONOSCOPY WITH PROPOFOL;  Surgeon: [name], MD;  Location: [facility] ENDOSCOPY;  Service: Gastroenterology;  Laterality: N/A;  . THYROIDECTOMY     Dr. [name]  . VAGINAL DELIVERY     4   Family History  Problem Relation Age of Onset  . Hypertension Mother   . Hypertension Father   . Diabetes Sister   . Thyroid disease Sister   . Breast cancer Maternal Aunt   . Breast cancer Maternal Aunt       Allergies: Patient has no known allergies. Current Outpatient Medications on File Prior to Visit  Medication Sig Dispense Refill  . aspirin 81 MG tablet Take 81 mg by mouth daily.    Marland Kitchen atorvastatin (LIPITOR) 20 MG tablet TAKE 1 TABLET (20 MG TOTAL) BY MOUTH DAILY. 90 tablet 0  . Calcium Carbonate-Vitamin D 600-400 MG-UNIT per tablet Take 1 tablet by mouth daily.    Marland Kitchen gabapentin (NEURONTIN) 100 MG capsule TAKE ONE CAPSULE BY MOUTH 3 TIMES A DAY 90 capsule 1  . glucose blood (ONE TOUCH ULTRA TEST) test strip TEST BLOOD SUGAR 1-2 TIMES A DAY 100 each 4  . levothyroxine (SYNTHROID, LEVOTHROID) 137 MCG tablet TAKE 1 TABLET (137 MCG TOTAL) BY MOUTH DAILY BEFORE BREAKFAST. 90 tablet 1  . meloxicam (MOBIC) 15 MG tablet Take 1 tablet (15 mg total) by mouth daily. 90 tablet 1  . metFORMIN (GLUCOPHAGE) 500 MG tablet TAKE 1 TABLET (500 MG TOTAL) BY MOUTH 2 (TWO) TIMES DAILY WITH A MEAL. 180 tablet 1  . omeprazole (PRILOSEC) 20 MG capsule TAKE 1 CAPSULE BY MOUTH EVERY DAY 90 capsule 1  . ONETOUCH DELICA LANCETS FINE MISC CHECK BLOOD GLUCOSE TWICE A DAY 100 each 3   No current facility-administered medications on file prior to visit.     Social History   Tobacco Use  . Smoking status: Former Smoker  Packs/day: 1.00    Years: 20.00    Pack years: 20.00    Types: Cigarettes    Last attempt to quit: 11/17/2012    Years since quitting: 5.2  . Smokeless tobacco: Former Network engineer Use Topics  . Alcohol use: No  . Drug use: No    Review of Systems  Constitutional: Negative for chills, fever and unexpected weight change.  HENT: Negative for dental problem, sore throat and trouble swallowing.   Respiratory: Negative for cough.   Cardiovascular: Negative for chest pain and palpitations.  Gastrointestinal: Negative for abdominal pain, constipation, nausea and vomiting.  Neurological: Positive for numbness (LE. ).      Objective:    BP 110/70 (BP Location: Left Arm, Patient Position:  Sitting, Cuff Size: Large)   Pulse 68   Temp 98.6 F (37 C) (Oral)   Resp 15   Wt 249 lb (112.9 kg)   SpO2 98%   BMI 37.86 kg/m  BP Readings from Last 3 Encounters:  02/05/18 110/70  01/24/18 126/80  01/08/18 124/84   Wt Readings from Last 3 Encounters:  02/05/18 249 lb (112.9 kg)  01/24/18 247 lb 2 oz (112.1 kg)  12/28/17 248 lb 4 oz (112.6 kg)    Physical Exam  Constitutional: She appears well-developed and well-nourished.  HENT:  Mouth/Throat: No oral lesions. No trismus in the jaw. No dental abscesses, uvula swelling, lacerations or dental caries. No posterior oropharyngeal erythema.  Eyes: Conjunctivae are normal.  Cardiovascular: Normal rate, regular rhythm, normal heart sounds and normal pulses.  Pulmonary/Chest: Effort normal and breath sounds normal. She has no wheezes. She has no rhonchi. She has no rales.  Neurological: She is alert.  Skin: Skin is warm and dry.  Psychiatric: She has a normal mood and affect. Her speech is normal and behavior is normal. Thought content normal.  Vitals reviewed.      Assessment & Plan:   Problem List Items Addressed This Visit      Cardiovascular and Mediastinum   Hypertension - Primary    At goal.  Continue current regimen.      Relevant Medications   losartan-hydrochlorothiazide (HYZAAR) 50-12.5 MG tablet   amLODipine (NORVASC) 10 MG tablet   metoprolol tartrate (LOPRESSOR) 25 MG tablet   Other Relevant Orders   Basic metabolic panel     Endocrine   Type 2 diabetes mellitus with diabetic neuropathy, unspecified ([place])    Appears controlled.  Pending A1c.  Patient able to appreciate microfilament test.  Will continue gabapentin.  Education provided on preventing worsening neuropathy.      Relevant Medications   losartan-hydrochlorothiazide (HYZAAR) 50-12.5 MG tablet   Other Relevant Orders   Hemoglobin A1c     Other   Neuroendocrine carcinoma of colon ([place])    Will follow. Reviewed Dr Collie Siad note. Pending PET.         Mouth sore    Benign exam.  No appreciated lesions.  Patient symptom resolved with Magic mouthwash, appropriate to give 1 more dose of this.  If persistent or recurs, patient understands let me know as he may need to consult dentist, ENT.      Relevant Medications   magic mouthwash w/lidocaine SOLN       I have changed Indiya A. Bonelli's losartan-hydrochlorothiazide. I am also having her start on magic mouthwash w/lidocaine. Additionally, I am having her maintain her Calcium Carbonate-Vitamin D, aspirin, ONETOUCH DELICA LANCETS FINE, levothyroxine, meloxicam, glucose blood, gabapentin, omeprazole, metFORMIN, atorvastatin, amLODipine, and metoprolol  tartrate.   Meds ordered this encounter  Medications  . losartan-hydrochlorothiazide (HYZAAR) 50-12.5 MG tablet    Sig: Take 1 tablet by mouth daily.    Dispense:  90 tablet    Refill:  1  . amLODipine (NORVASC) 10 MG tablet    Sig: TAKE 1 TABLET (10 MG TOTAL) BY MOUTH DAILY.    Dispense:  90 tablet    Refill:  3  . metoprolol tartrate (LOPRESSOR) 25 MG tablet    Sig: Take 1 tablet (25 mg total) by mouth 2 (two) times daily.    Dispense:  90 tablet    Refill:  1  . magic mouthwash w/lidocaine SOLN    Sig: Take 5 mLs by mouth 3 (three) times daily as needed for up to 10 days for mouth pain.    Dispense:  50 mL    Refill:  0    Order Specific Question:   Supervising Provider    Answer:   Crecencio Mc [[id number]]    Return precautions given.   Risks, benefits, and alternatives of the medications and treatment plan prescribed today were discussed, and patient expressed understanding.   Education regarding symptom management and diagnosis given to patient on AVS.  Continue to follow with Burnard Hawthorne, FNP for routine health maintenance.   Cynthia [name] and I agreed with plan.   Mable Paris, FNP

## 2018-02-06 ENCOUNTER — Telehealth: Payer: Self-pay | Admitting: Family

## 2018-02-06 NOTE — Telephone Encounter (Signed)
Copied from [place] [id number]. Topic: Quick Communication - Lab Results >> Feb 05, 2018  4:46 PM Bare, Patriciaann Clan, CMA wrote: Called patient to inform them of  lab results. When patient returns call, triage nurse  may disclose results.

## 2018-02-06 NOTE — Telephone Encounter (Signed)
Charted in result notes. 

## 2018-02-06 NOTE — Telephone Encounter (Signed)
Patient returning call.

## 2018-02-09 ENCOUNTER — Encounter
Admission: RE | Admit: 2018-02-09 | Discharge: 2018-02-09 | Disposition: A | Discharge status: Home / Self Care | Payer: Medicare Other | Source: Ambulatory Visit | Attending: Oncology | Admitting: Oncology

## 2018-02-09 DIAGNOSIS — C7A8 Other malignant neuroendocrine tumors: Secondary | ICD-10-CM | POA: Diagnosis present

## 2018-02-09 IMAGING — CT NM PET NOPR SKULL BASE TO THIGH
9 series · 25 of 25 positions shown · non-contrast
Comparison: None.

CLINICAL DATA: Neuroendocrine tumor of the colon. Additional
history of lung cancer. Neuroendocrine tumor identified in 1 of 5
sigmoid polyps. 2.4 mm lesion. Well differentiated neuroendocrine
tumor (grade 1)

EXAM:
NUCLEAR MEDICINE PET SKULL BASE TO THIGH
TECHNIQUE: 5.4 mCi Ga 68 DOTATATE was injected intravenously. Full-ring PET
imaging was performed from the skull base to thigh after the
radiotracer. CT data was obtained and used for attenuation
correction and anatomic localization.

[Series 3: ct wb 5.0 b30f · axial · 5.0mm · 0.98mm/px · z∈[-1395,-528]mm · 3 of 290 slices shown]
[im 1/290]
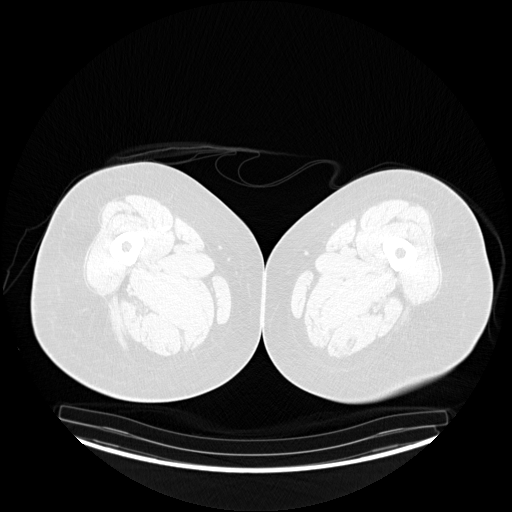
[im 145/290]
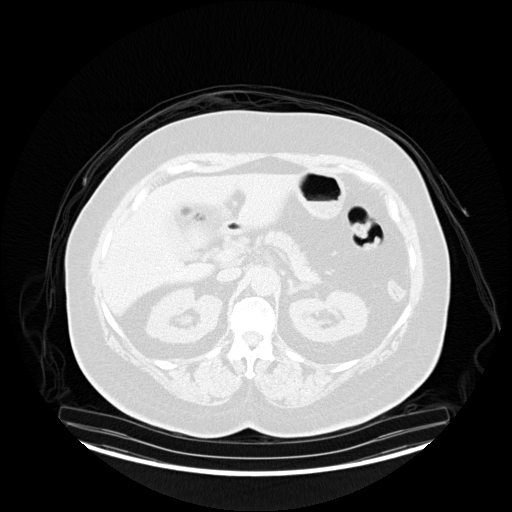
[im 290/290  brain]
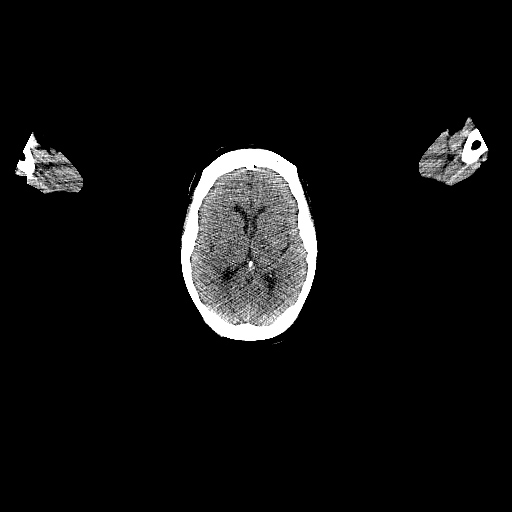

[Series 4: pet wb (ac) · axial · 5.0mm · 3.39mm/px · z∈[-1395,-528]mm · 4 of 290 slices shown]
[im 1/290]
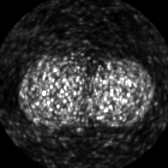
[im 97/290]
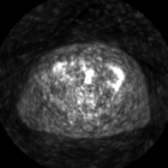
[im 193/290]
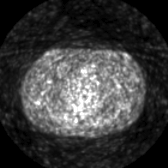
[im 290/290]
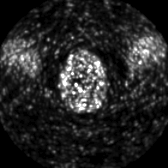

[Series 5: pet wb uncorrected (nac) · axial · 5.0mm · 4.07mm/px · z∈[-1395,-528]mm · 4 of 290 slices shown]
[im 1/290]
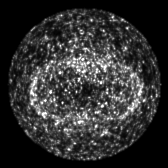
[im 97/290]
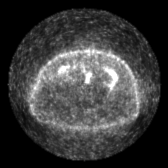
[im 193/290]
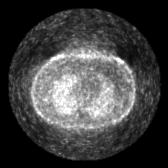
[im 290/290]
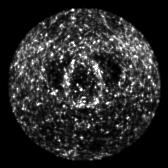

[Series 603: pet_ct axial fused · 4 of 289 slices shown]
[im 1/289]
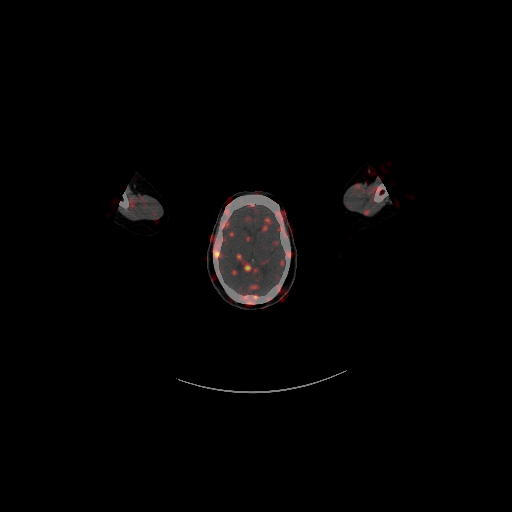
[im 97/289]
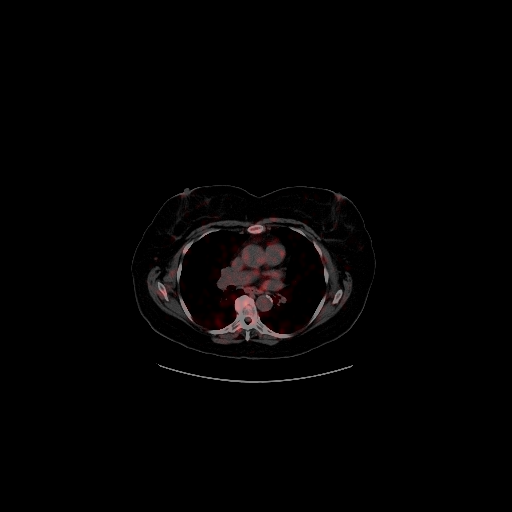
[im 193/289]
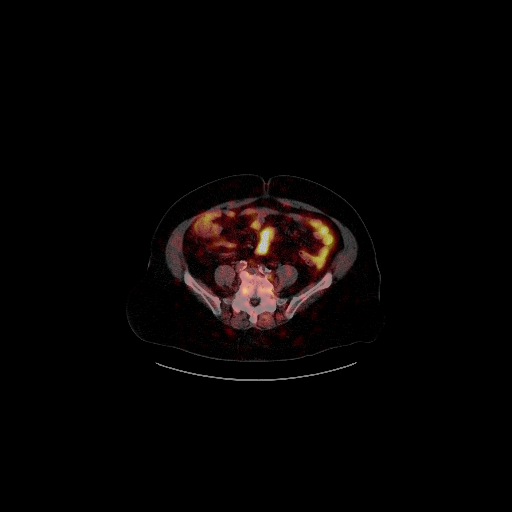
[im 289/289]
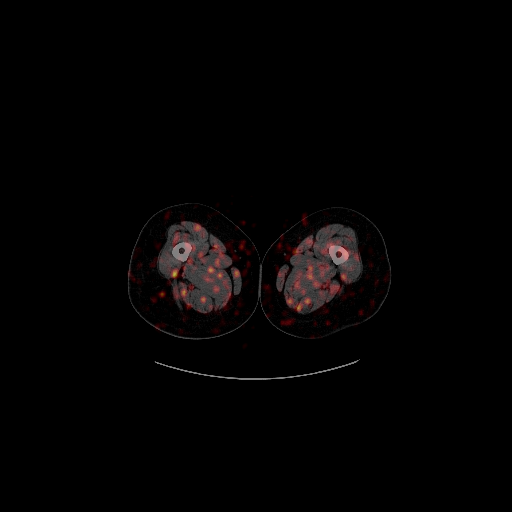

[Series 604: pet_ct coronal fused · 1 of 103 slices shown]
[im 1/103]
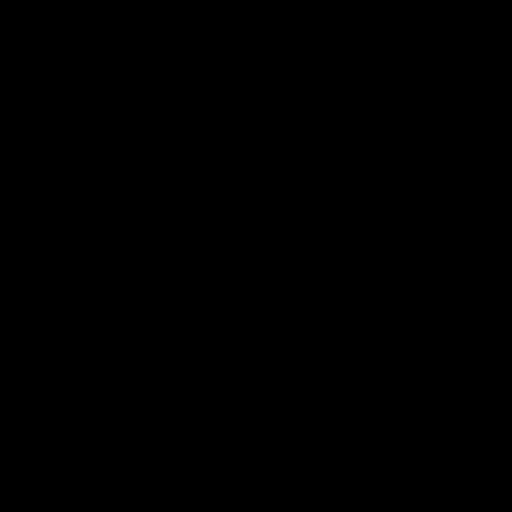

[Series 605: pet_ct sagittal fused · 2 of 162 slices shown]
[im 1/162]
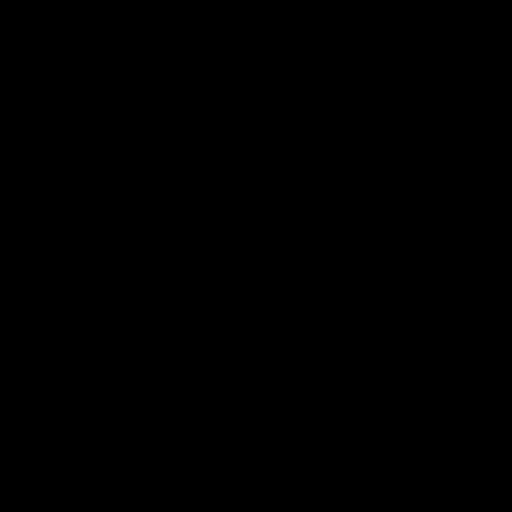
[im 162/162]
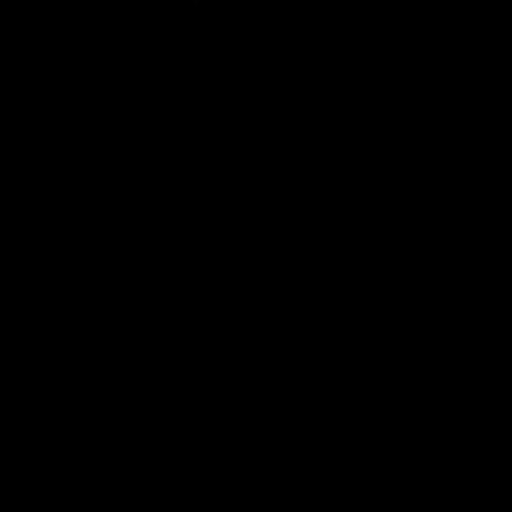

[Series 606: pet axial · 4 of 288 slices shown]
[im 1/288]
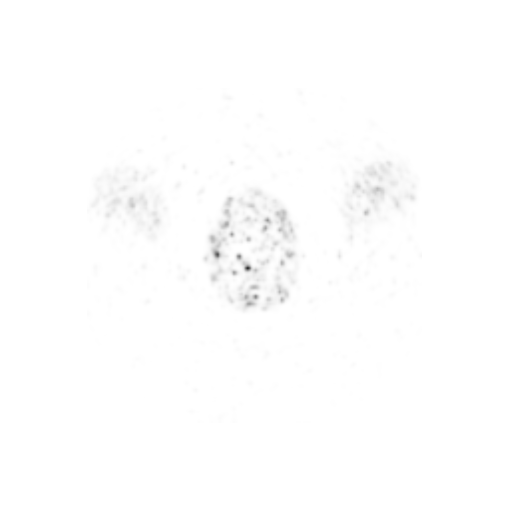
[im 96/288]
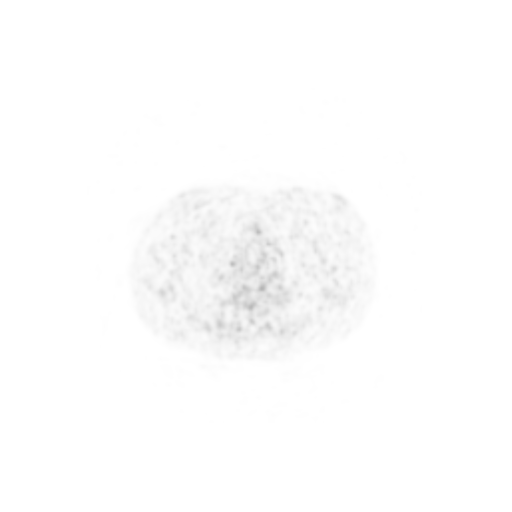
[im 192/288]
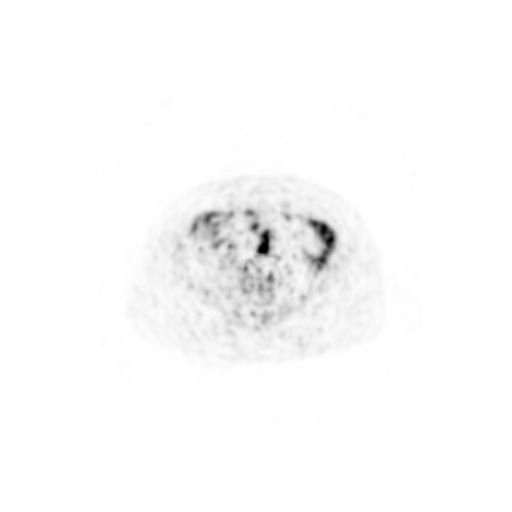
[im 288/288]
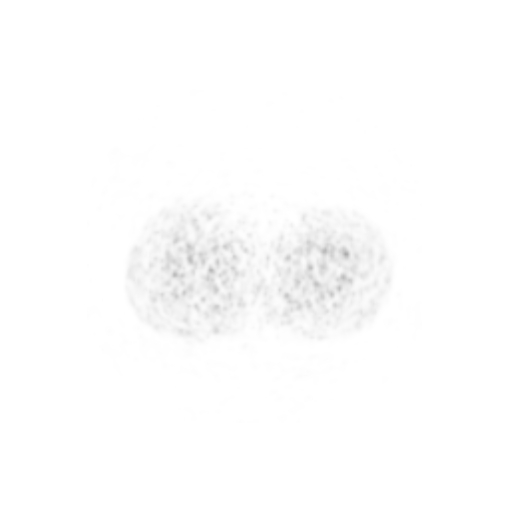

[Series 607: pet coronal · 1 of 121 slices shown]
[im 1/121]
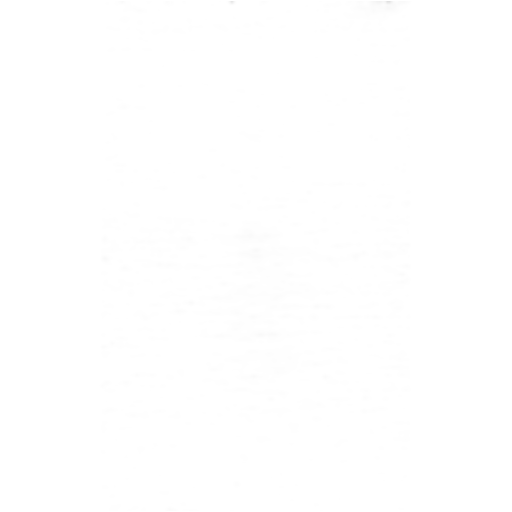

[Series 608: pet sagittal · 2 of 185 slices shown]
[im 1/185]
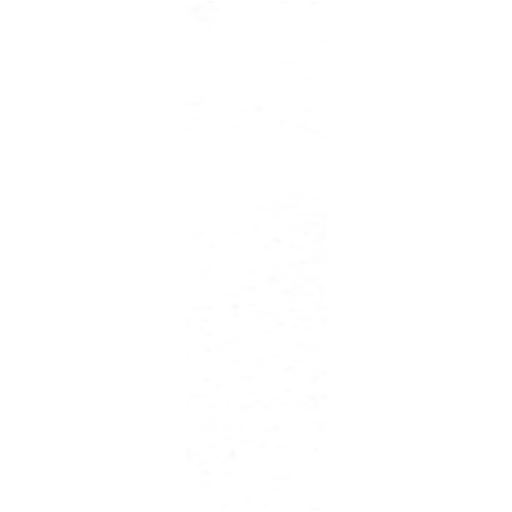
[im 185/185]
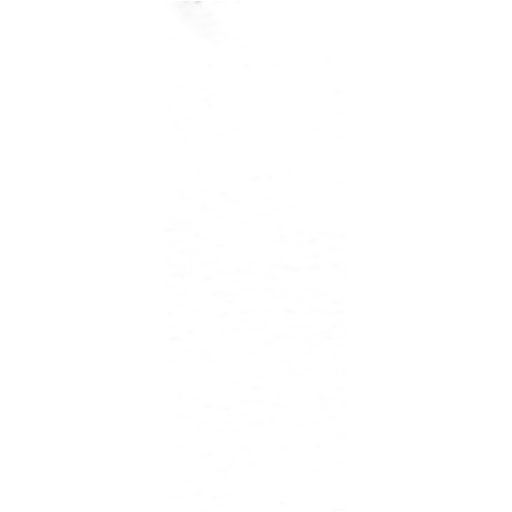

[25 of 25 positions shown; findings below may reference images not displayed]

FINDINGS: NECK

No radiotracer activity in neck lymph nodes.

Incidental CT findings: None

CHEST

No radiotracer accumulation within mediastinal or hilar lymph nodes.
Pulmonary nodule in the medial aspect of the LEFT upper lobe
measures 9 mm (image 72/3). This nodule has no radiotracer
accumulation. Additionally nodule is not changed in size from CT
[DATE] at which time described as benign nodule.

Incidental CT finding:Coronary artery calcification and aortic
atherosclerotic calcification.

ABDOMEN/PELVIS

There is no focal radiotracer accumulation within the sigmoid colon
colon. Diffuse physiologic activity within the small bowel. No
abnormal activity within abdominopelvic lymph nodes. No abnormal
activity within the liver.

Physiologic activity noted in the adrenal glands, pancreas, spleen.

Incidental CT findings:Uterus normal. Simple fluid attenuation
lesions in the kidneys are likely benign cysts.

SKELETON

No focal activity to suggest skeletal metastasis.

Incidental CT findings:None
IMPRESSION: 1. No evidence neuroendocrine tumor tumor metastasis in the abdomen
pelvis.
2. No evidence of primary lesion in the sigmoid colon or elsewhere
in the colon.
3. Stable LEFT upper lobe pulmonary nodule over multiple comparison
exams.
4.  Aortic Atherosclerosis ([LN]-[LN]).

## 2018-02-09 MED ORDER — GALLIUM GA 68 DOTATATE IV KIT
5.3700 | PACK | Freq: Once | INTRAVENOUS | Status: AC
  Administered 2018-02-09: 5.37 via INTRAVENOUS

## 2018-02-14 ENCOUNTER — Inpatient Hospital Stay (HOSPITAL_BASED_OUTPATIENT_CLINIC_OR_DEPARTMENT_OTHER): Payer: Medicare Other | Admitting: Oncology

## 2018-02-14 ENCOUNTER — Other Ambulatory Visit: Payer: Self-pay

## 2018-02-14 ENCOUNTER — Encounter: Payer: Self-pay | Admitting: Oncology

## 2018-02-14 VITALS — BP 123/74 | HR 90 | Temp 97.4°F | Resp 18 | Wt 246.9 lb

## 2018-02-14 DIAGNOSIS — E119 Type 2 diabetes mellitus without complications: Secondary | ICD-10-CM | POA: Diagnosis not present

## 2018-02-14 DIAGNOSIS — C7A8 Other malignant neuroendocrine tumors: Secondary | ICD-10-CM

## 2018-02-14 DIAGNOSIS — Z79899 Other long term (current) drug therapy: Secondary | ICD-10-CM

## 2018-02-14 DIAGNOSIS — Z7982 Long term (current) use of aspirin: Secondary | ICD-10-CM

## 2018-02-14 DIAGNOSIS — Z87891 Personal history of nicotine dependence: Secondary | ICD-10-CM

## 2018-02-14 DIAGNOSIS — D3A8 Other benign neuroendocrine tumors: Secondary | ICD-10-CM | POA: Diagnosis not present

## 2018-02-14 DIAGNOSIS — Z8249 Family history of ischemic heart disease and other diseases of the circulatory system: Secondary | ICD-10-CM | POA: Diagnosis not present

## 2018-02-14 DIAGNOSIS — I10 Essential (primary) hypertension: Secondary | ICD-10-CM | POA: Diagnosis not present

## 2018-02-14 DIAGNOSIS — Z7984 Long term (current) use of oral hypoglycemic drugs: Secondary | ICD-10-CM

## 2018-02-14 NOTE — Progress Notes (Signed)
Patient here today for follow up.   

## 2018-02-14 NOTE — Progress Notes (Signed)
Hematology/Oncology follow up note Cynthia [name] [phone] [phone]   Patient Care Team: Burnard Hawthorne, FNP as PCP - General (Family Medicine) Jackolyn Confer, MD (Internal Medicine) Clent Jacks, RN as Registered Nurse  REFERRING PROVIDER: Gastroenterology: [facility] Form. REASON FOR VISIT Follow up for management of neuroendocrine cancer of the colon and discussion about dotatate PET scan results.  HISTORY OF PRESENTING ILLNESS:  Cynthia [name] is a  72 y.o.  female with PMH listed below who was referred to me for evaluation of neuroendocrine cancer  She recently have screening colonoscopy done and was found to have 3 ascending colon polyps and 6 sigmoid colon polyps, all removed and retrieved. All three ascending colon polyps were tubular adenoma. 5 sigmoid colon polyps were hyperplastic polyps. One polyp turned out to be well defferentiated neuroendocrine tumor, 2.53mm, grade 1.   INTERVAL HISTORY Cynthia [name] is a 72 y.o. female who has above history reviewed by me today presents for follow up visit for management of neuroendocrine cancer of colon.  She presents to discuss about image results.  No new complaints. Denies any diarrhea, flushes, abdominal pain, blood in stool.  Review of Systems  Constitutional: Negative for chills, fever, malaise/fatigue and weight loss.  HENT: Negative for congestion, ear discharge, ear pain, nosebleeds, sinus pain and sore throat.   Eyes: Negative for double vision, photophobia, pain, discharge and redness.  Respiratory: Negative for cough, hemoptysis, sputum production, shortness of breath and wheezing.   Cardiovascular: Negative for chest pain, palpitations, orthopnea, claudication and leg swelling.  Gastrointestinal: Negative for abdominal pain, blood in stool, constipation, diarrhea, heartburn, melena, nausea and vomiting.  Genitourinary: Negative for dysuria, flank pain, frequency and  hematuria.  Musculoskeletal: Negative for back pain, myalgias and neck pain.  Skin: Negative for itching and rash.  Neurological: Negative for dizziness, tingling, tremors, focal weakness, weakness and headaches.  Endo/Heme/Allergies: Negative for environmental allergies. Does not bruise/bleed easily.  Psychiatric/Behavioral: Negative for depression and hallucinations. The patient is not nervous/anxious.     MEDICAL HISTORY:  Past Medical History:  Diagnosis Date  . Arthritis   . Coronary artery disease   . Diabetes mellitus without complication ([place])   . Hyperlipidemia   . Hypertension   . Lung nodule   . Thyroid disease     SURGICAL HISTORY: Past Surgical History:  Procedure Laterality Date  . BREAST BIOPSY Left 11/25/2015   stereo  neg  . BREAST EXCISIONAL BIOPSY Left yrs ago   benign  . COLONOSCOPY WITH PROPOFOL N/A 04/20/2017   Procedure: COLONOSCOPY WITH PROPOFOL;  Surgeon: [name], MD;  Location: [facility] ENDOSCOPY;  Service: Endoscopy;  Laterality: N/A;  . COLONOSCOPY WITH PROPOFOL N/A 01/08/2018   Procedure: COLONOSCOPY WITH PROPOFOL;  Surgeon: [name], MD;  Location: [facility] ENDOSCOPY;  Service: Gastroenterology;  Laterality: N/A;  . THYROIDECTOMY     Dr. [name]  . VAGINAL DELIVERY     4    SOCIAL HISTORY: Social History   Socioeconomic History  . Marital status: Married    Spouse name: Not on file  . Number of children: 4  . Years of education: Not on file  . Highest education level: Not on file  Occupational History  . Not on file  Social Needs  . Financial resource strain: Not on file  . Food insecurity:    Worry: Not on file    Inability: Not on file  . Transportation needs:    Medical: Not on file  Non-medical: Not on file  Tobacco Use  . Smoking status: Former Smoker    Packs/day: 1.00    Years: 20.00    Pack years: 20.00    Types: Cigarettes    Last attempt to quit: 11/17/2012    Years since quitting: 5.2  . Smokeless tobacco: Former Chief Strategy Officer and Sexual Activity  . Alcohol use: No  . Drug use: No  . Sexual activity: Never  Lifestyle  . Physical activity:    Days per week: Not on file    Minutes per session: Not on file  . Stress: Not on file  Relationships  . Social connections:    Talks on phone: Not on file    Gets together: Not on file    Attends religious service: Not on file    Active member of club or organization: Not on file    Attends meetings of clubs or organizations: Not on file    Relationship status: Not on file  . Intimate partner violence:    Fear of current or ex partner: Not on file    Emotionally abused: Not on file    Physically abused: Not on file    Forced sexual activity: Not on file  Other Topics Concern  . Not on file  Social History Narrative   Lives in [place] with husband. Has 4 children.      4 grandchildren.      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 Hypertension Mother   . Hypertension Father   . Diabetes Sister   . Thyroid disease Sister   . Breast cancer Maternal Aunt   . Breast cancer Maternal Aunt     ALLERGIES:  has No Known Allergies.  MEDICATIONS:  Current Outpatient Medications  Medication Sig Dispense Refill  . amLODipine (NORVASC) 10 MG tablet TAKE 1 TABLET (10 MG TOTAL) BY MOUTH DAILY. 90 tablet 3  . aspirin 81 MG tablet Take 81 mg by mouth daily.    Marland Kitchen atorvastatin (LIPITOR) 20 MG tablet TAKE 1 TABLET (20 MG TOTAL) BY MOUTH DAILY. 90 tablet 0  . Calcium Carbonate-Vitamin D 600-400 MG-UNIT per tablet Take 1 tablet by mouth daily.    Marland Kitchen gabapentin (NEURONTIN) 100 MG capsule TAKE ONE CAPSULE BY MOUTH 3 TIMES A DAY 90 capsule 6  . glucose blood (ONE TOUCH ULTRA TEST) test strip TEST BLOOD SUGAR 1-2 TIMES A DAY 100 each 4  . levothyroxine (SYNTHROID, LEVOTHROID) 137 MCG tablet TAKE 1 TABLET (137 MCG TOTAL) BY MOUTH  DAILY BEFORE BREAKFAST. 90 tablet 1  . losartan-hydrochlorothiazide (HYZAAR) 50-12.5 MG tablet Take 1 tablet by mouth daily. 90 tablet 1  . meloxicam (MOBIC) 15 MG tablet Take 1 tablet (15 mg total) by mouth daily. 90 tablet 1  . metFORMIN (GLUCOPHAGE) 500 MG tablet TAKE 1 TABLET (500 MG TOTAL) BY MOUTH 2 (TWO) TIMES DAILY WITH A MEAL. 180 tablet 1  . metoprolol tartrate (LOPRESSOR) 25 MG tablet Take 1 tablet (25 mg total) by mouth 2 (two) times daily. 90 tablet 1  . omeprazole (PRILOSEC) 20 MG capsule TAKE 1 CAPSULE BY MOUTH EVERY DAY 90 capsule 1  . ONETOUCH DELICA LANCETS FINE MISC CHECK BLOOD GLUCOSE TWICE A DAY 100 each 3  . magic mouthwash w/lidocaine SOLN Take 5 mLs by  mouth 3 (three) times daily as needed for up to 10 days for mouth pain. (Patient not taking: Reported on 02/14/2018) 50 mL 0   No current facility-administered medications for this visit.      PHYSICAL EXAMINATION: ECOG PERFORMANCE STATUS: 0 - Asymptomatic Vitals:   02/14/18 1447  BP: 123/74  Pulse: 90  Resp: 18  Temp: (!) 97.4 F (36.3 C)   Filed Weights   02/14/18 1447  Weight: 246 lb 14.4 oz (112 kg)    Physical Exam  Constitutional: She is oriented to person, place, and time. She appears well-developed and well-nourished. No distress.  HENT:  Head: Normocephalic and atraumatic.  Right Ear: External ear normal.  Left Ear: External ear normal.  Mouth/Throat: Oropharynx is clear and moist.  Eyes: Pupils are equal, round, and reactive to light. Conjunctivae and EOM are normal. No scleral icterus.  Neck: Normal range of motion. Neck supple.  Cardiovascular: Normal rate, regular rhythm and normal heart sounds.  Pulmonary/Chest: Effort normal and breath sounds normal. No respiratory distress. She has no wheezes. She has no rales. She exhibits no tenderness.  Abdominal: Soft. Bowel sounds are normal. She exhibits no distension and no mass. There is no tenderness.  Musculoskeletal: Normal range of motion. She  exhibits no edema or deformity.  Lymphadenopathy:    She has no cervical adenopathy.  Neurological: She is alert and oriented to person, place, and time. No cranial nerve deficit. Coordination normal.  Skin: Skin is warm and dry. No rash noted.  Psychiatric: She has a normal mood and affect. Her behavior is normal. Thought content normal.     LABORATORY DATA:  I have reviewed the data as listed Lab Results  Component Value Date   WBC 4.4 01/23/2018   HGB 14.3 01/23/2018   HCT 41.6 01/23/2018   MCV 92.9 01/23/2018   PLT 272 01/23/2018   Recent Labs    02/15/17 1417  11/06/17 0936 01/23/18 1155 02/05/18 0940  NA 138   < > 138 138 137  K 3.8   < > 4.0 3.7 3.6  CL 101   < > 102 102 101  CO2 30   < > 26 25 28   GLUCOSE 113*   < > 128* 86 179*  BUN 15   < > 15 12 12   CREATININE 0.79   < > 0.75 0.75 0.75  CALCIUM 10.1   < > 9.6 9.9 9.4  GFRNONAA  --   --   --  >60  --   GFRAA  --   --   --  >60  --   PROT 7.5  --   --  7.7  --   ALBUMIN 4.4  --   --  4.2  --   AST 22  --   --  34  --   ALT 23  --   --  27  --   ALKPHOS 64  --   --  63  --   BILITOT 0.5  --   --  0.7  --    < > = values in this interval not displayed.    Serum serotonin level at 111 within normal range 5 HIAA urine test was obtained and patient has not gotten done yet.   ASSESSMENT & PLAN:  1. Neuroendocrine carcinoma of colon [facility])    #Dotatate PET scan showed no evidence of neuroendocrine tumor metastatic lesions. Patient has a stable left upper lobe pulmonary nodule, 59mm, no radiotracer accumulation.. Results discussed  with patient in details.   Advised patient to obtain 5 HIAA urine test   #Recommend patient to follow up with Dr. Vicente Males for colonoscopy in 6 months.   All questions were answered. The patient knows to call the clinic with any problems questions or concerns.  Return of visit: 6 months.  With repeat serum biomarkers. Thank you for this kind referral and the opportunity to participate  in the care of this patient. A copy of today's note is routed to referring provider    Earlie Server, MD, PhD Hematology Oncology [name] Pager- [phone] 02/14/2018

## 2018-02-15 DIAGNOSIS — D3A8 Other benign neuroendocrine tumors: Secondary | ICD-10-CM | POA: Diagnosis not present

## 2018-02-16 ENCOUNTER — Other Ambulatory Visit: Payer: Self-pay

## 2018-02-16 DIAGNOSIS — C7A8 Other malignant neuroendocrine tumors: Secondary | ICD-10-CM

## 2018-02-20 NOTE — Progress Notes (Signed)
Setup recall letter for patient.   Original letter mailed advising patient repeat colonoscopy was recommended.

## 2018-02-21 LAB — 5 HIAA, QUANTITATIVE, URINE, 24 HOUR
5 HIAA UR: 1.6 mg/L
5-HIAA,QUANT.,24 HR URINE: 4.5 mg/(24.h) (ref 0.0–14.9)
TOTAL VOLUME: 2800

## 2018-03-13 ENCOUNTER — Ambulatory Visit (INDEPENDENT_AMBULATORY_CARE_PROVIDER_SITE_OTHER): Payer: Medicare Other | Admitting: Internal Medicine

## 2018-03-13 ENCOUNTER — Encounter: Payer: Self-pay | Admitting: Internal Medicine

## 2018-03-13 VITALS — BP 122/68 | HR 70 | Temp 98.3°F | Ht 68.0 in | Wt 246.0 lb

## 2018-03-13 DIAGNOSIS — M1990 Unspecified osteoarthritis, unspecified site: Secondary | ICD-10-CM

## 2018-03-13 DIAGNOSIS — Z1231 Encounter for screening mammogram for malignant neoplasm of breast: Secondary | ICD-10-CM

## 2018-03-13 DIAGNOSIS — J439 Emphysema, unspecified: Secondary | ICD-10-CM

## 2018-03-13 DIAGNOSIS — R062 Wheezing: Secondary | ICD-10-CM | POA: Insufficient documentation

## 2018-03-13 DIAGNOSIS — I10 Essential (primary) hypertension: Secondary | ICD-10-CM

## 2018-03-13 DIAGNOSIS — J441 Chronic obstructive pulmonary disease with (acute) exacerbation: Secondary | ICD-10-CM | POA: Insufficient documentation

## 2018-03-13 DIAGNOSIS — R053 Chronic cough: Secondary | ICD-10-CM | POA: Insufficient documentation

## 2018-03-13 MED ORDER — ALBUTEROL SULFATE HFA 108 (90 BASE) MCG/ACT IN AERS: 1.0000 | INHALATION_SPRAY | Freq: Four times a day (QID) | RESPIRATORY_TRACT | 2 refills | Status: DC | PRN

## 2018-03-13 MED ORDER — AZITHROMYCIN 250 MG PO TABS: ORAL_TABLET | ORAL | 0 refills | Status: DC

## 2018-03-13 MED ORDER — IPRATROPIUM BROMIDE 0.02 % IN SOLN
0.5000 mg | Freq: Once | RESPIRATORY_TRACT | Status: AC
  Administered 2018-03-13: 0.5 mg via RESPIRATORY_TRACT

## 2018-03-13 MED ORDER — ALBUTEROL SULFATE (2.5 MG/3ML) 0.083% IN NEBU
2.5000 mg | INHALATION_SOLUTION | Freq: Once | RESPIRATORY_TRACT | Status: AC
  Administered 2018-03-13: 2.5 mg via RESPIRATORY_TRACT

## 2018-03-13 MED ORDER — MELOXICAM 15 MG PO TABS: 15.0000 mg | ORAL_TABLET | Freq: Every day | ORAL | 1 refills | Status: DC

## 2018-03-13 NOTE — Patient Instructions (Signed)
F/u as scheduled   Chronic Obstructive Pulmonary Disease Chronic obstructive pulmonary disease (COPD) is a long-term (chronic) lung problem. When you have COPD, it is hard for air to get in and out of your lungs. The way your lungs work will never return to normal. Usually the condition gets worse over time. There are things you can do to keep yourself as healthy as possible. Your doctor may treat your condition with:  Medicines.  Quitting smoking, if you smoke.  Rehabilitation. This may involve a team of specialists.  Oxygen.  Exercise and changes to your diet.  Lung surgery.  Comfort measures (palliative care).  Follow these instructions at home: Medicines  Take over-the-counter and prescription medicines only as told by your doctor.  Talk to your doctor before taking any cough or allergy medicines. You may need to avoid medicines that cause your lungs to be dry. Lifestyle  If you smoke, stop. Smoking makes the problem worse. If you need help quitting, ask your doctor.  Avoid being around things that make your breathing worse. This may include smoke, chemicals, and fumes.  Stay active, but remember to also rest.  Learn and use tips on how to relax.  Make sure you get enough sleep. Most adults need at least 7 hours a night.  Eat healthy foods. Eat smaller meals more often. Rest before meals. Controlled breathing  Learn and use tips on how to control your breathing as told by your doctor. Try: ? Breathing in (inhaling) through your nose for 1 second. Then, pucker your lips and breath out (exhale) through your lips for 2 seconds. ? Putting one hand on your belly (abdomen). Breathe in slowly through your nose for 1 second. Your hand on your belly should move out. Pucker your lips and breathe out slowly through your lips. Your hand on your belly should move in as you breathe out. Controlled coughing  Learn and use controlled coughing to clear mucus from your lungs. The steps  are: 1. Lean your head a little forward. 2. Breathe in deeply. 3. Try to hold your breath for 3 seconds. 4. Keep your mouth slightly open while coughing 2 times. 5. Spit any mucus out into a tissue. 6. Rest and do the steps again 1 or 2 times as needed. General instructions  Make sure you get all the shots (vaccines) that your doctor recommends. Ask your doctor about a flu shot and a pneumonia shot.  Use oxygen therapy and therapy to help improve your lungs (pulmonary rehabilitation) if told by your doctor. If you need home oxygen therapy, ask your doctor if you should buy a tool to measure your oxygen level (oximeter).  Make a COPD action plan with your doctor. This helps you know what to do if you feel worse than usual.  Manage any other conditions you have as told by your doctor.  Avoid going outside when it is very hot, cold, or humid.  Avoid people who have a sickness you can catch (contagious).  Keep all follow-up visits as told by your doctor. This is important. Contact a doctor if:  You cough up more mucus than usual.  There is a change in the color or thickness of the mucus.  It is harder to breathe than usual.  Your breathing is faster than usual.  You have trouble sleeping.  You need to use your medicines more often than usual.  You have trouble doing your normal activities such as getting dressed or walking around the house.  Get help right away if:  You have shortness of breath while resting.  You have shortness of breath that stops you from: ? Being able to talk. ? Doing normal activities.  Your chest hurts for longer than 5 minutes.  Your skin color is more blue than usual.  Your pulse oximeter shows that you have low oxygen for longer than 5 minutes.  You have a fever.  You feel too tired to breathe normally. Summary  Chronic obstructive pulmonary disease (COPD) is a long-term lung problem.  The way your lungs work will never return to normal.  Usually the condition gets worse over time. There are things you can do to keep yourself as healthy as possible.  Take over-the-counter and prescription medicines only as told by your doctor.  If you smoke, stop. Smoking makes the problem worse. This information is not intended to replace advice given to you by your health care provider. Make sure you discuss any questions you have with your health care provider. Document Released: 02/22/2008 Document Revised: 02/11/2016 Document Reviewed: 05/02/2013 Elsevier Interactive Patient Education  2017 [employer].

## 2018-03-13 NOTE — Progress Notes (Signed)
Chief Complaint  Patient presents with  . Bronchitis   Sick  Cough since Friday with sputum yellow tried robitussin and mucinex which helped. Former smoker GD was sick 3-4 weeks ago no fever CT chest 10/10/16 mild emphysema   Needs refill of mobic  Wants referral mammogram   Review of Systems  Constitutional: Negative for fever.  HENT: Positive for congestion.   Eyes: Negative for blurred vision and double vision.  Respiratory: Positive for cough and sputum production.   Cardiovascular: Negative for chest pain.  Musculoskeletal: Positive for joint pain.  Skin: Negative for rash.   Past Medical History:  Diagnosis Date  . Arthritis   . COPD (chronic obstructive pulmonary disease) ([place])   . Coronary artery disease   . Diabetes mellitus without complication ([place])   . Hyperlipidemia   . Hypertension   . Lung nodule   . Thyroid disease    Past Surgical History:  Procedure Laterality Date  . BREAST BIOPSY Left 11/25/2015   stereo  neg  . BREAST EXCISIONAL BIOPSY Left yrs ago   benign  . COLONOSCOPY WITH PROPOFOL N/A 04/20/2017   Procedure: COLONOSCOPY WITH PROPOFOL;  Surgeon: [name], MD;  Location[facility] ENDOSCOPY;  Service: Endoscopy;  Laterality: N/A;  . COLONOSCOPY WITH PROPOFOL N/A 01/08/2018   Procedure: COLONOSCOPY WITH PROPOFOL;  Surgeon: [name], MD;  Location: [facility] ENDOSCOPY;  Service: Gastroenterology;  Laterality: N/A;  . THYROIDECTOMY     Dr. [name]  . VAGINAL DELIVERY     4   Family History  Problem Relation Age of Onset  . Hypertension Mother   . Hypertension Father   . Diabetes Sister   . Thyroid disease Sister   . Breast cancer Maternal Aunt   . Breast cancer Maternal Aunt    Social History   Socioeconomic History  . Marital status: Married    Spouse name: Not on file  . Number of children: 4  . Years of education: Not on file  . Highest education level: Not on file  Occupational History  . Not on file  Social Needs  . Financial resource  strain: Not on file  . Food insecurity:    Worry: Not on file    Inability: Not on file  . Transportation needs:    Medical: Not on file    Non-medical: Not on file  Tobacco Use  . Smoking status: Former Smoker    Packs/day: 1.00    Years: 20.00    Pack years: 20.00    Types: Cigarettes    Last attempt to quit: 11/17/2012    Years since quitting: 5.3  . Smokeless tobacco: Former Network engineer and Sexual Activity  . Alcohol use: No  . Drug use: No  . Sexual activity: Never  Lifestyle  . Physical activity:    Days per week: Not on file    Minutes per session: Not on file  . Stress: Not on file  Relationships  . Social connections:    Talks on phone: Not on file    Gets together: Not on file    Attends religious service: Not on file    Active member of club or organization: Not on file    Attends meetings of clubs or organizations: Not on file    Relationship status: Not on file  . Intimate partner violence:    Fear of current or ex partner: Not on file    Emotionally abused: Not on file    Physically abused: Not on  file    Forced sexual activity: Not on file  Other Topics Concern  . Not on file  Social History Narrative   Lives in [place] with husband. Has 4 children.      4 grandchildren.      Work - Retired from [employer]- walking the track, 4x per week      Diet- regular      Current Meds  Medication Sig  . amLODipine (NORVASC) 10 MG tablet TAKE 1 TABLET (10 MG TOTAL) BY MOUTH DAILY.  Marland Kitchen aspirin 81 MG tablet Take 81 mg by mouth daily.  Marland Kitchen atorvastatin (LIPITOR) 20 MG tablet TAKE 1 TABLET (20 MG TOTAL) BY MOUTH DAILY.  . Calcium Carbonate-Vitamin D 600-400 MG-UNIT per tablet Take 1 tablet by mouth daily.  Marland Kitchen gabapentin (NEURONTIN) 100 MG capsule TAKE ONE CAPSULE BY MOUTH 3 TIMES A DAY  . glucose blood (ONE TOUCH ULTRA TEST) test strip TEST BLOOD SUGAR 1-2 TIMES A DAY  . levothyroxine (SYNTHROID, LEVOTHROID) 137 MCG tablet TAKE 1 TABLET (137  MCG TOTAL) BY MOUTH DAILY BEFORE BREAKFAST.  Marland Kitchen losartan-hydrochlorothiazide (HYZAAR) 50-12.5 MG tablet Take 1 tablet by mouth daily.  . meloxicam (MOBIC) 15 MG tablet Take 1 tablet (15 mg total) by mouth daily. As needed  . metFORMIN (GLUCOPHAGE) 500 MG tablet TAKE 1 TABLET (500 MG TOTAL) BY MOUTH 2 (TWO) TIMES DAILY WITH A MEAL.  . metoprolol tartrate (LOPRESSOR) 25 MG tablet Take 1 tablet (25 mg total) by mouth 2 (two) times daily.  Marland Kitchen omeprazole (PRILOSEC) 20 MG capsule TAKE 1 CAPSULE BY MOUTH EVERY DAY  . ONETOUCH DELICA LANCETS FINE MISC CHECK BLOOD GLUCOSE TWICE A DAY  . [DISCONTINUED] meloxicam (MOBIC) 15 MG tablet Take 1 tablet (15 mg total) by mouth daily.   No Known Allergies Recent Results (from the past 2160 hour(s))  Glucose, capillary     Status: Abnormal   Collection Time: 01/08/18  8:25 AM  Result Value Ref Range   Glucose-Capillary 111 (H) 65 - 99 mg/dL  Surgical pathology     Status: None   Collection Time: 01/08/18  9:01 AM  Result Value Ref Range   SURGICAL PATHOLOGY      Surgical Pathology CASE: [id number] PATIENT: Cynthia [name] Surgical Pathology Report     SPECIMEN SUBMITTED: A. Colon polyps x3, ascending; cold snare B. Colon polyps x6, sigmoid; cold snare  CLINICAL HISTORY: None provided  PRE-OPERATIVE DIAGNOSIS: Multiple adenomatous polyps  POST-OPERATIVE DIAGNOSIS: Colon polyps     DIAGNOSIS: A. COLON POLYP X3, ASCENDING; COLD SNARE: - TUBULAR ADENOMA (1). - HYPERPLASTIC POLYP (2). - NEGATIVE FOR HIGH-GRADE DYSPLASIA AND MALIGNANCY.  B.  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 - HYPERPLASTIC POLYP (5).  Comment: A limited panel of immunohistochemical stains was performed. Synaptophysin and chromogranin highlight the neuroendocrine lesional cells. Ki-67 labels <2% of the lesional cells. The pattern of immunohistochemical staining supports the above diagnosis. The  well differentiated tumor extends to the base of the biopsy fragment.  IHC slides we re prepared by [facility], [place], [place]. All controls stained appropriately.  This test was developed and its performance characteristics determined by [facility]. It has not been cleared or approved by the [place] Food and Drug Administration. The FDA does not require this test to go through premarket FDA review. This test is used for clinical purposes. It should not be regarded as investigational or for research.  This laboratory is certified under the Clinical Laboratory Improvement Amendments (CLIA) as qualified to perform high complexity clinical laboratory testing.    GROSS DESCRIPTION: A. Labeled: Cold snare polyps x3 ascending colon Received: In formalin Tissue fragment(s): Multiple Size: Aggregate, 2.0 x 0.9 x 0.1 cm Description: Tan fragments and yellow fecal material Entirely submitted in one cassette.  B. Labeled: Cold snare polyps x6 sigmoid colon Received: In formalin Tissue fragment(s): Multiple Size: Aggregate, 2.5 x 1.5  x 0.1 cm Description: Pink tissue fragments and yellow fecal material Entirely submitted in one cassette.   Final Diagnosis performed by Quay Burow, MD.   Electronically signed 01/11/2018 4:01:33PM The electronic signature indicates that the named Attending Pathologist has evaluated the specimen  Technical component performed at [facility], [address], [place], [place] [address] Lab: [phone] Dir: Rush Farmer, MD, MMM  Professional component performed at [facility], [facility], [place], [place], [place] [address] Lab: [phone] Dir: Dellia Nims. Rubinas, MD   Serotonin serum     Status: None   Collection Time: 01/23/18 11:55 AM  Result Value Ref Range   Serotonin, Serum 111 0 - 420 ng/mL    Comment: (NOTE) Performed At: [facility] [place], [place] [id number] Rush Farmer  MD [phone] Performed at [facility], [address] [place], [place] [address]   Comprehensive metabolic panel     Status: None   Collection Time: 01/23/18 11:55 AM  Result Value Ref Range   Sodium 138 135 - 145 mmol/L   Potassium 3.7 3.5 - 5.1 mmol/L   Chloride 102 101 - 111 mmol/L   CO2 25 22 - 32 mmol/L   Glucose, Bld 86 65 - 99 mg/dL   BUN 12 6 - 20 mg/dL   Creatinine, Ser 0.75 0.44 - 1.00 mg/dL   Calcium 9.9 8.9 - 10.3 mg/dL   Total Protein 7.7 6.5 - 8.1 g/dL   Albumin 4.2 3.5 - 5.0 g/dL   AST 34 15 - 41 U/L   ALT 27 14 - 54 U/L   Alkaline Phosphatase 63 38 - 126 U/L   Total Bilirubin 0.7 0.3 - 1.2 mg/dL   GFR calc non Af Amer >60 >60 mL/min   GFR calc Af Amer >60 >60 mL/min    Comment: (NOTE) The eGFR has been calculated using the CKD EPI equation. This calculation has not been validated in all clinical situations. eGFR's persistently <60 mL/min signify possible Chronic Kidney Disease.    Anion gap 11 5 - 15    Comment: Performed at [facility], [address] [place], [place] [address]  CBC with Differential/Platelet     Status: None   Collection Time: 01/23/18 11:55 AM  Result Value Ref Range   WBC 4.4 3.6 - 11.0 K/uL   RBC 4.48 3.80 - 5.20 MIL/uL   Hemoglobin 14.3 12.0 - 16.0 g/dL   HCT 41.6 35.0 - 47.0 %   MCV 92.9 80.0 - 100.0 fL   MCH 31.9 26.0 - 34.0 pg   MCHC 34.4 32.0 - 36.0 g/dL   RDW 13.9 11.5 - 14.5 %   Platelets 272 150 - 440 K/uL   Neutrophils Relative % 36 %   Neutro Abs 1.6 1.4 - 6.5 K/uL   Lymphocytes Relative 40 %   Lymphs Abs 1.8 1.0 - 3.6 K/uL   Monocytes Relative 19 %   Monocytes Absolute 0.8 0.2 - 0.9 K/uL   Eosinophils Relative 4 %   Eosinophils Absolute 0.2  0 - 0.7 K/uL   Basophils Relative 1 %   Basophils Absolute 0.0 0 - 0.1 K/uL    Comment: Performed at [facility], [address] [place], [place] [address]  Basic metabolic panel     Status: Abnormal   Collection Time: 02/05/18  9:40 AM  Result  Value Ref Range   Sodium 137 135 - 145 mEq/L   Potassium 3.6 3.5 - 5.1 mEq/L   Chloride 101 96 - 112 mEq/L   CO2 28 19 - 32 mEq/L   Glucose, Bld 179 (H) 70 - 99 mg/dL   BUN 12 6 - 23 mg/dL   Creatinine, Ser 0.75 0.40 - 1.20 mg/dL   Calcium 9.4 8.4 - 10.5 mg/dL   GFR 97.60 >60.00 mL/min  Hemoglobin A1c     Status: None   Collection Time: 02/05/18  9:40 AM  Result Value Ref Range   Hgb A1c MFr Bld 6.5 4.6 - 6.5 %    Comment: Glycemic Control Guidelines for People with Diabetes:Non Diabetic:  <6%Goal of Therapy: <7%Additional Action Suggested:  >8%   5 HIAA, quantitative, urine, 24 hour     Status: None   Collection Time: 02/15/18  8:26 AM  Result Value Ref Range   5-HIAA, Ur 1.6 Undefined mg/L    Comment: (NOTE) This test was developed and its performance characteristics determined by [facility]. It has not been cleared or approved by the Food and Drug Administration.    5-HIAA,Quant.,24 Hr Urine 4.5 0.0 - 14.9 mg/24 hr    Comment: (NOTE) Performed At: [facility] [place], [place] [id number] Rush Farmer MD FX:[phone]    Total Volume 2,800     Comment: Performed at [facility], [address] [place], [place] [address]   Objective  Body mass index is 37.4 kg/m. Wt Readings from Last 3 Encounters:  03/13/18 246 lb (111.6 kg)  02/14/18 246 lb 14.4 oz (112 kg)  02/05/18 249 lb (112.9 kg)   Temp Readings from Last 3 Encounters:  03/13/18 98.3 F (36.8 C) (Oral)  02/14/18 (!) 97.4 F (36.3 C) (Tympanic)  02/05/18 98.6 F (37 C) (Oral)   BP Readings from Last 3 Encounters:  03/13/18 122/68  02/14/18 123/74  02/05/18 110/70   Pulse Readings from Last 3 Encounters:  03/13/18 70  02/14/18 90  02/05/18 68    Physical Exam  Constitutional: She is oriented to person, place, and time. Vital signs are normal. She appears well-developed and well-nourished. She is cooperative.  HENT:  Head: Normocephalic and atraumatic.  Mouth/Throat:  Oropharynx is clear and moist and mucous membranes are normal.  Eyes: Pupils are equal, round, and reactive to light. Conjunctivae are normal.  Cardiovascular: Normal rate, regular rhythm and normal heart sounds.  Pulmonary/Chest: Effort normal and breath sounds normal. She has no wheezes.  Neurological: She is alert and oriented to person, place, and time. Gait normal.  Skin: Skin is warm, dry and intact.  Psychiatric: She has a normal mood and affect. Her speech is normal and behavior is normal. Judgment and thought content normal. Cognition and memory are normal.  Nursing note and vitals reviewed.   Assessment   1. Mild copd exacerbation in former smoker  2. Arthritis  3. HM Plan   1. duoneb x 1 today  zpack  Cold handout  2. Refilled mobic  3. Ordered mammogram   Provider: Dr. Olivia Mackie McLean-Scocuzza-Internal Medicine

## 2018-03-13 NOTE — Progress Notes (Signed)
Pre visit review using our clinic review tool, if applicable. No additional management support is needed unless otherwise documented below in the visit note. 

## 2018-04-02 ENCOUNTER — Other Ambulatory Visit: Payer: Self-pay | Admitting: Family

## 2018-04-09 ENCOUNTER — Ambulatory Visit (INDEPENDENT_AMBULATORY_CARE_PROVIDER_SITE_OTHER): Payer: Medicare Other

## 2018-04-09 ENCOUNTER — Other Ambulatory Visit: Payer: Self-pay

## 2018-04-09 VITALS — BP 122/64 | HR 79 | Temp 98.1°F | Resp 14 | Ht 68.0 in | Wt 243.8 lb

## 2018-04-09 DIAGNOSIS — Z760 Encounter for issue of repeat prescription: Secondary | ICD-10-CM

## 2018-04-09 DIAGNOSIS — E1140 Type 2 diabetes mellitus with diabetic neuropathy, unspecified: Secondary | ICD-10-CM

## 2018-04-09 DIAGNOSIS — Z0000 Encounter for general adult medical examination without abnormal findings: Secondary | ICD-10-CM

## 2018-04-09 MED ORDER — ONETOUCH DELICA LANCETS FINE MISC: 3 refills | Status: DC

## 2018-04-09 NOTE — Progress Notes (Signed)
Subjective:   Cynthia [name] is a 72 y.o. female who presents for Medicare Annual (Subsequent) preventive examination.  Review of Systems:  Cardiac Risk Factors include: advanced age (>21men, >2 women);diabetes mellitus;hypertension;obesity (BMI >30kg/m2)   No ROS.  Medicare Wellness Visit. Additional risk factors are reflected in the social history.     Objective:     Vitals: BP 122/64 (BP Location: Left Arm, Patient Position: Sitting, Cuff Size: Normal)   Pulse 79   Temp 98.1 F (36.7 C) (Oral)   Resp 14   Ht 5\' 8"  (1.727 m)   Wt 243 lb 12.8 oz (110.6 kg)   SpO2 96%   BMI 37.07 kg/m   Body mass index is 37.07 kg/m.  Advanced Directives 04/09/2018 02/14/2018 01/23/2018 01/08/2018 04/20/2017 04/07/2017 10/09/2016  Does Patient Have a Medical Advance Directive? No No No No No No No  Would patient like information on creating a medical advance directive? No - Patient declined - No - Patient declined No - Patient declined No - Patient declined No - Patient declined -    Tobacco Social History   Tobacco Use  Smoking Status Former Smoker  . Packs/day: 1.00  . Years: 20.00  . Pack years: 20.00  . Types: Cigarettes  . Last attempt to quit: 11/17/2012  . Years since quitting: 5.3  Smokeless Tobacco Former Engineer, structural given: Not Answered   Clinical Intake:  Pre-visit preparation completed: Yes  Pain : No/denies pain     Nutritional Status: BMI > 30  Obese Diabetes: Yes(Followed by pcp)  How often do you need to have someone help you when you read instructions, pamphlets, or other written materials from your doctor or pharmacy?: 1 - Never  Interpreter Needed?: No     Past Medical History:  Diagnosis Date  . Arthritis   . COPD (chronic obstructive pulmonary disease) ([place])   . Coronary artery disease   . Diabetes mellitus without complication ([place])   . Hyperlipidemia   . Hypertension   . Lung nodule   . Thyroid disease    Past Surgical History:    Procedure Laterality Date  . BREAST BIOPSY Left 11/25/2015   stereo  neg  . BREAST EXCISIONAL BIOPSY Left yrs ago   benign  . COLONOSCOPY WITH PROPOFOL N/A 04/20/2017   Procedure: COLONOSCOPY WITH PROPOFOL;  Surgeon: [name], MD;  Location: [facility] ENDOSCOPY;  Service: Endoscopy;  Laterality: N/A;  . COLONOSCOPY WITH PROPOFOL N/A 01/08/2018   Procedure: COLONOSCOPY WITH PROPOFOL;  Surgeon: [name], MD;  Location: [facility] ENDOSCOPY;  Service: Gastroenterology;  Laterality: N/A;  . THYROIDECTOMY     Dr. [name]  . VAGINAL DELIVERY     4   Family History  Problem Relation Age of Onset  . Hypertension Mother   . Hypertension Father   . Diabetes Sister   . Thyroid disease Sister   . Breast cancer Maternal Aunt   . Breast cancer Maternal Aunt    Social History   Socioeconomic History  . Marital status: Married    Spouse name: Not on file  . Number of children: 4  . Years of education: Not on file  . Highest education level: Not on file  Occupational History  . Not on file  Social Needs  . Financial resource strain: Not hard at all  . Food insecurity:    Worry: Never true    Inability: Never true  . Transportation needs:    Medical: No  Non-medical: No  Tobacco Use  . Smoking status: Former Smoker    Packs/day: 1.00    Years: 20.00    Pack years: 20.00    Types: Cigarettes    Last attempt to quit: 11/17/2012    Years since quitting: 5.3  . Smokeless tobacco: Former Network engineer and Sexual Activity  . Alcohol use: No  . Drug use: No  . Sexual activity: Never  Lifestyle  . Physical activity:    Days per week: Not on file    Minutes per session: Not on file  . Stress: Not on file  Relationships  . Social connections:    Talks on phone: Not on file    Gets together: Not on file    Attends religious service: Not on file    Active member of club or organization: Not on file    Attends meetings of clubs or organizations: Not on file    Relationship status: Not  on file  Other Topics Concern  . Not on file  Social History Narrative   Lives in [place] with husband. Has 4 children.      4 grandchildren.      Work - Retired from [employer]- walking the track, 4x per week      Diet- regular       Outpatient Encounter Medications as of 04/09/2018  Medication Sig  . albuterol (PROVENTIL HFA;VENTOLIN HFA) 108 (90 Base) MCG/ACT inhaler Inhale 1-2 puffs into the lungs every 6 (six) hours as needed for wheezing or shortness of breath.  Marland Kitchen amLODipine (NORVASC) 10 MG tablet TAKE 1 TABLET (10 MG TOTAL) BY MOUTH DAILY.  Marland Kitchen aspirin 81 MG tablet Take 81 mg by mouth daily.  Marland Kitchen atorvastatin (LIPITOR) 20 MG tablet TAKE 1 TABLET (20 MG TOTAL) BY MOUTH DAILY.  . Calcium Carbonate-Vitamin D 600-400 MG-UNIT per tablet Take 1 tablet by mouth daily.  Marland Kitchen gabapentin (NEURONTIN) 100 MG capsule TAKE ONE CAPSULE BY MOUTH 3 TIMES A DAY  . glucose blood (ONE TOUCH ULTRA TEST) test strip TEST BLOOD SUGAR 1-2 TIMES A DAY  . levothyroxine (SYNTHROID, LEVOTHROID) 137 MCG tablet TAKE 1 TABLET (137 MCG TOTAL) BY MOUTH DAILY BEFORE BREAKFAST.  Marland Kitchen losartan-hydrochlorothiazide (HYZAAR) 50-12.5 MG tablet Take 1 tablet by mouth daily.  . meloxicam (MOBIC) 15 MG tablet Take 1 tablet (15 mg total) by mouth daily. As needed  . metFORMIN (GLUCOPHAGE) 500 MG tablet TAKE 1 TABLET (500 MG TOTAL) BY MOUTH 2 (TWO) TIMES DAILY WITH A MEAL.  . metoprolol tartrate (LOPRESSOR) 25 MG tablet Take 1 tablet (25 mg total) by mouth 2 (two) times daily.  Marland Kitchen omeprazole (PRILOSEC) 20 MG capsule TAKE 1 CAPSULE BY MOUTH EVERY DAY  . ONETOUCH DELICA LANCETS FINE MISC CHECK BLOOD GLUCOSE TWICE A DAY  . [DISCONTINUED] azithromycin (ZITHROMAX) 250 MG tablet 2 pills day 1 and 1 pill day 2- 5   No facility-administered encounter medications on file as of 04/09/2018.     Activities of Daily Living In your present state of health, do you have any difficulty performing the following activities:  04/09/2018  Hearing? N  Vision? N  Difficulty concentrating or making decisions? N  Walking or climbing stairs? Y  Comment COPD. She paces herself.   Dressing or bathing? N  Doing errands, shopping? N  Preparing Food and eating ? N  Using the Toilet? N  In the past six months, have you accidently leaked urine? N  Do you  have problems with loss of bowel control? N  Managing your Medications? N  Managing your Finances? N  Housekeeping or managing your Housekeeping? N  Some recent data might be hidden    Patient Care Team: Burnard Hawthorne, FNP as PCP - General (Family Medicine) Jackolyn Confer, MD (Internal Medicine) Clent Jacks, RN as Registered Nurse    Assessment:   This is a routine wellness examination for [place].  The goal of the wellness visit is to assist the patient how to close the gaps in care and create a preventative care plan for the patient.   The roster of all physicians providing medical care to patient is listed in the Snapshot section of the chart.  Taking calcium VIT D as appropriate/Osteoporosis risk reviewed.    Safety issues reviewed; Smoke and carbon monoxide detectors in the home. No firearms in the home. Wears seatbelts when driving or riding with others. No violence in the home.  They do not have excessive sun exposure.  Discussed the need for sun protection: hats, long sleeves and the use of sunscreen if there is significant sun exposure.  Patient is alert, normal appearance, oriented to person/place/and time. Correctly identified the president of the [place] and recalls of 2/3 words.Performs simple calculations and can read correct time from watch face. Displays appropriate judgement.  No new identified risk were noted.  No failures at ADL's or IADL's.    BMI- discussed the importance of a healthy diet, water intake and the benefits of aerobic exercise. Educational material provided. I encouraged a low carb/diabetic diet.  Glucerna  supplement with coupons provided as meal replacement or snack.  She has adequate water intake and walks 150 minutes per week for exercise.   24 hour diet recall: Regular diet  Diabetes- she monitors her blood sugar daily.    Dental- dentures.  Sleep patterns- Sleeps through the night without issues.   Health maintenance gaps- closed.  Patient Concerns: None at this time. Follow up with PCP as needed.  Exercise Activities and Dietary recommendations Current Exercise Habits: Home exercise routine, Type of exercise: walking, Time (Minutes): 30, Frequency (Times/Week): 5, Weekly Exercise (Minutes/Week): 150, Intensity: Mild  Goals    . DIET - REDUCE PORTION SIZE     Low carb diet       Fall Risk Fall Risk  04/09/2018 03/13/2018 07/18/2017 04/07/2017 02/15/2017  Falls in the past year? Yes No No No No  Number falls in past yr: 1 - - - -  Injury with Fall? No - - - -  Comment She missed a step - - - -  Follow up Falls prevention discussed - - - -   Depression Screen PHQ 2/9 Scores 04/09/2018 03/13/2018 07/18/2017 04/07/2017  PHQ - 2 Score 0 0 0 0  PHQ- 9 Score - - - 0     Cognitive Function MMSE - Mini Mental State Exam 04/07/2017 04/07/2016 04/02/2015  Orientation to time 5 5 5   Orientation to Place 5 5 5   Registration 3 3 3   Attention/ Calculation 2 5 5   Attention/Calculation-comments Difficulty performing simple calculation - -  Recall 3 3 3   Language- name 2 objects 2 2 2   Language- repeat 1 1 1   Language- follow 3 step command 3 3 3   Language- read & follow direction 1 1 1   Write a sentence 1 1 1   Copy design 1 1 1   Total score 27 30 30      6CIT Screen 04/09/2018  What  Year? 0 points  What month? 0 points  What time? 0 points  Count back from 20 0 points  Months in reverse 2 points    Immunization History  Administered Date(s) Administered  . Influenza, High Dose Seasonal PF 07/18/2017  . Influenza,inj,Quad PF,6+ Mos 06/13/2013, 06/13/2014, 07/03/2015,  05/11/2016  . Influenza-Unspecified 07/18/2012  . Pneumococcal Conjugate-13 03/07/2014  . Pneumococcal Polysaccharide-23 03/08/2013  . Tdap 05/17/2016   Screening Tests Health Maintenance  Topic Date Due  . INFLUENZA VACCINE  04/19/2018  . OPHTHALMOLOGY EXAM  07/04/2018  . HEMOGLOBIN A1C  08/08/2018  . FOOT EXAM  02/06/2019  . MAMMOGRAM  03/17/2019  . TETANUS/TDAP  05/17/2026  . COLONOSCOPY  01/09/2028  . DEXA SCAN  Completed  . Hepatitis C Screening  Completed  . PNA vac Low Risk Adult  Completed      Plan:    End of life planning; Advance aging; Advanced directives discussed. Additional information regarding HCPOA/Living Will declined.     I have personally reviewed and noted the following in the patient's chart:   . Medical and social history . Use of alcohol, tobacco or illicit drugs  . Current medications and supplements . Functional ability and status . Nutritional status . Physical activity . Advanced directives . List of other physicians . Hospitalizations, surgeries, and ER visits in previous 12 months . Vitals . Screenings to include cognitive, depression, and falls . Referrals and appointments  In addition, I have reviewed and discussed with patient certain preventive protocols, quality metrics, and best practice recommendations. A written personalized care plan for preventive services as well as general preventive health recommendations were provided to patient.     Varney Biles, LPN  [id number]/[id number]   Reviewed above information.  Agree with assessment and plan.   Dr Nicki Reaper   Dr

## 2018-04-09 NOTE — Patient Instructions (Addendum)
  Ms. Scheffler , Thank you for taking time to come for your Medicare Wellness Visit. I appreciate your ongoing commitment to your health goals. Please review the following plan we discussed and let me know if I can assist you in the future.   These are the goals we discussed: Goals    . DIET - REDUCE PORTION SIZE     Low carb diet       This is a list of the screening recommended for you and due dates:  Health Maintenance  Topic Date Due  . Flu Shot  04/19/2018  . Eye exam for diabetics  07/04/2018  . Hemoglobin A1C  08/08/2018  . Complete foot exam   02/06/2019  . Mammogram  03/17/2019  . Tetanus Vaccine  05/17/2026  . Colon Cancer Screening  01/09/2028  . DEXA scan (bone density measurement)  Completed  .  Hepatitis C: One time screening is recommended by Center for Disease Control  (CDC) for  adults born from [birth year] through 1965.   Completed  . Pneumonia vaccines  Completed

## 2018-04-10 ENCOUNTER — Ambulatory Visit
Admission: RE | Admit: 2018-04-10 | Discharge: 2018-04-10 | Disposition: A | Discharge status: Home / Self Care | Payer: Medicare Other | Source: Ambulatory Visit | Attending: Internal Medicine | Admitting: Internal Medicine

## 2018-04-10 DIAGNOSIS — Z1231 Encounter for screening mammogram for malignant neoplasm of breast: Secondary | ICD-10-CM | POA: Diagnosis not present

## 2018-04-10 HISTORY — DX: Malignant (primary) neoplasm, unspecified: C80.1

## 2018-04-10 IMAGING — MG MM DIGITAL SCREENING BILAT W/ TOMO W/ CAD
8 series · 8 of 24 positions shown · non-contrast
Comparison: Previous exam(s).

CLINICAL DATA: Screening.

EXAM:
DIGITAL SCREENING BILATERAL MAMMOGRAM WITH TOMO AND CAD

[R MLO synth-2D]
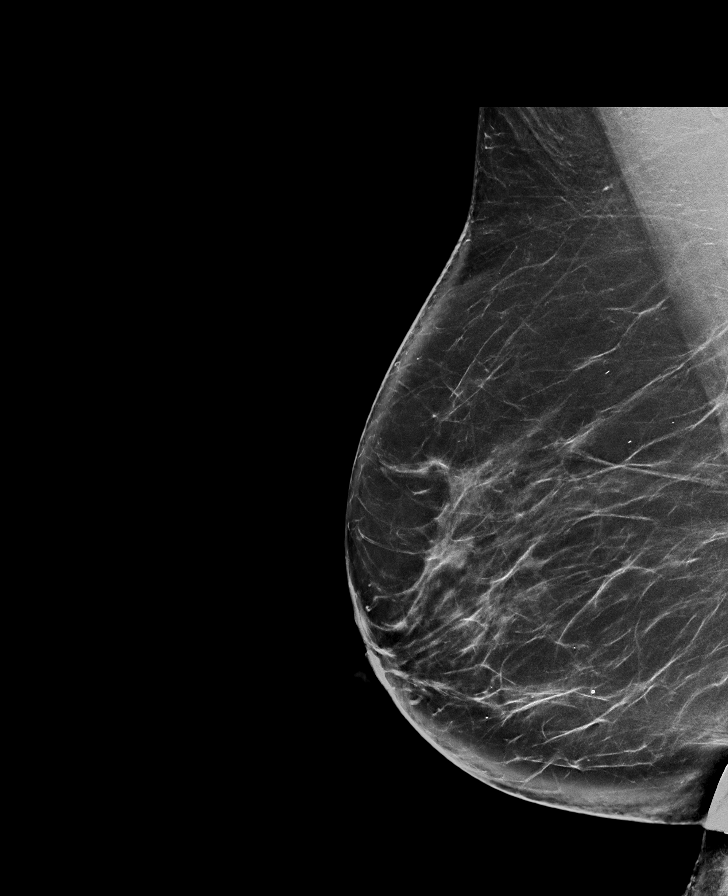

[L CC synth-2D]
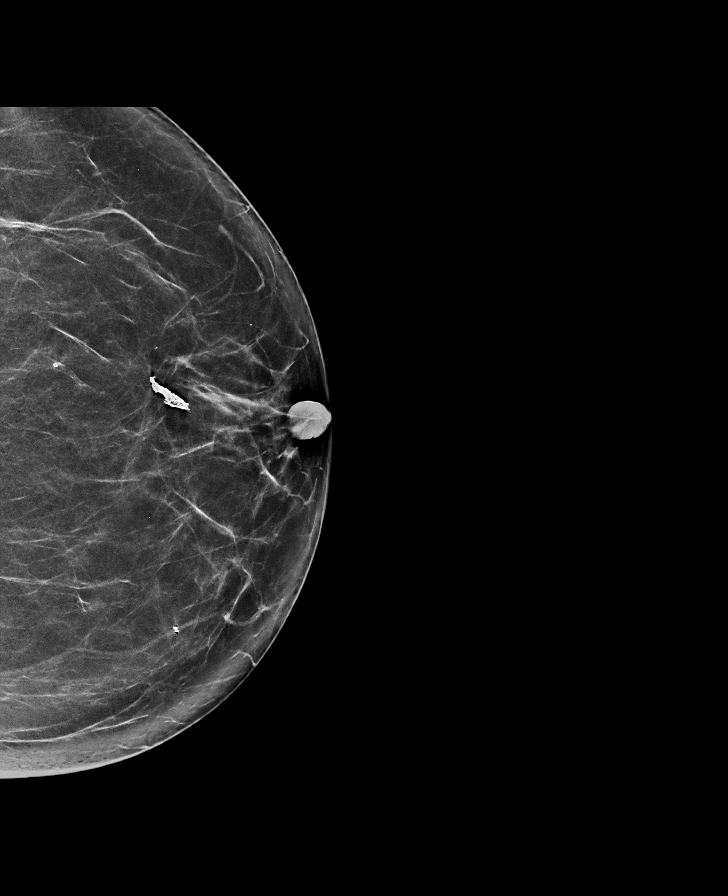

[R CC synth-2D]
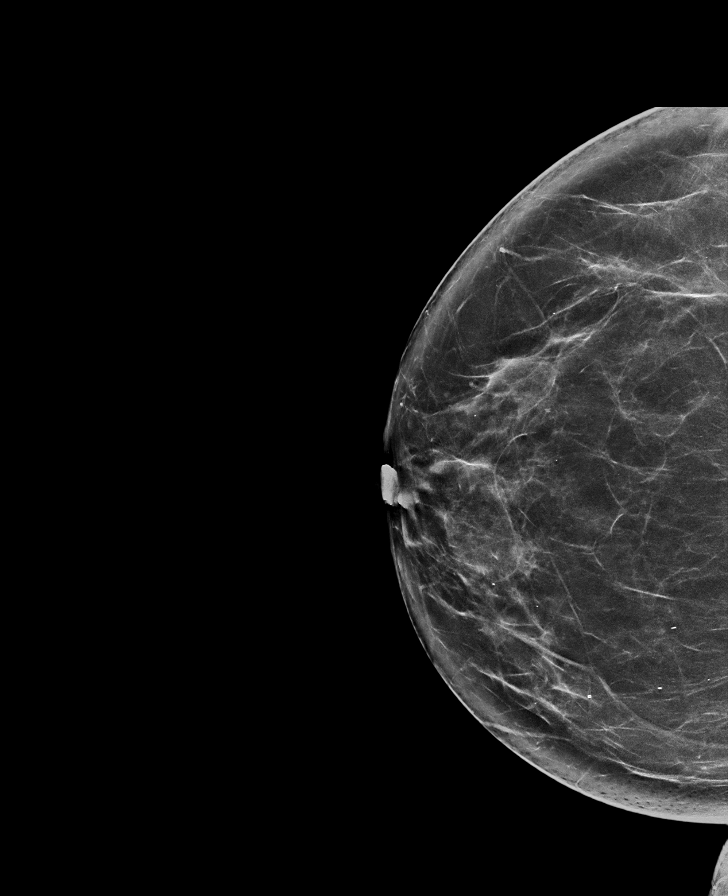

[L MLO synth-2D]
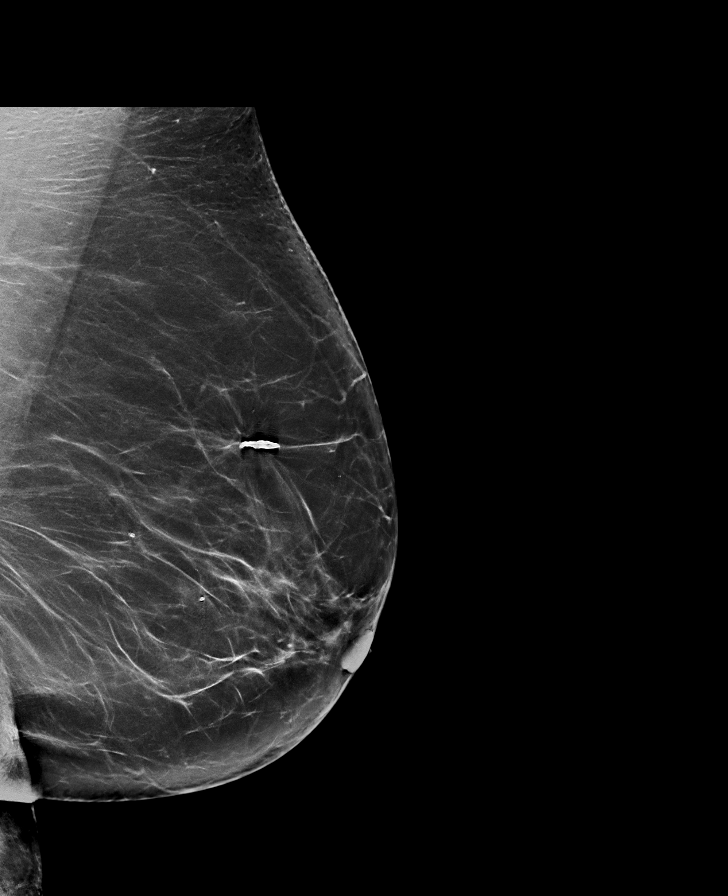

[L CC tomo · tomo slice 39/78.0]
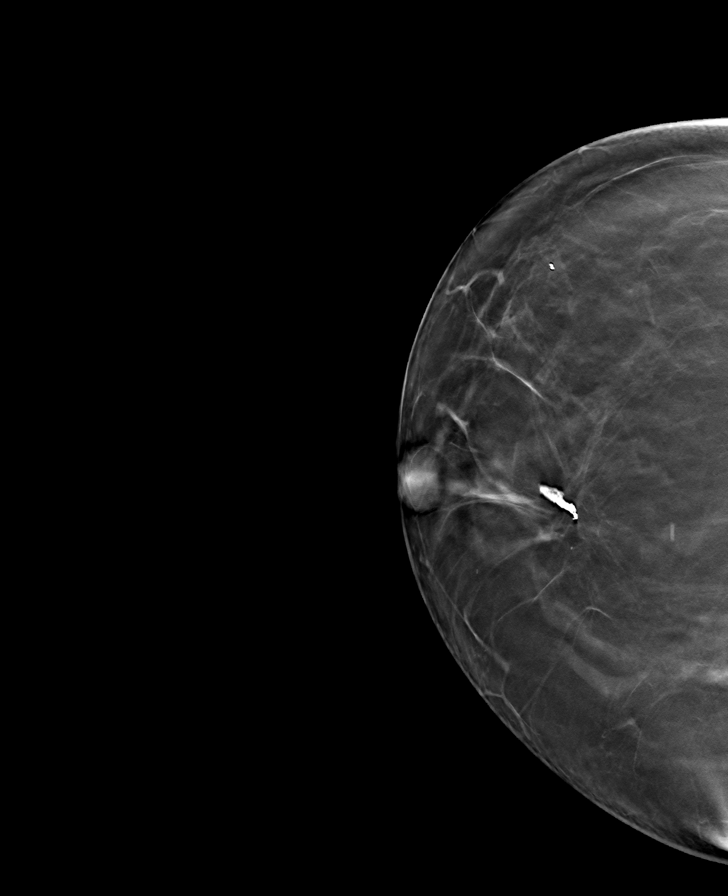

[R MLO tomo · tomo slice 44/87.0]
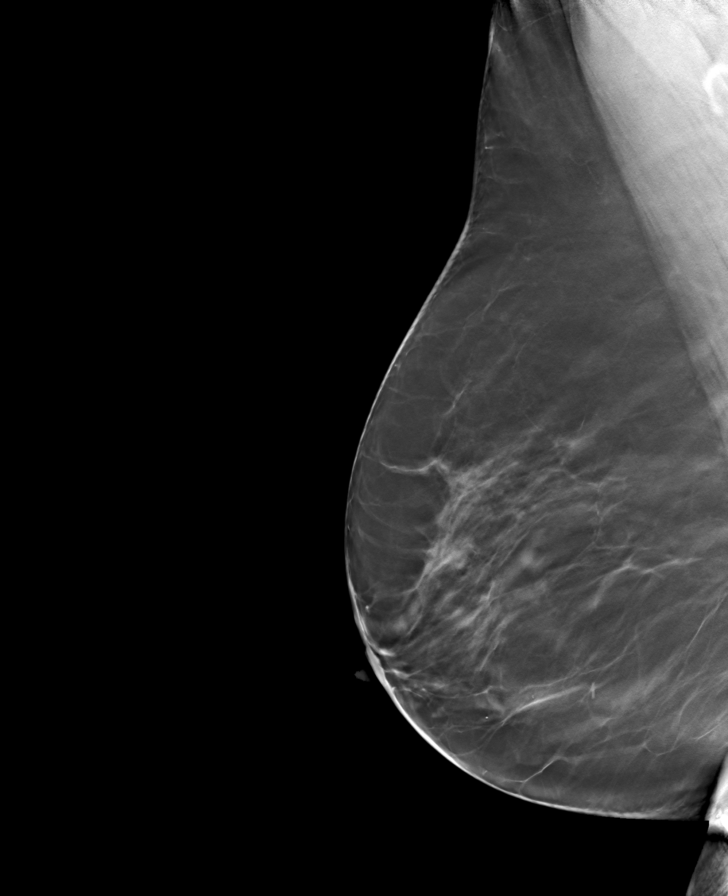

[R CC tomo · tomo slice 40/79.0]
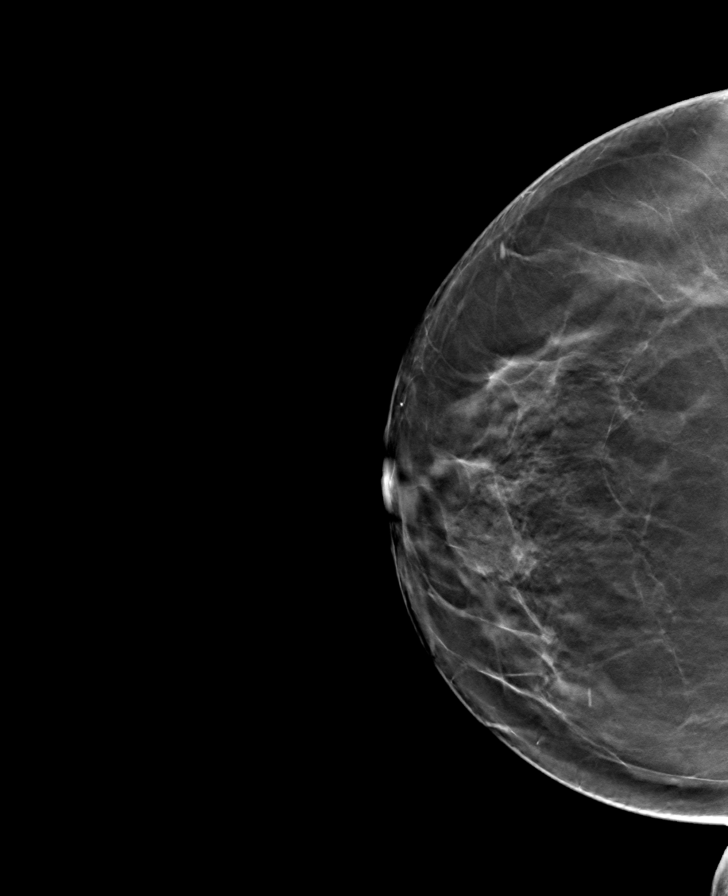

[L MLO tomo · tomo slice 47/94.0]
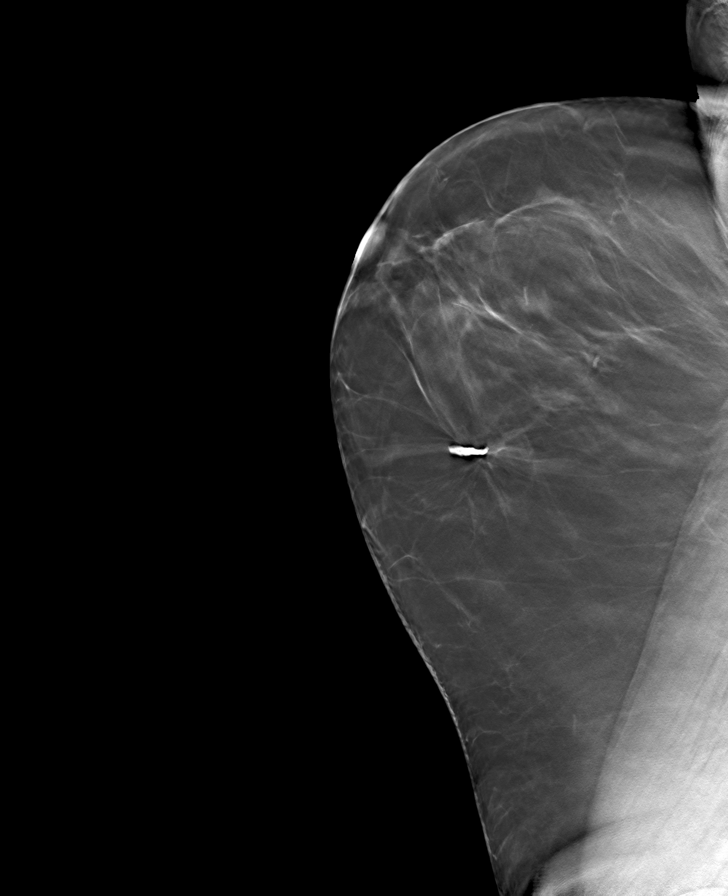

[8 of 24 positions shown; findings below may reference images not displayed]

ACR Breast Density Category b: There are scattered areas of
fibroglandular density.
FINDINGS: There are no findings suspicious for malignancy. Images were
processed with CAD.
IMPRESSION: No mammographic evidence of malignancy. A result letter of this
screening mammogram will be mailed directly to the patient.

RECOMMENDATION:
Screening mammogram in one year. (Code:[TQ])

BI-RADS CATEGORY  1: Negative.

## 2018-04-26 ENCOUNTER — Telehealth: Payer: Self-pay | Admitting: Family

## 2018-04-26 ENCOUNTER — Other Ambulatory Visit: Payer: Self-pay | Admitting: *Deleted

## 2018-04-26 MED ORDER — ATORVASTATIN CALCIUM 20 MG PO TABS: ORAL_TABLET | ORAL | 0 refills | Status: DC

## 2018-04-26 NOTE — Telephone Encounter (Unsigned)
Copied from [place] [phone]. Topic: Quick Communication - Rx Refill/Question >> Apr 26, 2018 10:19 AM Yvette Rack wrote: Medication: atorvastatin (LIPITOR) 20 MG tablet  Has the patient contacted their pharmacy? no  Preferred Pharmacy (with phone number or street name): [id number] [place], [place] - [address] [phone] (Phone) [phone] (Fax)  Agent: Please be advised that RX refills may take up to 3 business days. We ask that you follow-up with your pharmacy.

## 2018-04-26 NOTE — Telephone Encounter (Signed)
Rx refilled per protocol. Patient current with follow up visits and last lab 07/28/17

## 2018-05-14 ENCOUNTER — Ambulatory Visit (INDEPENDENT_AMBULATORY_CARE_PROVIDER_SITE_OTHER): Payer: Medicare Other | Admitting: Family Medicine

## 2018-05-14 ENCOUNTER — Encounter: Payer: Self-pay | Admitting: Family Medicine

## 2018-05-14 VITALS — BP 122/78 | HR 74 | Temp 97.8°F | Ht 68.0 in | Wt 243.6 lb

## 2018-05-14 DIAGNOSIS — L299 Pruritus, unspecified: Secondary | ICD-10-CM | POA: Diagnosis not present

## 2018-05-14 DIAGNOSIS — R21 Rash and other nonspecific skin eruption: Secondary | ICD-10-CM | POA: Diagnosis not present

## 2018-05-14 MED ORDER — TRIAMCINOLONE ACETONIDE 0.1 % EX CREA: 1.0000 "application " | TOPICAL_CREAM | Freq: Two times a day (BID) | CUTANEOUS | 0 refills | Status: DC

## 2018-05-14 NOTE — Progress Notes (Signed)
   Subjective:    Patient ID: Cynthia [name], female    DOB: [birth year], 72 y.o.   MRN: [id number]  HPI  Presents to clinic with c/o rash on right upper extremity.  Rash began on right forearm and has noticed some spots on the right upper arm. It is itchy.  She has tried some over-the-counter bacitracin ointment, but does not seem to help.  Denies any new soaps detergents or lotions.  Denies any known contact with allergen.  No one around her has similar rash.  Patient Active Problem List   Diagnosis Date Noted  . COPD exacerbation ([place]) 03/13/2018  . Mouth sore 02/05/2018  . 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 Acute pain of left foot 07/30/2017  . Screening for breast cancer 02/15/2017  . Screen for colon cancer 02/15/2017  . Routine general medical examination at a health care facility 10/21/2015  . Medicare annual wellness visit, subsequent 03/08/2013  . Abnormal CT scan, chest 11/28/2012  . Hypertension 10/18/2012  . Arthritis 10/18/2012  . Type 2 diabetes mellitus with diabetic neuropathy, unspecified ([place]) 10/18/2012  . Hypothyroidism 10/18/2012  . Hyperlipidemia 10/18/2012   Social History   Tobacco Use  . Smoking status: Former Smoker    Packs/day: 1.00    Years: 20.00    Pack years: 20.00    Types: Cigarettes    Last attempt to quit: 11/17/2012    Years since quitting: 5.4  . Smokeless tobacco: Former Network engineer Use Topics  . Alcohol use: No   Review of Systems  Constitutional: Negative for chills, fatigue and fever.  HENT: Negative for congestion, ear pain, sinus pain and sore throat.   Eyes: Negative.   Respiratory: Negative for cough, shortness of breath and wheezing.   Cardiovascular: Negative for chest pain, palpitations and leg swelling.  Gastrointestinal: Negative for abdominal pain, diarrhea, nausea and vomiting.  Genitourinary: Negative for dysuria, frequency and urgency.  Musculoskeletal: Negative for arthralgias and myalgias.    Skin:+rash Neurological: Negative for syncope, light-headedness and headaches.  Psychiatric/Behavioral: The patient is not nervous/anxious.       Objective:   Physical Exam  Constitutional: She is oriented to person, place, and time. She appears well-developed and well-nourished. No distress.  HENT:  Head: Normocephalic and atraumatic.  Eyes: EOM are normal. No scleral icterus.  Cardiovascular: Normal rate and regular rhythm.  Pulmonary/Chest: Effort normal and breath sounds normal. No respiratory distress.  Neurological: She is alert and oriented to person, place, and time.  Gait normal  Skin: Skin is warm and dry. Rash noted. No pallor.  Small rash area on right forearm about diameter of qaurter. Few small scattered rash areas on upper right arm, each about diameter of pea.  Psychiatric: She has a normal mood and affect. Her behavior is normal.  Nursing note and vitals reviewed.  Vitals:   05/14/18 1302  BP: 122/78  Pulse: 74  Temp: 97.8 F (36.6 C)  SpO2: 95%    Assessment & Plan:    Eruption of skin - unknown cause of rash.  We will do triamcinolone cream twice daily for the next week, Rx sent.  Patient advised to monitor any sheets in her clothing she has one recently to avoid furthering spread of rash.  Keep your regular follow-up as planned

## 2018-05-14 NOTE — Patient Instructions (Signed)
Great to meet you! 

## 2018-05-29 ENCOUNTER — Other Ambulatory Visit: Payer: Self-pay | Admitting: Family

## 2018-05-29 DIAGNOSIS — M1711 Unilateral primary osteoarthritis, right knee: Secondary | ICD-10-CM

## 2018-06-16 ENCOUNTER — Other Ambulatory Visit: Payer: Self-pay | Admitting: Family

## 2018-06-22 LAB — HM DIABETES EYE EXAM

## 2018-07-11 ENCOUNTER — Encounter: Payer: Self-pay | Admitting: Family

## 2018-07-11 ENCOUNTER — Ambulatory Visit (INDEPENDENT_AMBULATORY_CARE_PROVIDER_SITE_OTHER): Payer: Medicare Other | Admitting: Family

## 2018-07-11 VITALS — BP 122/78 | HR 76 | Temp 98.6°F | Resp 15 | Ht 68.0 in | Wt 244.4 lb

## 2018-07-11 DIAGNOSIS — E785 Hyperlipidemia, unspecified: Secondary | ICD-10-CM

## 2018-07-11 DIAGNOSIS — E039 Hypothyroidism, unspecified: Secondary | ICD-10-CM

## 2018-07-11 DIAGNOSIS — K219 Gastro-esophageal reflux disease without esophagitis: Secondary | ICD-10-CM

## 2018-07-11 DIAGNOSIS — I10 Essential (primary) hypertension: Secondary | ICD-10-CM

## 2018-07-11 DIAGNOSIS — M1711 Unilateral primary osteoarthritis, right knee: Secondary | ICD-10-CM | POA: Diagnosis not present

## 2018-07-11 DIAGNOSIS — Z23 Encounter for immunization: Secondary | ICD-10-CM | POA: Diagnosis not present

## 2018-07-11 DIAGNOSIS — E1140 Type 2 diabetes mellitus with diabetic neuropathy, unspecified: Secondary | ICD-10-CM | POA: Diagnosis not present

## 2018-07-11 DIAGNOSIS — C7A8 Other malignant neuroendocrine tumors: Secondary | ICD-10-CM

## 2018-07-11 MED ORDER — ATORVASTATIN CALCIUM 20 MG PO TABS: ORAL_TABLET | ORAL | 0 refills | Status: DC

## 2018-07-11 MED ORDER — OMEPRAZOLE 20 MG PO CPDR: DELAYED_RELEASE_CAPSULE | ORAL | 1 refills | Status: DC

## 2018-07-11 NOTE — Assessment & Plan Note (Signed)
Discussed with patient the importance of following up with Dr. Vicente Males.  Review chart ,patient has appoint with him scheduled for next week.  Reminded patient of this and also called to confirm the patient does not fact have an appointment next week.  Will follow

## 2018-07-11 NOTE — Patient Instructions (Addendum)
[name] and ensure that YOUR losartan was not recalled.   You have an appointment with [name] 10/30. Please call their office since you think this was changed.   Fasting lab appointment when you can

## 2018-07-11 NOTE — Assessment & Plan Note (Signed)
At goal. No changes made. Of note:  she will confirm with pharmacy that her losartan has not been recalled based on  Manufacturer/ lot.

## 2018-07-11 NOTE — Progress Notes (Signed)
Subjective:    Patient ID: Cynthia [name], female    DOB: [birth year], 72 y.o.   MRN: [id number]  CC: Cynthia [name] is a 72 y.o. female who presents today for follow up.   HPI: GERD- taking prilosec everyday. No trouble swallowing. She states pending upper GI 08/2018  Hypothyroid- no cold /heat intolerance. Compliant with medication  Repeat colonoscopy not scheduled.   HTN- compliant with medication.   DM- compliant with medication  Breathing well. No sob, cp.   Takes mobic QOD prn for knee pain.  On gabapentin TID which helps numbness in feet.   PET scan with Dr Tasia Catchings; no evidence of metastasis HISTORY:  Past Medical History:  Diagnosis Date  . Arthritis   . 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   Marland Kitchen COPD (chronic obstructive pulmonary disease) ([place])   . Coronary artery disease   . Diabetes mellitus without complication ([place])   . Hyperlipidemia   . Hypertension   . Lung nodule   . Thyroid disease    Past Surgical History:  Procedure Laterality Date  . BREAST BIOPSY Left 11/25/2015   stereo  neg  . BREAST EXCISIONAL BIOPSY Left yrs ago   benign  . COLONOSCOPY WITH PROPOFOL N/A 04/20/2017   Procedure: COLONOSCOPY WITH PROPOFOL;  Surgeon: [name], MD;  Location: [facility] ENDOSCOPY;  Service: Endoscopy;  Laterality: N/A;  . COLONOSCOPY WITH PROPOFOL N/A 01/08/2018   Procedure: COLONOSCOPY WITH PROPOFOL;  Surgeon: [name], MD;  Location: [facility] ENDOSCOPY;  Service: Gastroenterology;  Laterality: N/A;  . THYROIDECTOMY     Dr. [name]  . VAGINAL DELIVERY     4   Family History  Problem Relation Age of Onset  . Hypertension Mother   . Hypertension Father   . Diabetes Sister   . Thyroid disease Sister   . Breast cancer Maternal Aunt   . Breast cancer Maternal Aunt     Allergies: Patient has no known allergies. Current Outpatient Medications on File Prior to Visit  Medication Sig Dispense Refill  . albuterol (PROVENTIL HFA;VENTOLIN HFA) 108 (90 Base)  MCG/ACT inhaler Inhale 1-2 puffs into the lungs every 6 (six) hours as needed for wheezing or shortness of breath. 1 Inhaler 2  . amLODipine (NORVASC) 10 MG tablet TAKE 1 TABLET (10 MG TOTAL) BY MOUTH DAILY. 90 tablet 3  . aspirin 81 MG tablet Take 81 mg by mouth daily.    . Aspirin-Calcium Carbonate 81-777 MG TABS Take by mouth.    . Calcium Carbonate-Vit D-Min (CALCIUM 600+D PLUS MINERALS) 600-400 MG-UNIT TABS Take by mouth.    . Calcium Carbonate-Vitamin D 600-400 MG-UNIT per tablet Take 1 tablet by mouth daily.    Marland Kitchen gabapentin (NEURONTIN) 100 MG capsule TAKE ONE CAPSULE BY MOUTH 3 TIMES A DAY 90 capsule 6  . glucose blood (ONE TOUCH ULTRA TEST) test strip TEST BLOOD SUGAR 1-2 TIMES A DAY 100 each 4  . levothyroxine (SYNTHROID, LEVOTHROID) 137 MCG tablet TAKE 1 TABLET (137 MCG TOTAL) BY MOUTH DAILY BEFORE BREAKFAST. 90 tablet 1  . losartan-hydrochlorothiazide (HYZAAR) 50-12.5 MG tablet Take 1 tablet by mouth daily. 90 tablet 1  . meloxicam (MOBIC) 15 MG tablet Take 1 tablet (15 mg total) by mouth daily. As needed 90 tablet 1  . metFORMIN (GLUCOPHAGE) 500 MG tablet TAKE 1 TABLET (500 MG TOTAL) BY MOUTH 2 (TWO) TIMES DAILY WITH A MEAL. 180 tablet 1  . metoprolol tartrate (LOPRESSOR) 25 MG tablet Take 1 tablet (25 mg  total) by mouth 2 (two) times daily. 90 tablet 1  . ONETOUCH DELICA LANCETS FINE MISC CHECK BLOOD GLUCOSE TWICE A DAY 100 each 3  . triamcinolone cream (KENALOG) 0.1 % Apply 1 application topically 2 (two) times daily. 30 g 0   No current facility-administered medications on file prior to visit.     Social History   Tobacco Use  . Smoking status: Former Smoker    Packs/day: 1.00    Years: 20.00    Pack years: 20.00    Types: Cigarettes    Last attempt to quit: 11/17/2012    Years since quitting: 5.6  . Smokeless tobacco: Former Network engineer Use Topics  . Alcohol use: No  . Drug use: No    Review of Systems  Constitutional: Negative for chills and fever.  HENT:  Negative for trouble swallowing.   Respiratory: Negative for cough and shortness of breath.   Cardiovascular: Negative for chest pain and palpitations.  Gastrointestinal: Negative for abdominal pain, nausea and vomiting.  Endocrine: Negative for cold intolerance and heat intolerance.      Objective:    BP 122/78 (BP Location: Left Arm, Patient Position: Sitting, Cuff Size: Large)   Pulse 76   Temp 98.6 F (37 C) (Oral)   Resp 15   Ht 5\' 8"  (1.727 m)   Wt 244 lb 6 oz (110.8 kg)   SpO2 96%   BMI 37.16 kg/m  BP Readings from Last 3 Encounters:  07/11/18 122/78  05/14/18 122/78  04/09/18 122/64   Wt Readings from Last 3 Encounters:  07/11/18 244 lb 6 oz (110.8 kg)  05/14/18 243 lb 9.6 oz (110.5 kg)  04/09/18 243 lb 12.8 oz (110.6 kg)    Physical Exam  Constitutional: She appears well-developed and well-nourished.  Eyes: Conjunctivae are normal.  Cardiovascular: Normal rate, regular rhythm, normal heart sounds and normal pulses.  Pulmonary/Chest: Effort normal and breath sounds normal. She has no wheezes. She has no rhonchi. She has no rales.  Neurological: She is alert.  Skin: Skin is warm and dry.  Psychiatric: She has a normal mood and affect. Her speech is normal and behavior is normal. Thought content normal.  Vitals reviewed.      Assessment & Plan:   Problem List Items Addressed This Visit      Cardiovascular and Mediastinum   Hypertension - Primary    At goal. No changes made. Of note:  she will confirm with pharmacy that her losartan has not been recalled based on  Manufacturer/ lot.      Relevant Medications   atorvastatin (LIPITOR) 20 MG tablet   Other Relevant Orders   Comprehensive metabolic panel     Endocrine   Type 2 diabetes mellitus with diabetic neuropathy, unspecified ([place])    Suspect stable based on last A1c.  Pending A1c      Relevant Medications   atorvastatin (LIPITOR) 20 MG tablet   Other Relevant Orders   Hemoglobin A1c    Hypothyroidism    Clinically asymptomatic.  Pending TSH      Relevant Orders   TSH     Other   Hyperlipidemia   Relevant Medications   atorvastatin (LIPITOR) 20 MG tablet   Other Relevant Orders   Lipid panel   Neuroendocrine carcinoma of colon [facility])    Discussed with patient the importance of following up with Dr. Vicente Males.  Review chart ,patient has appoint with him scheduled for next week.  Reminded patient of this and also  called to confirm the patient does not fact have an appointment next week.  Will follow       Other Visit Diagnoses    Osteoarthritis of right knee, unspecified osteoarthritis type       Gastroesophageal reflux disease, esophagitis presence not specified       Relevant Medications   omeprazole (PRILOSEC) 20 MG capsule   Encounter for immunization       Relevant Orders   Flu vaccine HIGH DOSE PF (Completed)       I am having Cynthia [name] maintain her Calcium Carbonate-Vitamin D, aspirin, levothyroxine, glucose blood, gabapentin, losartan-hydrochlorothiazide, amLODipine, metoprolol tartrate, albuterol, meloxicam, ONETOUCH DELICA LANCETS FINE, CALCIUM 600+D PLUS MINERALS, Aspirin-Calcium Carbonate, triamcinolone cream, metFORMIN, omeprazole, and atorvastatin.   Meds ordered this encounter  Medications  . omeprazole (PRILOSEC) 20 MG capsule    Sig: TAKE 1 CAPSULE BY MOUTH EVERY DAY    Dispense:  90 capsule    Refill:  1  . atorvastatin (LIPITOR) 20 MG tablet    Sig: TAKE 1 TABLET (20 MG TOTAL) BY MOUTH DAILY.    Dispense:  90 tablet    Refill:  0    Return precautions given.   Risks, benefits, and alternatives of the medications and treatment plan prescribed today were discussed, and patient expressed understanding.   Education regarding symptom management and diagnosis given to patient on AVS.  Continue to follow with Burnard Hawthorne, FNP for routine health maintenance.   Cynthia [name] and I agreed with plan.   Mable Paris,  FNP

## 2018-07-11 NOTE — Assessment & Plan Note (Signed)
Clinically asymptomatic.  Pending TSH

## 2018-07-11 NOTE — Assessment & Plan Note (Signed)
Suspect stable based on last A1c.  Pending A1c

## 2018-07-12 NOTE — Progress Notes (Signed)
Appointment scheduled with [name] 07/18/18 @ 830 am patient aware of appointment

## 2018-07-13 ENCOUNTER — Other Ambulatory Visit: Payer: Self-pay

## 2018-07-17 ENCOUNTER — Other Ambulatory Visit (INDEPENDENT_AMBULATORY_CARE_PROVIDER_SITE_OTHER): Payer: Medicare Other

## 2018-07-17 DIAGNOSIS — E785 Hyperlipidemia, unspecified: Secondary | ICD-10-CM

## 2018-07-17 DIAGNOSIS — E039 Hypothyroidism, unspecified: Secondary | ICD-10-CM

## 2018-07-17 DIAGNOSIS — E1140 Type 2 diabetes mellitus with diabetic neuropathy, unspecified: Secondary | ICD-10-CM | POA: Diagnosis not present

## 2018-07-17 DIAGNOSIS — I10 Essential (primary) hypertension: Secondary | ICD-10-CM

## 2018-07-17 LAB — LIPID PANEL
Cholesterol: 122 mg/dL (ref 0–200)
HDL: 50.6 mg/dL (ref >39.00)
LDL Cholesterol: 57 mg/dL (ref 0–99)
NONHDL: 71.86
Total CHOL/HDL Ratio: 2
Triglycerides: 74 mg/dL (ref 0.0–149.0)
VLDL: 14.8 mg/dL (ref 0.0–40.0)

## 2018-07-17 LAB — COMPREHENSIVE METABOLIC PANEL
ALT: 22 U/L (ref 0–35)
AST: 23 U/L (ref 0–37)
Albumin: 4.1 g/dL (ref 3.5–5.2)
Alkaline Phosphatase: 57 U/L (ref 39–117)
BILIRUBIN TOTAL: 0.6 mg/dL (ref 0.2–1.2)
BUN: 11 mg/dL (ref 6–23)
CO2: 28 meq/L (ref 19–32)
Calcium: 9.5 mg/dL (ref 8.4–10.5)
Chloride: 102 mEq/L (ref 96–112)
Creatinine, Ser: 0.69 mg/dL (ref 0.40–1.20)
GFR: 107.32 mL/min (ref >60.00)
GLUCOSE: 111 mg/dL — AB (ref 70–99)
Potassium: 3.7 mEq/L (ref 3.5–5.1)
SODIUM: 138 meq/L (ref 135–145)
Total Protein: 6.9 g/dL (ref 6.0–8.3)

## 2018-07-17 LAB — HEMOGLOBIN A1C: HEMOGLOBIN A1C: 6.6 % — AB (ref 4.6–6.5)

## 2018-07-17 LAB — TSH: TSH: 0.69 u[IU]/mL (ref 0.35–4.50)

## 2018-07-18 ENCOUNTER — Encounter (INDEPENDENT_AMBULATORY_CARE_PROVIDER_SITE_OTHER): Payer: Self-pay | Admitting: Gastroenterology

## 2018-07-18 ENCOUNTER — Other Ambulatory Visit: Payer: Self-pay

## 2018-07-18 ENCOUNTER — Encounter: Payer: Self-pay | Admitting: Gastroenterology

## 2018-07-18 DIAGNOSIS — Z1211 Encounter for screening for malignant neoplasm of colon: Secondary | ICD-10-CM

## 2018-07-18 DIAGNOSIS — D369 Benign neoplasm, unspecified site: Secondary | ICD-10-CM

## 2018-07-18 MED ORDER — PEG 3350-KCL-NABCB-NACL-NASULF 236 G PO SOLR: 4000.0000 mL | Freq: Once | ORAL | 0 refills | Status: AC

## 2018-07-22 NOTE — Progress Notes (Signed)
error 

## 2018-07-25 ENCOUNTER — Other Ambulatory Visit: Payer: Self-pay | Admitting: Family

## 2018-07-25 DIAGNOSIS — E1140 Type 2 diabetes mellitus with diabetic neuropathy, unspecified: Secondary | ICD-10-CM

## 2018-08-09 ENCOUNTER — Encounter: Payer: Self-pay | Admitting: Student

## 2018-08-10 ENCOUNTER — Ambulatory Visit
Admission: RE | Admit: 2018-08-10 | Discharge: 2018-08-10 | Disposition: A | Discharge status: Home / Self Care | Payer: Medicare Other | Source: Ambulatory Visit | Attending: Gastroenterology | Admitting: Gastroenterology

## 2018-08-10 ENCOUNTER — Ambulatory Visit: Payer: Medicare Other | Admitting: Certified Registered Nurse Anesthetist

## 2018-08-10 ENCOUNTER — Encounter
Admission: RE | Disposition: A | Discharge status: Home / Self Care | Payer: Self-pay | Source: Ambulatory Visit | Attending: Gastroenterology

## 2018-08-10 ENCOUNTER — Encounter: Payer: Self-pay | Admitting: *Deleted

## 2018-08-10 DIAGNOSIS — E079 Disorder of thyroid, unspecified: Secondary | ICD-10-CM | POA: Diagnosis not present

## 2018-08-10 DIAGNOSIS — D122 Benign neoplasm of ascending colon: Secondary | ICD-10-CM

## 2018-08-10 DIAGNOSIS — Z86010 Personal history of colonic polyps: Secondary | ICD-10-CM

## 2018-08-10 DIAGNOSIS — Z1211 Encounter for screening for malignant neoplasm of colon: Secondary | ICD-10-CM | POA: Diagnosis present

## 2018-08-10 DIAGNOSIS — R911 Solitary pulmonary nodule: Secondary | ICD-10-CM | POA: Diagnosis not present

## 2018-08-10 DIAGNOSIS — J449 Chronic obstructive pulmonary disease, unspecified: Secondary | ICD-10-CM | POA: Diagnosis not present

## 2018-08-10 DIAGNOSIS — E785 Hyperlipidemia, unspecified: Secondary | ICD-10-CM | POA: Insufficient documentation

## 2018-08-10 DIAGNOSIS — Z791 Long term (current) use of non-steroidal anti-inflammatories (NSAID): Secondary | ICD-10-CM | POA: Diagnosis not present

## 2018-08-10 DIAGNOSIS — Z79899 Other long term (current) drug therapy: Secondary | ICD-10-CM | POA: Insufficient documentation

## 2018-08-10 DIAGNOSIS — D125 Benign neoplasm of sigmoid colon: Secondary | ICD-10-CM | POA: Diagnosis not present

## 2018-08-10 DIAGNOSIS — E119 Type 2 diabetes mellitus without complications: Secondary | ICD-10-CM | POA: Insufficient documentation

## 2018-08-10 DIAGNOSIS — Z7982 Long term (current) use of aspirin: Secondary | ICD-10-CM | POA: Insufficient documentation

## 2018-08-10 DIAGNOSIS — Z87891 Personal history of nicotine dependence: Secondary | ICD-10-CM | POA: Diagnosis not present

## 2018-08-10 DIAGNOSIS — M1990 Unspecified osteoarthritis, unspecified site: Secondary | ICD-10-CM | POA: Insufficient documentation

## 2018-08-10 DIAGNOSIS — Z7984 Long term (current) use of oral hypoglycemic drugs: Secondary | ICD-10-CM | POA: Diagnosis not present

## 2018-08-10 DIAGNOSIS — K635 Polyp of colon: Secondary | ICD-10-CM | POA: Diagnosis not present

## 2018-08-10 DIAGNOSIS — D369 Benign neoplasm, unspecified site: Secondary | ICD-10-CM | POA: Diagnosis not present

## 2018-08-10 DIAGNOSIS — I10 Essential (primary) hypertension: Secondary | ICD-10-CM | POA: Insufficient documentation

## 2018-08-10 DIAGNOSIS — I2510 Atherosclerotic heart disease of native coronary artery without angina pectoris: Secondary | ICD-10-CM | POA: Insufficient documentation

## 2018-08-10 HISTORY — PX: COLONOSCOPY WITH PROPOFOL: SHX5780

## 2018-08-10 LAB — GLUCOSE, CAPILLARY: Glucose-Capillary: 116 mg/dL — ABNORMAL HIGH (ref 70–99)

## 2018-08-10 SURGERY — COLONOSCOPY WITH PROPOFOL
Anesthesia: General

## 2018-08-10 MED ORDER — LIDOCAINE HCL (PF) 1 % IJ SOLN
INTRAMUSCULAR | Status: AC
  Administered 2018-08-10: 0.3 mL
  Filled 2018-08-10: qty 2

## 2018-08-10 MED ORDER — PROPOFOL 500 MG/50ML IV EMUL
INTRAVENOUS | Status: DC | PRN
  Administered 2018-08-10: 180 ug/kg/min via INTRAVENOUS

## 2018-08-10 MED ORDER — PROPOFOL 10 MG/ML IV BOLUS
INTRAVENOUS | Status: DC | PRN
  Administered 2018-08-10: 100 mg via INTRAVENOUS

## 2018-08-10 MED ORDER — FENTANYL CITRATE (PF) 100 MCG/2ML IJ SOLN
INTRAMUSCULAR | Status: DC | PRN
  Administered 2018-08-10 (×2): 50 ug via INTRAVENOUS

## 2018-08-10 MED ORDER — PROPOFOL 500 MG/50ML IV EMUL
INTRAVENOUS | Status: AC
  Filled 2018-08-10: qty 50

## 2018-08-10 MED ORDER — SODIUM CHLORIDE 0.9 % IV SOLN
INTRAVENOUS | Status: DC
  Administered 2018-08-10: 1000 mL via INTRAVENOUS

## 2018-08-10 MED ORDER — FENTANYL CITRATE (PF) 100 MCG/2ML IJ SOLN
INTRAMUSCULAR | Status: AC
  Filled 2018-08-10: qty 2

## 2018-08-10 MED ORDER — LIDOCAINE 2% (20 MG/ML) 5 ML SYRINGE
INTRAMUSCULAR | Status: DC | PRN
  Administered 2018-08-10: 40 mg via INTRAVENOUS

## 2018-08-10 NOTE — Anesthesia Post-op Follow-up Note (Signed)
Anesthesia QCDR form completed.        

## 2018-08-10 NOTE — Op Note (Signed)
[facility] Gastroenterology Patient Name: Cynthia [name] Procedure Date: 08/10/2018 11:56 AM MRN: [id number] Account #: [id number] Date of Birth: 1945-11-09 Admit Type: Outpatient Age: 72 Room: [facility] ENDO ROOM 4 Gender: Female Note Status: Finalized Procedure:            Colonoscopy Indications:          High risk colon cancer surveillance: Personal history                        of colonic polyps Providers:            [name] MD, MD Referring MD:         Yvetta Coder. Arnett (Referring MD) Medicines:            Monitored Anesthesia Care Complications:        No immediate complications. Procedure:            Pre-Anesthesia Assessment:                       - ASA Grade Assessment: II - A patient with mild                        systemic disease.                       After obtaining informed consent, the colonoscope was                        passed under direct vision. Throughout the procedure,                        the patient's blood pressure, pulse, and oxygen                        saturations were monitored continuously. The                        Colonoscope was introduced through the anus and                        advanced to the the cecum, identified by the                        appendiceal orifice, IC valve and transillumination.                        The colonoscopy was performed with ease. The patient                        tolerated the procedure well. The quality of the bowel                        preparation was good. Findings:      The perianal and digital rectal examinations were normal.      Four sessile polyps were found in the ascending colon. The polyps were 3       to 4 mm in size. These polyps were removed with a cold biopsy forceps.       Resection and retrieval were complete.      A 3 mm polyp was found in the sigmoid colon. The polyp was sessile.  The       polyp was removed with a cold biopsy forceps. Resection and retrieval    were complete.      The exam was otherwise without abnormality on direct and retroflexion       views. Impression:           - Four 3 to 4 mm polyps in the ascending colon, removed                        with a cold biopsy forceps. Resected and retrieved.                       - One 3 mm polyp in the sigmoid colon, removed with a                        cold biopsy forceps. Resected and retrieved.                       - The examination was otherwise normal on direct and                        retroflexion views. Recommendation:       - Discharge patient to home (with escort).                       - Resume previous diet.                       - Continue present medications.                       - Await pathology results.                       - Repeat colonoscopy for surveillance based on                        pathology results. Procedure Code(s):    --- Professional ---                       [phone], Colonoscopy, flexible; with biopsy, single or                        multiple Diagnosis Code(s):    --- Professional ---                       Z86.010, Personal history of colonic polyps                       D12.2, Benign neoplasm of ascending colon                       D12.5, Benign neoplasm of sigmoid colon CPT copyright 2018 American Medical Association. All rights reserved. The codes documented in this report are preliminary and upon coder review may  be revised to meet current compliance requirements. [name], MD [name] MD, MD 08/10/2018 12:29:07 PM This report has been signed electronically. Number of Addenda: 0 Note Initiated On: 08/10/2018 11:56 AM Scope Withdrawal Time: 0 hours 10 minutes 35 seconds  Total Procedure Duration: 0 hours 15 minutes 55 seconds       [place]  [facility]

## 2018-08-10 NOTE — Transfer of Care (Signed)
Immediate Anesthesia Transfer of Care Note  Patient: Cynthia [name]  Procedure(s) Performed: COLONOSCOPY WITH PROPOFOL (N/A )  Patient Location: PACU and Endoscopy Unit  Anesthesia Type:General  Level of Consciousness: awake and drowsy  Airway & Oxygen Therapy: Patient Spontanous Breathing and Patient connected to face mask oxygen  Post-op Assessment: Report given to RN and Post -op Vital signs reviewed and stable  Post vital signs: Reviewed and stable  Last Vitals:  Vitals Value Taken Time  BP 100/67 08/10/2018 12:33 PM  Temp 36.1 C 08/10/2018 12:33 PM  Pulse 73 08/10/2018 12:34 PM  Resp 16 08/10/2018 12:33 PM  SpO2 100 % 08/10/2018 12:34 PM  Vitals shown include unvalidated device data.  Last Pain:  Vitals:   08/10/18 1233  TempSrc: Tympanic  PainSc:          Complications: No apparent anesthesia complications

## 2018-08-10 NOTE — H&P (Signed)
[name], MD [address], [place], [place], [place], [address] [address], [place], [place], [place], [address] Phone: [phone]  Fax: [phone]  Primary Care Physician:  Burnard Hawthorne, FNP   Pre-Procedure History & Physical: HPI:  Cynthia [name] is a 72 y.o. female is here for an colonoscopy.   Past Medical History:  Diagnosis Date  . Arthritis   . 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   Marland Kitchen COPD (chronic obstructive pulmonary disease) ([place])   . Coronary artery disease   . Diabetes mellitus without complication ([place])   . Hyperlipidemia   . Hypertension   . Lung nodule   . Thyroid disease     Past Surgical History:  Procedure Laterality Date  . BREAST BIOPSY Left 11/25/2015   stereo  neg  . BREAST EXCISIONAL BIOPSY Left yrs ago   benign  . COLONOSCOPY WITH PROPOFOL N/A 04/20/2017   Procedure: COLONOSCOPY WITH PROPOFOL;  Surgeon: [name], MD;  Location: [facility] ENDOSCOPY;  Service: Endoscopy;  Laterality: N/A;  . COLONOSCOPY WITH PROPOFOL N/A 01/08/2018   Procedure: COLONOSCOPY WITH PROPOFOL;  Surgeon: [name], MD;  Location: [facility] ENDOSCOPY;  Service: Gastroenterology;  Laterality: N/A;  . THYROIDECTOMY     Dr. [name]  . VAGINAL DELIVERY     4    Prior to Admission medications   Medication Sig Start Date End Date Taking? Authorizing Provider  amLODipine (NORVASC) 10 MG tablet TAKE 1 TABLET (10 MG TOTAL) BY MOUTH DAILY. 02/05/18  Yes Burnard Hawthorne, FNP  aspirin 81 MG tablet Take 81 mg by mouth daily.   Yes [provider]  Aspirin-Calcium Carbonate 81-777 MG TABS Take by mouth.   Yes [provider]  atorvastatin (LIPITOR) 20 MG tablet TAKE 1 TABLET (20 MG TOTAL) BY MOUTH DAILY. 07/11/18  Yes Burnard Hawthorne, FNP  Calcium Carbonate-Vit D-Min (CALCIUM 600+D PLUS MINERALS) 600-400 MG-UNIT TABS Take by mouth.   Yes [provider]  Calcium Carbonate-Vitamin D 600-400 MG-UNIT per tablet Take 1 tablet by mouth  daily.   Yes [provider]  gabapentin (NEURONTIN) 100 MG capsule TAKE ONE CAPSULE BY MOUTH 3 TIMES A DAY 02/05/18  Yes Arnett, Yvetta Coder, FNP  glucose blood (ONE TOUCH ULTRA TEST) test strip TEST BLOOD SUGAR 1-2 TIMES A DAY 07/25/18  Yes Arnett, Yvetta Coder, FNP  levothyroxine (SYNTHROID, LEVOTHROID) 137 MCG tablet TAKE 1 TABLET (137 MCG TOTAL) BY MOUTH DAILY BEFORE BREAKFAST. 07/10/17  Yes Arnett, Yvetta Coder, FNP  losartan-hydrochlorothiazide (HYZAAR) 50-12.5 MG tablet Take 1 tablet by mouth daily. 02/05/18  Yes Burnard Hawthorne, FNP  meloxicam (MOBIC) 15 MG tablet Take 1 tablet (15 mg total) by mouth daily. As needed 03/13/18  Yes McLean-Scocuzza, Nino Glow, MD  metFORMIN (GLUCOPHAGE) 500 MG tablet TAKE 1 TABLET (500 MG TOTAL) BY MOUTH 2 (TWO) TIMES DAILY WITH A MEAL. 06/18/18  Yes Burnard Hawthorne, FNP  metoprolol tartrate (LOPRESSOR) 25 MG tablet Take 1 tablet (25 mg total) by mouth 2 (two) times daily. 02/05/18  Yes Burnard Hawthorne, FNP  omeprazole (PRILOSEC) 20 MG capsule TAKE 1 CAPSULE BY MOUTH EVERY DAY 07/11/18  Yes Arnett, Yvetta Coder, FNP  ONETOUCH DELICA LANCETS FINE MISC CHECK BLOOD GLUCOSE TWICE A DAY 04/09/18  Yes Einar Pheasant, MD  triamcinolone cream (KENALOG) 0.1 % Apply 1 application topically 2 (two) times daily. 05/14/18  Yes Guse, Jacquelynn Cree, FNP  albuterol (PROVENTIL HFA;VENTOLIN HFA) 108 (90 Base) MCG/ACT inhaler Inhale 1-2 puffs  into the lungs every 6 (six) hours as needed for wheezing or shortness of breath. 03/13/18   McLean-Scocuzza, Nino Glow, MD    Allergies as of 07/18/2018  . (No Known Allergies)    Family History  Problem Relation Age of Onset  . Hypertension Mother   . Hypertension Father   . Diabetes Sister   . Thyroid disease Sister   . Breast cancer Maternal Aunt   . Breast cancer Maternal Aunt     Social History   Socioeconomic History  . Marital status: Married    Spouse name: Not on file  . Number of children: 4  . Years of education: Not  on file  . Highest education level: Not on file  Occupational History  . Not on file  Social Needs  . Financial resource strain: Not hard at all  . Food insecurity:    Worry: Never true    Inability: Never true  . Transportation needs:    Medical: No    Non-medical: No  Tobacco Use  . Smoking status: Former Smoker    Packs/day: 1.00    Years: 20.00    Pack years: 20.00    Types: Cigarettes    Last attempt to quit: 11/17/2012    Years since quitting: 5.7  . Smokeless tobacco: Former Systems developer    Types: Snuff, Chew  Substance and Sexual Activity  . Alcohol use: No  . Drug use: No  . Sexual activity: Never  Lifestyle  . Physical activity:    Days per week: Not on file    Minutes per session: Not on file  . Stress: Not on file  Relationships  . Social connections:    Talks on phone: Not on file    Gets together: Not on file    Attends religious service: Not on file    Active member of club or organization: Not on file    Attends meetings of clubs or organizations: Not on file    Relationship status: Not on file  . Intimate partner violence:    Fear of current or ex partner: No    Emotionally abused: No    Physically abused: No    Forced sexual activity: No  Other Topics Concern  . Not on file  Social History Narrative   Lives in [place] with husband. Has 4 children.      4 grandchildren.      Work - Retired from [employer]- walking the track, 4x per week      Diet- regular       Review of Systems: See HPI, otherwise negative ROS  Physical Exam: BP (!) 127/94   Pulse 76   Temp 97.6 F (36.4 C) (Tympanic)   Resp 18   Ht 5\' 8"  (1.727 m)   Wt 106.1 kg   SpO2 96%   BMI 35.58 kg/m  General:   Alert,  pleasant and cooperative in NAD Head:  Normocephalic and atraumatic. Neck:  Supple; no masses or thyromegaly. Lungs:  Clear throughout to auscultation, normal respiratory effort.    Heart:  +S1, +S2, Regular rate and rhythm, No  edema. Abdomen:  Soft, nontender and nondistended. Normal bowel sounds, without guarding, and without rebound.   Neurologic:  Alert and  oriented x4;  grossly normal neurologically.  Impression/Plan: [name] is here for an colonoscopy to be performed for surveillance due to prior history of colon polyps   Risks, benefits, limitations, and alternatives regarding  colonoscopy have been reviewed with the patient.  Questions have been answered.  All parties agreeable.   [name], MD  08/10/2018, 11:56 AM

## 2018-08-10 NOTE — Anesthesia Preprocedure Evaluation (Signed)
Anesthesia Evaluation  Patient identified by MRN, date of birth, ID band Patient awake    Reviewed: Allergy & Precautions, NPO status , Patient's Chart, lab work & pertinent test results  History of Anesthesia Complications Negative for: history of anesthetic complications  Airway Mallampati: III  TM Distance: >3 FB Neck ROM: Full    Dental no notable dental hx.    Pulmonary neg sleep apnea, COPD,  COPD inhaler, former smoker,    breath sounds clear to auscultation- rhonchi (-) wheezing      Cardiovascular hypertension, Pt. 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  No date: COPD (chronic obstructive pulmonary disease) (H[facility]) No date: Coronary artery disease No date: Diabetes mellitus without complication (H[facility]) No date: Hyperlipidemia No date: Hypertension No date: Lung nodule No date: Thyroid disease   Reproductive/Obstetrics                             Anesthesia Physical Anesthesia Plan  ASA: II  Anesthesia Plan: General   Post-op Pain Management:    Induction: Intravenous  PONV Risk Score and Plan: 2 and Propofol infusion  Airway Management Planned: Natural Airway  Additional Equipment:   Intra-op Plan:   Post-operative Plan:   Informed Consent: I have reviewed the patients History and Physical, chart, labs and discussed the procedure  including the risks, benefits and alternatives for the proposed anesthesia with the patient or authorized representative who has indicated his/her understanding and acceptance.   Dental advisory given  Plan Discussed with: CRNA and Anesthesiologist  Anesthesia Plan Comments:         Anesthesia Quick Evaluation

## 2018-08-12 NOTE — Progress Notes (Signed)
Voicemail.  No Message Left. 

## 2018-08-13 ENCOUNTER — Encounter: Payer: Self-pay | Admitting: Gastroenterology

## 2018-08-14 LAB — SURGICAL PATHOLOGY

## 2018-08-14 NOTE — Anesthesia Postprocedure Evaluation (Signed)
Anesthesia Post Note  Patient: Cynthia [name]  Procedure(s) Performed: COLONOSCOPY WITH PROPOFOL (N/A )  Patient location during evaluation: PACU Anesthesia Type: General Level of consciousness: awake and alert Pain management: pain level controlled Vital Signs Assessment: post-procedure vital signs reviewed and stable Respiratory status: spontaneous breathing, nonlabored ventilation and respiratory function stable Cardiovascular status: blood pressure returned to baseline and stable Postop Assessment: no apparent nausea or vomiting Anesthetic complications: no     Last Vitals:  Vitals:   08/10/18 1253 08/10/18 1303  BP:  121/72  Pulse: 70 66  Resp: (!) 27 16  Temp:    SpO2: 99% 96%    Last Pain:  Vitals:   08/10/18 1303  TempSrc:   PainSc: 0-No pain                 Durenda Hurt

## 2018-08-17 ENCOUNTER — Ambulatory Visit: Payer: Self-pay | Admitting: Oncology

## 2018-08-17 ENCOUNTER — Other Ambulatory Visit: Payer: Self-pay

## 2018-08-20 ENCOUNTER — Encounter: Payer: Self-pay | Admitting: Oncology

## 2018-08-20 ENCOUNTER — Inpatient Hospital Stay: Payer: Medicare Other | Attending: Oncology | Admitting: Oncology

## 2018-08-20 ENCOUNTER — Inpatient Hospital Stay (HOSPITAL_BASED_OUTPATIENT_CLINIC_OR_DEPARTMENT_OTHER): Payer: Medicare Other | Admitting: Oncology

## 2018-08-20 ENCOUNTER — Other Ambulatory Visit: Payer: Self-pay

## 2018-08-20 VITALS — BP 123/78 | HR 79 | Temp 96.6°F | Resp 18 | Wt 240.2 lb

## 2018-08-20 DIAGNOSIS — E119 Type 2 diabetes mellitus without complications: Secondary | ICD-10-CM

## 2018-08-20 DIAGNOSIS — Z79899 Other long term (current) drug therapy: Secondary | ICD-10-CM

## 2018-08-20 DIAGNOSIS — Z87891 Personal history of nicotine dependence: Secondary | ICD-10-CM | POA: Diagnosis not present

## 2018-08-20 DIAGNOSIS — I10 Essential (primary) hypertension: Secondary | ICD-10-CM

## 2018-08-20 DIAGNOSIS — Z7982 Long term (current) use of aspirin: Secondary | ICD-10-CM

## 2018-08-20 DIAGNOSIS — Z7984 Long term (current) use of oral hypoglycemic drugs: Secondary | ICD-10-CM

## 2018-08-20 DIAGNOSIS — C7A022 Malignant carcinoid tumor of the ascending colon: Secondary | ICD-10-CM | POA: Insufficient documentation

## 2018-08-20 DIAGNOSIS — C7A8 Other malignant neuroendocrine tumors: Secondary | ICD-10-CM

## 2018-08-20 LAB — COMPREHENSIVE METABOLIC PANEL
ALBUMIN: 4.2 g/dL (ref 3.5–5.0)
ALT: 28 U/L (ref 0–44)
AST: 30 U/L (ref 15–41)
Alkaline Phosphatase: 61 U/L (ref 38–126)
Anion gap: 9 (ref 5–15)
BUN: 17 mg/dL (ref 8–23)
CHLORIDE: 101 mmol/L (ref 98–111)
CO2: 29 mmol/L (ref 22–32)
CREATININE: 0.7 mg/dL (ref 0.44–1.00)
Calcium: 9.9 mg/dL (ref 8.9–10.3)
GFR calc non Af Amer: >60 mL/min (ref >60)
GLUCOSE: 112 mg/dL — AB (ref 70–99)
Potassium: 4.1 mmol/L (ref 3.5–5.1)
SODIUM: 139 mmol/L (ref 135–145)
Total Bilirubin: 0.6 mg/dL (ref 0.3–1.2)
Total Protein: 7.6 g/dL (ref 6.5–8.1)

## 2018-08-20 LAB — CBC WITH DIFFERENTIAL/PLATELET
Abs Immature Granulocytes: 0 10*3/uL (ref 0.00–0.07)
BASOS ABS: 0 10*3/uL (ref 0.0–0.1)
BASOS PCT: 1 %
EOS ABS: 0.2 10*3/uL (ref 0.0–0.5)
Eosinophils Relative: 5 %
HCT: 41.8 % (ref 36.0–46.0)
Hemoglobin: 14.1 g/dL (ref 12.0–15.0)
IMMATURE GRANULOCYTES: 0 %
Lymphocytes Relative: 36 %
Lymphs Abs: 1.4 10*3/uL (ref 0.7–4.0)
MCH: 31.3 pg (ref 26.0–34.0)
MCHC: 33.7 g/dL (ref 30.0–36.0)
MCV: 92.7 fL (ref 80.0–100.0)
Monocytes Absolute: 0.7 10*3/uL (ref 0.1–1.0)
Monocytes Relative: 17 %
NEUTROS PCT: 41 %
NRBC: 0 % (ref 0.0–0.2)
Neutro Abs: 1.6 10*3/uL — ABNORMAL LOW (ref 1.7–7.7)
PLATELETS: 263 10*3/uL (ref 150–400)
RBC: 4.51 MIL/uL (ref 3.87–5.11)
RDW: 13.2 % (ref 11.5–15.5)
WBC: 3.9 10*3/uL — ABNORMAL LOW (ref 4.0–10.5)

## 2018-08-20 NOTE — Progress Notes (Signed)
Hematology/Oncology follow up note [facility] [phone] [phone]   Patient Care Team: Burnard Hawthorne, FNP as PCP - General (Family Medicine) Jackolyn Confer, MD (Internal Medicine) Clent Jacks, RN as Registered Nurse  REFERRING PROVIDER: Gastroenterology: Napoleon Form. REASON FOR VISIT Follow up for management of neuroendocrine cancer of the colon and discussion about dotatate PET scan results.  HISTORY OF PRESENTING ILLNESS:  Cynthia [name] is a  72 y.o.  female with PMH listed below who was referred to me for evaluation of neuroendocrine cancer  She recently have screening colonoscopy done and was found to have 3 ascending colon polyps and 6 sigmoid colon polyps, all removed and retrieved. All three ascending colon polyps were tubular adenoma. 5 sigmoid colon polyps were hyperplastic polyps. One polyp turned out to be well defferentiated neuroendocrine tumor, 2.82mm, grade 1.   INTERVAL HISTORY Cynthia [name] is a 72 y.o. female who has above history reviewed by me today presents for follow up of neuroendocrine cancer of colon.  S/p repeat colonoscopy on 08/10/2018. Polyps resected. Pathology negative malignancy.  Denies any diarrhea, flushes, abdominal pain, blood in stool.  Appetite is good.   Review of Systems  Constitutional: Negative for chills, fever, malaise/fatigue and weight loss.  HENT: Negative for congestion, ear discharge, ear pain, nosebleeds, sinus pain and sore throat.   Eyes: Negative for double vision, photophobia, pain, discharge and redness.  Respiratory: Negative for cough, hemoptysis, sputum production, shortness of breath and wheezing.   Cardiovascular: Negative for chest pain, palpitations, orthopnea, claudication and leg swelling.  Gastrointestinal: Negative for abdominal pain, blood in stool, constipation, diarrhea, heartburn, melena, nausea and vomiting.  Genitourinary: Negative for dysuria, flank  pain, frequency and hematuria.  Musculoskeletal: Negative for back pain, myalgias and neck pain.  Skin: Negative for itching and rash.  Neurological: Negative for dizziness, tingling, tremors, focal weakness, weakness and headaches.  Endo/Heme/Allergies: Negative for environmental allergies. Does not bruise/bleed easily.  Psychiatric/Behavioral: Negative for depression and hallucinations. The patient is not nervous/anxious.     MEDICAL HISTORY:  Past Medical History:  Diagnosis Date  . Arthritis   . 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   Marland Kitchen COPD (chronic obstructive pulmonary disease) ([place])   . Coronary artery disease   . Diabetes mellitus without complication ([place])   . Hyperlipidemia   . Hypertension   . Lung nodule   . Thyroid disease     SURGICAL HISTORY: Past Surgical History:  Procedure Laterality Date  . BREAST BIOPSY Left 11/25/2015   stereo  neg  . BREAST EXCISIONAL BIOPSY Left yrs ago   benign  . COLONOSCOPY WITH PROPOFOL N/A 04/20/2017   Procedure: COLONOSCOPY WITH PROPOFOL;  Surgeon: [name], MD;  Location: [facility] ENDOSCOPY;  Service: Endoscopy;  Laterality: N/A;  . COLONOSCOPY WITH PROPOFOL N/A 01/08/2018   Procedure: COLONOSCOPY WITH PROPOFOL;  Surgeon: [name], MD;  Location[facility] ENDOSCOPY;  Service: Gastroenterology;  Laterality: N/A;  . COLONOSCOPY WITH PROPOFOL N/A 08/10/2018   Procedure: COLONOSCOPY WITH PROPOFOL;  Surgeon: [name], MD;  Location: [facility] ENDOSCOPY;  Service: Gastroenterology;  Laterality: N/A;  . THYROIDECTOMY     Dr. [name]  . VAGINAL DELIVERY     4    SOCIAL HISTORY: Social History   Socioeconomic History  . Marital status: Married    Spouse name: Not on file  . Number of children: 4  . Years of education: Not on file  . Highest education level: Not on file  Occupational History  . Not on file  Social Needs  . Financial resource strain: Not hard at all  . Food insecurity:    Worry: Never true    Inability:  Never true  . Transportation needs:    Medical: No    Non-medical: No  Tobacco Use  . Smoking status: Former Smoker    Packs/day: 1.00    Years: 20.00    Pack years: 20.00    Types: Cigarettes    Last attempt to quit: 11/17/2012    Years since quitting: 5.7  . Smokeless tobacco: Former Systems developer    Types: Snuff, Chew  Substance and Sexual Activity  . Alcohol use: No  . Drug use: No  . Sexual activity: Never  Lifestyle  . Physical activity:    Days per week: Not on file    Minutes per session: Not on file  . Stress: Not on file  Relationships  . Social connections:    Talks on phone: Not on file    Gets together: Not on file    Attends religious service: Not on file    Active member of club or organization: Not on file    Attends meetings of clubs or organizations: Not on file    Relationship status: Not on file  . Intimate partner violence:    Fear of current or ex partner: No    Emotionally abused: No    Physically abused: No    Forced sexual activity: No  Other Topics Concern  . Not on file  Social History Narrative   Lives in [place] with husband. Has 4 children.      4 grandchildren.      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 Hypertension Mother   . Hypertension Father   . Diabetes Sister   . Thyroid disease Sister   . Breast cancer Maternal Aunt   . Breast cancer Maternal Aunt     ALLERGIES:  has No Known Allergies.  MEDICATIONS:  Current Outpatient Medications  Medication Sig Dispense Refill  . albuterol (PROVENTIL HFA;VENTOLIN HFA) 108 (90 Base) MCG/ACT inhaler Inhale 1-2 puffs into the lungs every 6 (six) hours as needed for wheezing or shortness of breath. 1 Inhaler 2  . amLODipine (NORVASC) 10 MG tablet TAKE 1 TABLET (10 MG TOTAL) BY MOUTH DAILY. 90 tablet 3  . aspirin 81 MG tablet Take 81 mg by mouth daily.    .  Aspirin-Calcium Carbonate 81-777 MG TABS Take by mouth.    Marland Kitchen atorvastatin (LIPITOR) 20 MG tablet TAKE 1 TABLET (20 MG TOTAL) BY MOUTH DAILY. 90 tablet 0  . Calcium Carbonate-Vit D-Min (CALCIUM 600+D PLUS MINERALS) 600-400 MG-UNIT TABS Take by mouth.    . gabapentin (NEURONTIN) 100 MG capsule TAKE ONE CAPSULE BY MOUTH 3 TIMES A DAY 90 capsule 6  . glucose blood (ONE TOUCH ULTRA TEST) test strip TEST BLOOD SUGAR 1-2 TIMES A DAY 100 each 4  . levothyroxine (SYNTHROID, LEVOTHROID) 137 MCG tablet TAKE 1 TABLET (137 MCG TOTAL) BY MOUTH DAILY BEFORE BREAKFAST. 90 tablet 1  . losartan-hydrochlorothiazide (HYZAAR) 50-12.5 MG tablet Take 1 tablet by mouth daily. 90 tablet 1  . meloxicam (MOBIC) 15 MG tablet Take 1 tablet (15 mg total) by mouth daily. As needed 90 tablet 1  .  metFORMIN (GLUCOPHAGE) 500 MG tablet TAKE 1 TABLET (500 MG TOTAL) BY MOUTH 2 (TWO) TIMES DAILY WITH A MEAL. 180 tablet 1  . metoprolol tartrate (LOPRESSOR) 25 MG tablet Take 1 tablet (25 mg total) by mouth 2 (two) times daily. 90 tablet 1  . omeprazole (PRILOSEC) 20 MG capsule TAKE 1 CAPSULE BY MOUTH EVERY DAY 90 capsule 1  . ONETOUCH DELICA LANCETS FINE MISC CHECK BLOOD GLUCOSE TWICE A DAY 100 each 3  . Calcium Carbonate-Vitamin D 600-400 MG-UNIT per tablet Take 1 tablet by mouth daily.    Marland Kitchen triamcinolone cream (KENALOG) 0.1 % Apply 1 application topically 2 (two) times daily. (Patient not taking: Reported on 08/20/2018) 30 g 0   No current facility-administered medications for this visit.      PHYSICAL EXAMINATION: ECOG PERFORMANCE STATUS: 0 - Asymptomatic Vitals:   08/20/18 1052  BP: 123/78  Pulse: 79  Resp: 18  Temp: (!) 96.6 F (35.9 C)   Filed Weights   08/20/18 1052  Weight: 240 lb 3.2 oz (109 kg)    Physical Exam  Constitutional: She is oriented to person, place, and time. She appears well-developed. No distress.  HENT:  Head: Normocephalic and atraumatic.  Mouth/Throat: Oropharynx is clear and moist.  Eyes:  Pupils are equal, round, and reactive to light. Conjunctivae and EOM are normal. No scleral icterus.  Neck: Normal range of motion. Neck supple.  Cardiovascular: Normal rate, regular rhythm and normal heart sounds.  Pulmonary/Chest: Effort normal and breath sounds normal. No respiratory distress. She has no wheezes.  Abdominal: Soft. Bowel sounds are normal. She exhibits no distension.  Musculoskeletal: Normal range of motion. She exhibits no edema or deformity.  Lymphadenopathy:    She has no cervical adenopathy.  Neurological: She is alert and oriented to person, place, and time. No cranial nerve deficit. Coordination normal.  Skin: Skin is warm and dry.  Psychiatric: She has a normal mood and affect.     LABORATORY DATA:  I have reviewed the data as listed Lab Results  Component Value Date   WBC 3.9 (L) 08/20/2018   HGB 14.1 08/20/2018   HCT 41.8 08/20/2018   MCV 92.7 08/20/2018   PLT 263 08/20/2018   Recent Labs    01/23/18 1155 02/05/18 0940 07/17/18 0841 08/20/18 1026  NA 138 137 138 139  K 3.7 3.6 3.7 4.1  CL 102 101 102 101  CO2 25 28 28 29   GLUCOSE 86 179* 111* 112*  BUN 12 12 11 17   CREATININE 0.75 0.75 0.69 0.70  CALCIUM 9.9 9.4 9.5 9.9  GFRNONAA >60  --   --  >60  GFRAA >60  --   --  >60  PROT 7.7  --  6.9 7.6  ALBUMIN 4.2  --  4.1 4.2  AST 34  --  23 30  ALT 27  --  22 28  ALKPHOS 63  --  57 61  BILITOT 0.7  --  0.6 0.6    Serum serotonin level at 111 within normal range 5 HIAA urine test was obtained and patient has not gotten done yet.   ASSESSMENT & PLAN:  1. Neuroendocrine carcinoma of colon ([place])    # recent repeat colonoscopy was negative.  Repeat 5 HIAA, chromogranin A, pending.  CBC and CMP were reviewed and discussed with patient.   All questions were answered. The patient knows to call the clinic with any problems questions or concerns.  Return of visit: 6 months.    Talbert Cage  Tasia Catchings, MD, PhD Hematology Oncology [facility]  at [facility] Pager- [phone] 08/20/2018

## 2018-08-20 NOTE — Progress Notes (Signed)
Patient here for follow up. No concerns voiced.  °

## 2018-08-22 ENCOUNTER — Encounter: Payer: Self-pay | Admitting: Gastroenterology

## 2018-08-22 LAB — CHROMOGRANIN A: Chromogranin A: 1 nmol/L (ref 0–5)

## 2018-08-30 ENCOUNTER — Other Ambulatory Visit: Payer: Self-pay

## 2018-08-30 DIAGNOSIS — E039 Hypothyroidism, unspecified: Secondary | ICD-10-CM

## 2018-08-30 MED ORDER — LEVOTHYROXINE SODIUM 137 MCG PO TABS: 137.0000 ug | ORAL_TABLET | Freq: Every day | ORAL | 1 refills | Status: DC

## 2018-09-07 ENCOUNTER — Other Ambulatory Visit: Payer: Self-pay | Admitting: Family

## 2018-09-07 DIAGNOSIS — M1990 Unspecified osteoarthritis, unspecified site: Secondary | ICD-10-CM

## 2018-09-07 DIAGNOSIS — I10 Essential (primary) hypertension: Secondary | ICD-10-CM

## 2018-10-01 ENCOUNTER — Other Ambulatory Visit: Payer: Self-pay | Admitting: Family

## 2018-10-01 DIAGNOSIS — I10 Essential (primary) hypertension: Secondary | ICD-10-CM

## 2018-10-01 MED ORDER — LOSARTAN POTASSIUM-HCTZ 50-12.5 MG PO TABS: 1.0000 | ORAL_TABLET | Freq: Every day | ORAL | 1 refills | Status: DC

## 2018-10-01 NOTE — Telephone Encounter (Signed)
Copied from [place] [phone]. Topic: Quick Communication - Rx Refill/Question >> Oct 01, 2018  8:56 AM Waylan Rocher, L[place] L wrote: Medication: losartan-hydrochlorothiazide (HYZAAR) 50-12.5 MG tablet (2 pills)  Has the patient contacted their pharmacy? Yes.   (Agent: If no, request that the patient contact the pharmacy for the refill.) (Agent: If yes, when and what did the pharmacy advise?)  Preferred Pharmacy (with phone number or street name): [id number] - [place], [place] - [address] [address] Phone: [phone] Fax: [phone]  Agent: Please be advised that RX refills may take up to 3 business days. We ask that you follow-up with your pharmacy.

## 2018-10-01 NOTE — Telephone Encounter (Signed)
Requested Prescriptions  Pending Prescriptions Disp Refills  . losartan-hydrochlorothiazide (HYZAAR) 50-12.5 MG tablet 90 tablet 1    Sig: Take 1 tablet by mouth daily.     Cardiovascular: ARB + Diuretic Combos Passed - 10/01/2018 12:16 PM      Passed - K in normal range and within 180 days    Potassium  Date Value Ref Range Status  08/20/2018 4.1 3.5 - 5.1 mmol/L Final  01/09/2013 3.5 3.5 - 5.1 mmol/L Final         Passed - Na in normal range and within 180 days    Sodium  Date Value Ref Range Status  08/20/2018 139 135 - 145 mmol/L Final  01/09/2013 138 136 - 145 mmol/L Final         Passed - Cr in normal range and within 180 days    Creatinine  Date Value Ref Range Status  01/17/2013 0.55 (L) 0.60 - 1.30 mg/dL Final   Creatinine, Ser  Date Value Ref Range Status  08/20/2018 0.70 0.44 - 1.00 mg/dL Final         Passed - Ca in normal range and within 180 days    Calcium  Date Value Ref Range Status  08/20/2018 9.9 8.9 - 10.3 mg/dL Final   Calcium, Total  Date Value Ref Range Status  01/17/2013 9.5 8.5 - 10.1 mg/dL Final         Passed - Patient is not pregnant      Passed - Last BP in normal range    BP Readings from Last 1 Encounters:  08/20/18 123/78         Passed - Valid encounter within last 6 months    Recent Outpatient Visits          2 months ago Essential hypertension   [place], Yvetta Coder, FNP   4 months ago Acute eruption of skin   [facility] [place] Guse, Jacquelynn Cree, FNP   6 months ago COPD exacerbation [facility])   [place] McLean-Scocuzza, Nino Glow, MD   7 months ago Essential hypertension   [place], Yvetta Coder, FNP   9 months ago Mouth sore   [place], [place], [place]      Future Appointments            In 1 week [place], Yvetta Coder, [place], [place]   In 6 months [place], Yvetta Coder, [place]  [place], [place]   In 6 months [place], Bryson [place], [place], [facility]

## 2018-10-11 ENCOUNTER — Other Ambulatory Visit: Payer: Self-pay | Admitting: Family

## 2018-10-11 DIAGNOSIS — E1140 Type 2 diabetes mellitus with diabetic neuropathy, unspecified: Secondary | ICD-10-CM

## 2018-10-12 ENCOUNTER — Encounter: Payer: Self-pay | Admitting: Family

## 2018-10-12 ENCOUNTER — Other Ambulatory Visit: Payer: Self-pay

## 2018-10-12 ENCOUNTER — Ambulatory Visit (INDEPENDENT_AMBULATORY_CARE_PROVIDER_SITE_OTHER): Payer: Medicare Other | Admitting: Family

## 2018-10-12 ENCOUNTER — Telehealth: Payer: Self-pay | Admitting: Family

## 2018-10-12 VITALS — BP 130/84 | HR 84 | Temp 98.1°F | Wt 244.8 lb

## 2018-10-12 DIAGNOSIS — E1140 Type 2 diabetes mellitus with diabetic neuropathy, unspecified: Secondary | ICD-10-CM

## 2018-10-12 DIAGNOSIS — G8929 Other chronic pain: Secondary | ICD-10-CM

## 2018-10-12 DIAGNOSIS — M25562 Pain in left knee: Secondary | ICD-10-CM | POA: Diagnosis not present

## 2018-10-12 DIAGNOSIS — M25561 Pain in right knee: Secondary | ICD-10-CM | POA: Diagnosis not present

## 2018-10-12 DIAGNOSIS — I10 Essential (primary) hypertension: Secondary | ICD-10-CM

## 2018-10-12 DIAGNOSIS — K219 Gastro-esophageal reflux disease without esophagitis: Secondary | ICD-10-CM

## 2018-10-12 DIAGNOSIS — E039 Hypothyroidism, unspecified: Secondary | ICD-10-CM

## 2018-10-12 DIAGNOSIS — E785 Hyperlipidemia, unspecified: Secondary | ICD-10-CM

## 2018-10-12 LAB — BASIC METABOLIC PANEL
BUN: 13 mg/dL (ref 6–23)
CO2: 29 mEq/L (ref 19–32)
Calcium: 10.1 mg/dL (ref 8.4–10.5)
Chloride: 102 mEq/L (ref 96–112)
Creatinine, Ser: 0.72 mg/dL (ref 0.40–1.20)
GFR: 96.07 mL/min (ref >60.00)
Glucose, Bld: 111 mg/dL — ABNORMAL HIGH (ref 70–99)
Potassium: 4.6 mEq/L (ref 3.5–5.1)
Sodium: 140 mEq/L (ref 135–145)

## 2018-10-12 LAB — HEMOGLOBIN A1C: Hgb A1c MFr Bld: 6.6 % — ABNORMAL HIGH (ref 4.6–6.5)

## 2018-10-12 MED ORDER — LOSARTAN POTASSIUM-HCTZ 50-12.5 MG PO TABS: 1.0000 | ORAL_TABLET | Freq: Every day | ORAL | 1 refills | Status: DC

## 2018-10-12 MED ORDER — OMEPRAZOLE 20 MG PO CPDR: DELAYED_RELEASE_CAPSULE | ORAL | 1 refills | Status: DC

## 2018-10-12 MED ORDER — ONETOUCH ULTRASOFT LANCETS MISC: 12 refills | Status: DC

## 2018-10-12 MED ORDER — GLUCOSE BLOOD VI STRP: ORAL_STRIP | 4 refills | Status: DC

## 2018-10-12 MED ORDER — MELOXICAM 7.5 MG PO TABS: 7.5000 mg | ORAL_TABLET | Freq: Every day | ORAL | 0 refills | Status: DC | PRN

## 2018-10-12 MED ORDER — METOPROLOL TARTRATE 25 MG PO TABS: 25.0000 mg | ORAL_TABLET | Freq: Two times a day (BID) | ORAL | 1 refills | Status: DC

## 2018-10-12 MED ORDER — METFORMIN HCL 500 MG PO TABS: ORAL_TABLET | ORAL | 1 refills | Status: DC

## 2018-10-12 MED ORDER — AMLODIPINE BESYLATE 10 MG PO TABS: ORAL_TABLET | ORAL | 3 refills | Status: DC

## 2018-10-12 MED ORDER — DICLOFENAC SODIUM 1 % TD GEL: 4.0000 g | Freq: Four times a day (QID) | TRANSDERMAL | 3 refills | Status: DC

## 2018-10-12 MED ORDER — LEVOTHYROXINE SODIUM 137 MCG PO TABS: 137.0000 ug | ORAL_TABLET | Freq: Every day | ORAL | 1 refills | Status: DC

## 2018-10-12 MED ORDER — ATORVASTATIN CALCIUM 20 MG PO TABS: ORAL_TABLET | ORAL | 0 refills | Status: DC

## 2018-10-12 NOTE — Telephone Encounter (Signed)
-----   Message from Dorna Leitz, [place] sent at 10/12/2018  1:24 PM EST -----   ----- Message ----- From: Burnard Hawthorne, FNP Sent: 10/12/2018   9:26 AM EST To: Dorna Leitz, CMA  Please call Dr Bailey Mech Anna's office ( GI) and see when her repeat colonoscopy should be ; patient wasn't sure.

## 2018-10-12 NOTE — Assessment & Plan Note (Signed)
Chronic, unchanged.  Suspect arthritic .politely declines referral to orthopedics at this time.  Discussed discretion with meloxicam reduced dose from 15 mg to 7.5 mg.  Advised her to use ice, topical therapies meloxicam for more moderate to severe pain days, may use mobic. Will follow

## 2018-10-12 NOTE — Telephone Encounter (Signed)
LM for patient to call back to advise on below.

## 2018-10-12 NOTE — Assessment & Plan Note (Signed)
Compliant metformin, pending A1c

## 2018-10-12 NOTE — Assessment & Plan Note (Signed)
At goal today, continue regimen.

## 2018-10-12 NOTE — Progress Notes (Signed)
Subjective:    Patient ID: Cynthia [name], female    DOB: 11/05/1945, 73 y.o.   MRN: [id number]  CC: Cynthia [name] is a 73 y.o. female who presents today for follow up.   HPI: Bilateral knee pain for years, unchanged. Worse when cold. Not worse with activity. using mobic 15mg  qhs. Like a refill of meloxicam. Helps with swelling. Does ice sometimes. No recent falls. No h/o GIB.  NO CKD.  Has done joint injections in the past and doesn't want to do that again.   Peripheral neuropathy- taking gabapentin.   HTN- compliant with medicaiton. NO Cp.  Dm-  On metformin.   No depression. Sleeping well.   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   Colonoscopy repeat 07/2018.  HISTORY:  Past Medical History:  Diagnosis Date  . Arthritis   . Cancer (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   Marland Kitchen COPD (chronic obstructive pulmonary disease) ([place])   . Coronary artery disease   . Diabetes mellitus without complication ([place])   . Hyperlipidemia   . Hypertension   . Lung nodule   . Thyroid disease    Past Surgical History:  Procedure Laterality Date  . BREAST BIOPSY Left 11/25/2015   stereo  neg  . BREAST EXCISIONAL BIOPSY Left yrs ago   benign  . COLONOSCOPY WITH PROPOFOL N/A 04/20/2017   Procedure: COLONOSCOPY WITH PROPOFOL;  Surgeon: [name], MD;  Location: [facility] ENDOSCOPY;  Service: Endoscopy;  Laterality: N/A;  . COLONOSCOPY WITH PROPOFOL N/A 01/08/2018   Procedure: COLONOSCOPY WITH PROPOFOL;  Surgeon: [name], MD;  Location: [facility] ENDOSCOPY;  Service: Gastroenterology;  Laterality: N/A;  . COLONOSCOPY WITH PROPOFOL N/A 08/10/2018   Procedure: COLONOSCOPY WITH PROPOFOL;  Surgeon: [name], MD;  Location: [facility] ENDOSCOPY;  Service: Gastroenterology;  Laterality: N/A;  . THYROIDECTOMY     Dr. [name]  . VAGINAL DELIVERY     4   Family History  Problem Relation Age of Onset  . Hypertension Mother   . Hypertension Father   . Diabetes Sister   . Thyroid  disease Sister   . Breast cancer Maternal Aunt   . Breast cancer Maternal Aunt     Allergies: Patient has no known allergies. Current Outpatient Medications on File Prior to Visit  Medication Sig Dispense Refill  . albuterol (PROVENTIL HFA;VENTOLIN HFA) 108 (90 Base) MCG/ACT inhaler Inhale 1-2 puffs into the lungs every 6 (six) hours as needed for wheezing or shortness of breath. 1 Inhaler 2  . aspirin 81 MG tablet Take 81 mg by mouth daily.    . Aspirin-Calcium Carbonate 81-777 MG TABS Take by mouth.    . Calcium Carbonate-Vit D-Min (CALCIUM 600+D PLUS MINERALS) 600-400 MG-UNIT TABS Take by mouth.    . Calcium Carbonate-Vitamin D 600-400 MG-UNIT per tablet Take 1 tablet by mouth daily.    Marland Kitchen gabapentin (NEURONTIN) 100 MG capsule TAKE ONE CAPSULE BY MOUTH 3 TIMES A DAY 90 capsule 6  . triamcinolone cream (KENALOG) 0.1 % Apply 1 application topically 2 (two) times daily. 30 g 0   No current facility-administered medications on file prior to visit.     Social History   Tobacco Use  . Smoking status: Former Smoker    Packs/day: 1.00    Years: 20.00    Pack years: 20.00    Types: Cigarettes    Last attempt to quit: 11/17/2012    Years since quitting: 5.9  . Smokeless tobacco: Former Systems developer  Types: Snuff, Chew  Substance Use Topics  . Alcohol use: No  . Drug use: No    Review of Systems  Constitutional: Negative for chills and fever.  Respiratory: Negative for cough.   Cardiovascular: Negative for chest pain and palpitations.  Gastrointestinal: Negative for nausea and vomiting.  Musculoskeletal: Positive for arthralgias and joint swelling. Negative for gait problem.      Objective:    BP 130/84 (BP Location: Left Arm, Patient Position: Sitting, Cuff Size: Large)   Pulse 84   Temp 98.1 F (36.7 C)   Wt 244 lb 12.8 oz (111 kg)   SpO2 97%   BMI 37.22 kg/m  BP Readings from Last 3 Encounters:  10/12/18 130/84  08/20/18 123/78  08/10/18 121/72   Wt Readings from Last 3  Encounters:  10/12/18 244 lb 12.8 oz (111 kg)  08/20/18 240 lb 3.2 oz (109 kg)  08/10/18 234 lb (106.1 kg)    Physical Exam Vitals signs reviewed.  Constitutional:      Appearance: She is well-developed.  Eyes:     Conjunctiva/sclera: Conjunctivae normal.  Cardiovascular:     Rate and Rhythm: Normal rate and regular rhythm.     Pulses: Normal pulses.     Heart sounds: Normal heart sounds.  Pulmonary:     Effort: Pulmonary effort is normal.     Breath sounds: Normal breath sounds. No wheezing, rhonchi or rales.  Musculoskeletal:     Right knee: She exhibits swelling. She exhibits normal range of motion and no effusion. No tenderness found. No medial joint line and no lateral joint line tenderness noted.     Left knee: She exhibits swelling. She exhibits normal range of motion and no effusion. No tenderness found. No medial joint line tenderness noted.     Comments: Bilateral knees symmetrically enlarged.Crepitus felt with flexion of bilateral knees.  Right knee:  Trace suspected effusion extra-articular.  No increased erythema, warmth.  Able to extend to -5 to 10 degrees and flex to 110 degrees. No catching with McMurray maneuver. No patellar apprehension. Negative anterior drawer and lachman's- no laxity appreciated.  Left knee:  Able to extend to -5 to 10 degrees and flex to 110 degrees. No catching with McMurray maneuver. No patellar apprehension. Negative anterior drawer and lachman's- no laxity appreciated.  No calf tenderness of lower leg edema bilaterally.    Skin:    General: Skin is warm and dry.  Neurological:     Mental Status: She is alert.  Psychiatric:        Speech: Speech normal.        Behavior: Behavior normal.        Thought Content: Thought content normal.        Assessment & Plan:   Problem List Items Addressed This Visit      Cardiovascular and Mediastinum   Hypertension    At goal today, continue regimen.      Relevant Orders   Basic  metabolic panel     Endocrine   Type 2 diabetes mellitus with diabetic neuropathy, unspecified (HCC)    Compliant metformin, pending A1c      Relevant Orders   Hemoglobin A1c     Other   Chronic pain of both knees - Primary    Chronic, unchanged.  Suspect arthritic .politely declines referral to orthopedics at this time.  Discussed discretion with meloxicam reduced dose from 15 mg to 7.5 mg.  Advised her to use ice, topical therapies meloxicam for more  moderate to severe pain days, may use mobic. Will follow      Relevant Medications   meloxicam (MOBIC) 7.5 MG tablet   diclofenac sodium (VOLTAREN) 1 % GEL       I have discontinued Enid Derry A. Masden's meloxicam. I am also having her start on meloxicam and diclofenac sodium. Additionally, I am having her maintain her Calcium Carbonate-Vitamin D, aspirin, albuterol, CALCIUM 600+D PLUS MINERALS, Aspirin-Calcium Carbonate, triamcinolone cream, and gabapentin.   Meds ordered this encounter  Medications  . meloxicam (MOBIC) 7.5 MG tablet    Sig: Take 1 tablet (7.5 mg total) by mouth daily as needed for pain. Take with food.    Dispense:  90 tablet    Refill:  0    Order Specific Question:   Supervising Provider    Answer:   Deborra Medina L [[id number]]  . diclofenac sodium (VOLTAREN) 1 % GEL    Sig: Apply 4 g topically 4 (four) times daily.    Dispense:  1 Tube    Refill:  3    Order Specific Question:   Supervising Provider    Answer:   Crecencio Mc [[id number]]    Return precautions given.   Risks, benefits, and alternatives of the medications and treatment plan prescribed today were discussed, and patient expressed understanding.   Education regarding symptom management and diagnosis given to patient on AVS.  Continue to follow with Burnard Hawthorne, FNP for routine health maintenance.   Cynthia [name] and I agreed with plan.   Mable Paris, FNP

## 2018-10-12 NOTE — Telephone Encounter (Signed)
Call patient Please  inform patient of that as well.  She will pleased to know that it is in 3 years.  From sarah: I called & spoke to Dr. Georgeann Oppenheim office she stated that patient should have colonoscopy repeated in 3 years as of 08/22/2018

## 2018-10-12 NOTE — Patient Instructions (Signed)
May use mobic 7.5mg  , however as discussed I would like to use this medication with discretion.  Please always take with food.  Please ice your knees twice a day for 20 minutes each time.  please continue to wear your knee wrap to reduce swelling.  Please consider orthopedic referral in the future. Pleasure seeing you always!

## 2018-10-12 NOTE — Progress Notes (Signed)
I called & spoke to Dr. Georgeann Oppenheim office she stated that patient should have colonoscopy repeated in 3 years as of 08/22/2018.

## 2018-10-24 NOTE — Telephone Encounter (Signed)
Please mail letter  Ms Dargan,  Per [name], your colonoscopy is due in 3 years.   I wanted you to be aware for your records.  Joycelyn Schmid

## 2018-10-25 ENCOUNTER — Ambulatory Visit (INDEPENDENT_AMBULATORY_CARE_PROVIDER_SITE_OTHER): Payer: Medicare Other | Admitting: Family Medicine

## 2018-10-25 ENCOUNTER — Encounter: Payer: Self-pay | Admitting: Family Medicine

## 2018-10-25 VITALS — BP 112/70 | HR 78 | Temp 98.4°F | Resp 18 | Ht 68.0 in | Wt 246.0 lb

## 2018-10-25 DIAGNOSIS — J441 Chronic obstructive pulmonary disease with (acute) exacerbation: Secondary | ICD-10-CM | POA: Diagnosis not present

## 2018-10-25 DIAGNOSIS — R05 Cough: Secondary | ICD-10-CM | POA: Diagnosis not present

## 2018-10-25 DIAGNOSIS — J439 Emphysema, unspecified: Secondary | ICD-10-CM | POA: Diagnosis not present

## 2018-10-25 DIAGNOSIS — R059 Cough, unspecified: Secondary | ICD-10-CM

## 2018-10-25 MED ORDER — GUAIFENESIN-CODEINE 100-10 MG/5ML PO SOLN: 5.0000 mL | Freq: Three times a day (TID) | ORAL | 0 refills | Status: DC | PRN

## 2018-10-25 MED ORDER — GUAIFENESIN ER 600 MG PO TB12: 600.0000 mg | ORAL_TABLET | Freq: Two times a day (BID) | ORAL | 0 refills | Status: DC | PRN

## 2018-10-25 MED ORDER — ALBUTEROL SULFATE HFA 108 (90 BASE) MCG/ACT IN AERS: 1.0000 | INHALATION_SPRAY | Freq: Four times a day (QID) | RESPIRATORY_TRACT | 2 refills | Status: DC | PRN

## 2018-10-25 NOTE — Patient Instructions (Signed)
Use your inhaler 2 puffs 2 times per day every day for the next 5 days, then after 5 days just use if needed.  He can take Mucinex tablet during the daytime to help reduce cough and loosen phlegm.  I also did send in Robitussin with codeine to use prior to bed or when you are home and resting, the cough syrup can cause drowsiness so avoid taking this prior to driving.  To reduce cough take either the Mucinex OR the Robitussin with codeine syrup, do not take them both at the same exact time

## 2018-10-25 NOTE — Telephone Encounter (Signed)
Letter printed mailed

## 2018-10-25 NOTE — Progress Notes (Signed)
Subjective:    Patient ID: Cynthia [name], female    DOB: [birth year], 73 y.o.   MRN: [id number]  HPI   Patient presents to clinic with cough and chest congestion that began about 3 days ago.  Patient states she has been using over-the-counter cough drops with good effect in reducing cough symptoms.  Denies fever or chills.  Denies chest pain.  States she does feel wheezy at times, did use a few puffs of her inhaler and this seemed to help.  Denies nausea/vomiting or diarrhea.  Patient Active Problem List   Diagnosis Date Noted  . Chronic pain of both knees 10/12/2018  . COPD exacerbation ([place]) 03/13/2018  . Mouth sore 02/05/2018  . 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 Acute pain of left foot 07/30/2017  . Screening for breast cancer 02/15/2017  . Screen for colon cancer 02/15/2017  . Routine general medical examination at a health care facility 10/21/2015  . Medicare annual wellness visit, subsequent 03/08/2013  . Abnormal CT scan, chest 11/28/2012  . Hypertension 10/18/2012  . Arthritis 10/18/2012  . Type 2 diabetes mellitus with diabetic neuropathy, unspecified ([place]) 10/18/2012  . Hypothyroidism 10/18/2012  . Hyperlipidemia 10/18/2012   Social History   Tobacco Use  . Smoking status: Former Smoker    Packs/day: 1.00    Years: 20.00    Pack years: 20.00    Types: Cigarettes    Last attempt to quit: 11/17/2012    Years since quitting: 5.9  . Smokeless tobacco: Former Systems developer    Types: Snuff, Chew  Substance Use Topics  . Alcohol use: No   Review of Systems  Constitutional: Negative for chills, fatigue and fever.  HENT: Negative for congestion, ear pain, sinus pain and sore throat.   Eyes: Negative.   Respiratory: +cough, some chest congestion and wheezes. Denies SOB   Cardiovascular: Negative for chest pain, palpitations and leg swelling.  Gastrointestinal: Negative for abdominal pain, diarrhea, nausea and vomiting.  Genitourinary: Negative for  dysuria, frequency and urgency.  Musculoskeletal: Negative for arthralgias and myalgias.  Skin: Negative for color change, pallor and rash.  Neurological: Negative for syncope, light-headedness and headaches.  Psychiatric/Behavioral: The patient is not nervous/anxious.       Objective:   Physical Exam Vitals signs and nursing note reviewed.  Constitutional:      General: She is not in acute distress.    Appearance: She is not toxic-appearing or diaphoretic.  HENT:     Head: Normocephalic.     Right Ear: Tympanic membrane, ear canal and external ear normal.     Left Ear: Tympanic membrane, ear canal and external ear normal.     Nose: Nose normal.     Mouth/Throat:     Mouth: Mucous membranes are moist.     Pharynx: No oropharyngeal exudate or posterior oropharyngeal erythema.  Eyes:     General: No scleral icterus.    Extraocular Movements: Extraocular movements intact.     Conjunctiva/sclera: Conjunctivae normal.  Neck:     Musculoskeletal: Neck supple. No neck rigidity.  Cardiovascular:     Rate and Rhythm: Normal rate and regular rhythm.  Pulmonary:     Effort: Pulmonary effort is normal. No respiratory distress.     Breath sounds: Wheezing (very faint expiratory) present. No rhonchi or rales.     Comments: Moist cough Lymphadenopathy:     Cervical: No cervical adenopathy.  Skin:    General: Skin is warm and dry.  Coloration: Skin is not jaundiced or pale.  Neurological:     Mental Status: She is alert and oriented to person, place, and time.  Psychiatric:        Mood and Affect: Mood normal.        Behavior: Behavior normal.    Vitals:   10/25/18 1012  BP: 112/70  Pulse: 78  Resp: 18  Temp: 98.4 F (36.9 C)  SpO2: 94%      Assessment & Plan:   COPD exacerbation, cough, pulmonary emphysema - patient advised to use her inhaler 2 puffs at least 2 times per day every day for the next 5 days, can also use as needed.  After 5 days advised to just use as  needed.  Also advised that she can use Mucinex tablets during the daytime to help reduce cough symptoms and thin any secretions.  I also did send in Robitussin with codeine cough syrup, patient advised to reserve this for when she can be home resting or prior to bedtime.  I offered a course of steroids to help reduce inflammation due to her COPD history, but patient declines and states usually if she starts taking a good cough medication early on, usually reduces her symptoms.  Advised patient that if she is not improving in the next 1 week she must return to clinic for reevaluation and chest x-ray at that time.  Also advised that if wheezing or shortness of breath is not improved by use of albuterol inhaler, she must call office right away for evaluation and or go to emergency room.  Offered 1 to 2-week follow-up appointment to recheck her symptoms, declines at this time.  States she will call us if she is not improving.   She will keep regularly scheduled plan follow-up PCP.

## 2018-12-31 ENCOUNTER — Encounter: Payer: Self-pay | Admitting: Family

## 2019-01-11 ENCOUNTER — Encounter: Payer: Self-pay | Admitting: Family

## 2019-01-11 ENCOUNTER — Ambulatory Visit (INDEPENDENT_AMBULATORY_CARE_PROVIDER_SITE_OTHER): Payer: Medicare Other | Admitting: Family

## 2019-01-11 ENCOUNTER — Ambulatory Visit: Payer: Self-pay | Admitting: Family

## 2019-01-11 ENCOUNTER — Other Ambulatory Visit: Payer: Self-pay

## 2019-01-11 DIAGNOSIS — Z122 Encounter for screening for malignant neoplasm of respiratory organs: Secondary | ICD-10-CM

## 2019-01-11 DIAGNOSIS — E039 Hypothyroidism, unspecified: Secondary | ICD-10-CM

## 2019-01-11 DIAGNOSIS — M25562 Pain in left knee: Secondary | ICD-10-CM

## 2019-01-11 DIAGNOSIS — M25561 Pain in right knee: Secondary | ICD-10-CM

## 2019-01-11 DIAGNOSIS — I10 Essential (primary) hypertension: Secondary | ICD-10-CM

## 2019-01-11 DIAGNOSIS — Z87891 Personal history of nicotine dependence: Secondary | ICD-10-CM | POA: Insufficient documentation

## 2019-01-11 DIAGNOSIS — E785 Hyperlipidemia, unspecified: Secondary | ICD-10-CM

## 2019-01-11 DIAGNOSIS — K219 Gastro-esophageal reflux disease without esophagitis: Secondary | ICD-10-CM

## 2019-01-11 DIAGNOSIS — E1140 Type 2 diabetes mellitus with diabetic neuropathy, unspecified: Secondary | ICD-10-CM

## 2019-01-11 DIAGNOSIS — C7A8 Other malignant neuroendocrine tumors: Secondary | ICD-10-CM

## 2019-01-11 DIAGNOSIS — J441 Chronic obstructive pulmonary disease with (acute) exacerbation: Secondary | ICD-10-CM

## 2019-01-11 DIAGNOSIS — G8929 Other chronic pain: Secondary | ICD-10-CM

## 2019-01-11 MED ORDER — ATORVASTATIN CALCIUM 20 MG PO TABS: ORAL_TABLET | ORAL | 0 refills | Status: DC

## 2019-01-11 MED ORDER — METOPROLOL TARTRATE 25 MG PO TABS: 25.0000 mg | ORAL_TABLET | Freq: Two times a day (BID) | ORAL | 1 refills | Status: DC

## 2019-01-11 MED ORDER — GLUCOSE BLOOD VI STRP: ORAL_STRIP | 4 refills | Status: DC

## 2019-01-11 MED ORDER — AMLODIPINE BESYLATE 10 MG PO TABS: ORAL_TABLET | ORAL | 3 refills | Status: DC

## 2019-01-11 MED ORDER — OMEPRAZOLE 20 MG PO CPDR: DELAYED_RELEASE_CAPSULE | ORAL | 1 refills | Status: DC

## 2019-01-11 MED ORDER — ONETOUCH ULTRASOFT LANCETS MISC: 12 refills | Status: DC

## 2019-01-11 MED ORDER — GABAPENTIN 100 MG PO CAPS: 100.0000 mg | ORAL_CAPSULE | Freq: Three times a day (TID) | ORAL | 6 refills | Status: DC

## 2019-01-11 MED ORDER — METFORMIN HCL 500 MG PO TABS: ORAL_TABLET | ORAL | 1 refills | Status: DC

## 2019-01-11 MED ORDER — LOSARTAN POTASSIUM-HCTZ 50-12.5 MG PO TABS: 1.0000 | ORAL_TABLET | Freq: Every day | ORAL | 1 refills | Status: DC

## 2019-01-11 MED ORDER — LEVOTHYROXINE SODIUM 137 MCG PO TABS: 137.0000 ug | ORAL_TABLET | Freq: Every day | ORAL | 1 refills | Status: DC

## 2019-01-11 NOTE — Assessment & Plan Note (Signed)
Compliant; good control. Will get a1c at follow up.

## 2019-01-11 NOTE — Patient Instructions (Addendum)
Call Dr Collie Siad and see when they would advise to come for urine studies and follow up with her.   Today we discussed referrals, orders. Ct chest lung cancer screen    I have placed these orders in the system for you.  Please be sure to give [place] a call if you have not heard from our office regarding this. We should hear from [place] within ONE week with information regarding your appointment. If not, please let me know immediately.   Make sure Trial of weaning of Prilosec ( omeprazole)   Take every other day for a week or two.   Then try taking every 3rd day for a week or two , then may stop if you feel well.   Long term use beyond 3 months of proton pump inhibitors , also called PPI's, is associated with malabsorption of vitamins, chronic kidney disease, fracture risk, and diarrheal illnesses. PPI's include Nexium, Prilosec, Protonix, Dexilant, and Prevacid.   I generally recommend trying to control acid reflux with lifestyle modifications including avoiding trigger foods, not eating 2 hours prior to bedtime. You may use histamine 2 blockers daily to twice daily ( this is Zantac, Pepcid) and then when symptoms flare, start back on PPI for short course.   Of note, we will need to do an endoscopy ( upper GI) to evaluate your esophagus, stomach in the future if acid reflux persists are you develop red flag symptoms: trouble swallowing, hoarseness, chronic cough, unexplained weight loss.

## 2019-01-11 NOTE — Assessment & Plan Note (Signed)
Due for follow up with Dr Tasia Catchings; given her phone number today to call.

## 2019-01-11 NOTE — Assessment & Plan Note (Signed)
Asymptomatic.  Advised of risk of long-term use of PPIs. Mailing her AVS to her.   She will trial weaning off PPI.

## 2019-01-11 NOTE — Progress Notes (Signed)
Verbal consent for services obtained from patient prior to services given.  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   HTN- compliant with medication. ( LAST OV 112/70).  Doesn't check BP at home. Denies exertional chest pain or pressure, numbness or tingling radiating to left arm or jaw, palpitations, dizziness, frequent headaches, changes in vision, or shortness of breath.   Hypothyroidism- compliant with medication.   GERD- taking prilosec daily. No trouble swallowing. No breakthrough symptoms. Former smoker ( quit 5 years ago).  No  H/o GIB.   DM2- FBG 116, 110. Compliant with metformin.   Knee pain - chronic, unchanged. mobic 7.5 prn.   Cough has resolved.   Former smoker  Dr Tasia Catchings- PET scan 02/09/18.    A/P/next steps:  Problem List Items Addressed This Visit      Cardiovascular and Mediastinum   Hypertension    BP Readings from Last 3 Encounters:  10/25/18 112/70  10/12/18 130/84  08/20/18 123/78   Usually well-controlled, no changes regimen at this time.        Respiratory   COPD exacerbation ([place])    resolved        Digestive   GERD (gastroesophageal reflux disease)    Asymptomatic.  Advised of risk of long-term use of PPIs. Mailing her AVS to her.   She will trial weaning off PPI.         Nervous and Auditory   Type 2 diabetes mellitus with diabetic neuropathy, unspecified ([place])    Compliant; good control. Will get a1c at follow up.         Other   Neuroendocrine carcinoma of colon ([place])    Due for follow up with Dr Tasia Catchings; given her phone number today to call.       Chronic pain of both knees    Unchanged. Using mobic prn. Advised to take with food. Will continue      Former smoker    We will start CT chest lung cancer screening program.          Other Visit Diagnoses    Encounter for screening for malignant neoplasm of  respiratory organs       Relevant Orders   CT CHEST LUNG CANCER SCREENING LOW DOSE WO CONTRAST       I spent 21 min  discussing plan of care over the phone.

## 2019-01-11 NOTE — Assessment & Plan Note (Signed)
We will start CT chest lung cancer screening program.

## 2019-01-11 NOTE — Assessment & Plan Note (Signed)
Unchanged. Using mobic prn. Advised to take with food. Will continue

## 2019-01-11 NOTE — Assessment & Plan Note (Signed)
BP Readings from Last 3 Encounters:  10/25/18 112/70  10/12/18 130/84  08/20/18 123/78   Usually well-controlled, no changes regimen at this time.

## 2019-01-11 NOTE — Assessment & Plan Note (Signed)
resolved 

## 2019-01-14 ENCOUNTER — Inpatient Hospital Stay: Payer: Medicare Other | Attending: Oncology

## 2019-01-14 ENCOUNTER — Telehealth: Payer: Self-pay | Admitting: *Deleted

## 2019-01-14 DIAGNOSIS — C7A8 Other malignant neuroendocrine tumors: Secondary | ICD-10-CM | POA: Diagnosis present

## 2019-01-14 NOTE — Telephone Encounter (Signed)
Received referral for low dose lung cancer screening CT scan. Message left at phone number listed in EMR for patient to call me back to facilitate scheduling scan.  

## 2019-01-15 ENCOUNTER — Other Ambulatory Visit: Payer: Self-pay

## 2019-01-15 DIAGNOSIS — C7A8 Other malignant neuroendocrine tumors: Secondary | ICD-10-CM

## 2019-01-17 ENCOUNTER — Telehealth: Payer: Self-pay | Admitting: *Deleted

## 2019-01-17 LAB — 5 HIAA, QUANTITATIVE, URINE, 24 HOUR
5-HIAA, Ur: 1.8 mg/L
5-HIAA,Quant.,24 Hr Urine: 3.8 mg/24 hr (ref 0.0–14.9)
Total Volume: 2100

## 2019-01-17 NOTE — Telephone Encounter (Signed)
Attempted to call patient regarding referral for lung cancer screening, however, there is no answer or voicemail option. Will attempt again when restrictions allow for lung screening.

## 2019-02-01 ENCOUNTER — Encounter: Payer: Self-pay | Admitting: Family

## 2019-02-01 ENCOUNTER — Ambulatory Visit (INDEPENDENT_AMBULATORY_CARE_PROVIDER_SITE_OTHER): Payer: Medicare Other | Admitting: Family

## 2019-02-01 DIAGNOSIS — G8929 Other chronic pain: Secondary | ICD-10-CM

## 2019-02-01 DIAGNOSIS — M25561 Pain in right knee: Secondary | ICD-10-CM | POA: Diagnosis not present

## 2019-02-01 DIAGNOSIS — M25562 Pain in left knee: Secondary | ICD-10-CM | POA: Diagnosis not present

## 2019-02-01 NOTE — Progress Notes (Signed)
Verbal consent for services obtained from patient prior to services given.  Location of call:  provider at work patient at home  Names of all persons present for services: Cynthia Paris, NP Chief complaint:   Bilateral knees are swollen, particular in left x 2 days, unchanged.  Left knee describes swelling on medial side, right knee is swollen chronically 'stay swollen most all the time.' Left knee is bothering her more.  Walks 4 laps in yard everyday.  No falls, injury, twisting. No pain or swelling in calf, ankles, feet.  No dvt. No erythema, laceration, increased warmth, fever, numbness, sob.  Painful and stiff. Pain feels worse at night time. Painful to bend. Stairs are painful. No giving way of knees.   Has been using ice without relief. Mobic daily right now with some relief.    History, background, results pertinent:  Chronic knee pain- had steroid injections in the past.  Former smoker Retired- had knee pain which part of reason retired.   A/P/next steps: Problem List Items Addressed This Visit      Other   Chronic pain of both knees - Primary    Failed conservative therapy. Suspect OA. She declines being seen today at [facility] urgent care and feels comfortable waiting on consult with orthopedics. She will let me know how she is doing. Education provided on AVS.        Relevant Orders   Ambulatory referral to Orthopedic Surgery       I spent 25 min  discussing plan of care over the phone.

## 2019-02-01 NOTE — Patient Instructions (Addendum)
Suspect arthritis is playing a role in your pain; if therapy at home is not working, I would like to consult orthopedics.   Continue voltaren gel and mobic  for knee pain. Continue icing.   Today we discussed referrals, orders. Orthopedics.    I have placed these orders in the system for you.  Please be sure to give [place] a call if you have not heard from our office regarding this. We should hear from [place] within ONE week with information regarding your appointment. If not, please let me know immediately.   Let me know of any new or worsening symptoms.   Stay safe!   Acute Knee Pain, Adult Many things can cause knee pain. Sometimes, knee pain is sudden (acute) and may be caused by damage, swelling, or irritation of the muscles and tissues that support your knee. The pain often goes away on its own with time and rest. If the pain does not go away, tests may be done to find out what is causing the pain. Follow these instructions at home: Pay attention to any changes in your symptoms. Take these actions to relieve your pain. If you have a knee sleeve or brace:   Wear the sleeve or brace as told by your doctor. Remove it only as told by your doctor.  Loosen the sleeve or brace if your toes: ? Tingle. ? Become numb. ? Turn cold and blue.  Keep the sleeve or brace clean.  If the sleeve or brace is not waterproof: ? Do not let it get wet. ? Cover it with a watertight covering when you take a bath or shower. Activity  Rest your knee.  Do not do things that cause pain.  Avoid activities where both feet leave the ground at the same time (high-impact activities). Examples are running, jumping rope, and doing jumping jacks.  Work with a physical therapist to make a safe exercise program, as told by your doctor. Managing pain, stiffness, and swelling   If told, put ice on the knee: ? Put ice in a plastic bag. ? Place a towel between your skin and the bag. ? Leave the ice on for 20  minutes, 2-3 times a day.  If told, put pressure (compression) on your injured knee to control swelling, give support, and help with discomfort. Compression may be done with an elastic bandage. General instructions  Take all medicines only as told by your doctor.  Raise (elevate) your knee while you are sitting or lying down. Make sure your knee is higher than your heart.  Sleep with a pillow under your knee.  Do not use any products that contain nicotine or tobacco. These include cigarettes, e-cigarettes, and chewing tobacco. These products may slow down healing. If you need help quitting, ask your doctor.  If you are overweight, work with your doctor and a food expert (dietitian) to set goals to lose weight. Being overweight can make your knee hurt more.  Keep all follow-up visits as told by your doctor. This is important. Contact a doctor if:  The knee pain does not stop.  The knee pain changes or gets worse.  You have a fever along with knee pain.  Your knee feels warm when you touch it.  Your knee gives out or locks up. Get help right away if:  Your knee swells, and the swelling gets worse.  You cannot move your knee.  You have very bad knee pain. Summary  Many things can cause knee pain.  The pain often goes away on its own with time and rest.  Your doctor may do tests to find out the cause of the pain.  Pay attention to any changes in your symptoms. Relieve your pain with rest, medicines, light activity, and use of ice.  Get help right away if you cannot move your knee or your knee pain is very bad. This information is not intended to replace advice given to you by your health care provider. Make sure you discuss any questions you have with your health care provider. Document Released: 12/02/2008 Document Revised: 02/15/2018 Document Reviewed: 02/15/2018 Elsevier Interactive Patient Education  2019 [employer].

## 2019-02-01 NOTE — Assessment & Plan Note (Addendum)
Failed conservative therapy. Suspect OA. She declines being seen today at [facility] urgent care and feels comfortable waiting on consult with orthopedics. She will let me know how she is doing. Education provided on AVS.

## 2019-02-07 ENCOUNTER — Telehealth: Payer: Self-pay | Admitting: *Deleted

## 2019-02-07 ENCOUNTER — Encounter: Payer: Self-pay | Admitting: *Deleted

## 2019-02-07 DIAGNOSIS — Z122 Encounter for screening for malignant neoplasm of respiratory organs: Secondary | ICD-10-CM

## 2019-02-07 DIAGNOSIS — Z87891 Personal history of nicotine dependence: Secondary | ICD-10-CM

## 2019-02-07 NOTE — Telephone Encounter (Signed)
Received referral for initial lung cancer screening scan. Contacted patient and obtained smoking history,(former, quit 2015, 36 pack year) as well as answering questions related to screening process. Patient denies signs of lung cancer such as weight loss or hemoptysis. Patient denies comorbidity that would prevent curative treatment if lung cancer were found. Patient is scheduled for shared decision making visit and CT scan on 02/13/19 at 945am.

## 2019-02-13 ENCOUNTER — Inpatient Hospital Stay: Payer: Medicare Other | Attending: Oncology | Admitting: Oncology

## 2019-02-13 ENCOUNTER — Other Ambulatory Visit: Payer: Self-pay

## 2019-02-13 ENCOUNTER — Ambulatory Visit
Admission: RE | Admit: 2019-02-13 | Discharge: 2019-02-13 | Disposition: A | Discharge status: Home / Self Care | Payer: Medicare Other | Source: Ambulatory Visit | Attending: Oncology | Admitting: Oncology

## 2019-02-13 DIAGNOSIS — Z122 Encounter for screening for malignant neoplasm of respiratory organs: Secondary | ICD-10-CM | POA: Insufficient documentation

## 2019-02-13 DIAGNOSIS — Z87891 Personal history of nicotine dependence: Secondary | ICD-10-CM

## 2019-02-13 IMAGING — CT CT CHEST LUNG CANCER SCREENING LOW DOSE
2 of 5 series · 15 of 40 positions shown, 18 images · non-contrast
Comparison: Standard CT chest [DATE], [DATE], [DATE].

CLINICAL DATA: 73-year-old female with 36 pack-year history of
smoking. Lung cancer screening.

EXAM:
CT CHEST WITHOUT CONTRAST LOW-DOSE FOR LUNG CANCER SCREENING
TECHNIQUE: Multidetector CT imaging of the chest was performed following the
standard protocol without IV contrast.

[Series 3: lung · axial · 0.69mm/px · z∈[-928,-654]mm · 12 of 304 slices shown, 15 images (1 of 2)]
[im 15/304  mediastinal]
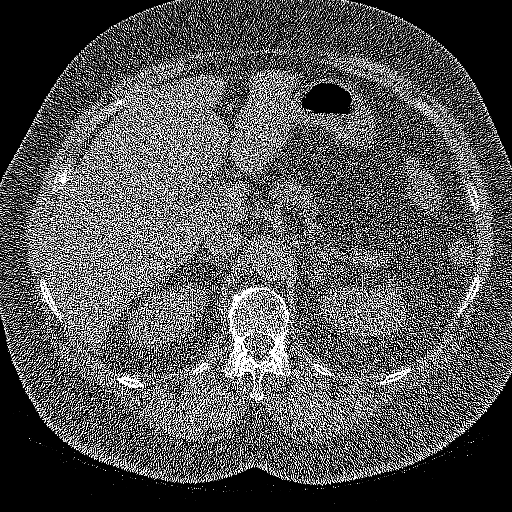
[im 15/304  lung]
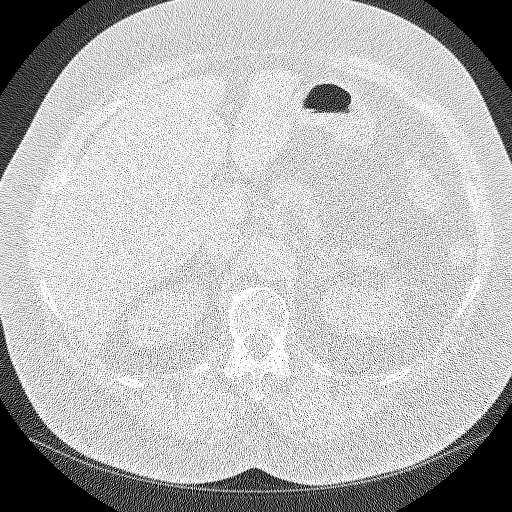
[im 44/304  lung]
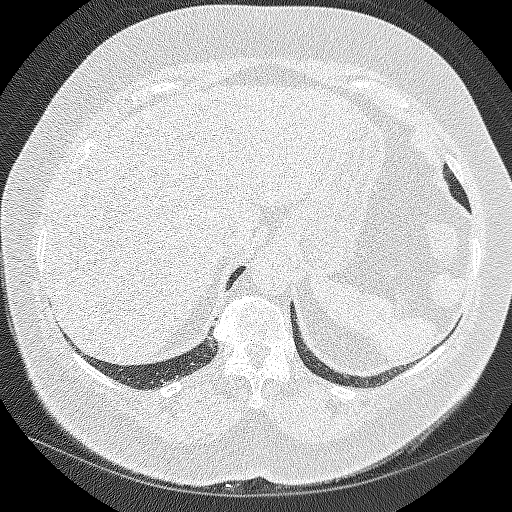
[im 73/304  lung]
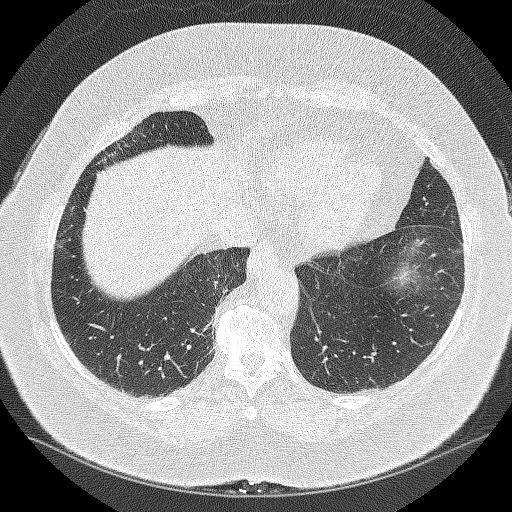
[im 87/304  lung]
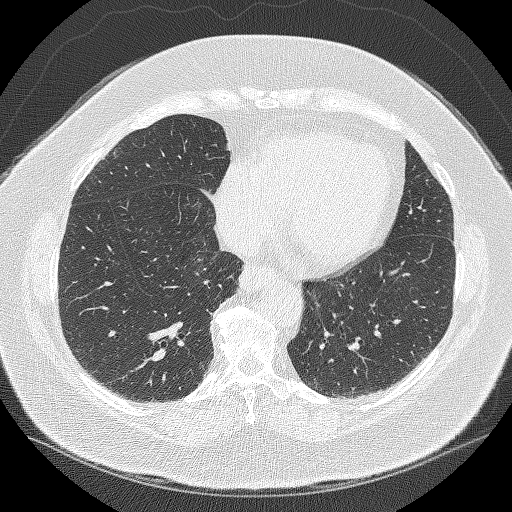
[im 116/304  mediastinal]
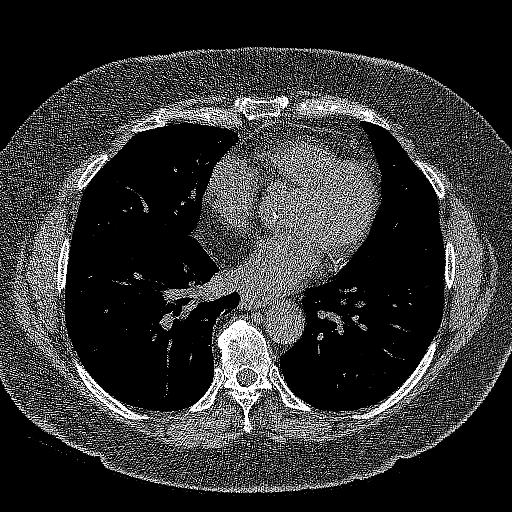
[im 116/304  lung]
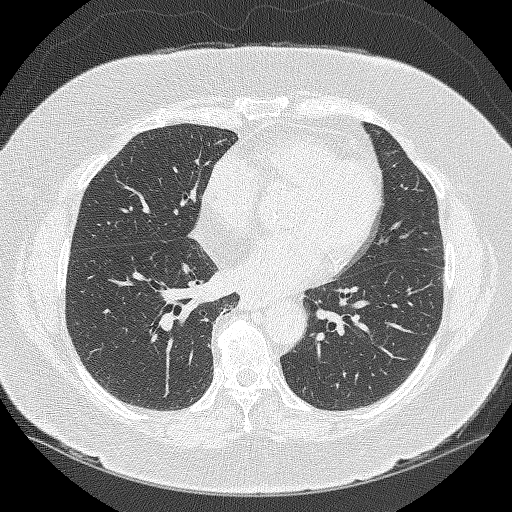
[im 145/304  lung]
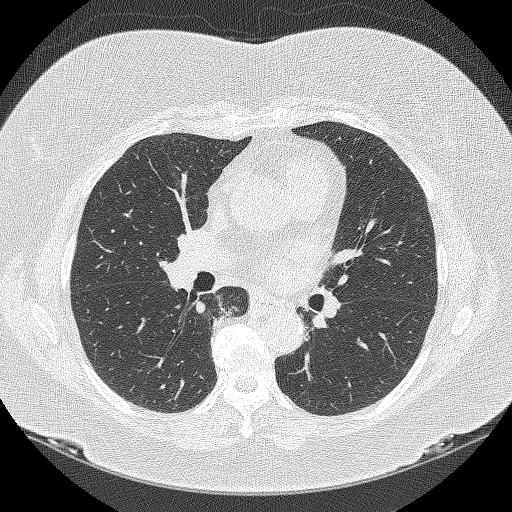
[im 159/304  lung]
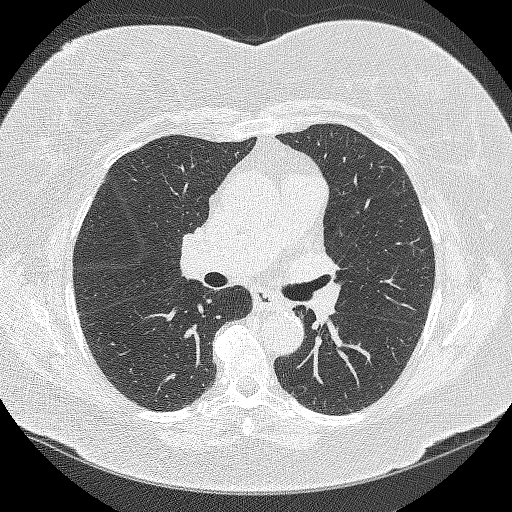
[im 188/304  lung]
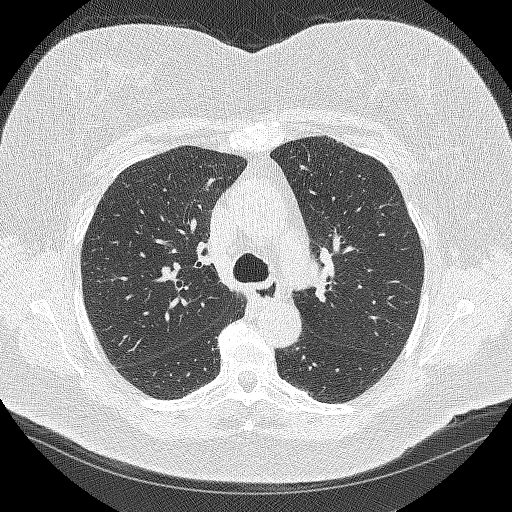
[im 217/304  mediastinal]
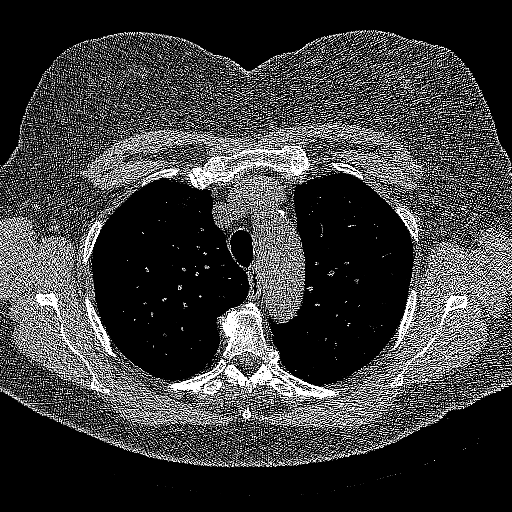
[im 217/304  lung]
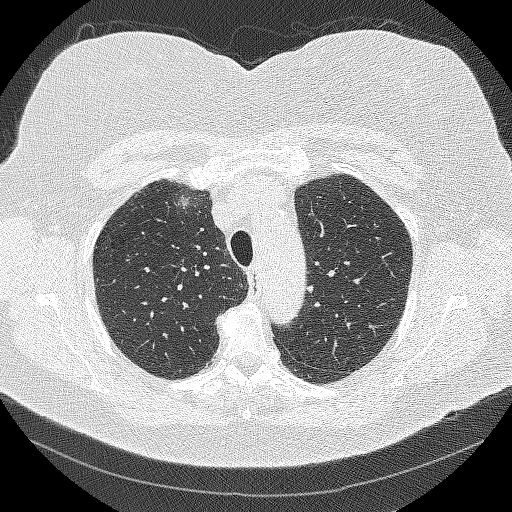
[im 231/304  lung]
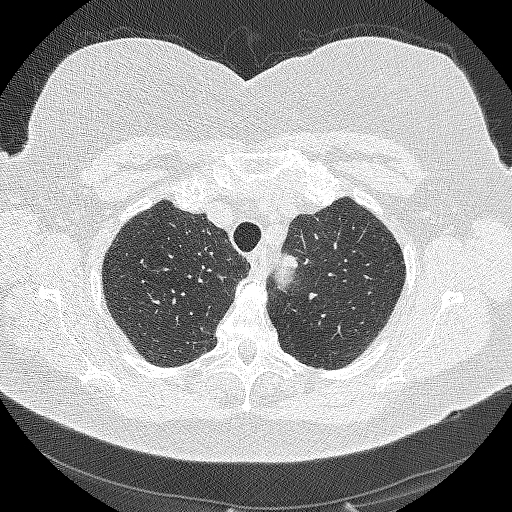
[im 260/304  lung]
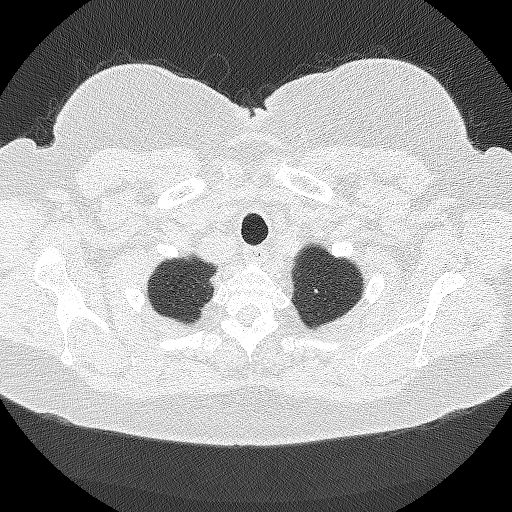
[im 289/304  lung]
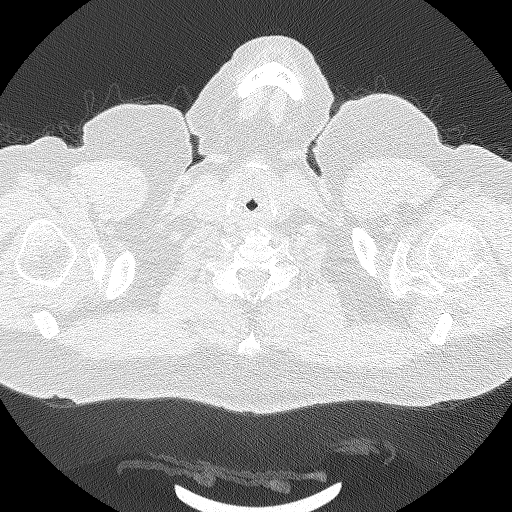

[Series 4: lung · coronal · 0.60mm/px · 3 of 355 slices shown (2 of 2)]
[im 71/355  lung]
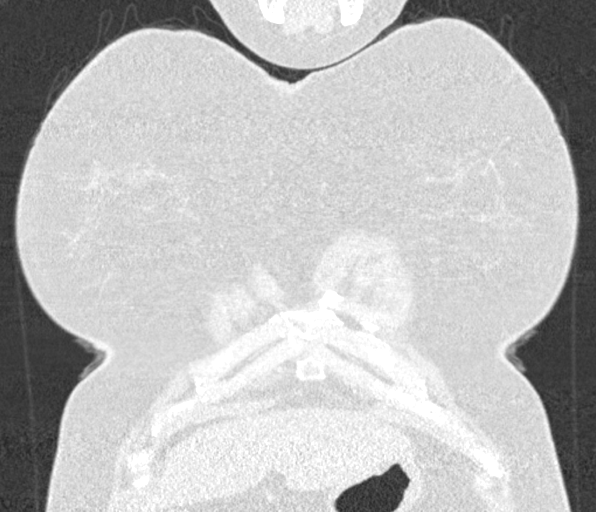
[im 142/355  lung]
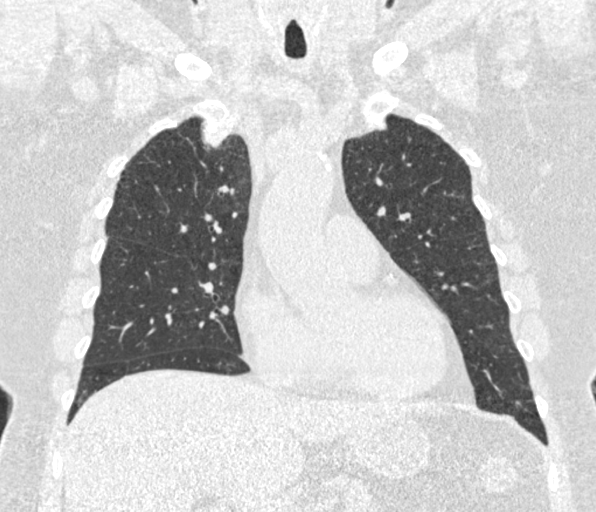
[im 213/355  lung]
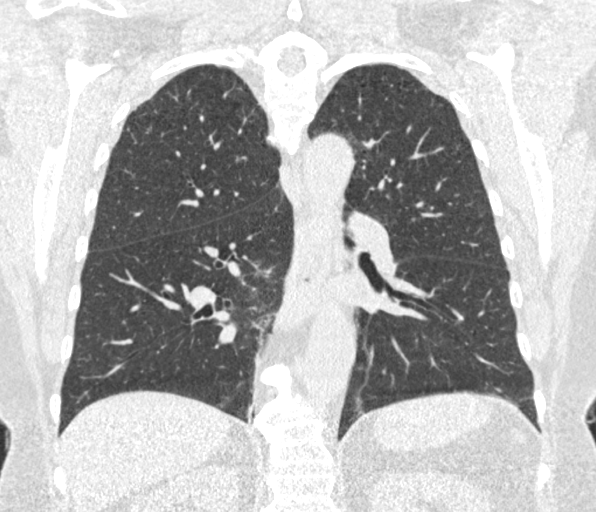

[15 of 40 positions shown; findings below may reference images not displayed]

FINDINGS: Cardiovascular: The heart size is normal. No substantial pericardial
effusion. Coronary artery calcification is evident. Atherosclerotic
calcification is noted in the wall of the thoracic aorta.

Mediastinum/Nodes: No mediastinal lymphadenopathy. No evidence for
gross hilar lymphadenopathy although assessment is limited by the
lack of intravenous contrast on today's study. The esophagus has
normal imaging features. There is no axillary lymphadenopathy.

Lungs/Pleura: The central tracheobronchial airways are patent.
Centrilobular emphsyema noted. Dominant nodule on today's study
identified medial left upper lobe, along the transverse aorta. This
has been visualized on multiple prior standard chest CTs dating back
to [DATE] showing no interval change over that more than 3 year
interval, consistent with benign etiology. Additional tiny left
upper lobe pulmonary nodules are well below 4 mm maximum size.
Medial right upper lobe nodule measures 4.9 mm maximum volume
derived equivalent diameter (image 96). No overtly suspicious nodule
or mass on today's exam. No pleural effusion.

Upper Abdomen: Unremarkable.

Musculoskeletal: No worrisome lytic or sclerotic osseous
abnormality.
IMPRESSION: 1. Lung-RADS 2, benign appearance or behavior. Continue annual
screening with low-dose chest CT without contrast in 12 months.
2.  Emphysema. ([2I]-[2I])
3.  Aortic Atherosclerois ([2I]-170.0)

## 2019-02-13 NOTE — Progress Notes (Signed)
Virtual Visit via Video Note  I connected with Cynthia [name] on 02/13/19 at  9:45 AM EDT by a video enabled telemedicine application and verified that I am speaking with the correct person using two identifiers.  Location: Patient: [facility] Provider: Home   In accordance with CMS guidelines, patient has met eligibility criteria including age, absence of signs or symptoms of lung cancer.  Social History   Tobacco Use  . Smoking status: Former Smoker    Packs/day: 0.75    Years: 48.00    Pack years: 36.00    Types: Cigarettes    Last attempt to quit: 2015    Years since quitting: 5.4  . Smokeless tobacco: Former Systems developer    Types: Snuff, Chew  Substance Use Topics  . Alcohol use: No  . Drug use: No     A shared decision-making session was conducted prior to the performance of CT scan. This includes one or more decision aids, includes benefits and harms of screening, follow-up diagnostic testing, over-diagnosis, false positive rate, and total radiation exposure.  Counseling on the importance of adherence to annual lung cancer LDCT screening, impact of co-morbidities, and ability or willingness to undergo diagnosis and treatment is imperative for compliance of the program.  Counseling on the importance of continued smoking cessation for former smokers; the importance of smoking cessation for current smokers, and information about tobacco cessation interventions have been given to patient including [place] and 1800 quit [place] programs.  Written order for lung cancer screening with LDCT has been given to the patient and any and all questions have been answered to the [name] of my abilities.   Yearly follow up will be coordinated by Burgess Estelle, Thoracic Navigator.  Faythe Casa, NP 02/13/2019 9:36 AM

## 2019-02-15 ENCOUNTER — Encounter: Payer: Self-pay | Admitting: *Deleted

## 2019-02-15 ENCOUNTER — Telehealth: Payer: Self-pay | Admitting: Family

## 2019-02-15 DIAGNOSIS — I700 Atherosclerosis of aorta: Secondary | ICD-10-CM | POA: Insufficient documentation

## 2019-02-15 NOTE — Telephone Encounter (Signed)
Mail letter  Cynthia [name], [name] you are doing well.   I received results from your annual CT lung scan from [facility] which showed benign findings of lungs. You will need do this again next year so please ensure you are contacted in regards to this and certainly call our office if you are not contacted for another test.   It was noted again on the exam that you have coronary artery atherosclerosis, or often referred to as hardening of the arteries.   This can put you at risk for stroke, heart attack.   Your cholesterol looked great in October of last year. Continue the lipitor.   Please continue to follow with cardiology, Dr Clayborn Bigness as well.   Stay safe!   Best,   Mable Paris, NP

## 2019-02-15 NOTE — Telephone Encounter (Signed)
Printed and mailed

## 2019-02-18 ENCOUNTER — Inpatient Hospital Stay: Payer: Medicare Other

## 2019-02-18 ENCOUNTER — Inpatient Hospital Stay: Payer: Medicare Other | Admitting: Oncology

## 2019-03-08 ENCOUNTER — Other Ambulatory Visit: Payer: Self-pay | Admitting: Family

## 2019-03-08 DIAGNOSIS — Z1231 Encounter for screening mammogram for malignant neoplasm of breast: Secondary | ICD-10-CM

## 2019-03-11 ENCOUNTER — Other Ambulatory Visit: Payer: Self-pay

## 2019-03-12 ENCOUNTER — Inpatient Hospital Stay: Payer: Medicare Other | Admitting: Oncology

## 2019-03-12 ENCOUNTER — Encounter: Payer: Self-pay | Admitting: Oncology

## 2019-03-12 ENCOUNTER — Other Ambulatory Visit: Payer: Self-pay

## 2019-03-12 ENCOUNTER — Inpatient Hospital Stay: Payer: Medicare Other | Attending: Oncology

## 2019-03-12 VITALS — BP 104/69 | HR 79 | Temp 96.8°F | Resp 18 | Wt 241.0 lb

## 2019-03-12 DIAGNOSIS — I2510 Atherosclerotic heart disease of native coronary artery without angina pectoris: Secondary | ICD-10-CM | POA: Insufficient documentation

## 2019-03-12 DIAGNOSIS — J449 Chronic obstructive pulmonary disease, unspecified: Secondary | ICD-10-CM | POA: Insufficient documentation

## 2019-03-12 DIAGNOSIS — E119 Type 2 diabetes mellitus without complications: Secondary | ICD-10-CM | POA: Insufficient documentation

## 2019-03-12 DIAGNOSIS — M25569 Pain in unspecified knee: Secondary | ICD-10-CM | POA: Insufficient documentation

## 2019-03-12 DIAGNOSIS — M25551 Pain in right hip: Secondary | ICD-10-CM | POA: Diagnosis not present

## 2019-03-12 DIAGNOSIS — G8929 Other chronic pain: Secondary | ICD-10-CM | POA: Insufficient documentation

## 2019-03-12 DIAGNOSIS — I10 Essential (primary) hypertension: Secondary | ICD-10-CM | POA: Insufficient documentation

## 2019-03-12 DIAGNOSIS — E785 Hyperlipidemia, unspecified: Secondary | ICD-10-CM | POA: Diagnosis not present

## 2019-03-12 DIAGNOSIS — C7A8 Other malignant neuroendocrine tumors: Secondary | ICD-10-CM

## 2019-03-12 DIAGNOSIS — M1990 Unspecified osteoarthritis, unspecified site: Secondary | ICD-10-CM | POA: Insufficient documentation

## 2019-03-12 DIAGNOSIS — Z87891 Personal history of nicotine dependence: Secondary | ICD-10-CM | POA: Diagnosis not present

## 2019-03-12 LAB — CBC WITH DIFFERENTIAL/PLATELET
Abs Immature Granulocytes: 0.01 10*3/uL (ref 0.00–0.07)
Basophils Absolute: 0.1 10*3/uL (ref 0.0–0.1)
Basophils Relative: 1 %
Eosinophils Absolute: 0.2 10*3/uL (ref 0.0–0.5)
Eosinophils Relative: 4 %
HCT: 40.6 % (ref 36.0–46.0)
Hemoglobin: 13.9 g/dL (ref 12.0–15.0)
Immature Granulocytes: 0 %
Lymphocytes Relative: 36 %
Lymphs Abs: 1.8 10*3/uL (ref 0.7–4.0)
MCH: 31.5 pg (ref 26.0–34.0)
MCHC: 34.2 g/dL (ref 30.0–36.0)
MCV: 92.1 fL (ref 80.0–100.0)
Monocytes Absolute: 0.9 10*3/uL (ref 0.1–1.0)
Monocytes Relative: 18 %
Neutro Abs: 2 10*3/uL (ref 1.7–7.7)
Neutrophils Relative %: 41 %
Platelets: 272 10*3/uL (ref 150–400)
RBC: 4.41 MIL/uL (ref 3.87–5.11)
RDW: 13.1 % (ref 11.5–15.5)
WBC: 4.8 10*3/uL (ref 4.0–10.5)
nRBC: 0 % (ref 0.0–0.2)

## 2019-03-12 LAB — COMPREHENSIVE METABOLIC PANEL
ALT: 24 U/L (ref 0–44)
AST: 27 U/L (ref 15–41)
Albumin: 4.4 g/dL (ref 3.5–5.0)
Alkaline Phosphatase: 56 U/L (ref 38–126)
Anion gap: 11 (ref 5–15)
BUN: 16 mg/dL (ref 8–23)
CO2: 25 mmol/L (ref 22–32)
Calcium: 9.5 mg/dL (ref 8.9–10.3)
Chloride: 103 mmol/L (ref 98–111)
Creatinine, Ser: 0.76 mg/dL (ref 0.44–1.00)
GFR calc Af Amer: >60 mL/min (ref >60)
GFR calc non Af Amer: >60 mL/min (ref >60)
Glucose, Bld: 115 mg/dL — ABNORMAL HIGH (ref 70–99)
Potassium: 3.8 mmol/L (ref 3.5–5.1)
Sodium: 139 mmol/L (ref 135–145)
Total Bilirubin: 0.7 mg/dL (ref 0.3–1.2)
Total Protein: 7.4 g/dL (ref 6.5–8.1)

## 2019-03-12 NOTE — Progress Notes (Signed)
Patient here for follow up. Complain of pain to right hip.

## 2019-03-14 LAB — CHROMOGRANIN A: Chromogranin A (ng/mL): 41.6 ng/mL (ref 0.0–101.8)

## 2019-03-15 NOTE — Progress Notes (Signed)
Hematology/Oncology follow up note [facility] Telephone:([phone]) [phone] Fax:(336) [phone]   Patient Care Team: Burnard Hawthorne, FNP as PCP - General (Family Medicine) Jackolyn Confer, MD (Internal Medicine) Clent Jacks, RN as Registered Nurse  REFERRING PROVIDER: Gastroenterology: [place] [facility]. REASON FOR VISIT Follow up for management of neuroendocrine cancer of the colon and discussion about dotatate PET scan results.  HISTORY OF PRESENTING ILLNESS:  Cynthia [name] is a  73 y.o.  female with PMH listed below who was referred to me for evaluation of neuroendocrine cancer  She recently have screening colonoscopy done and was found to have 3 ascending colon polyps and 6 sigmoid colon polyps, all removed and retrieved. All three ascending colon polyps were tubular adenoma. 5 sigmoid colon polyps were hyperplastic polyps. One polyp turned out to be well defferentiated neuroendocrine tumor, 2.37mm, grade 1.   # 1 Year colonoscopy: S/p repeat colonoscopy on 08/10/2018. Polyps resected. Pathology negative malignancy.   INTERVAL HISTORY Cynthia [name] is a 73 y.o. female who has above history reviewed by me today presents for follow up of neuroendocrine cancer of colon.  Denies any diarrhea, facial flush wheezing, abdominal pain, blood in stool.  Appetite is good. No weight loss.  Chronic right hip pain. She follows up with PCP and recently had a virtual visit with primary care provider.  Note reviewed.  Chronic knee pain had steroid injection in the past referred to orthopedic surgery.  Review of Systems  Constitutional: Negative for chills, fever, malaise/fatigue and weight loss.  HENT: Negative for sore throat.   Eyes: Negative for redness.  Respiratory: Negative for cough, shortness of breath and wheezing.   Cardiovascular: Negative for chest pain, palpitations and leg swelling.  Gastrointestinal: Negative for abdominal pain, blood in stool, nausea and  vomiting.  Genitourinary: Negative for dysuria.  Musculoskeletal: Positive for joint pain. Negative for myalgias.  Skin: Negative for rash.  Neurological: Negative for dizziness, tingling and tremors.  Endo/Heme/Allergies: Does not bruise/bleed easily.  Psychiatric/Behavioral: Negative for hallucinations.    MEDICAL HISTORY:  Past Medical History:  Diagnosis Date  . Arthritis   . 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   Marland Kitchen COPD (chronic obstructive pulmonary disease) ([place])   . Coronary artery disease   . Diabetes mellitus without complication ([place])   . Hyperlipidemia   . Hypertension   . Lung nodule   . Thyroid disease     SURGICAL HISTORY: Past Surgical History:  Procedure Laterality Date  . BREAST BIOPSY Left 11/25/2015   stereo  neg  . BREAST EXCISIONAL BIOPSY Left yrs ago   benign  . COLONOSCOPY WITH PROPOFOL N/A 04/20/2017   Procedure: COLONOSCOPY WITH PROPOFOL;  Surgeon: [name], MD;  Location: [facility] ENDOSCOPY;  Service: Endoscopy;  Laterality: N/A;  . COLONOSCOPY WITH PROPOFOL N/A 01/08/2018   Procedure: COLONOSCOPY WITH PROPOFOL;  Surgeon: [name], MD;  Location[facility]COPY;  Service: Gastroenterology;  Laterality: N/A;  . COLONOSCOPY WITH PROPOFOL N/A 08/10/2018   Procedure: COLONOSCOPY WITH PROPOFOL;  Surgeon: [name], MD;  Location: [facility] ENDOSCOPY;  Service: Gastroenterology;  Laterality: N/A;  . THYROIDECTOMY     Dr. [name]  . VAGINAL DELIVERY     4    SOCIAL HISTORY: Social History   Socioeconomic History  . Marital status: Married    Spouse name: Not on file  . Number of children: 4  . Years of education: Not on file  . Highest education level: Not on file  Occupational  History  . Not on file  Social Needs  . Financial resource strain: Not hard at all  . Food insecurity    Worry: Never true    Inability: Never true  . Transportation needs    Medical: No    Non-medical: No  Tobacco Use  . Smoking status: Former Smoker     Packs/day: 0.75    Years: 48.00    Pack years: 36.00    Types: Cigarettes    Quit date: 2015    Years since quitting: 5.4  . Smokeless tobacco: Former Systems developer    Types: Snuff, Chew  Substance and Sexual Activity  . Alcohol use: No  . Drug use: No  . Sexual activity: Never  Lifestyle  . Physical activity    Days per week: Not on file    Minutes per session: Not on file  . Stress: Not on file  Relationships  . Social Herbalist on phone: Not on file    Gets together: Not on file    Attends religious service: Not on file    Active member of club or organization: Not on file    Attends meetings of clubs or organizations: Not on file    Relationship status: Not on file  . Intimate partner violence    Fear of current or ex partner: No    Emotionally abused: No    Physically abused: No    Forced sexual activity: No  Other Topics Concern  . Not on file  Social History Narrative   Lives in [place] with husband. Has 4 children.      4 grandchildren.      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 Hypertension Mother   . Hypertension Father   . Diabetes Sister   . Thyroid disease Sister   . Breast cancer Maternal Aunt   . Breast cancer Maternal Aunt     ALLERGIES:  has No Known Allergies.  MEDICATIONS:  Current Outpatient Medications  Medication Sig Dispense Refill  . acetaminophen (TYLENOL) 500 MG tablet Take 500 mg by mouth every 8 (eight) hours as needed.    Marland Kitchen amLODipine (NORVASC) 10 MG tablet TAKE 1 TABLET (10 MG TOTAL) BY MOUTH DAILY. 90 tablet 3  . aspirin 81 MG tablet Take 81 mg by mouth daily.    Marland Kitchen atorvastatin (LIPITOR) 20 MG tablet TAKE 1 TABLET (20 MG TOTAL) BY MOUTH DAILY. 90 tablet 0  . Calcium Carbonate-Vit D-Min (CALCIUM 600+D PLUS MINERALS) 600-400 MG-UNIT TABS Take by mouth.    . diclofenac sodium (VOLTAREN) 1 %  GEL Apply 2 g topically 4 (four) times daily.    Marland Kitchen gabapentin (NEURONTIN) 100 MG capsule Take 1 capsule (100 mg total) by mouth 3 (three) times daily. 90 capsule 6  . glucose blood (ONE TOUCH ULTRA TEST) test strip TEST BLOOD SUGAR 1-2 TIMES A DAY 100 each 4  . Lancets (ONETOUCH ULTRASOFT) lancets Use as instructed 100 each 12  . levothyroxine (SYNTHROID) 137 MCG tablet Take 1 tablet (137 mcg total) by mouth daily before breakfast. 90 tablet 1  . losartan-hydrochlorothiazide (HYZAAR) 50-12.5 MG tablet Take 1 tablet by mouth daily. 90 tablet 1  . meloxicam (MOBIC) 7.5 MG tablet Take 1 tablet (7.5 mg total) by mouth daily as needed  for pain. Take with food. 90 tablet 0  . metFORMIN (GLUCOPHAGE) 500 MG tablet Take one tablet by mouth two times a day. 180 tablet 1  . metoprolol tartrate (LOPRESSOR) 25 MG tablet Take 1 tablet (25 mg total) by mouth 2 (two) times daily. 90 tablet 1  . omeprazole (PRILOSEC) 20 MG capsule TAKE 1 CAPSULE BY MOUTH EVERY DAY 90 capsule 1  . Calcium Carbonate-Vitamin D 600-400 MG-UNIT per tablet Take 1 tablet by mouth daily.     No current facility-administered medications for this visit.      PHYSICAL EXAMINATION: ECOG PERFORMANCE STATUS: 0 - Asymptomatic Vitals:   03/12/19 1418  BP: 104/69  Pulse: 79  Resp: 18  Temp: (!) 96.8 F (36 C)   Filed Weights   03/12/19 1418  Weight: 241 lb (109.3 kg)    Physical Exam Constitutional:      General: She is not in acute distress.    Appearance: She is well-developed.  HENT:     Head: Normocephalic and atraumatic.  Eyes:     General: No scleral icterus.    Conjunctiva/sclera: Conjunctivae normal.     Pupils: Pupils are equal, round, and reactive to light.  Neck:     Musculoskeletal: Normal range of motion and neck supple.  Cardiovascular:     Rate and Rhythm: Normal rate and regular rhythm.     Heart sounds: Normal heart sounds.  Pulmonary:     Effort: Pulmonary effort is normal. No respiratory distress.      Breath sounds: Normal breath sounds. No wheezing.  Abdominal:     General: Bowel sounds are normal. There is no distension.     Palpations: Abdomen is soft. There is no mass.     Tenderness: There is no abdominal tenderness.  Musculoskeletal: Normal range of motion.        General: No deformity.  Lymphadenopathy:     Cervical: No cervical adenopathy.  Skin:    General: Skin is warm and dry.     Findings: No erythema or rash.  Neurological:     Mental Status: She is alert and oriented to person, place, and time.     Cranial Nerves: No cranial nerve deficit.     Coordination: Coordination normal.  Psychiatric:        Behavior: Behavior normal.        Thought Content: Thought content normal.      LABORATORY DATA:  I have reviewed the data as listed Lab Results  Component Value Date   WBC 4.8 03/12/2019   HGB 13.9 03/12/2019   HCT 40.6 03/12/2019   MCV 92.1 03/12/2019   PLT 272 03/12/2019   Recent Labs    07/17/18 0841 08/20/18 1026 10/12/18 0942 03/12/19 1322  NA 138 139 140 139  K 3.7 4.1 4.6 3.8  CL 102 101 102 103  CO2 28 29 29 25   GLUCOSE 111* 112* 111* 115*  BUN 11 17 13 16   CREATININE 0.69 0.70 0.72 0.76  CALCIUM 9.5 9.9 10.1 9.5  GFRNONAA  --  >60  --  >60  GFRAA  --  >60  --  >60  PROT 6.9 7.6  --  7.4  ALBUMIN 4.1 4.2  --  4.4  AST 23 30  --  27  ALT 22 28  --  24  ALKPHOS 57 61  --  56  BILITOT 0.6 0.6  --  0.7    Serum serotonin level at 111 within normal range 5 HIAA  urine test was obtained and patient has not gotten done yet.   ASSESSMENT & PLAN:  1. Neuroendocrine carcinoma of colon ([place])    #Repeat biochemical markers has been stable. chromogranin A has been followed  03/12/2019 41.6 ng/ ml 08/20/2018 1 nmol/L  5 HIAA quantitative- 24h urine 01/14/2019  3.8 02/15/2018   1.6  CT images as clinically indicated.   Follow-up in 6 months, 5 HIAA, chromogranin A, CBC and CMP. All questions were answered. The patient knows to call the  clinic with any problems questions or concerns.  Return of visit: 6 months.    Earlie Server, MD, PhD Hematology Oncology [facility] Pager- [phone] 03/15/2019

## 2019-04-08 ENCOUNTER — Other Ambulatory Visit: Payer: Self-pay

## 2019-04-08 ENCOUNTER — Ambulatory Visit (INDEPENDENT_AMBULATORY_CARE_PROVIDER_SITE_OTHER): Payer: Medicare Other | Admitting: Family

## 2019-04-08 ENCOUNTER — Encounter: Payer: Self-pay | Admitting: Family

## 2019-04-08 DIAGNOSIS — K056 Periodontal disease, unspecified: Secondary | ICD-10-CM | POA: Insufficient documentation

## 2019-04-08 MED ORDER — MAGIC MOUTHWASH W/LIDOCAINE: ORAL | 0 refills | Status: DC

## 2019-04-08 NOTE — Progress Notes (Signed)
Verbal consent for services obtained from patient prior to services given.  Location of call:  provider at work patient at home  Names of all persons present for services: Cynthia Paris, NP Chief complaint:   CC: Gums are sore and irritated x yesterday, sudden onset. Some improvement today.   gums feel raw on left side top and the bottom.   Not wearing dentures today, and pain feels better. Eating soft food because gums are sore.   Not sure what may triggered this. No new medications.   Had BBQ chicken, carrots day prior to symptom onset.   No teeth, full dentures.   No fever, sore throat, cough, congestion.   No trouble swallowing or breathing. No swelling of gums, purulent discharge. No blood.   History, background, results pertinent:   No dentist; seen by [facility]  No dental insurance.   A/P/next steps:  Problem List Items Addressed This Visit      Other   Sore gums - Primary    Discussed likely poor fit of dentures. Trial of magicmouth wash. Advised continued soft foods.  Declines referral to dentist and would like to trial medication first. She will let me know how she is doing and schedule sooner follow up if not resolved.           I spent 15 min  discussing plan of care over the phone.

## 2019-04-08 NOTE — Assessment & Plan Note (Addendum)
Discussed likely poor fit of dentures. Trial of magicmouth wash. Advised continued soft foods.  Declines referral to dentist and would like to trial medication first. She will let me know how she is doing and schedule sooner follow up if not resolved.

## 2019-04-08 NOTE — Patient Instructions (Addendum)
Trial of magic mouth wash.   Let me know if you are not better, otherwise, we will  Speak again on 04/16/19 at follow as planned.

## 2019-04-09 ENCOUNTER — Telehealth: Payer: Self-pay | Admitting: Family

## 2019-04-09 NOTE — Telephone Encounter (Signed)
Spoke w/ pt.  Pt is out of mouthwash.  Pt said that she was unable to pick up Rx for the magic mouthwash w/ lidocaine b/c pharmacy said there was a problem with prescription.  I spoke with pharmacy.  Pharmacy needs Rx resent with the total quantity of mouthwash and the quantity of lidocaine.

## 2019-04-09 NOTE — Telephone Encounter (Signed)
Medication Refill - Medication: magic mouthwash w/lidocaine SOLN (Pharmacy stated they never received prescription)   Has the patient contacted their pharmacy?Yes (Agent: If no, request that the patient contact the pharmacy for the refill.) (Agent: If yes, when and what did the pharmacy advise?)Contact Pcp  Preferred Pharmacy (with phone number or street name):  [id number] - [place], [place] - [phone] (Phone) [phone] (Fax)     Agent: Please be advised that RX refills may take up to 3 business days. We ask that you follow-up with your pharmacy.

## 2019-04-10 ENCOUNTER — Ambulatory Visit: Payer: Self-pay

## 2019-04-10 ENCOUNTER — Telehealth: Payer: Self-pay | Admitting: Family

## 2019-04-10 ENCOUNTER — Other Ambulatory Visit: Payer: Self-pay

## 2019-04-10 ENCOUNTER — Ambulatory Visit: Payer: Self-pay | Admitting: Family

## 2019-04-10 DIAGNOSIS — K1379 Other lesions of oral mucosa: Secondary | ICD-10-CM

## 2019-04-10 MED ORDER — CLOTRIMAZOLE 10 MG MT TROC: 10.0000 mg | Freq: Three times a day (TID) | OROMUCOSAL | 0 refills | Status: AC

## 2019-04-10 MED ORDER — MAGIC MOUTHWASH W/LIDOCAINE: ORAL | 0 refills | Status: DC

## 2019-04-10 MED ORDER — LIDOCAINE VISCOUS HCL 2 % MT SOLN: 15.0000 mL | OROMUCOSAL | 0 refills | Status: DC | PRN

## 2019-04-10 NOTE — Telephone Encounter (Signed)
Call pt  I sent in mycelex and also viscous lidocaine

## 2019-04-10 NOTE — Telephone Encounter (Signed)
Pt called about the status of the mouthwash and to see it is taking 2-3 days to get the issue taken care of/ please advise

## 2019-04-10 NOTE — Telephone Encounter (Signed)
I called & let patient not that prescription was sent.

## 2019-04-10 NOTE — Telephone Encounter (Signed)
I called & let patient know that new prescription was sent. I apologized for the delay & confusion.

## 2019-04-11 ENCOUNTER — Ambulatory Visit: Payer: Medicare Other

## 2019-04-17 ENCOUNTER — Ambulatory Visit (INDEPENDENT_AMBULATORY_CARE_PROVIDER_SITE_OTHER): Payer: Medicare Other | Admitting: Family

## 2019-04-17 ENCOUNTER — Other Ambulatory Visit: Payer: Self-pay

## 2019-04-17 ENCOUNTER — Ambulatory Visit (INDEPENDENT_AMBULATORY_CARE_PROVIDER_SITE_OTHER): Payer: Medicare Other

## 2019-04-17 ENCOUNTER — Encounter: Payer: Self-pay | Admitting: Family

## 2019-04-17 DIAGNOSIS — E039 Hypothyroidism, unspecified: Secondary | ICD-10-CM

## 2019-04-17 DIAGNOSIS — E1140 Type 2 diabetes mellitus with diabetic neuropathy, unspecified: Secondary | ICD-10-CM

## 2019-04-17 DIAGNOSIS — I10 Essential (primary) hypertension: Secondary | ICD-10-CM

## 2019-04-17 DIAGNOSIS — Z0000 Encounter for general adult medical examination without abnormal findings: Secondary | ICD-10-CM

## 2019-04-17 DIAGNOSIS — K1379 Other lesions of oral mucosa: Secondary | ICD-10-CM

## 2019-04-17 DIAGNOSIS — E785 Hyperlipidemia, unspecified: Secondary | ICD-10-CM

## 2019-04-17 MED ORDER — GLUCOSE BLOOD VI STRP: ORAL_STRIP | 4 refills | Status: DC

## 2019-04-17 MED ORDER — CLOTRIMAZOLE 10 MG MT TROC: 10.0000 mg | Freq: Three times a day (TID) | OROMUCOSAL | 0 refills | Status: AC

## 2019-04-17 MED ORDER — LOSARTAN POTASSIUM-HCTZ 50-12.5 MG PO TABS: 1.0000 | ORAL_TABLET | Freq: Every day | ORAL | 1 refills | Status: DC

## 2019-04-17 MED ORDER — METOPROLOL TARTRATE 25 MG PO TABS: 25.0000 mg | ORAL_TABLET | Freq: Two times a day (BID) | ORAL | 1 refills | Status: AC

## 2019-04-17 MED ORDER — GABAPENTIN 100 MG PO CAPS: 200.0000 mg | ORAL_CAPSULE | Freq: Three times a day (TID) | ORAL | 2 refills | Status: DC

## 2019-04-17 MED ORDER — ATORVASTATIN CALCIUM 20 MG PO TABS: ORAL_TABLET | ORAL | 0 refills | Status: DC

## 2019-04-17 MED ORDER — LEVOTHYROXINE SODIUM 137 MCG PO TABS: 137.0000 ug | ORAL_TABLET | Freq: Every day | ORAL | 1 refills | Status: DC

## 2019-04-17 MED ORDER — AMLODIPINE BESYLATE 10 MG PO TABS: ORAL_TABLET | ORAL | 3 refills | Status: DC

## 2019-04-17 MED ORDER — METFORMIN HCL 500 MG PO TABS: ORAL_TABLET | ORAL | 1 refills | Status: DC

## 2019-04-17 NOTE — Progress Notes (Signed)
Printed and mailed

## 2019-04-17 NOTE — Progress Notes (Signed)
Subjective:   Cynthia [name] is a 73 y.o. female who presents for Medicare Annual (Subsequent) preventive examination.  Review of Systems:  No ROS.  Medicare Wellness Virtual Visit.  Visual/audio telehealth visit, UTA vital signs.   See social history for additional risk factors.   Cardiac Risk Factors include: advanced age (>51men, >66 women);hypertension;diabetes mellitus     Objective:     Vitals: There were no vitals taken for this visit.  There is no height or weight on file to calculate BMI.  Advanced Directives 04/17/2019 03/12/2019 08/20/2018 04/09/2018 02/14/2018 01/23/2018 01/08/2018  Does Patient Have a Medical Advance Directive? No No No No No No No  Would patient like information on creating a medical advance directive? No - Patient declined - - No - Patient declined - No - Patient declined No - Patient declined    Tobacco Social History   Tobacco Use  Smoking Status Former Smoker  . Packs/day: 0.75  . Years: 48.00  . Pack years: 36.00  . Types: Cigarettes  . Quit date: 2015  . Years since quitting: 5.5  Smokeless Tobacco Former Systems developer  . Types: Snuff, Chew     Counseling given: Not Answered   Clinical Intake:  Pre-visit preparation completed: Yes        Diabetes: Yes(Followed by pcp)  How often do you need to have someone help you when you read instructions, pamphlets, or other written materials from your doctor or pharmacy?: 1 - Never  Interpreter Needed?: No     Past Medical History:  Diagnosis Date  . Arthritis   . 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   Marland Kitchen COPD (chronic obstructive pulmonary disease) ([place])   . Coronary artery disease   . Diabetes mellitus without complication ([place])   . Hyperlipidemia   . Hypertension   . Lung nodule   . Thyroid disease    Past Surgical History:  Procedure Laterality Date  . BREAST BIOPSY Left 11/25/2015   stereo  neg  . BREAST EXCISIONAL BIOPSY Left yrs ago   benign  . COLONOSCOPY WITH  PROPOFOL N/A 04/20/2017   Procedure: COLONOSCOPY WITH PROPOFOL;  Surgeon: [name], MD;  Location: [facility] ENDOSCOPY;  Service: Endoscopy;  Laterality: N/A;  . COLONOSCOPY WITH PROPOFOL N/A 01/08/2018   Procedure: COLONOSCOPY WITH PROPOFOL;  Surgeon: [name], MD;  Location: [facility] ENDOSCOPY;  Service: Gastroenterology;  Laterality: N/A;  . COLONOSCOPY WITH PROPOFOL N/A 08/10/2018   Procedure: COLONOSCOPY WITH PROPOFOL;  Surgeon: [name], MD;  Location: [facility] ENDOSCOPY;  Service: Gastroenterology;  Laterality: N/A;  . THYROIDECTOMY     Dr. [name]  . VAGINAL DELIVERY     4   Family History  Problem Relation Age of Onset  . Hypertension Mother   . Hypertension Father   . Diabetes Sister   . Thyroid disease Sister   . Breast cancer Maternal Aunt   . Breast cancer Maternal Aunt    Social History   Socioeconomic History  . Marital status: Married    Spouse name: Not on file  . Number of children: 4  . Years of education: Not on file  . Highest education level: Not on file  Occupational History  . Not on file  Social Needs  . Financial resource strain: Not hard at all  . Food insecurity    Worry: Never true    Inability: Never true  . Transportation needs    Medical: No    Non-medical: No  Tobacco Use  .  Smoking status: Former Smoker    Packs/day: 0.75    Years: 48.00    Pack years: 36.00    Types: Cigarettes    Quit date: 2015    Years since quitting: 5.5  . Smokeless tobacco: Former Systems developer    Types: Snuff, Chew  Substance and Sexual Activity  . Alcohol use: No  . Drug use: No  . Sexual activity: Never  Lifestyle  . Physical activity    Days per week: 5 days    Minutes per session: 30 min  . Stress: Not at all  Relationships  . Social Herbalist on phone: Not on file    Gets together: Not on file    Attends religious service: Not on file    Active member of club or organization: Not on file    Attends meetings of clubs or organizations: Not on file     Relationship status: Not on file  Other Topics Concern  . Not on file  Social History Narrative   Lives in [place] with husband. Has 4 children.      4 grandchildren.      Work - Retired from [employer]- walking the track, 4x per week      Diet- regular       Outpatient Encounter Medications as of 04/17/2019  Medication Sig  . acetaminophen (TYLENOL) 500 MG tablet Take 500 mg by mouth every 8 (eight) hours as needed.  Marland Kitchen amLODipine (NORVASC) 10 MG tablet TAKE 1 TABLET (10 MG TOTAL) BY MOUTH DAILY.  Marland Kitchen aspirin 81 MG tablet Take 81 mg by mouth daily.  Marland Kitchen atorvastatin (LIPITOR) 20 MG tablet TAKE 1 TABLET (20 MG TOTAL) BY MOUTH DAILY.  . Calcium Carbonate-Vit D-Min (CALCIUM 600+D PLUS MINERALS) 600-400 MG-UNIT TABS Take by mouth.  . Calcium Carbonate-Vitamin D 600-400 MG-UNIT per tablet Take 1 tablet by mouth daily.  . diclofenac sodium (VOLTAREN) 1 % GEL Apply 2 g topically 4 (four) times daily.  Marland Kitchen gabapentin (NEURONTIN) 100 MG capsule Take 1 capsule (100 mg total) by mouth 3 (three) times daily.  Marland Kitchen glucose blood (ONE TOUCH ULTRA TEST) test strip TEST BLOOD SUGAR 1-2 TIMES A DAY  . Lancets (ONETOUCH ULTRASOFT) lancets Use as instructed  . levothyroxine (SYNTHROID) 137 MCG tablet Take 1 tablet (137 mcg total) by mouth daily before breakfast.  . lidocaine (XYLOCAINE) 2 % solution Use as directed 15 mLs in the mouth or throat every 3 (three) hours as needed for mouth pain (gargle; may spit or swallow).  . losartan-hydrochlorothiazide (HYZAAR) 50-12.5 MG tablet Take 1 tablet by mouth daily.  . meloxicam (MOBIC) 7.5 MG tablet Take 1 tablet (7.5 mg total) by mouth daily as needed for pain. Take with food.  . metFORMIN (GLUCOPHAGE) 500 MG tablet Take one tablet by mouth two times a day.  . metoprolol tartrate (LOPRESSOR) 25 MG tablet Take 1 tablet (25 mg total) by mouth 2 (two) times daily.   No facility-administered encounter medications on file as of 04/17/2019.      Activities of Daily Living In your present state of health, do you have any difficulty performing the following activities: 04/17/2019  Hearing? N  Vision? N  Difficulty concentrating or making decisions? N  Walking or climbing stairs? N  Dressing or bathing? N  Doing errands, shopping? N  Preparing Food and eating ? N  Using the Toilet? N  In the past six months, have you accidently leaked urine?  N  Do you have problems with loss of bowel control? N  Managing your Medications? N  Managing your Finances? N  Housekeeping or managing your Housekeeping? N  Some recent data might be hidden    Patient Care Team: Burnard Hawthorne, FNP as PCP - General (Family Medicine) Jackolyn Confer, MD (Internal Medicine) Clent Jacks, RN as Registered Nurse    Assessment:   This is a routine wellness examination for [place].  I connected with patient 04/17/19 at  9:30 AM EDT by an audio enabled telemedicine application and verified that I am speaking with the correct person using two identifiers. Patient stated full name and DOB. Patient gave permission to continue with virtual visit. Patient's location was at home and Nurse's location was at [facility] office.   Health Screenings  Mammogram - 04/10/18 CT Chest Lung CA Screening- 01/2019  Colonoscopy - 07/2018 Bone Density - 04/2017 Glaucoma -none Hearing -demonstrates normal hearing during visit. Hemoglobin A1C - 09/2018 (6.6) Cholesterol - 06/2018  Dental- dentures [facility]- visits within the last 12 months; No retinopathy reported  Social  Alcohol intake - no        Smoking history- former    Smokers in home? none Illicit drug use? none Exercise - walking 5 days weekly, 30 minutes Diet - low carb Sexually Active -not currently BMI- discussed the importance of a healthy diet, water intake and the benefits of aerobic exercise.  Educational material provided.   Safety  Patient feels safe at home- yes Patient does have smoke  detectors at home- yes Patient does wear sunscreen or protective clothing when in direct sunlight -yes Patient does wear seat belt when in a moving vehicle -yes Patient drives- yes  [id number] precautions and sickness symptoms discussed.   Activities of Daily Living Patient denies needing assistance with: driving, household chores, feeding themselves, getting from bed to chair, getting to the toilet, bathing/showering, dressing, managing money, or preparing meals.  No new identified risk were noted.    Depression Screen Patient denies losing interest in daily life, feeling hopeless, or crying easily over simple problems.   Medication-taking as directed and without issues.   Fall Screen Patient denies being afraid of falling or falling in the last year.   Memory Screen Patient is alert.  Patient denies difficulty focusing, concentrating or misplacing items. Correctly identified the president of the [place], season and recall. Patient likes to read her Bible, completes crossword puzzles and plays with her granddaughter for brain stimulation.  Immunizations The following Immunizations were discussed: Influenza, shingles, pneumonia, and tetanus.   Other Providers Patient Care Team: Burnard Hawthorne, FNP as PCP - General (Family Medicine) Jackolyn Confer, MD (Internal Medicine) Clent Jacks, RN as Registered Nurse  Exercise Activities and Dietary recommendations Current Exercise Habits: Home exercise routine, Type of exercise: walking, Time (Minutes): 30, Frequency (Times/Week): 5, Weekly Exercise (Minutes/Week): 150, Intensity: Mild  Goals    . Follow up with Primary Care Provider     As needed       Fall Risk Fall Risk  04/17/2019 04/09/2018 03/13/2018 07/18/2017 04/07/2017  Falls in the past year? 0 Yes No No No  Number falls in past yr: - 1 - - -  Injury with Fall? - No - - -  Comment - She missed a step - - -  Follow up - Falls prevention discussed - - -   Is the  patient's home free of loose throw rugs in walkways, pet beds, electrical  cords, etc? yes      Grab bars in the bathroom? yes      Handrails on the stairs? yes      Adequate lighting? yes  Depression Screen PHQ 2/9 Scores 04/17/2019 04/09/2018 03/13/2018 07/18/2017  PHQ - 2 Score 0 0 0 0  PHQ- 9 Score - - - -     Cognitive Function MMSE - Mini Mental State Exam 04/07/2017 04/07/2016 04/02/2015  Orientation to time 5 5 5   Orientation to Place 5 5 5   Registration 3 3 3   Attention/ Calculation 2 5 5   Attention/Calculation-comments Difficulty performing simple calculation - -  Recall 3 3 3   Language- name 2 objects 2 2 2   Language- repeat 1 1 1   Language- follow 3 step command 3 3 3   Language- read & follow direction 1 1 1   Write a sentence 1 1 1   Copy design 1 1 1   Total score 27 30 30      6CIT Screen 04/17/2019 04/09/2018  What Year? 0 points 0 points  What month? 0 points 0 points  What time? 0 points 0 points  Count back from 20 0 points 0 points  Months in reverse 0 points 2 points  Repeat phrase 0 points -  Total Score 0 -    Immunization History  Administered Date(s) Administered  . Influenza, High Dose Seasonal PF 07/18/2017, 07/11/2018  . Influenza,inj,Quad PF,6+ Mos 06/13/2013, 06/13/2014, 07/03/2015, 05/11/2016  . Influenza-Unspecified 07/18/2012  . Pneumococcal Conjugate-13 03/07/2014  . Pneumococcal Polysaccharide-23 03/08/2013  . Tdap 05/17/2016   Screening Tests Health Maintenance  Topic Date Due  . FOOT EXAM  02/06/2019  . HEMOGLOBIN A1C  04/12/2019  . INFLUENZA VACCINE  04/20/2019  . OPHTHALMOLOGY EXAM  06/23/2019  . MAMMOGRAM  04/10/2020  . COLONOSCOPY  08/10/2021  . TETANUS/TDAP  05/17/2026  . DEXA SCAN  Completed  . Hepatitis C Screening  Completed  . PNA vac Low Risk Adult  Completed      Plan:   End of life planning; Advanced aging; Advanced directives discussed.  No HCPOA/Living Will.  Additional information declined at this time.  I have  personally reviewed and noted the following in the patient's chart:   . Medical and social history . Use of alcohol, tobacco or illicit drugs  . Current medications and supplements . Functional ability and status . Nutritional status . Physical activity . Advanced directives . List of other physicians . Hospitalizations, surgeries, and ER visits in previous 12 months . Vitals . Screenings to include cognitive, depression, and falls . Referrals and appointments  In addition, I have reviewed and discussed with patient certain preventive protocols, quality metrics, and best practice recommendations. A written personalized care plan for preventive services as well as general preventive health recommendations were provided to patient.     Varney Biles, LPN  [id number]/[id number]    Agree with plan. Mable Paris, NP

## 2019-04-17 NOTE — Assessment & Plan Note (Signed)
Worsening neuropathy. No open wounds. Increase gabapentin. Trial OTC Capsaicin. She will let me know how she is doing Pending  Labs.

## 2019-04-17 NOTE — Assessment & Plan Note (Addendum)
Concerned with poor denture fit; patient will trial Mycelex again for empiric thrush treatment and keep me posted on symptoms.

## 2019-04-17 NOTE — Patient Instructions (Addendum)
May start myclocex troche for potential thrush.   Call [facility] as concerned dentures are not fitting well.   Address: [address], [place], [place] [address]  Phone: [phone]  Increase gabapentin to 200mg  three times per day.  Ensure not to sleepy on this regimen.   Over the counter medication called Capsaicin that you use on our feet as well. Just use dollop of it first time to ensure not too hot. Do not put near your eyes and [place] after use.   Stay safe!

## 2019-04-17 NOTE — Progress Notes (Signed)
Verbal consent for services obtained from patient prior to services given.  Location of call:  provider at work patient at home  Names of all persons present for services: Mable Paris, NP Chief complaint:   CC: increased 'burning' bottom of feet and toes.   Worse during day. Taking gabapentin TID.   No wounds, cracks on feet. Has looked with mirror.   Completed troche and feels top gums soreness has resolved. Now noticing the bottom gums feel sore when puts dentures in. No purulent discharge, fever, N, V. Able to eat.   Dentures from [facility]- they are 73 years old. Thinks needs 'a new set.'   Mammogram scheduled for tomorrow.  A/P/next steps:  Problem List Items Addressed This Visit      Endocrine   Hypothyroidism   Relevant Orders   TSH     Nervous and Auditory   Type 2 diabetes mellitus with diabetic neuropathy, unspecified ([place])    Worsening neuropathy. No open wounds. Increase gabapentin. Trial OTC Capsaicin. She will let me know how she is doing Pending  Labs.      Relevant Medications   gabapentin (NEURONTIN) 100 MG capsule   Other Relevant Orders   Hemoglobin A1c   Lipid panel     Other   Hyperlipidemia   Mouth sore - Primary    Concerned with poor denture fit; patient will trial Mycelex again for empiric thrush treatment and keep me posted on symptoms.       Relevant Medications   clotrimazole (MYCELEX) 10 MG troche       I spent 15 min  discussing plan of care over the phone.

## 2019-04-17 NOTE — Progress Notes (Signed)
Fasting labs scheduled 05/08/19.

## 2019-04-17 NOTE — Patient Instructions (Addendum)
  Ms. Wareing , Thank you for taking time to come for your Medicare Wellness Visit. I appreciate your ongoing commitment to your health goals. Please review the following plan we discussed and let me know if I can assist you in the future.   These are the goals we discussed: Goals    . Follow up with Primary Care Provider     As needed       This is a list of the screening recommended for you and due dates:  Health Maintenance  Topic Date Due  . Complete foot exam   02/06/2019  . Hemoglobin A1C  04/12/2019  . Flu Shot  04/20/2019  . Eye exam for diabetics  06/23/2019  . Mammogram  04/10/2020  . Colon Cancer Screening  08/10/2021  . Tetanus Vaccine  05/17/2026  . DEXA scan (bone density measurement)  Completed  .  Hepatitis C: One time screening is recommended by Center for Disease Control  (CDC) for  adults born from [birth year] through 1965.   Completed  . Pneumonia vaccines  Completed

## 2019-04-18 ENCOUNTER — Ambulatory Visit
Admission: RE | Admit: 2019-04-18 | Discharge: 2019-04-18 | Disposition: A | Discharge status: Home / Self Care | Payer: Medicare Other | Source: Ambulatory Visit | Attending: Family | Admitting: Family

## 2019-04-18 DIAGNOSIS — Z1231 Encounter for screening mammogram for malignant neoplasm of breast: Secondary | ICD-10-CM | POA: Diagnosis present

## 2019-04-18 IMAGING — MG DIGITAL SCREENING BILATERAL MAMMOGRAM WITH TOMO AND CAD
6 of 10 series · 6 of 30 positions shown · non-contrast
Comparison: Previous exam(s).

CLINICAL DATA: Screening.

EXAM:
DIGITAL SCREENING BILATERAL MAMMOGRAM WITH TOMO AND CAD

[L MLO synth-2D]
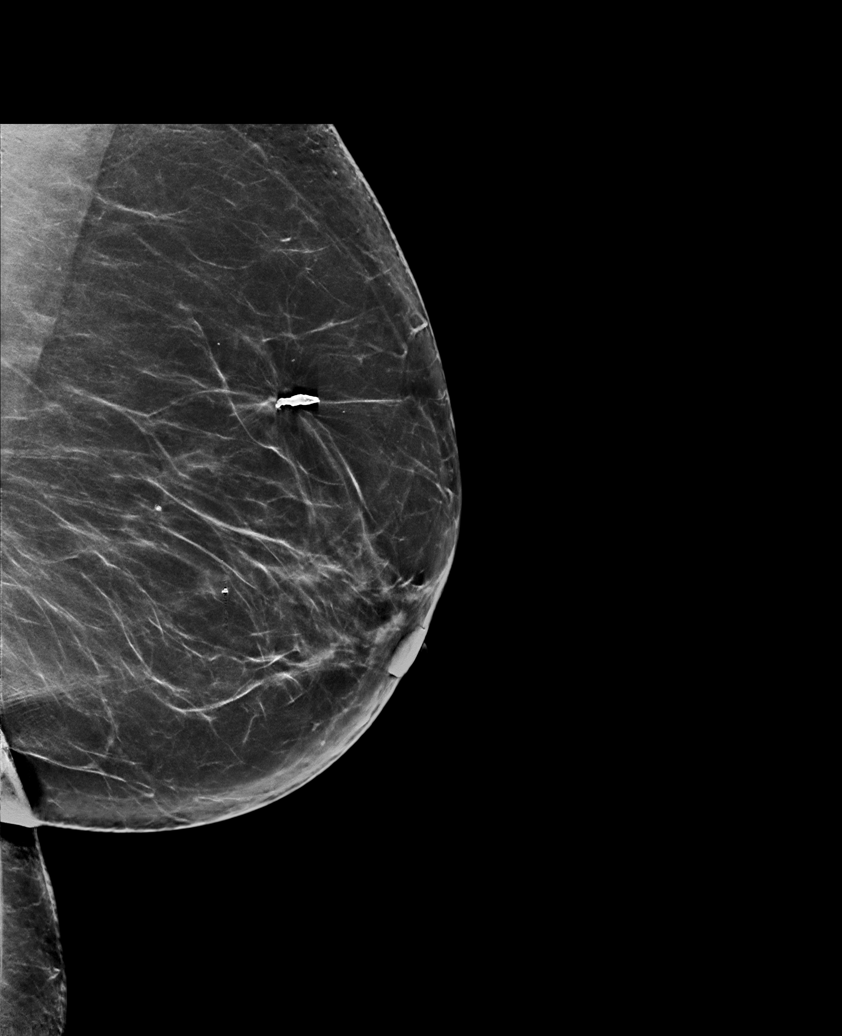

[R CC synth-2D (1 of 2)]
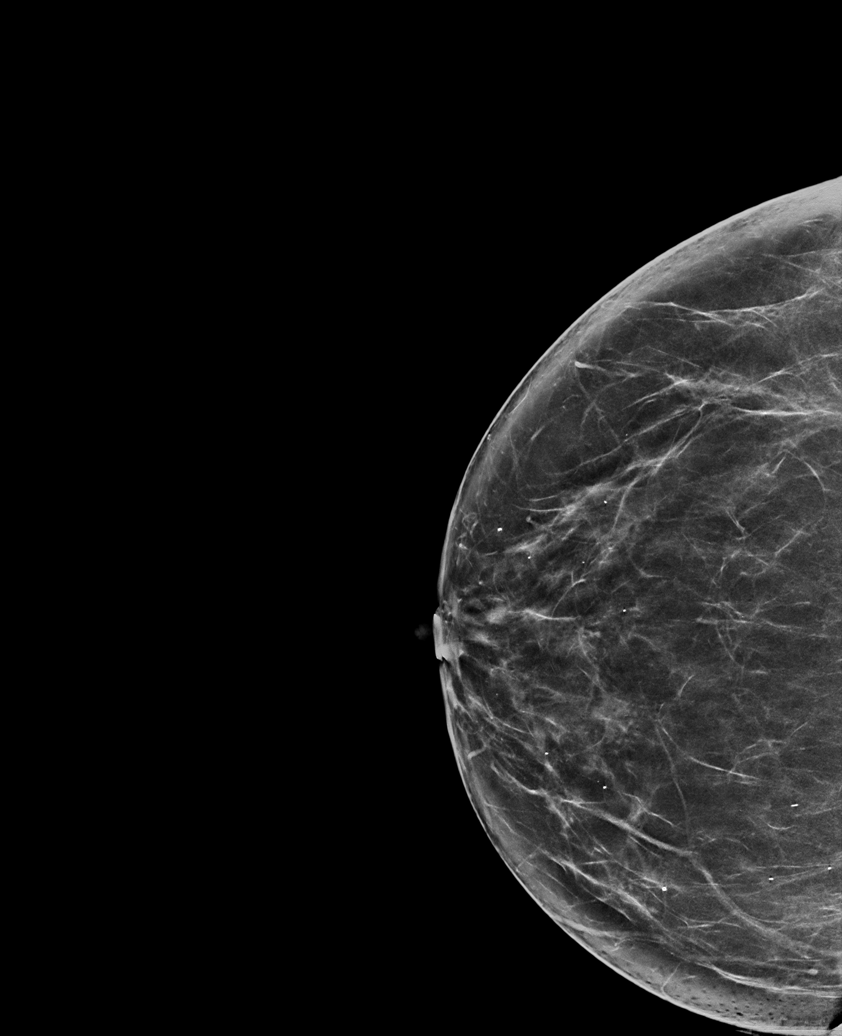

[R CC synth-2D (2 of 2)]
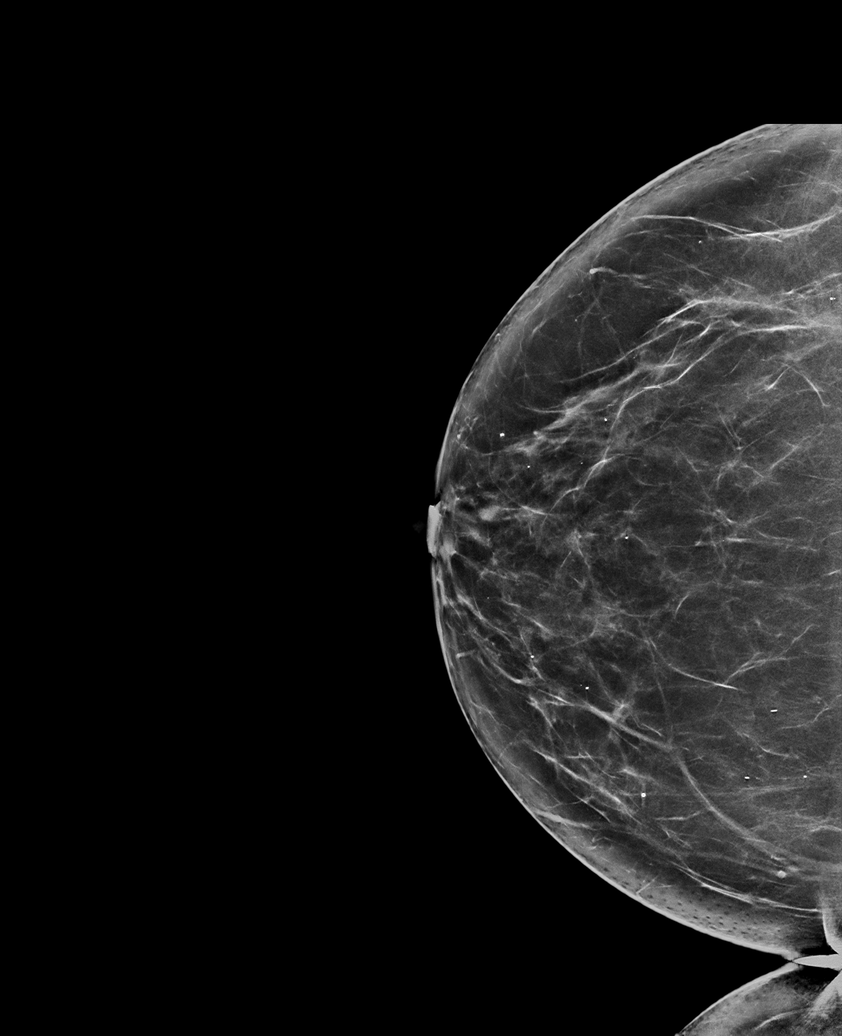

[L CC synth-2D]
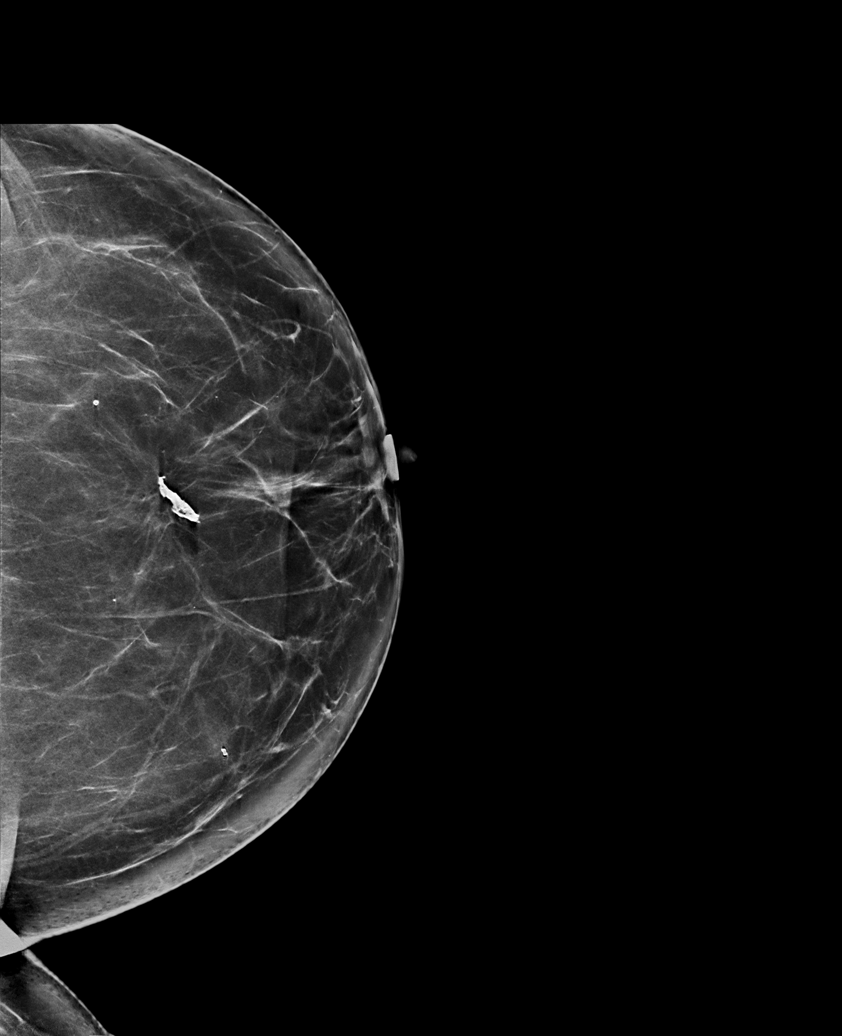

[R MLO synth-2D]
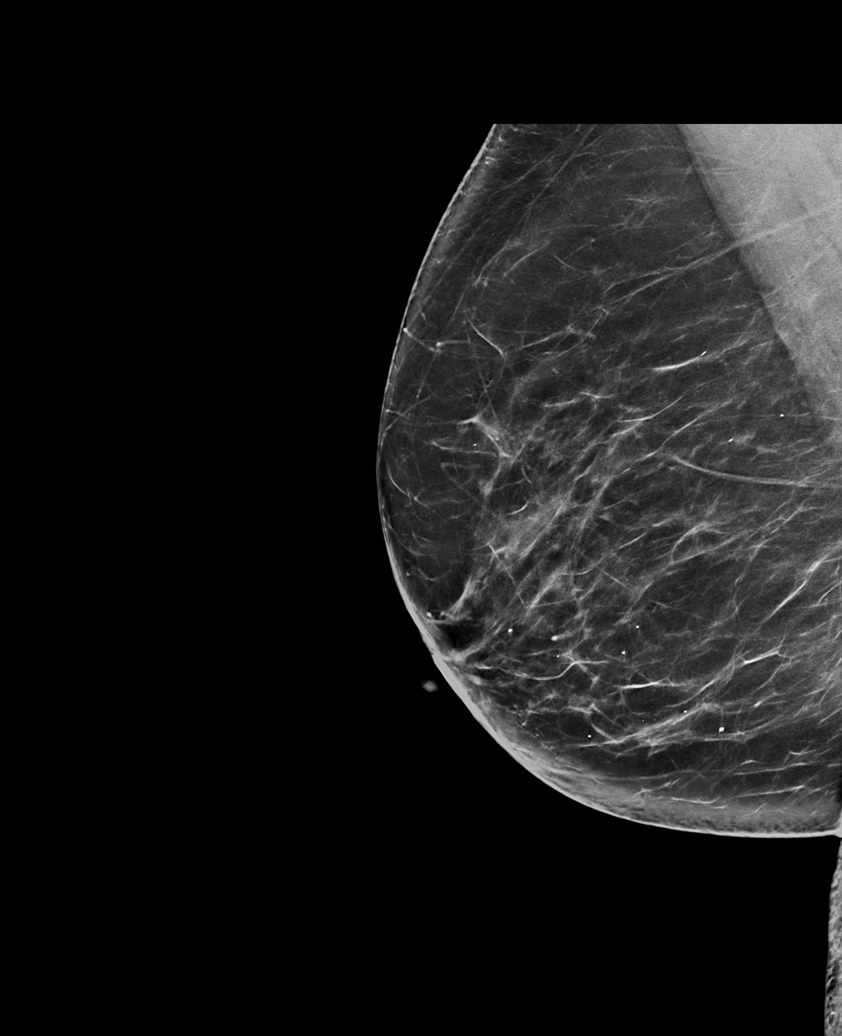

[L MLO tomo · tomo slice 44/87.0]
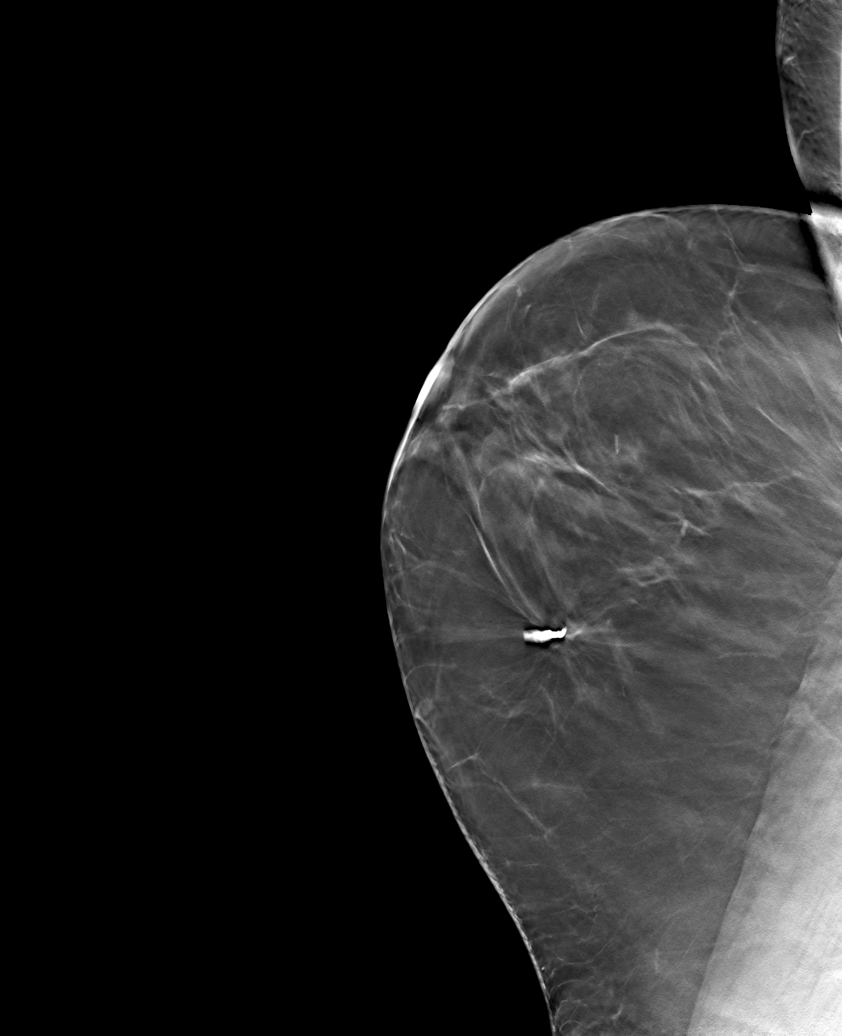

[6 of 30 positions shown; findings below may reference images not displayed]

ACR Breast Density Category b: There are scattered areas of
fibroglandular density.
FINDINGS: There are no findings suspicious for malignancy. Images were
processed with CAD.
IMPRESSION: No mammographic evidence of malignancy. A result letter of this
screening mammogram will be mailed directly to the patient.

RECOMMENDATION:
Screening mammogram in one year. (Code:[TQ])

BI-RADS CATEGORY  1: Negative.

## 2019-05-08 ENCOUNTER — Other Ambulatory Visit: Payer: Medicare Other

## 2019-05-22 ENCOUNTER — Other Ambulatory Visit: Payer: Self-pay

## 2019-05-22 ENCOUNTER — Other Ambulatory Visit (INDEPENDENT_AMBULATORY_CARE_PROVIDER_SITE_OTHER): Payer: Medicare Other

## 2019-05-22 DIAGNOSIS — E039 Hypothyroidism, unspecified: Secondary | ICD-10-CM

## 2019-05-22 DIAGNOSIS — E1140 Type 2 diabetes mellitus with diabetic neuropathy, unspecified: Secondary | ICD-10-CM

## 2019-05-22 LAB — LIPID PANEL
Cholesterol: 116 mg/dL (ref 0–200)
HDL: 57 mg/dL (ref >39.00)
LDL Cholesterol: 50 mg/dL (ref 0–99)
NonHDL: 59.38
Total CHOL/HDL Ratio: 2
Triglycerides: 46 mg/dL (ref 0.0–149.0)
VLDL: 9.2 mg/dL (ref 0.0–40.0)

## 2019-05-22 LAB — TSH: TSH: 0.36 u[IU]/mL (ref 0.35–4.50)

## 2019-05-22 LAB — HEMOGLOBIN A1C: Hgb A1c MFr Bld: 6.6 % — ABNORMAL HIGH (ref 4.6–6.5)

## 2019-05-24 ENCOUNTER — Other Ambulatory Visit: Payer: Self-pay

## 2019-05-24 ENCOUNTER — Encounter: Payer: Self-pay | Admitting: Family

## 2019-05-24 ENCOUNTER — Other Ambulatory Visit: Payer: Self-pay | Admitting: Family

## 2019-05-24 ENCOUNTER — Ambulatory Visit (INDEPENDENT_AMBULATORY_CARE_PROVIDER_SITE_OTHER): Payer: Medicare Other

## 2019-05-24 ENCOUNTER — Ambulatory Visit (INDEPENDENT_AMBULATORY_CARE_PROVIDER_SITE_OTHER): Payer: Medicare Other | Admitting: Family

## 2019-05-24 VITALS — BP 122/84 | HR 87 | Temp 98.3°F | Ht 67.95 in | Wt 233.0 lb

## 2019-05-24 DIAGNOSIS — M545 Low back pain, unspecified: Secondary | ICD-10-CM

## 2019-05-24 DIAGNOSIS — M79671 Pain in right foot: Secondary | ICD-10-CM | POA: Diagnosis not present

## 2019-05-24 DIAGNOSIS — E1140 Type 2 diabetes mellitus with diabetic neuropathy, unspecified: Secondary | ICD-10-CM

## 2019-05-24 DIAGNOSIS — M79672 Pain in left foot: Secondary | ICD-10-CM

## 2019-05-24 DIAGNOSIS — Z23 Encounter for immunization: Secondary | ICD-10-CM

## 2019-05-24 DIAGNOSIS — E785 Hyperlipidemia, unspecified: Secondary | ICD-10-CM

## 2019-05-24 LAB — URIC ACID: Uric Acid, Serum: 5.2 mg/dL (ref 2.4–7.0)

## 2019-05-24 IMAGING — DX DG LUMBAR SPINE COMPLETE 4+V
5 series · 5 of 5 positions shown · non-contrast
Comparison: Radiographs [DATE].

CLINICAL DATA: Acute left-sided low back pain.

EXAM:
LUMBAR SPINE - COMPLETE 4+ VIEW

[lumbar spine ap]
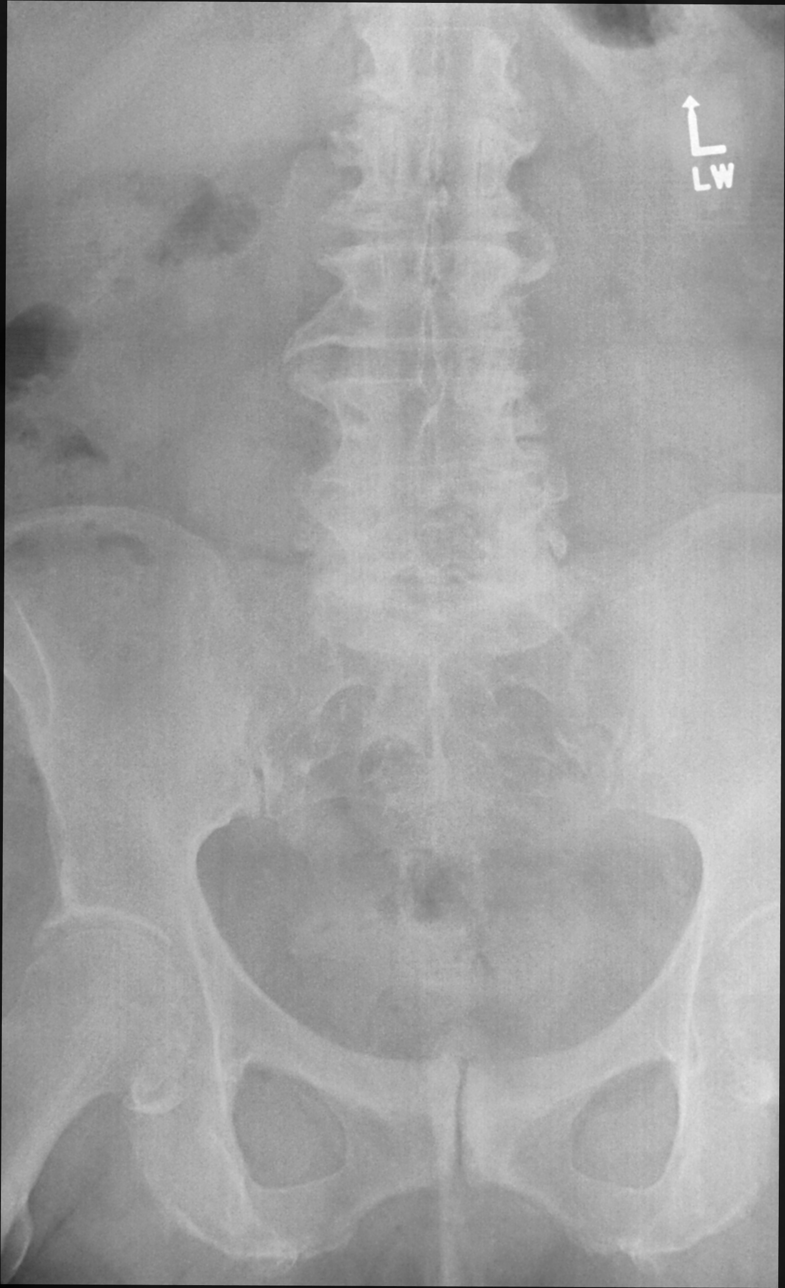

[lumbar spine obl (oblique) (1 of 2)]
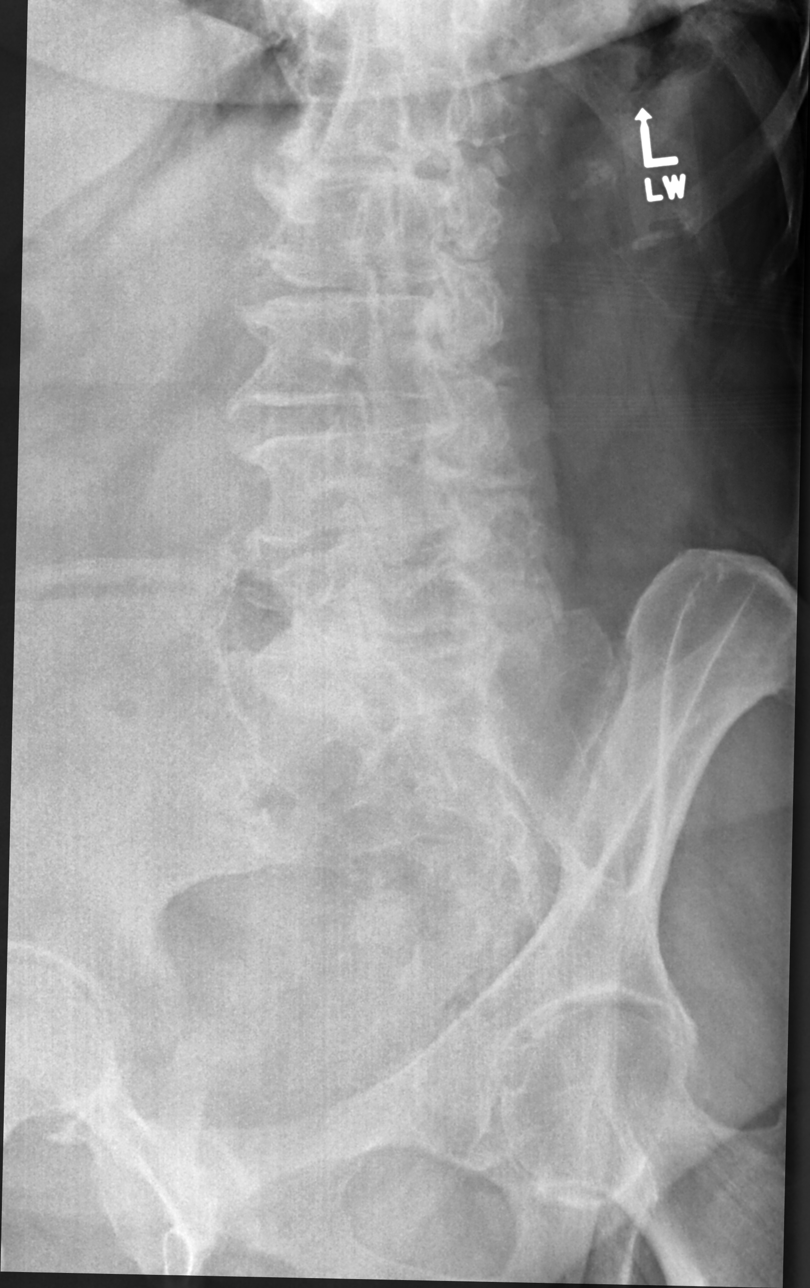

[lumbar spine obl (oblique) (2 of 2)]
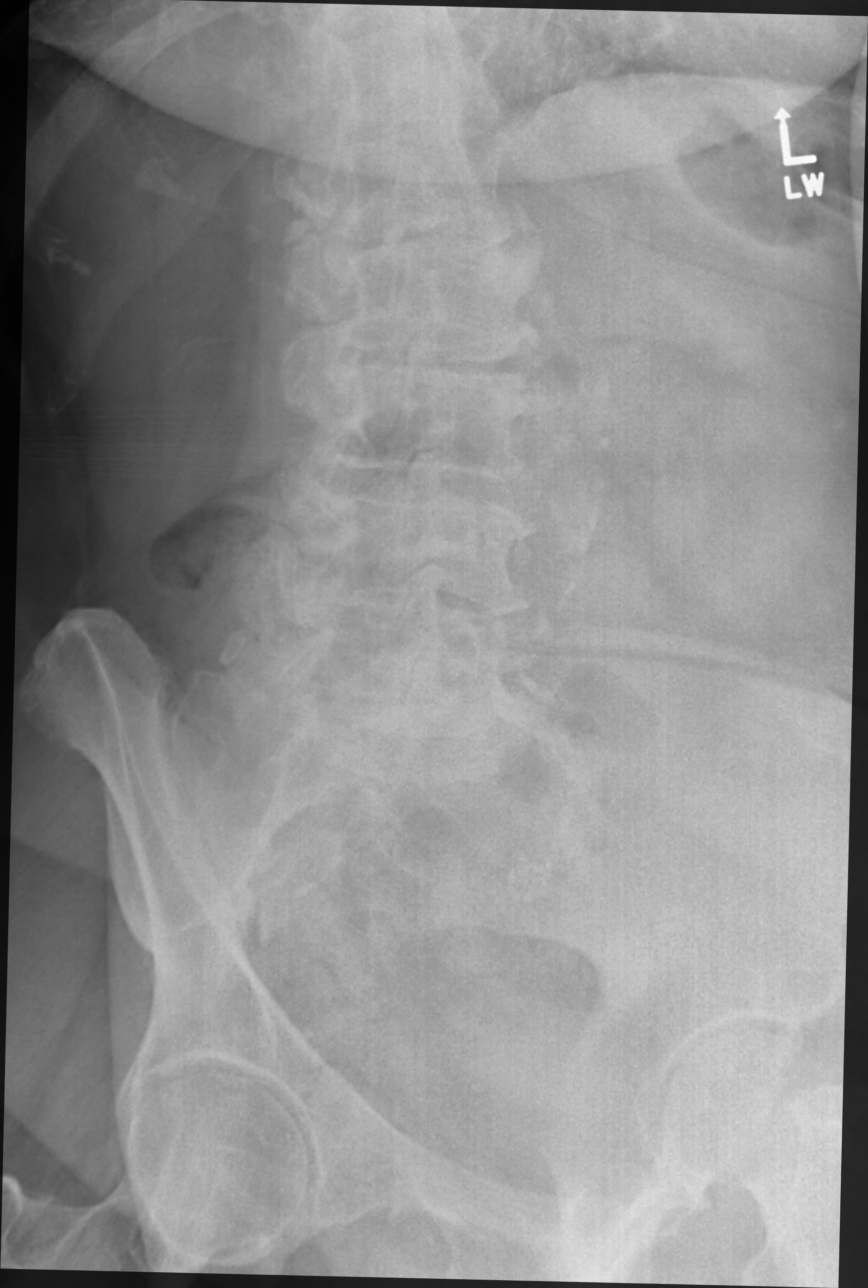

[lumbar spine lat]
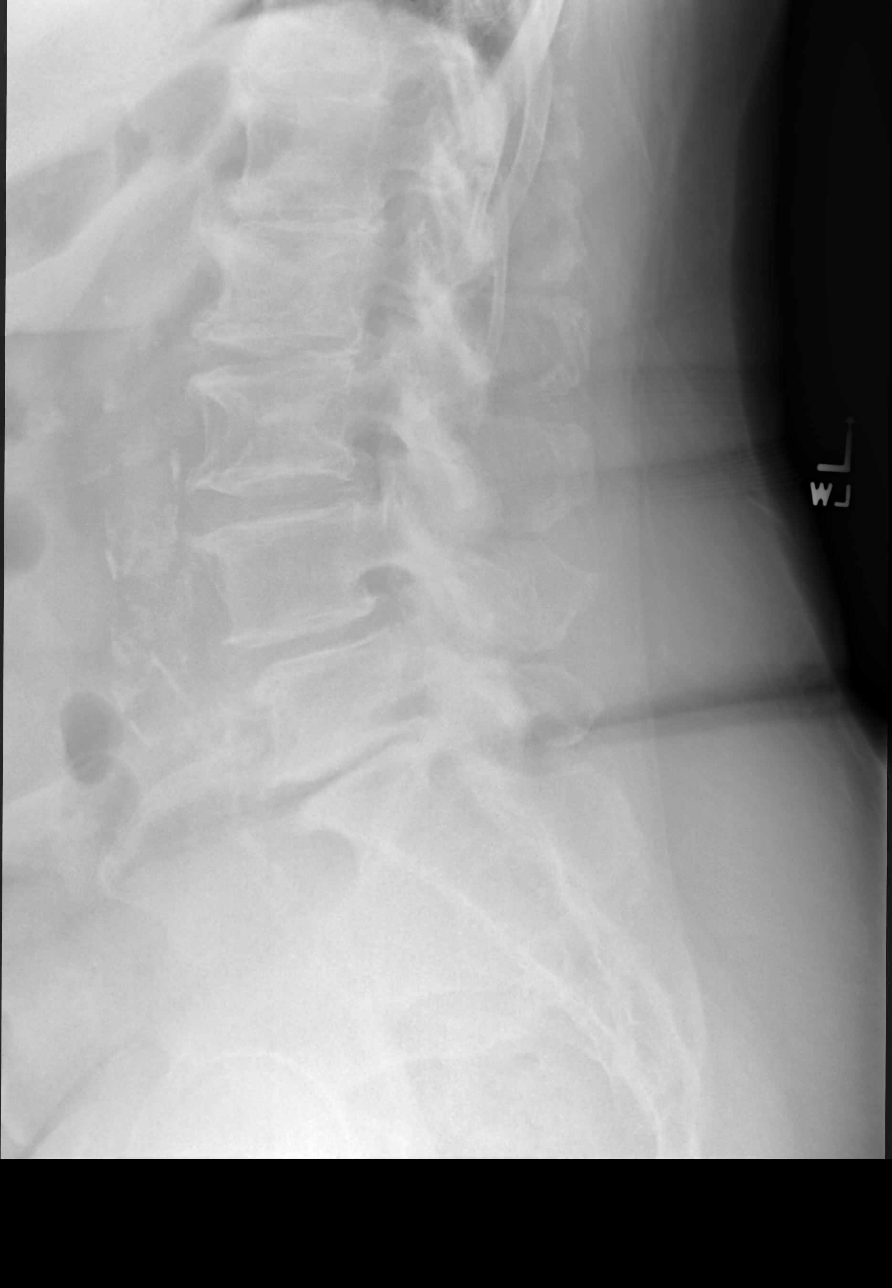

[lumbar spot lat]
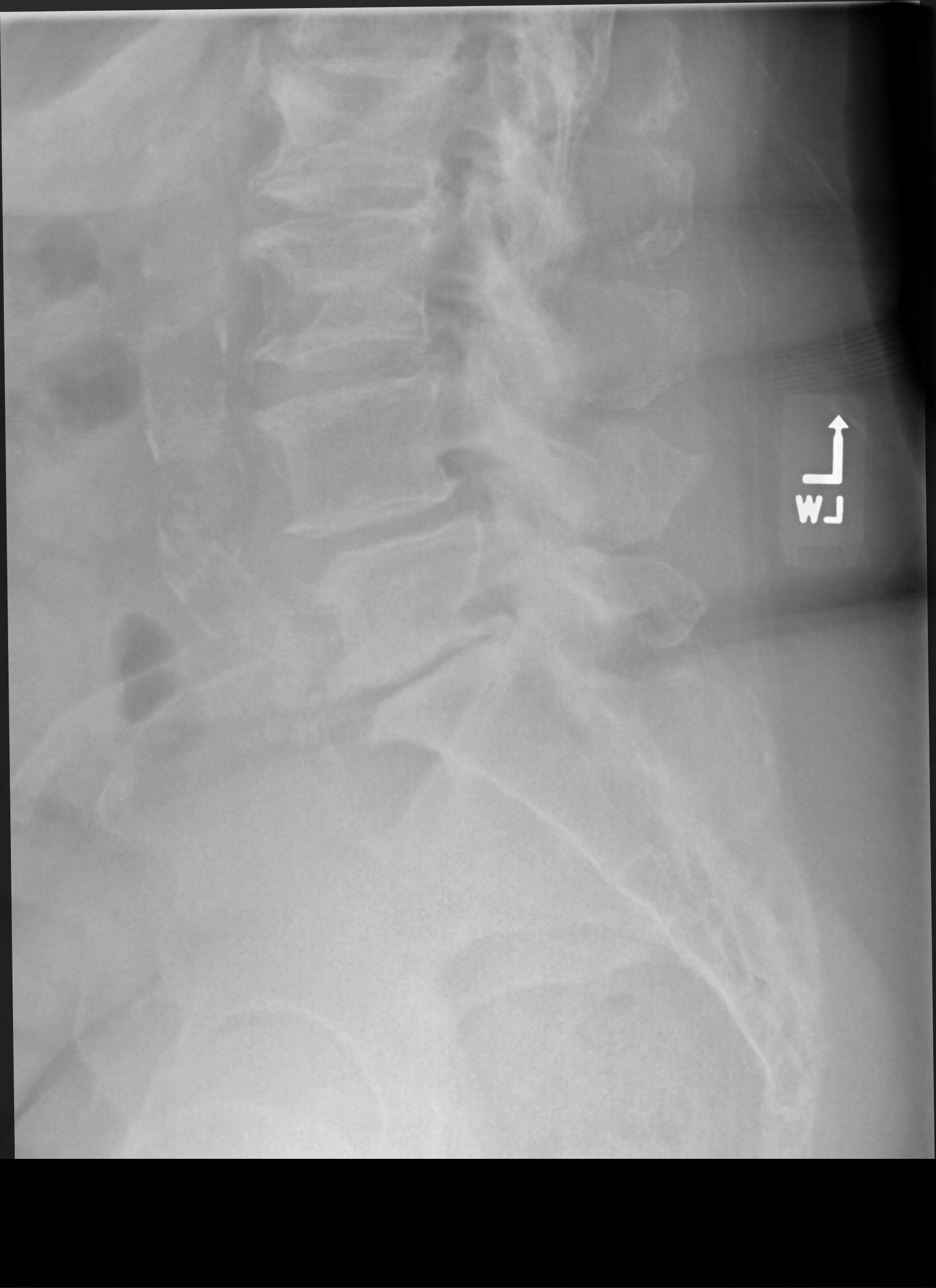

[5 of 5 positions shown; findings below may reference images not displayed]

FINDINGS: Minimal grade 1 anterolisthesis of L4-5 is noted secondary to
posterior facet joint hypertrophy. Moderate degenerative disc
disease is noted at L1-2, L4-5 and L5-S1. Atherosclerosis of
abdominal aorta is noted. No fracture is noted.
IMPRESSION: Moderate multilevel degenerative disc disease. No acute abnormality
seen in the lumbar spine.

Aortic Atherosclerosis ([M3]-[M3]).

## 2019-05-24 MED ORDER — ATORVASTATIN CALCIUM 20 MG PO TABS: ORAL_TABLET | ORAL | 0 refills | Status: DC

## 2019-05-24 MED ORDER — CLOTRIMAZOLE 10 MG MT TROC: 10.0000 mg | Freq: Three times a day (TID) | OROMUCOSAL | 0 refills | Status: DC

## 2019-05-24 MED ORDER — DULOXETINE HCL 30 MG PO CPEP: ORAL_CAPSULE | ORAL | 3 refills | Status: DC

## 2019-05-24 NOTE — Progress Notes (Signed)
Subjective:    Patient ID: Cynthia [name], female    DOB: [birth year], 73 y.o.   MRN: [id number]  CC: Cynthia [name] is a 73 y.o. female who presents today for an acute visit.    HPI: Chief complaint of bilateral chronic numbness in her feet.  She describes on the bottom of feet.  Significantly worse in her left foot closest to her left great toe.  She describes it as "fire".  It is burning all the time.  Has been taking gabapentin 200 mg twice a day with some relief however it does make her quite sleepy.  describes that she wears her "slides" at home when she is wearing sneakers and feels more comfortable.  She does not feel that she is walking on a pebble She has had no injury.  She denies any calf pain, calf swelling.  No wounds to the feet.  She is never seen podiatry.  No history of gout or kidney disease   She also mentions today that she has had some aching in her left hip this morning.  Started today.  Feels that she is in her left buttock.  There is no numbness or tingling.  She has no groin pain nor any problems urinating or having a bowel movement.  She had no recent falls.  No problems with her balance.  She does have meloxicam at home but has not tried any medications for this pain   Mammogram UTD   HISTORY:  Past Medical History:  Diagnosis Date  . Arthritis   . 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   Cynthia [name] COPD (chronic obstructive pulmonary disease) ([place])   . Coronary artery disease   . Diabetes mellitus without complication ([place])   . Hyperlipidemia   . Hypertension   . Lung nodule   . Thyroid disease    Past Surgical History:  Procedure Laterality Date  . BREAST BIOPSY Left 11/25/2015   stereo  neg  . BREAST EXCISIONAL BIOPSY Left yrs ago   benign  . COLONOSCOPY WITH PROPOFOL N/A 04/20/2017   Procedure: COLONOSCOPY WITH PROPOFOL;  Surgeon: Cynthia Bellows, MD;  Location: [facility] ENDOSCOPY;  Service: Endoscopy;  Laterality: N/A;  . COLONOSCOPY WITH PROPOFOL N/A  01/08/2018   Procedure: COLONOSCOPY WITH PROPOFOL;  Surgeon: Cynthia Bellows, MD;  Location: [facility] ENDOSCOPY;  Service: Gastroenterology;  Laterality: N/A;  . COLONOSCOPY WITH PROPOFOL N/A 08/10/2018   Procedure: COLONOSCOPY WITH PROPOFOL;  Surgeon: Cynthia Bellows, MD;  Location: [facility] ENDOSCOPY;  Service: Gastroenterology;  Laterality: N/A;  . THYROIDECTOMY     Dr. Leanora [name]  . VAGINAL DELIVERY     4   Family History  Problem Relation Age of Onset  . Hypertension Mother   . Hypertension Father   . Diabetes Sister   . Thyroid disease Sister   . Breast cancer Maternal Aunt   . Breast cancer Maternal Aunt     Allergies: Patient has no known allergies. Current Outpatient Medications on File Prior to Visit  Medication Sig Dispense Refill  . acetaminophen (TYLENOL) 500 MG tablet Take 500 mg by mouth every 8 (eight) hours as needed.    Cynthia [name] amLODipine (NORVASC) 10 MG tablet TAKE 1 TABLET (10 MG TOTAL) BY MOUTH DAILY. 90 tablet 3  . aspirin 81 MG tablet Take 81 mg by mouth daily.    Cynthia [name] atorvastatin (LIPITOR) 20 MG tablet TAKE 1 TABLET (20 MG TOTAL) BY MOUTH DAILY. 90 tablet 0  . Calcium Carbonate-Vit D-Min (CALCIUM 600+D  PLUS MINERALS) 600-400 MG-UNIT TABS Take by mouth.    . Calcium Carbonate-Vitamin D 600-400 MG-UNIT per tablet Take 1 tablet by mouth daily.    . diclofenac sodium (VOLTAREN) 1 % GEL Apply 2 g topically 4 (four) times daily.    Cynthia [name] glucose blood (ONE TOUCH ULTRA TEST) test strip TEST BLOOD SUGAR 1-2 TIMES A DAY 100 each 4  . Lancets (ONETOUCH ULTRASOFT) lancets Use as instructed 100 each 12  . levothyroxine (SYNTHROID) 137 MCG tablet Take 1 tablet (137 mcg total) by mouth daily before breakfast. 90 tablet 1  . losartan-hydrochlorothiazide (HYZAAR) 50-12.5 MG tablet Take 1 tablet by mouth daily. 90 tablet 1  . meloxicam (MOBIC) 7.5 MG tablet Take 1 tablet (7.5 mg total) by mouth daily as needed for pain. Take with food. 90 tablet 0  . metFORMIN (GLUCOPHAGE) 500 MG tablet Take one tablet by  mouth two times a day. 180 tablet 1  . metoprolol tartrate (LOPRESSOR) 25 MG tablet Take 1 tablet (25 mg total) by mouth 2 (two) times daily. 90 tablet 1   No current facility-administered medications on file prior to visit.     Social History   Tobacco Use  . Smoking status: Former Smoker    Packs/day: 0.75    Years: 48.00    Pack years: 36.00    Types: Cigarettes    Quit date: 2015    Years since quitting: 5.6  . Smokeless tobacco: Former Systems developer    Types: Snuff, Chew  Substance Use Topics  . Alcohol use: No  . Drug use: No    Review of Systems  Constitutional: Negative for chills and fever.  Respiratory: Negative for cough.   Cardiovascular: Negative for chest pain, palpitations and leg swelling.  Gastrointestinal: Negative for nausea and vomiting.  Musculoskeletal: Positive for back pain.  Skin: Negative for color change.  Neurological: Negative for numbness.  Psychiatric/Behavioral: Negative for sleep disturbance and suicidal ideas. The patient is not nervous/anxious.       Objective:    BP 122/84   Pulse 87   Temp 98.3 F (36.8 C)   Ht 5' 7.95" (1.726 m)   Wt 233 lb (105.7 kg)   SpO2 97%   BMI 35.48 kg/m    Physical Exam Vitals signs reviewed.  Constitutional:      Appearance: She is well-developed.  Eyes:     Conjunctiva/sclera: Conjunctivae normal.  Cardiovascular:     Rate and Rhythm: Normal rate and regular rhythm.     Pulses: Normal pulses.     Heart sounds: Normal heart sounds.  Pulmonary:     Effort: Pulmonary effort is normal.     Breath sounds: Normal breath sounds. No wheezing, rhonchi or rales.  Musculoskeletal:     Lumbar back: She exhibits normal range of motion, no tenderness, no bony tenderness, no swelling, no edema, no pain and no spasm.       Back:     Comments: Area of pain as described by patient, marked on diagram.  Full range of motion with flexion, tension, lateral side bends. No bony tenderness. No pain, numbness, tingling  elicited with single leg raise bilaterally.   Skin:    General: Skin is warm and dry.  Neurological:     Mental Status: She is alert.     Sensory: No sensory deficit.     Deep Tendon Reflexes:     Reflex Scores:      Patellar reflexes are 2+ on the right side and 2+  on the left side.    Comments: Sensation and strength intact bilateral lower extremities with microfilament test.  Psychiatric:        Speech: Speech normal.        Behavior: Behavior normal.        Thought Content: Thought content normal.        Assessment & Plan:   Problem List Items Addressed This Visit      Nervous and Auditory   Type 2 diabetes mellitus with diabetic neuropathy, unspecified ([place])    Worsened of late. D/c gabapentin. Trial of cymbalta with close follow up      Relevant Medications   DULoxetine (CYMBALTA) 30 MG capsule   Other Relevant Orders   Ambulatory referral to Podiatry     Other   Low back pain    New; started today Pending XR. Advised prn mobic. She will let me know if persistent so we can consult orthopedics.       Other Visit Diagnoses    Bilateral foot pain    -  Primary   Relevant Medications   DULoxetine (CYMBALTA) 30 MG capsule   Other Relevant Orders   DG Lumbar Spine Complete   Uric acid   Need for immunization against influenza       Relevant Orders   Flu Vaccine QUAD High Dose(Fluad) (Completed)         I have discontinued Enid Derry A. Fontenot's gabapentin. I am also having her start on DULoxetine. Additionally, I am having her maintain her Calcium Carbonate-Vitamin D, aspirin, Calcium 600+D Plus Minerals, meloxicam, onetouch ultrasoft, diclofenac sodium, acetaminophen, metoprolol tartrate, metFORMIN, levothyroxine, glucose blood, atorvastatin, amLODipine, and losartan-hydrochlorothiazide.   Meds ordered this encounter  Medications  . DULoxetine (CYMBALTA) 30 MG capsule    Sig: Take one 30 mg tablet by mouth once a day for the first week. Then increase to two  30 mg tablets ( total 60mg ) by mouth once daily.    Dispense:  60 capsule    Refill:  3    Order Specific Question:   Supervising Provider    Answer:   Crecencio Mc [[id number]]    Return precautions given.   Risks, benefits, and alternatives of the medications and treatment plan prescribed today were discussed, and patient expressed understanding.   Education regarding symptom management and diagnosis given to patient on AVS.  Continue to follow with Burnard Hawthorne, FNP for routine health maintenance.   Cynthia [name] and I agreed with plan.   Mable Paris, FNP

## 2019-05-24 NOTE — Assessment & Plan Note (Signed)
Worsened of late. D/c gabapentin. Trial of cymbalta with close follow up

## 2019-05-24 NOTE — Patient Instructions (Signed)
Follow up with my colleague Philis Nettle in one month  You may take occasional mobic for low back pain. Heating pad is very effective. Let me know if worsens , continues and you would like referral to orthopedics.   We will WEAN off the gabapentin since not effective and making you sleepy.   May start taking gabapentin 200 mg once per day; do this for 3-4 days. On the 5th day, you may start taking 100mg  for 1-2 days, and then you may stop medication  At that point, when OFF the gabapentin, you start the Cymbalta. It may take a couple of weeks to start to feel medication's effect.  You may try capsaicin cream which is over the counter for your feet as well  Today we discussed referrals, orders. podiatry   I have placed these orders in the system for you.  Please be sure to give [place] a call if you have not heard from our office regarding this. We should hear from [place] within ONE week with information regarding your appointment. If not, please let me know immediately.   Stay safe!

## 2019-05-24 NOTE — Telephone Encounter (Signed)
Medication Refill - Medication: atorvastatin (LIPITOR) 20 MG tablet    Preferred Pharmacy (with phone number or street name):  [id number] - , [place] - [address] [phone] (Phone) [phone] (Fax)

## 2019-05-24 NOTE — Assessment & Plan Note (Signed)
New; started today Pending XR. Advised prn mobic. She will let me know if persistent so we can consult orthopedics.

## 2019-06-13 ENCOUNTER — Ambulatory Visit (INDEPENDENT_AMBULATORY_CARE_PROVIDER_SITE_OTHER): Payer: Medicare Other | Admitting: Podiatry

## 2019-06-13 ENCOUNTER — Other Ambulatory Visit: Payer: Self-pay

## 2019-06-13 ENCOUNTER — Encounter: Payer: Self-pay | Admitting: Podiatry

## 2019-06-13 DIAGNOSIS — M79674 Pain in right toe(s): Secondary | ICD-10-CM | POA: Diagnosis not present

## 2019-06-13 DIAGNOSIS — B351 Tinea unguium: Secondary | ICD-10-CM

## 2019-06-13 DIAGNOSIS — E1140 Type 2 diabetes mellitus with diabetic neuropathy, unspecified: Secondary | ICD-10-CM | POA: Diagnosis not present

## 2019-06-13 DIAGNOSIS — M79675 Pain in left toe(s): Secondary | ICD-10-CM | POA: Diagnosis not present

## 2019-06-13 NOTE — Progress Notes (Signed)
This patient presents to the office with chief complaint of long thick nails and diabetic feet.  This patient presents the office on the recommendation of her doctor for her long thick nails.  During treatment of the long thick nails she had multiple questions concerning her neuropathy..  This patient says there are long thick painful nails.  These nails are painful walking and wearing shoes.  Patient has no history of infection or drainage from both feet.  Patient is unable to  self treat his own nails . This patient presents  to the office today for treatment of the  long nails and a foot evaluation due to history of  diabetes.  General Appearance  Alert, conversant and in no acute stress.  Vascular  Dorsalis pedis and posterior tibial  pulses are palpable  bilaterally.  Capillary return is within normal limits  bilaterally. Temperature is within normal limits  bilaterally.  Neurologic  Senn-Weinstein monofilament wire test within normal limits  bilaterally. Muscle power within normal limits bilaterally.  Nails Thick disfigured discolored nails with subungual debris  from hallux to fifth toes bilaterally. No evidence of bacterial infection or drainage bilaterally.  Orthopedic  No limitations of motion of motion feet .  No crepitus or effusions noted.  No bony pathology or digital deformities noted. [place]  HAV  B/L. Skin  normotropic skin with no porokeratosis noted bilaterally.  No signs of infections or ulcers noted.     Onychomycosis  Diabetes with no foot neuropathy.  IE  Debride nails x 10.  A diabetic foot exam was performed and there is no evidence of any vascular or neurologic pathology.  This patient states she still having numbness and tingling in her toes.  She says that her medical doctor told her to increase her dosage but she has not increased her dosage.  She says her doctor also gave her Celebrex which she refused to take due to the side effects.  I told this patient she should follow  the instructions of her medical doctor concerning the gabapentin.  She was told to return to the clinic in 3 months for preventative foot care services.   Gardiner Barefoot DPM

## 2019-07-03 ENCOUNTER — Other Ambulatory Visit: Payer: Self-pay

## 2019-07-05 ENCOUNTER — Other Ambulatory Visit: Payer: Self-pay

## 2019-07-05 ENCOUNTER — Ambulatory Visit (INDEPENDENT_AMBULATORY_CARE_PROVIDER_SITE_OTHER): Payer: Medicare Other | Admitting: Family Medicine

## 2019-07-05 ENCOUNTER — Ambulatory Visit: Payer: Medicare Other | Admitting: Family Medicine

## 2019-07-05 VITALS — BP 120/78 | HR 84 | Temp 97.3°F | Ht 67.95 in | Wt 234.0 lb

## 2019-07-05 DIAGNOSIS — M79671 Pain in right foot: Secondary | ICD-10-CM | POA: Diagnosis not present

## 2019-07-05 DIAGNOSIS — M79672 Pain in left foot: Secondary | ICD-10-CM | POA: Diagnosis not present

## 2019-07-05 MED ORDER — GABAPENTIN 100 MG PO CAPS: 100.0000 mg | ORAL_CAPSULE | Freq: Four times a day (QID) | ORAL | 3 refills | Status: DC

## 2019-07-05 NOTE — Progress Notes (Signed)
Subjective:    Patient ID: Cynthia [name], female    DOB: 03/24/1946, 73 y.o.   MRN: [id number]  HPI   Patient presents to clinic for follow-up on medications.  She was supposed to start Cymbalta after her last visit in early September to help better control bilateral foot pain.  Patient however was fearful of starting this medication after reading the side effect handout with her prescription.  Instead patient decided to continue meloxicam daily and gabapentin 400 to 600 mg every day.  Patient states the side effects listed was a very long list that she became nervous about the new medicine.  Continues to have chronic bilateral foot pain most likely related to diabetes and other multiple chronic vascular diseases.  Patient Active Problem List   Diagnosis Date Noted  . Pain due to onychomycosis of toenails of both feet 06/13/2019  . Low back pain 05/24/2019  . Sore gums 04/08/2019  . Atherosclerosis of aorta ([place]) 02/15/2019  . Former smoker 01/11/2019  . GERD (gastroesophageal reflux disease) 01/11/2019  . Chronic pain of both knees 10/12/2018  . COPD exacerbation ([place]) 03/13/2018  . Mouth sore 02/05/2018  . 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 Acute pain of left foot 07/30/2017  . Screening for breast cancer 02/15/2017  . Screen for colon cancer 02/15/2017  . Routine general medical examination at a health care facility 10/21/2015  . Medicare annual wellness visit, subsequent 03/08/2013  . Abnormal CT scan, chest 11/28/2012  . Hypertension 10/18/2012  . Arthritis 10/18/2012  . Type 2 diabetes mellitus with diabetic neuropathy, unspecified ([place]) 10/18/2012  . Hypothyroidism 10/18/2012  . Hyperlipidemia 10/18/2012   Social History   Tobacco Use  . Smoking status: Former Smoker    Packs/day: 0.75    Years: 48.00    Pack years: 36.00    Types: Cigarettes    Quit date: 2015    Years since quitting: 5.7  . Smokeless tobacco: Former Systems developer    Types:  Snuff, Chew  Substance Use Topics  . Alcohol use: No   Review of Systems   Constitutional: Negative for chills, fatigue and fever.  HENT: Negative for congestion, ear pain, sinus pain and sore throat.   Eyes: Negative.   Respiratory: Negative for cough, shortness of breath and wheezing.   Cardiovascular: Negative for chest pain, palpitations and leg swelling.  Gastrointestinal: Negative for abdominal pain, diarrhea, nausea and vomiting.  Genitourinary: Negative for dysuria, frequency and urgency.  Musculoskeletal: +bilateral foot pain Skin: Negative for color change, pallor and rash.  Neurological: Negative for syncope, light-headedness and headaches.  Psychiatric/Behavioral: The patient is not nervous/anxious.       Objective:   Physical Exam Vitals signs and nursing note reviewed.  Constitutional:      General: She is not in acute distress.    Appearance: She is not ill-appearing, toxic-appearing or diaphoretic.  HENT:     Head: Normocephalic and atraumatic.  Eyes:     General: No scleral icterus.    Extraocular Movements: Extraocular movements intact.     Pupils: Pupils are equal, round, and reactive to light.  Neck:     Musculoskeletal: Normal range of motion and neck supple. No neck rigidity.  Cardiovascular:     Rate and Rhythm: Normal rate and regular rhythm.     Heart sounds: Normal heart sounds.  Pulmonary:     Effort: Pulmonary effort is normal. No respiratory distress.     Breath sounds: Normal breath sounds.  Musculoskeletal:  Right lower leg: No edema.     Left lower leg: No edema.  Skin:    General: Skin is warm and dry.     Capillary Refill: Capillary refill takes less than 2 seconds.     Coloration: Skin is not jaundiced or pale.  Neurological:     Mental Status: She is alert and oriented to person, place, and time.     Gait: Gait normal.  Psychiatric:        Mood and Affect: Mood normal.        Behavior: Behavior normal.    Today's Vitals    07/05/19 0832  BP: 120/78  Pulse: 84  Temp: (!) 97.3 F (36.3 C)  TempSrc: Temporal  SpO2: 98%  Weight: 234 lb (106.1 kg)  Height: 5' 7.95" (1.726 m)   Body mass index is 35.63 kg/m.    Assessment & Plan:    Bilateral foot pain - Long discussion with patient regards to how medications and side effects and risks.  Advised patient to continue with meloxicam could possibly increase risk for GI bleeding or cardiovascular issues, gabapentin at high doses can cause drowsiness.  Patient verbalizes understanding to all medications have risks.  Advised patient that I would strongly recommend that she only take gabapentin at the 400 mg dose as she had market discussed, start the Cymbalta at 30 mg/day and use meloxicam sparingly.  Patient will try Cymbalta.  She will follow-up in another 4 to 6 weeks for recheck.

## 2019-07-05 NOTE — Patient Instructions (Signed)
Use meloxicam only as needed on occasion, no everyday use  Gabapentin 100 mg, 4 times per day  Start cymbalta 30 mg per day as you and margaret discussed

## 2019-07-24 ENCOUNTER — Encounter: Payer: Self-pay | Admitting: Family Medicine

## 2019-07-24 ENCOUNTER — Other Ambulatory Visit: Payer: Self-pay

## 2019-07-24 ENCOUNTER — Ambulatory Visit (INDEPENDENT_AMBULATORY_CARE_PROVIDER_SITE_OTHER): Payer: Medicare Other | Admitting: Family Medicine

## 2019-07-24 VITALS — BP 112/74 | HR 87 | Temp 96.5°F | Wt 233.0 lb

## 2019-07-24 DIAGNOSIS — M25551 Pain in right hip: Secondary | ICD-10-CM | POA: Diagnosis not present

## 2019-07-24 NOTE — Progress Notes (Signed)
Subjective:    Patient ID: Cynthia [name], female    DOB: 04-08-1946, 73 y.o.   MRN: [id number]  HPI   Patient presents to clinic complaining of right hip pain.  Patient has had arthritis flareup in multiple joints off and on for many years.  She does have meloxicam as needed, tries not to use it however due to her diabetes hx and wanting to protect kidneys.  Patient does not believe she has not seen orthopedic.  No recent imaging of hip seen in chart.  Patient Active Problem List   Diagnosis Date Noted  . Pain due to onychomycosis of toenails of both feet 06/13/2019  . Low back pain 05/24/2019  . Sore gums 04/08/2019  . Atherosclerosis of aorta ([place]) 02/15/2019  . Former smoker 01/11/2019  . GERD (gastroesophageal reflux disease) 01/11/2019  . Chronic pain of both knees 10/12/2018  . COPD exacerbation ([place]) 03/13/2018  . Mouth sore 02/05/2018  . 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 Acute pain of left foot 07/30/2017  . Screening for breast cancer 02/15/2017  . Screen for colon cancer 02/15/2017  . Routine general medical examination at a health care facility 10/21/2015  . Medicare annual wellness visit, subsequent 03/08/2013  . Abnormal CT scan, chest 11/28/2012  . Hypertension 10/18/2012  . Arthritis 10/18/2012  . Type 2 diabetes mellitus with diabetic neuropathy, unspecified ([place]) 10/18/2012  . Hypothyroidism 10/18/2012  . Hyperlipidemia 10/18/2012   Social History   Tobacco Use  . Smoking status: Former Smoker    Packs/day: 0.75    Years: 48.00    Pack years: 36.00    Types: Cigarettes    Quit date: 2015    Years since quitting: 5.8  . Smokeless tobacco: Former Systems developer    Types: Snuff, Chew  Substance Use Topics  . Alcohol use: No   Review of Systems  Constitutional: Negative for chills, fatigue and fever.  HENT: Negative for congestion, ear pain, sinus pain and sore throat.   Eyes: Negative.   Respiratory: Negative for cough, shortness of  breath and wheezing.   Cardiovascular: Negative for chest pain, palpitations and leg swelling.  Gastrointestinal: Negative for abdominal pain, diarrhea, nausea and vomiting.  Genitourinary: Negative for dysuria, frequency and urgency.  Musculoskeletal: +right hip pain Skin: Negative for color change, pallor and rash.  Neurological: Negative for syncope, light-headedness and headaches.  Psychiatric/Behavioral: The patient is not nervous/anxious.       Objective:   Physical Exam Vitals signs and nursing note reviewed.  Constitutional:      General: She is not in acute distress.    Appearance: She is not toxic-appearing.  HENT:     Head: Normocephalic and atraumatic.  Cardiovascular:     Rate and Rhythm: Normal rate and regular rhythm.  Pulmonary:     Effort: Pulmonary effort is normal. No respiratory distress.     Breath sounds: Normal breath sounds.  Musculoskeletal:     Right hip: She exhibits tenderness.     Right lower leg: No edema.     Left lower leg: No edema.     Comments: Pain in right hip joint with straight leg raises, adduction and abduction of leg @ hip joint.   Neurological:     Mental Status: She is alert and oriented to person, place, and time.     Gait: Gait normal.  Psychiatric:        Mood and Affect: Mood normal.        Behavior:  Behavior normal.    Today's Vitals   07/24/19 0909  BP: 112/74  Pulse: 87  Temp: (!) 96.5 F (35.8 C)  SpO2: 98%  Weight: 233 lb (105.7 kg)   Body mass index is 35.48 kg/m.     Assessment & Plan:    Right hip pain - Plan: DG HIP UNILAT WITH PELVIS MIN 4 VIEWS RIGHT, Ambulatory referral to Orthopedic Surgery  We will give referral to orthopedics for further evaluation and follow-up of right hip pain.  Wondering if patient potentially would benefit from steroid injection into the hip joint.  Patient encouraged to do gentle stretching range of motion exercises.  She will use Tylenol or meloxicam on a as needed basis.  Also  discussed use of heating pad or topical rubs like BenGay or Biofreeze on sore joints.  We will get an updated x-ray of right hip to look at joint space.  She will keep regular follow up with PCP as planned

## 2019-07-24 NOTE — Patient Instructions (Signed)

## 2019-07-26 ENCOUNTER — Other Ambulatory Visit: Payer: Medicare Other

## 2019-07-26 ENCOUNTER — Other Ambulatory Visit: Payer: Self-pay | Admitting: Family Medicine

## 2019-07-26 ENCOUNTER — Ambulatory Visit (INDEPENDENT_AMBULATORY_CARE_PROVIDER_SITE_OTHER)
Admission: RE | Admit: 2019-07-26 | Discharge: 2019-07-26 | Disposition: A | Discharge status: Home / Self Care | Payer: Medicare Other | Source: Ambulatory Visit | Attending: Family Medicine | Admitting: Family Medicine

## 2019-07-26 DIAGNOSIS — M25551 Pain in right hip: Secondary | ICD-10-CM

## 2019-07-26 IMAGING — DX DG HIP (WITH OR WITHOUT PELVIS) 2-3V*R*
3 series · 3 of 3 positions shown · non-contrast
Comparison: None.

CLINICAL DATA: Right hip pain

EXAM:
DG HIP (WITH OR WITHOUT PELVIS) 2-3V RIGHT

[pelvis ap]
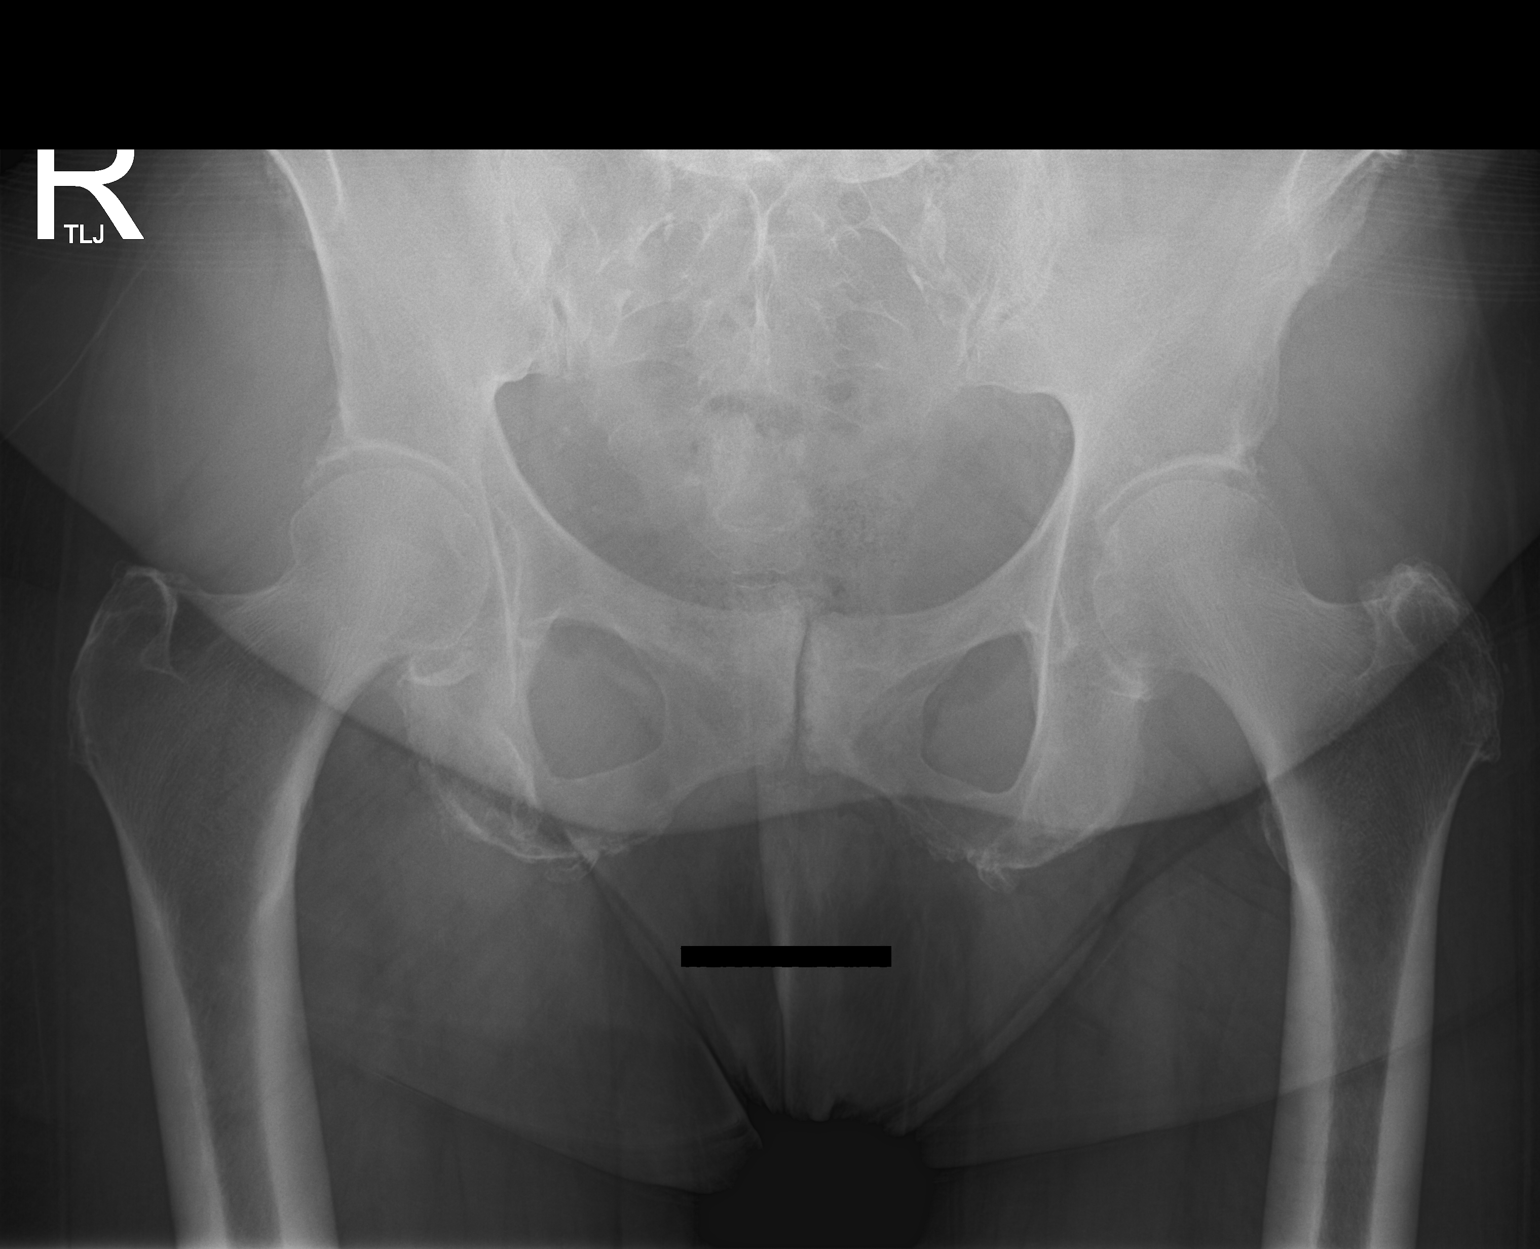

[hip ap]
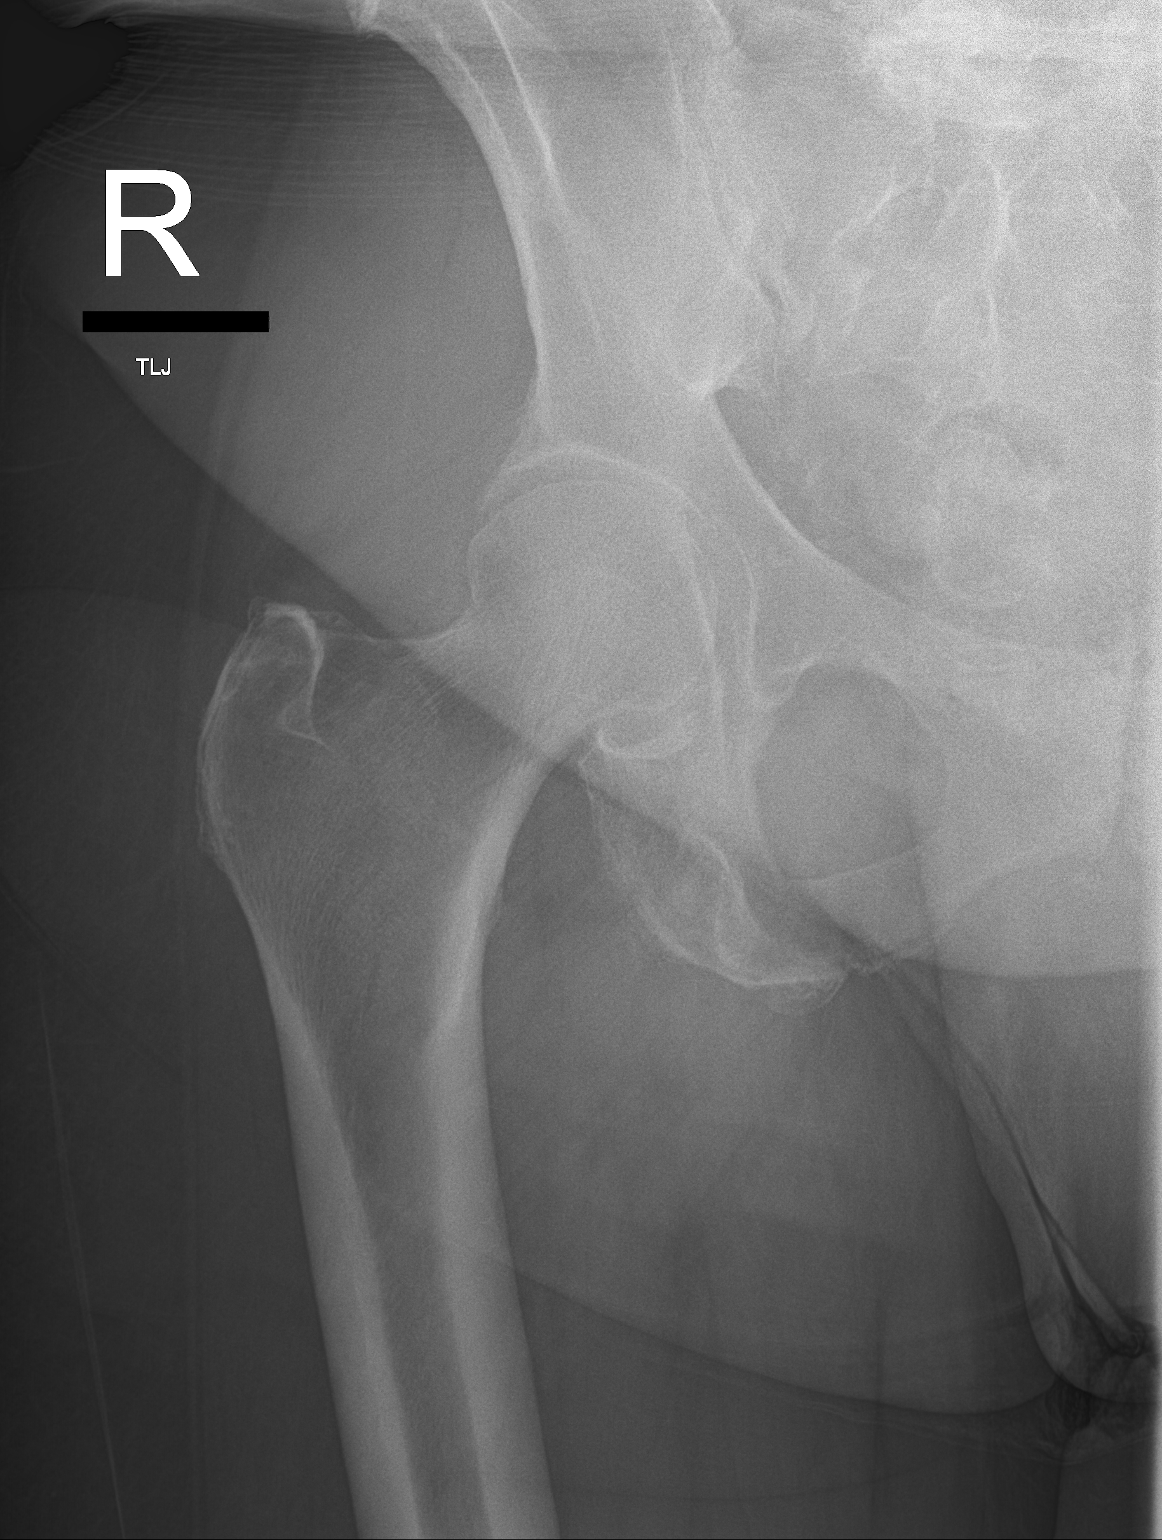

[hip lat]
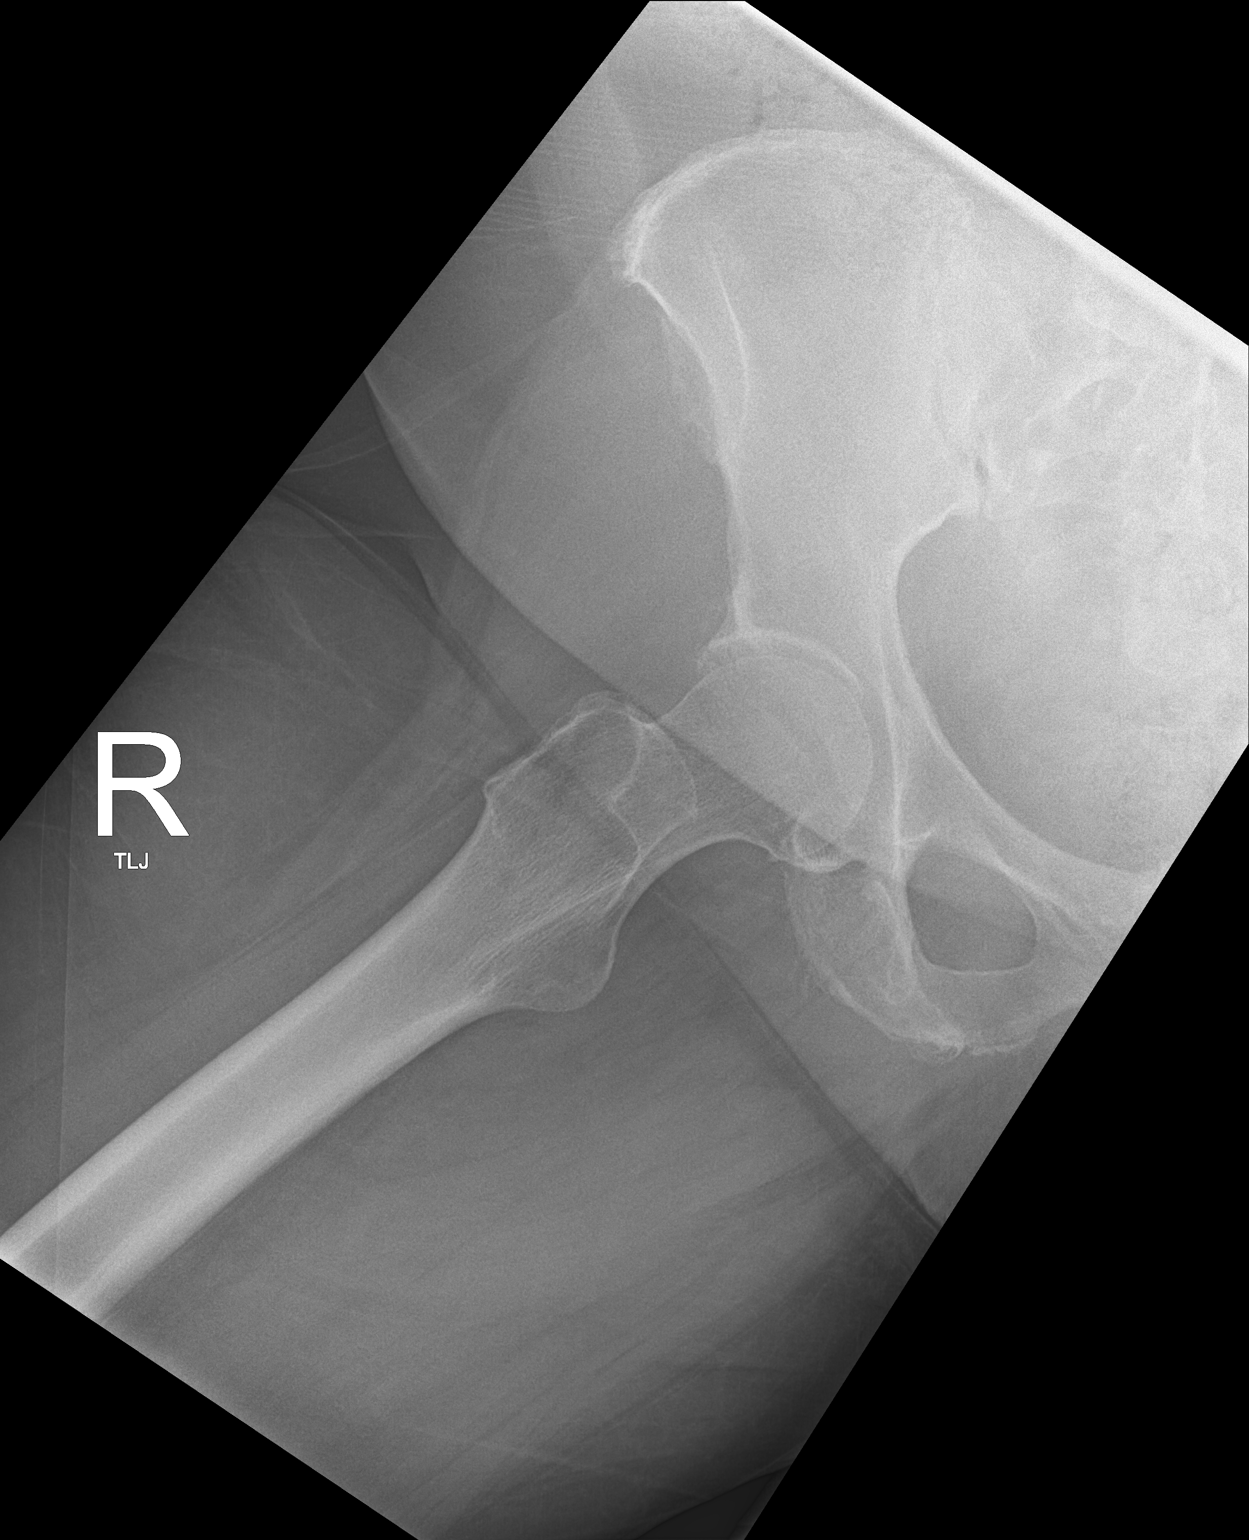

[3 of 3 positions shown; findings below may reference images not displayed]

FINDINGS: There is no evidence of hip fracture or dislocation. There is no
evidence of arthropathy or other focal bone abnormality.

Degenerative changes at L5-S1
IMPRESSION: Negative right hip

Degenerative change L5-S1.

## 2019-08-02 ENCOUNTER — Ambulatory Visit: Payer: Medicare Other | Admitting: Family Medicine

## 2019-09-03 LAB — HM DIABETES EYE EXAM

## 2019-09-05 ENCOUNTER — Ambulatory Visit: Payer: Medicare Other | Admitting: Podiatry

## 2019-09-09 ENCOUNTER — Inpatient Hospital Stay (HOSPITAL_BASED_OUTPATIENT_CLINIC_OR_DEPARTMENT_OTHER): Payer: Medicare Other | Admitting: Oncology

## 2019-09-09 ENCOUNTER — Encounter: Payer: Self-pay | Admitting: Oncology

## 2019-09-09 ENCOUNTER — Other Ambulatory Visit: Payer: Self-pay

## 2019-09-09 ENCOUNTER — Inpatient Hospital Stay: Payer: Medicare Other | Attending: Oncology

## 2019-09-09 VITALS — BP 149/88 | HR 86 | Resp 16 | Wt 236.7 lb

## 2019-09-09 DIAGNOSIS — Z79899 Other long term (current) drug therapy: Secondary | ICD-10-CM | POA: Insufficient documentation

## 2019-09-09 DIAGNOSIS — Z87891 Personal history of nicotine dependence: Secondary | ICD-10-CM | POA: Diagnosis not present

## 2019-09-09 DIAGNOSIS — E785 Hyperlipidemia, unspecified: Secondary | ICD-10-CM | POA: Diagnosis not present

## 2019-09-09 DIAGNOSIS — M25551 Pain in right hip: Secondary | ICD-10-CM | POA: Diagnosis not present

## 2019-09-09 DIAGNOSIS — C7A8 Other malignant neuroendocrine tumors: Secondary | ICD-10-CM | POA: Diagnosis not present

## 2019-09-09 DIAGNOSIS — I2510 Atherosclerotic heart disease of native coronary artery without angina pectoris: Secondary | ICD-10-CM | POA: Diagnosis not present

## 2019-09-09 DIAGNOSIS — Z8349 Family history of other endocrine, nutritional and metabolic diseases: Secondary | ICD-10-CM | POA: Insufficient documentation

## 2019-09-09 DIAGNOSIS — E119 Type 2 diabetes mellitus without complications: Secondary | ICD-10-CM | POA: Diagnosis not present

## 2019-09-09 DIAGNOSIS — Z833 Family history of diabetes mellitus: Secondary | ICD-10-CM | POA: Insufficient documentation

## 2019-09-09 DIAGNOSIS — Z803 Family history of malignant neoplasm of breast: Secondary | ICD-10-CM | POA: Insufficient documentation

## 2019-09-09 DIAGNOSIS — J449 Chronic obstructive pulmonary disease, unspecified: Secondary | ICD-10-CM | POA: Insufficient documentation

## 2019-09-09 DIAGNOSIS — Z8249 Family history of ischemic heart disease and other diseases of the circulatory system: Secondary | ICD-10-CM | POA: Diagnosis not present

## 2019-09-09 DIAGNOSIS — I10 Essential (primary) hypertension: Secondary | ICD-10-CM | POA: Diagnosis not present

## 2019-09-09 DIAGNOSIS — G8929 Other chronic pain: Secondary | ICD-10-CM | POA: Insufficient documentation

## 2019-09-09 LAB — COMPREHENSIVE METABOLIC PANEL
ALT: 24 U/L (ref 0–44)
AST: 26 U/L (ref 15–41)
Albumin: 4.2 g/dL (ref 3.5–5.0)
Alkaline Phosphatase: 63 U/L (ref 38–126)
Anion gap: 8 (ref 5–15)
BUN: 15 mg/dL (ref 8–23)
CO2: 29 mmol/L (ref 22–32)
Calcium: 9.7 mg/dL (ref 8.9–10.3)
Chloride: 99 mmol/L (ref 98–111)
Creatinine, Ser: 0.71 mg/dL (ref 0.44–1.00)
GFR calc Af Amer: >60 mL/min (ref >60)
GFR calc non Af Amer: >60 mL/min (ref >60)
Glucose, Bld: 130 mg/dL — ABNORMAL HIGH (ref 70–99)
Potassium: 3.7 mmol/L (ref 3.5–5.1)
Sodium: 136 mmol/L (ref 135–145)
Total Bilirubin: 0.6 mg/dL (ref 0.3–1.2)
Total Protein: 7.4 g/dL (ref 6.5–8.1)

## 2019-09-09 LAB — CBC WITH DIFFERENTIAL/PLATELET
Abs Immature Granulocytes: 0.01 10*3/uL (ref 0.00–0.07)
Basophils Absolute: 0.1 10*3/uL (ref 0.0–0.1)
Basophils Relative: 2 %
Eosinophils Absolute: 0.3 10*3/uL (ref 0.0–0.5)
Eosinophils Relative: 7 %
HCT: 44.9 % (ref 36.0–46.0)
Hemoglobin: 14.4 g/dL (ref 12.0–15.0)
Immature Granulocytes: 0 %
Lymphocytes Relative: 34 %
Lymphs Abs: 1.5 10*3/uL (ref 0.7–4.0)
MCH: 30.5 pg (ref 26.0–34.0)
MCHC: 32.1 g/dL (ref 30.0–36.0)
MCV: 95.1 fL (ref 80.0–100.0)
Monocytes Absolute: 0.8 10*3/uL (ref 0.1–1.0)
Monocytes Relative: 17 %
Neutro Abs: 1.9 10*3/uL (ref 1.7–7.7)
Neutrophils Relative %: 40 %
Platelets: 268 10*3/uL (ref 150–400)
RBC: 4.72 MIL/uL (ref 3.87–5.11)
RDW: 13.3 % (ref 11.5–15.5)
WBC: 4.6 10*3/uL (ref 4.0–10.5)
nRBC: 0 % (ref 0.0–0.2)

## 2019-09-09 NOTE — Progress Notes (Signed)
Patient here for follow up. No concerns voiced.  °

## 2019-09-09 NOTE — Progress Notes (Signed)
Hematology/Oncology follow up note [facility] [phone] [phone]   Patient Care Team: Burnard Hawthorne, FNP as PCP - General (Family Medicine) Jackolyn Confer, MD (Internal Medicine) Clent Jacks, RN as Registered Nurse  REFERRING PROVIDER: Gastroenterology: Napoleon Form. REASON FOR VISIT Follow up for management of neuroendocrine cancer of the colon and discussion about dotatate PET scan results.  HISTORY OF PRESENTING ILLNESS:  Cynthia [name] is a  73 y.o.  female with PMH listed below who was referred to me for evaluation of neuroendocrine cancer  She recently have screening colonoscopy done and was found to have 3 ascending colon polyps and 6 sigmoid colon polyps, all removed and retrieved. All three ascending colon polyps were tubular adenoma. 5 sigmoid colon polyps were hyperplastic polyps. One polyp turned out to be well defferentiated neuroendocrine tumor, 2.67mm, grade 1.   # 1 Year colonoscopy: S/p repeat colonoscopy on 08/10/2018. Polyps resected. Pathology negative malignancy.   INTERVAL HISTORY Cynthia [name] is a 73 y.o. female who has above history reviewed by me today presents for follow up of neuroendocrine cancer of colon.  Reports doing well.  She denies any diarrhea, facial flush, wheezing, abdominal pain, blood in the stool. Appetite is good. Chronic right hip pain. Patient has had chest CT lung cancer screening done this year.  Review of Systems  Constitutional: Negative for chills, fever, malaise/fatigue and weight loss.  HENT: Negative for sore throat.   Eyes: Negative for redness.  Respiratory: Negative for cough, shortness of breath and wheezing.   Cardiovascular: Negative for chest pain, palpitations and leg swelling.  Gastrointestinal: Negative for abdominal pain, blood in stool, nausea and vomiting.  Genitourinary: Negative for dysuria.  Musculoskeletal: Positive for joint pain. Negative for  myalgias.  Skin: Negative for rash.  Neurological: Negative for dizziness, tingling and tremors.  Endo/Heme/Allergies: Does not bruise/bleed easily.  Psychiatric/Behavioral: Negative for hallucinations.    MEDICAL HISTORY:  Past Medical History:  Diagnosis Date  . Arthritis   . 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   Marland Kitchen COPD (chronic obstructive pulmonary disease) ([place])   . Coronary artery disease   . Diabetes mellitus without complication ([place])   . Hyperlipidemia   . Hypertension   . Lung nodule   . Thyroid disease     SURGICAL HISTORY: Past Surgical History:  Procedure Laterality Date  . BREAST BIOPSY Left 11/25/2015   stereo  neg  . BREAST EXCISIONAL BIOPSY Left yrs ago   benign  . COLONOSCOPY WITH PROPOFOL N/A 04/20/2017   Procedure: COLONOSCOPY WITH PROPOFOL;  Surgeon: [name], MD;  Location: [facility] ENDOSCOPY;  Service: Endoscopy;  Laterality: N/A;  . COLONOSCOPY WITH PROPOFOL N/A 01/08/2018   Procedure: COLONOSCOPY WITH PROPOFOL;  Surgeon: [name], MD;  Location: [facility] ENDOSCOPY;  Service: Gastroenterology;  Laterality: N/A;  . COLONOSCOPY WITH PROPOFOL N/A 08/10/2018   Procedure: COLONOSCOPY WITH PROPOFOL;  Surgeon: [name], MD;  Location: [facility] ENDOSCOPY;  Service: Gastroenterology;  Laterality: N/A;  . THYROIDECTOMY     Dr. [name]  . VAGINAL DELIVERY     4    SOCIAL HISTORY: Social History   Socioeconomic History  . Marital status: Married    Spouse name: Not on file  . Number of children: 4  . Years of education: Not on file  . Highest education level: Not on file  Occupational History  . Not on file  Tobacco Use  . Smoking status: Former Smoker    Packs/day: 0.75  Years: 48.00    Pack years: 36.00    Types: Cigarettes    Quit date: 2015    Years since quitting: 5.9  . Smokeless tobacco: Former Systems developer    Types: Snuff, Chew  Substance and Sexual Activity  . Alcohol use: No  . Drug use: No  . Sexual activity: Never  Other Topics  Concern  . Not on file  Social History Narrative   Lives in [place] with husband. Has 4 children.      4 grandchildren.      Work - Retired from [employer]- walking the track, 4x per week      Diet- regular      Social Determinants of Radio broadcast assistant Strain:   . Difficulty of Paying Living Expenses: Not on file  Food Insecurity:   . Worried About Charity fundraiser in the Last Year: Not on file  . Ran Out of Food in the Last Year: Not on file  Transportation Needs:   . Lack of Transportation (Medical): Not on file  . Lack of Transportation (Non-Medical): Not on file  Physical Activity: Sufficiently Active  . Days of Exercise per Week: 5 days  . Minutes of Exercise per Session: 30 min  Stress: No Stress Concern Present  . Feeling of Stress : Not at all  Social Connections:   . Frequency of Communication with Friends and Family: Not on file  . Frequency of Social Gatherings with Friends and Family: Not on file  . Attends Religious Services: Not on file  . Active Member of Clubs or Organizations: Not on file  . Attends Archivist Meetings: Not on file  . Marital Status: Not on file  Intimate Partner Violence:   . Fear of Current or Ex-Partner: Not on file  . Emotionally Abused: Not on file  . Physically Abused: Not on file  . Sexually Abused: Not on file    FAMILY HISTORY: Family History  Problem Relation Age of Onset  . Hypertension Mother   . Hypertension Father   . Diabetes Sister   . Thyroid disease Sister   . Breast cancer Maternal Aunt   . Breast cancer Maternal Aunt     ALLERGIES:  has No Known Allergies.  MEDICATIONS:  Current Outpatient Medications  Medication Sig Dispense Refill  . amLODipine (NORVASC) 10 MG tablet TAKE 1 TABLET (10 MG TOTAL) BY MOUTH DAILY. 90 tablet 3  . aspirin 81 MG tablet Take 81 mg by mouth daily.    Marland Kitchen atorvastatin (LIPITOR) 20 MG tablet TAKE 1 TABLET (20 MG TOTAL) BY MOUTH DAILY. 90  tablet 0  . Calcium Carbonate-Vit D-Min (CALCIUM 600+D PLUS MINERALS) 600-400 MG-UNIT TABS Take by mouth.    . Calcium Carbonate-Vitamin D 600-400 MG-UNIT per tablet Take 1 tablet by mouth daily.    . diclofenac sodium (VOLTAREN) 1 % GEL Apply 2 g topically 4 (four) times daily.    . DULoxetine (CYMBALTA) 30 MG capsule Take one 30 mg tablet by mouth once a day for the first week. Then increase to two 30 mg tablets ( total 60mg ) by mouth once daily. 60 capsule 3  . glucose blood (ONE TOUCH ULTRA TEST) test strip TEST BLOOD SUGAR 1-2 TIMES A DAY 100 each 4  . Lancets (ONETOUCH ULTRASOFT) lancets Use as instructed 100 each 12  . levothyroxine (SYNTHROID) 137 MCG tablet Take 1 tablet (137 mcg total) by mouth daily before breakfast. 90 tablet  1  . losartan-hydrochlorothiazide (HYZAAR) 50-12.5 MG tablet Take 1 tablet by mouth daily. 90 tablet 1  . meloxicam (MOBIC) 7.5 MG tablet Take 1 tablet (7.5 mg total) by mouth daily as needed for pain. Take with food. 90 tablet 0  . metFORMIN (GLUCOPHAGE) 500 MG tablet Take one tablet by mouth two times a day. 180 tablet 1  . metoprolol tartrate (LOPRESSOR) 25 MG tablet Take 1 tablet (25 mg total) by mouth 2 (two) times daily. 90 tablet 1  . gabapentin (NEURONTIN) 100 MG capsule Take 1 capsule (100 mg total) by mouth 4 (four) times daily. (Patient not taking: Reported on 09/09/2019) 120 capsule 3   No current facility-administered medications for this visit.     PHYSICAL EXAMINATION: ECOG PERFORMANCE STATUS: 0 - Asymptomatic Vitals:   09/09/19 1028  BP: (!) 149/88  Pulse: 86  Resp: 16  SpO2: 100%   Filed Weights   09/09/19 1028  Weight: 236 lb 11.2 oz (107.4 kg)    Physical Exam Constitutional:      General: She is not in acute distress.    Appearance: She is well-developed.  HENT:     Head: Normocephalic and atraumatic.  Eyes:     General: No scleral icterus.    Conjunctiva/sclera: Conjunctivae normal.     Pupils: Pupils are equal, round,  and reactive to light.  Cardiovascular:     Rate and Rhythm: Normal rate and regular rhythm.     Heart sounds: Normal heart sounds.  Pulmonary:     Effort: Pulmonary effort is normal. No respiratory distress.     Breath sounds: Normal breath sounds. No wheezing.  Abdominal:     General: Bowel sounds are normal. There is no distension.     Palpations: Abdomen is soft. There is no mass.     Tenderness: There is no abdominal tenderness.  Musculoskeletal:        General: No deformity. Normal range of motion.     Cervical back: Normal range of motion and neck supple.  Lymphadenopathy:     Cervical: No cervical adenopathy.  Skin:    General: Skin is warm and dry.     Findings: No erythema or rash.  Neurological:     Mental Status: She is alert and oriented to person, place, and time.     Cranial Nerves: No cranial nerve deficit.     Coordination: Coordination normal.  Psychiatric:        Behavior: Behavior normal.        Thought Content: Thought content normal.      LABORATORY DATA:  I have reviewed the data as listed Lab Results  Component Value Date   WBC 4.6 09/09/2019   HGB 14.4 09/09/2019   HCT 44.9 09/09/2019   MCV 95.1 09/09/2019   PLT 268 09/09/2019   Recent Labs    10/12/18 0942 03/12/19 1322 09/09/19 1007  NA 140 139 136  K 4.6 3.8 3.7  CL 102 103 99  CO2 29 25 29   GLUCOSE 111* 115* 130*  BUN 13 16 15   CREATININE 0.72 0.76 0.71  CALCIUM 10.1 9.5 9.7  GFRNONAA  --  >60 >60  GFRAA  --  >60 >60  PROT  --  7.4 7.4  ALBUMIN  --  4.4 4.2  AST  --  27 26  ALT  --  24 24  ALKPHOS  --  56 63  BILITOT  --  0.7 0.6    Serum serotonin level at 111 within  normal range 5 HIAA urine test was obtained and patient has not gotten done yet.   ASSESSMENT & PLAN:  1. Neuroendocrine carcinoma of colon ([place])   2. Personal history of tobacco use, presenting hazards to health    #Repeat biochemical markers has been stable. chromogranin A has been followed today's  level is pending. 03/12/2019 41.6 ng/ ml 08/20/2018 1 nmol/L  5 HIAA quantitative- 24h urine will be collected today. 01/14/2019  3.8 02/15/2018   1.6  CT images as clinically indicated.  Former smoker, continue lung cancer screening program.   Follow-up in 12 months, 5 HIAA, chromogranin A, CBC and CMP. All questions were answered. The patient knows to call the clinic with any problems questions or concerns.  Return of visit: 12 months. Earlie Server, MD, PhD Hematology Oncology [facility] at [facility] Pager- [phone] 09/09/2019

## 2019-09-11 LAB — CHROMOGRANIN A REBASELINE
Chromogranin A (ng/mL): 43 ng/mL (ref 0.0–101.8)
Chromogranin A: 1 nmol/L (ref 0–5)

## 2019-09-16 ENCOUNTER — Ambulatory Visit: Payer: Medicare Other | Admitting: Podiatry

## 2019-09-16 ENCOUNTER — Other Ambulatory Visit: Payer: Self-pay

## 2019-09-16 ENCOUNTER — Encounter: Payer: Self-pay | Admitting: Podiatry

## 2019-09-16 DIAGNOSIS — M79675 Pain in left toe(s): Secondary | ICD-10-CM | POA: Diagnosis not present

## 2019-09-16 DIAGNOSIS — E1140 Type 2 diabetes mellitus with diabetic neuropathy, unspecified: Secondary | ICD-10-CM | POA: Diagnosis not present

## 2019-09-16 DIAGNOSIS — B351 Tinea unguium: Secondary | ICD-10-CM | POA: Diagnosis not present

## 2019-09-16 DIAGNOSIS — M79674 Pain in right toe(s): Secondary | ICD-10-CM | POA: Diagnosis not present

## 2019-09-16 NOTE — Progress Notes (Signed)
This patient presents to the office with chief complaint of long thick nails and diabetic feet.  This patient presents the office on the recommendation of her doctor for her long thick nails.   This patient says there are long thick painful nails.  These nails are painful walking and wearing shoes.  Patient has no history of infection or drainage from both feet.  Patient is unable to  self treat his own nails . This patient presents  to the office today for treatment of the  long nails and a foot evaluation due to history of  Diabetes.  Patient says she takes cymbalta which helps her neuropathy.  General Appearance  Alert, conversant and in no acute stress.  Vascular  Dorsalis pedis and posterior tibial  pulses are palpable  bilaterally.  Capillary return is within normal limits  bilaterally. Temperature is within normal limits  bilaterally.  Neurologic  Senn-Weinstein monofilament wire test within normal limits  bilaterally. Muscle power within normal limits bilaterally.  Nails Thick disfigured discolored nails with subungual debris  from hallux to fifth toes bilaterally. No evidence of bacterial infection or drainage bilaterally.  Orthopedic  No limitations of motion of motion feet .  No crepitus or effusions noted.  No bony pathology or digital deformities noted. Nild  HAV  B/L.  Skin  normotropic skin with no porokeratosis noted bilaterally.  No signs of infections or ulcers noted.     Onychomycosis  Diabetes with no foot neuropathy.  Debride nails  X 10.  RTC 3 months.   Gardiner Barefoot DPM

## 2019-09-17 ENCOUNTER — Telehealth: Payer: Self-pay

## 2019-09-17 DIAGNOSIS — C7A8 Other malignant neuroendocrine tumors: Secondary | ICD-10-CM

## 2019-09-17 NOTE — Telephone Encounter (Signed)
-----   Message from Earlie Server, MD sent at 09/16/2019 10:47 PM EST ----- Labs first, and see MD 1 weeks later. Thanks.

## 2019-09-24 ENCOUNTER — Other Ambulatory Visit: Payer: Self-pay

## 2019-09-24 DIAGNOSIS — C7A8 Other malignant neuroendocrine tumors: Secondary | ICD-10-CM

## 2019-09-29 LAB — 5 HIAA, QUANTITATIVE, URINE, 24 HOUR
5-HIAA, Ur: 2.5 mg/L
5-HIAA,Quant.,24 Hr Urine: 6 mg/24 hr (ref 0.0–14.9)
Total Volume: 2400

## 2019-10-01 ENCOUNTER — Other Ambulatory Visit: Payer: Self-pay | Admitting: Family

## 2019-10-01 DIAGNOSIS — M79671 Pain in right foot: Secondary | ICD-10-CM

## 2019-10-01 DIAGNOSIS — E1140 Type 2 diabetes mellitus with diabetic neuropathy, unspecified: Secondary | ICD-10-CM

## 2019-10-02 ENCOUNTER — Telehealth: Payer: Self-pay | Admitting: Family

## 2019-10-02 ENCOUNTER — Other Ambulatory Visit: Payer: Medicare Other

## 2019-10-02 ENCOUNTER — Other Ambulatory Visit: Payer: Self-pay

## 2019-10-02 DIAGNOSIS — E1140 Type 2 diabetes mellitus with diabetic neuropathy, unspecified: Secondary | ICD-10-CM

## 2019-10-02 DIAGNOSIS — M79671 Pain in right foot: Secondary | ICD-10-CM

## 2019-10-02 DIAGNOSIS — E785 Hyperlipidemia, unspecified: Secondary | ICD-10-CM

## 2019-10-02 MED ORDER — ATORVASTATIN CALCIUM 20 MG PO TABS: ORAL_TABLET | ORAL | 0 refills | Status: DC

## 2019-10-02 NOTE — Telephone Encounter (Signed)
Pt needs refills on DULoxetine (CYMBALTA) 30 MG capsule and atorvastatin (LIPITOR) 20 MG tablet [facility]

## 2019-10-02 NOTE — Telephone Encounter (Signed)
Both medications sent to pharmacy 

## 2019-10-09 ENCOUNTER — Ambulatory Visit: Payer: Medicare Other | Admitting: Oncology

## 2019-11-13 DIAGNOSIS — I10 Essential (primary) hypertension: Secondary | ICD-10-CM | POA: Diagnosis not present

## 2019-11-13 DIAGNOSIS — R000 Tachycardia, unspecified: Secondary | ICD-10-CM | POA: Diagnosis not present

## 2019-11-13 DIAGNOSIS — R0602 Shortness of breath: Secondary | ICD-10-CM | POA: Diagnosis not present

## 2019-11-13 DIAGNOSIS — I209 Angina pectoris, unspecified: Secondary | ICD-10-CM | POA: Diagnosis not present

## 2019-11-19 ENCOUNTER — Telehealth: Payer: Self-pay | Admitting: Family

## 2019-11-19 DIAGNOSIS — I10 Essential (primary) hypertension: Secondary | ICD-10-CM

## 2019-11-19 DIAGNOSIS — E785 Hyperlipidemia, unspecified: Secondary | ICD-10-CM

## 2019-11-19 DIAGNOSIS — E039 Hypothyroidism, unspecified: Secondary | ICD-10-CM

## 2019-11-19 MED ORDER — LEVOTHYROXINE SODIUM 137 MCG PO TABS: 137.0000 ug | ORAL_TABLET | Freq: Every day | ORAL | 1 refills | Status: DC

## 2019-11-19 MED ORDER — ATORVASTATIN CALCIUM 20 MG PO TABS: ORAL_TABLET | ORAL | 0 refills | Status: DC

## 2019-11-19 MED ORDER — LOSARTAN POTASSIUM-HCTZ 50-12.5 MG PO TABS: 1.0000 | ORAL_TABLET | Freq: Every day | ORAL | 1 refills | Status: DC

## 2019-11-19 MED ORDER — METFORMIN HCL 500 MG PO TABS: ORAL_TABLET | ORAL | 1 refills | Status: DC

## 2019-11-19 NOTE — Telephone Encounter (Signed)
Pt needs refill on metFORMIN (GLUCOPHAGE) 500 MG tablet and losartan-hydrochlorothiazide (HYZAAR) 50-12.5 MG tablet and levothyroxine (SYNTHROID) 137 MCG tablet and atorvastatin (LIPITOR) 20 MG tablet. Pt states that pharmacy wanted to call PCP for refills

## 2019-11-25 ENCOUNTER — Encounter: Payer: Self-pay | Admitting: Emergency Medicine

## 2019-11-25 ENCOUNTER — Emergency Department: Payer: Medicare Other

## 2019-11-25 ENCOUNTER — Other Ambulatory Visit: Payer: Self-pay

## 2019-11-25 ENCOUNTER — Emergency Department
Admission: EM | Admit: 2019-11-25 | Discharge: 2019-11-25 | Disposition: A | Discharge status: Home / Self Care | Payer: Medicare Other | Attending: Emergency Medicine | Admitting: Emergency Medicine

## 2019-11-25 DIAGNOSIS — J449 Chronic obstructive pulmonary disease, unspecified: Secondary | ICD-10-CM | POA: Diagnosis not present

## 2019-11-25 DIAGNOSIS — Z7984 Long term (current) use of oral hypoglycemic drugs: Secondary | ICD-10-CM | POA: Diagnosis not present

## 2019-11-25 DIAGNOSIS — Y939 Activity, unspecified: Secondary | ICD-10-CM | POA: Diagnosis not present

## 2019-11-25 DIAGNOSIS — S0990XA Unspecified injury of head, initial encounter: Secondary | ICD-10-CM | POA: Diagnosis not present

## 2019-11-25 DIAGNOSIS — I2510 Atherosclerotic heart disease of native coronary artery without angina pectoris: Secondary | ICD-10-CM | POA: Diagnosis not present

## 2019-11-25 DIAGNOSIS — S0083XA Contusion of other part of head, initial encounter: Secondary | ICD-10-CM | POA: Diagnosis not present

## 2019-11-25 DIAGNOSIS — S91111A Laceration without foreign body of right great toe without damage to nail, initial encounter: Secondary | ICD-10-CM | POA: Insufficient documentation

## 2019-11-25 DIAGNOSIS — E119 Type 2 diabetes mellitus without complications: Secondary | ICD-10-CM | POA: Diagnosis not present

## 2019-11-25 DIAGNOSIS — Z7982 Long term (current) use of aspirin: Secondary | ICD-10-CM | POA: Insufficient documentation

## 2019-11-25 DIAGNOSIS — Y999 Unspecified external cause status: Secondary | ICD-10-CM | POA: Insufficient documentation

## 2019-11-25 DIAGNOSIS — Z86010 Personal history of colonic polyps: Secondary | ICD-10-CM | POA: Insufficient documentation

## 2019-11-25 DIAGNOSIS — I10 Essential (primary) hypertension: Secondary | ICD-10-CM | POA: Diagnosis not present

## 2019-11-25 DIAGNOSIS — W06XXXA Fall from bed, initial encounter: Secondary | ICD-10-CM | POA: Diagnosis not present

## 2019-11-25 DIAGNOSIS — Y92009 Unspecified place in unspecified non-institutional (private) residence as the place of occurrence of the external cause: Secondary | ICD-10-CM | POA: Diagnosis not present

## 2019-11-25 DIAGNOSIS — Z87891 Personal history of nicotine dependence: Secondary | ICD-10-CM | POA: Diagnosis not present

## 2019-11-25 DIAGNOSIS — Z79899 Other long term (current) drug therapy: Secondary | ICD-10-CM | POA: Diagnosis not present

## 2019-11-25 IMAGING — CT CT HEAD W/O CM
3 series · 15 of 47 positions shown, 18 images · non-contrast
Comparison: None available

CLINICAL DATA: Fall with head injury

EXAM:
CT HEAD WITHOUT CONTRAST
TECHNIQUE: Contiguous axial images were obtained from the base of the skull
through the vertex without intravenous contrast.

[Series 2: head wo · axial · 0.47mm/px · z∈[-198,-63]mm · 9 of 33 slices shown, 12 images]
[im 3/33  brain]
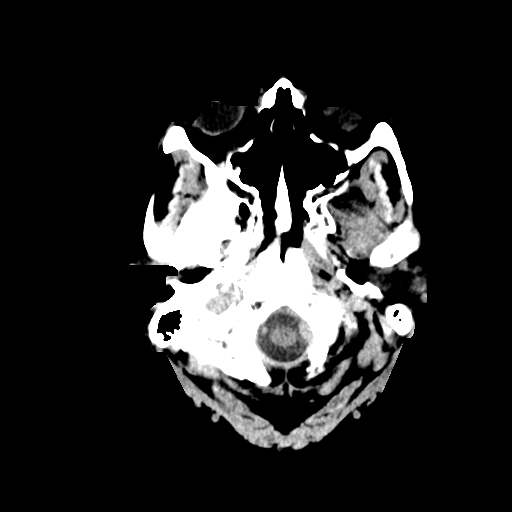
[im 3/33  bone]
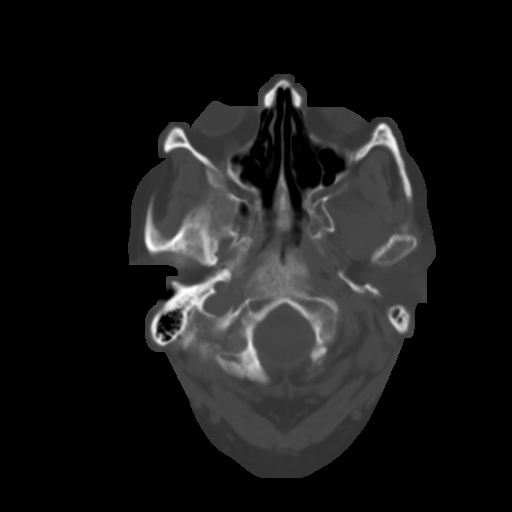
[im 6/33  brain]
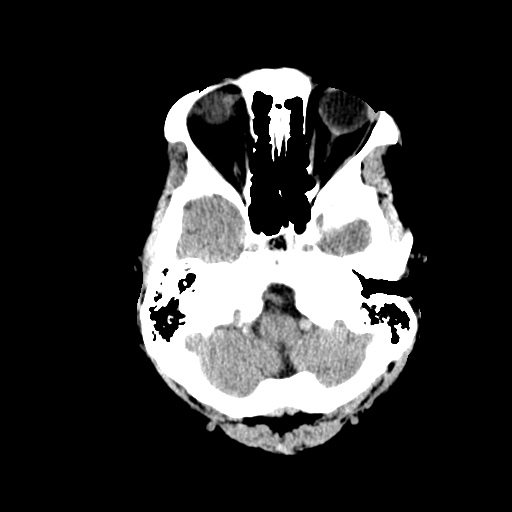
[im 9/33  brain]
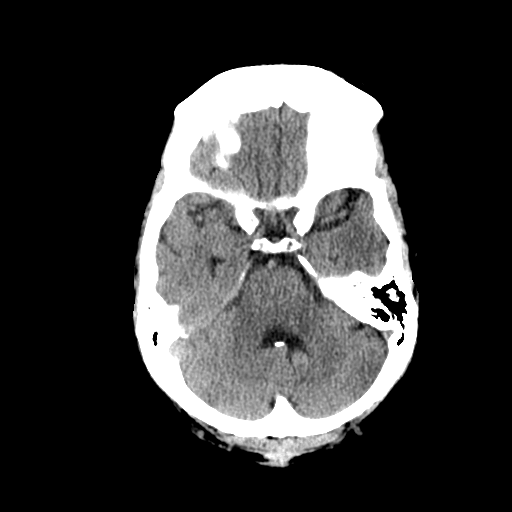
[im 13/33  brain]
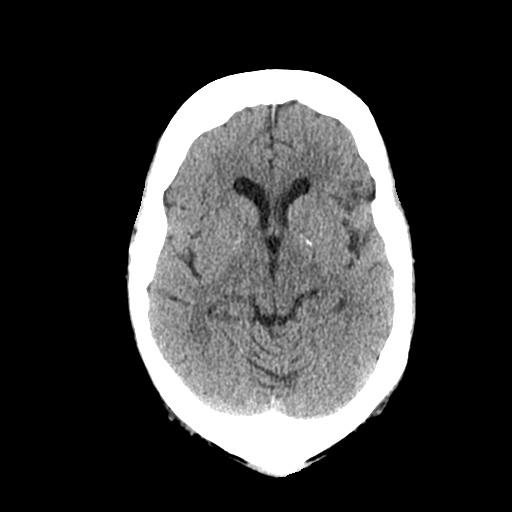
[im 17/33  brain]
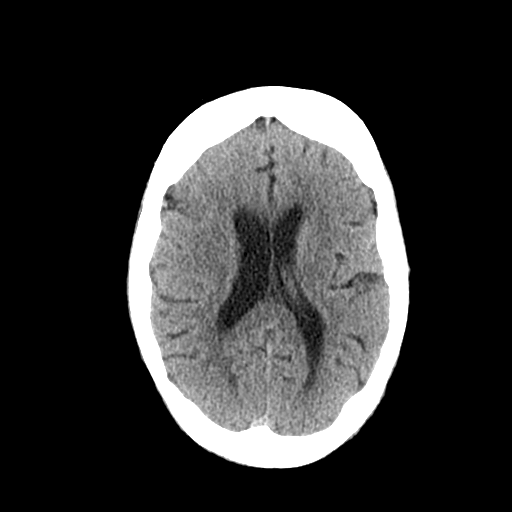
[im 17/33  bone]
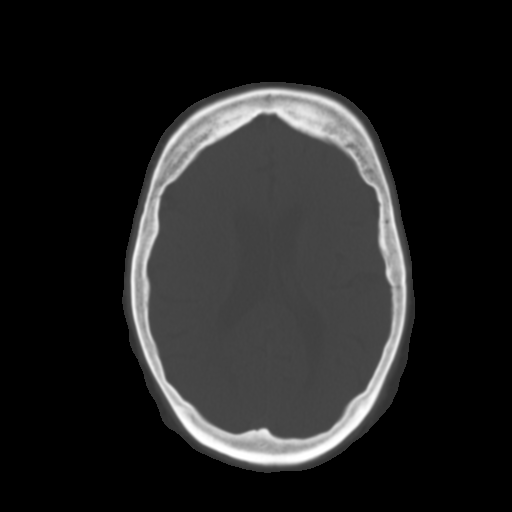
[im 20/33  brain]
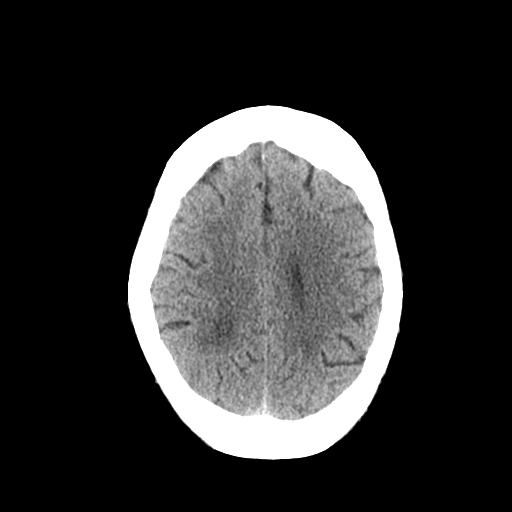
[im 24/33  brain]
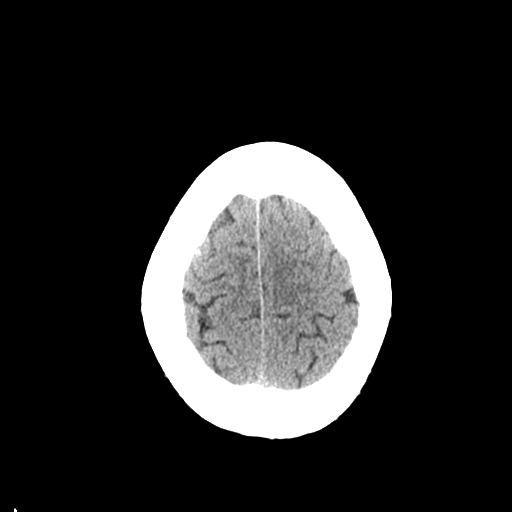
[im 27/33  brain]
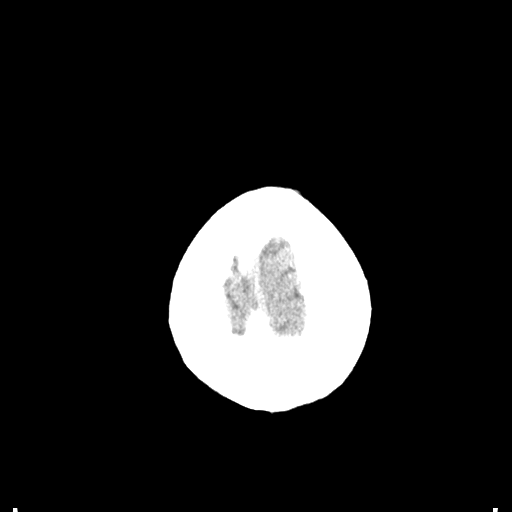
[im 30/33  brain]
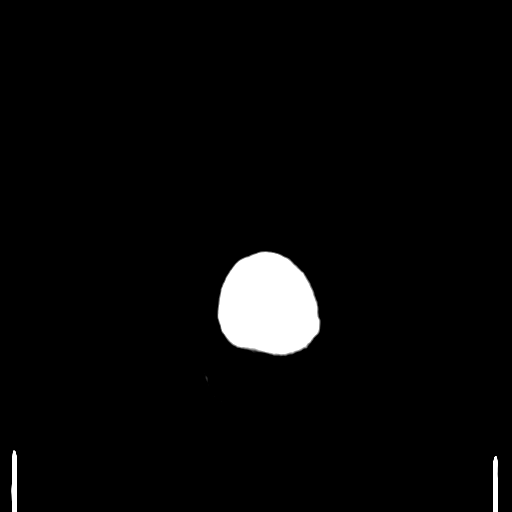
[im 30/33  bone]
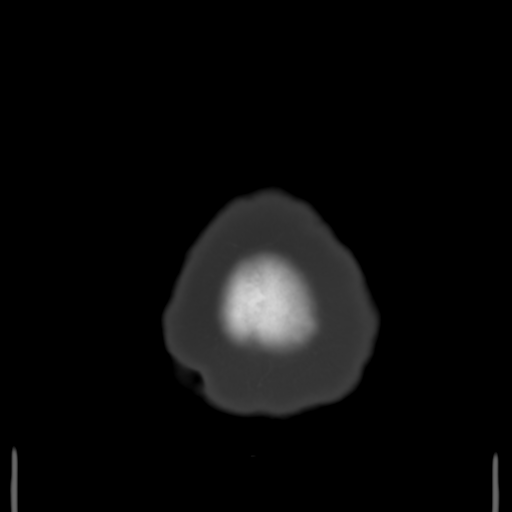

[Series 4: coronal soft tissue · coronal · 0.30mm/px · 3 of 70 slices shown]
[im 24/70  brain]
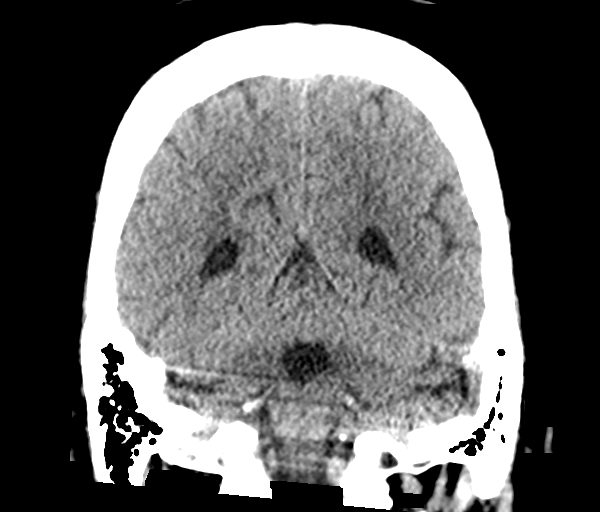
[im 31/70  brain]
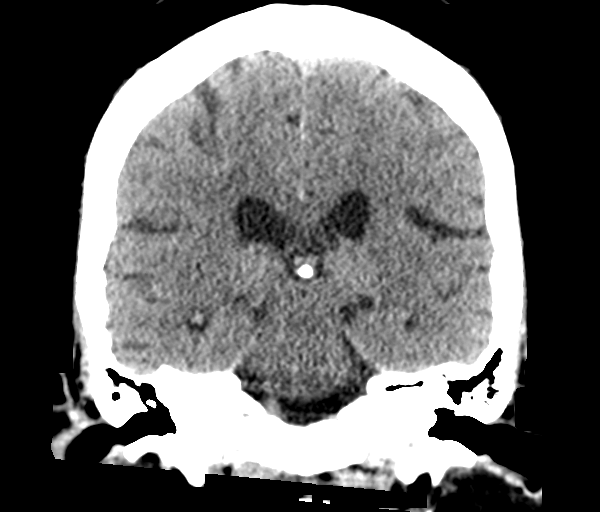
[im 39/70  brain]
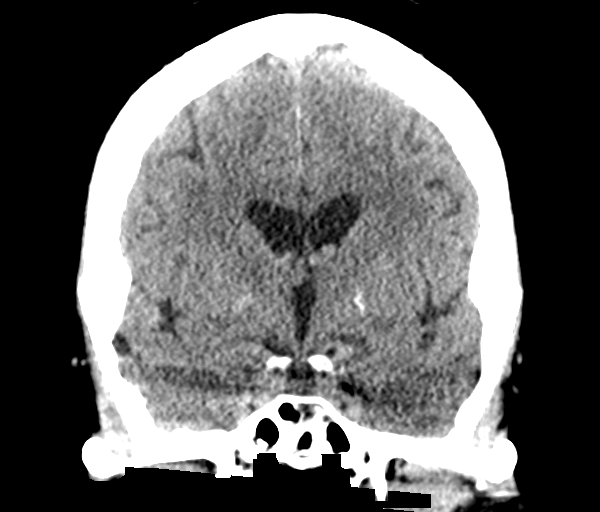

[Series 5: sagittal soft tissue · sagittal · 0.30mm/px · 3 of 59 slices shown]
[im 20/59  brain]
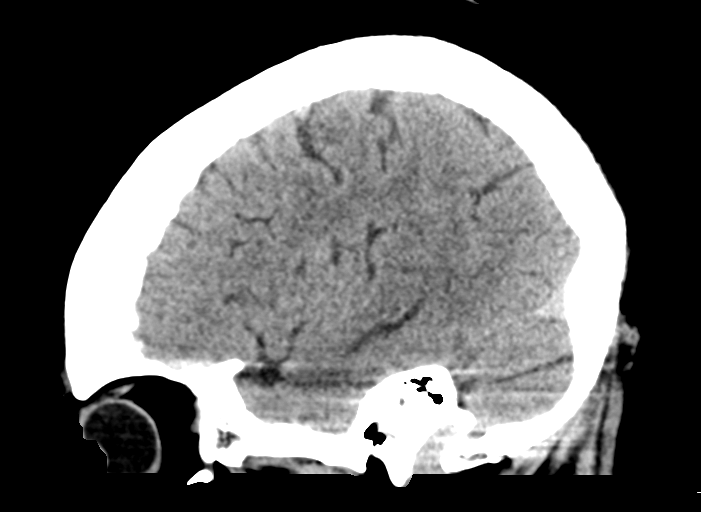
[im 30/59  brain]
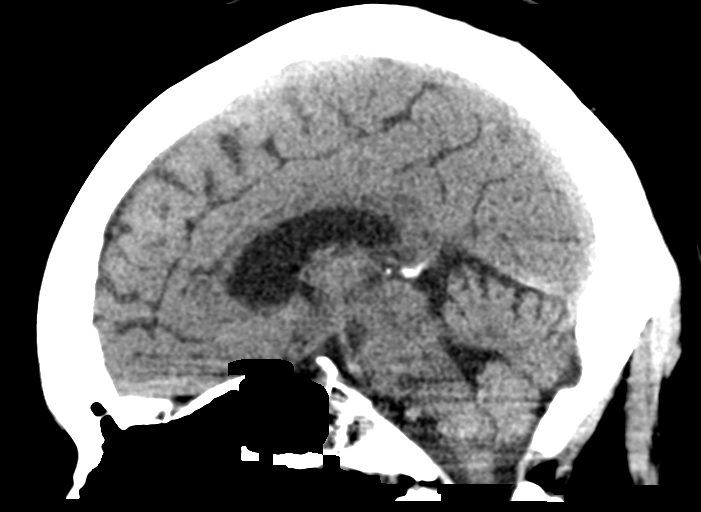
[im 39/59  brain]
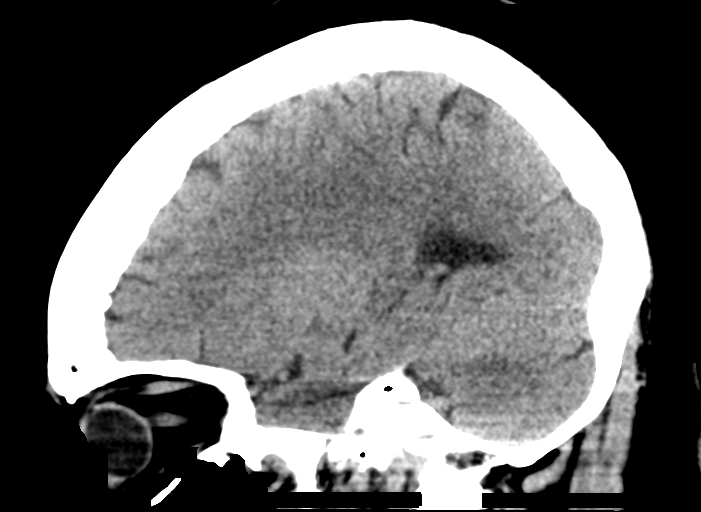

[15 of 47 positions shown; findings below may reference images not displayed]

FINDINGS: Brain: No evidence of acute infarction, hemorrhage, hydrocephalus,
extra-axial collection or mass lesion/mass effect. Mild chronic
small vessel ischemia in the cerebral white matter.

Vascular: Atherosclerotic calcification. Vertebrobasilar tortuosity.

Skull: Hyperostosis interna

Sinuses/Orbits: Negative
IMPRESSION: Unremarkable head CT for age.

## 2019-11-25 IMAGING — DX DG TOE GREAT 2+V*R*
3 series · 3 of 3 positions shown · non-contrast
Comparison: None.

CLINICAL DATA: Pain following fall with laceration medially

EXAM:
RIGHT FIRST TOE: 3 VIEWS

[toe ap]
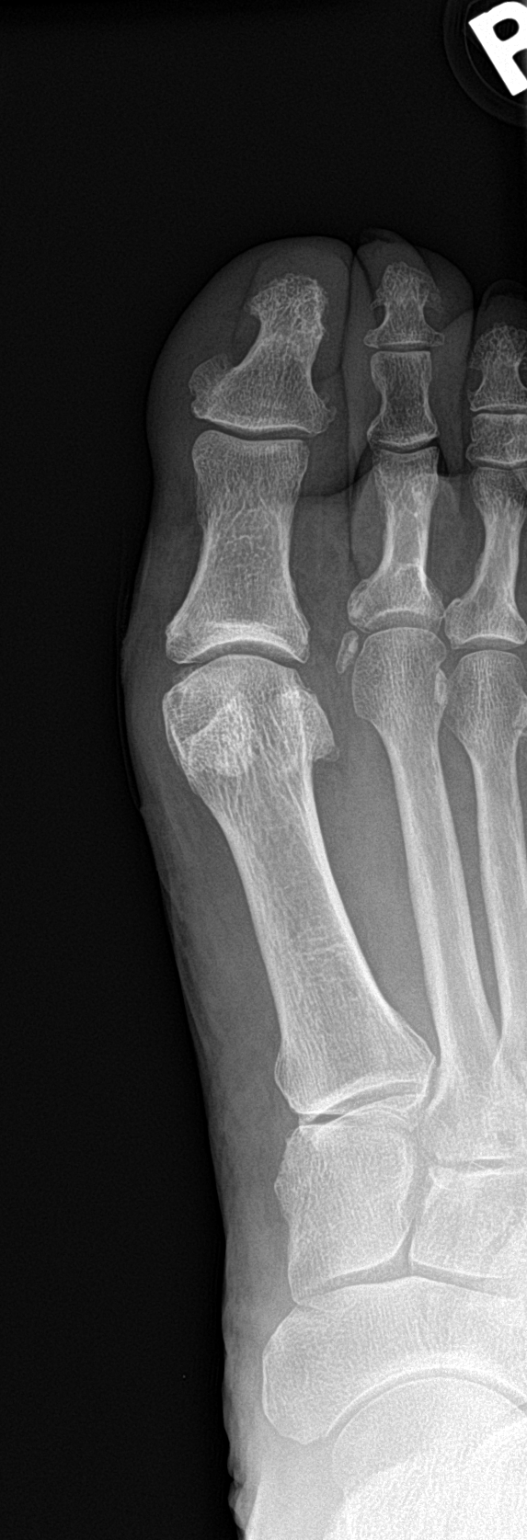

[toe obl]
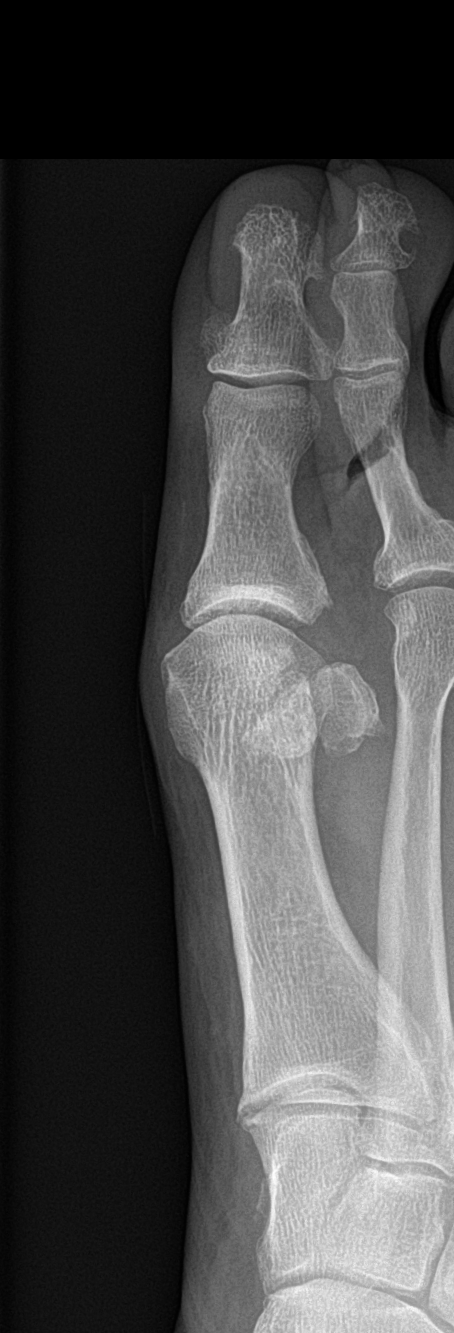

[toe lat]
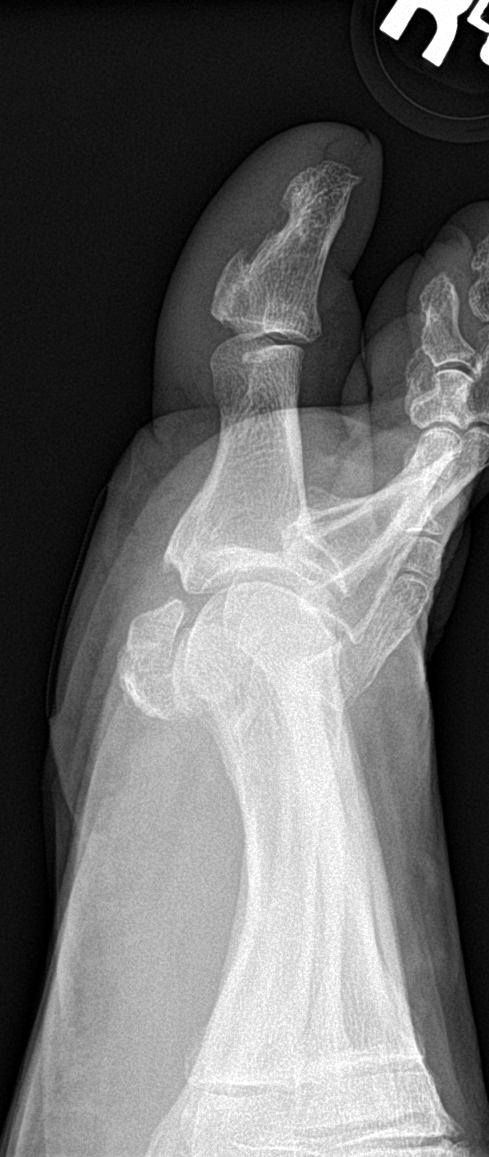

[3 of 3 positions shown; findings below may reference images not displayed]

FINDINGS: Frontal, oblique, and lateral views obtained. No fracture or
dislocation. No radiopaque foreign body or soft tissue air. There is
slight narrowing of the first MTP joint. Other joint spaces appear
normal. No erosive change.
IMPRESSION: No fracture or dislocation. No radiopaque foreign body. Slight
narrowing first MTP joint.

## 2019-11-25 NOTE — ED Provider Notes (Signed)
[facility] Emergency Department Provider Note   ____________________________________________   First MD Initiated Contact with Patient 11/25/19 [phone]     (approximate)  I have reviewed the triage vital signs and the nursing notes.   HISTORY  Chief Complaint Fall    HPI Cynthia [name] is a 74 y.o. female patient complain headache secondary to running out of bed in her head on the door.  Patient denies LOC.  Patient states continued headache.  Patient denies vertigo, weakness, or vision disturbance. Patient also has a laceration right great toe and previous controlled with direct pressure.         Past Medical History:  Diagnosis Date  . Arthritis   . 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   Marland Kitchen COPD (chronic obstructive pulmonary disease) ([place])   . Coronary artery disease   . Diabetes mellitus without complication ([place])   . Hyperlipidemia   . Hypertension   . Lung nodule   . Thyroid disease     Patient Active Problem List   Diagnosis Date Noted  . Pain due to onychomycosis of toenails of both feet 06/13/2019  . Low back pain 05/24/2019  . Sore gums 04/08/2019  . Atherosclerosis of aorta ([place]) 02/15/2019  . Former smoker 01/11/2019  . GERD (gastroesophageal reflux disease) 01/11/2019  . Chronic pain of both knees 10/12/2018  . COPD exacerbation ([place]) 03/13/2018  . Mouth sore 02/05/2018  . 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 Acute pain of left foot 07/30/2017  . Screening for breast cancer 02/15/2017  . Screen for colon cancer 02/15/2017  . Routine general medical examination at a health care facility 10/21/2015  . Medicare annual wellness visit, subsequent 03/08/2013  . Abnormal CT scan, chest 11/28/2012  . Hypertension 10/18/2012  . Arthritis 10/18/2012  . Type 2 diabetes mellitus with diabetic neuropathy, unspecified ([place]) 10/18/2012  . Hypothyroidism 10/18/2012  . Hyperlipidemia 10/18/2012    Past  Surgical History:  Procedure Laterality Date  . BREAST BIOPSY Left 11/25/2015   stereo  neg  . BREAST EXCISIONAL BIOPSY Left yrs ago   benign  . COLONOSCOPY WITH PROPOFOL N/A 04/20/2017   Procedure: COLONOSCOPY WITH PROPOFOL;  Surgeon: [name], MD;  Location: [facility] ENDOSCOPY;  Service: Endoscopy;  Laterality: N/A;  . COLONOSCOPY WITH PROPOFOL N/A 01/08/2018   Procedure: COLONOSCOPY WITH PROPOFOL;  Surgeon: [name], MD;  Location: [facility] ENDOSCOPY;  Service: Gastroenterology;  Laterality: N/A;  . COLONOSCOPY WITH PROPOFOL N/A 08/10/2018   Procedure: COLONOSCOPY WITH PROPOFOL;  Surgeon: [name], MD;  Location: [facility] ENDOSCOPY;  Service: Gastroenterology;  Laterality: N/A;  . THYROIDECTOMY     Dr. [name]  . VAGINAL DELIVERY     4    Prior to Admission medications   Medication Sig Start Date End Date Taking? Authorizing Provider  amLODipine (NORVASC) 10 MG tablet TAKE 1 TABLET (10 MG TOTAL) BY MOUTH DAILY. 04/17/19   Burnard Hawthorne, FNP  aspirin 81 MG tablet Take 81 mg by mouth daily.    [provider]  atorvastatin (LIPITOR) 20 MG tablet TAKE 1 TABLET (20 MG TOTAL) BY MOUTH DAILY. 11/19/19   Burnard Hawthorne, FNP  Calcium Carbonate-Vit D-Min (CALCIUM 600+D PLUS MINERALS) 600-400 MG-UNIT TABS Take by mouth.    [provider]  Calcium Carbonate-Vitamin D 600-400 MG-UNIT per tablet Take 1 tablet by mouth daily.    [provider]  diclofenac sodium (VOLTAREN) 1 % GEL Apply 2 g topically 4 (four)  times daily.    [provider]  DULoxetine (CYMBALTA) 30 MG capsule TAKE 1 CAPSULE BY MOUTH ONCE A DAY FOR FIRST WEEK, THEN INCREASE TO 2 CAPS BY MOUTH ONCE DAILY 10/02/19   Burnard Hawthorne, FNP  gabapentin (NEURONTIN) 100 MG capsule Take 1 capsule (100 mg total) by mouth 4 (four) times daily. 07/05/19   Jodelle Green, FNP  glucose blood (ONE TOUCH ULTRA TEST) test strip TEST BLOOD SUGAR 1-2 TIMES A DAY 04/17/19   Burnard Hawthorne, FNP  Lancets  [facility] ULTRASOFT) lancets Use as instructed 01/11/19   Burnard Hawthorne, FNP  levothyroxine (SYNTHROID) 137 MCG tablet Take 1 tablet (137 mcg total) by mouth daily before breakfast. 11/19/19   Burnard Hawthorne, FNP  losartan-hydrochlorothiazide (HYZAAR) 50-12.5 MG tablet Take 1 tablet by mouth daily. 11/19/19   Burnard Hawthorne, FNP  meloxicam (MOBIC) 7.5 MG tablet Take 1 tablet (7.5 mg total) by mouth daily as needed for pain. Take with food. 10/12/18   Burnard Hawthorne, FNP  metFORMIN (GLUCOPHAGE) 500 MG tablet Take one tablet by mouth two times a day. 11/19/19   Burnard Hawthorne, FNP  metoprolol tartrate (LOPRESSOR) 25 MG tablet Take 1 tablet (25 mg total) by mouth 2 (two) times daily. 04/17/19   Burnard Hawthorne, FNP    Allergies Patient has no known allergies.  Family History  Problem Relation Age of Onset  . Hypertension Mother   . Hypertension Father   . Diabetes Sister   . Thyroid disease Sister   . Breast cancer Maternal Aunt   . Breast cancer Maternal Aunt     Social History Social History   Tobacco Use  . Smoking status: Former Smoker    Packs/day: 0.75    Years: 48.00    Pack years: 36.00    Types: Cigarettes    Quit date: 2015    Years since quitting: 6.1  . Smokeless tobacco: Former Systems developer    Types: Snuff, Chew  Substance Use Topics  . Alcohol use: No  . Drug use: No    Review of Systems  Constitutional: No fever/chills Eyes: No visual changes. ENT: No sore throat. Cardiovascular: Denies chest pain. Respiratory: Denies shortness of breath. Gastrointestinal: No abdominal pain.  No nausea, no vomiting.  No diarrhea.  No constipation. Genitourinary: Negative for dysuria. Musculoskeletal: Negative for back pain. Skin: Negative for rash. Neurological: Positive for headaches, but denies focal weakness or numbness. Endocrine:  Diabetes, hyperlipidemia, hypertension, and hypothyroidism.. ____________________________________________   PHYSICAL  EXAM:  VITAL SIGNS: ED Triage Vitals  Enc Vitals Group     BP 11/25/19 0928 (!) 131/104     Pulse Rate 11/25/19 0928 (!) 109     Resp 11/25/19 0928 16     Temp 11/25/19 0928 98.8 F (37.1 C)     Temp Source 11/25/19 0928 Oral     SpO2 11/25/19 0928 97 %     Weight 11/25/19 0924 240 lb (108.9 kg)     Height 11/25/19 0924 5\' 6"  (1.676 m)     Head Circumference --      Peak Flow --      Pain Score 11/25/19 0924 0     Pain Loc --      Pain Edu? --      Excl. in [place]? --     Constitutional: Alert and oriented. Well appearing and in no acute distress. Eyes: Conjunctivae are normal. PERRL. EOMI. Head: Atraumatic. Nose: No congestion/rhinnorhea. Mouth/Throat: Mucous membranes are  moist.  Oropharynx non-erythematous. Neck: No cervical spine tenderness to palpation. Cardiovascular: Normal rate, regular rhythm. Grossly normal heart sounds.  Good peripheral circulation. Respiratory: Normal respiratory effort.  No retractions. Lungs CTAB. Musculoskeletal: No lower extremity tenderness nor edema.  No joint effusions. Neurologic:  Normal speech and language. No gross focal neurologic deficits are appreciated. No gait instability. Skin: Laceration medial aspect of right great toe. No rash noted. Psychiatric: Mood and affect are normal. Speech and behavior are normal.  ____________________________________________   LABS (all labs ordered are listed, but only abnormal results are displayed)  Labs Reviewed - No data to display ____________________________________________  EKG   ____________________________________________  RADIOLOGY  ED MD interpretation:    Official radiology report(s): CT Head Wo Contrast  Result Date: 11/25/2019 CLINICAL DATA:  Fall with head injury EXAM: CT HEAD WITHOUT CONTRAST TECHNIQUE: Contiguous axial images were obtained from the base of the skull through the vertex without intravenous contrast. COMPARISON:  None available FINDINGS: Brain: No evidence of  acute infarction, hemorrhage, hydrocephalus, extra-axial collection or mass lesion/mass effect. Mild chronic small vessel ischemia in the cerebral white matter. Vascular: Atherosclerotic calcification. Vertebrobasilar tortuosity. Skull: Hyperostosis interna Sinuses/Orbits: Negative IMPRESSION: Unremarkable head CT for age. Electronically Signed   By: Monte Fantasia M.D.   On: 11/25/2019 10:08   DG Toe Great Right  Result Date: 11/25/2019 CLINICAL DATA:  Pain following fall with laceration medially EXAM: RIGHT FIRST TOE: 3 VIEWS COMPARISON:  None. FINDINGS: Frontal, oblique, and lateral views obtained. No fracture or dislocation. No radiopaque foreign body or soft tissue air. There is slight narrowing of the first MTP joint. Other joint spaces appear normal. No erosive change. IMPRESSION: No fracture or dislocation. No radiopaque foreign body. Slight narrowing first MTP joint. Electronically Signed   By: Lowella Grip III M.D.   On: 11/25/2019 10:22    ____________________________________________   PROCEDURES  Procedure(s) performed (including Critical Care):  Marland KitchenMarland KitchenLaceration Repair  Date/Time: 11/25/2019 11:04 AM Performed by: Sable Feil, PA-C Authorized by: Sable Feil, PA-C   Consent:    Consent obtained:  Verbal   Consent given by:  Patient   Risks discussed:  Infection, pain and poor cosmetic result Anesthesia (see MAR for exact dosages):    Anesthesia method:  None Laceration details:    Location:  Toe   Toe location:  R big toe   Length (cm):  1.5   Depth (mm):  1 Repair type:    Repair type:  Simple Pre-procedure details:    Preparation:  Patient was prepped and draped in usual sterile fashion Treatment:    Area cleansed with:  Betadine and saline   Amount of cleaning:  Standard Skin repair:    Repair method:  Tissue adhesive Approximation:    Approximation:  Close Post-procedure details:    Dressing:  Adhesive bandage   Patient tolerance of procedure:   Tolerated well, no immediate complications     ____________________________________________   INITIAL IMPRESSION / ASSESSMENT AND PLAN / ED COURSE  As part of my medical decision making, I reviewed the following data within the [place]      Patient presents with 5 pain and laceration to the right great toe secondary to a fall as she rolled out of bed.  Patient has LOC.  Discussed CT findings with patient.  See procedure note for wound closure of the right great toe.  Patient given discharge care instructions and advised follow-up PCP as needed.    Milas Gain  Darcey was evaluated in Emergency Department on 11/25/2019 for the symptoms described in the history of present illness. She was evaluated in the context of the global COVID-19 pandemic, which necessitated consideration that the patient might be at risk for infection with the SARS-CoV-2 virus that causes COVID-19. Institutional protocols and algorithms that pertain to the evaluation of patients at risk for COVID-19 are in a state of rapid change based on information released by regulatory bodies including the CDC and federal and state organizations. These policies and algorithms were followed during the patient's care in the ED.       ____________________________________________   FINAL CLINICAL IMPRESSION(S) / ED DIAGNOSES  Final diagnoses:  Contusion of forehead, initial encounter  Laceration of right great toe without foreign body present, nail damage status unspecified, initial encounter     ED Discharge Orders    None       Note:  This document was prepared using Dragon voice recognition software and may include unintentional dictation errors.    Sable Feil, PA-C 11/25/19 1105    Lavonia Drafts, MD 11/25/19 [phone]

## 2019-11-25 NOTE — Discharge Instructions (Signed)
Your discharge care instructions [place] wound care.  No creams on applied to area.  Advised Tylenol for pain.

## 2019-11-25 NOTE — ED Triage Notes (Signed)
Pt in via POV, reports rolling out of bed this morning, hitting her head on the door.  Denies LOC, denies blood thinners.  Ambulatory to triage, NAD noted at this time.

## 2019-11-25 NOTE — ED Notes (Signed)
See triage note  Presents s/p fall  States she rolled out of bed  Hit her head on door and also caught her foot  Small laceration noted to right foot

## 2019-12-06 ENCOUNTER — Encounter: Payer: Self-pay | Admitting: Family

## 2019-12-06 ENCOUNTER — Other Ambulatory Visit: Payer: Self-pay

## 2019-12-06 ENCOUNTER — Ambulatory Visit (INDEPENDENT_AMBULATORY_CARE_PROVIDER_SITE_OTHER): Payer: Medicare Other | Admitting: Family

## 2019-12-06 VITALS — BP 130/85 | HR 84 | Temp 95.9°F | Ht 66.0 in | Wt 245.4 lb

## 2019-12-06 DIAGNOSIS — I10 Essential (primary) hypertension: Secondary | ICD-10-CM | POA: Diagnosis not present

## 2019-12-06 DIAGNOSIS — S91111D Laceration without foreign body of right great toe without damage to nail, subsequent encounter: Secondary | ICD-10-CM

## 2019-12-06 DIAGNOSIS — S91113A Laceration without foreign body of unspecified great toe without damage to nail, initial encounter: Secondary | ICD-10-CM | POA: Insufficient documentation

## 2019-12-06 DIAGNOSIS — E1140 Type 2 diabetes mellitus with diabetic neuropathy, unspecified: Secondary | ICD-10-CM

## 2019-12-06 DIAGNOSIS — Z780 Asymptomatic menopausal state: Secondary | ICD-10-CM

## 2019-12-06 LAB — COMPREHENSIVE METABOLIC PANEL
ALT: 24 U/L (ref 0–35)
AST: 25 U/L (ref 0–37)
Albumin: 4.1 g/dL (ref 3.5–5.2)
Alkaline Phosphatase: 60 U/L (ref 39–117)
BUN: 12 mg/dL (ref 6–23)
CO2: 31 mEq/L (ref 19–32)
Calcium: 9.7 mg/dL (ref 8.4–10.5)
Chloride: 100 mEq/L (ref 96–112)
Creatinine, Ser: 0.7 mg/dL (ref 0.40–1.20)
GFR: 98.93 mL/min (ref >60.00)
Glucose, Bld: 94 mg/dL (ref 70–99)
Potassium: 3.8 mEq/L (ref 3.5–5.1)
Sodium: 136 mEq/L (ref 135–145)
Total Bilirubin: 0.6 mg/dL (ref 0.2–1.2)
Total Protein: 7.1 g/dL (ref 6.0–8.3)

## 2019-12-06 LAB — HEMOGLOBIN A1C: Hgb A1c MFr Bld: 6.7 % — ABNORMAL HIGH (ref 4.6–6.5)

## 2019-12-06 NOTE — Assessment & Plan Note (Signed)
Stable, continue amlodipine, losartan, metoprolol, hctz.

## 2019-12-06 NOTE — Assessment & Plan Note (Signed)
Healed

## 2019-12-06 NOTE — Assessment & Plan Note (Signed)
Stable, pending A1c.

## 2019-12-06 NOTE — Patient Instructions (Addendum)
DO not have mammogram within a month of covid vaccine.   Please call call and schedule your 3D mammogram, bone density scan as discussed.   [place]  [place], [place]

## 2019-12-06 NOTE — Progress Notes (Signed)
Subjective:    Patient ID: Cynthia [name], female    DOB: 1946/08/01, 74 y.o.   MRN: [id number]  CC: Cynthia [name] is a 74 y.o. female who presents today for follow up.   HPI: Feels well today, no complaints. She is here today for me to look at her right toe since her laceration.  States it has healed nicely. No purulent discharge, redness or swelling.  She has been wearing a Band-Aid.  HTN- compliant with medicaton. No cp, sob  Neuropathy- controlled cymbalta.   DM- compliant with Metformin   No falls  Other than 11/25/19.  Describes that she got out of bed and when she got back into bed,  she was too close to the edge and fell off.  She had no associated chest pain, diaphoresis, headache or vision changes shortness of breath at that time.She describes the event as clumsy.     Seen in emergency room 11/25/19 for contusion of the forehead after she was running out of bed and she hit her head on the door.  No loss of consciousness.  Had a laceration on her right great toe. CT head and great toe right were negative  HISTORY:  Past Medical History:  Diagnosis Date  . Arthritis   . 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   Marland Kitchen COPD (chronic obstructive pulmonary disease) ([place])   . Coronary artery disease   . Diabetes mellitus without complication ([place])   . Hyperlipidemia   . Hypertension   . Lung nodule   . Thyroid disease    Past Surgical History:  Procedure Laterality Date  . BREAST BIOPSY Left 11/25/2015   stereo  neg  . BREAST EXCISIONAL BIOPSY Left yrs ago   benign  . COLONOSCOPY WITH PROPOFOL N/A 04/20/2017   Procedure: COLONOSCOPY WITH PROPOFOL;  Surgeon: [name], MD;  Location: [facility] ENDOSCOPY;  Service: Endoscopy;  Laterality: N/A;  . COLONOSCOPY WITH PROPOFOL N/A 01/08/2018   Procedure: COLONOSCOPY WITH PROPOFOL;  Surgeon: [name], MD;  Location: [facility] ENDOSCOPY;  Service: Gastroenterology;  Laterality: N/A;  . COLONOSCOPY WITH PROPOFOL N/A 08/10/2018   Procedure: COLONOSCOPY WITH PROPOFOL;  Surgeon: [name], MD;  [facility] END[facility]COPY;  Service: Gastroenterology;  Laterality: N/A;  . THYROIDECTOMY     Dr. [name]  . VAGINAL DELIVERY     4   Family History  Problem Relation Age of Onset  . Hypertension Mother   . Hypertension Father   . Diabetes Sister   . Thyroid disease Sister   . Breast cancer Maternal Aunt   . Breast cancer Maternal Aunt     Allergies: Patient has no known allergies. Current Outpatient Medications on File Prior to Visit  Medication Sig Dispense Refill  . amLODipine (NORVASC) 10 MG tablet TAKE 1 TABLET (10 MG TOTAL) BY MOUTH DAILY. 90 tablet 3  . aspirin 81 MG tablet Take 81 mg by mouth daily.    Marland Kitchen atorvastatin (LIPITOR) 20 MG tablet TAKE 1 TABLET (20 MG TOTAL) BY MOUTH DAILY. 90 tablet 0  . Calcium Carbonate-Vit D-Min (CALCIUM 600+D PLUS MINERALS) 600-400 MG-UNIT TABS Take by mouth.    . diclofenac sodium (VOLTAREN) 1 % GEL Apply 2 g topically 4 (four) times daily.    . DULoxetine (CYMBALTA) 30 MG capsule TAKE 1 CAPSULE BY MOUTH ONCE A DAY FOR FIRST WEEK, THEN INCREASE TO 2 CAPS BY MOUTH ONCE DAILY 60 capsule 3  . glucose blood (ONE TOUCH ULTRA TEST) test strip TEST  BLOOD SUGAR 1-2 TIMES A DAY 100 each 4  . Lancets (ONETOUCH ULTRASOFT) lancets Use as instructed 100 each 12  . levothyroxine (SYNTHROID) 137 MCG tablet Take 1 tablet (137 mcg total) by mouth daily before breakfast. 90 tablet 1  . losartan-hydrochlorothiazide (HYZAAR) 50-12.5 MG tablet Take 1 tablet by mouth daily. 90 tablet 1  . meloxicam (MOBIC) 7.5 MG tablet Take 1 tablet (7.5 mg total) by mouth daily as needed for pain. Take with food. 90 tablet 0  . metFORMIN (GLUCOPHAGE) 500 MG tablet Take one tablet by mouth two times a day. 180 tablet 1  . metoprolol tartrate (LOPRESSOR) 25 MG tablet Take 1 tablet (25 mg total) by mouth 2 (two) times daily. 90 tablet 1   No current facility-administered medications on file prior to visit.    Social  History   Tobacco Use  . Smoking status: Former Smoker    Packs/day: 0.75    Years: 48.00    Pack years: 36.00    Types: Cigarettes    Quit date: 2015    Years since quitting: 6.2  . Smokeless tobacco: Former Systems developer    Types: Snuff, Chew  Substance Use Topics  . Alcohol use: No  . Drug use: No    Review of Systems  Constitutional: Negative for chills and fever.  Respiratory: Negative for cough.   Cardiovascular: Negative for chest pain and palpitations.  Gastrointestinal: Negative for nausea and vomiting.  Skin: Positive for wound. Negative for rash.      Objective:    BP 130/85   Pulse 84   Temp (!) 95.9 F (35.5 C) (Temporal)   Ht 5\' 6"  (1.676 m)   Wt 245 lb 6.4 oz (111.3 kg)   SpO2 99%   BMI 39.61 kg/m  BP Readings from Last 3 Encounters:  12/06/19 130/85  11/25/19 (!) 134/92  09/09/19 (!) 149/88   Wt Readings from Last 3 Encounters:  12/06/19 245 lb 6.4 oz (111.3 kg)  11/25/19 240 lb (108.9 kg)  09/09/19 236 lb 11.2 oz (107.4 kg)    Physical Exam Vitals reviewed.  Constitutional:      Appearance: She is well-developed.  Eyes:     Conjunctiva/sclera: Conjunctivae normal.  Cardiovascular:     Rate and Rhythm: Normal rate and regular rhythm.     Pulses: Normal pulses.     Heart sounds: Normal heart sounds.     Comments: Right palpable pedal pulse. Pulmonary:     Effort: Pulmonary effort is normal.     Breath sounds: Normal breath sounds. No wheezing, rhonchi or rales.  Musculoskeletal:     Right lower leg: No edema.     Left lower leg: No edema.  Skin:    General: Skin is warm and dry.     Comments: Healed 2-3 cm linear scar noted right medial great toe.  No purulent discharge, edema.  Neurological:     Mental Status: She is alert.  Psychiatric:        Speech: Speech normal.        Behavior: Behavior normal.        Thought Content: Thought content normal.        Assessment & Plan:   Problem List Items Addressed This Visit       Cardiovascular and Mediastinum   Hypertension    Stable, continue amlodipine, losartan, metoprolol, hctz.      Relevant Orders   Comprehensive metabolic panel     Endocrine   Type 2 diabetes mellitus  with diabetic neuropathy, unspecified ([place]) - Primary    Stable, pending A1c.      Relevant Orders   Hemoglobin A1c     Other   Laceration of great toe    Healed.       Other Visit Diagnoses    Asymptomatic menopausal state       Relevant Orders   MM 3D SCREEN BREAST BILATERAL   DG Bone Density     Of note, patient will schedule mammogram and bone density  I have discontinued [place] A. Scripter's gabapentin. I am also having her maintain her aspirin, Calcium 600+D Plus Minerals, meloxicam, onetouch ultrasoft, diclofenac sodium, metoprolol tartrate, glucose blood, amLODipine, DULoxetine, metFORMIN, losartan-hydrochlorothiazide, atorvastatin, and levothyroxine.   No orders of the defined types were placed in this encounter.   Return precautions given.   Risks, benefits, and alternatives of the medications and treatment plan prescribed today were discussed, and patient expressed understanding.   Education regarding symptom management and diagnosis given to patient on AVS.  Continue to follow with Burnard Hawthorne, FNP for routine health maintenance.   Cynthia [name] and I agreed with plan.   Mable Paris, FNP

## 2019-12-16 ENCOUNTER — Ambulatory Visit: Payer: Medicare Other | Admitting: Podiatry

## 2019-12-20 ENCOUNTER — Ambulatory Visit: Payer: Medicare Other | Attending: Internal Medicine

## 2019-12-20 DIAGNOSIS — Z23 Encounter for immunization: Secondary | ICD-10-CM

## 2019-12-20 NOTE — Progress Notes (Signed)
   Covid-19 Vaccination Clinic  Name:  Cynthia [name]    MRN: [id number] DOB: 1946-02-25  12/20/2019  [name] was observed post Covid-19 immunization for 15 minutes without incident. She was provided with Vaccine Information Sheet and instruction to access the V-Safe system.   [name] was instructed to call 911 with any severe reactions post vaccine: Marland Kitchen Difficulty breathing  . Swelling of face and throat  . A fast heartbeat  . A bad rash all over body  . Dizziness and weakness   Immunizations Administered    Name Date Dose VIS Date Route   Pfizer COVID-19 Vaccine 12/20/2019 10:45 AM 0.3 mL 08/30/2019 Intramuscular   Manufacturer: [place]   Lot: [id number]   [place]: [id number]

## 2019-12-26 ENCOUNTER — Encounter: Payer: Self-pay | Admitting: Podiatry

## 2019-12-26 ENCOUNTER — Other Ambulatory Visit: Payer: Self-pay

## 2019-12-26 ENCOUNTER — Ambulatory Visit: Payer: Medicare Other | Admitting: Podiatry

## 2019-12-26 VITALS — Temp 97.1°F

## 2019-12-26 DIAGNOSIS — B351 Tinea unguium: Secondary | ICD-10-CM | POA: Diagnosis not present

## 2019-12-26 DIAGNOSIS — M79675 Pain in left toe(s): Secondary | ICD-10-CM | POA: Diagnosis not present

## 2019-12-26 DIAGNOSIS — E1140 Type 2 diabetes mellitus with diabetic neuropathy, unspecified: Secondary | ICD-10-CM | POA: Diagnosis not present

## 2019-12-26 DIAGNOSIS — M79674 Pain in right toe(s): Secondary | ICD-10-CM

## 2019-12-26 NOTE — Progress Notes (Signed)

## 2020-01-14 ENCOUNTER — Ambulatory Visit: Payer: Medicare Other | Attending: Internal Medicine

## 2020-01-14 DIAGNOSIS — Z23 Encounter for immunization: Secondary | ICD-10-CM

## 2020-01-14 NOTE — Progress Notes (Signed)
   Covid-19 Vaccination Clinic  Name:  JILLANN CHARETTE    MRN: [id number] DOB: Oct 05, 1945  01/14/2020  Ms. Avera was observed post Covid-19 immunization for 15 minutes without incident. She was provided with Vaccine Information Sheet and instruction to access the V-Safe system.   Ms. Pricer was instructed to call 911 with any severe reactions post vaccine: Marland Kitchen Difficulty breathing  . Swelling of face and throat  . A fast heartbeat  . A bad rash all over body  . Dizziness and weakness   Immunizations Administered    Name Date Dose VIS Date Route   Pfizer COVID-19 Vaccine 01/14/2020 11:27 AM 0.3 mL 11/13/2018 Intramuscular   Manufacturer: [place]   Lot: [id number]   [place]: [id number]

## 2020-01-21 ENCOUNTER — Encounter: Payer: Self-pay | Admitting: Family

## 2020-01-23 ENCOUNTER — Telehealth: Payer: Self-pay

## 2020-01-23 DIAGNOSIS — Z122 Encounter for screening for malignant neoplasm of respiratory organs: Secondary | ICD-10-CM

## 2020-01-23 DIAGNOSIS — Z87891 Personal history of nicotine dependence: Secondary | ICD-10-CM

## 2020-01-23 NOTE — Telephone Encounter (Signed)
I contacted patient to schedule her lung screening CT scan. Her last lung screening CT was 02/13/2019. Patient is willing to have another scan scheduled.  Her second COVID vaccine was 01/14/2020.  We scheduled her scan for June 2 at 11:15.  She states her insurance has not changed since last year.

## 2020-01-27 ENCOUNTER — Telehealth: Payer: Self-pay | Admitting: Family

## 2020-01-27 DIAGNOSIS — M79671 Pain in right foot: Secondary | ICD-10-CM

## 2020-01-27 DIAGNOSIS — M79672 Pain in left foot: Secondary | ICD-10-CM

## 2020-01-27 DIAGNOSIS — E1140 Type 2 diabetes mellitus with diabetic neuropathy, unspecified: Secondary | ICD-10-CM

## 2020-01-27 MED ORDER — DULOXETINE HCL 30 MG PO CPEP: ORAL_CAPSULE | ORAL | 3 refills | Status: DC

## 2020-01-27 NOTE — Telephone Encounter (Signed)
Pt needs a refill on DULoxetine (CYMBALTA) 30 MG capsule sent to [facility]

## 2020-01-28 NOTE — Telephone Encounter (Signed)
Smoking history: former, quit 2015, 36 pack year

## 2020-01-28 NOTE — Addendum Note (Signed)
Addended by: Lieutenant Diego on: 01/28/2020 01:41 PM   Modules accepted: Orders

## 2020-02-19 ENCOUNTER — Other Ambulatory Visit: Payer: Self-pay

## 2020-02-19 ENCOUNTER — Ambulatory Visit
Admission: RE | Admit: 2020-02-19 | Discharge: 2020-02-19 | Disposition: A | Discharge status: Home / Self Care | Payer: Medicare Other | Source: Ambulatory Visit | Attending: Oncology | Admitting: Oncology

## 2020-02-19 DIAGNOSIS — Z122 Encounter for screening for malignant neoplasm of respiratory organs: Secondary | ICD-10-CM | POA: Diagnosis not present

## 2020-02-19 DIAGNOSIS — Z87891 Personal history of nicotine dependence: Secondary | ICD-10-CM | POA: Insufficient documentation

## 2020-02-19 IMAGING — CT CT CHEST LUNG CANCER SCREENING LOW DOSE W/O CM
2 of 4 series · 15 of 40 positions shown, 18 images · non-contrast
Comparison: [DATE].

CLINICAL DATA: Former smoker, quit 6 years ago, 36 pack-year
history.

EXAM:
CT CHEST WITHOUT CONTRAST LOW-DOSE FOR LUNG CANCER SCREENING
TECHNIQUE: Multidetector CT imaging of the chest was performed following the
standard protocol without IV contrast.

[Series 3: lung 1.00 · axial · 0.70mm/px · z∈[-1176,-890]mm · 12 of 326 slices shown, 15 images]
[im 27/326  mediastinal]
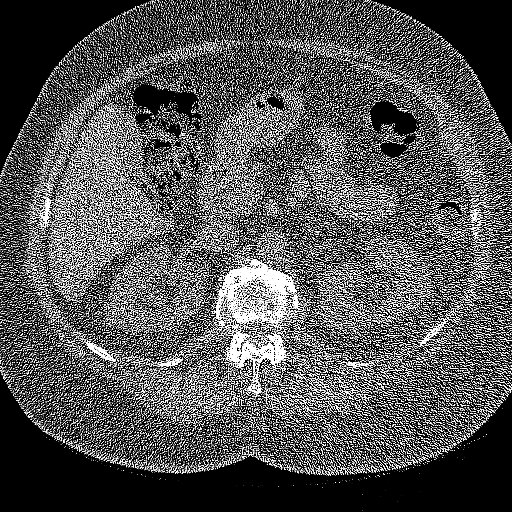
[im 27/326  lung]
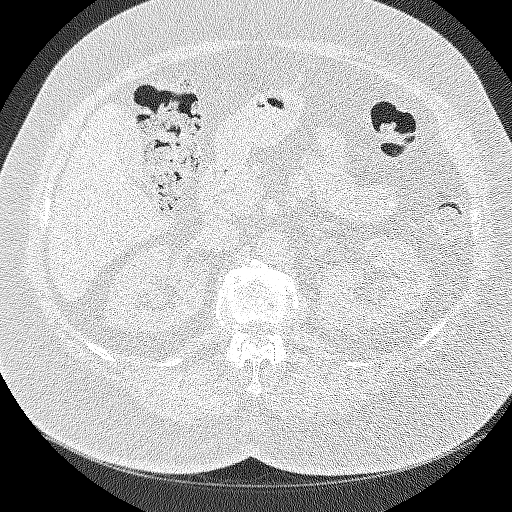
[im 53/326  lung]
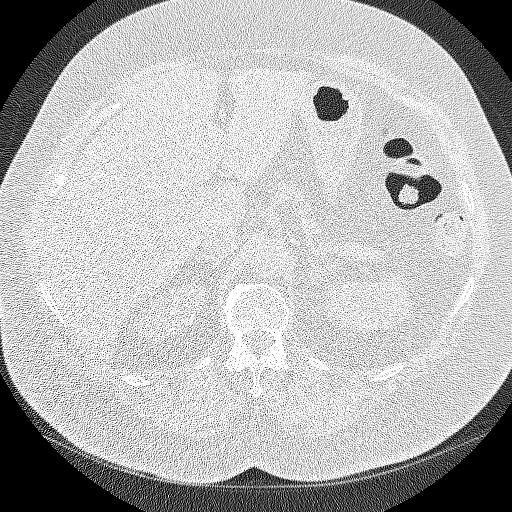
[im 79/326  lung]
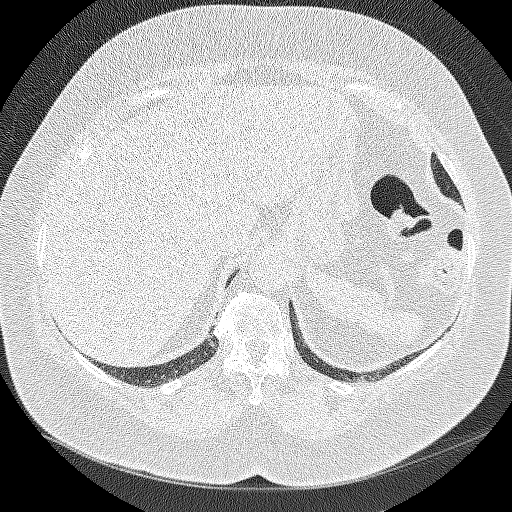
[im 105/326  lung]
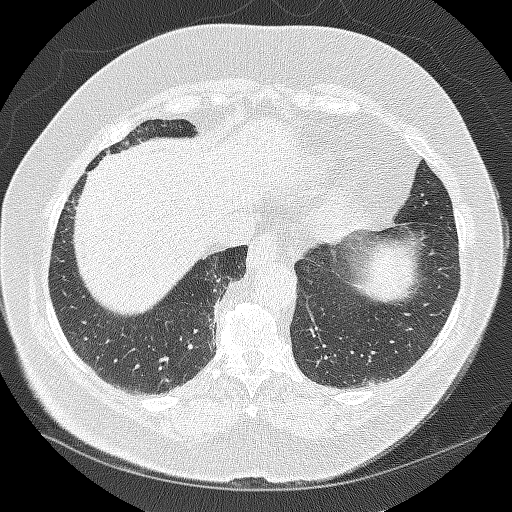
[im 131/326  mediastinal]
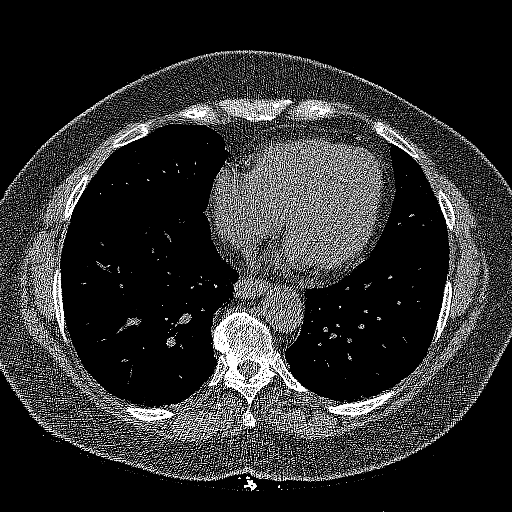
[im 131/326  lung]
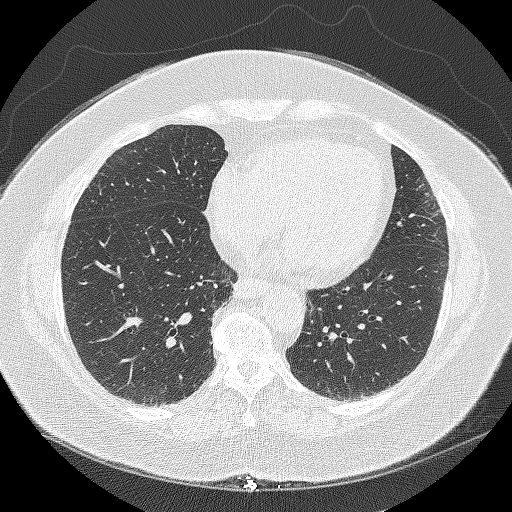
[im 157/326  lung]
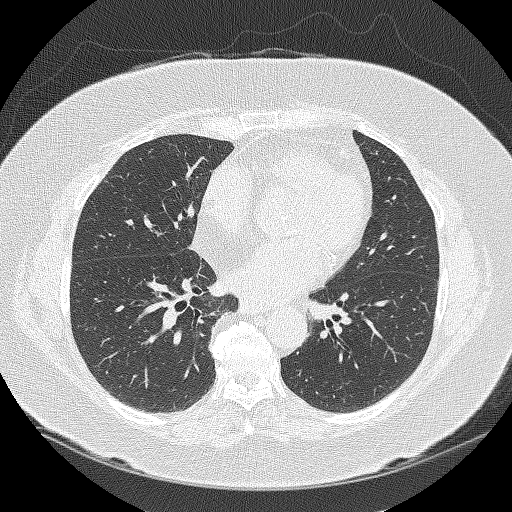
[im 183/326  lung]
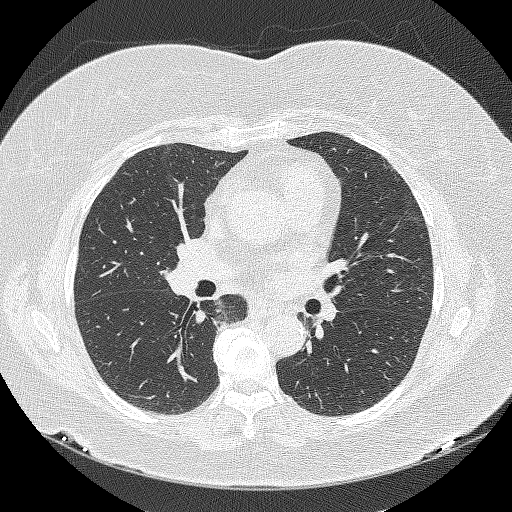
[im 209/326  lung]
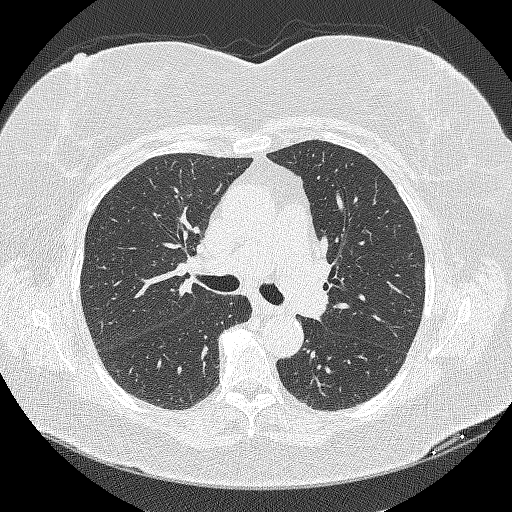
[im 235/326  mediastinal]
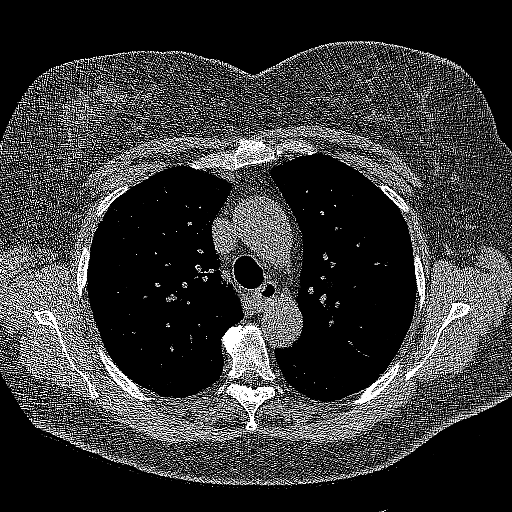
[im 235/326  lung]
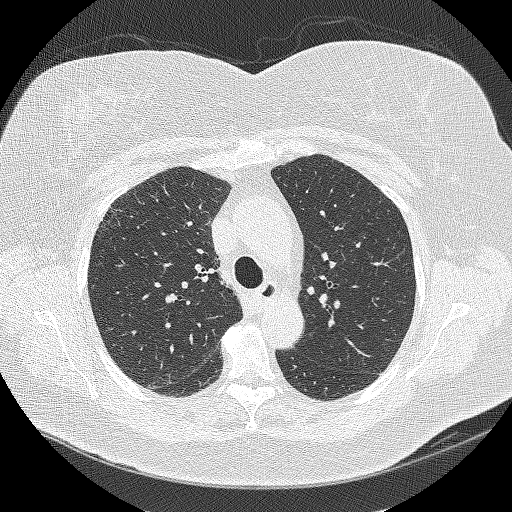
[im 261/326  lung]
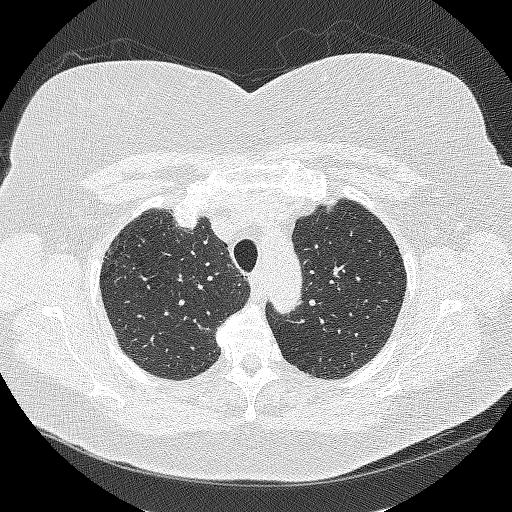
[im 287/326  lung]
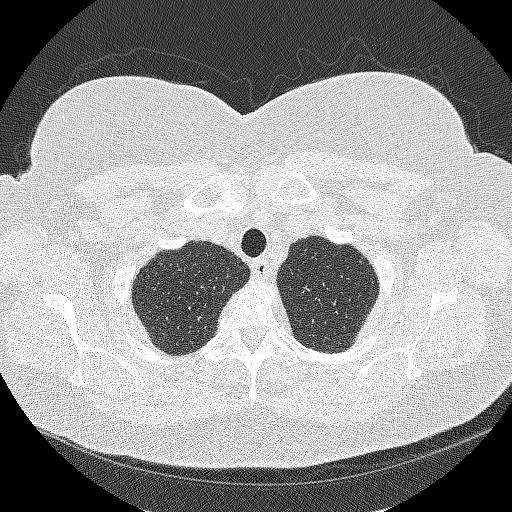
[im 313/326  lung]
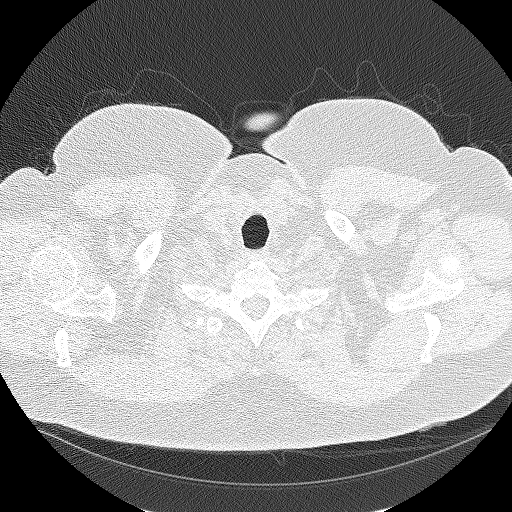

[Series 4: coronals lung 1.00 cor · coronal · 0.64mm/px · 3 of 331 slices shown]
[im 67/331  lung]
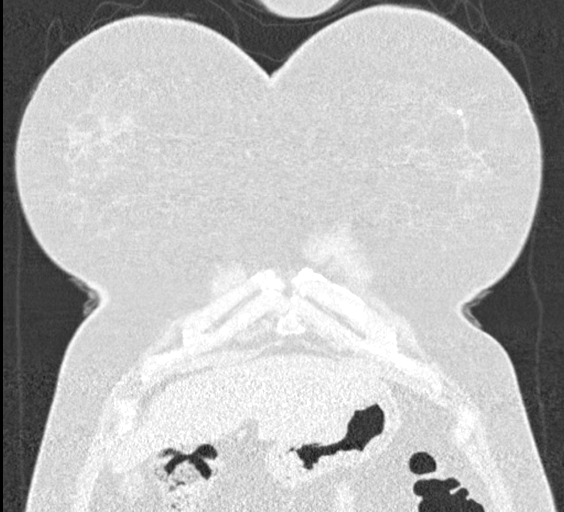
[im 133/331  lung]
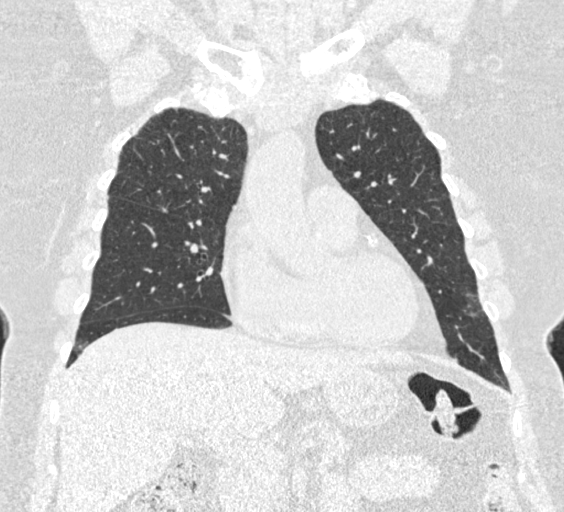
[im 199/331  lung]
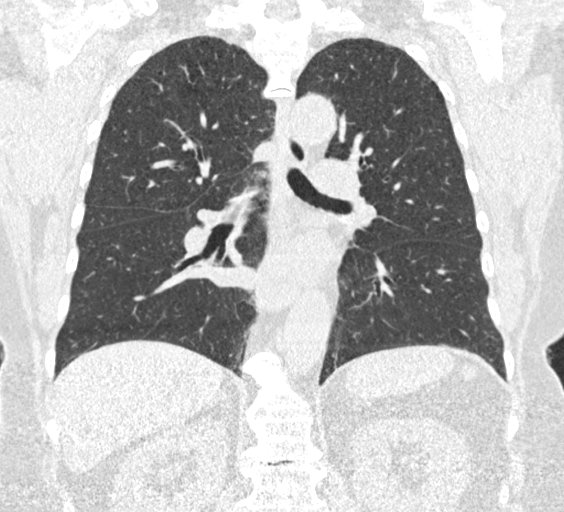

[15 of 40 positions shown; findings below may reference images not displayed]

FINDINGS: Cardiovascular: Atherosclerotic calcification of the aorta, aortic
valve and coronary arteries. Lipomatous hypertrophy of the
interatrial septum. Heart size normal. No pericardial effusion.

Mediastinum/Nodes: No pathologically enlarged mediastinal or
axillary lymph nodes. Hilar regions are difficult to evaluate
without IV contrast. Esophagus is grossly unremarkable.

Lungs/Pleura: Centrilobular and paraseptal emphysema. Pulmonary
nodules measure up to 11.2 mm in the left upper lobe, similar. No
new or worrisome pulmonary nodules. No pleural fluid. Airway is
unremarkable.

Upper Abdomen: Visualized portions of the liver, gallbladder and
adrenal glands are unremarkable. Presumed extrarenal pelvis on the
right, partially imaged. A 2.7 cm low-attenuation lesion off the
medial aspect of the interpolar left kidney is difficult to further
characterize without post-contrast imaging. Visualized portions of
the spleen, pancreas, stomach and bowel are grossly unremarkable. No
upper abdominal adenopathy.

Musculoskeletal: Degenerative changes in the spine. No worrisome
lytic or sclerotic lesions.
IMPRESSION: 1. Lung-RADS 2, benign appearance or behavior. Continue annual
screening with low-dose chest CT without contrast in 12 months.
2. Aortic atherosclerosis ([87]-[87]). Coronary artery
calcification.
3.  Emphysema ([87]-[87]).

## 2020-02-21 ENCOUNTER — Telehealth: Payer: Self-pay | Admitting: Family

## 2020-02-21 DIAGNOSIS — N2889 Other specified disorders of kidney and ureter: Secondary | ICD-10-CM

## 2020-02-21 NOTE — Telephone Encounter (Signed)
LMTCB

## 2020-02-21 NOTE — Telephone Encounter (Signed)
Call pt  I received results from your annual CT lung scan from [facility] which showed benign findings of lungs. You will need do this again next year so please ensure you are contacted in regards to this and certainly call our office if you are not contacted for another test.   It was noted again on the exam that you have coronary artery atherosclerosis, or often referred to as hardening of the arteries.   This can put you at risk for stroke, heart attack.   Please make a follow up appointment with me so can continue to monitor your cholesterol . Stay on the lipitor.   CT also showed 2.7 cm low-attenuation lesion off the medial aspect of the interpolar left kidney   I have ordered US renal for this Let us know if you dont hear back within a week in regards to an appointment being scheduled.

## 2020-02-21 NOTE — Telephone Encounter (Signed)
Left pt a vm to call ofc to sch [place].

## 2020-02-24 ENCOUNTER — Other Ambulatory Visit: Payer: Self-pay | Admitting: Family

## 2020-02-24 NOTE — Telephone Encounter (Signed)
Patient returned call and received lab results. Verbalized understanding and has appointment scheduled for 03/13/2020. Renal Ultrasound has already been scheduled for 03/02/2020.

## 2020-02-24 NOTE — Telephone Encounter (Signed)
Patient requested Fingerstick Lancets reordered to pharmacy. Refill has been sent.

## 2020-02-25 ENCOUNTER — Encounter: Payer: Self-pay | Admitting: *Deleted

## 2020-03-02 ENCOUNTER — Other Ambulatory Visit: Payer: Self-pay

## 2020-03-02 ENCOUNTER — Ambulatory Visit
Admission: RE | Admit: 2020-03-02 | Discharge: 2020-03-02 | Disposition: A | Discharge status: Home / Self Care | Payer: Medicare Other | Source: Ambulatory Visit | Attending: Family | Admitting: Family

## 2020-03-02 DIAGNOSIS — N2889 Other specified disorders of kidney and ureter: Secondary | ICD-10-CM | POA: Diagnosis not present

## 2020-03-02 DIAGNOSIS — N281 Cyst of kidney, acquired: Secondary | ICD-10-CM | POA: Diagnosis not present

## 2020-03-02 IMAGING — US US RENAL
1 series · 14 of 25 positions shown · non-contrast
Comparison: Low-dose lung cancer screening chest CT on [DATE]

CLINICAL DATA: Indeterminate left renal lesion seen on recent chest
CT.

EXAM:
RENAL / URINARY TRACT ULTRASOUND COMPLETE

[Series 1: us renal · 0.26mm/px · 57 acquisitions, 14 frames shown]
[im 1/57]
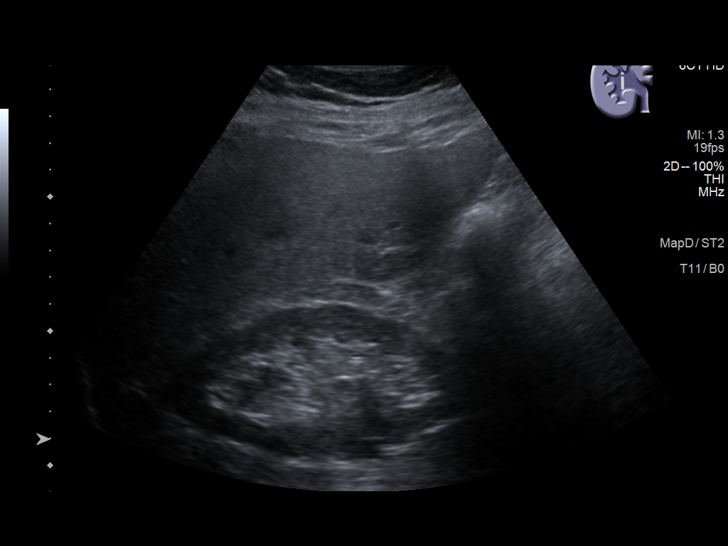
[im 5/57]
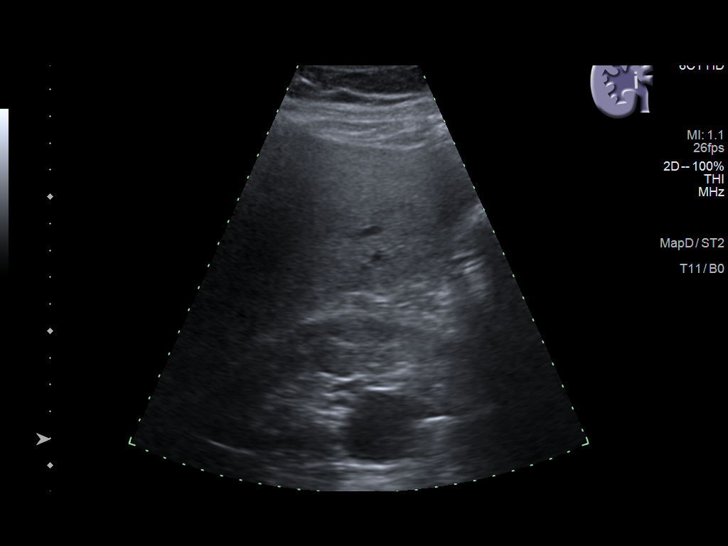
[im 10/57]
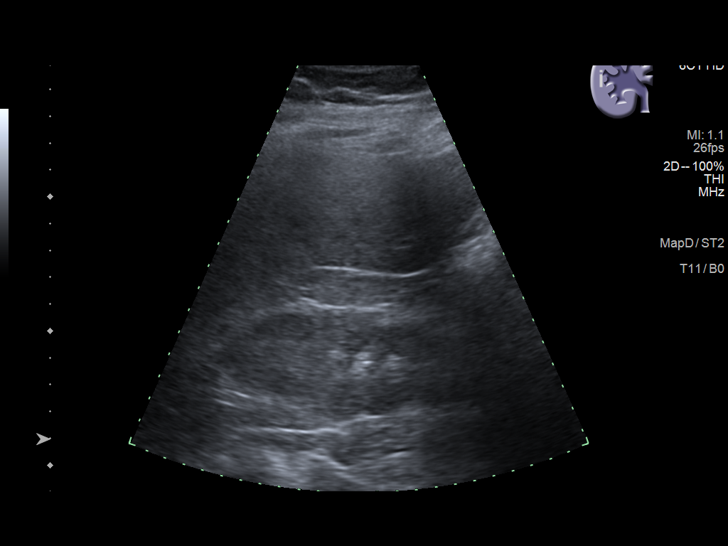
[im 15/57]
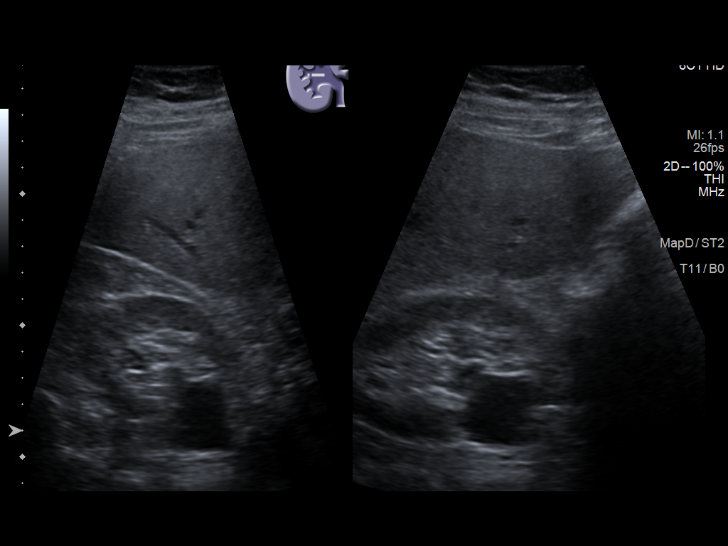
[im 19/57]
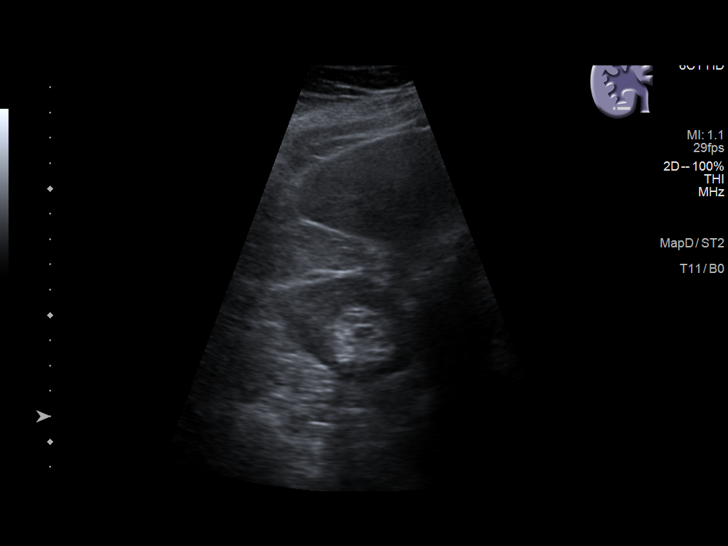
[im 22/57]
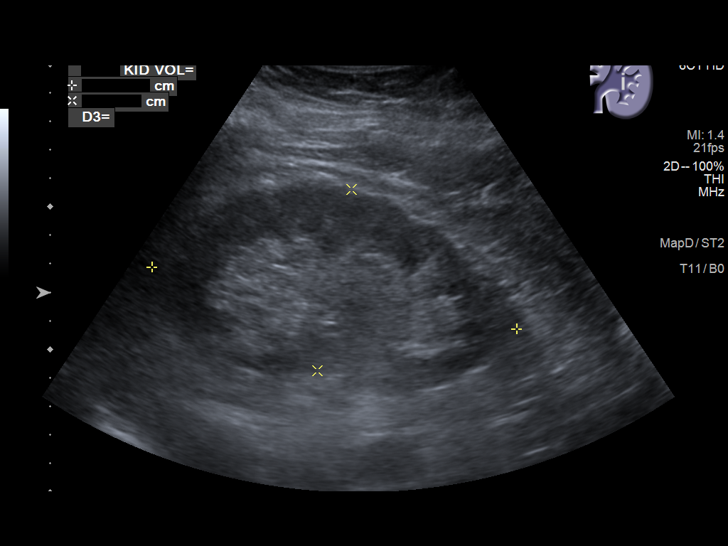
[im 26/57]
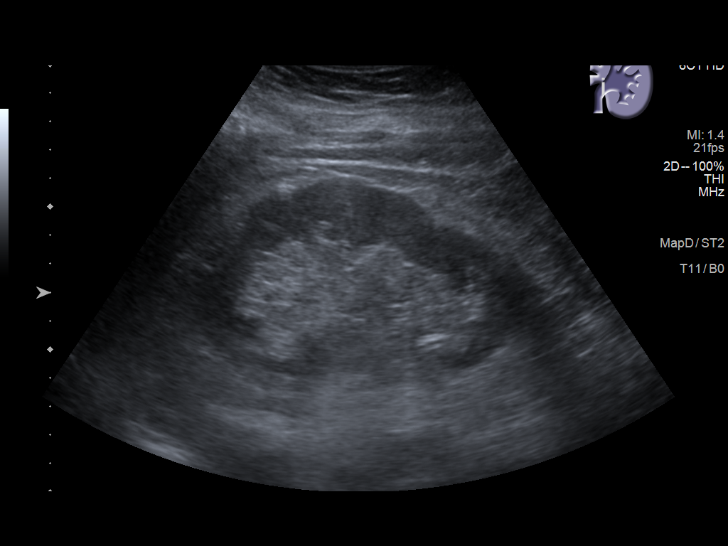
[im 31/57]
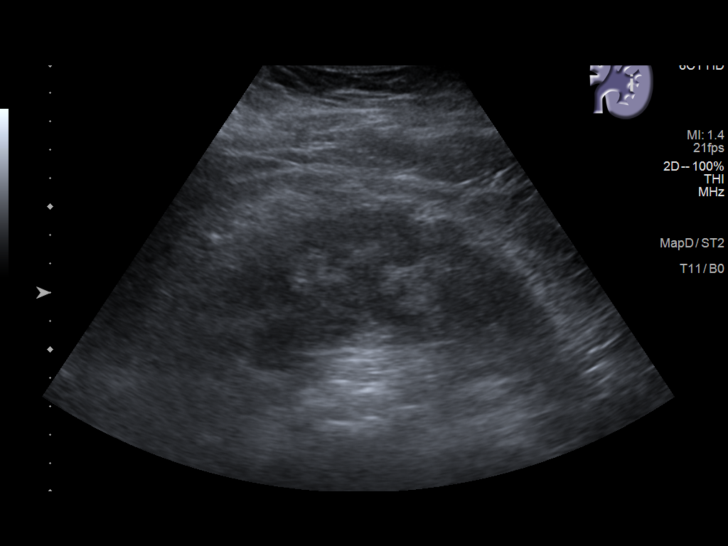
[im 36/57]
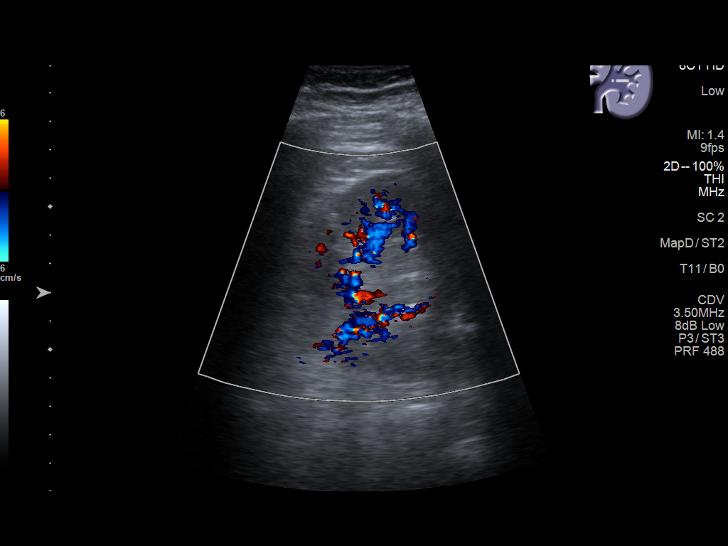
[im 38/57]
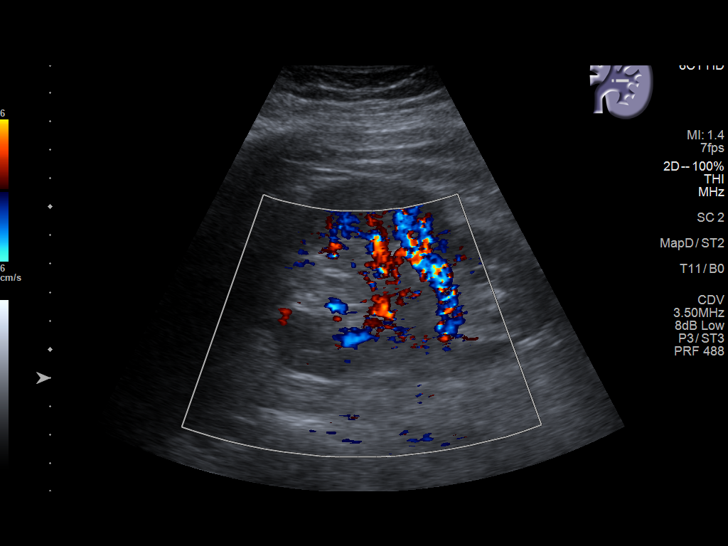
[im 43/57]
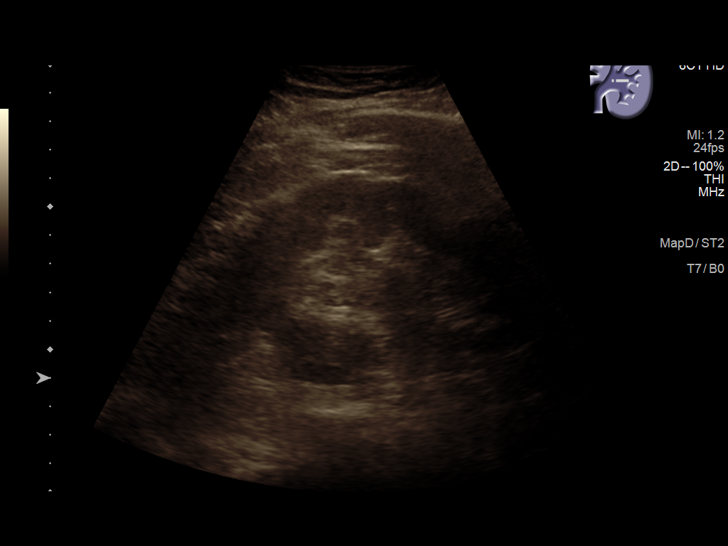
[im 47/57]
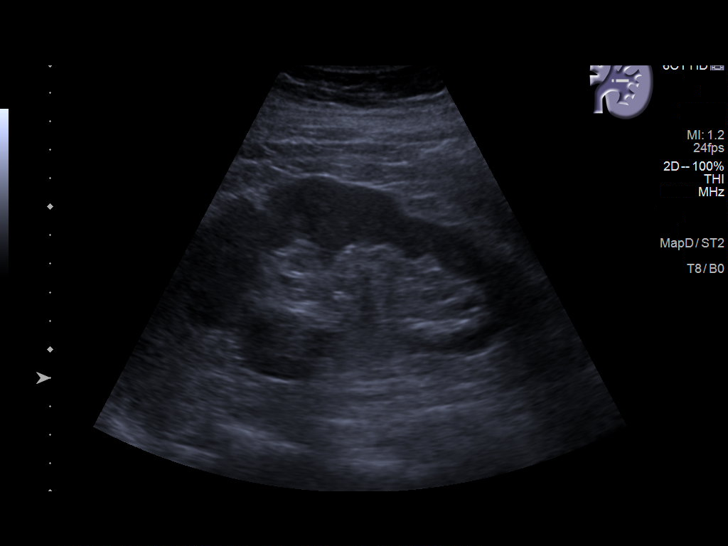
[im 52/57]
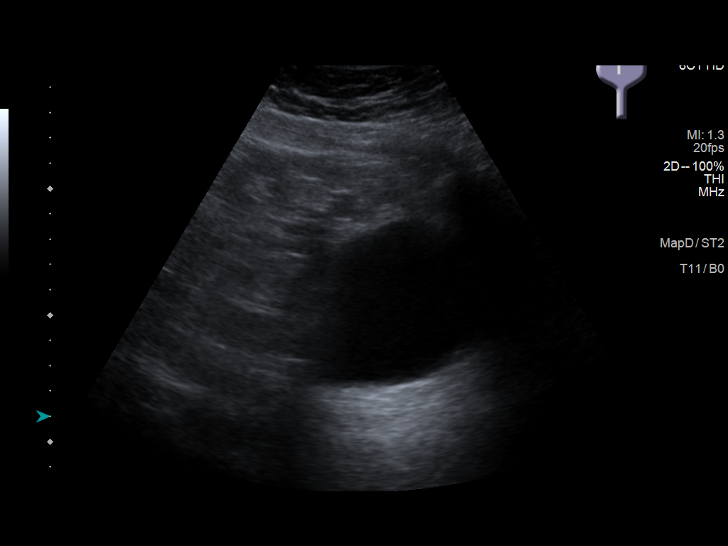
[im 57/57]
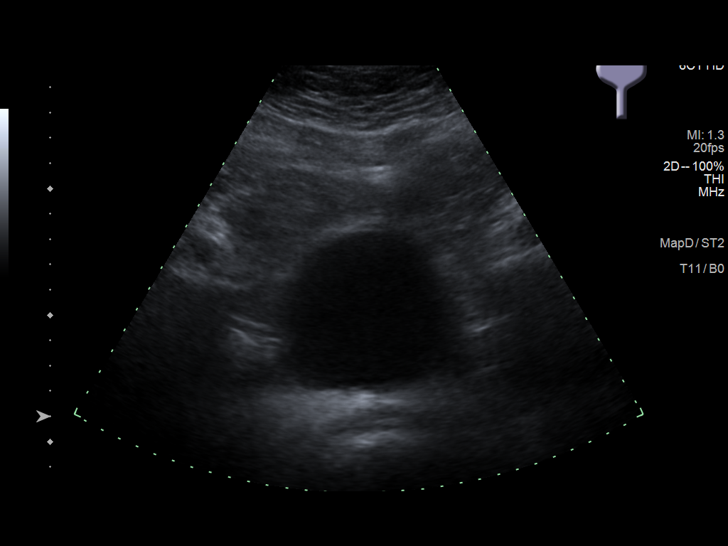

[14 of 25 positions shown; findings below may reference images not displayed]

FINDINGS: Right Kidney:

Renal measurements: 11.9 x 5.7 x 5.6 cm = volume: 199 mL .
Echogenicity within normal limits. A cyst is seen in the midpole
which measures 2.8 cm in maximum diameter. No mass or hydronephrosis
visualized.

Left Kidney:

Renal measurements: 13.0 x 6.5 x 4.6 cm = volume: 203 mL.
Echogenicity within normal limits. A hypoechoic mass is seen in the
mid to upper pole which appears solid and measures 2.7 x 2.2 x
cm. No mass or hydronephrosis visualized.

Bladder:

Appears normal for degree of bladder distention.

Other:

None.
IMPRESSION: 2.7 cm solid appearing lesion in left kidney is suspicious for renal
cell carcinoma. Recommend abdomen MRI without and with contrast for
further characterization.

2.8 cm right renal cyst.

No evidence of hydronephrosis.

## 2020-03-04 ENCOUNTER — Other Ambulatory Visit: Payer: Self-pay | Admitting: Family

## 2020-03-04 ENCOUNTER — Encounter: Payer: Self-pay | Admitting: Family

## 2020-03-04 ENCOUNTER — Telehealth: Payer: Self-pay | Admitting: Family

## 2020-03-04 DIAGNOSIS — N2889 Other specified disorders of kidney and ureter: Secondary | ICD-10-CM

## 2020-03-04 NOTE — Telephone Encounter (Signed)
Left pt vm to call ofc to sch MRI and also Trish called pt too.

## 2020-03-05 NOTE — Telephone Encounter (Signed)
PT called back returning your call  

## 2020-03-09 ENCOUNTER — Other Ambulatory Visit: Payer: Self-pay

## 2020-03-09 ENCOUNTER — Telehealth: Payer: Self-pay

## 2020-03-09 ENCOUNTER — Telehealth: Payer: Self-pay | Admitting: Family

## 2020-03-09 ENCOUNTER — Ambulatory Visit
Admission: RE | Admit: 2020-03-09 | Discharge: 2020-03-09 | Disposition: A | Discharge status: Home / Self Care | Payer: Medicare Other | Source: Ambulatory Visit | Attending: Family | Admitting: Family

## 2020-03-09 DIAGNOSIS — N2889 Other specified disorders of kidney and ureter: Secondary | ICD-10-CM | POA: Diagnosis not present

## 2020-03-09 DIAGNOSIS — N281 Cyst of kidney, acquired: Secondary | ICD-10-CM | POA: Diagnosis not present

## 2020-03-09 MED ORDER — GADOBUTROL 1 MMOL/ML IV SOLN
10.0000 mL | Freq: Once | INTRAVENOUS | Status: AC | PRN
  Administered 2020-03-09: 10 mL via INTRAVENOUS

## 2020-03-09 NOTE — Telephone Encounter (Signed)
I Called pt Advised her of the suspicious left renal mass.  Reassured her that this point that we were not certain as to what the lesion was however there was concern for malignancy.  She is aware that Dr. Collie Siad office will be calling her.  She will keep her appointment with me this Friday and we will discuss MRI ab image in detail

## 2020-03-09 NOTE — Telephone Encounter (Signed)
Done... Pt is aware of her sched MD Only MRI results appt on 03/11/20 @ 9:30

## 2020-03-09 NOTE — Telephone Encounter (Signed)
Please schedule pt for MD only this week for MRI results. Notify pt please. Thanks

## 2020-03-11 ENCOUNTER — Inpatient Hospital Stay: Payer: Medicare Other

## 2020-03-11 ENCOUNTER — Other Ambulatory Visit: Payer: Self-pay

## 2020-03-11 ENCOUNTER — Encounter: Payer: Self-pay | Admitting: Oncology

## 2020-03-11 ENCOUNTER — Inpatient Hospital Stay: Payer: Medicare Other | Attending: Oncology | Admitting: Oncology

## 2020-03-11 VITALS — BP 115/77 | HR 82 | Temp 98.5°F | Resp 18 | Wt 244.1 lb

## 2020-03-11 DIAGNOSIS — Z809 Family history of malignant neoplasm, unspecified: Secondary | ICD-10-CM | POA: Diagnosis not present

## 2020-03-11 DIAGNOSIS — Z833 Family history of diabetes mellitus: Secondary | ICD-10-CM | POA: Insufficient documentation

## 2020-03-11 DIAGNOSIS — Z87891 Personal history of nicotine dependence: Secondary | ICD-10-CM | POA: Diagnosis not present

## 2020-03-11 DIAGNOSIS — Z8349 Family history of other endocrine, nutritional and metabolic diseases: Secondary | ICD-10-CM | POA: Insufficient documentation

## 2020-03-11 DIAGNOSIS — Z803 Family history of malignant neoplasm of breast: Secondary | ICD-10-CM | POA: Diagnosis not present

## 2020-03-11 DIAGNOSIS — M1990 Unspecified osteoarthritis, unspecified site: Secondary | ICD-10-CM | POA: Insufficient documentation

## 2020-03-11 DIAGNOSIS — Z7984 Long term (current) use of oral hypoglycemic drugs: Secondary | ICD-10-CM | POA: Diagnosis not present

## 2020-03-11 DIAGNOSIS — Z8249 Family history of ischemic heart disease and other diseases of the circulatory system: Secondary | ICD-10-CM | POA: Diagnosis not present

## 2020-03-11 DIAGNOSIS — E785 Hyperlipidemia, unspecified: Secondary | ICD-10-CM | POA: Insufficient documentation

## 2020-03-11 DIAGNOSIS — E119 Type 2 diabetes mellitus without complications: Secondary | ICD-10-CM | POA: Diagnosis not present

## 2020-03-11 DIAGNOSIS — N289 Disorder of kidney and ureter, unspecified: Secondary | ICD-10-CM | POA: Diagnosis not present

## 2020-03-11 DIAGNOSIS — K760 Fatty (change of) liver, not elsewhere classified: Secondary | ICD-10-CM | POA: Diagnosis not present

## 2020-03-11 DIAGNOSIS — C7A8 Other malignant neuroendocrine tumors: Secondary | ICD-10-CM | POA: Diagnosis not present

## 2020-03-11 DIAGNOSIS — I10 Essential (primary) hypertension: Secondary | ICD-10-CM | POA: Insufficient documentation

## 2020-03-11 DIAGNOSIS — I2510 Atherosclerotic heart disease of native coronary artery without angina pectoris: Secondary | ICD-10-CM | POA: Diagnosis not present

## 2020-03-11 DIAGNOSIS — Z79899 Other long term (current) drug therapy: Secondary | ICD-10-CM | POA: Diagnosis not present

## 2020-03-11 LAB — COMPREHENSIVE METABOLIC PANEL
ALT: 26 U/L (ref 0–44)
AST: 28 U/L (ref 15–41)
Albumin: 4.1 g/dL (ref 3.5–5.0)
Alkaline Phosphatase: 59 U/L (ref 38–126)
Anion gap: 9 (ref 5–15)
BUN: 14 mg/dL (ref 8–23)
CO2: 28 mmol/L (ref 22–32)
Calcium: 9.2 mg/dL (ref 8.9–10.3)
Chloride: 101 mmol/L (ref 98–111)
Creatinine, Ser: 0.83 mg/dL (ref 0.44–1.00)
GFR calc Af Amer: >60 mL/min (ref >60)
GFR calc non Af Amer: >60 mL/min (ref >60)
Glucose, Bld: 124 mg/dL — ABNORMAL HIGH (ref 70–99)
Potassium: 4 mmol/L (ref 3.5–5.1)
Sodium: 138 mmol/L (ref 135–145)
Total Bilirubin: 0.7 mg/dL (ref 0.3–1.2)
Total Protein: 7.3 g/dL (ref 6.5–8.1)

## 2020-03-11 LAB — CBC WITH DIFFERENTIAL/PLATELET
Abs Immature Granulocytes: 0.01 10*3/uL (ref 0.00–0.07)
Basophils Absolute: 0 10*3/uL (ref 0.0–0.1)
Basophils Relative: 1 %
Eosinophils Absolute: 0.2 10*3/uL (ref 0.0–0.5)
Eosinophils Relative: 5 %
HCT: 40.4 % (ref 36.0–46.0)
Hemoglobin: 13.9 g/dL (ref 12.0–15.0)
Immature Granulocytes: 0 %
Lymphocytes Relative: 37 %
Lymphs Abs: 1.7 10*3/uL (ref 0.7–4.0)
MCH: 32.2 pg (ref 26.0–34.0)
MCHC: 34.4 g/dL (ref 30.0–36.0)
MCV: 93.5 fL (ref 80.0–100.0)
Monocytes Absolute: 0.8 10*3/uL (ref 0.1–1.0)
Monocytes Relative: 19 %
Neutro Abs: 1.7 10*3/uL (ref 1.7–7.7)
Neutrophils Relative %: 38 %
Platelets: 273 10*3/uL (ref 150–400)
RBC: 4.32 MIL/uL (ref 3.87–5.11)
RDW: 12.8 % (ref 11.5–15.5)
WBC: 4.5 10*3/uL (ref 4.0–10.5)
nRBC: 0 % (ref 0.0–0.2)

## 2020-03-11 LAB — LACTATE DEHYDROGENASE: LDH: 166 U/L (ref 98–192)

## 2020-03-11 NOTE — Progress Notes (Signed)
Patient here for oncology follow-up appointment, expresses no complaints or concerns at this time.    

## 2020-03-11 NOTE — Progress Notes (Signed)
Hematology/Oncology follow up note [facility] [phone] [phone]) [phone]   Patient Care Team: Burnard Hawthorne, FNP as PCP - General (Family Medicine) Jackolyn Confer, MD (Internal Medicine) Clent Jacks, RN as Registered Nurse  REFERRING PROVIDER: Gastroenterology: Napoleon Form. REASON FOR VISIT Follow up for management of neuroendocrine cancer of the colon and discussion about dotatate PET scan results.  HISTORY OF PRESENTING ILLNESS:  Cynthia [name] is a  74 y.o.  female with PMH listed below who was referred to me for evaluation of neuroendocrine cancer  She recently have screening colonoscopy done and was found to have 3 ascending colon polyps and 6 sigmoid colon polyps, all removed and retrieved. All three ascending colon polyps were tubular adenoma. 5 sigmoid colon polyps were hyperplastic polyps. One polyp turned out to be well defferentiated neuroendocrine tumor, 2.49mm, grade 1.   The tumor was small and the tattoo of the site of tumor removal is not feasible per GI.  Case was discussed on tumor board and recommend surveillance.  02/09/2018 dotatate PET scan was negative Patient had colonoscopy surveillance   # 1 Year colonoscopy: S/p repeat colonoscopy on 08/10/2018. Polyps resected. Pathology negative malignancy.   INTERVAL HISTORY Cynthia [name] is a 74 y.o. female who has above history reviewed by me today presents for follow up of neuroendocrine cancer of colon, new kidney mass Patient reports doing well. She recently had a CT chest lung cancer screening.  With incidental findings of low attenuation lesion of the left kidney.  03/02/2020 ultrasound kidney showed 2.7 cm solid-appearing lesion of the left kidney which is suspicious for renal cell carcinoma. 03/09/2020 MRI abdomen with and without contrast showed complex cystic lesion arising from the upper pole of the left kidney measuring 2.1 cm.  Compatible with cystic renal  cell carcinoma.  She also has right kidney lesion. Mild hepatic steatosis Reports doing well.  She denies any diarrhea, facial flush, wheezing, abdominal pain, blood in the stool.   Review of Systems  Constitutional: Negative for chills, fever, malaise/fatigue and weight loss.  HENT: Negative for sore throat.   Eyes: Negative for redness.  Respiratory: Negative for cough, shortness of breath and wheezing.   Cardiovascular: Negative for chest pain, palpitations and leg swelling.  Gastrointestinal: Negative for abdominal pain, blood in stool, nausea and vomiting.  Genitourinary: Negative for dysuria.  Musculoskeletal: Positive for joint pain. Negative for myalgias.  Skin: Negative for rash.  Neurological: Negative for dizziness, tingling and tremors.  Endo/Heme/Allergies: Does not bruise/bleed easily.  Psychiatric/Behavioral: Negative for hallucinations.    MEDICAL HISTORY:  Past Medical History:  Diagnosis Date  . Arthritis   . 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   Marland Kitchen COPD (chronic obstructive pulmonary disease) ([place])   . Coronary artery disease   . Diabetes mellitus without complication ([place])   . Hyperlipidemia   . Hypertension   . Lung nodule   . Thyroid disease     SURGICAL HISTORY: Past Surgical History:  Procedure Laterality Date  . BREAST BIOPSY Left 11/25/2015   stereo  neg  . BREAST EXCISIONAL BIOPSY Left yrs ago   benign  . COLONOSCOPY WITH PROPOFOL N/A 04/20/2017   Procedure: COLONOSCOPY WITH PROPOFOL;  Surgeon: [name], MD;  Location: [facility] ENDOSCOPY;  Service: Endoscopy;  Laterality: N/A;  . COLONOSCOPY WITH PROPOFOL N/A 01/08/2018   Procedure: COLONOSCOPY WITH PROPOFOL;  Surgeon: [name], MD;  Location: [facility] ENDOSCOPY;  Service: Gastroenterology;  Laterality: N/A;  . COLONOSCOPY  WITH PROPOFOL N/A 08/10/2018   Procedure: COLONOSCOPY WITH PROPOFOL;  Surgeon: [name], MD;  Location: [facility] ENDOSCOPY;  Service: Gastroenterology;  Laterality: N/A;    . THYROIDECTOMY     Dr. [name]  . VAGINAL DELIVERY     4    SOCIAL HISTORY: Social History   Socioeconomic History  . Marital status: Married    Spouse name: Not on file  . Number of children: 4  . Years of education: Not on file  . Highest education level: Not on file  Occupational History  . Not on file  Tobacco Use  . Smoking status: Former Smoker    Packs/day: 0.75    Years: 48.00    Pack years: 36.00    Types: Cigarettes    Quit date: 2015    Years since quitting: 6.4  . Smokeless tobacco: Former Systems developer    Types: Snuff, Chew  Vaping Use  . Vaping Use: Never used  Substance and Sexual Activity  . Alcohol use: No  . Drug use: No  . Sexual activity: Never  Other Topics Concern  . Not on file  Social History Narrative   Lives in [place] with husband. Has 4 children.      4 grandchildren.      Work - Retired from [employer]- walking the track, 4x per week      Diet- regular      Social Determinants of Radio broadcast assistant Strain:   . Difficulty of Paying Living Expenses:   Food Insecurity:   . Worried About Charity fundraiser in the Last Year:   . Arboriculturist in the Last Year:   Transportation Needs:   . Film/video editor (Medical):   Marland Kitchen Lack of Transportation (Non-Medical):   Physical Activity: Sufficiently Active  . Days of Exercise per Week: 5 days  . Minutes of Exercise per Session: 30 min  Stress: No Stress Concern Present  . Feeling of Stress : Not at all  Social Connections:   . Frequency of Communication with Friends and Family:   . Frequency of Social Gatherings with Friends and Family:   . Attends Religious Services:   . Active Member of Clubs or Organizations:   . Attends Archivist Meetings:   Marland Kitchen Marital Status:   Intimate Partner Violence:   . Fear of Current or Ex-Partner:   . Emotionally Abused:   Marland Kitchen Physically Abused:   . Sexually Abused:     FAMILY HISTORY: Family History   Problem Relation Age of Onset  . Hypertension Mother   . Hypertension Father   . Diabetes Sister   . Thyroid disease Sister   . Breast cancer Maternal Aunt   . Breast cancer Maternal Aunt     ALLERGIES:  has No Known Allergies.  MEDICATIONS:  Current Outpatient Medications  Medication Sig Dispense Refill  . amLODipine (NORVASC) 10 MG tablet TAKE 1 TABLET (10 MG TOTAL) BY MOUTH DAILY. 90 tablet 3  . aspirin 81 MG tablet Take 81 mg by mouth daily.    Marland Kitchen atorvastatin (LIPITOR) 20 MG tablet TAKE 1 TABLET (20 MG TOTAL) BY MOUTH DAILY. 90 tablet 0  . Calcium Carbonate-Vit D-Min (CALCIUM 600+D PLUS MINERALS) 600-400 MG-UNIT TABS Take by mouth.    . diclofenac sodium (VOLTAREN) 1 % GEL Apply 2 g topically 4 (four) times daily.    . DULoxetine (CYMBALTA) 30 MG capsule TAKE 1 CAPSULE BY MOUTH ONCE  A DAY FOR FIRST WEEK, THEN INCREASE TO 2 CAPS BY MOUTH ONCE DAILY 60 capsule 3  . glucose blood ([employer] GLUCOSE METER TEST STRIPS) test strip TEST BLOOD SUGAR 1-2 TIMES A DAY    . glucose blood (ONE TOUCH ULTRA TEST) test strip TEST BLOOD SUGAR 1-2 TIMES A DAY 100 each 4  . Lancets (ONETOUCH DELICA PLUS [id number]) MISC USE AS INSTRUCTED 100 each 12  . levothyroxine (SYNTHROID) 137 MCG tablet Take 1 tablet (137 mcg total) by mouth daily before breakfast. 90 tablet 1  . losartan-hydrochlorothiazide (HYZAAR) 50-12.5 MG tablet Take 1 tablet by mouth daily. 90 tablet 1  . meloxicam (MOBIC) 7.5 MG tablet Take 1 tablet (7.5 mg total) by mouth daily as needed for pain. Take with food. 90 tablet 0  . metFORMIN (GLUCOPHAGE) 500 MG tablet Take one tablet by mouth two times a day. 180 tablet 1  . metoprolol tartrate (LOPRESSOR) 25 MG tablet Take 1 tablet (25 mg total) by mouth 2 (two) times daily. 90 tablet 1   No current facility-administered medications for this visit.     PHYSICAL EXAMINATION: ECOG PERFORMANCE STATUS: 0 - Asymptomatic Vitals:   03/11/20 0940  BP: 115/77  Pulse: 82  Resp: 18  Temp: 98.5  F (36.9 C)  SpO2: 100%   Filed Weights   03/11/20 0940  Weight: 244 lb 1.6 oz (110.7 kg)    Physical Exam Constitutional:      General: She is not in acute distress.    Appearance: She is well-developed.  HENT:     Head: Normocephalic and atraumatic.  Eyes:     General: No scleral icterus.    Conjunctiva/sclera: Conjunctivae normal.     Pupils: Pupils are equal, round, and reactive to light.  Cardiovascular:     Rate and Rhythm: Normal rate and regular rhythm.     Heart sounds: Normal heart sounds.  Pulmonary:     Effort: Pulmonary effort is normal. No respiratory distress.     Breath sounds: Normal breath sounds. No wheezing.  Abdominal:     General: Bowel sounds are normal. There is no distension.     Palpations: Abdomen is soft. There is no mass.     Tenderness: There is no abdominal tenderness.  Musculoskeletal:        General: No deformity. Normal range of motion.     Cervical back: Normal range of motion and neck supple.  Lymphadenopathy:     Cervical: No cervical adenopathy.  Skin:    General: Skin is warm and dry.     Findings: No erythema or rash.  Neurological:     Mental Status: She is alert and oriented to person, place, and time. Mental status is at baseline.     Cranial Nerves: No cranial nerve deficit.     Coordination: Coordination normal.  Psychiatric:        Mood and Affect: Mood normal.        Thought Content: Thought content normal.      LABORATORY DATA:  I have reviewed the data as listed Lab Results  Component Value Date   WBC 4.5 03/11/2020   HGB 13.9 03/11/2020   HCT 40.4 03/11/2020   MCV 93.5 03/11/2020   PLT 273 03/11/2020   Recent Labs    09/09/19 1007 12/06/19 1201 03/11/20 1028  NA 136 136 138  K 3.7 3.8 4.0  CL 99 100 101  CO2 29 31 28   GLUCOSE 130* 94 124*  BUN 15 12 14  CREATININE 0.71 0.70 0.83  CALCIUM 9.7 9.7 9.2  GFRNONAA >60  --  >60  GFRAA >60  --  >60  PROT 7.4 7.1 7.3  ALBUMIN 4.2 4.1 4.1  AST 26 25 28    ALT 24 24 26   ALKPHOS 63 60 59  BILITOT 0.6 0.6 0.7    Serum serotonin level at 111 within normal range 5 HIAA urine test was obtained and patient has not gotten done yet.   ASSESSMENT & PLAN:  1. 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 Kidney lesion   3. 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 chromogranin A has been followed today's level is pending. 03/12/2019 41.6 ng/ ml 08/20/2018 1 nmol/L 09/09/2019 1nmol/L  5 HIAA quantitative- 24h urine will be collected today. 01/14/2019  3.8 02/15/2018   1.6 04/27//2020 1.8  Former smoker, continue lung cancer screening program.   #Kidney lesion, clinically T1 lesion.  No evidence of distant metastasis.  I will refer patient to establish care with urology for evaluation of resection. Check LDH  Family history of breast cancer.  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   I discussed with patient about genetic testing.  Patient would like to defer after kidney lesion work-up and treatments.  Follow-up in 3 months   all questions were answered. The patient knows to call the clinic with any problems questions or concerns.    Earlie Server, MD, PhD Hematology Oncology [facility] at [facility] Pager- [phone] 03/11/2020

## 2020-03-13 ENCOUNTER — Encounter: Payer: Self-pay | Admitting: Family

## 2020-03-13 ENCOUNTER — Other Ambulatory Visit: Payer: Self-pay

## 2020-03-13 ENCOUNTER — Ambulatory Visit (INDEPENDENT_AMBULATORY_CARE_PROVIDER_SITE_OTHER): Payer: Medicare Other | Admitting: Family

## 2020-03-13 VITALS — BP 118/74 | HR 72 | Temp 98.6°F | Ht 68.5 in | Wt 243.0 lb

## 2020-03-13 DIAGNOSIS — K760 Fatty (change of) liver, not elsewhere classified: Secondary | ICD-10-CM

## 2020-03-13 DIAGNOSIS — G8929 Other chronic pain: Secondary | ICD-10-CM

## 2020-03-13 DIAGNOSIS — M25561 Pain in right knee: Secondary | ICD-10-CM

## 2020-03-13 DIAGNOSIS — M25562 Pain in left knee: Secondary | ICD-10-CM | POA: Diagnosis not present

## 2020-03-13 DIAGNOSIS — I10 Essential (primary) hypertension: Secondary | ICD-10-CM

## 2020-03-13 DIAGNOSIS — E1140 Type 2 diabetes mellitus with diabetic neuropathy, unspecified: Secondary | ICD-10-CM

## 2020-03-13 LAB — CHROMOGRANIN A: Chromogranin A (ng/mL): 30.7 ng/mL (ref 0.0–101.8)

## 2020-03-13 MED ORDER — MELOXICAM 7.5 MG PO TABS: 7.5000 mg | ORAL_TABLET | Freq: Every day | ORAL | 0 refills | Status: DC | PRN

## 2020-03-13 MED ORDER — AMLODIPINE BESYLATE 10 MG PO TABS: ORAL_TABLET | ORAL | 3 refills | Status: DC

## 2020-03-13 NOTE — Assessment & Plan Note (Signed)
Well-controlled.  Continue current regimen. 

## 2020-03-13 NOTE — Patient Instructions (Signed)
I will follow along; let me know if you need anything at all.   As discussed, fatty liver disease seen on recent image.  Nonalcoholic Fatty Liver Disease Diet Introduction Nonalcoholic fatty liver disease is a condition that causes fat to accumulate in and around the liver. The disease makes it harder for the liver to work the way that it should. Following a healthy diet can help to keep nonalcoholic fatty liver disease under control. It can also help to prevent or improve conditions that are associated with the disease, such as heart disease, diabetes, high blood pressure, and abnormal cholesterol levels. Along with regular exercise, this diet:  Promotes weight loss.  Helps to control blood sugar levels.  Helps to improve the way that the body uses insulin. What do I need to know about this diet?  Use the glycemic index (GI) to plan your meals. The index tells you how quickly a food will raise your blood sugar. Choose low-GI foods. These foods take a longer time to raise blood sugar.  Keep track of how many calories you take in. Eating the right amount of calories will help you to achieve a healthy weight.  You may want to follow a Mediterranean diet. This diet includes a lot of vegetables, lean meats or fish, whole grains, fruits, and healthy oils and fats. What foods can I eat? Grains  Whole grains, such as whole-wheat or whole-grain breads, crackers, tortillas, cereals, and pasta. Stone-ground whole wheat. Pumpernickel bread. Unsweetened oatmeal. Bulgur. Barley. Quinoa. Brown or wild rice. Corn or whole-wheat flour tortillas. Vegetables  Lettuce. Spinach. Peas. Beets. Cauliflower. Cabbage. Broccoli. Carrots. Tomatoes. Squash. Eggplant. Herbs. Peppers. Onions. Cucumbers. Brussels sprouts. Yams and sweet potatoes. Beans. Lentils. Fruits  Bananas. Apples. Oranges. Grapes. Papaya. Mango. Pomegranate. Kiwi. Grapefruit. Cherries. Meats and Other Protein Sources  Seafood and shellfish. Lean  meats. Poultry. Tofu. Dairy  Low-fat or fat-free dairy products, such as yogurt, cottage cheese, and cheese. Beverages  Water. Sugar-free drinks. Tea. Coffee. Low-fat or skim milk. Milk alternatives, such as soy or almond milk. Real fruit juice. Condiments  Mustard. Relish. Low-fat, low-sugar ketchup and barbecue sauce. Low-fat or fat-free mayonnaise. Sweets and Desserts  Sugar-free sweets. Fats and Oils  Avocado. Canola or olive oil. Nuts and nut butters. Seeds. The items listed above may not be a complete list of recommended foods or beverages. Contact your dietitian for more options.  What foods are not recommended? Palm oil and coconut oil. Processed foods. Fried foods. Sweetened drinks, such as sweet tea, milkshakes, snow cones, iced sweet drinks, and sodas. Alcohol. Sweets. Foods that contain a lot of salt or sodium. The items listed above may not be a complete list of foods and beverages to avoid. Contact your dietitian for more information.  This information is not intended to replace advice given to you by your health care provider. Make sure you discuss any questions you have with your health care provider. Document Released: 01/20/2015 Document Revised: 02/11/2016 Document Reviewed: 09/30/2014  2017 Elsevier  Fatty Liver Introduction  Fatty liver, also called hepatic steatosis or steatohepatitis, is a condition in which too much fat has built up in your liver cells. The liver removes harmful substances from your bloodstream. It produces fluids your body needs. It also helps your body use and store energy from the food you eat. In many cases, fatty liver does not cause symptoms or problems. It is often diagnosed when tests are being done for other reasons. However, over time, fatty liver can cause  inflammation that may lead to more serious liver problems, such as scarring of the liver (cirrhosis). What are the causes? Causes of fatty liver may include:  Drinking too much  alcohol.  Poor nutrition.  Obesity.  Cushing syndrome.  Diabetes.  Hyperlipidemia.  Pregnancy.  Certain drugs.  Poisons.  Some viral infections. What increases the risk? You may be more likely to develop fatty liver if you:  Abuse alcohol.  Are pregnant.  Are overweight.  Have diabetes.  Have hepatitis.  Have a high triglyceride level. What are the signs or symptoms? Fatty liver often does not cause any symptoms. In cases where symptoms develop, they can include:  Fatigue.  Weakness.  Weight loss.  Confusion.  Abdominal pain.  Yellowing of your skin and the white parts of your eyes (jaundice).  Nausea and vomiting. How is this diagnosed? Fatty liver may be diagnosed by:  Physical exam and medical history.  Blood tests.  Imaging tests, such as an ultrasound, CT scan, or MRI.  Liver biopsy. A small sample of liver tissue is removed using a needle. The sample is then looked at under a microscope. How is this treated? Fatty liver is often caused by other health conditions. Treatment for fatty liver may involve medicines and lifestyle changes to manage conditions such as:  Alcoholism.  High cholesterol.  Diabetes.  Being overweight or obese. Follow these instructions at home:  Eat a healthy diet as directed by your health care provider.  Exercise regularly. This can help you lose weight and control your cholesterol and diabetes. Talk to your health care provider about an exercise plan and which activities are best for you.  Do not drink alcohol.  Take medicines only as directed by your health care provider. Contact a health care provider if: You have difficulty controlling your:  Blood sugar.  Cholesterol.  Alcohol consumption. Get help right away if:  You have abdominal pain.  You have jaundice.  You have nausea and vomiting. This information is not intended to replace advice given to you by your health care provider. Make sure  you discuss any questions you have with your health care provider.

## 2020-03-13 NOTE — Assessment & Plan Note (Signed)
Suspect controlled. Pending a1c

## 2020-03-13 NOTE — Assessment & Plan Note (Signed)
Discussed recent finding on ct ab. Education provided in regards to progression to cirrhosis, liver cancer. She is aware of lifestyle changes and has been reminded of these as I printed on her avs

## 2020-03-13 NOTE — Progress Notes (Signed)
Subjective:    Patient ID: Cynthia [name], female    DOB: [birth year], 74 y.o.   MRN: [id number]  CC: Cynthia [name] is a 74 y.o. female who presents today for follow up.   HPI: Feels well today No complaints.   HTN- compliant with medication. No cp.  DM- compliant with metformin  Hypothyroidism- compliant with synthroid   Has Seen Cynthia [name]- upcoming appointment with Cynthia [name]  Review MR abdomen Mild hepatic steatosis Complex cystic lesion left kidney    Mammogram scheduled HISTORY:  Past Medical History:  Diagnosis Date  . Arthritis   . 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   Marland Kitchen COPD (chronic obstructive pulmonary disease) ([place])   . Coronary artery disease   . Diabetes mellitus without complication ([place])   . Hyperlipidemia   . Hypertension   . Lung nodule   . Thyroid disease    Past Surgical History:  Procedure Laterality Date  . BREAST BIOPSY Left 11/25/2015   stereo  neg  . BREAST EXCISIONAL BIOPSY Left yrs ago   benign  . COLONOSCOPY WITH PROPOFOL N/A 04/20/2017   Procedure: COLONOSCOPY WITH PROPOFOL;  Surgeon: Cynthia [name];  Location: [facility] ENDOSCOPY;  Service: Endoscopy;  Laterality: N/A;  . COLONOSCOPY WITH PROPOFOL N/A 01/08/2018   Procedure: COLONOSCOPY WITH PROPOFOL;  Surgeon: Cynthia [name];  Location: [facility] ENDOSCOPY;  Service: Gastroenterology;  Laterality: N/A;  . COLONOSCOPY WITH PROPOFOL N/A 08/10/2018   Procedure: COLONOSCOPY WITH PROPOFOL;  Surgeon: Cynthia [name];  Location: [facility] ENDOSCOPY;  Service: Gastroenterology;  Laterality: N/A;  . THYROIDECTOMY     Cynthia. Leanora [name]  . VAGINAL DELIVERY     4   Family History  Problem Relation Age of Onset  . Hypertension Mother   . Hypertension Father   . Diabetes Sister   . Thyroid disease Sister   . Breast cancer Maternal Aunt   . Breast cancer Maternal Aunt     Allergies: Patient has no known allergies. Current Outpatient Medications on File Prior to Visit  Medication Sig Dispense  Refill  . aspirin 81 MG tablet Take 81 mg by mouth daily.    Marland Kitchen atorvastatin (LIPITOR) 20 MG tablet TAKE 1 TABLET (20 MG TOTAL) BY MOUTH DAILY. 90 tablet 0  . Calcium Carbonate-Vit D-Min (CALCIUM 600+D PLUS MINERALS) 600-400 MG-UNIT TABS Take by mouth.    . diclofenac sodium (VOLTAREN) 1 % GEL Apply 2 g topically 4 (four) times daily.    . DULoxetine (CYMBALTA) 30 MG capsule TAKE 1 CAPSULE BY MOUTH ONCE A DAY FOR FIRST WEEK, THEN INCREASE TO 2 CAPS BY MOUTH ONCE DAILY 60 capsule 3  . glucose blood ([employer]S GLUCOSE METER TEST STRIPS) test strip TEST BLOOD SUGAR 1-2 TIMES A DAY    . glucose blood (ONE TOUCH ULTRA TEST) test strip TEST BLOOD SUGAR 1-2 TIMES A DAY 100 each 4  . Lancets (ONETOUCH DELICA PLUS [id number]) MISC USE AS INSTRUCTED 100 each 12  . levothyroxine (SYNTHROID) 137 MCG tablet Take 1 tablet (137 mcg total) by mouth daily before breakfast. 90 tablet 1  . losartan-hydrochlorothiazide (HYZAAR) 50-12.5 MG tablet Take 1 tablet by mouth daily. 90 tablet 1  . metFORMIN (GLUCOPHAGE) 500 MG tablet Take one tablet by mouth two times a day. 180 tablet 1  . metoprolol tartrate (LOPRESSOR) 25 MG tablet Take 1 tablet (25 mg total) by mouth 2 (two) times daily. 90 tablet 1   No current facility-administered medications on file prior to visit.  Social History   Tobacco Use  . Smoking status: Former Smoker    Packs/day: 0.75    Years: 48.00    Pack years: 36.00    Types: Cigarettes    Quit date: 2015    Years since quitting: 6.4  . Smokeless tobacco: Former Systems developer    Types: Snuff, Chew  Vaping Use  . Vaping Use: Never used  Substance Use Topics  . Alcohol use: No  . Drug use: No    Review of Systems  Constitutional: Negative for chills and fever.  Respiratory: Negative for cough.   Cardiovascular: Negative for chest pain and palpitations.  Gastrointestinal: Negative for nausea and vomiting.  Genitourinary: Negative for flank pain.      Objective:    BP 118/74   Pulse 72    Temp 98.6 F (37 C) (Oral)   Ht 5' 8.5" (1.74 m)   Wt 243 lb (110.2 kg)   SpO2 98%   BMI 36.41 kg/m  BP Readings from Last 3 Encounters:  03/13/20 118/74  03/11/20 115/77  12/06/19 130/85   Wt Readings from Last 3 Encounters:  03/13/20 243 lb (110.2 kg)  03/11/20 244 lb 1.6 oz (110.7 kg)  02/19/20 243 lb (110.2 kg)    Physical Exam Vitals reviewed.  Constitutional:      Appearance: She is well-developed.  Eyes:     Conjunctiva/sclera: Conjunctivae normal.  Cardiovascular:     Rate and Rhythm: Normal rate and regular rhythm.     Pulses: Normal pulses.     Heart sounds: Normal heart sounds.  Pulmonary:     Effort: Pulmonary effort is normal.     Breath sounds: Normal breath sounds. No wheezing, rhonchi or rales.  Skin:    General: Skin is warm and dry.  Neurological:     Mental Status: She is alert.  Psychiatric:        Speech: Speech normal.        Behavior: Behavior normal.        Thought Content: Thought content normal.        Assessment & Plan:   Problem List Items Addressed This Visit      Cardiovascular and Mediastinum   Hypertension    Well controlled. Continue current regimen      Relevant Medications   amLODipine (NORVASC) 10 MG tablet     Digestive   Fatty liver disease, nonalcoholic    Discussed recent finding on ct ab. Education provided in regards to progression to cirrhosis, liver cancer. She is aware of lifestyle changes and has been reminded of these as I printed on her avs         Endocrine   Type 2 diabetes mellitus with diabetic neuropathy, unspecified ([place]) - Primary    Suspect controlled. Pending a1c      Relevant Orders   Hemoglobin A1c   Lipid panel   TSH     Other   Chronic pain of both knees   Relevant Medications   meloxicam (MOBIC) 7.5 MG tablet       I am having Cynthia [name] maintain her aspirin, Calcium 600+D Plus Minerals, diclofenac sodium, metoprolol tartrate, glucose blood, metFORMIN,  losartan-hydrochlorothiazide, atorvastatin, levothyroxine, DULoxetine, OneTouch Delica Plus [id number], CVS Glucose Meter Test Strips, amLODipine, and meloxicam.   Meds ordered this encounter  Medications  . amLODipine (NORVASC) 10 MG tablet    Sig: TAKE 1 TABLET (10 MG TOTAL) BY MOUTH DAILY.    Dispense:  90 tablet  Refill:  3  . meloxicam (MOBIC) 7.5 MG tablet    Sig: Take 1 tablet (7.5 mg total) by mouth daily as needed for pain. Take with food.    Dispense:  90 tablet    Refill:  0    Return precautions given.   Risks, benefits, and alternatives of the medications and treatment plan prescribed today were discussed, and patient expressed understanding.   Education regarding symptom management and diagnosis given to patient on AVS.  Continue to follow with Burnard Hawthorne, FNP for routine health maintenance.   Cynthia [name] and I agreed with plan.   Mable Paris, FNP

## 2020-03-16 DIAGNOSIS — Z833 Family history of diabetes mellitus: Secondary | ICD-10-CM | POA: Diagnosis not present

## 2020-03-16 DIAGNOSIS — Z7984 Long term (current) use of oral hypoglycemic drugs: Secondary | ICD-10-CM | POA: Diagnosis not present

## 2020-03-16 DIAGNOSIS — Z8349 Family history of other endocrine, nutritional and metabolic diseases: Secondary | ICD-10-CM | POA: Diagnosis not present

## 2020-03-16 DIAGNOSIS — Z87891 Personal history of nicotine dependence: Secondary | ICD-10-CM | POA: Diagnosis not present

## 2020-03-16 DIAGNOSIS — I10 Essential (primary) hypertension: Secondary | ICD-10-CM | POA: Diagnosis not present

## 2020-03-16 DIAGNOSIS — Z803 Family history of malignant neoplasm of breast: Secondary | ICD-10-CM | POA: Diagnosis not present

## 2020-03-16 DIAGNOSIS — M1990 Unspecified osteoarthritis, unspecified site: Secondary | ICD-10-CM | POA: Diagnosis not present

## 2020-03-16 DIAGNOSIS — K760 Fatty (change of) liver, not elsewhere classified: Secondary | ICD-10-CM | POA: Diagnosis not present

## 2020-03-16 DIAGNOSIS — Z8249 Family history of ischemic heart disease and other diseases of the circulatory system: Secondary | ICD-10-CM | POA: Diagnosis not present

## 2020-03-16 DIAGNOSIS — I2510 Atherosclerotic heart disease of native coronary artery without angina pectoris: Secondary | ICD-10-CM | POA: Diagnosis not present

## 2020-03-16 DIAGNOSIS — Z79899 Other long term (current) drug therapy: Secondary | ICD-10-CM | POA: Diagnosis not present

## 2020-03-16 DIAGNOSIS — E785 Hyperlipidemia, unspecified: Secondary | ICD-10-CM | POA: Diagnosis not present

## 2020-03-16 DIAGNOSIS — C7A8 Other malignant neuroendocrine tumors: Secondary | ICD-10-CM | POA: Diagnosis not present

## 2020-03-16 DIAGNOSIS — E119 Type 2 diabetes mellitus without complications: Secondary | ICD-10-CM | POA: Diagnosis not present

## 2020-03-16 DIAGNOSIS — N289 Disorder of kidney and ureter, unspecified: Secondary | ICD-10-CM | POA: Diagnosis not present

## 2020-03-17 ENCOUNTER — Other Ambulatory Visit: Payer: Self-pay

## 2020-03-17 DIAGNOSIS — C7A8 Other malignant neuroendocrine tumors: Secondary | ICD-10-CM

## 2020-03-17 DIAGNOSIS — Z87891 Personal history of nicotine dependence: Secondary | ICD-10-CM

## 2020-03-25 ENCOUNTER — Other Ambulatory Visit: Payer: Self-pay

## 2020-03-25 ENCOUNTER — Encounter: Payer: Self-pay | Admitting: Urology

## 2020-03-25 ENCOUNTER — Ambulatory Visit: Payer: Medicare Other | Admitting: Urology

## 2020-03-25 VITALS — BP 121/79 | HR 83 | Ht 68.5 in | Wt 243.0 lb

## 2020-03-25 DIAGNOSIS — N2889 Other specified disorders of kidney and ureter: Secondary | ICD-10-CM | POA: Diagnosis not present

## 2020-03-25 DIAGNOSIS — C642 Malignant neoplasm of left kidney, except renal pelvis: Secondary | ICD-10-CM | POA: Insufficient documentation

## 2020-03-25 NOTE — Progress Notes (Signed)
03/25/20 11:41 AM   Cynthia [name] 27-Feb-1946 [id number]  CC: Left renal mass  HPI: I saw Cynthia [name] in urology clinic today for evaluation of a 3 cm left cystic renal mass worrisome for cystic RCC.  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  The only prior abdominal imaging is a PET scan from May 2019, and in hindsight there appeared to be a 2.4 cm lesion at the location of the current left upper pole mass, which would equate to a growth rate of around 5 mm/year.  CT chest from June 2021 shows no evidence of metastatic disease.  She denies any gross hematuria, flank pain, or weight loss.  She denies any family history of kidney cancer.  She is a former smoker.    Renal function is normal with creatinine 0.83, EGFR greater than 60.  She denies any prior abdominal surgeries.   PMH: Past Medical History:  Diagnosis Date  . Arthritis   . 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   Marland Kitchen COPD (chronic obstructive pulmonary disease) ([place])   . Coronary artery disease   . Diabetes mellitus without complication ([place])   . Hyperlipidemia   . Hypertension   . Lung nodule   . Thyroid disease     Surgical History: Past Surgical History:  Procedure Laterality Date  . BREAST BIOPSY Left 11/25/2015   stereo  neg  . BREAST EXCISIONAL BIOPSY Left yrs ago   benign  . COLONOSCOPY WITH PROPOFOL N/A 04/20/2017   Procedure: COLONOSCOPY WITH PROPOFOL;  Surgeon: [name], MD;  Location: [facility] ENDOSCOPY;  Service: Endoscopy;  Laterality: N/A;  . COLONOSCOPY WITH PROPOFOL N/A 01/08/2018   Procedure: COLONOSCOPY WITH PROPOFOL;  Surgeon: [name], MD;  Location: [facility] ENDOSCOPY;   Service: Gastroenterology;  Laterality: N/A;  . COLONOSCOPY WITH PROPOFOL N/A 08/10/2018   Procedure: COLONOSCOPY WITH PROPOFOL;  Surgeon: [name], MD;  Location: [facility] ENDOSCOPY;  Service: Gastroenterology;  Laterality: N/A;  . THYROIDECTOMY     Dr. [name]  . VAGINAL DELIVERY     4    Family History: Family History  Problem Relation Age of Onset  . Hypertension Mother   . Hypertension Father   . Diabetes Sister   . Thyroid disease Sister   . Breast cancer Maternal Aunt   . Breast cancer Maternal Aunt     Social History:  reports that she quit smoking about 6 years ago. Her smoking use included cigarettes. She has a 36.00 pack-year smoking history. She has quit using smokeless tobacco.  Her smokeless tobacco use included snuff and chew. She reports that she does not drink alcohol and does not use drugs.  Physical Exam: BP 121/79   Pulse 83   Ht 5' 8.5" (1.74 m)   Wt 243 lb (110.2 kg)   BMI 36.41 kg/m    Constitutional:  Alert and oriented, No acute distress. Cardiovascular: No clubbing, cyanosis, or edema. Respiratory: Normal respiratory effort, no increased work of breathing. GI: Abdomen is soft, nontender, nondistended, no abdominal masses GU: No CVA tenderness  Laboratory Data: Reviewed, see HPI  Pertinent Imaging: I have personally reviewed the MRI dated 03/09/2020.  3.2 cm cystic upper pole medial renal  mass including a 2 cm solid component worrisome for cystic RCC, no evidence of metastatic disease on MRI or chest CT.  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  There is no evidence of metastatic disease on MRI or chest CT.  Renal function is normal, and she is asymptomatic.  We discussed treatment options at length for small renal masses.  We discussed active surveillance, cryoablation/radiofrequency  ablation, laparoscopic hand-assisted radical nephrectomy, and robotic assisted partial nephrectomy.  Regarding active surveillance, we discussed the very low, but nonzero, risk of developing metastatic disease while on surveillance, however if the mass continues to enlarge it may not be amenable to minimally invasive ablation in the future.  She is not particularly comfortable with active surveillance.  Regarding ablation, we discussed that sometimes cystic masses are less amenable to ablation, but this procedure has the least risk of complications and postoperative pain.  We also reviewed the excision with either radical nephrectomy or robotic assisted partial nephrectomy.  The masses rather endophytic, nodes close to the hilum I do think it would be amenable to a partial nephrectomy.  My partner Dr. Erlene Quan performs the robotic partial nephrectomies in our practice, and I will review the imaging further with her to confirm she feels it is amenable to a partial nephrectomy.  Finally, we discussed the need for close follow-up regardless of treatment strategy to detect any recurrence.  With the patient's age and co-morbidities, I think percutaneous ablation is an excellent option if interventional radiology feels this is feasible.  I placed a referral to them for consideration of biopsy and ablation, and their thoughts.  I will see her back in 6 weeks to review what their recommendations are and if they feel it is amenable to ablation.   Nickolas Madrid, MD 03/25/2020  [facility] [address], [place], [place] [address] [phone]

## 2020-03-25 NOTE — Patient Instructions (Signed)
Kidney Cancer  Kidney cancer is an abnormal growth of cells in one or both kidneys. The kidneys filter waste from your blood and produce urine. Kidney cancer may spread to other parts of your body. This type of cancer may also be called renal cell carcinoma. What are the causes? The cause of this condition is not always known. In some cases, abnormal changes to genes (genetic mutations) can cause cells to form cancer. What increases the risk? You may be more likely to develop kidney cancer if you:  Are over age 60. The risk increases with age.  Have a family history of kidney cancer.  Are of African-American, Native American, or Native [place] descent.  Smoke.  Are female.  Are obese.  Have high blood pressure (hypertension).  Have advanced kidney disease, especially if you need long-term dialysis.  Have certain conditions that are passed from parent to child (inherited), such as von Hippel-Lindau disease, tuberous sclerosis, or hereditary papillary renal carcinoma.  Have been exposed to certain chemicals. What are the signs or symptoms? In the early stages, kidney cancer does not cause symptoms. As the cancer grows, symptoms may include:  Blood in the urine.  Pain in the upper back or abdomen, just below the rib cage. You may feel pain on one or both sides of the body.  Fatigue.  Unexplained weight loss.  Fever. How is this diagnosed? This condition may be diagnosed based on:  Your symptoms and medical history.  A physical exam.  Blood and urine tests.  X-rays.  Imaging tests, such as CT scans, MRIs, and PET scans.  Having dye injected into your blood through an IV, and then having X-rays taken of: ? Your kidneys and the rest of the organs involved in making and storing urine (intravenous pyelogram). ? Your blood vessels (angiogram).  Removal and testing of a kidney tissue sample (biopsy). Your cancer will be assessed (staged), based on how severe it is and how  much it has spread. How is this treated? Treatment depends on the type and stage of the cancer. Treatment may include one or more of the following:  Surgery. This may include surgery to remove: ? Just the tumor (nephron-sparing surgery). ? The entire kidney (nephrectomy). ? The kidney, some of the surrounding healthy tissue, nearby lymph nodes, and the adrenal gland in certain cases (radical nephrectomy).  Medicines that kill cancer cells (chemotherapy).  High-energy rays that kill cancer cells (radiation therapy).  Targeted therapy. This targets specific parts of cancer cells and the area around them to block the growth and the spread of the cancer. Targeted therapy can help to limit the damage to healthy cells.  Medicines that help your body's disease-fighting system (immune system) fight cancer cells (immunotherapy).  Freezing cancer cells using gas or liquid that is delivered through a needle (cryoablation).  Destroying cancer cells using high-energy radio waves that are delivered through a needle-like probe (radiofrequency ablation).  A procedure to block the artery that supplies blood to the tumor, which kills the cancer cells (embolization). Follow these instructions at home: Eating and drinking  Some of your treatments might affect your appetite and your ability to chew and swallow. If you are having problems eating, or if you do not have an appetite, meet with a diet and nutrition specialist (dietitian).  If you have side effects that affect eating, it may help to: ? Eat smaller meals and snacks often. ? Drink high-nutrition and high-calorie shakes or supplements. ? Eat bland and soft   foods that are easy to eat. ? Not eat foods that are hot, spicy, or hard to swallow. Lifestyle  Do not drink alcohol.  Do not use any products that contain nicotine or tobacco, such as cigarettes and e-cigarettes. If you need help quitting, ask your health care provider. General  instructions   Take over-the-counter and prescription medicines only as told by your health care provider. This includes vitamins, supplements, and herbal products.  Consider joining a support group to help you cope with the stress of having kidney cancer.  Work with your health care provider to manage any side effects of treatment.  Keep all follow-up visits as told by your health care provider. This is important. Where to find more information  American Cancer Society: https://www.cancer.org  National Cancer Institute (NCI): https://www.cancer.gov Contact a health care provider if you:  Notice that you bruise or bleed easily.  Are losing weight without trying.  Have new or increased fatigue or weakness. Get help right away if you have:  Blood in your urine.  A sudden increase in pain.  A fever.  Shortness of breath.  Chest pain.  Yellow skin or whites of your eyes (jaundice). Summary  Kidney cancer is an abnormal growth of cells (tumor) in one or both kidneys. Tumors may spread to other parts of your body.  In the early stages, kidney cancer does not cause symptoms. As the cancer grows, symptoms may include blood in the urine, pain in the upper back or abdomen, unexplained weight loss, fatigue, and fever.  Treatment depends on the type and stage of the cancer. It may include surgery to remove the tumor, procedures and medicines to kill the cancer cells, or medicines to help your body fight cancer cells. This information is not intended to replace advice given to you by your health care provider. Make sure you discuss any questions you have with your health care provider. Document Revised: 09/27/2017 Document Reviewed: 09/24/2017 Elsevier Patient Education  2020 [employer]  

## 2020-03-26 ENCOUNTER — Ambulatory Visit: Payer: Medicare Other | Admitting: Podiatry

## 2020-03-26 ENCOUNTER — Encounter: Payer: Self-pay | Admitting: Podiatry

## 2020-03-26 ENCOUNTER — Other Ambulatory Visit: Payer: Self-pay

## 2020-03-26 DIAGNOSIS — E1140 Type 2 diabetes mellitus with diabetic neuropathy, unspecified: Secondary | ICD-10-CM | POA: Diagnosis not present

## 2020-03-26 DIAGNOSIS — B351 Tinea unguium: Secondary | ICD-10-CM | POA: Diagnosis not present

## 2020-03-26 DIAGNOSIS — M79674 Pain in right toe(s): Secondary | ICD-10-CM | POA: Diagnosis not present

## 2020-03-26 DIAGNOSIS — M79675 Pain in left toe(s): Secondary | ICD-10-CM

## 2020-03-26 NOTE — Progress Notes (Signed)

## 2020-03-29 LAB — 5 HIAA, QUANTITATIVE, URINE, 24 HOUR
5-HIAA, Ur: 2.9 mg/L
5-HIAA,Quant.,24 Hr Urine: 7.7 mg/24 hr (ref 0.0–14.9)
Total Volume: 2650

## 2020-03-31 ENCOUNTER — Other Ambulatory Visit: Payer: Self-pay | Admitting: Radiology

## 2020-03-31 DIAGNOSIS — N2889 Other specified disorders of kidney and ureter: Secondary | ICD-10-CM

## 2020-04-08 ENCOUNTER — Other Ambulatory Visit: Payer: Self-pay

## 2020-04-08 ENCOUNTER — Encounter: Payer: Self-pay | Admitting: *Deleted

## 2020-04-08 ENCOUNTER — Ambulatory Visit
Admission: RE | Admit: 2020-04-08 | Discharge: 2020-04-08 | Disposition: A | Discharge status: Home / Self Care | Payer: Medicare Other | Source: Ambulatory Visit | Attending: Urology | Admitting: Urology

## 2020-04-08 DIAGNOSIS — Z85038 Personal history of other malignant neoplasm of large intestine: Secondary | ICD-10-CM | POA: Diagnosis not present

## 2020-04-08 DIAGNOSIS — N2889 Other specified disorders of kidney and ureter: Secondary | ICD-10-CM | POA: Diagnosis not present

## 2020-04-08 HISTORY — PX: IR RADIOLOGIST EVAL & MGMT: IMG5224

## 2020-04-08 NOTE — Consult Note (Addendum)
Chief Complaint:  Patient was consulted remotely today (TeleHealth) for left renal mass at the request of Sninsky,Brian C.    Referring Physician(s): Sninsky,Brian C  History of Present Illness: Cynthia [name] is a 74 y.o. female With a history of neuroendocrine colon carcinoma and pulmonary nodules. 02/05/2018 PET/CT showed no residual/recurrent disease related to her colon carcinoma.  Small low-attenuation left renal lesion is in retrospect noted. 02/19/2020 on a follow-up CT chest study, interval increase in size of left renal mass was identified 03/02/2020 renal ultrasound confirms solitary 2.7 cm left mid renal mass 03/09/2020 MR confirms 2.1 cm complex left upper pole renal lesion, partially exophytic.  No regional invasion or adenopathy. The patient is asymptomatic from  this lesion.  No renal insufficiency.  No hematuria, flank pain, weight loss.  No history of kidney cancer.  Positive history of tobacco abuse.  Past Medical History:  Diagnosis Date  . Arthritis   . 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   Marland Kitchen COPD (chronic obstructive pulmonary disease) ([place])   . Coronary artery disease   . Diabetes mellitus without complication ([place])   . Hyperlipidemia   . Hypertension   . Lung nodule   . Thyroid disease     Past Surgical History:  Procedure Laterality Date  . BREAST BIOPSY Left 11/25/2015   stereo  neg  . BREAST EXCISIONAL BIOPSY Left yrs ago   benign  . COLONOSCOPY WITH PROPOFOL N/A 04/20/2017   Procedure: COLONOSCOPY WITH PROPOFOL;  Surgeon: [name], MD;  Location: [facility] ENDOSCOPY;  Service: Endoscopy;  Laterality: N/A;  . COLONOSCOPY WITH PROPOFOL N/A 01/08/2018   Procedure: COLONOSCOPY WITH PROPOFOL;  Surgeon: [name], MD;  Location[facility] ENDOSCOPY;  Service: Gastroenterology;  Laterality: N/A;  . COLONOSCOPY WITH PROPOFOL N/A 08/10/2018   Procedure: COLONOSCOPY WITH PROPOFOL;  Surgeon: [name], MD;  Location: [facility] ENDOSCOPY;  Service:  Gastroenterology;  Laterality: N/A;  . THYROIDECTOMY     Dr. [name]  . VAGINAL DELIVERY     4    Allergies: Patient has no known allergies.  Medications: Prior to Admission medications   Medication Sig Start Date End Date Taking? Authorizing Provider  amLODipine (NORVASC) 10 MG tablet TAKE 1 TABLET (10 MG TOTAL) BY MOUTH DAILY. 03/13/20   Burnard Hawthorne, FNP  aspirin 81 MG tablet Take 81 mg by mouth daily.    [provider]  atorvastatin (LIPITOR) 20 MG tablet TAKE 1 TABLET (20 MG TOTAL) BY MOUTH DAILY. 11/19/19   Burnard Hawthorne, FNP  Calcium Carbonate-Vit D-Min (CALCIUM 600+D PLUS MINERALS) 600-400 MG-UNIT TABS Take by mouth.    [provider]  diclofenac sodium (VOLTAREN) 1 % GEL Apply 2 g topically 4 (four) times daily.    [provider]  DULoxetine (CYMBALTA) 30 MG capsule TAKE 1 CAPSULE BY MOUTH ONCE A DAY FOR FIRST WEEK, THEN INCREASE TO 2 CAPS BY MOUTH ONCE DAILY 01/27/20   Burnard Hawthorne, FNP  glucose blood ([facility] GLUCOSE METER TEST STRIPS) test strip TEST BLOOD SUGAR 1-2 TIMES A DAY 05/25/16   [provider]  glucose blood (ONE TOUCH ULTRA TEST) test strip TEST BLOOD SUGAR 1-2 TIMES A DAY 04/17/19   Burnard Hawthorne, FNP  Lancets [facility] DELICA PLUS [id number]) [place] USE AS INSTRUCTED 02/24/20   Burnard Hawthorne, FNP  levothyroxine (SYNTHROID) 137 MCG tablet Take 1 tablet (137 mcg total) by mouth daily before breakfast. 11/19/19   Burnard Hawthorne,  FNP  losartan-hydrochlorothiazide (HYZAAR) 50-12.5 MG tablet Take 1 tablet by mouth daily. 11/19/19   Burnard Hawthorne, FNP  meloxicam (MOBIC) 7.5 MG tablet Take 1 tablet (7.5 mg total) by mouth daily as needed for pain. Take with food. 03/13/20   Burnard Hawthorne, FNP  metFORMIN (GLUCOPHAGE) 500 MG tablet Take one tablet by mouth two times a day. 11/19/19   Burnard Hawthorne, FNP  metoprolol tartrate (LOPRESSOR) 25 MG tablet Take 1 tablet (25 mg total) by mouth 2 (two) times daily. 04/17/19    Burnard Hawthorne, FNP     Family History  Problem Relation Age of Onset  . Hypertension Mother   . Hypertension Father   . Diabetes Sister   . Thyroid disease Sister   . Breast cancer Maternal Aunt   . Breast cancer Maternal Aunt     Social History   Socioeconomic History  . Marital status: Married    Spouse name: Not on file  . Number of children: 4  . Years of education: Not on file  . Highest education level: Not on file  Occupational History  . Not on file  Tobacco Use  . Smoking status: Former Smoker    Packs/day: 0.75    Years: 48.00    Pack years: 36.00    Types: Cigarettes    Quit date: 2015    Years since quitting: 6.5  . Smokeless tobacco: Former Systems developer    Types: Snuff, Chew  Vaping Use  . Vaping Use: Never used  Substance and Sexual Activity  . Alcohol use: No  . Drug use: No  . Sexual activity: Never  Other Topics Concern  . Not on file  Social History Narrative   Lives in [place] with husband. Has 4 children.      4 grandchildren.      Work - Retired from [employer]- walking the track, 4x per week      Diet- regular      Social Determinants of Radio broadcast assistant Strain:   . Difficulty of Paying Living Expenses:   Food Insecurity:   . Worried About Charity fundraiser in the Last Year:   . Arboriculturist in the Last Year:   Transportation Needs:   . Film/video editor (Medical):   Marland Kitchen Lack of Transportation (Non-Medical):   Physical Activity: Sufficiently Active  . Days of Exercise per Week: 5 days  . Minutes of Exercise per Session: 30 min  Stress: No Stress Concern Present  . Feeling of Stress : Not at all  Social Connections:   . Frequency of Communication with Friends and Family:   . Frequency of Social Gatherings with Friends and Family:   . Attends Religious Services:   . Active Member of Clubs or Organizations:   . Attends Archivist Meetings:   Marland Kitchen Marital Status:     ECOG  Status: 0 - Asymptomatic  Review of Systems  Review of Systems: A 12 point ROS discussed and pertinent positives are indicated in the HPI above.  All other systems are negative.  Physical Exam No direct physical exam was performed (except for noted visual exam findings with Video Visits).     Vital Signs: There were no vitals taken for this visit.  Imaging: MRI ABDOMEN WITHOUT AND WITH CONTRAST   TECHNIQUE: Multiplanar multisequence MR imaging of the abdomen was performed both before and after the administration of intravenous contrast.  CONTRAST:  40mL GADAVIST GADOBUTROL 1 MMOL/ML IV SOLN   COMPARISON:  U/S renal 03/02/2020   FINDINGS: Lower chest: No acute findings.   Hepatobiliary: Mild hepatic steatosis. No suspicious liver abnormality identified. The gallbladder is unremarkable. No biliary dilatation.   Pancreas:  No inflammation, mass or main duct dilatation.   Spleen:  Within normal limits in size and appearance.   Adrenals/Urinary Tract: The adrenal glands are unremarkable. Cyst arising from the medial aspect of the lower pole of right kidney measures 3.8 x 3.0 cm, image 14/2. There are thin, barely visible internal areas of septation. No internal enhancement identified on the subtraction images. No mural nodule identified. This is consistent with a Bosniak category 2 lesion.   Arising from the upper pole cortex of the left kidney is a complex cystic lesion measuring 3.2 x 3.0 by 2.8 cm. This contains an internal solid enhancing component measuring 2.1 cm, image 12/2. 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 No additional kidney lesions identified.   Stomach/Bowel: Visualized portions within the abdomen are unremarkable.   Vascular/Lymphatic: No pathologically enlarged lymph nodes identified. No abdominal aortic aneurysm demonstrated.   Other:  None.   Musculoskeletal: No suspicious bone lesions identified.    IMPRESSION: 1. Complex cystic lesion arising from the upper pole of the left kidney contains an internal solid enhancing component measuring 2.1 cm. 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 Urologic consultation is advised. 2. Bosniak category 2 lesion arising from the medial aspect of the lower pole of right kidney. 3. Mild hepatic steatosis.     Labs:  CBC: Recent Labs    09/09/19 1007 03/11/20 1028  WBC 4.6 4.5  HGB 14.4 13.9  HCT 44.9 40.4  PLT 268 273    COAGS: No results for input(s): INR, APTT in the last 8760 hours.  BMP: Recent Labs    09/09/19 1007 12/06/19 1201 03/11/20 1028  NA 136 136 138  K 3.7 3.8 4.0  CL 99 100 101  CO2 29 31 28   GLUCOSE 130* 94 124*  BUN 15 12 14   CALCIUM 9.7 9.7 9.2  CREATININE 0.71 0.70 0.83  GFRNONAA >60  --  >60  GFRAA >60  --  >60    LIVER FUNCTION TESTS: Recent Labs    09/09/19 1007 12/06/19 1201 03/11/20 1028  BILITOT 0.6 0.6 0.7  AST 26 25 28   ALT 24 24 26   ALKPHOS 63 60 59  PROT 7.4 7.1 7.3  ALBUMIN 4.2 4.1 4.1    TUMOR MARKERS: Recent Labs    09/09/19 1039  CHROMGRNA 1    Assessment and Plan:  My impression is that this patient has 3.2 cm upper pole left renal mass worrisome for renal cell carcinoma.  Its location would be quite approachable for percutaneous ablation, with curative intent.  This would be much less invasive than partial nephrectomy with shorter recovery and less nephron loss. I reviewed with the patient the imaging findings.  We discussed treatment including watchful waiting, surgical resection, and percutaneous ablation techniques.  We discussed in detail cryoablation, anticipated benefits, possible risks and side effects, post procedure recovery.  I would concurrently perform core biopsy for confirmatory tissue diagnosis. She seemed to understand and did ask appropriate questions, which were answered.  She is motivated to proceed.  She would like to discuss  this with her family as they were unaware of this work-up so far.  Her husband is battling metastatic pancreatic carcinoma. Accordingly, we  will confirm with her next week, and then set up CT-guided core biopsy and percutaneous cryoablation of her left renal lesion with anesthesia as an outpatient with extended recovery at [facility] at her convenience.  Thank you for this interesting consult.  I greatly enjoyed meeting AMEYAH BANGURA and look forward to participating in their care.  A copy of this report was sent to the requesting provider on this date.  Electronically Signed: Rickard Rhymes 04/08/2020, 1:49 PM   I spent a total of  30 Minutes   in remote  clinical consultation, greater than 50% of which was counseling/coordinating care for left renal mass.    Visit type: Audio only (telephone). Audio (no video) only due to patient's lack of internet/smartphone capability. Alternative for in-person consultation at [facility], [address], [place], [place]. This visit type was conducted due to national recommendations for restrictions regarding the COVID-19 Pandemic (e.g. social distancing).  This format is felt to be most appropriate for this patient at this time.  All issues noted in this document were discussed and addressed.

## 2020-04-13 ENCOUNTER — Telehealth: Payer: Self-pay | Admitting: Family

## 2020-04-13 ENCOUNTER — Other Ambulatory Visit: Payer: Self-pay | Admitting: Radiology

## 2020-04-13 DIAGNOSIS — N2889 Other specified disorders of kidney and ureter: Secondary | ICD-10-CM

## 2020-04-13 NOTE — Telephone Encounter (Signed)
[place] that she has spoke to [place] office and was told to contact the doctor who placed the referral to his office. Dejanay states that she will call Dr.Sninsky's office to get the procedure ordered and will let us know if she needs assistance.Marland Kitchen

## 2020-04-13 NOTE — Telephone Encounter (Signed)
Call pt I believe there is confusion She had discussed percutaneous ablation of renal mass with Dr Nickolas Madrid, UROLOGY who referred her to interventional radiology ( ? Is this Dr Vernard Gambles)  If there are orders needed, patient will need to call the below to reach Dr Diamantina Providence.   Would she like for [place] to call  Urology office? How can we help?   [place] [address], [place], [place] [address] [phone]

## 2020-04-13 NOTE — Telephone Encounter (Signed)
Pt said Dr. Vernard Gambles from [facility] said that he needs an order so they can schedule her to get the tumor on her left kidney frozen?

## 2020-04-16 ENCOUNTER — Other Ambulatory Visit (HOSPITAL_COMMUNITY): Payer: Self-pay | Admitting: Interventional Radiology

## 2020-04-16 DIAGNOSIS — N289 Disorder of kidney and ureter, unspecified: Secondary | ICD-10-CM

## 2020-04-17 ENCOUNTER — Ambulatory Visit (INDEPENDENT_AMBULATORY_CARE_PROVIDER_SITE_OTHER): Payer: Medicare Other

## 2020-04-17 VITALS — Ht 68.5 in | Wt 243.0 lb

## 2020-04-17 DIAGNOSIS — Z0000 Encounter for general adult medical examination without abnormal findings: Secondary | ICD-10-CM

## 2020-04-17 NOTE — Patient Instructions (Addendum)
Cynthia [name] , Thank you for taking time to come for your Medicare Wellness Visit. I appreciate your ongoing commitment to your health goals. Please review the following plan we discussed and let me know if I can assist you in the future.   These are the goals we discussed: Goals    . Follow up with Primary Care Provider     As needed       This is a list of the screening recommended for you and due dates:  Health Maintenance  Topic Date Due  . Flu Shot  04/19/2020  . Complete foot exam   05/23/2020  . Hemoglobin A1C  06/07/2020  . Eye exam for diabetics  09/02/2020  . Mammogram  04/17/2021  . Colon Cancer Screening  08/10/2021  . Tetanus Vaccine  05/17/2026  . DEXA scan (bone density measurement)  Completed  . COVID-19 Vaccine  Completed  .  Hepatitis C: One time screening is recommended by Center for Disease Control  (CDC) for  adults born from [birth year] through 1965.   Completed  . Pneumonia vaccines  Completed    Immunizations Immunization History  Administered Date(s) Administered  . Fluad Quad(high Dose 65+) 05/24/2019  . Influenza, High Dose Seasonal PF 07/18/2017, 07/11/2018  . Influenza,inj,Quad PF,6+ Mos 06/13/2013, 06/13/2014, 07/03/2015, 05/11/2016  . Influenza-Unspecified 07/18/2012  . PFIZER SARS-COV-2 Vaccination 12/20/2019, 01/14/2020  . Pneumococcal Conjugate-13 03/07/2014  . Pneumococcal Polysaccharide-23 03/08/2013  . Tdap 05/17/2016   Keep all routine maintenance appointments.   Follow up 06/16/20 @ 8:30  Advanced directives: decilined  Conditions/risks identified: none new  Follow up in one year for your annual wellness visit.    Preventive Care 74 Years and Older, Female Preventive care refers to lifestyle choices and visits with your health care provider that can promote health and wellness. What does preventive care include?  A yearly physical exam. This is also called an annual well check.  Dental exams once or twice a year.  Routine eye  exams. Ask your health care provider how often you should have your eyes checked.  Personal lifestyle choices, including:  Daily care of your teeth and gums.  Regular physical activity.  Eating a healthy diet.  Avoiding tobacco and drug use.  Limiting alcohol use.  Practicing safe sex.  Taking low-dose aspirin every day.  Taking vitamin and mineral supplements as recommended by your health care provider. What happens during an annual well check? The services and screenings done by your health care provider during your annual well check will depend on your age, overall health, lifestyle risk factors, and family history of disease. Counseling  Your health care provider may ask you questions about your:  Alcohol use.  Tobacco use.  Drug use.  Emotional well-being.  Home and relationship well-being.  Sexual activity.  Eating habits.  History of falls.  Memory and ability to understand (cognition).  Work and work Statistician.  Reproductive health. Screening  You may have the following tests or measurements:  Height, weight, and BMI.  Blood pressure.  Lipid and cholesterol levels. These may be checked every 5 years, or more frequently if you are over 65 years old.  Skin check.  Lung cancer screening. You may have this screening every year starting at age 14 if you have a 30-pack-year history of smoking and currently smoke or have quit within the past 15 years.  Fecal occult blood test (FOBT) of the stool. You may have this test every year starting at age 51.  Flexible sigmoidoscopy or colonoscopy. You may have a sigmoidoscopy every 5 years or a colonoscopy every 10 years starting at age 30.  Hepatitis C blood test.  Hepatitis B blood test.  Sexually transmitted disease (STD) testing.  Diabetes screening. This is done by checking your blood sugar (glucose) after you have not eaten for a while (fasting). You may have this done every 1-3 years.  Bone  density scan. This is done to screen for osteoporosis. You may have this done starting at age 16.  Mammogram. This may be done every 1-2 years. Talk to your health care provider about how often you should have regular mammograms. Talk with your health care provider about your test results, treatment options, and if necessary, the need for more tests. Vaccines  Your health care provider may recommend certain vaccines, such as:  Influenza vaccine. This is recommended every year.  Tetanus, diphtheria, and acellular pertussis (Tdap, Td) vaccine. You may need a Td booster every 10 years.  Zoster vaccine. You may need this after age 63.  Pneumococcal 13-valent conjugate (PCV13) vaccine. One dose is recommended after age 4.  Pneumococcal polysaccharide (PPSV23) vaccine. One dose is recommended after age 20. Talk to your health care provider about which screenings and vaccines you need and how often you need them. This information is not intended to replace advice given to you by your health care provider. Make sure you discuss any questions you have with your health care provider. Document Released: 10/02/2015 Document Revised: 05/25/2016 Document Reviewed: 07/07/2015 Elsevier Interactive Patient Education  2017 [place] Prevention in the Home Falls can cause injuries. They can happen to people of all ages. There are many things you can do to make your home safe and to help prevent falls. What can I do on the outside of my home?  Regularly fix the edges of walkways and driveways and fix any cracks.  Remove anything that might make you trip as you walk through a door, such as a raised step or threshold.  Trim any bushes or trees on the path to your home.  Use bright outdoor lighting.  Clear any walking paths of anything that might make someone trip, such as rocks or tools.  Regularly check to see if handrails are loose or broken. Make sure that both sides of any steps have  handrails.  Any raised decks and porches should have guardrails on the edges.  Have any leaves, snow, or ice cleared regularly.  Use sand or salt on walking paths during winter.  Clean up any spills in your garage right away. This includes oil or grease spills. What can I do in the bathroom?  Use night lights.  Install grab bars by the toilet and in the tub and shower. Do not use towel bars as grab bars.  Use non-skid mats or decals in the tub or shower.  If you need to sit down in the shower, use a plastic, non-slip stool.  Keep the floor dry. Clean up any water that spills on the floor as soon as it happens.  Remove soap buildup in the tub or shower regularly.  Attach bath mats securely with double-sided non-slip rug tape.  Do not have throw rugs and other things on the floor that can make you trip. What can I do in the bedroom?  Use night lights.  Make sure that you have a light by your bed that is easy to reach.  Do not use any sheets or blankets  that are too big for your bed. They should not hang down onto the floor.  Have a firm chair that has side arms. You can use this for support while you get dressed.  Do not have throw rugs and other things on the floor that can make you trip. What can I do in the kitchen?  Clean up any spills right away.  Avoid walking on wet floors.  Keep items that you use a lot in easy-to-reach places.  If you need to reach something above you, use a strong step stool that has a grab bar.  Keep electrical cords out of the way.  Do not use floor polish or wax that makes floors slippery. If you must use wax, use non-skid floor wax.  Do not have throw rugs and other things on the floor that can make you trip. What can I do with my stairs?  Do not leave any items on the stairs.  Make sure that there are handrails on both sides of the stairs and use them. Fix handrails that are broken or loose. Make sure that handrails are as long as  the stairways.  Check any carpeting to make sure that it is firmly attached to the stairs. Fix any carpet that is loose or worn.  Avoid having throw rugs at the top or bottom of the stairs. If you do have throw rugs, attach them to the floor with carpet tape.  Make sure that you have a light switch at the top of the stairs and the bottom of the stairs. If you do not have them, ask someone to add them for you. What else can I do to help prevent falls?  Wear shoes that:  Do not have high heels.  Have rubber bottoms.  Are comfortable and fit you well.  Are closed at the toe. Do not wear sandals.  If you use a stepladder:  Make sure that it is fully opened. Do not climb a closed stepladder.  Make sure that both sides of the stepladder are locked into place.  Ask someone to hold it for you, if possible.  Clearly mark and make sure that you can see:  Any grab bars or handrails.  First and last steps.  Where the edge of each step is.  Use tools that help you move around (mobility aids) if they are needed. These include:  Canes.  Walkers.  Scooters.  Crutches.  Turn on the lights when you go into a dark area. Replace any light bulbs as soon as they burn out.  Set up your furniture so you have a clear path. Avoid moving your furniture around.  If any of your floors are uneven, fix them.  If there are any pets around you, be aware of where they are.  Review your medicines with your doctor. Some medicines can make you feel dizzy. This can increase your chance of falling. Ask your doctor what other things that you can do to help prevent falls. This information is not intended to replace advice given to you by your health care provider. Make sure you discuss any questions you have with your health care provider. Document Released: 07/02/2009 Document Revised: 02/11/2016 Document Reviewed: 10/10/2014 Elsevier Interactive Patient Education  2017 [employer].

## 2020-04-17 NOTE — Progress Notes (Addendum)
Subjective:   Cynthia [name] is a 74 y.o. female who presents for Medicare Annual (Subsequent) preventive examination.  Review of Systems    No ROS.  Medicare Wellness Virtual Visit.    Cardiac Risk Factors include: advanced age (>40men, >80 women);diabetes mellitus;hypertension     Objective:    Today's Vitals   04/17/20 0931  Weight: (!) 243 lb (110.2 kg)  Height: 5' 8.5" (1.74 m)   Body mass index is 36.41 kg/m.  Advanced Directives 04/17/2020 03/11/2020 11/25/2019 09/09/2019 04/17/2019 03/12/2019 08/20/2018  Does Patient Have a Medical Advance Directive? No No No No No No No  Would patient like information on creating a medical advance directive? No - Patient declined No - Patient declined No - Patient declined - No - Patient declined - -    Current Medications (verified) Outpatient Encounter Medications as of 04/17/2020  Medication Sig  . amLODipine (NORVASC) 10 MG tablet TAKE 1 TABLET (10 MG TOTAL) BY MOUTH DAILY.  Marland Kitchen aspirin EC 81 MG tablet Take 81 mg by mouth daily. Swallow whole.  Marland Kitchen atorvastatin (LIPITOR) 20 MG tablet TAKE 1 TABLET (20 MG TOTAL) BY MOUTH DAILY. (Patient taking differently: Take 20 mg by mouth daily. )  . Calcium Carbonate-Vit D-Min (CALCIUM 600+D PLUS MINERALS) 600-400 MG-UNIT TABS Take 1 tablet by mouth daily.   . diclofenac sodium (VOLTAREN) 1 % GEL Apply 1 application topically 4 (four) times daily as needed (knee pain.).   Marland Kitchen DULoxetine (CYMBALTA) 30 MG capsule TAKE 1 CAPSULE BY MOUTH ONCE A DAY FOR FIRST WEEK, THEN INCREASE TO 2 CAPS BY MOUTH ONCE DAILY (Patient taking differently: Take 60 mg by mouth daily. )  . glucose blood ([employer]S GLUCOSE METER TEST STRIPS) test strip TEST BLOOD SUGAR 1-2 TIMES A DAY  . glucose blood (ONE TOUCH ULTRA TEST) test strip TEST BLOOD SUGAR 1-2 TIMES A DAY  . Lancets (ONETOUCH DELICA PLUS [id number]) MISC USE AS INSTRUCTED  . levothyroxine (SYNTHROID) 137 MCG tablet Take 1 tablet (137 mcg total) by mouth daily before  breakfast. (Patient taking differently: Take 137 mcg by mouth daily at 6 (six) AM. )  . losartan-hydrochlorothiazide (HYZAAR) 50-12.5 MG tablet Take 1 tablet by mouth daily.  . meloxicam (MOBIC) 7.5 MG tablet Take 1 tablet (7.5 mg total) by mouth daily as needed for pain. Take with food.  . metFORMIN (GLUCOPHAGE) 500 MG tablet Take one tablet by mouth two times a day. (Patient taking differently: Take 500 mg by mouth in the morning and at bedtime. )  . metoprolol tartrate (LOPRESSOR) 25 MG tablet Take 1 tablet (25 mg total) by mouth 2 (two) times daily.   No facility-administered encounter medications on file as of 04/17/2020.    Allergies (verified) Patient has no known allergies.   History: Past Medical History:  Diagnosis Date  . Arthritis   . 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   Marland Kitchen COPD (chronic obstructive pulmonary disease) ([place])   . Coronary artery disease   . Diabetes mellitus without complication ([place])   . Hyperlipidemia   . Hypertension   . Lung nodule   . Thyroid disease    Past Surgical History:  Procedure Laterality Date  . BREAST BIOPSY Left 11/25/2015   stereo  neg  . BREAST EXCISIONAL BIOPSY Left yrs ago   benign  . COLONOSCOPY WITH PROPOFOL N/A 04/20/2017   Procedure: COLONOSCOPY WITH PROPOFOL;  Surgeon: [name], MD;  Location: [facility] ENDOSCOPY;  Service: Endoscopy;  Laterality: N/A;  .  COLONOSCOPY WITH PROPOFOL N/A 01/08/2018   Procedure: COLONOSCOPY WITH PROPOFOL;  Surgeon: [name], MD;  Location: [facility] ENDOSCOPY;  Service: Gastroenterology;  Laterality: N/A;  . COLONOSCOPY WITH PROPOFOL N/A 08/10/2018   Procedure: COLONOSCOPY WITH PROPOFOL;  Surgeon: [name], MD;  Location[facility] ENDOSCOPY;  Service: Gastroenterology;  Laterality: N/A;  . IR RADIOLOGIST EVAL & MGMT  04/08/2020  . THYROIDECTOMY     Dr. [name]  . VAGINAL DELIVERY     4   Family History  Problem Relation Age of Onset  . Hypertension Mother   . Hypertension Father   .  Diabetes Sister   . Thyroid disease Sister   . Breast cancer Maternal Aunt   . Breast cancer Maternal Aunt    Social History   Socioeconomic History  . Marital status: Married    Spouse name: Not on file  . Number of children: 4  . Years of education: Not on file  . Highest education level: Not on file  Occupational History  . Not on file  Tobacco Use  . Smoking status: Former Smoker    Packs/day: 0.75    Years: 48.00    Pack years: 36.00    Types: Cigarettes    Quit date: 2015    Years since quitting: 6.5  . Smokeless tobacco: Former Systems developer    Types: Snuff, Chew  Vaping Use  . Vaping Use: Never used  Substance and Sexual Activity  . Alcohol use: No  . Drug use: No  . Sexual activity: Never  Other Topics Concern  . Not on file  Social History Narrative   Lives in [place] with husband. Has 4 children.      4 grandchildren.      Work - Retired from [employer]- walking the track, 4x per week      Diet- regular      Social Determinants of Radio broadcast assistant Strain: Low Risk   . Difficulty of Paying Living Expenses: Not hard at all  Food Insecurity: No Food Insecurity  . Worried About Charity fundraiser in the Last Year: Never true  . Ran Out of Food in the Last Year: Never true  Transportation Needs: No Transportation Needs  . Lack of Transportation (Medical): No  . Lack of Transportation (Non-Medical): No  Physical Activity: Sufficiently Active  . Days of Exercise per Week: 5 days  . Minutes of Exercise per Session: 30 min  Stress: No Stress Concern Present  . Feeling of Stress : Not at all  Social Connections: Socially Integrated  . Frequency of Communication with Friends and Family: More than three times a week  . Frequency of Social Gatherings with Friends and Family: Once a week  . Attends Religious Services: More than 4 times per year  . Active Member of Clubs or Organizations: Yes  . Attends Archivist Meetings:  More than 4 times per year  . Marital Status: Married    Tobacco Counseling Counseling given: Not Answered   Clinical Intake:  Pre-visit preparation completed: Yes        Diabetes: Yes (Followed by pcp)  How often do you need to have someone help you when you read instructions, pamphlets, or other written materials from your doctor or pharmacy?: 1 - Never  Interpreter Needed?: No      Activities of Daily Living In your present state of health, do you have any difficulty performing the following activities: 04/17/2020  Hearing? N  Vision? N  Difficulty concentrating or making decisions? N  Walking or climbing stairs? N  Dressing or bathing? N  Doing errands, shopping? N  Preparing Food and eating ? N  Using the Toilet? N  In the past six months, have you accidently leaked urine? N  Do you have problems with loss of bowel control? N  Managing your Medications? N  Managing your Finances? N  Housekeeping or managing your Housekeeping? N  Some recent data might be hidden    Patient Care Team: Burnard Hawthorne, FNP as PCP - General (Family Medicine) Jackolyn Confer, MD (Internal Medicine) Clent Jacks, RN as Registered Nurse  Indicate any recent Medical Services you may have received from other than [facility] providers in the past year (date may be approximate).     Assessment:   This is a routine wellness examination for Cynthia [name].  I connected with Cynthia [name] today by telephone and verified that I am speaking with the correct person using two identifiers. Location patient: home Location provider: work Persons participating in the virtual visit: patient, Marine scientist.    I discussed the limitations, risks, security and privacy concerns of performing an evaluation and management service by telephone and the availability of in person appointments. The patient expressed understanding and verbally consented to this telephonic visit.    Interactive audio and video  telecommunications were attempted between this provider and patient, however failed, due to patient having technical difficulties OR patient did not have access to video capability.  We continued and completed visit with audio only.  Some vital signs may be absent or patient reported.   Hearing/Vision screen  Hearing Screening   125Hz  250Hz  500Hz  1000Hz  2000Hz  3000Hz  4000Hz  6000Hz  8000Hz   Right ear:           Left ear:           Comments: Patient is able to hear conversational tones without difficulty.  No issues reported.  Vision Screening Comments: Wears corrective lenses No retinopathy reported. Visual acuity not assessed, virtual visit.  They have seen their ophthalmologist in the last 12 months.    Dietary issues and exercise activities discussed: Current Exercise Habits: Home exercise routine, Type of exercise: walking, Time (Minutes): 30, Frequency (Times/Week): 5, Weekly Exercise (Minutes/Week): 150, Intensity: Mild  Modified carb diet Good water intake Caffeine- 2 cups of coffee  Goals    . Follow up with Primary Care Provider     As needed      Depression Screen PHQ 2/9 Scores 04/17/2020 12/06/2019 07/05/2019 05/24/2019 04/17/2019 04/09/2018 03/13/2018  PHQ - 2 Score 0 0 0 0 0 0 0  PHQ- 9 Score - 0 - 0 - - -    Fall Risk Fall Risk  04/17/2020 03/13/2020 07/05/2019 05/24/2019 04/17/2019  Falls in the past year? - 1 1 0 0  Number falls in past yr: - 0 0 - -  Injury with Fall? - 0 0 - -  Comment - - - - -  Follow up Falls evaluation completed Falls evaluation completed Falls evaluation completed Falls evaluation completed -    Handrails in use when climbing stairs? Yes  Home free of loose throw rugs in walkways, pet beds, electrical cords, etc? Yes  Adequate lighting in your home to reduce risk of falls? Yes   ASSISTIVE DEVICES UTILIZED TO PREVENT FALLS: Life alert? No  Use of a cane, [place] or w/c? No  Grab bars in the bathroom? No  Shower chair or  bench in shower? No    Elevated toilet seat or a handicapped toilet? Yes   TIMED UP AND GO:  Was the test performed? No . Virtual visit.   Cognitive Function: Patient is alert and oriented x3.  Enjoys reading her Bible.  Manages her own finances. Denies difficulty focusing, concentrating, making decisions and memory loss.   MMSE - Mini Mental State Exam 04/07/2017 04/07/2016 04/02/2015  Orientation to time 5 5 5   Orientation to Place 5 5 5   Registration 3 3 3   Attention/ Calculation 2 5 5   Attention/Calculation-comments Difficulty performing simple calculation - -  Recall 3 3 3   Language- name 2 objects 2 2 2   Language- repeat 1 1 1   Language- follow 3 step command 3 3 3   Language- read & follow direction 1 1 1   Write a sentence 1 1 1   Copy design 1 1 1   Total score 27 30 30      6CIT Screen 04/17/2020 04/17/2019 04/09/2018  What Year? 0 points 0 points 0 points  What month? 0 points 0 points 0 points  What time? 0 points 0 points 0 points  Count back from 20 - 0 points 0 points  Months in reverse - 0 points 2 points  Repeat phrase - 0 points -  Total Score - 0 -    Immunizations Immunization History  Administered Date(s) Administered  . Fluad Quad(high Dose 65+) 05/24/2019  . Influenza, High Dose Seasonal PF 07/18/2017, 07/11/2018  . Influenza,inj,Quad PF,6+ Mos 06/13/2013, 06/13/2014, 07/03/2015, 05/11/2016  . Influenza-Unspecified 07/18/2012  . PFIZER SARS-COV-2 Vaccination 12/20/2019, 01/14/2020  . Pneumococcal Conjugate-13 03/07/2014  . Pneumococcal Polysaccharide-23 03/08/2013  . Tdap 05/17/2016    Health Maintenance There are no preventive care reminders to display for this patient. Health Maintenance  Topic Date Due  . INFLUENZA VACCINE  04/19/2020  . FOOT EXAM  05/23/2020  . HEMOGLOBIN A1C  06/07/2020  . OPHTHALMOLOGY EXAM  09/02/2020  . MAMMOGRAM  04/17/2021  . COLONOSCOPY  08/10/2021  . TETANUS/TDAP  05/17/2026  . DEXA SCAN  Completed  . COVID-19 Vaccine  Completed  .  Hepatitis C Screening  Completed  . PNA vac Low Risk Adult  Completed    Dental Screening: Recommended annual dental exams for proper oral hygiene. Dentures.  Community Resource Referral / Chronic Care Management: CRR required this visit?  No   CCM required this visit?  No      Plan:   Keep all routine maintenance appointments.   Follow up 06/16/20 @ 8:30  I have personally reviewed and noted the following in the patient's chart:   . Medical and social history . Use of alcohol, tobacco or illicit drugs  . Current medications and supplements . Functional ability and status . Nutritional status . Physical activity . Advanced directives . List of other physicians . Hospitalizations, surgeries, and ER visits in previous 12 months . Vitals . Screenings to include cognitive, depression, and falls . Referrals and appointments  In addition, I have reviewed and discussed with patient certain preventive protocols, quality metrics, and best practice recommendations. A written personalized care plan for preventive services as well as general preventive health recommendations were provided to patient via mychart.     Varney Biles, LPN   4/49/7530    Agree with plan. Mable Paris, NP

## 2020-04-21 ENCOUNTER — Ambulatory Visit
Admission: RE | Admit: 2020-04-21 | Discharge: 2020-04-21 | Disposition: A | Discharge status: Home / Self Care | Payer: Medicare Other | Source: Ambulatory Visit | Attending: Family | Admitting: Family

## 2020-04-21 ENCOUNTER — Other Ambulatory Visit: Payer: Self-pay

## 2020-04-21 DIAGNOSIS — E039 Hypothyroidism, unspecified: Secondary | ICD-10-CM | POA: Diagnosis not present

## 2020-04-21 DIAGNOSIS — J449 Chronic obstructive pulmonary disease, unspecified: Secondary | ICD-10-CM | POA: Diagnosis not present

## 2020-04-21 DIAGNOSIS — Z1231 Encounter for screening mammogram for malignant neoplasm of breast: Secondary | ICD-10-CM | POA: Insufficient documentation

## 2020-04-21 DIAGNOSIS — Z780 Asymptomatic menopausal state: Secondary | ICD-10-CM | POA: Insufficient documentation

## 2020-04-21 DIAGNOSIS — Z13820 Encounter for screening for osteoporosis: Secondary | ICD-10-CM | POA: Insufficient documentation

## 2020-04-21 DIAGNOSIS — E119 Type 2 diabetes mellitus without complications: Secondary | ICD-10-CM | POA: Diagnosis not present

## 2020-04-21 IMAGING — MG DIGITAL SCREENING BILAT W/ TOMO W/ CAD
8 series · 8 of 24 positions shown · non-contrast
Comparison: Previous exam(s).

CLINICAL DATA: Screening.

EXAM:
DIGITAL SCREENING BILATERAL MAMMOGRAM WITH TOMO AND CAD

[R MLO synth-2D]
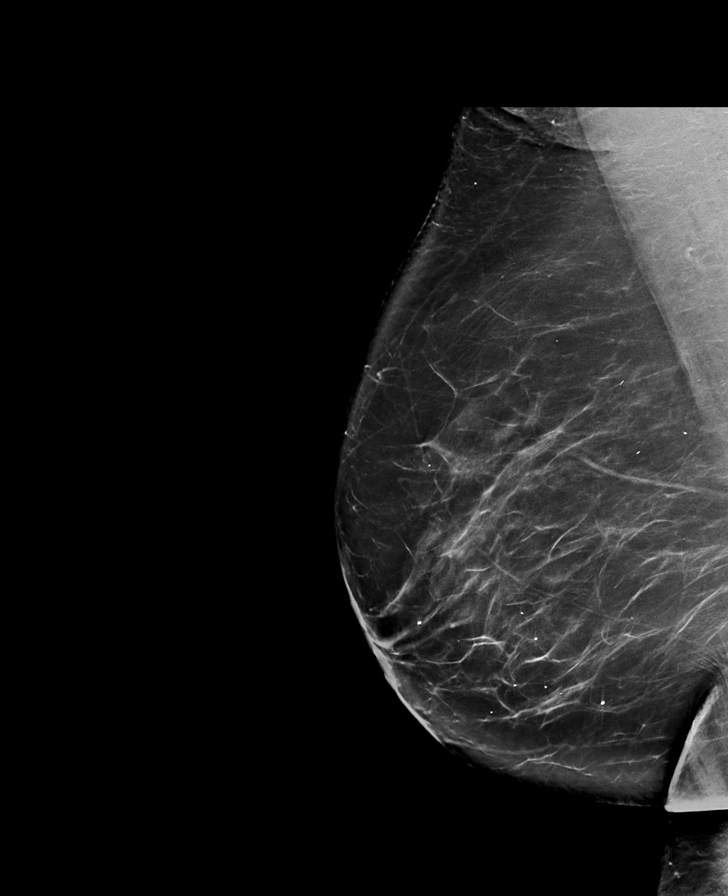

[L CC synth-2D]
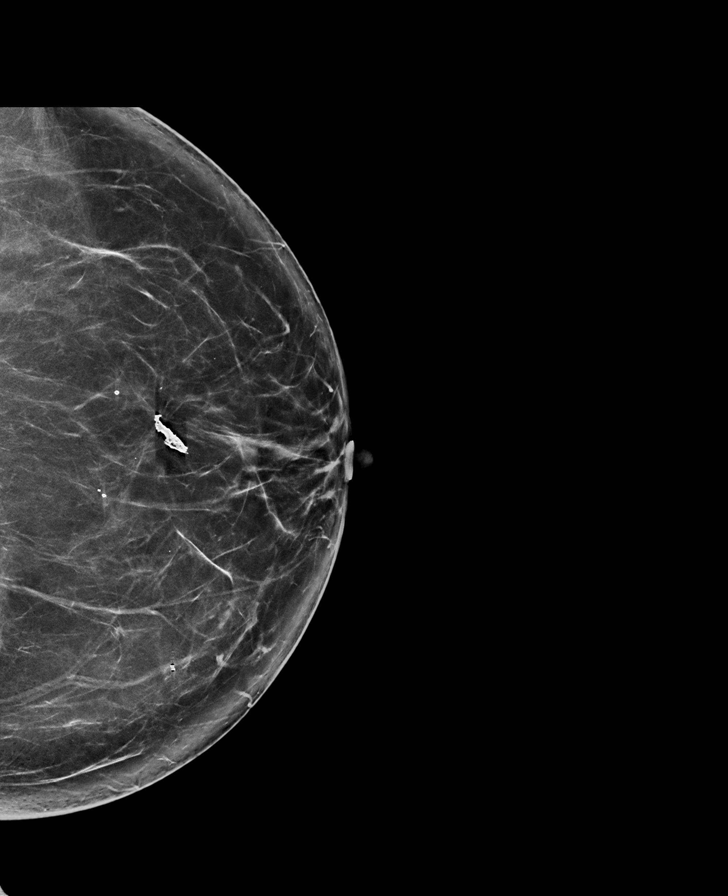

[R CC synth-2D]
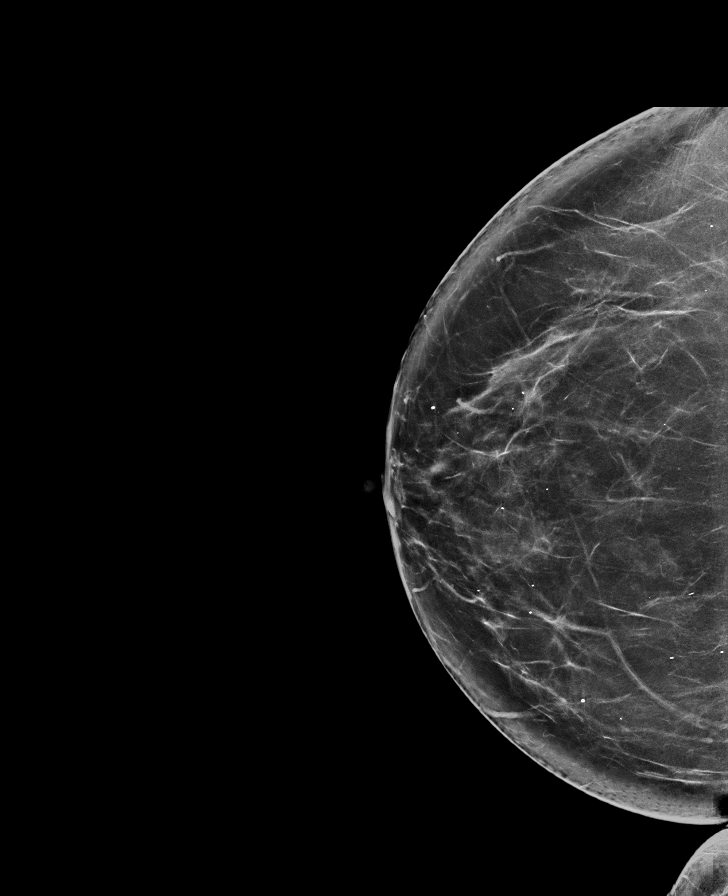

[L MLO synth-2D]
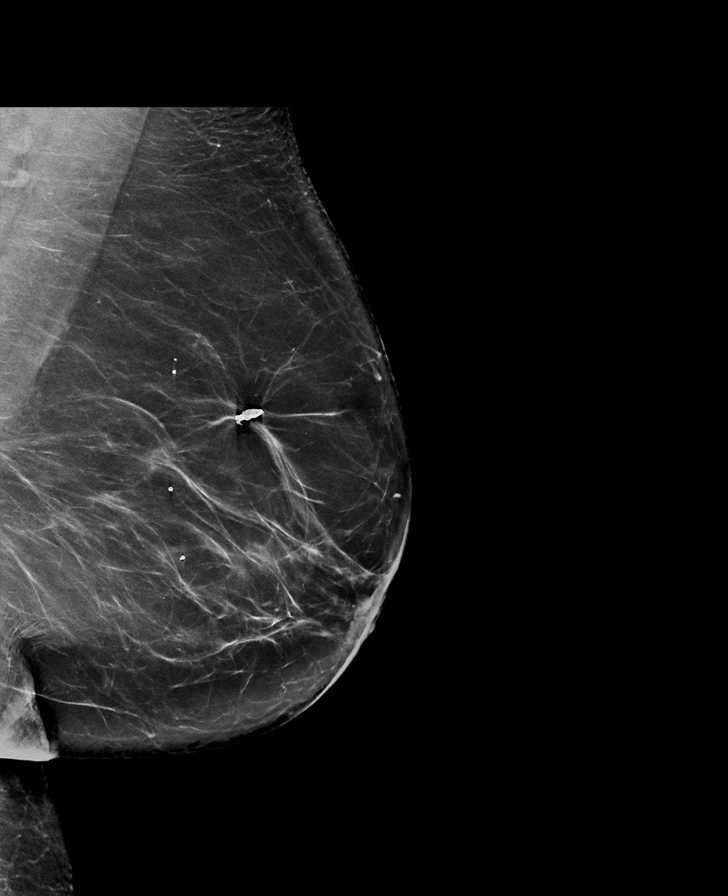

[L CC tomo · tomo slice 42/83.0]
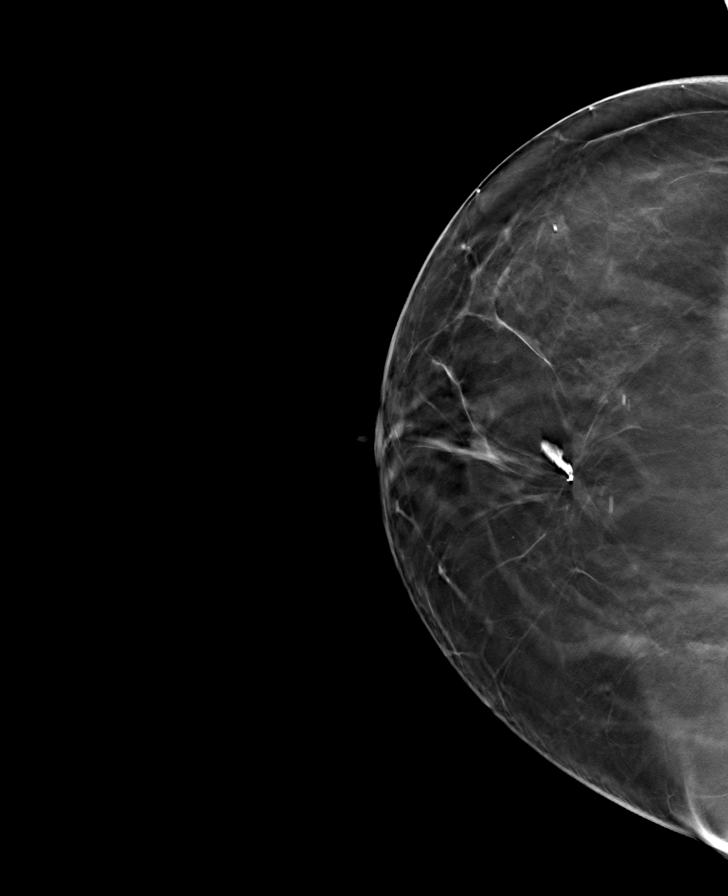

[L MLO tomo · tomo slice 49/97.0]
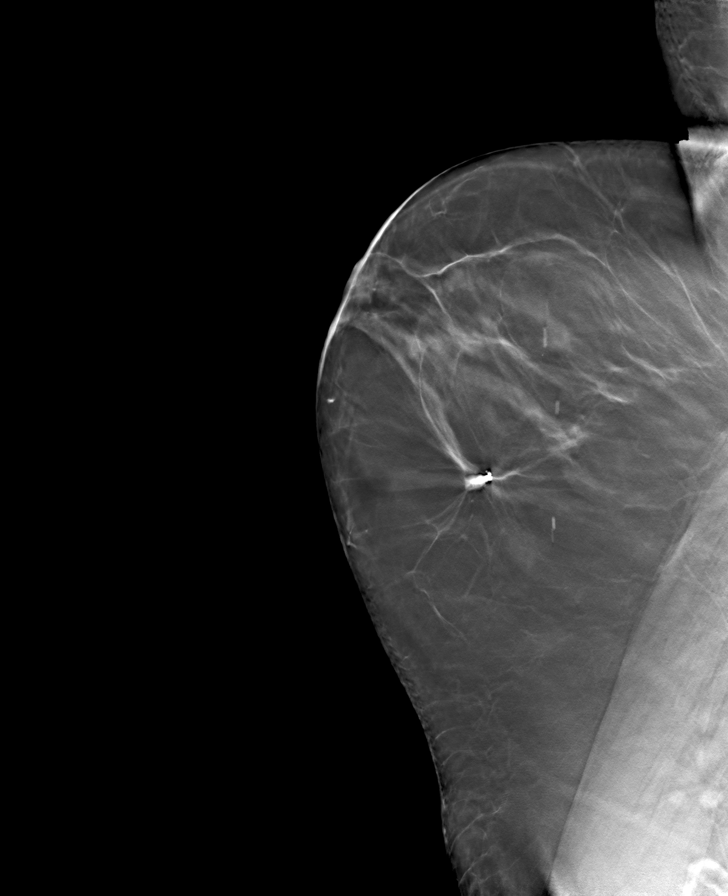

[R MLO tomo · tomo slice 49/98.0]
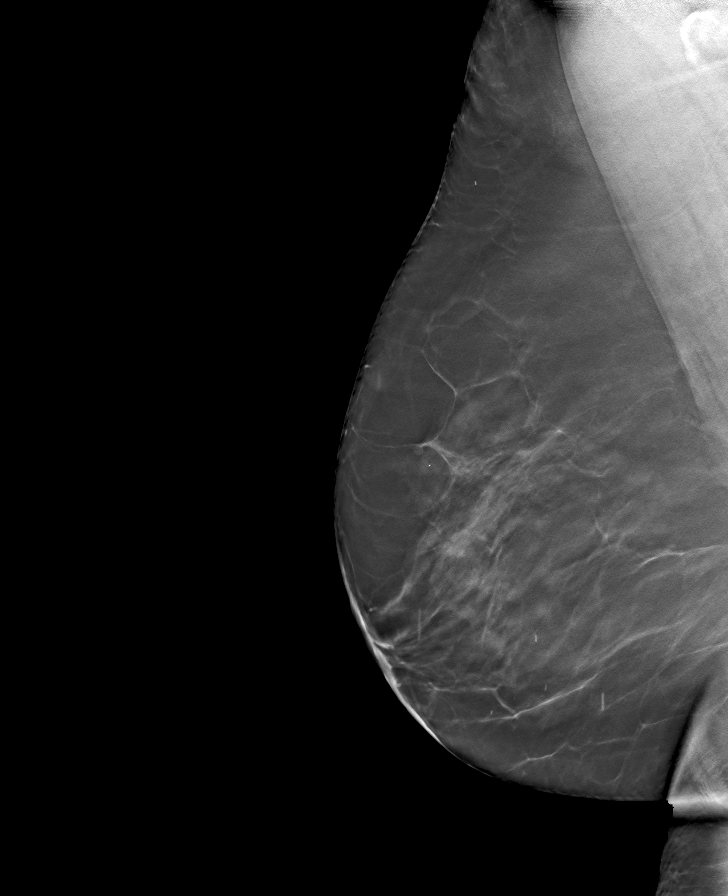

[R CC tomo · tomo slice 43/84.0]
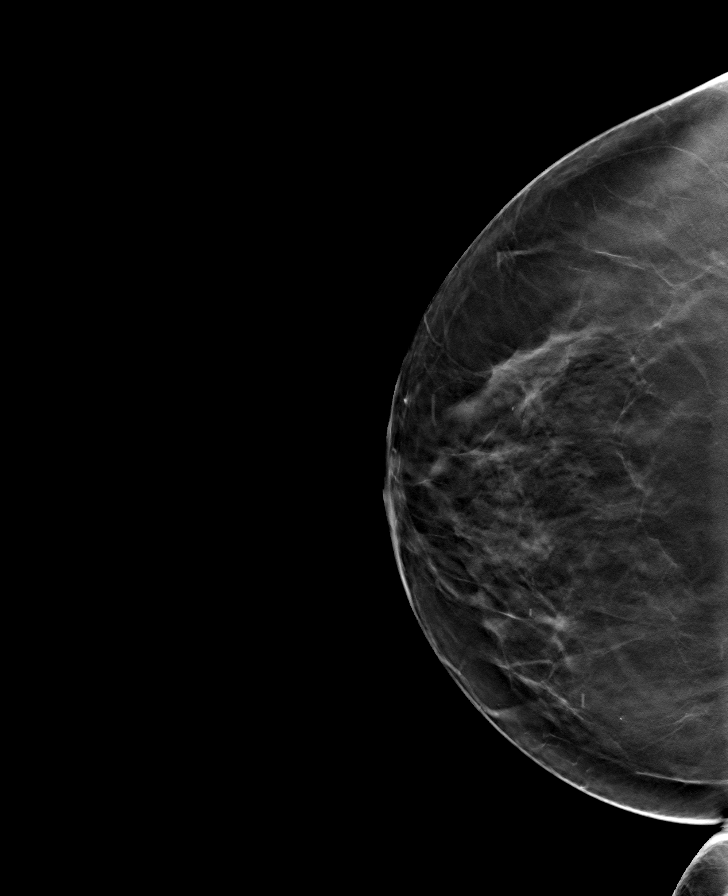

[8 of 24 positions shown; findings below may reference images not displayed]

ACR Breast Density Category b: There are scattered areas of
fibroglandular density.
FINDINGS: There are no findings suspicious for malignancy. Images were
processed with CAD.
IMPRESSION: No mammographic evidence of malignancy. A result letter of this
screening mammogram will be mailed directly to the patient.

RECOMMENDATION:
Screening mammogram in one year. (Code:[TQ])

BI-RADS CATEGORY  1: Negative.

## 2020-04-23 NOTE — Progress Notes (Addendum)
PCP - Dr. Onnie Boer [place]  Cardiologist - Dr. Doree Barthel 11-13-19 Care everywhere  PPM/ICD -  Device Orders -  Rep Notified -   Chest x-ray -  EKG -  Stress Test - 2018 careverywhere ECHO - 2018 Cardiac Cath -   Sleep Study -  CPAP -   Fasting Blood Sugar -  Checks Blood Sugar _____ times a day  Blood Thinner Instructions: Aspirin Instructions:  ERAS Protcol - PRE-SURGERY Ensure or G2-   COVID TEST- 8-7  Activity - Walks to mailbox, and does own housewok and walks 30 minutes 4 days a week without sob Anesthesia review: CAD, COPD, DM, HTN  Patient denies shortness of breath, fever, cough and chest pain at PAT appointment   All instructions explained to the patient, with a verbal understanding of the material. Patient agrees to go over the instructions while at home for a better understanding. Patient also instructed to self quarantine after being tested for COVID-19. The opportunity to ask questions was provided.

## 2020-04-23 NOTE — Patient Instructions (Addendum)
DUE TO COVID-19 ONLY ONE VISITOR IS ALLOWED TO COME WITH YOU AND STAY IN THE WAITING ROOM ONLY DURING PRE OP AND PROCEDURE DAY OF SURGERY.  THE  ONE  VISITOR  MAY VISIT WITH YOU AFTER SURGERY IN YOUR PRIVATE ROOM DURING VISITING HOURS ONLY!  YOU NEED TO HAVE A COVID 19 TEST ON__8-7-21_____ @_______ , THIS TEST MUST BE DONE BEFORE SURGERY,  COVID TESTING SITE [address] [place] [address], IT IS ON THE RIGHT GOING OUT [address] APPROXIMATELY  2 MINUTES PAST [employer] ON THE RIGHT. ONCE YOUR COVID TEST IS COMPLETED,  PLEASE BEGIN THE QUARANTINE INSTRUCTIONS AS OUTLINED IN YOUR HANDOUT.                Cynthia [name]  04/23/2020   Your procedure is scheduled on: 04-29-20   Report to [facility] Main  Entrance   Report to admitting at    [place]  AM     Call this number if you have problems the morning of surgery [phone]    Remember: Do not eat food or drink liquids :After Midnight.  BRUSH YOUR TEETH MORNING OF SURGERY AND RINSE YOUR MOUTH OUT, NO CHEWING GUM CANDY OR MINTS.     Take these medicines the morning of surgery with A SIP OF WATER: amlodipine, lipitor, cymbalta, levothyroxine, metoprolol  DO NOT TAKE ANY DIABETIC MEDICATIONS DAY OF YOUR SURGERY                               You may not have any metal on your body including hair pins and              piercings  Do not wear jewelry, make-up, lotions, powders or perfumes, deodorant             Do not wear nail polish on your fingernails.  Do not shave  48 hours prior to surgery.            Do not bring valuables to the hospital. [place].  Contacts, dentures or bridgework may not be worn into surgery.      Patients discharged the day of surgery will not be allowed to drive home. IF YOU ARE HAVING SURGERY AND GOING HOME THE SAME DAY, YOU MUST HAVE AN ADULT TO DRIVE YOU HOME AND BE WITH YOU FOR 24 HOURS. YOU MAY GO HOME BY TAXI OR UBER OR  ORTHERWISE, BUT AN ADULT MUST ACCOMPANY YOU HOME AND STAY WITH YOU FOR 24 HOURS.  Name and phone number of your driver:  Special Instructions: N/A              Please read over the following fact sheets you were given: _____________________________________________________________________             [facility] - Preparing for Surgery Before surgery, you can play an important role.  Because skin is not sterile, your skin needs to be as free of germs as possible.  You can reduce the number of germs on your skin by washing with CHG (chlorahexidine gluconate) soap before surgery.  CHG is an antiseptic cleaner which kills germs and bonds with the skin to continue killing germs even after washing. Please DO NOT use if you have an allergy to CHG or antibacterial soaps.  If your skin becomes  reddened/irritated stop using the CHG and inform your nurse when you arrive at Short Stay. Do not shave (including legs and underarms) for at least 48 hours prior to the first CHG shower.  You may shave your face/neck. Please follow these instructions carefully:  1.  Shower with CHG Soap the night before surgery and the  morning of Surgery.  2.  If you choose to wash your hair, wash your hair first as usual with your  normal  shampoo.  3.  After you shampoo, rinse your hair and body thoroughly to remove the  shampoo.                           4.  Use CHG as you would any other liquid soap.  You can apply chg directly  to the skin and wash                       Gently with a scrungie or clean washcloth.  5.  Apply the CHG Soap to your body ONLY FROM THE NECK DOWN.   Do not use on face/ open                           Wound or open sores. Avoid contact with eyes, ears mouth and genitals (private parts).                       Wash face,  Genitals (private parts) with your normal soap.             6.  Wash thoroughly, paying special attention to the area where your surgery  will be performed.  7.  Thoroughly rinse  your body with warm water from the neck down.  8.  DO NOT shower/wash with your normal soap after using and rinsing off  the CHG Soap.                9.  Pat yourself dry with a clean towel.            10.  Wear clean pajamas.            11.  Place clean sheets on your bed the night of your first shower and do not  sleep with pets. Day of Surgery : Do not apply any lotions/deodorants the morning of surgery.  Please wear clean clothes to the hospital/surgery center.  FAILURE TO FOLLOW THESE INSTRUCTIONS MAY RESULT IN THE CANCELLATION OF YOUR SURGERY PATIENT SIGNATURE_________________________________  NURSE SIGNATURE__________________________________  ________________________________________________________________________

## 2020-04-24 ENCOUNTER — Other Ambulatory Visit: Payer: Self-pay | Admitting: Radiology

## 2020-04-24 ENCOUNTER — Telehealth: Payer: Self-pay | Admitting: Family

## 2020-04-24 ENCOUNTER — Other Ambulatory Visit: Payer: Self-pay | Admitting: Student

## 2020-04-24 NOTE — Telephone Encounter (Signed)
Pt called to get results.  °

## 2020-04-25 ENCOUNTER — Other Ambulatory Visit (HOSPITAL_COMMUNITY)
Admission: RE | Admit: 2020-04-25 | Discharge: 2020-04-25 | Disposition: A | Discharge status: Home / Self Care | Payer: Medicare Other | Source: Ambulatory Visit | Attending: Interventional Radiology | Admitting: Interventional Radiology

## 2020-04-25 DIAGNOSIS — Z20822 Contact with and (suspected) exposure to covid-19: Secondary | ICD-10-CM | POA: Insufficient documentation

## 2020-04-25 DIAGNOSIS — Z01812 Encounter for preprocedural laboratory examination: Secondary | ICD-10-CM | POA: Diagnosis not present

## 2020-04-25 LAB — SARS CORONAVIRUS 2 (TAT 6-24 HRS): SARS Coronavirus 2: NEGATIVE

## 2020-04-27 ENCOUNTER — Other Ambulatory Visit: Payer: Self-pay | Admitting: Radiology

## 2020-04-27 ENCOUNTER — Encounter (HOSPITAL_COMMUNITY): Payer: Self-pay

## 2020-04-27 ENCOUNTER — Encounter (HOSPITAL_COMMUNITY)
Admission: RE | Admit: 2020-04-27 | Discharge: 2020-04-27 | Disposition: A | Discharge status: Home / Self Care | Payer: Medicare Other | Source: Ambulatory Visit | Attending: Interventional Radiology | Admitting: Interventional Radiology

## 2020-04-27 ENCOUNTER — Other Ambulatory Visit: Payer: Self-pay

## 2020-04-27 DIAGNOSIS — Z01818 Encounter for other preprocedural examination: Secondary | ICD-10-CM | POA: Insufficient documentation

## 2020-04-27 HISTORY — DX: Hypothyroidism, unspecified: E03.9

## 2020-04-27 HISTORY — DX: Gastro-esophageal reflux disease without esophagitis: K21.9

## 2020-04-27 LAB — PROTIME-INR
INR: 1 (ref 0.8–1.2)
Prothrombin Time: 12.4 seconds (ref 11.4–15.2)

## 2020-04-27 LAB — BASIC METABOLIC PANEL
Anion gap: 9 (ref 5–15)
BUN: 12 mg/dL (ref 8–23)
CO2: 27 mmol/L (ref 22–32)
Calcium: 9.9 mg/dL (ref 8.9–10.3)
Chloride: 102 mmol/L (ref 98–111)
Creatinine, Ser: 0.62 mg/dL (ref 0.44–1.00)
GFR calc Af Amer: >60 mL/min (ref >60)
GFR calc non Af Amer: >60 mL/min (ref >60)
Glucose, Bld: 82 mg/dL (ref 70–99)
Potassium: 4 mmol/L (ref 3.5–5.1)
Sodium: 138 mmol/L (ref 135–145)

## 2020-04-27 LAB — CBC WITH DIFFERENTIAL/PLATELET
Abs Immature Granulocytes: 0.01 10*3/uL (ref 0.00–0.07)
Basophils Absolute: 0 10*3/uL (ref 0.0–0.1)
Basophils Relative: 1 %
Eosinophils Absolute: 0.4 10*3/uL (ref 0.0–0.5)
Eosinophils Relative: 8 %
HCT: 42.9 % (ref 36.0–46.0)
Hemoglobin: 14.2 g/dL (ref 12.0–15.0)
Immature Granulocytes: 0 %
Lymphocytes Relative: 35 %
Lymphs Abs: 1.7 10*3/uL (ref 0.7–4.0)
MCH: 31.6 pg (ref 26.0–34.0)
MCHC: 33.1 g/dL (ref 30.0–36.0)
MCV: 95.5 fL (ref 80.0–100.0)
Monocytes Absolute: 0.9 10*3/uL (ref 0.1–1.0)
Monocytes Relative: 19 %
Neutro Abs: 1.8 10*3/uL (ref 1.7–7.7)
Neutrophils Relative %: 37 %
Platelets: 257 10*3/uL (ref 150–400)
RBC: 4.49 MIL/uL (ref 3.87–5.11)
RDW: 13.1 % (ref 11.5–15.5)
WBC: 4.8 10*3/uL (ref 4.0–10.5)
nRBC: 0 % (ref 0.0–0.2)

## 2020-04-27 LAB — HEMOGLOBIN A1C
Hgb A1c MFr Bld: 6.8 % — ABNORMAL HIGH (ref 4.8–5.6)
Mean Plasma Glucose: 148.46 mg/dL

## 2020-04-27 LAB — GLUCOSE, CAPILLARY: Glucose-Capillary: 86 mg/dL (ref 70–99)

## 2020-04-27 LAB — APTT: aPTT: 26 seconds (ref 24–36)

## 2020-04-28 ENCOUNTER — Other Ambulatory Visit: Payer: Self-pay | Admitting: Radiology

## 2020-04-28 NOTE — Progress Notes (Signed)
Anesthesia Chart Review   Case: [id number] Date/Time: 04/29/20 0830   Procedure: CRYOABLATION (N/A )   Anesthesia type: General   Pre-op diagnosis: LEFT RENAL LESION   [facility]   Surgeons: Arne Cleveland, MD      DISCUSSION:74 y.o. former smoker (36 pack years, quit 09/19/13) with h/o HTN, HLD, COPD, GERD, DM II, CAD, left renal lesion scheduled for above procedure 04/29/2020 with Dr. Arne Cleveland.    Normal stress test 06/30/2014.   Last seen by cardiology 11/13/2019.  Stable at this visit with 1 year follow up recommended.    COPD stable per PAT nurse interview.  Anticipate pt can proceed with planned procedure barring acute status change.   VS: BP 125/78   Pulse 68   Temp 37.3 C (Oral)   Resp 18   Ht 5\' 9"  (1.753 m)   Wt 109.3 kg   SpO2 99%   BMI 35.60 kg/m   PROVIDERS: Burnard Hawthorne, FNP is PCP last seen 03/13/2020, stable at this visit   Lujean Amel, MD is Cardiologist  LABS: Labs reviewed: Acceptable for surgery. (all labs ordered are listed, but only abnormal results are displayed)  Labs Reviewed  HEMOGLOBIN A1C - Abnormal; Notable for the following components:      Result Value   Hgb A1c MFr Bld 6.8 (*)    All other components within normal limits  APTT  PROTIME-INR  BASIC METABOLIC PANEL  CBC WITH DIFFERENTIAL/PLATELET  GLUCOSE, CAPILLARY  TYPE AND SCREEN     IMAGES:   EKG: 04/27/2020 Rate 70 bpm Sinus rhythm with PACs  CV: Myocardial Perfusion 06/30/2014 Summary 1. No significant wall motion abnormality noted.  2. Overall, this is a Low risk scan.  3. Pharmacological myocardial perfusion study with no significant ischemia 4. The estimated ejection fraction is 73%.  5. The left ventricular global function was normal.  6. There are no EKG changes concerning for ischemia.  7. There is no artifact noted on this study.  Past Medical History:  Diagnosis Date  . Arthritis   . 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 COPD (chronic obstructive pulmonary disease) ([place])   . Coronary artery disease    pt. denies  . Diabetes mellitus without complication ([place])    type 2  . GERD (gastroesophageal reflux disease)   . Hyperlipidemia   . Hypertension   . Hypothyroidism   . Lung nodule   . Thyroid disease     Past Surgical History:  Procedure Laterality Date  . BREAST BIOPSY Left 11/25/2015   stereo  neg  . BREAST EXCISIONAL BIOPSY Left yrs ago   benign  . COLONOSCOPY WITH PROPOFOL N/A 04/20/2017   Procedure: COLONOSCOPY WITH PROPOFOL;  Surgeon: [name], MD;  Location: [facility] ENDOSCOPY;  Service: Endoscopy;  Laterality: N/A;  . COLONOSCOPY WITH PROPOFOL N/A 01/08/2018   Procedure: COLONOSCOPY WITH PROPOFOL;  Surgeon: [name], MD;  Location: [facility] ENDOSCOPY;  Service: Gastroenterology;  Laterality: N/A;  . COLONOSCOPY WITH PROPOFOL N/A 08/10/2018   Procedure: COLONOSCOPY WITH PROPOFOL;  Surgeon: [name], MD;  Location: [facility] ENDOSCOPY;  Service: Gastroenterology;  Laterality: N/A;  . IR RADIOLOGIST EVAL & MGMT  04/08/2020  . THYROIDECTOMY     Dr. [name]  . VAGINAL DELIVERY     4    MEDICATIONS: . amLODipine (NORVASC) 10 MG tablet  . aspirin EC 81 MG tablet  . atorvastatin (LIPITOR) 20 MG tablet  .  Calcium Carbonate-Vit D-Min (CALCIUM 600+D PLUS MINERALS) 600-400 MG-UNIT TABS  . diclofenac sodium (VOLTAREN) 1 % GEL  . DULoxetine (CYMBALTA) 30 MG capsule  . glucose blood (CVS GLUCOSE METER TEST STRIPS) test strip  . glucose blood (ONE TOUCH ULTRA TEST) test strip  . Lancets (ONETOUCH DELICA PLUS [id number]) MISC  . levothyroxine (SYNTHROID) 137 MCG tablet  . losartan-hydrochlorothiazide (HYZAAR) 50-12.5 MG tablet  . meloxicam (MOBIC) 7.5 MG tablet  . metFORMIN (GLUCOPHAGE) 500 MG tablet  . metoprolol tartrate (LOPRESSOR) 25 MG tablet   No current facility-administered medications for this encounter.    Konrad Felix, PA-C [facility] Pre-Surgical Testing [phone]

## 2020-04-29 ENCOUNTER — Encounter (HOSPITAL_COMMUNITY)
Admission: RE | Disposition: A | Discharge status: Home / Self Care | Payer: Self-pay | Source: Other Acute Inpatient Hospital | Attending: Interventional Radiology

## 2020-04-29 ENCOUNTER — Ambulatory Visit (HOSPITAL_COMMUNITY): Payer: Medicare Other | Admitting: Certified Registered"

## 2020-04-29 ENCOUNTER — Encounter (HOSPITAL_COMMUNITY): Payer: Self-pay | Admitting: Interventional Radiology

## 2020-04-29 ENCOUNTER — Ambulatory Visit (HOSPITAL_COMMUNITY)
Admission: RE | Admit: 2020-04-29 | Discharge: 2020-04-30 | Disposition: A | Discharge status: Home / Self Care | Payer: Medicare Other | Source: Other Acute Inpatient Hospital | Attending: Interventional Radiology | Admitting: Interventional Radiology

## 2020-04-29 ENCOUNTER — Other Ambulatory Visit: Payer: Self-pay

## 2020-04-29 ENCOUNTER — Encounter (HOSPITAL_COMMUNITY): Payer: Self-pay

## 2020-04-29 ENCOUNTER — Ambulatory Visit (HOSPITAL_COMMUNITY)
Admission: RE | Admit: 2020-04-29 | Discharge: 2020-04-29 | Disposition: A | Discharge status: Home / Self Care | Payer: Medicare Other | Source: Ambulatory Visit | Attending: Interventional Radiology | Admitting: Interventional Radiology

## 2020-04-29 ENCOUNTER — Ambulatory Visit (HOSPITAL_COMMUNITY): Payer: Medicare Other | Admitting: Physician Assistant

## 2020-04-29 DIAGNOSIS — E890 Postprocedural hypothyroidism: Secondary | ICD-10-CM | POA: Diagnosis not present

## 2020-04-29 DIAGNOSIS — M1990 Unspecified osteoarthritis, unspecified site: Secondary | ICD-10-CM | POA: Insufficient documentation

## 2020-04-29 DIAGNOSIS — I2510 Atherosclerotic heart disease of native coronary artery without angina pectoris: Secondary | ICD-10-CM | POA: Diagnosis not present

## 2020-04-29 DIAGNOSIS — Z8349 Family history of other endocrine, nutritional and metabolic diseases: Secondary | ICD-10-CM | POA: Diagnosis not present

## 2020-04-29 DIAGNOSIS — J449 Chronic obstructive pulmonary disease, unspecified: Secondary | ICD-10-CM | POA: Diagnosis not present

## 2020-04-29 DIAGNOSIS — Z7984 Long term (current) use of oral hypoglycemic drugs: Secondary | ICD-10-CM | POA: Diagnosis not present

## 2020-04-29 DIAGNOSIS — Z7982 Long term (current) use of aspirin: Secondary | ICD-10-CM | POA: Insufficient documentation

## 2020-04-29 DIAGNOSIS — Z79890 Hormone replacement therapy (postmenopausal): Secondary | ICD-10-CM | POA: Diagnosis not present

## 2020-04-29 DIAGNOSIS — E785 Hyperlipidemia, unspecified: Secondary | ICD-10-CM | POA: Insufficient documentation

## 2020-04-29 DIAGNOSIS — C642 Malignant neoplasm of left kidney, except renal pelvis: Secondary | ICD-10-CM | POA: Diagnosis present

## 2020-04-29 DIAGNOSIS — E119 Type 2 diabetes mellitus without complications: Secondary | ICD-10-CM | POA: Diagnosis not present

## 2020-04-29 DIAGNOSIS — Z8249 Family history of ischemic heart disease and other diseases of the circulatory system: Secondary | ICD-10-CM | POA: Insufficient documentation

## 2020-04-29 DIAGNOSIS — Z87891 Personal history of nicotine dependence: Secondary | ICD-10-CM | POA: Insufficient documentation

## 2020-04-29 DIAGNOSIS — I10 Essential (primary) hypertension: Secondary | ICD-10-CM | POA: Insufficient documentation

## 2020-04-29 DIAGNOSIS — E039 Hypothyroidism, unspecified: Secondary | ICD-10-CM | POA: Diagnosis not present

## 2020-04-29 DIAGNOSIS — Z833 Family history of diabetes mellitus: Secondary | ICD-10-CM | POA: Diagnosis not present

## 2020-04-29 DIAGNOSIS — Z79899 Other long term (current) drug therapy: Secondary | ICD-10-CM | POA: Insufficient documentation

## 2020-04-29 DIAGNOSIS — C649 Malignant neoplasm of unspecified kidney, except renal pelvis: Secondary | ICD-10-CM | POA: Insufficient documentation

## 2020-04-29 DIAGNOSIS — N2889 Other specified disorders of kidney and ureter: Secondary | ICD-10-CM

## 2020-04-29 DIAGNOSIS — N289 Disorder of kidney and ureter, unspecified: Secondary | ICD-10-CM

## 2020-04-29 HISTORY — PX: RADIOLOGY WITH ANESTHESIA: SHX6223

## 2020-04-29 LAB — TYPE AND SCREEN
ABO/RH(D): O POS
Antibody Screen: NEGATIVE

## 2020-04-29 LAB — GLUCOSE, CAPILLARY
Glucose-Capillary: 120 mg/dL — ABNORMAL HIGH (ref 70–99)
Glucose-Capillary: 131 mg/dL — ABNORMAL HIGH (ref 70–99)

## 2020-04-29 LAB — ABO/RH: ABO/RH(D): O POS

## 2020-04-29 IMAGING — CT CT BIOPSY
1 of 8 series · 9 of 32 positions shown, 15 images · non-contrast
Comparison: none

INDICATION: Left renal complex cystic mass, partially exophytic, concerning for
renal cell carcinoma

[Series 2: i-spiral 5.0 bf37 · axial · 0.98mm/px · z∈[+1215,+1439]mm · 9 of 81 slices shown, 15 images]
[im 9/81  soft-tissue]
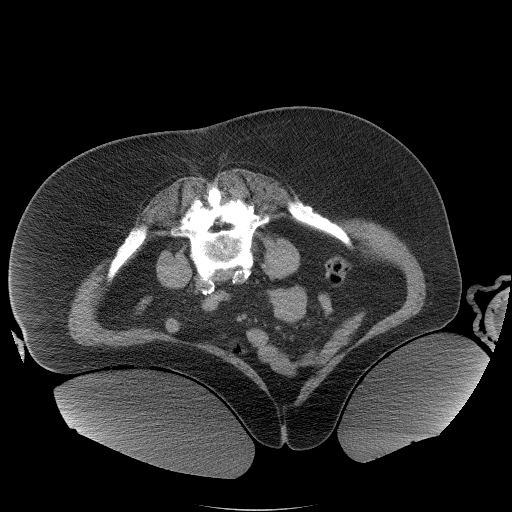
[im 9/81  bone]
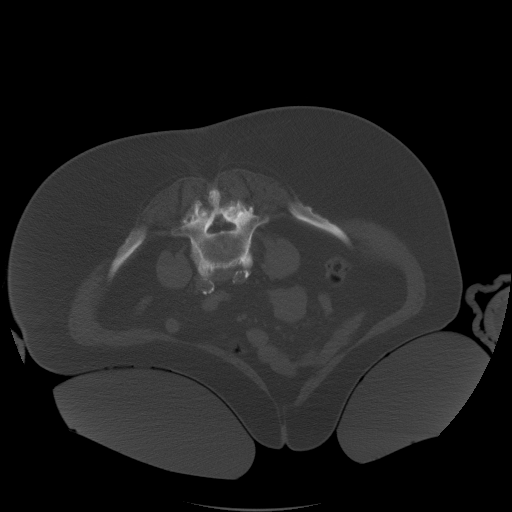
[im 17/81  soft-tissue]
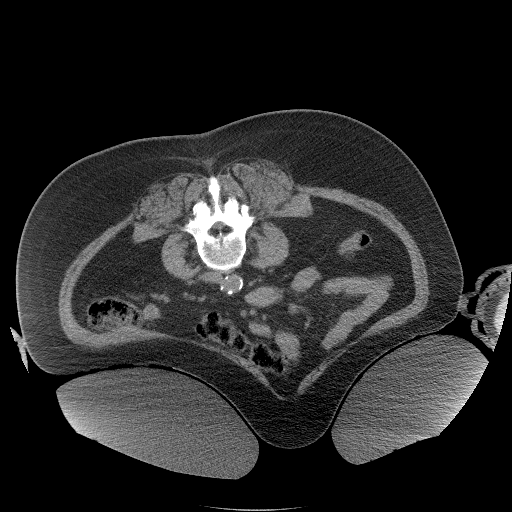
[im 25/81  soft-tissue]
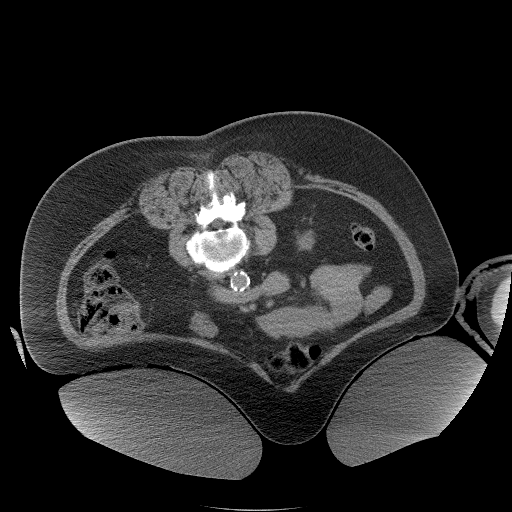
[im 33/81  soft-tissue]
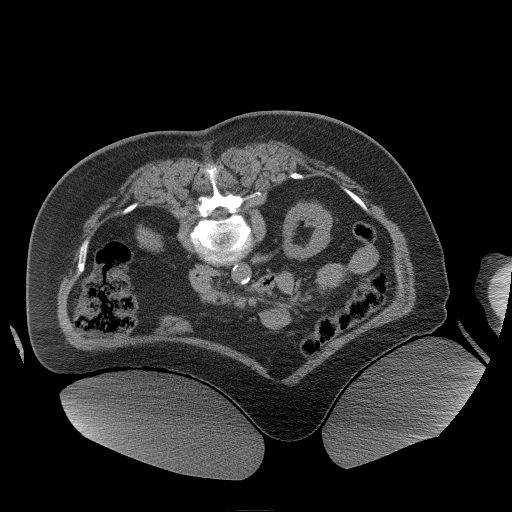
[im 41/81  soft-tissue]
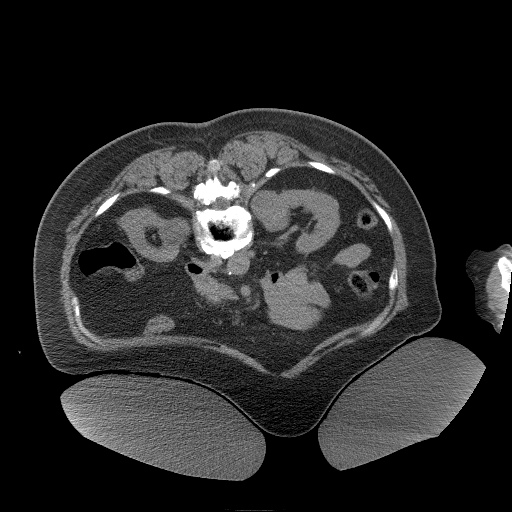
[im 49/81  soft-tissue]
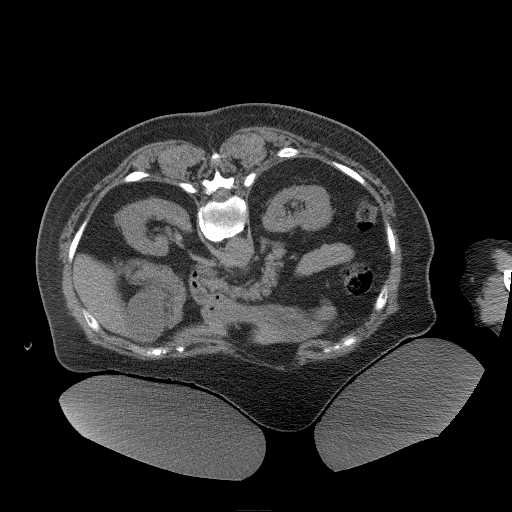
[im 49/81  lung]
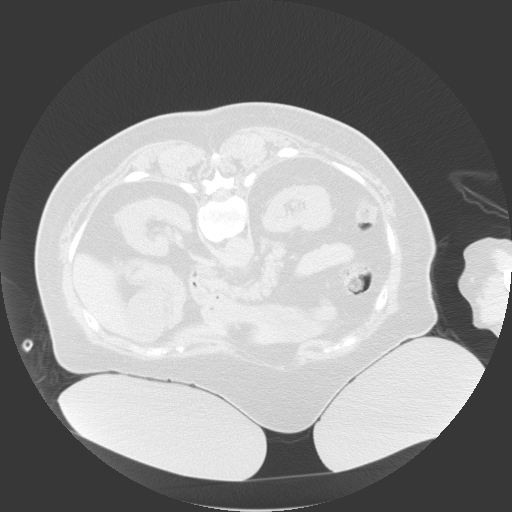
[im 57/81  soft-tissue]
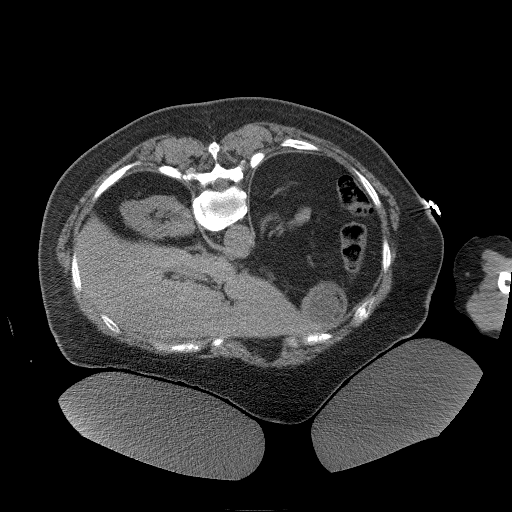
[im 57/81  lung]
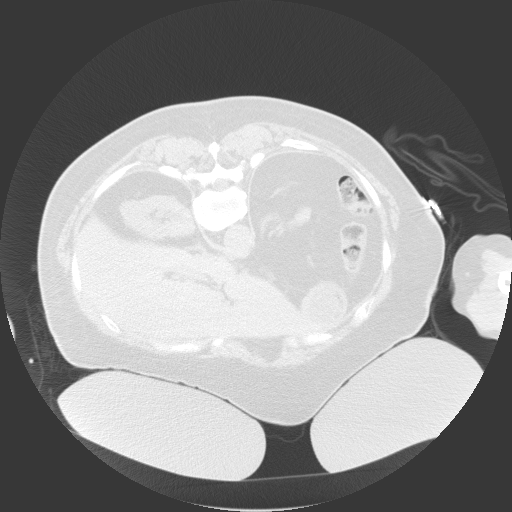
[im 65/81  soft-tissue]
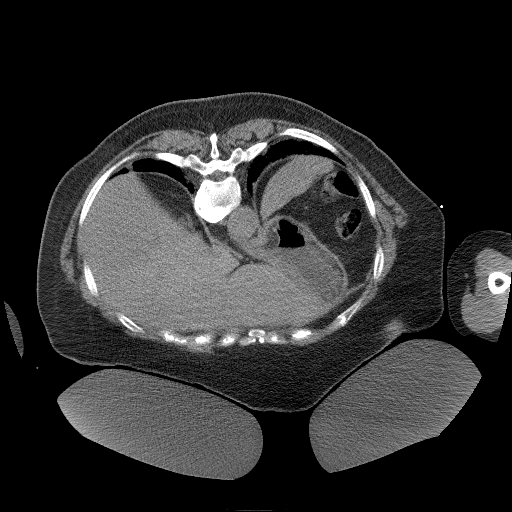
[im 65/81  lung]
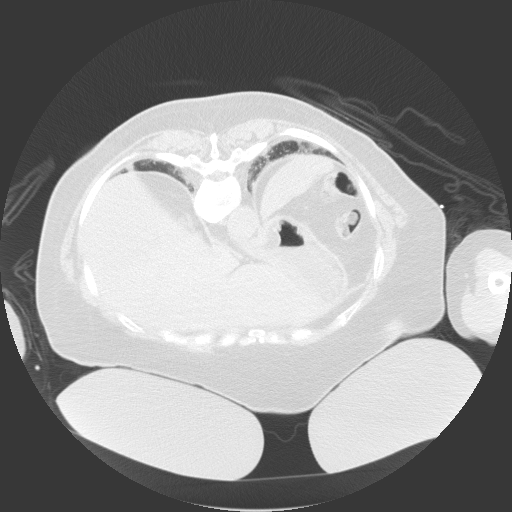
[im 73/81  soft-tissue]
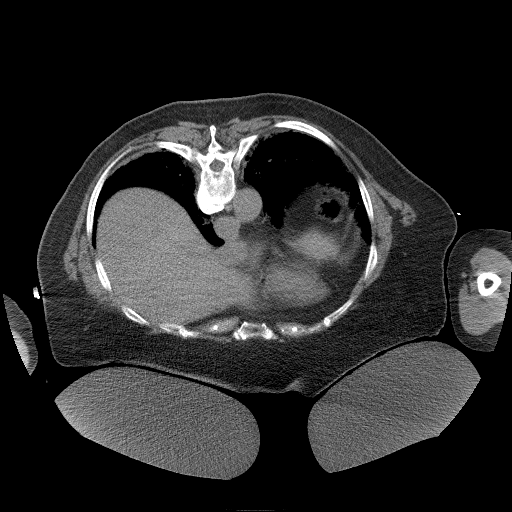
[im 73/81  lung]
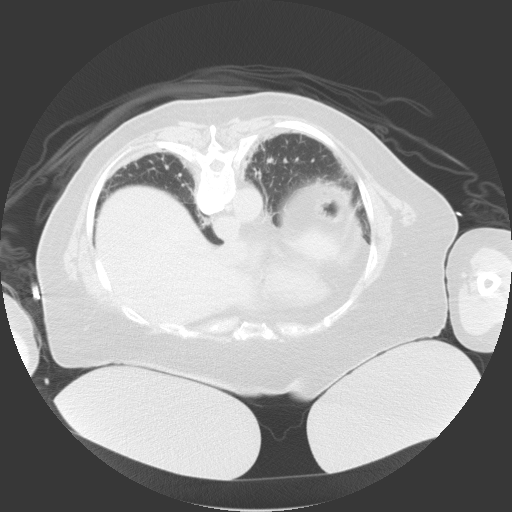
[im 73/81  bone]
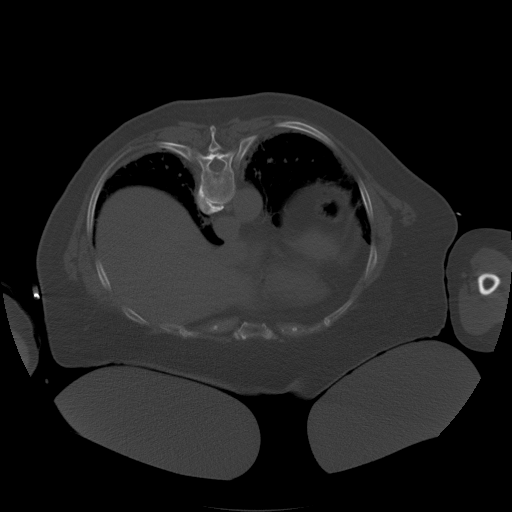

[9 of 32 positions shown; findings below may reference images not displayed]

EXAM:
CT-GUIDED PERCUTANEOUS CRYOABLATION OF LEFT RENAL MASS

CT GUIDED CORE BIOPSY LEFT RENAL MASS

ANESTHESIA/SEDATION:
General

MEDICATIONS:
Cefazolin 2 g. The antibiotic was administered in an appropriate
time interval prior to needle puncture of the skin.

CONTRAST:  None required

PROCEDURE:
The procedure, risks, benefits, and alternatives were explained to
the patient. Questions regarding the procedure were encouraged and
answered. The patient understands and consents to the procedure.

The patient was placed under general anesthesia. Initial unenhanced
CT was performed in a prone position to localize the partially
exophytic cystic left renal mass.

The patient was prepped with chlorhexidine in a sterile fashion, and
a sterile drape was applied covering the operative field. A sterile
gown and sterile gloves were used for the procedure.

Under CT guidance, 3 IceRod percutaneous cryoablation probes
advanced sequentially into the left renal mass. Probe positioning
was confirmed by CT prior to cryoablation. Subsequently, 17 gauge
trocar needle was advanced to the margin of the lesion medial aspect
and coaxial 18 gauge core biopsy samples were obtained. The guide
needle was removed.

Cryoablation was performed through the 3 probes. Initial 10 minute
cycle of cryoablation was performed. CT demonstrated good ice ball
placement. This was followed by a 8 minute thaw cycle. A second 10
minute cycle of cryoablation was then performed. During ablation,
periodic CT imaging was performed to monitor ice ball formation and
morphology. After active thaw, the cryoablation probes were removed.

Post-procedural CT was performed.

COMPLICATIONS:
None immediate.
FINDINGS: Partially exophytic complex cystic left renal lesion was localized.
No significant change in morphology since previous MR. Cryoablation
positioned, core biopsy samples obtained, and cryoablation of the
lesion performed without immediate complication. Small amount of
intralesional and regional retroperitoneal hemorrhage was identified
postprocedure. Patient remained hemodynamically stable.
IMPRESSION: 1. Technically successful CT guided percutaneous cryoablation of
left renal mass.
2. CT-guided core biopsy, left renal mass.

PLAN:
The patient will be observed overnight. Initial follow-up will be
performed in approximately 4 weeks.

## 2020-04-29 SURGERY — MRI WITH ANESTHESIA
Anesthesia: General

## 2020-04-29 MED ORDER — HYDROCODONE-ACETAMINOPHEN 5-325 MG PO TABS
1.0000 | ORAL_TABLET | ORAL | Status: DC | PRN
  Administered 2020-04-29 (×2): 2 via ORAL
  Filled 2020-04-29 (×2): qty 2

## 2020-04-29 MED ORDER — SODIUM CHLORIDE 0.9 % IV SOLN
INTRAVENOUS | Status: AC
  Filled 2020-04-29: qty 250

## 2020-04-29 MED ORDER — ONDANSETRON HCL 4 MG/2ML IJ SOLN
INTRAMUSCULAR | Status: DC | PRN
  Administered 2020-04-29: 4 mg via INTRAVENOUS

## 2020-04-29 MED ORDER — FENTANYL CITRATE (PF) 100 MCG/2ML IJ SOLN: 25.0000 ug | INTRAMUSCULAR | Status: DC | PRN

## 2020-04-29 MED ORDER — ALBUMIN HUMAN 5 % IV SOLN
INTRAVENOUS | Status: AC
  Filled 2020-04-29: qty 250

## 2020-04-29 MED ORDER — MIDAZOLAM HCL 2 MG/2ML IJ SOLN
INTRAMUSCULAR | Status: AC
  Filled 2020-04-29: qty 2

## 2020-04-29 MED ORDER — ROCURONIUM BROMIDE 10 MG/ML (PF) SYRINGE
PREFILLED_SYRINGE | INTRAVENOUS | Status: DC | PRN
  Administered 2020-04-29: 50 mg via INTRAVENOUS
  Administered 2020-04-29: 20 mg via INTRAVENOUS

## 2020-04-29 MED ORDER — ORAL CARE MOUTH RINSE: 15.0000 mL | Freq: Once | OROMUCOSAL | Status: AC

## 2020-04-29 MED ORDER — CEFAZOLIN SODIUM-DEXTROSE 2-4 GM/100ML-% IV SOLN
2.0000 g | INTRAVENOUS | Status: AC
  Administered 2020-04-29: 2 g via INTRAVENOUS
  Filled 2020-04-29: qty 100

## 2020-04-29 MED ORDER — OXYCODONE HCL 5 MG/5ML PO SOLN: 5.0000 mg | Freq: Once | ORAL | Status: DC | PRN

## 2020-04-29 MED ORDER — MIDAZOLAM HCL 2 MG/2ML IJ SOLN
INTRAMUSCULAR | Status: DC | PRN
  Administered 2020-04-29: 2 mg via INTRAVENOUS

## 2020-04-29 MED ORDER — LIDOCAINE 2% (20 MG/ML) 5 ML SYRINGE
INTRAMUSCULAR | Status: DC | PRN
  Administered 2020-04-29: 50 mg via INTRAVENOUS

## 2020-04-29 MED ORDER — SUGAMMADEX SODIUM 200 MG/2ML IV SOLN
INTRAVENOUS | Status: DC | PRN
  Administered 2020-04-29: 220 mg via INTRAVENOUS

## 2020-04-29 MED ORDER — ONDANSETRON HCL 4 MG/2ML IJ SOLN: 4.0000 mg | Freq: Four times a day (QID) | INTRAMUSCULAR | Status: DC | PRN

## 2020-04-29 MED ORDER — AMISULPRIDE (ANTIEMETIC) 5 MG/2ML IV SOLN: 10.0000 mg | Freq: Once | INTRAVENOUS | Status: DC | PRN

## 2020-04-29 MED ORDER — FENTANYL CITRATE (PF) 100 MCG/2ML IJ SOLN
INTRAMUSCULAR | Status: DC | PRN
  Administered 2020-04-29 (×2): 50 ug via INTRAVENOUS

## 2020-04-29 MED ORDER — DOCUSATE SODIUM 100 MG PO CAPS
100.0000 mg | ORAL_CAPSULE | Freq: Two times a day (BID) | ORAL | Status: DC
  Administered 2020-04-29 – 2020-04-30 (×2): 100 mg via ORAL
  Filled 2020-04-29 (×2): qty 1

## 2020-04-29 MED ORDER — ONDANSETRON HCL 4 MG/2ML IJ SOLN: 4.0000 mg | Freq: Once | INTRAMUSCULAR | Status: DC | PRN

## 2020-04-29 MED ORDER — CHLORHEXIDINE GLUCONATE 0.12 % MT SOLN
15.0000 mL | Freq: Once | OROMUCOSAL | Status: AC
  Administered 2020-04-29: 15 mL via OROMUCOSAL

## 2020-04-29 MED ORDER — DEXAMETHASONE SODIUM PHOSPHATE 10 MG/ML IJ SOLN
INTRAMUSCULAR | Status: DC | PRN
  Administered 2020-04-29: 8 mg via INTRAVENOUS

## 2020-04-29 MED ORDER — PROPOFOL 10 MG/ML IV BOLUS
INTRAVENOUS | Status: DC | PRN
  Administered 2020-04-29: 120 mg via INTRAVENOUS
  Administered 2020-04-29 (×2): 50 mg via INTRAVENOUS

## 2020-04-29 MED ORDER — SENNOSIDES-DOCUSATE SODIUM 8.6-50 MG PO TABS: 1.0000 | ORAL_TABLET | Freq: Every day | ORAL | Status: DC | PRN

## 2020-04-29 MED ORDER — LACTATED RINGERS IV SOLN: INTRAVENOUS | Status: DC

## 2020-04-29 MED ORDER — OXYCODONE HCL 5 MG PO TABS: 5.0000 mg | ORAL_TABLET | Freq: Once | ORAL | Status: DC | PRN

## 2020-04-29 MED ORDER — FENTANYL CITRATE (PF) 250 MCG/5ML IJ SOLN
INTRAMUSCULAR | Status: AC
  Filled 2020-04-29: qty 5

## 2020-04-29 NOTE — Sedation Documentation (Signed)
Anesthesia in to sedate and monitor. 

## 2020-04-29 NOTE — Anesthesia Preprocedure Evaluation (Signed)
Anesthesia Evaluation  Patient identified by MRN, date of birth, ID band Patient awake    Reviewed: Allergy & Precautions, NPO status , Patient's Chart, lab work & pertinent test results  History of Anesthesia Complications Negative for: history of anesthetic complications  Airway Mallampati: II  TM Distance: >3 FB Neck ROM: Full    Dental  (+) Edentulous Upper, Edentulous Lower   Pulmonary COPD, former smoker,    Pulmonary exam normal        Cardiovascular hypertension, Normal cardiovascular exam     Neuro/Psych negative neurological ROS  negative psych ROS   GI/Hepatic Neg liver ROS, GERD  ,  Endo/Other  diabetes, Type 2Hypothyroidism   Renal/GU negative Renal ROS  negative genitourinary   Musculoskeletal  (+) Arthritis ,   Abdominal   Peds  Hematology negative hematology ROS (+)   Anesthesia Other Findings  Myocardial Perfusion 06/30/2014 Summary 1. No significant wall motion abnormality noted.  2. Overall, this is a Low risk scan.  3. Pharmacological myocardial perfusion study with no significant ischemia 4. The estimated ejection fraction is 73%.  5. The left ventricular global function was normal.  6. There are no EKG changes concerning for ischemia.  7. There is no artifact noted on this study.   Last seen by cardiology 11/13/2019.  Stable at this visit with 1 year follow up recommended.    Reproductive/Obstetrics                           Anesthesia Physical Anesthesia Plan  ASA: III  Anesthesia Plan: General   Post-op Pain Management:    Induction: Intravenous  PONV Risk Score and Plan: 3 and Ondansetron, Dexamethasone, Treatment may vary due to age or medical condition and Midazolam  Airway Management Planned: Oral ETT  Additional Equipment: None  Intra-op Plan:   Post-operative Plan: Extubation in OR  Informed Consent: I have reviewed the patients History and  Physical, chart, labs and discussed the procedure including the risks, benefits and alternatives for the proposed anesthesia with the patient or authorized representative who has indicated his/her understanding and acceptance.     Dental advisory given  Plan Discussed with:   Anesthesia Plan Comments:        Anesthesia Quick Evaluation

## 2020-04-29 NOTE — Transfer of Care (Signed)
Immediate Anesthesia Transfer of Care Note  Patient: Cynthia [name]  Procedure(s) Performed: CRYOABLATION (N/A )  Patient Location: PACU  Anesthesia Type:General  Level of Consciousness: awake  Airway & Oxygen Therapy: Patient Spontanous Breathing and Patient connected to face mask oxygen  Post-op Assessment: Report given to RN, Post -op Vital signs reviewed and stable and Patient moving all extremities X 4  Post vital signs: Reviewed and stable  Last Vitals:  Vitals Value Taken Time  BP 145/87 04/29/20 1149  Temp    Pulse 78 04/29/20 1151  Resp 16 04/29/20 1151  SpO2 100 % 04/29/20 1151  Vitals shown include unvalidated device data.  Last Pain: There were no vitals filed for this visit.       Complications: No complications documented.

## 2020-04-29 NOTE — Procedures (Signed)
  Procedure: CT guided cryoablation L renal mass with core biopsy EBL:   minimal Complications:  none immediate  See full dictation in [employer].  Dillard Cannon MD Main # [phone] Pager  [phone]

## 2020-04-29 NOTE — H&P (Signed)
Chief Complaint: Patient was seen in consultation today for left renal mass biopsy and cryoablation.   Referring Physician(s): Billey Co.   Supervising Physician: Arne Cleveland  Patient Status: [facility] - Out-pt  History of Present Illness: Cynthia [name] is a 74 y.o. female with a medical history that includes HTN, DM, CAD, neuroendocrine colon carcinoma with pulmonary nodules and an upper pole left renal mass. This mass is concerning for renal cell carcinoma.   Interventional Radiology has been asked to evaluate this patient for an image-guided percutaneous left renal mass biopsy and cryoablation. Dr. Vernard Gambles met with this patient via a tele-health visit on 04/08/20 and he has approved this procedure.     Past Medical History:  Diagnosis Date  . Arthritis   . 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 COPD (chronic obstructive pulmonary disease) ([place])   . Coronary artery disease    pt. denies  . Diabetes mellitus without complication ([place])    type 2  . GERD (gastroesophageal reflux disease)   . Hyperlipidemia   . Hypertension   . Hypothyroidism   . Lung nodule   . Thyroid disease     Past Surgical History:  Procedure Laterality Date  . BREAST BIOPSY Left 11/25/2015   stereo  neg  . BREAST EXCISIONAL BIOPSY Left yrs ago   benign  . COLONOSCOPY WITH PROPOFOL N/A 04/20/2017   Procedure: COLONOSCOPY WITH PROPOFOL;  Surgeon: [name], MD;  Location: [facility] ENDOSCOPY;  Service: Endoscopy;  Laterality: N/A;  . COLONOSCOPY WITH PROPOFOL N/A 01/08/2018   Procedure: COLONOSCOPY WITH PROPOFOL;  Surgeon: [name], MD;  Location: [facility] ENDOSCOPY;  Service: Gastroenterology;  Laterality: N/A;  . COLONOSCOPY WITH PROPOFOL N/A 08/10/2018   Procedure: COLONOSCOPY WITH PROPOFOL;  Surgeon: [name], MD;  Location: [facility] ENDOSCOPY;  Service: Gastroenterology;  Laterality: N/A;  . IR RADIOLOGIST EVAL & MGMT  04/08/2020  . THYROIDECTOMY     Dr. [name]  .  VAGINAL DELIVERY     4    Allergies: Patient has no known allergies.  Medications: Prior to Admission medications   Medication Sig Start Date End Date Taking? Authorizing Provider  amLODipine (NORVASC) 10 MG tablet TAKE 1 TABLET (10 MG TOTAL) BY MOUTH DAILY. 03/13/20   Burnard Hawthorne, FNP  aspirin EC 81 MG tablet Take 81 mg by mouth daily. Swallow whole.    [provider]  atorvastatin (LIPITOR) 20 MG tablet TAKE 1 TABLET (20 MG TOTAL) BY MOUTH DAILY. Patient taking differently: Take 20 mg by mouth daily.  11/19/19   Burnard Hawthorne, FNP  Calcium Carbonate-Vit D-Min (CALCIUM 600+D PLUS MINERALS) 600-400 MG-UNIT TABS Take 1 tablet by mouth daily.     [provider]  diclofenac sodium (VOLTAREN) 1 % GEL Apply 1 application topically 4 (four) times daily as needed (knee pain.).     [provider]  DULoxetine (CYMBALTA) 30 MG capsule TAKE 1 CAPSULE BY MOUTH ONCE A DAY FOR FIRST WEEK, THEN INCREASE TO 2 CAPS BY MOUTH ONCE DAILY Patient taking differently: Take 60 mg by mouth daily.  01/27/20   Burnard Hawthorne, FNP  glucose blood ([employer] GLUCOSE METER TEST STRIPS) test strip TEST BLOOD SUGAR 1-2 TIMES A DAY 05/25/16   [provider]  glucose blood (ONE TOUCH ULTRA TEST) test strip TEST BLOOD SUGAR 1-2 TIMES A DAY 04/17/19   Burnard Hawthorne, FNP  Lancets (ONETOUCH DELICA PLUS [id number]) [place] USE AS INSTRUCTED 02/24/20  Burnard Hawthorne, FNP  levothyroxine (SYNTHROID) 137 MCG tablet Take 1 tablet (137 mcg total) by mouth daily before breakfast. Patient taking differently: Take 137 mcg by mouth daily at 6 (six) AM.  11/19/19   Arnett, Yvetta Coder, FNP  losartan-hydrochlorothiazide (HYZAAR) 50-12.5 MG tablet Take 1 tablet by mouth daily. 11/19/19   Burnard Hawthorne, FNP  meloxicam (MOBIC) 7.5 MG tablet Take 1 tablet (7.5 mg total) by mouth daily as needed for pain. Take with food. 03/13/20   Burnard Hawthorne, FNP  metFORMIN (GLUCOPHAGE) 500 MG tablet Take one  tablet by mouth two times a day. Patient taking differently: Take 500 mg by mouth in the morning and at bedtime.  11/19/19   Burnard Hawthorne, FNP  metoprolol tartrate (LOPRESSOR) 25 MG tablet Take 1 tablet (25 mg total) by mouth 2 (two) times daily. 04/17/19   Burnard Hawthorne, FNP     Family History  Problem Relation Age of Onset  . Hypertension Mother   . Hypertension Father   . Diabetes Sister   . Thyroid disease Sister   . Breast cancer Maternal Aunt   . Breast cancer Maternal Aunt     Social History   Socioeconomic History  . Marital status: Married    Spouse name: Not on file  . Number of children: 4  . Years of education: Not on file  . Highest education level: Not on file  Occupational History  . Not on file  Tobacco Use  . Smoking status: Former Smoker    Packs/day: 0.75    Years: 48.00    Pack years: 36.00    Types: Cigarettes    Quit date: 2015    Years since quitting: 6.6  . Smokeless tobacco: Former Systems developer    Types: Snuff, Chew  Vaping Use  . Vaping Use: Never used  Substance and Sexual Activity  . Alcohol use: No  . Drug use: No  . Sexual activity: Not Currently  Other Topics Concern  . Not on file  Social History Narrative   Lives in [place] with husband. Has 4 children.      4 grandchildren.      Work - Retired from [employer]- walking the track, 4x per week      Diet- regular      Social Determinants of Radio broadcast assistant Strain: Low Risk   . Difficulty of Paying Living Expenses: Not hard at all  Food Insecurity: No Food Insecurity  . Worried About Charity fundraiser in the Last Year: Never true  . Ran Out of Food in the Last Year: Never true  Transportation Needs: No Transportation Needs  . Lack of Transportation (Medical): No  . Lack of Transportation (Non-Medical): No  Physical Activity:   . Days of Exercise per Week:   . Minutes of Exercise per Session:   Stress: No Stress Concern Present  . Feeling  of Stress : Not at all  Social Connections: Socially Integrated  . Frequency of Communication with Friends and Family: More than three times a week  . Frequency of Social Gatherings with Friends and Family: Once a week  . Attends Religious Services: More than 4 times per year  . Active Member of Clubs or Organizations: Yes  . Attends Archivist Meetings: More than 4 times per year  . Marital Status: Married    Review of Systems: A 12 point ROS discussed and pertinent positives are  indicated in the HPI above.  All other systems are negative.  Review of Systems  Constitutional: Negative for appetite change and fatigue.  Respiratory: Negative for cough and shortness of breath.   Cardiovascular: Negative for chest pain and leg swelling.  Gastrointestinal: Negative for abdominal pain, diarrhea, nausea and vomiting.  Genitourinary: Negative for flank pain and hematuria.  Musculoskeletal: Negative for back pain.  Neurological: Negative for light-headedness and headaches.    Vital Signs: BP (!) 135/97   Pulse 77   Temp 99 F (37.2 C) (Oral)   Resp 16   SpO2 95%   Physical Exam Constitutional:      General: She is not in acute distress. HENT:     Mouth/Throat:     Mouth: Mucous membranes are moist.     Pharynx: Oropharynx is clear.     Comments: edentulous Cardiovascular:     Rate and Rhythm: Normal rate and regular rhythm.     Pulses: Normal pulses.     Heart sounds: Normal heart sounds.  Pulmonary:     Effort: Pulmonary effort is normal.     Breath sounds: Normal breath sounds.  Abdominal:     General: Bowel sounds are normal.     Palpations: Abdomen is soft.  Musculoskeletal:        General: Normal range of motion.  Skin:    General: Skin is warm and dry.  Neurological:     Mental Status: She is alert and oriented to person, place, and time.     Imaging: DG Bone Density  Result Date: 04/21/2020 EXAM: DUAL X-RAY ABSORPTIOMETRY (DXA) FOR BONE MINERAL  DENSITY IMPRESSION: Your patient Cynthia [name] completed a BMD test on 04/21/2020 using the [place] (software version: 14.10) manufactured by [employer]. The following summarizes the results of our evaluation. Technologist: SCE PATIENT BIOGRAPHICAL: Name: Cynthia [name], Cynthia [name] Patient ID: [id number] Birth Date: 05-25-1946 Height: 68.0 in. Gender: Female Exam Date: 04/21/2020 Weight: 240.7 lbs. Indications: Advanced Age, COPD, Diabetic, Early Menopause, Height Loss, Hypothyroid, Postmenopausal, Thyroid Removal Fractures: Treatments: calcium w/ vit D, Levothyroxine, meloxicam, Metformin DENSITOMETRY RESULTS: Site         Region     Measured Date Measured Age WHO Classification Young Adult T-score BMD         %Change vs. Previous Significant Change (*) DualFemur Neck Right 04/21/2020 74.5 Normal -0.8 0.925 g/cm2 -0.5% - DualFemur Neck Right 04/26/2017 71.5 Normal -0.8 0.930 g/cm2 - - DualFemur Total Mean 04/21/2020 74.5 Normal -0.3 0.976 g/cm2 -5.2% Yes DualFemur Total Mean 04/26/2017 71.5 Normal 0.2 1.030 g/cm2 - - Left Forearm Radius 33% 04/21/2020 74.5 Normal -1.0 0.787 g/cm2 -10.9% Yes Left Forearm Radius 33% 04/26/2017 71.5 Normal 0.1 0.883 g/cm2 - - ASSESSMENT: The BMD measured at Forearm Radius 33% is 0.787 g/cm2 with a T-score of -1.0. This patient is considered normal according to [facility]) criteria. The scan quality is good. Compared with prior study, there has been a significant decrease in the total hip. Lumbar spine was not utilized due to advanced degenerative change. World Pharmacologist [facility]) criteria for post-menopausal, Caucasian Women: Normal:                   T-score at or above -1 SD Osteopenia/low bone mass: T-score between -1 and -2.5 SD Osteoporosis:             T-score at or below -2.5 SD RECOMMENDATIONS: 1. All patients should optimize calcium and vitamin D intake. 2.  Consider FDA-approved medical therapies in postmenopausal women and men  aged 33 years and older, based on the following: a. A hip or vertebral(clinical or morphometric) fracture b. T-score < -2.5 at the femoral neck or spine after appropriate evaluation to exclude secondary causes c. Low bone mass (T-score between -1.0 and -2.5 at the femoral neck or spine) and a 10-year probability of a hip fracture > 3% or a 10-year probability of a major osteoporosis-related fracture > 20% based on the [place]-adapted WHO algorithm 3. Clinician judgment and/or patient preferences may indicate treatment for people with 10-year fracture probabilities above or below these levels FOLLOW-UP: People with diagnosed cases of osteoporosis or at high risk for fracture should have regular bone mineral density tests. For patients eligible for Medicare, routine testing is allowed once every 2 years. The testing frequency can be increased to one year for patients who have rapidly progressing disease, those who are receiving or discontinuing medical therapy to restore bone mass, or have additional risk factors. Electronically Signed   By: Marlaine Hind M.D.   On: 04/21/2020 10:39   MM 3D SCREEN BREAST BILATERAL  Result Date: 04/22/2020 CLINICAL DATA:  Screening. EXAM: DIGITAL SCREENING BILATERAL MAMMOGRAM WITH TOMO AND CAD COMPARISON:  Previous exam(s). ACR Breast Density Category b: There are scattered areas of fibroglandular density. FINDINGS: There are no findings suspicious for malignancy. Images were processed with CAD. IMPRESSION: No mammographic evidence of malignancy. A result letter of this screening mammogram will be mailed directly to the patient. RECOMMENDATION: Screening mammogram in one year. (Code:SM-B-01Y) BI-RADS CATEGORY  1: Negative. Electronically Signed   By: Lillia Mountain M.D.   On: 04/22/2020 14:18   IR Radiologist Eval & Mgmt  Result Date: 04/08/2020 Please refer to notes tab for details about interventional procedure. (Op Note)   Labs:  CBC: Recent Labs    09/09/19 1007  03/11/20 1028 04/27/20 1112  WBC 4.6 4.5 4.8  HGB 14.4 13.9 14.2  HCT 44.9 40.4 42.9  PLT 268 273 257    COAGS: Recent Labs    04/27/20 1112  INR 1.0  APTT 26    BMP: Recent Labs    09/09/19 1007 12/06/19 1201 03/11/20 1028 04/27/20 1112  NA 136 136 138 138  K 3.7 3.8 4.0 4.0  CL 99 100 101 102  CO2 29 31 28 27   GLUCOSE 130* 94 124* 82  BUN 15 12 14 12   CALCIUM 9.7 9.7 9.2 9.9  CREATININE 0.71 0.70 0.83 0.62  GFRNONAA >60  --  >60 >60  GFRAA >60  --  >60 >60    LIVER FUNCTION TESTS: Recent Labs    09/09/19 1007 12/06/19 1201 03/11/20 1028  BILITOT 0.6 0.6 0.7  AST 26 25 28   ALT 24 24 26   ALKPHOS 63 60 59  PROT 7.4 7.1 7.3  ALBUMIN 4.2 4.1 4.1    TUMOR MARKERS: Recent Labs    09/09/19 [place] 1    Assessment and Plan:  Left renal mass; history of neuroendocrine colon carcinoma: Cynthia [name], 74 year old female, presents today to the [facility] department for an image-guided percutaneous left renal mass biopsy and cryoablation.   Risks and benefits of an image-guided renal mass biopsy and cryoablation were discussed with the patient including, but not limited to, failure to treat entire lesion, bleeding, infection, damage to adjacent structures, hematuria, urine leak, decrease in renal function or post procedural neuropathy.  All of the patient's questions were answered and the patient  is agreeable to proceed.  The patient has been NPO. Labs and vitals have been reviewed.  This procedure will involve general anesthesia. Patient will be admitted for overnight observation.   Consent signed and in chart.  Thank you for this interesting consult.  I greatly enjoyed meeting Cynthia [name] and look forward to participating in their care.  A copy of this report was sent to the requesting provider on this date.  Electronically Signed: Soyla Dryer, AGACNP-BC [phone] 04/29/2020, 7:47 AM   I spent a total  of  30 Minutes   in face to face in clinical consultation, greater than 50% of which was counseling/coordinating care for and image-guided renal mass biopsy and cryoablation.

## 2020-04-29 NOTE — Anesthesia Procedure Notes (Signed)
Procedure Name: Intubation Date/Time: 04/29/2020 8:43 AM Performed by: Niel Hummer, CRNA Pre-anesthesia Checklist: Patient identified, Emergency Drugs available, Suction available and Patient being monitored Patient Re-evaluated:Patient Re-evaluated prior to induction Oxygen Delivery Method: Circle system utilized Preoxygenation: Pre-oxygenation with 100% oxygen Induction Type: IV induction Ventilation: Mask ventilation without difficulty Laryngoscope Size: Mac and 4 Grade View: Grade I Tube type: Oral Tube size: 7.0 mm Number of attempts: 1 Airway Equipment and Method: Stylet Placement Confirmation: ETT inserted through vocal cords under direct vision,  positive ETCO2 and breath sounds checked- equal and bilateral Secured at: 22 cm Tube secured with: Tape Dental Injury: Teeth and Oropharynx as per pre-operative assessment

## 2020-04-30 DIAGNOSIS — Z79899 Other long term (current) drug therapy: Secondary | ICD-10-CM | POA: Diagnosis not present

## 2020-04-30 DIAGNOSIS — Z833 Family history of diabetes mellitus: Secondary | ICD-10-CM | POA: Diagnosis not present

## 2020-04-30 DIAGNOSIS — J449 Chronic obstructive pulmonary disease, unspecified: Secondary | ICD-10-CM | POA: Diagnosis not present

## 2020-04-30 DIAGNOSIS — Z8349 Family history of other endocrine, nutritional and metabolic diseases: Secondary | ICD-10-CM | POA: Diagnosis not present

## 2020-04-30 DIAGNOSIS — Z8249 Family history of ischemic heart disease and other diseases of the circulatory system: Secondary | ICD-10-CM | POA: Diagnosis not present

## 2020-04-30 DIAGNOSIS — C649 Malignant neoplasm of unspecified kidney, except renal pelvis: Secondary | ICD-10-CM | POA: Diagnosis not present

## 2020-04-30 DIAGNOSIS — E890 Postprocedural hypothyroidism: Secondary | ICD-10-CM | POA: Diagnosis not present

## 2020-04-30 DIAGNOSIS — Z7982 Long term (current) use of aspirin: Secondary | ICD-10-CM | POA: Diagnosis not present

## 2020-04-30 DIAGNOSIS — Z87891 Personal history of nicotine dependence: Secondary | ICD-10-CM | POA: Diagnosis not present

## 2020-04-30 DIAGNOSIS — I2510 Atherosclerotic heart disease of native coronary artery without angina pectoris: Secondary | ICD-10-CM | POA: Diagnosis not present

## 2020-04-30 DIAGNOSIS — E785 Hyperlipidemia, unspecified: Secondary | ICD-10-CM | POA: Diagnosis not present

## 2020-04-30 DIAGNOSIS — M1990 Unspecified osteoarthritis, unspecified site: Secondary | ICD-10-CM | POA: Diagnosis not present

## 2020-04-30 DIAGNOSIS — I10 Essential (primary) hypertension: Secondary | ICD-10-CM | POA: Diagnosis not present

## 2020-04-30 DIAGNOSIS — Z7984 Long term (current) use of oral hypoglycemic drugs: Secondary | ICD-10-CM | POA: Diagnosis not present

## 2020-04-30 DIAGNOSIS — E119 Type 2 diabetes mellitus without complications: Secondary | ICD-10-CM | POA: Diagnosis not present

## 2020-04-30 LAB — BASIC METABOLIC PANEL
Anion gap: 13 (ref 5–15)
BUN: 15 mg/dL (ref 8–23)
CO2: 24 mmol/L (ref 22–32)
Calcium: 8.8 mg/dL — ABNORMAL LOW (ref 8.9–10.3)
Chloride: 101 mmol/L (ref 98–111)
Creatinine, Ser: 0.66 mg/dL (ref 0.44–1.00)
GFR calc Af Amer: >60 mL/min (ref >60)
GFR calc non Af Amer: >60 mL/min (ref >60)
Glucose, Bld: 154 mg/dL — ABNORMAL HIGH (ref 70–99)
Potassium: 4.1 mmol/L (ref 3.5–5.1)
Sodium: 138 mmol/L (ref 135–145)

## 2020-04-30 LAB — SURGICAL PATHOLOGY

## 2020-04-30 MED ORDER — OXYCODONE-ACETAMINOPHEN 5-325 MG PO TABS: 1.0000 | ORAL_TABLET | Freq: Four times a day (QID) | ORAL | 0 refills | Status: DC | PRN

## 2020-04-30 NOTE — Discharge Instructions (Signed)
Cryoablation of Renal Tumors, Care After This sheet gives you information about how to care for yourself after your procedure. Your health care provider may also give you more specific instructions. If you have problems or questions, contact your health care provider. What can I expect after the procedure? After your procedure, it is common to have pain and discomfort in the left flank area. You will be given pain medicines to control this- these have been sent to your pharmacy. Follow these instructions at home: Incision care  Follow instructions from your health care provider about how to take care of your incision. Make sure you: ? Wash your hands with soap and water before you change your bandage (dressing). If soap and water are not available, use hand sanitizer. ? Ok to remove bandage after first shower- no further dressing changes needed after this, ensure area remains clean/dry.  Check your incision area every day for signs of infection. Check for: ? Redness, swelling, or pain. ? Fluid or blood. ? Warmth. ? Pus or a bad smell. Medicines  Take Percocet 5-325 tablet once every 6 hours as needed for moderate-severe pain. This has been sent to your pharmacy.  Do not drive or use heavy machinery while taking prescription pain medicine. Activity  Rest as often as needing during the first few days of recovery.  Advance activity as tolerated. General instructions  Do not take baths, swim, or use a hot tub for 7 days post-procedure. Ok to shower 48 hours post-procedure. Contact a health care provider if:  You cannot pass gas.  You are unable to have a bowel movement within 2 days.  You have a skin rash. Get help right away if:  You have a fever.  You have severe or lasting pain in your abdomen, shoulder, or back.  You have trouble swallowing or breathing.  You have severe weakness or dizziness.  You have chest pain or shortness of breath.  This information is not  intended to replace advice given to you by your health care provider. Make sure you discuss any questions you have with your health care provider. Document Revised: 08/18/2017 Document Reviewed: 11/24/2016 Elsevier Patient Education  [place].

## 2020-04-30 NOTE — Anesthesia Postprocedure Evaluation (Signed)
Anesthesia Post Note  Patient: Cynthia [name]  Procedure(s) Performed: CRYOABLATION (N/A )     Patient location during evaluation: PACU Anesthesia Type: General Level of consciousness: awake and alert Pain management: pain level controlled Vital Signs Assessment: post-procedure vital signs reviewed and stable Respiratory status: spontaneous breathing, nonlabored ventilation, respiratory function stable and patient connected to nasal cannula oxygen Cardiovascular status: blood pressure returned to baseline and stable Postop Assessment: no apparent nausea or vomiting Anesthetic complications: no   No complications documented.  Last Vitals:  Vitals:   04/30/20 0610 04/30/20 1007  BP: 121/82 135/78  Pulse: 89 92  Resp: 15 18  Temp: 36.8 C 36.9 C  SpO2: 100% 99%    Last Pain:  Vitals:   04/30/20 1007  TempSrc: Oral  PainSc:    Pain Goal: Patients Stated Pain Goal: 2 (04/29/20 2244)                 Lidia Collum

## 2020-04-30 NOTE — Discharge Summary (Signed)
Patient ID: Cynthia [name] MRN: [id number] DOB/AGE: 74-Jan-1947 74 y.o.  Admit date: 04/29/2020 Discharge date: 04/30/2020  Supervising Physician: Aletta Edouard  Patient Status: [facility] - In-pt  Admission Diagnoses: Renal mass  Discharge Diagnoses:  Active Problems:   Renal mass   Discharged Condition: stable  Hospital Course:  Patient presented to [facility] 04/29/2020 for an image-guided left renal mass biopsy and cryoablation by Dr. Vernard Gambles. Procedure occurred without major complications and patient was transferred to floor in stable condition (VSS, left flank incisions stable) for overnight observation. No major events occurred overnight.   Patient awake and alert sitting in bed. Complains of left flank pain rated 5/10 at this time. Denies hematuria s/p foley removal. Left flank incisions stable. Plan to discharge home today and follow-up with Dr. Vernard Gambles for televisit 4 weeks after discharge.  Percocet 5-325 tablets Q6HPRN moderate-severe pain, #15 sent to pharmacy.   Consults: None  Significant Diagnostic Studies: CT GUIDE TISSUE ABLATION  Result Date: 04/29/2020 INDICATION: Left renal complex cystic mass, partially exophytic, concerning for renal cell carcinoma EXAM: CT-GUIDED PERCUTANEOUS CRYOABLATION OF LEFT RENAL MASS CT GUIDED CORE BIOPSY LEFT RENAL MASS ANESTHESIA/SEDATION: General MEDICATIONS: Cefazolin 2 g. The antibiotic was administered in an appropriate time interval prior to needle puncture of the skin. CONTRAST:  None required PROCEDURE: The procedure, risks, benefits, and alternatives were explained to the patient. Questions regarding the procedure were encouraged and answered. The patient understands and consents to the procedure. The patient was placed under general anesthesia. Initial unenhanced CT was performed in a prone position to localize the partially exophytic cystic left renal mass. The patient was prepped with chlorhexidine in a sterile fashion, and a  sterile drape was applied covering the operative field. A sterile gown and sterile gloves were used for the procedure. Under CT guidance, 3 IceRod percutaneous cryoablation probes advanced sequentially into the left renal mass. Probe positioning was confirmed by CT prior to cryoablation. Subsequently, 17 gauge trocar needle was advanced to the margin of the lesion medial aspect and coaxial 18 gauge core biopsy samples were obtained. The guide needle was removed. Cryoablation was performed through the 3 probes. Initial 10 minute cycle of cryoablation was performed. CT demonstrated good ice ball placement. This was followed by a 8 minute thaw cycle. A second 10 minute cycle of cryoablation was then performed. During ablation, periodic CT imaging was performed to monitor ice ball formation and morphology. After active thaw, the cryoablation probes were removed. Post-procedural CT was performed. COMPLICATIONS: None immediate. FINDINGS: Partially exophytic complex cystic left renal lesion was localized. No significant change in morphology since previous MR. Cryoablation positioned, core biopsy samples obtained, and cryoablation of the lesion performed without immediate complication. Small amount of intralesional and regional retroperitoneal hemorrhage was identified postprocedure. Patient remained hemodynamically stable. IMPRESSION: 1. Technically successful CT guided percutaneous cryoablation of left renal mass. 2. CT-guided core biopsy, left renal mass. PLAN: The patient will be observed overnight. Initial follow-up will be performed in approximately 4 weeks. Electronically Signed   By: Lucrezia Europe M.D.   On: 04/29/2020 14:29   CT BIOPSY  Result Date: 04/29/2020 INDICATION: Left renal complex cystic mass, partially exophytic, concerning for renal cell carcinoma EXAM: CT-GUIDED PERCUTANEOUS CRYOABLATION OF LEFT RENAL MASS CT GUIDED CORE BIOPSY LEFT RENAL MASS ANESTHESIA/SEDATION: General MEDICATIONS: Cefazolin 2 g.  The antibiotic was administered in an appropriate time interval prior to needle puncture of the skin. CONTRAST:  None required PROCEDURE: The procedure, risks, benefits, and alternatives were explained to  the patient. Questions regarding the procedure were encouraged and answered. The patient understands and consents to the procedure. The patient was placed under general anesthesia. Initial unenhanced CT was performed in a prone position to localize the partially exophytic cystic left renal mass. The patient was prepped with chlorhexidine in a sterile fashion, and a sterile drape was applied covering the operative field. A sterile gown and sterile gloves were used for the procedure. Under CT guidance, 3 IceRod percutaneous cryoablation probes advanced sequentially into the left renal mass. Probe positioning was confirmed by CT prior to cryoablation. Subsequently, 17 gauge trocar needle was advanced to the margin of the lesion medial aspect and coaxial 18 gauge core biopsy samples were obtained. The guide needle was removed. Cryoablation was performed through the 3 probes. Initial 10 minute cycle of cryoablation was performed. CT demonstrated good ice ball placement. This was followed by a 8 minute thaw cycle. A second 10 minute cycle of cryoablation was then performed. During ablation, periodic CT imaging was performed to monitor ice ball formation and morphology. After active thaw, the cryoablation probes were removed. Post-procedural CT was performed. COMPLICATIONS: None immediate. FINDINGS: Partially exophytic complex cystic left renal lesion was localized. No significant change in morphology since previous MR. Cryoablation positioned, core biopsy samples obtained, and cryoablation of the lesion performed without immediate complication. Small amount of intralesional and regional retroperitoneal hemorrhage was identified postprocedure. Patient remained hemodynamically stable. IMPRESSION: 1. Technically successful  CT guided percutaneous cryoablation of left renal mass. 2. CT-guided core biopsy, left renal mass. PLAN: The patient will be observed overnight. Initial follow-up will be performed in approximately 4 weeks. Electronically Signed   By: Lucrezia Europe M.D.   On: 04/29/2020 14:29   DG Bone Density  Result Date: 04/21/2020 EXAM: DUAL X-RAY ABSORPTIOMETRY (DXA) FOR BONE MINERAL DENSITY IMPRESSION: Your patient Kandas Oliveto completed a BMD test on 04/21/2020 using the [place] (software version: 14.10) manufactured by [employer]. The following summarizes the results of our evaluation. Technologist: SCE PATIENT BIOGRAPHICAL: Name: Gowri, Suchan Patient ID: [id number] Birth Date: [birth year] Height: 68.0 in. Gender: Female Exam Date: 04/21/2020 Weight: 240.7 lbs. Indications: Advanced Age, COPD, Diabetic, Early Menopause, Height Loss, Hypothyroid, Postmenopausal, Thyroid Removal Fractures: Treatments: calcium w/ vit D, Levothyroxine, meloxicam, Metformin DENSITOMETRY RESULTS: Site         Region     Measured Date Measured Age WHO Classification Young Adult T-score BMD         %Change vs. Previous Significant Change (*) DualFemur Neck Right 04/21/2020 74.5 Normal -0.8 0.925 g/cm2 -0.5% - DualFemur Neck Right 04/26/2017 71.5 Normal -0.8 0.930 g/cm2 - - DualFemur Total Mean 04/21/2020 74.5 Normal -0.3 0.976 g/cm2 -5.2% Yes DualFemur Total Mean 04/26/2017 71.5 Normal 0.2 1.030 g/cm2 - - Left Forearm Radius 33% 04/21/2020 74.5 Normal -1.0 0.787 g/cm2 -10.9% Yes Left Forearm Radius 33% 04/26/2017 71.5 Normal 0.1 0.883 g/cm2 - - ASSESSMENT: The BMD measured at Forearm Radius 33% is 0.787 g/cm2 with a T-score of -1.0. This patient is considered normal according to [facility]) criteria. The scan quality is good. Compared with prior study, there has been a significant decrease in the total hip. Lumbar spine was not utilized due to advanced degenerative change. World Health  [facility]) criteria for post-menopausal, Caucasian Women: Normal:                   T-score at or above -1 SD Osteopenia/low bone mass: T-score between -1 and -  2.5 SD Osteoporosis:             T-score at or below -2.5 SD RECOMMENDATIONS: 1. All patients should optimize calcium and vitamin D intake. 2. Consider FDA-approved medical therapies in postmenopausal women and men aged 63 years and older, based on the following: a. A hip or vertebral(clinical or morphometric) fracture b. T-score < -2.5 at the femoral neck or spine after appropriate evaluation to exclude secondary causes c. Low bone mass (T-score between -1.0 and -2.5 at the femoral neck or spine) and a 10-year probability of a hip fracture > 3% or a 10-year probability of a major osteoporosis-related fracture > 20% based on the [place]-adapted WHO algorithm 3. Clinician judgment and/or patient preferences may indicate treatment for people with 10-year fracture probabilities above or below these levels FOLLOW-UP: People with diagnosed cases of osteoporosis or at high risk for fracture should have regular bone mineral density tests. For patients eligible for Medicare, routine testing is allowed once every 2 years. The testing frequency can be increased to one year for patients who have rapidly progressing disease, those who are receiving or discontinuing medical therapy to restore bone mass, or have additional risk factors. Electronically Signed   By: Marlaine Hind M.D.   On: 04/21/2020 10:39   MM 3D SCREEN BREAST BILATERAL  Result Date: 04/22/2020 CLINICAL DATA:  Screening. EXAM: DIGITAL SCREENING BILATERAL MAMMOGRAM WITH TOMO AND CAD COMPARISON:  Previous exam(s). ACR Breast Density Category b: There are scattered areas of fibroglandular density. FINDINGS: There are no findings suspicious for malignancy. Images were processed with CAD. IMPRESSION: No mammographic evidence of malignancy. A result letter of this screening mammogram will be mailed directly  to the patient. RECOMMENDATION: Screening mammogram in one year. (Code:SM-B-01Y) BI-RADS CATEGORY  1: Negative. Electronically Signed   By: Lillia Mountain M.D.   On: 04/22/2020 14:18   IR Radiologist Eval & Mgmt  Result Date: 04/08/2020 Please refer to notes tab for details about interventional procedure. (Op Note)   Treatments: Ct-guided cryoablation of left renal lesion  Discharge Exam: Blood pressure 135/78, pulse 92, temperature 98.5 F (36.9 C), temperature source Oral, resp. rate 18, height 5\' 9"  (1.753 m), weight 247 lb 12.8 oz (112.4 kg), SpO2 99 %. Physical Exam Vitals and nursing note reviewed.  Constitutional:      General: She is not in acute distress.    Appearance: Normal appearance.  Cardiovascular:     Rate and Rhythm: Normal rate and regular rhythm.     Heart sounds: Normal heart sounds. No murmur heard.   Pulmonary:     Effort: Pulmonary effort is normal. No respiratory distress.     Breath sounds: Normal breath sounds. No wheezing.  Abdominal:     Comments: Left flank incisions soft without active bleeding or hematoma.  Skin:    General: Skin is warm and dry.  Neurological:     Mental Status: She is alert and oriented to person, place, and time.     Disposition: Discharge disposition: 01-Home or Self Care       Discharge Instructions    Call MD for:  difficulty breathing, headache or visual disturbances   Complete by: As directed    Call MD for:  extreme fatigue   Complete by: As directed    Call MD for:  hives   Complete by: As directed    Call MD for:  persistant dizziness or light-headedness   Complete by: As directed    Call MD for:  persistant  nausea and vomiting   Complete by: As directed    Call MD for:  redness, tenderness, or signs of infection (pain, swelling, redness, odor or green/yellow discharge around incision site)   Complete by: As directed    Call MD for:  severe uncontrolled pain   Complete by: As directed    Call MD for:   temperature >100.4   Complete by: As directed    Diet - low sodium heart healthy   Complete by: As directed    Discharge instructions   Complete by: As directed    Ok to shower 48 hours post-procedure. Recommend showering with bandage on, remove bandage immediately after shower and pat area dry. No further dressing changes needed after this- ensure area remains clean/dry until healed. No submerging (bathing, swimming) for 7 days post-procedure. Take Percocet 5-325 mg tablets once every 6 hours as needed for moderate-severe pain. This medication has been sent to your pharmacy. Plan to follow-up with Dr. Vernard Gambles for televisit 4 weeks after discharge.   Discharge wound care:   Complete by: As directed    Please see discharge instructions for wound care instructions.   Increase activity slowly   Complete by: As directed      Allergies as of 04/30/2020   No Known Allergies     Medication List    TAKE these medications   amLODipine 10 MG tablet Commonly known as: NORVASC TAKE 1 TABLET (10 MG TOTAL) BY MOUTH DAILY.   aspirin EC 81 MG tablet Take 81 mg by mouth daily. Swallow whole.   atorvastatin 20 MG tablet Commonly known as: LIPITOR TAKE 1 TABLET (20 MG TOTAL) BY MOUTH DAILY. What changed:   how much to take  how to take this  when to take this  additional instructions   Calcium 600+D Plus Minerals 600-400 MG-UNIT Tabs Take 1 tablet by mouth daily.   diclofenac sodium 1 % Gel Commonly known as: VOLTAREN Apply 1 application topically 4 (four) times daily as needed (knee pain.).   DULoxetine 30 MG capsule Commonly known as: CYMBALTA TAKE 1 CAPSULE BY MOUTH ONCE A DAY FOR FIRST WEEK, THEN INCREASE TO 2 CAPS BY MOUTH ONCE DAILY What changed:   how much to take  how to take this  when to take this  additional instructions   [employer] Glucose Meter Test Strips test strip Generic drug: glucose blood TEST BLOOD SUGAR 1-2 TIMES A DAY   glucose blood test  strip Commonly known as: ONE TOUCH ULTRA TEST TEST BLOOD SUGAR 1-2 TIMES A DAY   levothyroxine 137 MCG tablet Commonly known as: SYNTHROID Take 1 tablet (137 mcg total) by mouth daily before breakfast. What changed: when to take this   losartan-hydrochlorothiazide 50-12.5 MG tablet Commonly known as: HYZAAR Take 1 tablet by mouth daily.   meloxicam 7.5 MG tablet Commonly known as: MOBIC Take 1 tablet (7.5 mg total) by mouth daily as needed for pain. Take with food.   metFORMIN 500 MG tablet Commonly known as: GLUCOPHAGE Take one tablet by mouth two times a day. What changed:   how much to take  how to take this  when to take this  additional instructions   metoprolol tartrate 25 MG tablet Commonly known as: LOPRESSOR Take 1 tablet (25 mg total) by mouth 2 (two) times daily.   OneTouch Delica Plus [id number] Misc USE AS INSTRUCTED   oxyCODONE-acetaminophen 5-325 MG tablet Commonly known as: Percocet Take 1 tablet by mouth every 6 (six) hours as needed  for moderate pain or severe pain.            Discharge Care Instructions  (From admission, onward)         Start     Ordered   04/30/20 0000  Discharge wound care:       Comments: Please see discharge instructions for wound care instructions.   04/30/20 1044          Follow-up Information    Arne Cleveland, MD Follow up in 4 week(s).   Specialties: Interventional Radiology, Radiology Why: Please plan to follow-up with Dr. Vernard Gambles for televisit 4 weeks after discharge. Our office will call you to set up this appointment. Contact information: [place] [address] [phone]                Electronically Signed: Earley Abide, PA-C 04/30/2020, 10:48 AM   I have spent Less Than 30 Minutes discharging Cynthia [name].

## 2020-04-30 NOTE — Plan of Care (Signed)
Pt was discharged home today. Instructions were reviewed with patient, and questions were answered. Pt was taken to main entrance via wheelchair by NT.  

## 2020-05-01 ENCOUNTER — Encounter (HOSPITAL_COMMUNITY): Payer: Self-pay | Admitting: Interventional Radiology

## 2020-05-06 ENCOUNTER — Ambulatory Visit: Payer: Medicare Other | Admitting: Urology

## 2020-05-06 ENCOUNTER — Other Ambulatory Visit: Payer: Self-pay

## 2020-05-06 ENCOUNTER — Encounter: Payer: Self-pay | Admitting: Urology

## 2020-05-06 VITALS — BP 127/82 | HR 86 | Ht 68.0 in | Wt 238.0 lb

## 2020-05-06 DIAGNOSIS — N2889 Other specified disorders of kidney and ureter: Secondary | ICD-10-CM

## 2020-05-06 NOTE — Progress Notes (Signed)
   05/06/2020 11:33 AM   Cynthia [name] 1946-08-03 [id number]  Reason for visit: Follow up renal mass  HPI: I saw Cynthia [name] in urology clinic for follow-up of a 3 cm left cystic renal mass.  Briefly, she is a 74 year old comorbid female with COPD, CAD, diabetes, and history of neuroendocrine cancer of the colon who opted for renal mass biopsy and ablation with interventional radiology.  She underwent this procedure with Dr. 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  She has been doing very well since that time and denies any complaints, specifically any gross hematuria, flank pain, abdominal pain, or fevers.  I discussed her case with Dr. Vernard Gambles in interventional radiology, and their department will follow her with surveillance imaging at 3 and 6 months, and then yearly up to 5 years to evaluate for any recurrence.   We discussed the need for ongoing close surveillance, as well as monitoring of renal function, and she understands this going forward.  We discussed return precautions at length.  RTC with me in 8 to 9 months with BMP prior, 3 and 37-month imaging will be performed with Dr. Vernard Gambles in interventional radiology.  [place], [facility] [address] [place], [place] [address] [phone]

## 2020-05-26 ENCOUNTER — Ambulatory Visit
Admission: RE | Admit: 2020-05-26 | Discharge: 2020-05-26 | Disposition: A | Discharge status: Home / Self Care | Payer: Medicare Other | Source: Ambulatory Visit | Attending: Student | Admitting: Student

## 2020-05-26 ENCOUNTER — Other Ambulatory Visit: Payer: Self-pay

## 2020-05-26 ENCOUNTER — Encounter: Payer: Self-pay | Admitting: *Deleted

## 2020-05-26 DIAGNOSIS — Z9889 Other specified postprocedural states: Secondary | ICD-10-CM | POA: Diagnosis not present

## 2020-05-26 DIAGNOSIS — C642 Malignant neoplasm of left kidney, except renal pelvis: Secondary | ICD-10-CM | POA: Diagnosis not present

## 2020-05-26 DIAGNOSIS — N2889 Other specified disorders of kidney and ureter: Secondary | ICD-10-CM

## 2020-05-26 HISTORY — PX: IR RADIOLOGIST EVAL & MGMT: IMG5224

## 2020-05-26 NOTE — Progress Notes (Signed)
Patient ID: [name], female   DOB: [birth year], 74 y.o.   MRN: [id number]       Chief Complaint: Patient was consulted remotely today (TeleHealth) for scheduled follow-up of percutaneous cryoablation left renal tumor at the request of Louk,Alexandra M.    Referring Physician(s): Sninsky,Brian C  History of Present Illness: ANASTAZIA CREEK is a 74 y.o. female Who was seen for scheduled follow-up approximately 1 month post CT-guided cryoablation of left renal mass.  To review, the patient has  a history of neuroendocrine colon carcinoma and pulmonary nodules. 02/05/2018 PET/CT showed no residual/recurrent disease related to her colon carcinoma.  Small low-attenuation left renal lesion is in retrospect noted. 02/19/2020 on a follow-up CT chest study, interval increase in size of left renal mass was identified 03/02/2020 renal ultrasound confirms solitary 2.7 cm left mid renal mass 03/09/2020 MR confirms 2.1 cm complex left upper pole renal lesion, partially exophytic.  No regional invasion or adenopathy. The patient is asymptomatic from  this lesion.  No renal insufficiency.  No hematuria, flank pain, weight loss.  No history of kidney cancer.  Positive history of tobacco abuse. 04/29/2020 CT-guided percutaneous cryoablation of left renal mass with core biopsy. 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  She describes only minimal discomfort at the treatment site for 2 days post procedure.  Needle entry sites have completely healed.  No significant hematuria.  No new symptoms.   Past Medical History:  Diagnosis Date  . Arthritis   . 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 COPD (chronic obstructive pulmonary disease) ([place])   . Coronary artery disease    pt. denies  . Diabetes mellitus without complication ([place])    type 2  . GERD (gastroesophageal reflux disease)     . Hyperlipidemia   . Hypertension   . Hypothyroidism   . Lung nodule   . Thyroid disease     Past Surgical History:  Procedure Laterality Date  . BREAST BIOPSY Left 11/25/2015   stereo  neg  . BREAST EXCISIONAL BIOPSY Left yrs ago   benign  . COLONOSCOPY WITH PROPOFOL N/A 04/20/2017   Procedure: COLONOSCOPY WITH PROPOFOL;  Surgeon: [name], MD;  Location[facility] ENDOSCOPY;  Service: Endoscopy;  Laterality: N/A;  . COLONOSCOPY WITH PROPOFOL N/A 01/08/2018   Procedure: COLONOSCOPY WITH PROPOFOL;  Surgeon: [name], MD;  Location: [facility] ENDOSCOPY;  Service: Gastroenterology;  Laterality: N/A;  . COLONOSCOPY WITH PROPOFOL N/A 08/10/2018   Procedure: COLONOSCOPY WITH PROPOFOL;  Surgeon: [name], MD;  Location: [facility] ENDOSCOPY;  Service: Gastroenterology;  Laterality: N/A;  . IR RADIOLOGIST EVAL & MGMT  04/08/2020  . RADIOLOGY WITH ANESTHESIA N/A 04/29/2020   Procedure: CRYOABLATION;  Surgeon: Arne Cleveland, MD;  Location: [facility] ORS;  Service: Radiology;  Laterality: N/A;  . THYROIDECTOMY     Dr. [name]  . VAGINAL DELIVERY     4    Allergies: Patient has no known allergies.  Medications: Prior to Admission medications   Medication Sig Start Date End Date Taking? Authorizing Provider  amLODipine (NORVASC) 10 MG tablet TAKE 1 TABLET (10 MG TOTAL) BY MOUTH DAILY. 03/13/20   Burnard Hawthorne, FNP  aspirin EC 81 MG tablet Take 81 mg by mouth daily. Swallow whole.    [provider]  atorvastatin (LIPITOR) 20 MG tablet TAKE 1 TABLET (20 MG TOTAL) BY MOUTH DAILY. 11/19/19   Arnett,  Yvetta Coder, FNP  Calcium Carbonate-Vit D-Min (CALCIUM 600+D PLUS MINERALS) 600-400 MG-UNIT TABS Take 1 tablet by mouth daily.     [provider]  diclofenac sodium (VOLTAREN) 1 % GEL Apply 1 application topically 4 (four) times daily as needed (knee pain.).     [provider]  DULoxetine (CYMBALTA) 30 MG capsule TAKE 1 CAPSULE BY MOUTH ONCE A DAY FOR FIRST WEEK, THEN INCREASE TO 2  CAPS BY MOUTH ONCE DAILY 01/27/20   Burnard Hawthorne, FNP  glucose blood ([facility] GLUCOSE METER TEST STRIPS) test strip TEST BLOOD SUGAR 1-2 TIMES A DAY 05/25/16   [provider]  glucose blood (ONE TOUCH ULTRA TEST) test strip TEST BLOOD SUGAR 1-2 TIMES A DAY 04/17/19   Burnard Hawthorne, FNP  Lancets [facility] DELICA PLUS [id number]) [place] USE AS INSTRUCTED 02/24/20   Burnard Hawthorne, FNP  levothyroxine (SYNTHROID) 137 MCG tablet Take 1 tablet (137 mcg total) by mouth daily before breakfast. 11/19/19   Burnard Hawthorne, FNP  losartan-hydrochlorothiazide (HYZAAR) 50-12.5 MG tablet Take 1 tablet by mouth daily. 11/19/19   Burnard Hawthorne, FNP  meloxicam (MOBIC) 7.5 MG tablet Take 1 tablet (7.5 mg total) by mouth daily as needed for pain. Take with food. 03/13/20   Burnard Hawthorne, FNP  metFORMIN (GLUCOPHAGE) 500 MG tablet Take one tablet by mouth two times a day. 11/19/19   Burnard Hawthorne, FNP  metoprolol tartrate (LOPRESSOR) 25 MG tablet Take 1 tablet (25 mg total) by mouth 2 (two) times daily. 04/17/19   Burnard Hawthorne, FNP  oxyCODONE-acetaminophen (PERCOCET) 5-325 MG tablet Take 1 tablet by mouth every 6 (six) hours as needed for moderate pain or severe pain. 04/30/20   Louk, Bea Graff, PA-C     Family History  Problem Relation Age of Onset  . Hypertension Mother   . Hypertension Father   . Diabetes Sister   . Thyroid disease Sister   . Breast cancer Maternal Aunt   . Breast cancer Maternal Aunt     Social History   Socioeconomic History  . Marital status: Married    Spouse name: Not on file  . Number of children: 4  . Years of education: Not on file  . Highest education level: Not on file  Occupational History  . Not on file  Tobacco Use  . Smoking status: Former Smoker    Packs/day: 0.75    Years: 48.00    Pack years: 36.00    Types: Cigarettes    Quit date: 2015    Years since quitting: 6.6  . Smokeless tobacco: Former Systems developer    Types: Snuff, Chew  Vaping  Use  . Vaping Use: Never used  Substance and Sexual Activity  . Alcohol use: No  . Drug use: No  . Sexual activity: Not Currently  Other Topics Concern  . Not on file  Social History Narrative   Lives in [place] with husband. Has 4 children.      4 grandchildren.      Work - Retired from [employer]- walking the track, 4x per week      Diet- regular      Social Determinants of Radio broadcast assistant Strain: Low Risk   . Difficulty of Paying Living Expenses: Not hard at all  Food Insecurity: No Food Insecurity  . Worried About Charity fundraiser in the Last Year: Never true  . Ran Out of Food in  the Last Year: Never true  Transportation Needs: No Transportation Needs  . Lack of Transportation (Medical): No  . Lack of Transportation (Non-Medical): No  Physical Activity:   . Days of Exercise per Week: Not on file  . Minutes of Exercise per Session: Not on file  Stress: No Stress Concern Present  . Feeling of Stress : Not at all  Social Connections: Socially Integrated  . Frequency of Communication with Friends and Family: More than three times a week  . Frequency of Social Gatherings with Friends and Family: Once a week  . Attends Religious Services: More than 4 times per year  . Active Member of Clubs or Organizations: Yes  . Attends Archivist Meetings: More than 4 times per year  . Marital Status: Married    ECOG Status: 0 - Asymptomatic  Review of Systems  Review of Systems: A 12 point ROS discussed and pertinent positives are indicated in the HPI above.  All other systems are negative.  Physical Exam No direct physical exam was performed (except for noted visual exam findings with Video Visits).     Vital Signs: There were no vitals taken for this visit.  Imaging: CT GUIDE TISSUE ABLATION  Result Date: 04/29/2020 INDICATION: Left renal complex cystic mass, partially exophytic, concerning for renal cell carcinoma EXAM:  CT-GUIDED PERCUTANEOUS CRYOABLATION OF LEFT RENAL MASS CT GUIDED CORE BIOPSY LEFT RENAL MASS ANESTHESIA/SEDATION: General MEDICATIONS: Cefazolin 2 g. The antibiotic was administered in an appropriate time interval prior to needle puncture of the skin. CONTRAST:  None required PROCEDURE: The procedure, risks, benefits, and alternatives were explained to the patient. Questions regarding the procedure were encouraged and answered. The patient understands and consents to the procedure. The patient was placed under general anesthesia. Initial unenhanced CT was performed in a prone position to localize the partially exophytic cystic left renal mass. The patient was prepped with chlorhexidine in a sterile fashion, and a sterile drape was applied covering the operative field. A sterile gown and sterile gloves were used for the procedure. Under CT guidance, 3 IceRod percutaneous cryoablation probes advanced sequentially into the left renal mass. Probe positioning was confirmed by CT prior to cryoablation. Subsequently, 17 gauge trocar needle was advanced to the margin of the lesion medial aspect and coaxial 18 gauge core biopsy samples were obtained. The guide needle was removed. Cryoablation was performed through the 3 probes. Initial 10 minute cycle of cryoablation was performed. CT demonstrated good ice ball placement. This was followed by a 8 minute thaw cycle. A second 10 minute cycle of cryoablation was then performed. During ablation, periodic CT imaging was performed to monitor ice ball formation and morphology. After active thaw, the cryoablation probes were removed. Post-procedural CT was performed. COMPLICATIONS: None immediate. FINDINGS: Partially exophytic complex cystic left renal lesion was localized. No significant change in morphology since previous MR. Cryoablation positioned, core biopsy samples obtained, and cryoablation of the lesion performed without immediate complication. Small amount of intralesional  and regional retroperitoneal hemorrhage was identified postprocedure. Patient remained hemodynamically stable. IMPRESSION: 1. Technically successful CT guided percutaneous cryoablation of left renal mass. 2. CT-guided core biopsy, left renal mass. PLAN: The patient will be observed overnight. Initial follow-up will be performed in approximately 4 weeks. Electronically Signed   By: Lucrezia Europe M.D.   On: 04/29/2020 14:29   CT BIOPSY  Result Date: 04/29/2020 INDICATION: Left renal complex cystic mass, partially exophytic, concerning for renal cell carcinoma EXAM: CT-GUIDED PERCUTANEOUS CRYOABLATION OF LEFT RENAL  MASS CT GUIDED CORE BIOPSY LEFT RENAL MASS ANESTHESIA/SEDATION: General MEDICATIONS: Cefazolin 2 g. The antibiotic was administered in an appropriate time interval prior to needle puncture of the skin. CONTRAST:  None required PROCEDURE: The procedure, risks, benefits, and alternatives were explained to the patient. Questions regarding the procedure were encouraged and answered. The patient understands and consents to the procedure. The patient was placed under general anesthesia. Initial unenhanced CT was performed in a prone position to localize the partially exophytic cystic left renal mass. The patient was prepped with chlorhexidine in a sterile fashion, and a sterile drape was applied covering the operative field. A sterile gown and sterile gloves were used for the procedure. Under CT guidance, 3 IceRod percutaneous cryoablation probes advanced sequentially into the left renal mass. Probe positioning was confirmed by CT prior to cryoablation. Subsequently, 17 gauge trocar needle was advanced to the margin of the lesion medial aspect and coaxial 18 gauge core biopsy samples were obtained. The guide needle was removed. Cryoablation was performed through the 3 probes. Initial 10 minute cycle of cryoablation was performed. CT demonstrated good ice ball placement. This was followed by a 8 minute thaw cycle.  A second 10 minute cycle of cryoablation was then performed. During ablation, periodic CT imaging was performed to monitor ice ball formation and morphology. After active thaw, the cryoablation probes were removed. Post-procedural CT was performed. COMPLICATIONS: None immediate. FINDINGS: Partially exophytic complex cystic left renal lesion was localized. No significant change in morphology since previous MR. Cryoablation positioned, core biopsy samples obtained, and cryoablation of the lesion performed without immediate complication. Small amount of intralesional and regional retroperitoneal hemorrhage was identified postprocedure. Patient remained hemodynamically stable. IMPRESSION: 1. Technically successful CT guided percutaneous cryoablation of left renal mass. 2. CT-guided core biopsy, left renal mass. PLAN: The patient will be observed overnight. Initial follow-up will be performed in approximately 4 weeks. Electronically Signed   By: Lucrezia Europe M.D.   On: 04/29/2020 14:29    Labs:  CBC: Recent Labs    09/09/19 1007 03/11/20 1028 04/27/20 1112  WBC 4.6 4.5 4.8  HGB 14.4 13.9 14.2  HCT 44.9 40.4 42.9  PLT 268 273 257    COAGS: Recent Labs    04/27/20 1112  INR 1.0  APTT 26    BMP: Recent Labs    09/09/19 1007 09/09/19 1007 12/06/19 1201 03/11/20 1028 04/27/20 1112 04/30/20 0513  NA 136   < > 136 138 138 138  K 3.7   < > 3.8 4.0 4.0 4.1  CL 99   < > 100 101 102 101  CO2 29   < > 31 28 27 24   GLUCOSE 130*   < > 94 124* 82 154*  BUN 15   < > 12 14 12 15   CALCIUM 9.7   < > 9.7 9.2 9.9 8.8*  CREATININE 0.71   < > 0.70 0.83 0.62 0.66  GFRNONAA >60  --   --  >60 >60 >60  GFRAA >60  --   --  >60 >60 >60   < > = values in this interval not displayed.    LIVER FUNCTION TESTS: Recent Labs    09/09/19 1007 12/06/19 1201 03/11/20 1028  BILITOT 0.6 0.6 0.7  AST 26 25 28   ALT 24 24 26   ALKPHOS 63 60 59  PROT 7.4 7.1 7.3  ALBUMIN 4.2 4.1 4.1    TUMOR MARKERS: Recent  Labs    09/09/19 1039  CHROMGRNA 1  Assessment and Plan:  My impression is that the patient is doing very well post CT-guided cryoablation of left renal clear cell carcinoma.  She is essentially asymptomatic from the lesion and from the procedure.  I am satisfied with her progress thus far. We will plan on follow-up MR renal "protocol with contrast in about 3 months to assess the treatment site.  We discussed the importance of continued imaging surveillance for at least 5 years to  exclude any residual/recurrent neoplasm.  We discussed the anticipation of curative treatment.  She seemed to understand and did ask appropriate questions which were answered. Will set up MR with contrast in about 3 months and follow-up with her after I see those results. She knows to telephone with any questions or problems regarding the procedure in the interval if needed.  Thank you for this interesting consult.  I greatly enjoyed meeting YINA RIVIERE and look forward to participating in their care.  A copy of this report was sent to the requesting provider on this date.  Electronically Signed: Rickard Rhymes 05/26/2020, 2:33 PM   I spent a total of    25 Minutes in remote  clinical consultation, greater than 50% of which was counseling/coordinating care for left renal clear cell carcinoma, status post cryoablation.    Visit type: Audio only (telephone). Audio (no video) only due to patient's lack of internet/smartphone capability. Alternative for in-person consultation at [facility], [address], [place], [place]. This visit type was conducted due to national recommendations for restrictions regarding the COVID-19 Pandemic (e.g. social distancing).  This format is felt to be most appropriate for this patient at this time.  All issues noted in this document were discussed and addressed.   Tangney, Rose

## 2020-05-28 ENCOUNTER — Other Ambulatory Visit: Payer: Self-pay | Admitting: Family

## 2020-05-30 ENCOUNTER — Other Ambulatory Visit: Payer: Self-pay | Admitting: Family

## 2020-05-30 DIAGNOSIS — M79671 Pain in right foot: Secondary | ICD-10-CM

## 2020-05-30 DIAGNOSIS — E1140 Type 2 diabetes mellitus with diabetic neuropathy, unspecified: Secondary | ICD-10-CM

## 2020-06-06 ENCOUNTER — Other Ambulatory Visit: Payer: Self-pay | Admitting: Family

## 2020-06-06 DIAGNOSIS — M25561 Pain in right knee: Secondary | ICD-10-CM

## 2020-06-09 ENCOUNTER — Other Ambulatory Visit: Payer: Self-pay | Admitting: Family

## 2020-06-09 DIAGNOSIS — I10 Essential (primary) hypertension: Secondary | ICD-10-CM

## 2020-06-11 ENCOUNTER — Encounter: Payer: Self-pay | Admitting: Oncology

## 2020-06-11 ENCOUNTER — Inpatient Hospital Stay: Payer: Medicare Other | Attending: Oncology | Admitting: Oncology

## 2020-06-11 ENCOUNTER — Other Ambulatory Visit: Payer: Self-pay

## 2020-06-11 VITALS — BP 112/75 | HR 79 | Temp 97.6°F | Resp 18 | Wt 240.1 lb

## 2020-06-11 DIAGNOSIS — C7A8 Other malignant neuroendocrine tumors: Secondary | ICD-10-CM | POA: Diagnosis not present

## 2020-06-11 DIAGNOSIS — K760 Fatty (change of) liver, not elsewhere classified: Secondary | ICD-10-CM | POA: Diagnosis not present

## 2020-06-11 DIAGNOSIS — Z8249 Family history of ischemic heart disease and other diseases of the circulatory system: Secondary | ICD-10-CM | POA: Insufficient documentation

## 2020-06-11 DIAGNOSIS — I10 Essential (primary) hypertension: Secondary | ICD-10-CM | POA: Diagnosis not present

## 2020-06-11 DIAGNOSIS — E039 Hypothyroidism, unspecified: Secondary | ICD-10-CM | POA: Insufficient documentation

## 2020-06-11 DIAGNOSIS — M1990 Unspecified osteoarthritis, unspecified site: Secondary | ICD-10-CM | POA: Insufficient documentation

## 2020-06-11 DIAGNOSIS — E119 Type 2 diabetes mellitus without complications: Secondary | ICD-10-CM | POA: Diagnosis not present

## 2020-06-11 DIAGNOSIS — Z79899 Other long term (current) drug therapy: Secondary | ICD-10-CM | POA: Diagnosis not present

## 2020-06-11 DIAGNOSIS — Z803 Family history of malignant neoplasm of breast: Secondary | ICD-10-CM | POA: Insufficient documentation

## 2020-06-11 DIAGNOSIS — E785 Hyperlipidemia, unspecified: Secondary | ICD-10-CM | POA: Diagnosis not present

## 2020-06-11 DIAGNOSIS — Z8349 Family history of other endocrine, nutritional and metabolic diseases: Secondary | ICD-10-CM | POA: Diagnosis not present

## 2020-06-11 DIAGNOSIS — Z833 Family history of diabetes mellitus: Secondary | ICD-10-CM | POA: Insufficient documentation

## 2020-06-11 DIAGNOSIS — Z809 Family history of malignant neoplasm, unspecified: Secondary | ICD-10-CM

## 2020-06-11 DIAGNOSIS — N289 Disorder of kidney and ureter, unspecified: Secondary | ICD-10-CM | POA: Diagnosis not present

## 2020-06-11 DIAGNOSIS — Z87891 Personal history of nicotine dependence: Secondary | ICD-10-CM | POA: Insufficient documentation

## 2020-06-11 DIAGNOSIS — I2510 Atherosclerotic heart disease of native coronary artery without angina pectoris: Secondary | ICD-10-CM | POA: Diagnosis not present

## 2020-06-11 NOTE — Progress Notes (Signed)
Hematology/Oncology follow up note [facility] Telephone:([phone] Fax:([phone]   Patient Care Team: Burnard Hawthorne, FNP as PCP - General (Family Medicine) Jackolyn Confer, MD (Internal Medicine) Clent Jacks, RN as Registered Nurse  REFERRING PROVIDER: Gastroenterology: Napoleon Form. REASON FOR VISIT Follow up for management of neuroendocrine cancer of the colon and discussion about dotatate PET scan results.  HISTORY OF PRESENTING ILLNESS:  Cynthia [name] is a  74 y.o.  female with PMH listed below who was referred to me for evaluation of neuroendocrine cancer  She recently have screening colonoscopy done and was found to have 3 ascending colon polyps and 6 sigmoid colon polyps, all removed and retrieved. All three ascending colon polyps were tubular adenoma. 5 sigmoid colon polyps were hyperplastic polyps. One polyp turned out to be well defferentiated neuroendocrine tumor, 2.30mm, grade 1.   The tumor was small and the tattoo of the site of tumor removal is not feasible per GI.  Case was discussed on tumor board and recommend surveillance.  02/09/2018 dotatate PET scan was negative Patient had colonoscopy surveillance   # 1 Year colonoscopy: S/p repeat colonoscopy on 08/10/2018. Polyps resected. Pathology negative malignancy.  # She recently had a CT chest lung cancer screening.  With incidental findings of low attenuation lesion of the left kidney.  03/02/2020 ultrasound kidney showed 2.7 cm solid-appearing lesion of the left kidney which is suspicious for renal cell carcinoma. 03/09/2020 MRI abdomen with and without contrast showed complex cystic lesion arising from the upper pole of the left kidney measuring 2.1 cm.  Compatible with cystic renal cell carcinoma.  She also has right kidney lesion. Mild hepatic steatosis   INTERVAL HISTORY Cynthia [name] is a 74 y.o. female who has above history reviewed by me today presents for follow up of  neuroendocrine cancer of colon, new kidney mass Patient reports doing well. # 04/29/2020 s/p image guided left renal mass (T1 lesion) cryoablation by Dr.Hassell.  She reports doing well. No new complaints.   Review of Systems  Constitutional: Negative for chills, fever, malaise/fatigue and weight loss.  HENT: Negative for sore throat.   Eyes: Negative for redness.  Respiratory: Negative for cough, shortness of breath and wheezing.   Cardiovascular: Negative for chest pain, palpitations and leg swelling.  Gastrointestinal: Negative for abdominal pain, blood in stool, nausea and vomiting.  Genitourinary: Negative for dysuria.  Musculoskeletal: Negative for joint pain and myalgias.  Skin: Negative for rash.  Neurological: Negative for dizziness, tingling and tremors.  Endo/Heme/Allergies: Does not bruise/bleed easily.  Psychiatric/Behavioral: Negative for hallucinations.    MEDICAL HISTORY:  Past Medical History:  Diagnosis Date  . Arthritis   . Cancer (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  kidney left   . COPD (chronic obstructive pulmonary disease) ([place])   . Coronary artery disease    pt. denies  . Diabetes mellitus without complication ([place])    type 2  . GERD (gastroesophageal reflux disease)   . Hyperlipidemia   . Hypertension   . Hypothyroidism   . Lung nodule   . Thyroid disease     SURGICAL HISTORY: Past Surgical History:  Procedure Laterality Date  . BREAST BIOPSY Left 11/25/2015   stereo  neg  . BREAST EXCISIONAL BIOPSY Left yrs ago   benign  . COLONOSCOPY WITH PROPOFOL N/A 04/20/2017   Procedure: COLONOSCOPY WITH PROPOFOL;  Surgeon: [name], MD;  Location: [facility] ENDOSCOPY;  Service: Endoscopy;  Laterality: N/A;  . COLONOSCOPY WITH  PROPOFOL N/A 01/08/2018   Procedure: COLONOSCOPY WITH PROPOFOL;  Surgeon: [name], MD;  Location: [facility] ENDOSCOPY;  Service: Gastroenterology;  Laterality: N/A;  . COLONOSCOPY WITH PROPOFOL N/A 08/10/2018   Procedure: COLONOSCOPY  WITH PROPOFOL;  Surgeon: [name], MD;  Location: [facility] ENDOSCOPY;  Service: Gastroenterology;  Laterality: N/A;  . IR RADIOLOGIST EVAL & MGMT  04/08/2020  . IR RADIOLOGIST EVAL & MGMT  05/26/2020  . RADIOLOGY WITH ANESTHESIA N/A 04/29/2020   Procedure: CRYOABLATION;  Surgeon: Arne Cleveland, MD;  Location: [facility] ORS;  Service: Radiology;  Laterality: N/A;  . THYROIDECTOMY     Dr. [name]  . VAGINAL DELIVERY     4    SOCIAL HISTORY: Social History   Socioeconomic History  . Marital status: Married    Spouse name: Not on file  . Number of children: 4  . Years of education: Not on file  . Highest education level: Not on file  Occupational History  . Not on file  Tobacco Use  . Smoking status: Former Smoker    Packs/day: 0.75    Years: 48.00    Pack years: 36.00    Types: Cigarettes    Quit date: 2015    Years since quitting: 6.7  . Smokeless tobacco: Former Systems developer    Types: Snuff, Chew  Vaping Use  . Vaping Use: Never used  Substance and Sexual Activity  . Alcohol use: No  . Drug use: No  . Sexual activity: Not Currently  Other Topics Concern  . Not on file  Social History Narrative   Lives in [place] with husband. Has 4 children.      4 grandchildren.      Work - Retired from [employer]- walking the track, 4x per week      Diet- regular      Social Determinants of Radio broadcast assistant Strain: Low Risk   . Difficulty of Paying Living Expenses: Not hard at all  Food Insecurity: No Food Insecurity  . Worried About Charity fundraiser in the Last Year: Never true  . Ran Out of Food in the Last Year: Never true  Transportation Needs: No Transportation Needs  . Lack of Transportation (Medical): No  . Lack of Transportation (Non-Medical): No  Physical Activity:   . Days of Exercise per Week: Not on file  . Minutes of Exercise per Session: Not on file  Stress: No Stress Concern Present  . Feeling of Stress : Not at all  Social Connections:  Socially Integrated  . Frequency of Communication with Friends and Family: More than three times a week  . Frequency of Social Gatherings with Friends and Family: Once a week  . Attends Religious Services: More than 4 times per year  . Active Member of Clubs or Organizations: Yes  . Attends Archivist Meetings: More than 4 times per year  . Marital Status: Married  Human resources officer Violence: Not At Risk  . Fear of Current or Ex-Partner: No  . Emotionally Abused: No  . Physically Abused: No  . Sexually Abused: No    FAMILY HISTORY: Family History  Problem Relation Age of Onset  . Hypertension Mother   . Hypertension Father   . Diabetes Sister   . Thyroid disease Sister   . Breast cancer Maternal Aunt   . Breast cancer Maternal Aunt     ALLERGIES:  has No Known Allergies.  MEDICATIONS:  Current Outpatient Medications  Medication  Sig Dispense Refill  . amLODipine (NORVASC) 10 MG tablet TAKE 1 TABLET (10 MG TOTAL) BY MOUTH DAILY. 90 tablet 3  . aspirin EC 81 MG tablet Take 81 mg by mouth daily. Swallow whole.    Marland Kitchen atorvastatin (LIPITOR) 20 MG tablet TAKE 1 TABLET (20 MG TOTAL) BY MOUTH DAILY. 90 tablet 0  . Calcium Carbonate-Vit D-Min (CALCIUM 600+D PLUS MINERALS) 600-400 MG-UNIT TABS Take 1 tablet by mouth daily.     . diclofenac sodium (VOLTAREN) 1 % GEL Apply 1 application topically 4 (four) times daily as needed (knee pain.).     Marland Kitchen DULoxetine (CYMBALTA) 30 MG capsule TAKE 1 CAPSULE BY MOUTH ONCE A DAY FOR FIRST WEEK, THEN INCREASE TO 2 CAPS BY MOUTH ONCE DAILY 60 capsule 3  . glucose blood ([employer]S GLUCOSE METER TEST STRIPS) test strip TEST BLOOD SUGAR 1-2 TIMES A DAY    . glucose blood (ONE TOUCH ULTRA TEST) test strip TEST BLOOD SUGAR 1-2 TIMES A DAY 100 each 4  . Lancets (ONETOUCH DELICA PLUS [id number]) MISC USE AS INSTRUCTED 100 each 12  . levothyroxine (SYNTHROID) 137 MCG tablet Take 1 tablet (137 mcg total) by mouth daily before breakfast. 90 tablet 1  .  losartan-hydrochlorothiazide (HYZAAR) 50-12.5 MG tablet TAKE 1 TABLET BY MOUTH EVERY DAY 90 tablet 1  . meloxicam (MOBIC) 7.5 MG tablet TAKE 1 TABLET (7.5 MG TOTAL) BY MOUTH DAILY AS NEEDED FOR PAIN. TAKE WITH FOOD. 90 tablet 0  . metFORMIN (GLUCOPHAGE) 500 MG tablet TAKE 1 TABLET BY MOUTH TWICE A DAY 180 tablet 1  . metoprolol tartrate (LOPRESSOR) 25 MG tablet Take 1 tablet (25 mg total) by mouth 2 (two) times daily. 90 tablet 1  . oxyCODONE-acetaminophen (PERCOCET) 5-325 MG tablet Take 1 tablet by mouth every 6 (six) hours as needed for moderate pain or severe pain. (Patient not taking: Reported on 06/11/2020) 15 tablet 0   No current facility-administered medications for this visit.     PHYSICAL EXAMINATION: ECOG PERFORMANCE STATUS: 0 - Asymptomatic Vitals:   06/11/20 1039  BP: 112/75  Pulse: 79  Resp: 18  Temp: 97.6 F (36.4 C)   Filed Weights   06/11/20 1039  Weight: 240 lb 1.6 oz (108.9 kg)    Physical Exam Constitutional:      General: She is not in acute distress.    Appearance: She is well-developed.  HENT:     Head: Normocephalic and atraumatic.  Eyes:     General: No scleral icterus.    Conjunctiva/sclera: Conjunctivae normal.     Pupils: Pupils are equal, round, and reactive to light.  Cardiovascular:     Rate and Rhythm: Normal rate and regular rhythm.     Heart sounds: Normal heart sounds.  Pulmonary:     Effort: Pulmonary effort is normal. No respiratory distress.     Breath sounds: Normal breath sounds. No wheezing.  Abdominal:     General: Bowel sounds are normal. There is no distension.     Palpations: Abdomen is soft. There is no mass.     Tenderness: There is no abdominal tenderness.  Musculoskeletal:        General: No deformity. Normal range of motion.     Cervical back: Normal range of motion and neck supple.  Lymphadenopathy:     Cervical: No cervical adenopathy.  Skin:    General: Skin is warm and dry.     Findings: No erythema or rash.   Neurological:     Mental Status: She  is alert and oriented to person, place, and time. Mental status is at baseline.     Cranial Nerves: No cranial nerve deficit.     Coordination: Coordination normal.  Psychiatric:        Mood and Affect: Mood normal.        Thought Content: Thought content normal.      LABORATORY DATA:  I have reviewed the data as listed Lab Results  Component Value Date   WBC 4.8 04/27/2020   HGB 14.2 04/27/2020   HCT 42.9 04/27/2020   MCV 95.5 04/27/2020   PLT 257 04/27/2020   Recent Labs    09/09/19 1007 09/09/19 1007 12/06/19 1201 12/06/19 1201 03/11/20 1028 04/27/20 1112 04/30/20 0513  NA 136   < > 136   < > 138 138 138  K 3.7   < > 3.8   < > 4.0 4.0 4.1  CL 99   < > 100   < > 101 102 101  CO2 29   < > 31   < > 28 27 24   GLUCOSE 130*   < > 94   < > 124* 82 154*  BUN 15   < > 12   < > 14 12 15   CREATININE 0.71   < > 0.70   < > 0.83 0.62 0.66  CALCIUM 9.7   < > 9.7   < > 9.2 9.9 8.8*  GFRNONAA >60   < >  --   --  >60 >60 >60  GFRAA >60   < >  --   --  >60 >60 >60  PROT 7.4  --  7.1  --  7.3  --   --   ALBUMIN 4.2  --  4.1  --  4.1  --   --   AST 26  --  25  --  28  --   --   ALT 24  --  24  --  26  --   --   ALKPHOS 63  --  60  --  59  --   --   BILITOT 0.6  --  0.6  --  0.7  --   --    < > = values in this interval not displayed.    Serum serotonin level at 111 within normal range 5 HIAA urine test was obtained and patient has not gotten done yet.   ASSESSMENT & PLAN:  1. Neuroendocrine carcinoma of colon ([place])   2. Kidney lesion   3. 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  chromogranin A has been followed  10/21/2017 1 nmol/L 03/12/2019 41.6 ng/ ml 09/09/2019 1nmol/L 03/11/2020 30.7ng/ml  5 HIAA quantitative- 24h urine will be collected today. 02/15/2018   4.5 01/14/2019  3.8 09/23/2019    6 03/16/2020  7.7  #Presumed Kidney  cancer, clinical T1 lesion.  S/p cryoablation. Patient reports that she has a follow up imaged scheduled.   Former smoker, continue lung cancer screening program. Due CT chest June 2022.  Family history of breast cancer.  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   I discussed with patient about genetic testing.  Patient would like to defer   Follow-up in 4 months   all questions were answered. The patient knows to call the clinic with any problems questions or concerns.    Earlie Server, MD, PhD Hematology  [facility]- [phone] 06/11/2020

## 2020-06-11 NOTE — Progress Notes (Signed)
Patient denies new problems/concerns today.   °

## 2020-06-12 ENCOUNTER — Other Ambulatory Visit: Payer: Self-pay

## 2020-06-12 DIAGNOSIS — N2889 Other specified disorders of kidney and ureter: Secondary | ICD-10-CM | POA: Diagnosis not present

## 2020-06-13 LAB — BASIC METABOLIC PANEL
BUN/Creatinine Ratio: 16 (ref 12–28)
BUN: 10 mg/dL (ref 8–27)
CO2: 27 mmol/L (ref 20–29)
Calcium: 9.7 mg/dL (ref 8.7–10.3)
Chloride: 100 mmol/L (ref 96–106)
Creatinine, Ser: 0.63 mg/dL (ref 0.57–1.00)
GFR calc Af Amer: 102 mL/min/{1.73_m2} (ref >59)
GFR calc non Af Amer: 89 mL/min/{1.73_m2} (ref >59)
Glucose: 110 mg/dL — ABNORMAL HIGH (ref 65–99)
Potassium: 4.4 mmol/L (ref 3.5–5.2)
Sodium: 138 mmol/L (ref 134–144)

## 2020-06-16 ENCOUNTER — Other Ambulatory Visit: Payer: Self-pay

## 2020-06-16 ENCOUNTER — Ambulatory Visit (INDEPENDENT_AMBULATORY_CARE_PROVIDER_SITE_OTHER): Payer: Medicare Other | Admitting: Family

## 2020-06-16 ENCOUNTER — Encounter: Payer: Self-pay | Admitting: Family

## 2020-06-16 ENCOUNTER — Telehealth: Payer: Self-pay | Admitting: Family

## 2020-06-16 VITALS — BP 130/76 | HR 104 | Temp 98.6°F | Ht 68.0 in | Wt 240.2 lb

## 2020-06-16 DIAGNOSIS — M545 Low back pain, unspecified: Secondary | ICD-10-CM

## 2020-06-16 DIAGNOSIS — M79672 Pain in left foot: Secondary | ICD-10-CM

## 2020-06-16 DIAGNOSIS — M79671 Pain in right foot: Secondary | ICD-10-CM | POA: Diagnosis not present

## 2020-06-16 DIAGNOSIS — I10 Essential (primary) hypertension: Secondary | ICD-10-CM

## 2020-06-16 DIAGNOSIS — E785 Hyperlipidemia, unspecified: Secondary | ICD-10-CM

## 2020-06-16 DIAGNOSIS — Z23 Encounter for immunization: Secondary | ICD-10-CM | POA: Diagnosis not present

## 2020-06-16 DIAGNOSIS — Z87891 Personal history of nicotine dependence: Secondary | ICD-10-CM

## 2020-06-16 DIAGNOSIS — C642 Malignant neoplasm of left kidney, except renal pelvis: Secondary | ICD-10-CM

## 2020-06-16 DIAGNOSIS — E1140 Type 2 diabetes mellitus with diabetic neuropathy, unspecified: Secondary | ICD-10-CM

## 2020-06-16 DIAGNOSIS — E039 Hypothyroidism, unspecified: Secondary | ICD-10-CM

## 2020-06-16 LAB — MICROALBUMIN / CREATININE URINE RATIO
Creatinine,U: 146.8 mg/dL
Microalb Creat Ratio: 3.2 mg/g (ref 0.0–30.0)
Microalb, Ur: 4.8 mg/dL — ABNORMAL HIGH (ref 0.0–1.9)

## 2020-06-16 LAB — TSH: TSH: 1.02 u[IU]/mL (ref 0.35–4.50)

## 2020-06-16 LAB — LIPID PANEL
Cholesterol: 143 mg/dL (ref 0–200)
HDL: 62.3 mg/dL (ref >39.00)
LDL Cholesterol: 67 mg/dL (ref 0–99)
NonHDL: 80.31
Total CHOL/HDL Ratio: 2
Triglycerides: 69 mg/dL (ref 0.0–149.0)
VLDL: 13.8 mg/dL (ref 0.0–40.0)

## 2020-06-16 MED ORDER — DULOXETINE HCL 60 MG PO CPEP: 60.0000 mg | ORAL_CAPSULE | Freq: Every day | ORAL | 3 refills | Status: DC

## 2020-06-16 MED ORDER — LEVOTHYROXINE SODIUM 137 MCG PO TABS: 137.0000 ug | ORAL_TABLET | Freq: Every day | ORAL | 1 refills | Status: DC

## 2020-06-16 NOTE — Progress Notes (Signed)
Subjective:    Patient ID: Cynthia [name], female    DOB: 1946/07/21, 75 y.o.   MRN: [id number]  CC: Cynthia [name] is a 74 y.o. female who presents today for follow up.   HPI: Feels well today No complaints  HTN- compliant with norvasc 10mg  qd, hyzaar 50-12.5mg , lopressor 25mg . No cp, sob  DM- compliant with metformin 500mg  bid. No numbness or tinging.   Hypothyroidism- compliant with synthroid 137 mcg.   Low back pain and  Knee pain- well controlled. No falls.  compliant with cymbalta 60mg  qd. Compliant.  mobic 7.5mg  prn, usually QOD.    HLD- compliant with lipitor 20mg . No  Myalgias.        Dr Tasia Catchings last week for neuroendocrine carcinoma of the colon, presumed kidney cancer s/p left cryoblation with Dr Vernard Gambles 04/29/20 which showed renal cell carcinoma   Dr Doristine Counter note advised per Dr Vernard Gambles follow up surveillance at 3, 6 mos and then yearly for 5 years.   Due CT lung cancer screen   HISTORY:  Past Medical History:  Diagnosis Date  . Arthritis   . Cancer ([place]) 2019   cancerous pylop in april.  kidney left   . COPD (chronic obstructive pulmonary disease) ([place])   . Coronary artery disease    pt. denies  . Diabetes mellitus without complication ([place])    type 2  . GERD (gastroesophageal reflux disease)   . Hyperlipidemia   . Hypertension   . Hypothyroidism   . Lung nodule   . Thyroid disease    Past Surgical History:  Procedure Laterality Date  . BREAST BIOPSY Left 11/25/2015   stereo  neg  . BREAST EXCISIONAL BIOPSY Left yrs ago   benign  . COLONOSCOPY WITH PROPOFOL N/A 04/20/2017   Procedure: COLONOSCOPY WITH PROPOFOL;  Surgeon: [name], MD;  [facility] ENDOSCOPY;  Service: Endoscopy;  Laterality: N/A;  . COLONOSCOPY WITH PROPOFOL N/A 01/08/2018   Procedure: COLONOSCOPY WITH PROPOFOL;  Surgeon: [name], MD;  Location: [facility] ENDOSCOPY;  Service: Gastroenterology;  Laterality: N/A;  . COLONOSCOPY WITH PROPOFOL N/A 08/10/2018   Procedure:  COLONOSCOPY WITH PROPOFOL;  Surgeon: [name], MD;  Location: [facility] ENDOSCOPY;  Service: Gastroenterology;  Laterality: N/A;  . IR RADIOLOGIST EVAL & MGMT  04/08/2020  . IR RADIOLOGIST EVAL & MGMT  05/26/2020  . RADIOLOGY WITH ANESTHESIA N/A 04/29/2020   Procedure: CRYOABLATION;  Surgeon: Arne Cleveland, MD;  Location: [facility] ORS;  Service: Radiology;  Laterality: N/A;  . THYROIDECTOMY     Dr. [name]  . VAGINAL DELIVERY     4   Family History  Problem Relation Age of Onset  . Hypertension Mother   . Hypertension Father   . Diabetes Sister   . Thyroid disease Sister   . Breast cancer Maternal Aunt   . Breast cancer Maternal Aunt     Allergies: Patient has no known allergies. Current Outpatient Medications on File Prior to Visit  Medication Sig Dispense Refill  . amLODipine (NORVASC) 10 MG tablet TAKE 1 TABLET (10 MG TOTAL) BY MOUTH DAILY. 90 tablet 3  . aspirin EC 81 MG tablet Take 81 mg by mouth daily. Swallow whole.    Marland Kitchen atorvastatin (LIPITOR) 20 MG tablet TAKE 1 TABLET (20 MG TOTAL) BY MOUTH DAILY. 90 tablet 0  . Calcium Carbonate-Vit D-Min (CALCIUM 600+D PLUS MINERALS) 600-400 MG-UNIT TABS Take 1 tablet by mouth daily.     . diclofenac sodium (VOLTAREN) 1 % GEL Apply 1 application topically 4 (  four) times daily as needed (knee pain.).     Marland Kitchen glucose blood (CVS GLUCOSE METER TEST STRIPS) test strip TEST BLOOD SUGAR 1-2 TIMES A DAY    . glucose blood (ONE TOUCH ULTRA TEST) test strip TEST BLOOD SUGAR 1-2 TIMES A DAY 100 each 4  . Lancets (ONETOUCH DELICA PLUS [id number]) MISC USE AS INSTRUCTED 100 each 12  . losartan-hydrochlorothiazide (HYZAAR) 50-12.5 MG tablet TAKE 1 TABLET BY MOUTH EVERY DAY 90 tablet 1  . meloxicam (MOBIC) 7.5 MG tablet TAKE 1 TABLET (7.5 MG TOTAL) BY MOUTH DAILY AS NEEDED FOR PAIN. TAKE WITH FOOD. 90 tablet 0  . metFORMIN (GLUCOPHAGE) 500 MG tablet TAKE 1 TABLET BY MOUTH TWICE A DAY 180 tablet 1  . metoprolol tartrate (LOPRESSOR) 25 MG tablet Take 1 tablet (25 mg  total) by mouth 2 (two) times daily. 90 tablet 1   No current facility-administered medications on file prior to visit.    Social History   Tobacco Use  . Smoking status: Former Smoker    Packs/day: 0.75    Years: 48.00    Pack years: 36.00    Types: Cigarettes    Quit date: 2015    Years since quitting: 6.7  . Smokeless tobacco: Former Systems developer    Types: Snuff, Chew  Vaping Use  . Vaping Use: Never used  Substance Use Topics  . Alcohol use: No  . Drug use: No    Review of Systems  Constitutional: Negative for chills and fever.  Respiratory: Negative for cough.   Cardiovascular: Negative for chest pain and palpitations.  Gastrointestinal: Negative for nausea and vomiting.  Musculoskeletal: Positive for arthralgias (bilateral knees) and back pain (low back pain).      Objective:    BP 130/76   Pulse (!) 104   Temp 98.6 F (37 C)   Ht 5\' 8"  (1.727 m)   Wt 240 lb 3.2 oz (109 kg)   SpO2 97%   BMI 36.52 kg/m  BP Readings from Last 3 Encounters:  06/16/20 130/76  06/11/20 112/75  05/06/20 127/82   Wt Readings from Last 3 Encounters:  06/16/20 240 lb 3.2 oz (109 kg)  06/11/20 240 lb 1.6 oz (108.9 kg)  05/06/20 238 lb (108 kg)    Physical Exam Vitals reviewed.  Constitutional:      Appearance: She is well-developed.  Eyes:     Conjunctiva/sclera: Conjunctivae normal.  Cardiovascular:     Rate and Rhythm: Normal rate and regular rhythm.     Pulses: Normal pulses.     Heart sounds: Normal heart sounds.  Pulmonary:     Effort: Pulmonary effort is normal.     Breath sounds: Normal breath sounds. No wheezing, rhonchi or rales.  Skin:    General: Skin is warm and dry.  Neurological:     Mental Status: She is alert.  Psychiatric:        Speech: Speech normal.        Behavior: Behavior normal.        Thought Content: Thought content normal.        Assessment & Plan:   Problem List Items Addressed This Visit      Cardiovascular and Mediastinum    Hypertension    Well controlled. Continue regimen.        Endocrine   Hypothyroidism    Asymptomatic. Pending tsh. Continue synthroid.      Relevant Medications   levothyroxine (SYNTHROID) 137 MCG tablet   Other Relevant Orders  TSH   Type 2 diabetes mellitus with diabetic neuropathy, unspecified (HCC)    Lab Results  Component Value Date   HGBA1C 6.8 (H) 04/27/2020   Well controlled. Continue meformin.      Relevant Orders   Lipid panel   Microalbumin / creatinine urine ratio     Genitourinary   Renal carcinoma, left [facility])    s/p left cryoblation with Dr Vernard Gambles 04/29/20 which showed renal cell carcinoma  Per Dr Doristine Counter note, she will need follow up surveillance at 3, 6 mos and then yearly for 5 years. No orders seen in system nor does patient have follow up with Dr Vernard Gambles. I have sent Dr Diamantina Providence a note for clarification and what image needs to be ordered         Other   Former smoker    Due CT chest lung cancer screen and I have sent message to Faythe Casa to contact patient. Will follow      Hyperlipidemia    Presume controlled. Pending lipid panel. Continue lipitor.       Low back pain    Stable, continue cymbalta, prn mobic      Relevant Medications   DULoxetine (CYMBALTA) 60 MG capsule    Other Visit Diagnoses    Need for immunization against influenza    -  Primary   Relevant Orders   Flu Vaccine QUAD High Dose(Fluad) (Completed)   Bilateral foot pain           I have discontinued Enid Derry A. Munnerlyn's oxyCODONE-acetaminophen and DULoxetine. I am also having her start on DULoxetine. Additionally, I am having her maintain her Calcium 600+D Plus Minerals, diclofenac sodium, metoprolol tartrate, glucose blood, atorvastatin, OneTouch Delica Plus [id number], CVS Glucose Meter Test Strips, amLODipine, aspirin EC, metFORMIN, meloxicam, losartan-hydrochlorothiazide, and levothyroxine.   Meds ordered this encounter  Medications  . DULoxetine (CYMBALTA)  60 MG capsule    Sig: Take 1 capsule (60 mg total) by mouth daily.    Dispense:  90 capsule    Refill:  3    Order Specific Question:   Supervising Provider    Answer:   Deborra Medina L [[id number]]  . levothyroxine (SYNTHROID) 137 MCG tablet    Sig: Take 1 tablet (137 mcg total) by mouth daily before breakfast.    Dispense:  90 tablet    Refill:  1    Order Specific Question:   Supervising Provider    Answer:   Crecencio Mc [[id number]]    Return precautions given.   Risks, benefits, and alternatives of the medications and treatment plan prescribed today were discussed, and patient expressed understanding.   Education regarding symptom management and diagnosis given to patient on AVS.  Continue to follow with Burnard Hawthorne, FNP for routine health maintenance.   Cynthia [name] and I agreed with plan.   Mable Paris, FNP

## 2020-06-16 NOTE — Assessment & Plan Note (Signed)
Asymptomatic. Pending tsh. Continue synthroid.

## 2020-06-16 NOTE — Assessment & Plan Note (Signed)
Well controlled. Continue regimen.

## 2020-06-16 NOTE — Assessment & Plan Note (Signed)
Presume controlled. Pending lipid panel. Continue lipitor.

## 2020-06-16 NOTE — Assessment & Plan Note (Signed)
Stable, continue cymbalta, prn mobic

## 2020-06-16 NOTE — Telephone Encounter (Signed)
close

## 2020-06-16 NOTE — Assessment & Plan Note (Signed)
s/p left cryoblation with Dr Vernard Gambles 04/29/20 which showed renal cell carcinoma  Per Dr Doristine Counter note, she will need follow up surveillance at 3, 6 mos and then yearly for 5 years. No orders seen in system nor does patient have follow up with Dr Vernard Gambles. I have sent Dr Diamantina Providence a note for clarification and what image needs to be ordered

## 2020-06-16 NOTE — Assessment & Plan Note (Signed)
Lab Results  Component Value Date   HGBA1C 6.8 (H) 04/27/2020   Well controlled. Continue meformin.

## 2020-06-16 NOTE — Assessment & Plan Note (Signed)
Due CT chest lung cancer screen and I have sent message to Faythe Casa to contact patient. Will follow

## 2020-06-16 NOTE — Patient Instructions (Addendum)
Nice to see you  We will confirm with Dr Diamantina Providence in regards to repeat imaging of your left kidney.   Stay safe!

## 2020-06-19 ENCOUNTER — Other Ambulatory Visit: Payer: Self-pay | Admitting: Family

## 2020-06-19 DIAGNOSIS — R809 Proteinuria, unspecified: Secondary | ICD-10-CM

## 2020-06-22 ENCOUNTER — Telehealth: Payer: Self-pay | Admitting: Family

## 2020-06-22 NOTE — Telephone Encounter (Signed)
Please call dr Marijo File MD at [facility] radiology [phone] Per urology, dr Versie Starks, patient is to follow up with dr Vernard Gambles after left kidney after cryoblation 04/29/20  Please call and speak with his nurse and ask her to call and schedule patient for surveillance of left kidney.   Then call patient and update her that dr sninsky informed me that Dr Vernard Gambles should be following her and ordering imaging here for the 3 month and 6 month follow up.If she doesn't here from [facility] Radiology  in the next week, please call our office asap.    My notes:  Dr Diamantina Providence 06/16/20  My last note on 8/18 has the details- typically IR will follow them when they do the ablation procedure with CT or MRI at 3 and 6 months, I spoke with Vernard Gambles when I put int hat note and he said they would order the imaging and follow her

## 2020-06-24 NOTE — Telephone Encounter (Signed)
LMTCB

## 2020-06-24 NOTE — Telephone Encounter (Signed)
Called Dr. Adron Bene office & patient was 05/26/20. They are following patient & she will have MRI done in 3 months.

## 2020-06-24 NOTE — Telephone Encounter (Signed)
Please call pt and let her know as well She was confused who was following this and thought she wasn't going back to [place]; for now, she is going back to Dr Vernard Gambles at [facility] Radiology; ensure she is aware and to call us if she doesn't hear from them

## 2020-06-25 NOTE — Telephone Encounter (Signed)
LMTCB

## 2020-06-30 ENCOUNTER — Other Ambulatory Visit: Payer: Self-pay | Admitting: Family

## 2020-06-30 DIAGNOSIS — E785 Hyperlipidemia, unspecified: Secondary | ICD-10-CM

## 2020-06-30 NOTE — Telephone Encounter (Signed)
I spoke with patient & advised that Dr. Adron Bene office was following pt. I reviewed patients upcoming appointments with her & she has written down.

## 2020-07-06 ENCOUNTER — Encounter: Payer: Self-pay | Admitting: Podiatry

## 2020-07-06 ENCOUNTER — Other Ambulatory Visit: Payer: Self-pay

## 2020-07-06 ENCOUNTER — Ambulatory Visit: Payer: Medicare Other | Admitting: Podiatry

## 2020-07-06 DIAGNOSIS — E1140 Type 2 diabetes mellitus with diabetic neuropathy, unspecified: Secondary | ICD-10-CM

## 2020-07-06 DIAGNOSIS — M79675 Pain in left toe(s): Secondary | ICD-10-CM

## 2020-07-06 DIAGNOSIS — B351 Tinea unguium: Secondary | ICD-10-CM

## 2020-07-06 DIAGNOSIS — M79674 Pain in right toe(s): Secondary | ICD-10-CM

## 2020-07-06 NOTE — Progress Notes (Signed)

## 2020-07-23 DIAGNOSIS — E1122 Type 2 diabetes mellitus with diabetic chronic kidney disease: Secondary | ICD-10-CM | POA: Diagnosis not present

## 2020-07-23 DIAGNOSIS — N1831 Chronic kidney disease, stage 3a: Secondary | ICD-10-CM | POA: Diagnosis not present

## 2020-07-23 DIAGNOSIS — I10 Essential (primary) hypertension: Secondary | ICD-10-CM | POA: Diagnosis not present

## 2020-07-23 DIAGNOSIS — R8290 Unspecified abnormal findings in urine: Secondary | ICD-10-CM | POA: Diagnosis not present

## 2020-07-23 DIAGNOSIS — R809 Proteinuria, unspecified: Secondary | ICD-10-CM | POA: Diagnosis not present

## 2020-07-27 ENCOUNTER — Other Ambulatory Visit: Payer: Self-pay | Admitting: Nephrology

## 2020-07-27 DIAGNOSIS — E1122 Type 2 diabetes mellitus with diabetic chronic kidney disease: Secondary | ICD-10-CM

## 2020-07-27 DIAGNOSIS — I10 Essential (primary) hypertension: Secondary | ICD-10-CM

## 2020-07-27 DIAGNOSIS — R809 Proteinuria, unspecified: Secondary | ICD-10-CM

## 2020-07-27 DIAGNOSIS — N1831 Chronic kidney disease, stage 3a: Secondary | ICD-10-CM

## 2020-07-27 DIAGNOSIS — N186 End stage renal disease: Secondary | ICD-10-CM

## 2020-07-29 ENCOUNTER — Other Ambulatory Visit: Payer: Self-pay

## 2020-08-05 ENCOUNTER — Ambulatory Visit: Payer: Self-pay | Admitting: Urology

## 2020-08-10 ENCOUNTER — Ambulatory Visit
Admission: RE | Admit: 2020-08-10 | Discharge: 2020-08-10 | Disposition: A | Discharge status: Home / Self Care | Payer: Medicare Other | Source: Ambulatory Visit | Attending: Nephrology | Admitting: Nephrology

## 2020-08-10 ENCOUNTER — Other Ambulatory Visit: Payer: Self-pay

## 2020-08-10 DIAGNOSIS — N186 End stage renal disease: Secondary | ICD-10-CM

## 2020-08-10 DIAGNOSIS — E1122 Type 2 diabetes mellitus with diabetic chronic kidney disease: Secondary | ICD-10-CM

## 2020-08-10 DIAGNOSIS — N1831 Chronic kidney disease, stage 3a: Secondary | ICD-10-CM

## 2020-08-10 DIAGNOSIS — R809 Proteinuria, unspecified: Secondary | ICD-10-CM | POA: Diagnosis not present

## 2020-08-10 DIAGNOSIS — I10 Essential (primary) hypertension: Secondary | ICD-10-CM

## 2020-08-10 IMAGING — US US RENAL
1 series · 14 of 25 positions shown · non-contrast
Comparison: [DATE].  MRI [DATE].

CLINICAL DATA: Hypertension, proteinuria, chronic kidney disease
stage 3 a

EXAM:
RENAL / URINARY TRACT ULTRASOUND COMPLETE

[Series 1: us renal · 0.28mm/px · 14 of 36 slices shown]
[im 1/36]
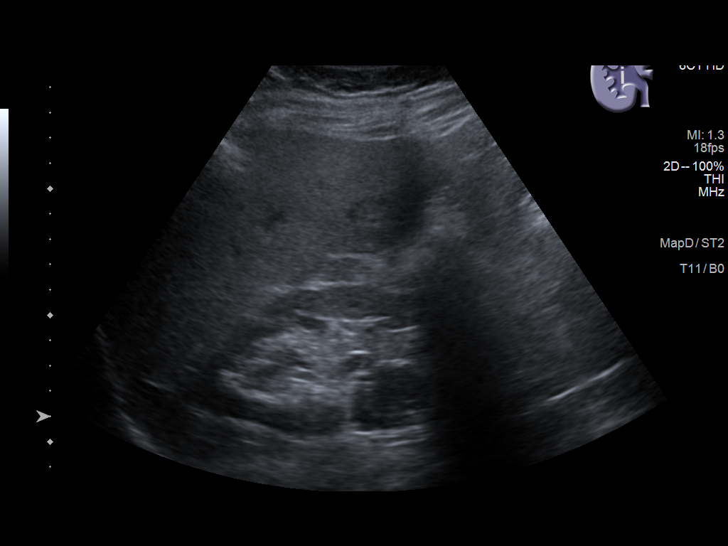
[im 3/36]
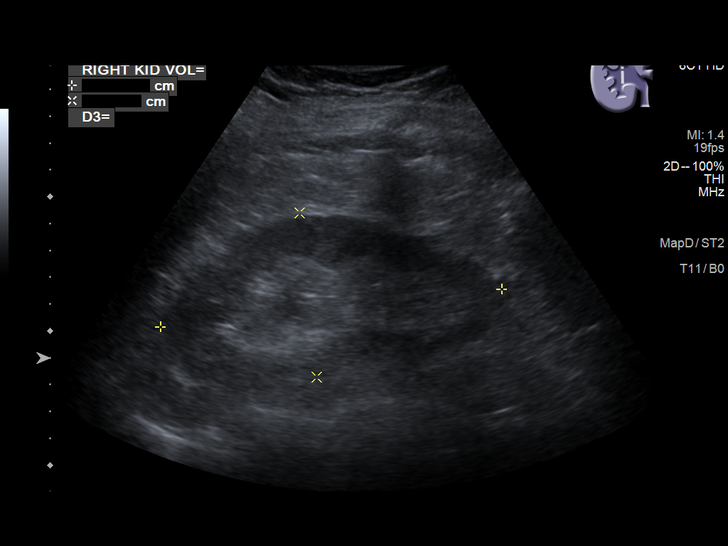
[im 6/36]
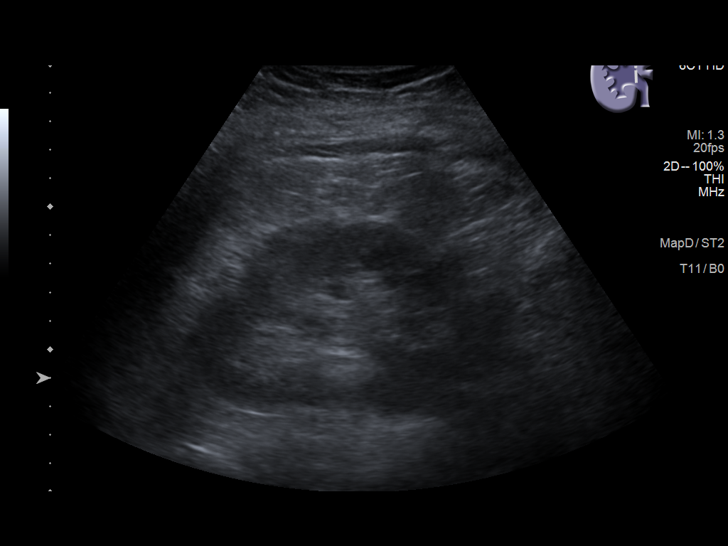
[im 9/36]
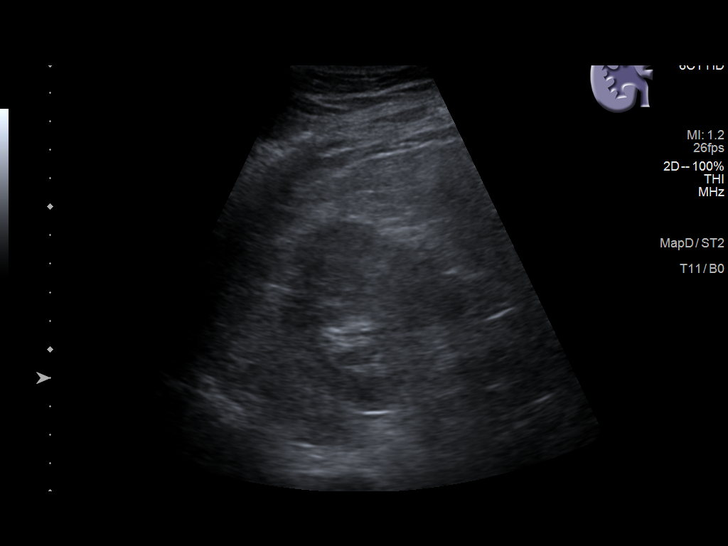
[im 12/36]
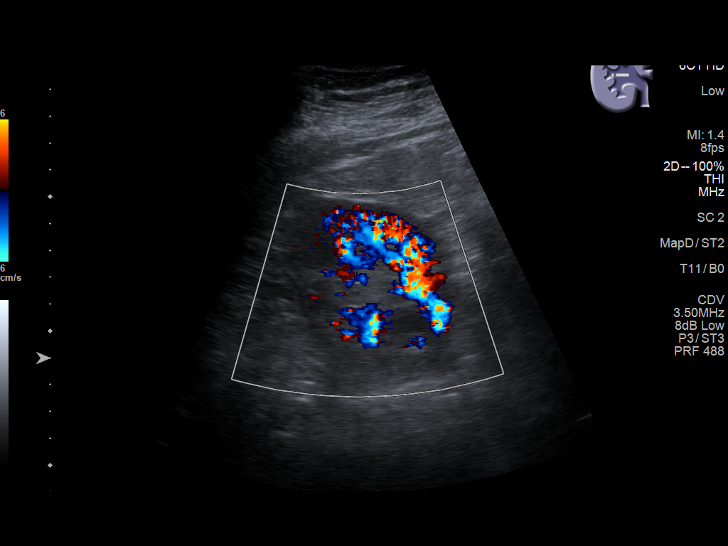
[im 14/36]
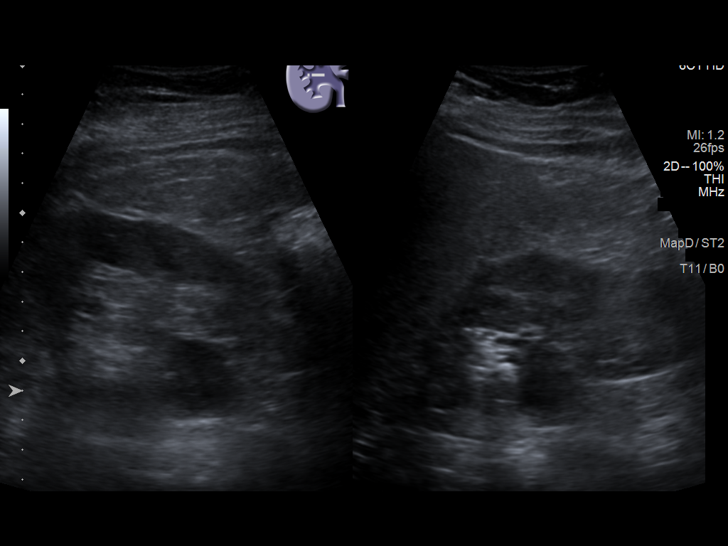
[im 17/36]
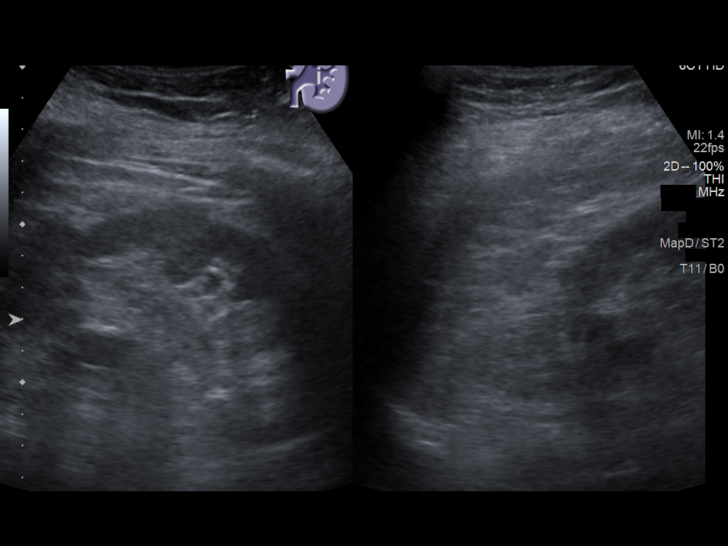
[im 19/36]
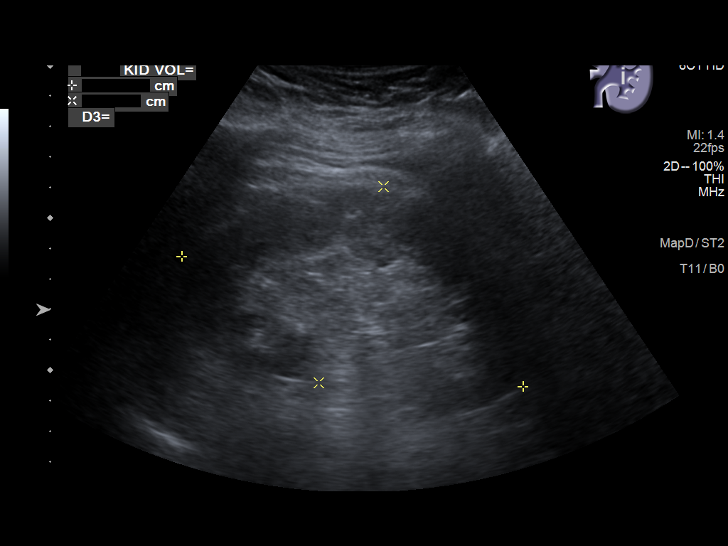
[im 22/36]
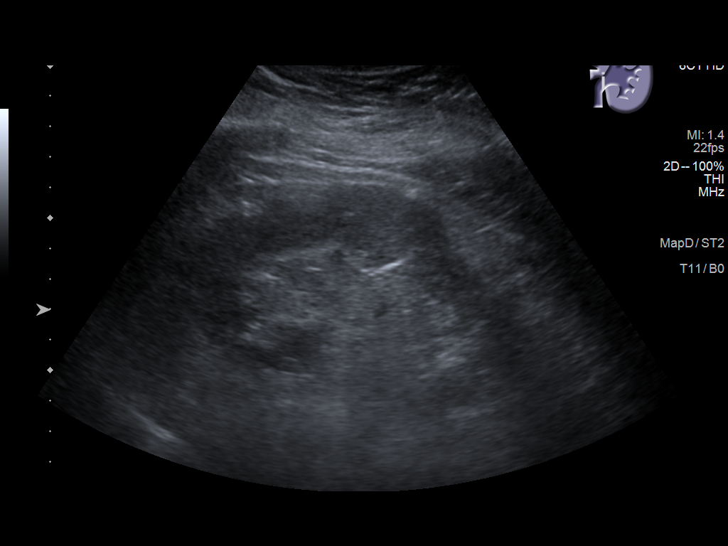
[im 24/36]
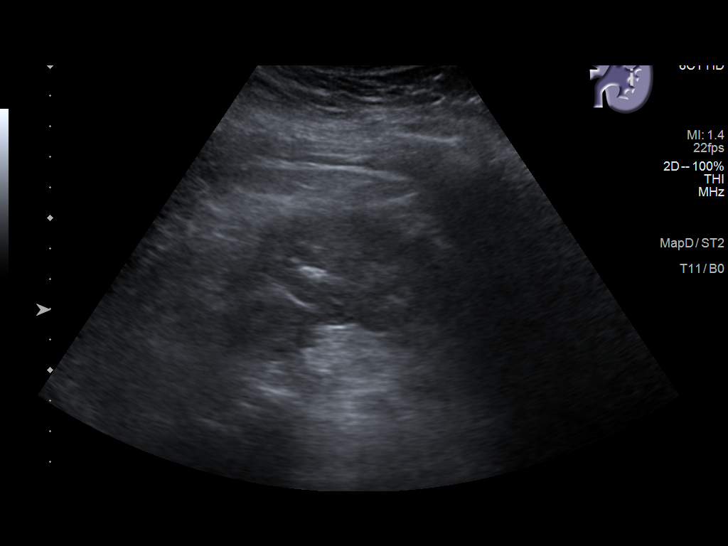
[im 27/36]
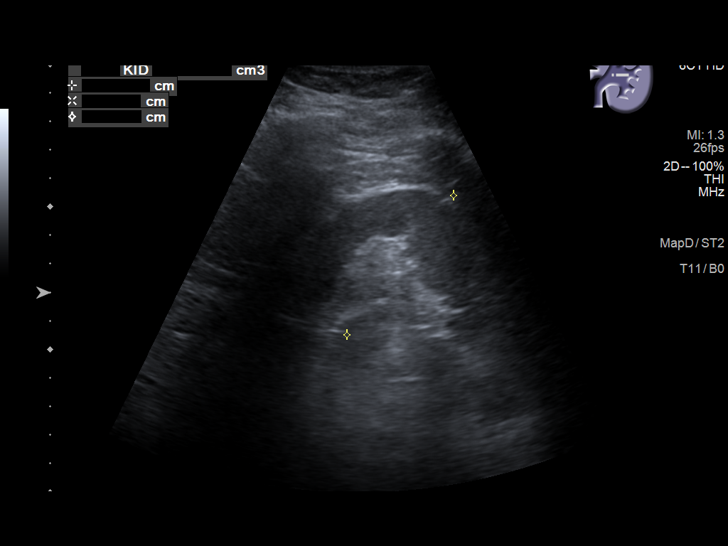
[im 30/36]
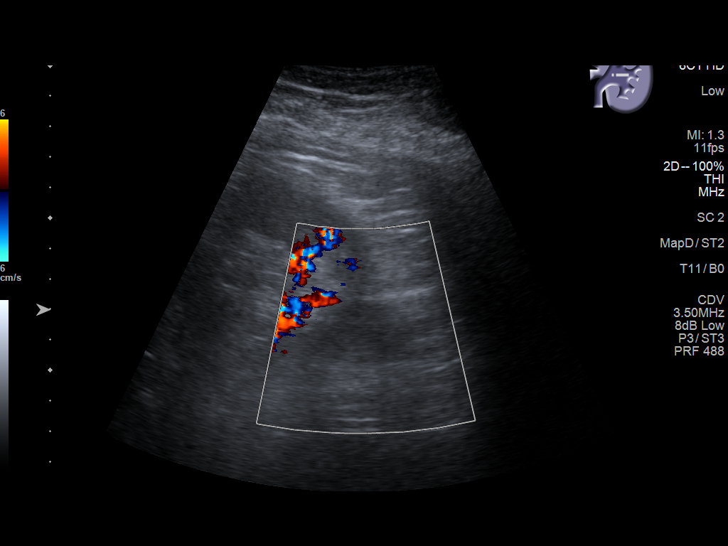
[im 33/36]
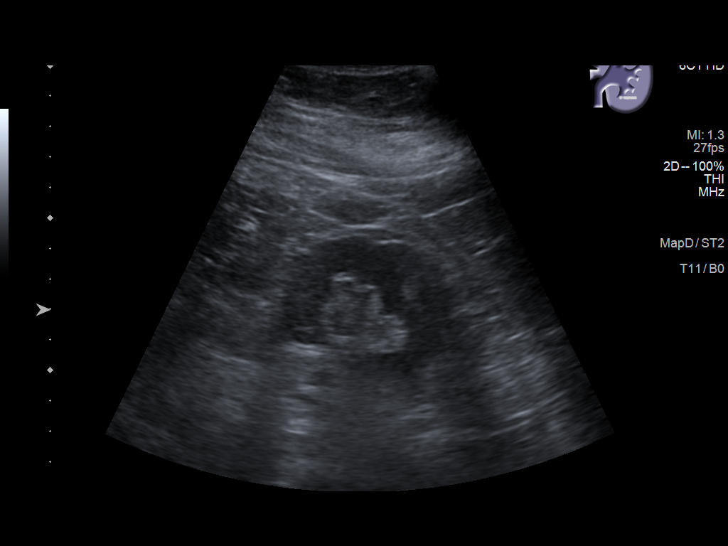
[im 36/36]
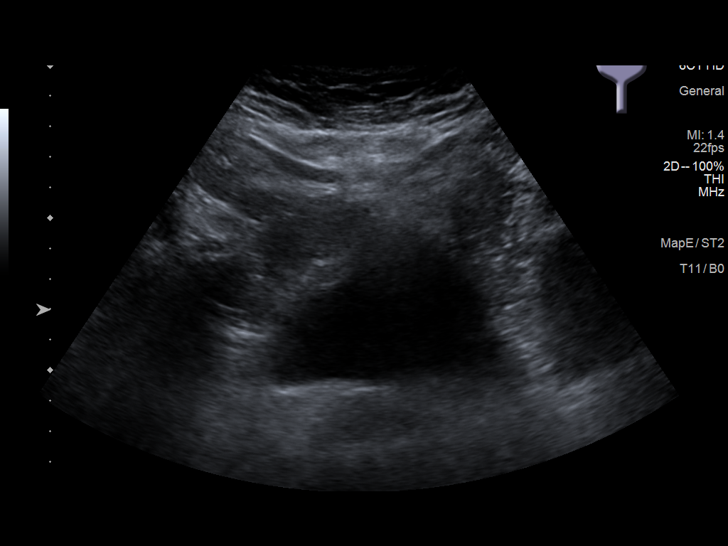

[14 of 25 positions shown; findings below may reference images not displayed]

FINDINGS: Right Kidney:

Renal measurements: 12.8 x 6.2 x 6.4 cm = volume: 262 mL. 2.4 cm
cyst in the midpole. Normal echotexture. No hydronephrosis.

Left Kidney:

Renal measurements: 12.0 x 6.8 x 6.2 cm = volume: 262 mL. 3.3 cm
hypoechoic lesion off the mid pole of the left kidney. This is not
significantly changed since prior MRI when this measured 3.2 cm

Bladder:

Appears normal for degree of bladder distention.

Other:

None
IMPRESSION: 3.3 cm hypoechoic lesion off the mid pole of the left kidney,
unchanged since prior ultrasound and MRI. Appearance remains most
compatible with renal cell carcinoma.

No hydronephrosis.

## 2020-08-20 ENCOUNTER — Other Ambulatory Visit: Payer: Self-pay

## 2020-08-20 ENCOUNTER — Telehealth: Payer: Medicare Other | Admitting: Internal Medicine

## 2020-08-20 NOTE — Progress Notes (Signed)
Called pt repeatedly and CMA tried as well no answer to VM and then spoke with pt once asked to call back on the phone shortly and pt never picked up  Dr. Olivia Mackie McLean-Scocuzza

## 2020-08-24 ENCOUNTER — Telehealth: Payer: Self-pay | Admitting: Family

## 2020-08-24 ENCOUNTER — Encounter: Payer: Self-pay | Admitting: Family

## 2020-08-24 DIAGNOSIS — J441 Chronic obstructive pulmonary disease with (acute) exacerbation: Secondary | ICD-10-CM

## 2020-08-24 MED ORDER — ALBUTEROL SULFATE HFA 108 (90 BASE) MCG/ACT IN AERS: 2.0000 | INHALATION_SPRAY | Freq: Four times a day (QID) | RESPIRATORY_TRACT | 1 refills | Status: DC | PRN

## 2020-08-24 NOTE — Telephone Encounter (Signed)
Call pt Please ensure no CP, leg swelling Is she sob just with activity?  Any increased cough, congestion?  I have sent in albuterol however please advise that she  Make an appt with [place] or urgent care if she doesn't feel immediate relief with inhaler   Former smoker

## 2020-08-24 NOTE — Telephone Encounter (Signed)
I called patient to triage. She stated that ever since Friday she has had some congestion in her chest. She says she feels short of breath after moving around. She said that she has been taking care of husband & daughter who both had surgery & she is run down too. She said that she also went out on night when it was cold & forgot her jacket. She has used the albuterol inhaler in the past that helped & wanted a refill. Patient was tested for covid :& was negative. No appointments available in our office.

## 2020-08-24 NOTE — Telephone Encounter (Signed)
noted 

## 2020-08-24 NOTE — Telephone Encounter (Signed)
Patient confirmed no CP, no leg swelling, no SOB when sitting. SOB is only upon exertion. She stated that other than congestion & SOB with activity she feels pretty except her arthritis. Pt stated that she would call if any worsening symptoms.

## 2020-08-24 NOTE — Telephone Encounter (Signed)
Patient called in wanting something for cough and congestion  Stated that she wanted an inhaler for breathing

## 2020-09-07 ENCOUNTER — Other Ambulatory Visit: Payer: Self-pay | Admitting: Interventional Radiology

## 2020-09-07 DIAGNOSIS — N2889 Other specified disorders of kidney and ureter: Secondary | ICD-10-CM

## 2020-09-15 ENCOUNTER — Ambulatory Visit: Payer: Medicare Other | Admitting: Family

## 2020-09-17 ENCOUNTER — Other Ambulatory Visit: Payer: Medicare Other

## 2020-09-18 ENCOUNTER — Other Ambulatory Visit: Payer: Self-pay | Admitting: Family

## 2020-09-18 DIAGNOSIS — E785 Hyperlipidemia, unspecified: Secondary | ICD-10-CM

## 2020-09-21 ENCOUNTER — Telehealth: Payer: Self-pay | Admitting: Family

## 2020-09-21 ENCOUNTER — Ambulatory Visit: Payer: Medicare Other | Admitting: Family

## 2020-09-21 ENCOUNTER — Other Ambulatory Visit: Payer: Self-pay

## 2020-09-21 DIAGNOSIS — E039 Hypothyroidism, unspecified: Secondary | ICD-10-CM

## 2020-09-21 MED ORDER — LEVOTHYROXINE SODIUM 137 MCG PO TABS: 137.0000 ug | ORAL_TABLET | Freq: Every day | ORAL | 1 refills | Status: DC

## 2020-09-21 NOTE — Telephone Encounter (Signed)
I have sent to [facility] for [facility].  

## 2020-09-21 NOTE — Telephone Encounter (Signed)
Pt needs a refill on levothyroxine (SYNTHROID) 137 MCG tablet sent to [facility]

## 2020-09-24 ENCOUNTER — Ambulatory Visit: Payer: Medicare Other | Admitting: Oncology

## 2020-09-25 ENCOUNTER — Other Ambulatory Visit: Payer: Self-pay

## 2020-09-25 ENCOUNTER — Ambulatory Visit
Admission: RE | Admit: 2020-09-25 | Discharge: 2020-09-25 | Disposition: A | Discharge status: Home / Self Care | Payer: Medicare Other | Source: Ambulatory Visit | Attending: Interventional Radiology | Admitting: Interventional Radiology

## 2020-09-25 DIAGNOSIS — N2889 Other specified disorders of kidney and ureter: Secondary | ICD-10-CM | POA: Diagnosis not present

## 2020-09-25 IMAGING — MR MR ABDOMEN WO/W CM
17 of 18 series · 46 of 48 positions shown · IV contrast (gadavist)
Comparison: Comparison study from [DATE].

CLINICAL DATA: Attention, protein urea and LEFT renal mass in this
74-year-old female post cryoablation in [DATE].

EXAM:
MRI ABDOMEN WITHOUT AND WITH CONTRAST
TECHNIQUE: Multiplanar multisequence MR imaging of the abdomen was performed
both before and after the administration of intravenous contrast.
CONTRAST:  10mL GADAVIST GADOBUTROL 1 MMOL/ML IV SOLN

[Series 3: T2 · coronal · 6.0mm · 1.19mm/px · 2 of 30 slices shown (1 of 2)]
[im 1/30]
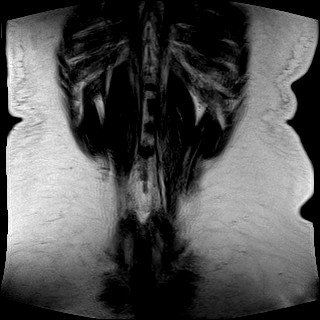
[im 30/30]
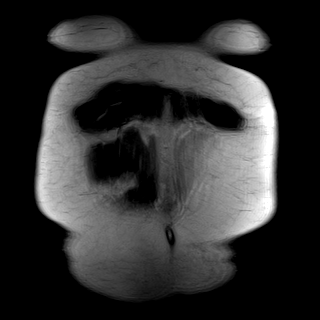

[Series 4: T2 · axial · 6.0mm · 1.19mm/px · z∈[-49,+174]mm · 2 of 32 slices shown (2 of 2)]
[im 1/32]
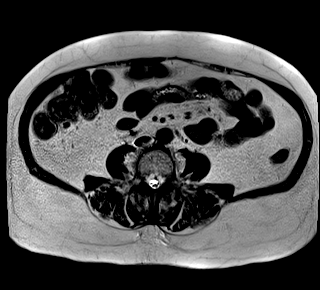
[im 32/32]
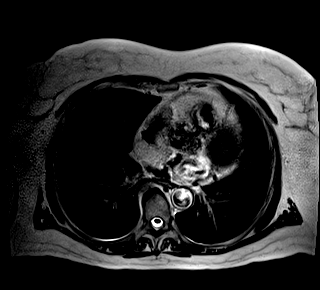

[Series 6: T2 fat-sat · axial · 6.0mm · 1.19mm/px · z∈[-57,+181]mm · 2 of 34 slices shown]
[im 1/34]
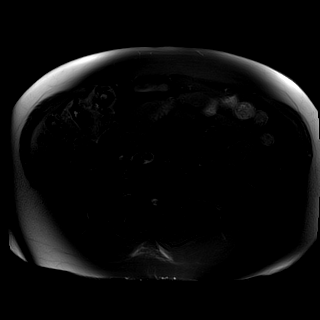
[im 34/34]
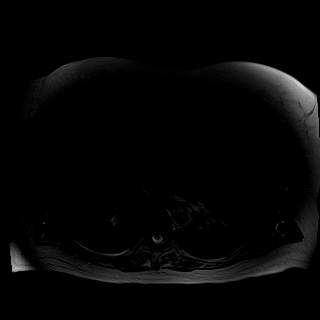

[Series 7: ax dwi_tracew · axial · 6.0mm · 1.42mm/px · z∈[-57,+181]mm · 5 of 102 slices shown]
[im 1/102]
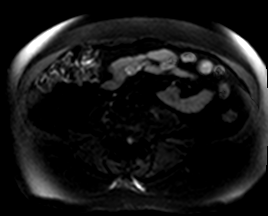
[im 26/102]
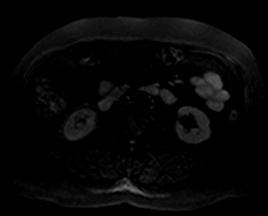
[im 51/102]
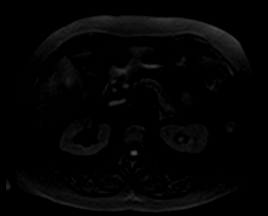
[im 76/102]
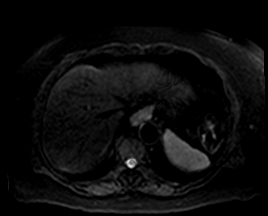
[im 102/102]
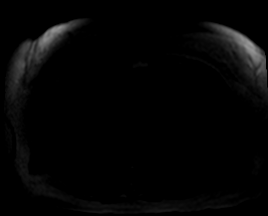

[Series 8: ax dwi_adc · axial · 6.0mm · 1.42mm/px · z∈[-57,+181]mm · 2 of 34 slices shown]
[im 1/34]
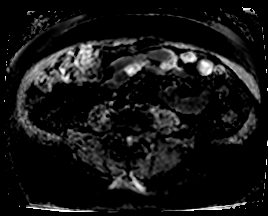
[im 34/34]
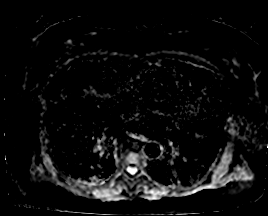

[Series 9: T1 · axial · 6.0mm · 0.74mm/px · z∈[-49,+174]mm · 2 of 32 slices shown]
[im 1/32]
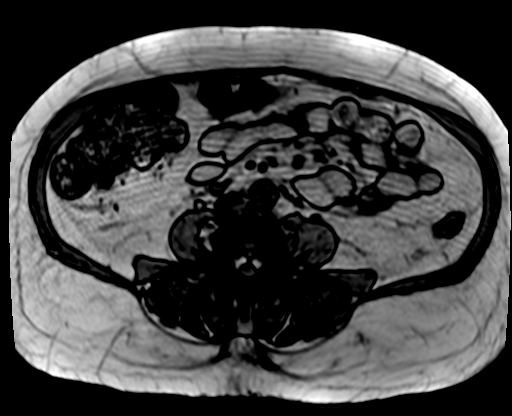
[im 32/32]
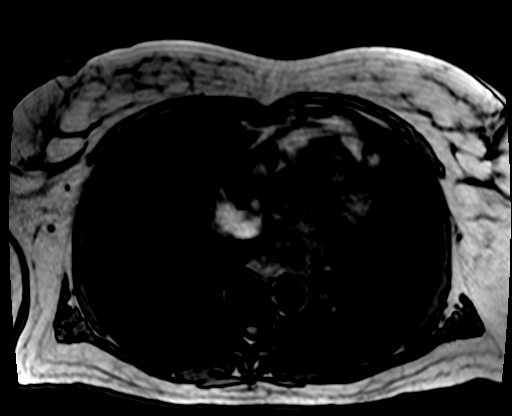

[Series 10: bSSFP · axial · 6.0mm · 0.74mm/px · z∈[-49,+174]mm · 2 of 32 slices shown]
[im 1/32]
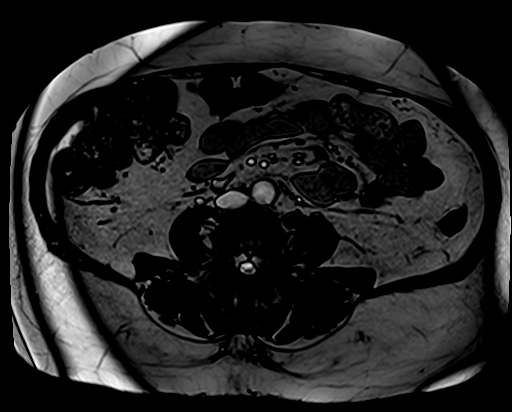
[im 32/32]
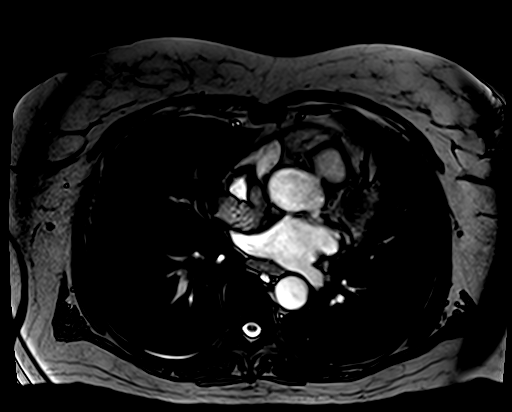

[Series 11: T1 dynamic fat-sat · axial · non-contrast · 3.0mm · 1.19mm/px · z∈[-56,+181]mm · 3 of 80 slices shown (1 of 5)]
[im 1/80]
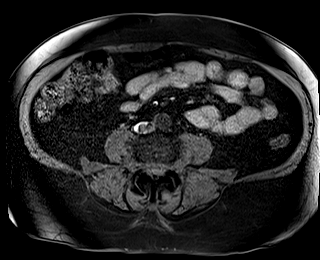
[im 40/80]
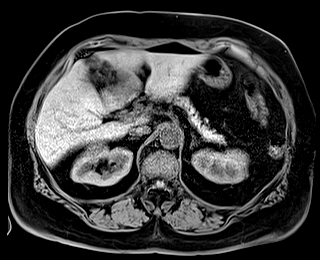
[im 80/80]
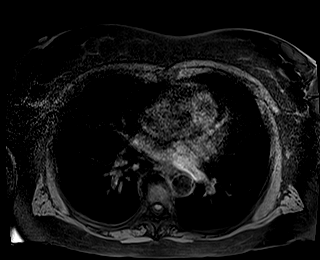

[Series 12: T1 dynamic fat-sat post-contrast · axial · 3.0mm · 1.19mm/px · z∈[-56,+181]mm · 3 of 80 slices shown (1 of 4)]
[im 1/80]
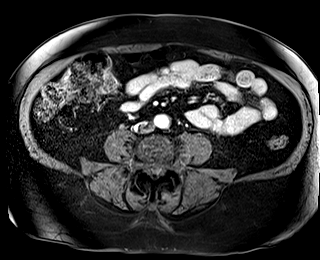
[im 40/80]
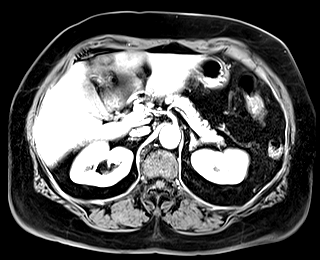
[im 80/80]
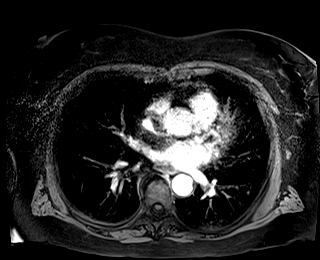

[Series 13: T1 dynamic fat-sat · axial · 3.0mm · 1.19mm/px · z∈[-56,+181]mm · 3 of 80 slices shown (2 of 5)]
[im 1/80]
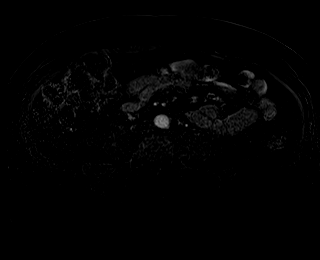
[im 40/80]
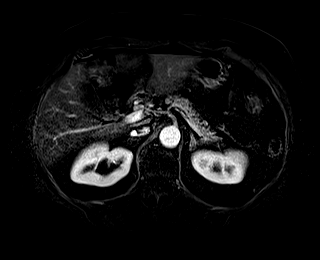
[im 80/80]
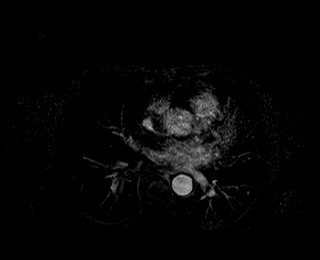

[Series 14: T1 dynamic fat-sat post-contrast · axial · 3.0mm · 1.19mm/px · z∈[-56,+181]mm · 3 of 80 slices shown (2 of 4)]
[im 1/80]
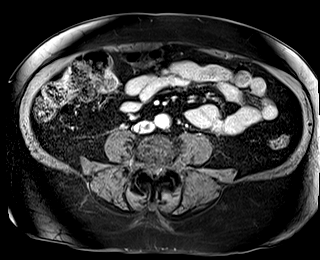
[im 40/80]
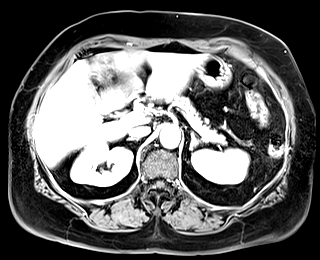
[im 80/80]
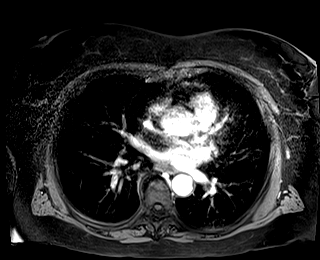

[Series 15: T1 dynamic fat-sat · axial · 3.0mm · 1.19mm/px · z∈[-56,+181]mm · 3 of 80 slices shown (3 of 5)]
[im 1/80]
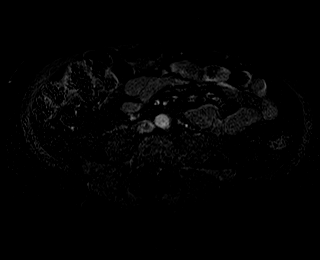
[im 40/80]
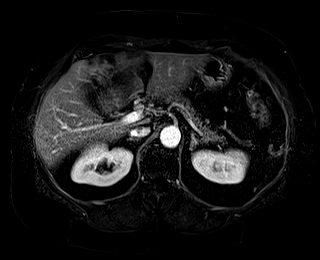
[im 80/80]
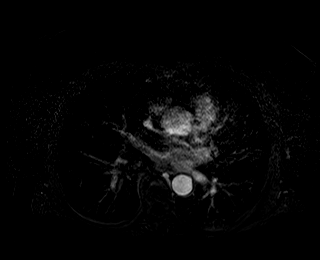

[Series 16: T1 dynamic fat-sat post-contrast · axial · 3.0mm · 1.19mm/px · z∈[-56,+181]mm · 3 of 80 slices shown (3 of 4)]
[im 1/80]
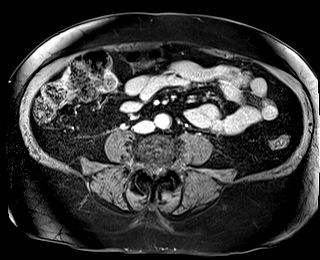
[im 40/80]
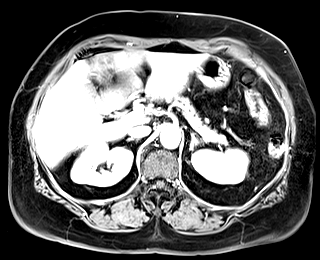
[im 80/80]
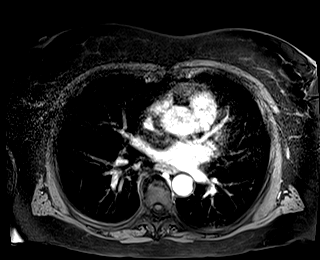

[Series 17: T1 dynamic fat-sat · axial · 3.0mm · 1.19mm/px · z∈[-56,+181]mm · 3 of 80 slices shown (4 of 5)]
[im 1/80]
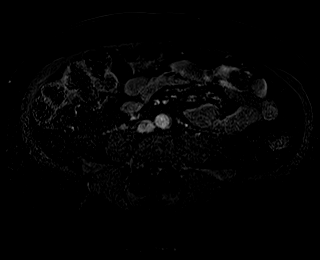
[im 40/80]
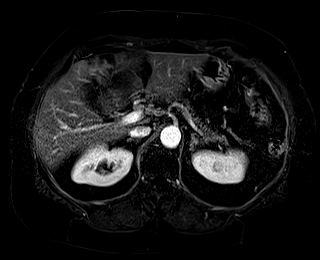
[im 80/80]
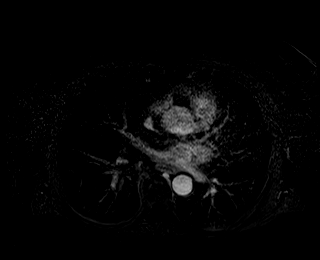

[Series 18: T1 dynamic post-contrast · coronal · 3.0mm · 1.31mm/px · 3 of 72 slices shown]
[im 1/72]
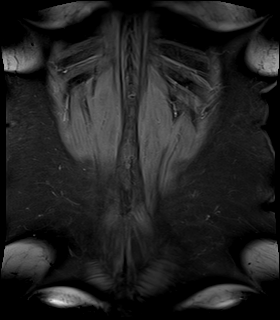
[im 36/72]
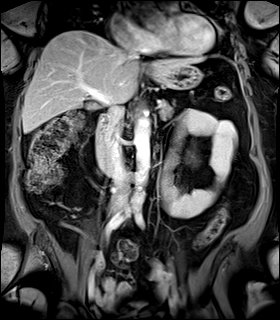
[im 72/72]
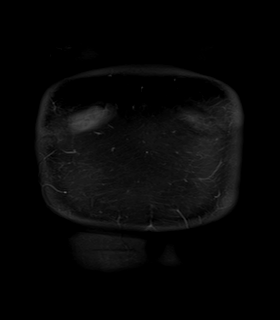

[Series 19: T1 dynamic fat-sat post-contrast · axial · 3.0mm · 1.19mm/px · z∈[-56,+181]mm · 3 of 80 slices shown (4 of 4)]
[im 1/80]
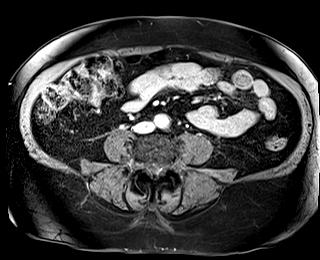
[im 40/80]
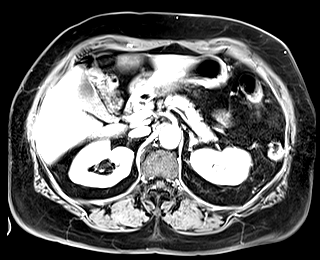
[im 80/80]
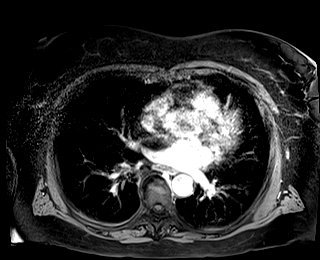

[Series 20: T1 dynamic fat-sat · axial · 3.0mm · 1.19mm/px · z∈[-56,+61]mm · 2 of 80 slices shown (5 of 5)]
[im 1/80]
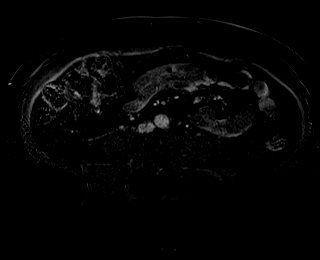
[im 40/80]
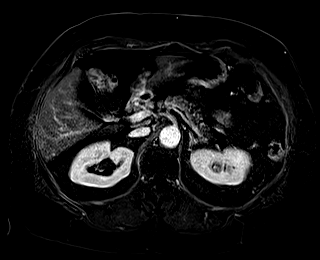

[46 of 48 positions shown; findings below may reference images not displayed]

FINDINGS: Lower chest: Incidental imaging of the lung bases with limited
assessment on MRI. No gross consolidation or sign of pleural
effusion.

Nodular area seen in the RIGHT lung base is more focal and seen in
the absence of significant atelectasis measuring approximately 13
mm. (Image 17 of series 16)

Hepatic biliary:: No focal, suspicious hepatic lesion. No
pericholecystic stranding. No biliary duct dilation.

Pancreas: Normal appearance of the pancreas without ductal dilation
or inflammation.

Spleen:  Normal spleen without focal lesion or enlargement.

Adrenals/Urinary Tract: LEFT kidney: Approximately 2.7 x 2.7 x
cm area along the medial aspect of the LEFT kidney shows baseline T1
hyperintensity and heterogeneous appearance on T2 compatible with
blood products and/or necrosis following ablation. This previously
measured approximately 3 x 3.2 x 2.8 cm. Currently there are no
signs of enhancement to suggest residual or recurrent disease at the
site of ablation along the posterior hilar lip of the LEFT kidney.

RIGHT kidney: Cyst along the medial aspect of the RIGHT kidney
without change or suspicious features.

Adrenal glands are normal.

Stomach/Bowel: No acute gastrointestinal process to the extent
evaluated

Vascular/Lymphatic: Vascular structures in the abdomen are patent.
No abdominal aortic dilation. There is no gastrohepatic or
hepatoduodenal ligament lymphadenopathy. No retroperitoneal or
mesenteric lymphadenopathy.

Other: No ascites. Ablation changes contact the upper margin of the
LEFT psoas without nodular characteristics. Retroaortic LEFT renal
vein.

Musculoskeletal: No suspicious bone lesions identified.
IMPRESSION: 1. Post ablation changes in the LEFT kidney without evidence of
residual or recurrent disease at the site of ablation.
2. Nodular area seen in the RIGHT lung base is more focal and seen
in the absence of significant atelectasis measuring approximately 13
mm. Consider dedicated CT of the chest for further evaluation. This
persists across multiple series. Correlate with any recent
respiratory symptoms. Follow-up could be performed in short
interval, 8-12 weeks, particularly if there are recent respiratory
symptoms to suggest infection.

These results will be called to the ordering clinician or
representative by the Radiologist Assistant, and communication
documented in the PACS or [REDACTED].

## 2020-09-25 MED ORDER — GADOBUTROL 1 MMOL/ML IV SOLN
10.0000 mL | Freq: Once | INTRAVENOUS | Status: AC | PRN
  Administered 2020-09-25: 10 mL via INTRAVENOUS

## 2020-09-29 ENCOUNTER — Encounter: Payer: Self-pay | Admitting: *Deleted

## 2020-09-29 ENCOUNTER — Other Ambulatory Visit (HOSPITAL_COMMUNITY): Payer: Self-pay | Admitting: Interventional Radiology

## 2020-09-29 ENCOUNTER — Other Ambulatory Visit: Payer: Self-pay

## 2020-09-29 ENCOUNTER — Ambulatory Visit
Admission: RE | Admit: 2020-09-29 | Discharge: 2020-09-29 | Disposition: A | Discharge status: Home / Self Care | Payer: Medicare Other | Source: Ambulatory Visit | Attending: Interventional Radiology | Admitting: Interventional Radiology

## 2020-09-29 DIAGNOSIS — Z9889 Other specified postprocedural states: Secondary | ICD-10-CM | POA: Diagnosis not present

## 2020-09-29 DIAGNOSIS — N2889 Other specified disorders of kidney and ureter: Secondary | ICD-10-CM

## 2020-09-29 DIAGNOSIS — C642 Malignant neoplasm of left kidney, except renal pelvis: Secondary | ICD-10-CM | POA: Diagnosis not present

## 2020-09-29 HISTORY — PX: IR RADIOLOGIST EVAL & MGMT: IMG5224

## 2020-09-29 NOTE — Progress Notes (Signed)
Patient ID: Cynthia [name], female   DOB: 14-Dec-1945, 75 y.o.   MRN: [id number]       Chief Complaint: Patient was consulted remotely today ([place]) for renal cell carcinoma, post ablation at the request of Vanshika Jastrzebski.    Referring Physician(s): Sninsky,Brian C    History of Present Illness: Cynthia [name] is a 75 y.o. female With a history of neuroendocrine colon carcinoma and pulmonary nodules. 02/05/2018 PET/CT showed no residual/recurrent disease related to her colon carcinoma.  Small low-attenuation left renal lesion is in retrospect noted. 02/19/2020 on a follow-up CT chest study, interval increase in size of left renal mass was identified 03/02/2020 renal ultrasound confirms solitary 2.7 cm left mid renal mass 03/09/2020 MR confirms 2.1 cm complex left upper pole renal lesion, partially exophytic.  No regional invasion or adenopathy. The patient is asymptomatic from  this lesion.  No renal insufficiency.  No hematuria, flank pain, weight loss.  No history of kidney cancer.  Positive history of tobacco abuse. 04/29/2020 patient underwent CT-guided core biopsy and cryoablation of the left renal mass.  Surgical  pathology consistent with clear-cell carcinoma.  She did well postprocedure and was discharged home the following day as scheduled.  Minimal discomfort at the treatment site x2 days.  No hematuria. 09/25/2020 MRI abdomen without and with contrast shows post ablation change with no residual or recurrent tumor.  No regional adenopathy.  Nodular density seen in the right lung base of the lung. She remains asymptomatic.  She lost her husband last month after his bout with cancer, but she is otherwise doing okay.     Past Medical History:  Diagnosis Date  . Arthritis   . 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 COPD (chronic obstructive pulmonary disease) ([place])   . Coronary artery disease    pt. denies  . Diabetes mellitus without complication ([place])     type 2  . GERD (gastroesophageal reflux disease)   . Hyperlipidemia   . Hypertension   . Hypothyroidism   . Lung nodule   . Thyroid disease     Past Surgical History:  Procedure Laterality Date  . BREAST BIOPSY Left 11/25/2015   stereo  neg  . BREAST EXCISIONAL BIOPSY Left yrs ago   benign  . COLONOSCOPY WITH PROPOFOL N/A 04/20/2017   Procedure: COLONOSCOPY WITH PROPOFOL;  Surgeon: [name], MD;  Location: [facility] ENDOSCOPY;  Service: Endoscopy;  Laterality: N/A;  . COLONOSCOPY WITH PROPOFOL N/A 01/08/2018   Procedure: COLONOSCOPY WITH PROPOFOL;  Surgeon: [name], MD;  Location: [facility] ENDOSCOPY;  Service: Gastroenterology;  Laterality: N/A;  . COLONOSCOPY WITH PROPOFOL N/A 08/10/2018   Procedure: COLONOSCOPY WITH PROPOFOL;  Surgeon: [name], MD;  Location: [facility] ENDOSCOPY;  Service: Gastroenterology;  Laterality: N/A;  . IR RADIOLOGIST EVAL & MGMT  04/08/2020  . IR RADIOLOGIST EVAL & MGMT  05/26/2020  . RADIOLOGY WITH ANESTHESIA N/A 04/29/2020   Procedure: CRYOABLATION;  Surgeon: Arne Cleveland, MD;  Location: [facility] ORS;  Service: Radiology;  Laterality: N/A;  . THYROIDECTOMY     Dr. [name]  . VAGINAL DELIVERY     4    Allergies: Patient has no known allergies.  Medications: Prior to Admission medications   Medication Sig Start Date End Date Taking? Authorizing Provider  albuterol (VENTOLIN HFA) 108 (90 Base) MCG/ACT inhaler Inhale 2 puffs into the lungs every 6 (six) hours as needed for wheezing or shortness of breath. 08/24/20   Burnard Hawthorne,  FNP  amLODipine (NORVASC) 10 MG tablet TAKE 1 TABLET (10 MG TOTAL) BY MOUTH DAILY. 03/13/20   Burnard Hawthorne, FNP  aspirin EC 81 MG tablet Take 81 mg by mouth daily. Swallow whole.    [provider]  atorvastatin (LIPITOR) 20 MG tablet TAKE 1 TABLET BY MOUTH EVERY DAY 09/21/20   Burnard Hawthorne, FNP  Calcium Carbonate-Vit D-Min (CALCIUM 600+D PLUS MINERALS) 600-400 MG-UNIT TABS Take 1 tablet by mouth daily.      [provider]  diclofenac sodium (VOLTAREN) 1 % GEL Apply 1 application topically 4 (four) times daily as needed (knee pain.).     [provider]  DULoxetine (CYMBALTA) 60 MG capsule Take 1 capsule (60 mg total) by mouth daily. 06/16/20   Burnard Hawthorne, FNP  glucose blood ([facility] GLUCOSE METER TEST STRIPS) test strip TEST BLOOD SUGAR 1-2 TIMES A DAY 05/25/16   [provider]  glucose blood (ONE TOUCH ULTRA TEST) test strip TEST BLOOD SUGAR 1-2 TIMES A DAY 04/17/19   Burnard Hawthorne, FNP  Lancets [facility] DELICA PLUS [id number]) [place] USE AS INSTRUCTED 02/24/20   Burnard Hawthorne, FNP  levothyroxine (SYNTHROID) 137 MCG tablet Take 1 tablet (137 mcg total) by mouth daily before breakfast. 09/21/20   Burnard Hawthorne, FNP  losartan-hydrochlorothiazide (HYZAAR) 50-12.5 MG tablet TAKE 1 TABLET BY MOUTH EVERY DAY 06/09/20   Burnard Hawthorne, FNP  meloxicam (MOBIC) 7.5 MG tablet TAKE 1 TABLET (7.5 MG TOTAL) BY MOUTH DAILY AS NEEDED FOR PAIN. TAKE WITH FOOD. 06/08/20   Burnard Hawthorne, FNP  metFORMIN (GLUCOPHAGE) 500 MG tablet TAKE 1 TABLET BY MOUTH TWICE A DAY 05/28/20   Burnard Hawthorne, FNP  metoprolol tartrate (LOPRESSOR) 25 MG tablet Take 1 tablet (25 mg total) by mouth 2 (two) times daily. 04/17/19   Burnard Hawthorne, FNP     Family History  Problem Relation Age of Onset  . Hypertension Mother   . Hypertension Father   . Diabetes Sister   . Thyroid disease Sister   . Breast cancer Maternal Aunt   . Breast cancer Maternal Aunt     Social History   Socioeconomic History  . Marital status: Married    Spouse name: Not on file  . Number of children: 4  . Years of education: Not on file  . Highest education level: Not on file  Occupational History  . Not on file  Tobacco Use  . Smoking status: Former Smoker    Packs/day: 0.75    Years: 48.00    Pack years: 36.00    Types: Cigarettes    Quit date: 2015    Years since quitting: 7.0  . Smokeless  tobacco: Former Systems developer    Types: Snuff, Chew  Vaping Use  . Vaping Use: Never used  Substance and Sexual Activity  . Alcohol use: No  . Drug use: No  . Sexual activity: Not Currently  Other Topics Concern  . Not on file  Social History Narrative   Lives in [place] with husband. Has 4 children.      4 grandchildren.      Work - Retired from [employer]- walking the track, 4x per week      Diet- regular      Social Determinants of Radio broadcast assistant Strain: Low Risk   . Difficulty of Paying Living Expenses: Not hard at all  Food Insecurity: No Food Insecurity  .  Worried About Charity fundraiser in the Last Year: Never true  . Ran Out of Food in the Last Year: Never true  Transportation Needs: No Transportation Needs  . Lack of Transportation (Medical): No  . Lack of Transportation (Non-Medical): No  Physical Activity: Not on file  Stress: No Stress Concern Present  . Feeling of Stress : Not at all  Social Connections: Socially Integrated  . Frequency of Communication with Friends and Family: More than three times a week  . Frequency of Social Gatherings with Friends and Family: Once a week  . Attends Religious Services: More than 4 times per year  . Active Member of Clubs or Organizations: Yes  . Attends Archivist Meetings: More than 4 times per year  . Marital Status: Married    ECOG Status: 0 - Asymptomatic  Review of Systems  Review of Systems: A 12 point ROS discussed and pertinent positives are indicated in the HPI above.  All other systems are negative.  Physical Exam No direct physical exam was performed (except for noted visual exam findings with Video Visits).     Vital Signs: There were no vitals taken for this visit.  Imaging: MR ABDOMEN WWO CONTRAST  Result Date: 09/25/2020 CLINICAL DATA:  Attention, protein urea and LEFT renal mass in this 75 year old female post cryoablation in August of 2021. EXAM: MRI ABDOMEN  WITHOUT AND WITH CONTRAST TECHNIQUE: Multiplanar multisequence MR imaging of the abdomen was performed both before and after the administration of intravenous contrast. CONTRAST:  36mL GADAVIST GADOBUTROL 1 MMOL/ML IV SOLN COMPARISON:  Comparison study from March 09, 2020. FINDINGS: Lower chest: Incidental imaging of the lung bases with limited assessment on MRI. No gross consolidation or sign of pleural effusion. Nodular area seen in the RIGHT lung base is more focal and seen in the absence of significant atelectasis measuring approximately 13 mm. (Image 17 of series 16) Hepatic biliary:: No focal, suspicious hepatic lesion. No pericholecystic stranding. No biliary duct dilation. Pancreas: Normal appearance of the pancreas without ductal dilation or inflammation. Spleen:  Normal spleen without focal lesion or enlargement. Adrenals/Urinary Tract: LEFT kidney: Approximately 2.7 x 2.7 x 2.8 cm area along the medial aspect of the LEFT kidney shows baseline T1 hyperintensity and heterogeneous appearance on T2 compatible with blood products and/or necrosis following ablation. This previously measured approximately 3 x 3.2 x 2.8 cm. Currently there are no signs of enhancement to suggest residual or recurrent disease at the site of ablation along the posterior hilar lip of the LEFT kidney. RIGHT kidney: Cyst along the medial aspect of the RIGHT kidney without change or suspicious features. Adrenal glands are normal. Stomach/Bowel: No acute gastrointestinal process to the extent evaluated Vascular/Lymphatic: Vascular structures in the abdomen are patent. No abdominal aortic dilation. There is no gastrohepatic or hepatoduodenal ligament lymphadenopathy. No retroperitoneal or mesenteric lymphadenopathy. Other: No ascites. Ablation changes contact the upper margin of the LEFT psoas without nodular characteristics. Retroaortic LEFT renal vein. Musculoskeletal: No suspicious bone lesions identified. IMPRESSION: 1. Post ablation  changes in the LEFT kidney without evidence of residual or recurrent disease at the site of ablation. 2. Nodular area seen in the RIGHT lung base is more focal and seen in the absence of significant atelectasis measuring approximately 13 mm. Consider dedicated CT of the chest for further evaluation. This persists across multiple series. Correlate with any recent respiratory symptoms. Follow-up could be performed in short interval, 8-12 weeks, particularly if there are recent respiratory symptoms to suggest  infection. These results will be called to the ordering clinician or representative by the Radiologist Assistant, and communication documented in the PACS or [employer]. Electronically Signed   By: Zetta Bills M.D.   On: 09/25/2020 12:59    Labs:  CBC: Recent Labs    03/11/20 1028 04/27/20 1112  WBC 4.5 4.8  HGB 13.9 14.2  HCT 40.4 42.9  PLT 273 257    COAGS: Recent Labs    04/27/20 1112  INR 1.0  APTT 26    BMP: Recent Labs    03/11/20 1028 04/27/20 1112 04/30/20 0513 06/12/20 1047  NA 138 138 138 138  K 4.0 4.0 4.1 4.4  CL 101 102 101 100  CO2 28 27 24 27   GLUCOSE 124* 82 154* 110*  BUN 14 12 15 10   CALCIUM 9.2 9.9 8.8* 9.7  CREATININE 0.83 0.62 0.66 0.63  GFRNONAA >60 >60 >60 89  GFRAA >60 >60 >60 102    LIVER FUNCTION TESTS: Recent Labs    12/06/19 1201 03/11/20 1028  BILITOT 0.6 0.7  AST 25 28  ALT 24 26  ALKPHOS 60 59  PROT 7.1 7.3  ALBUMIN 4.1 4.1    TUMOR MARKERS: No results for input(s): AFPTM, CEA, CA199, CHROMGRNA in the last 8760 hours.  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  No residual/recurrent tumor or evidence of regional adenopathy  on follow-up MRI. She is essentially asymptomatic from the lesion and from the procedure.  I am satisfied with her progress thus far.   We discussed the importance of continued imaging surveillance  for at least 5 years to  exclude any residual/recurrent neoplasm.  We discussed the anticipation of curative treatment.  She seemed to understand and did ask appropriate questions which were answered. Will set up MR with contrast in about 4-6 months and follow-up with her after I see those results. She knows to telephone with any questions or problems regarding the procedure in the interval if needed.   Thank you for this interesting consult.  I greatly enjoyed meeting Cynthia [name] and look forward to participating in their care.  A copy of this report was sent to the requesting provider on this date.  Electronically Signed: Rickard Rhymes 09/29/2020, 11:19 AM   I spent a total of    15 Minutes in remote  clinical consultation, greater than 50% of which was counseling/coordinating care for renal cell carcinoma, post ablation.    Visit type: Audio only (telephone). Audio (no video) only due to patient's lack of internet/smartphone capability. Alternative for in-person consultation at [facility], [address], [place], [place]. This visit type was conducted due to national recommendations for restrictions regarding the COVID-19 Pandemic (e.g. social distancing).  This format is felt to be most appropriate for this patient at this time.  All issues noted in this document were discussed and addressed.

## 2020-10-02 ENCOUNTER — Other Ambulatory Visit: Payer: Self-pay | Admitting: Family

## 2020-10-02 DIAGNOSIS — E1140 Type 2 diabetes mellitus with diabetic neuropathy, unspecified: Secondary | ICD-10-CM

## 2020-10-02 DIAGNOSIS — M79671 Pain in right foot: Secondary | ICD-10-CM

## 2020-10-05 ENCOUNTER — Inpatient Hospital Stay: Payer: Medicare Other | Attending: Oncology

## 2020-10-05 DIAGNOSIS — C7A8 Other malignant neuroendocrine tumors: Secondary | ICD-10-CM | POA: Insufficient documentation

## 2020-10-08 ENCOUNTER — Encounter: Payer: Self-pay | Admitting: Podiatry

## 2020-10-08 ENCOUNTER — Other Ambulatory Visit: Payer: Self-pay

## 2020-10-08 ENCOUNTER — Ambulatory Visit: Payer: Medicare Other | Admitting: Podiatry

## 2020-10-08 DIAGNOSIS — E1140 Type 2 diabetes mellitus with diabetic neuropathy, unspecified: Secondary | ICD-10-CM | POA: Diagnosis not present

## 2020-10-08 DIAGNOSIS — B351 Tinea unguium: Secondary | ICD-10-CM | POA: Diagnosis not present

## 2020-10-08 DIAGNOSIS — M79675 Pain in left toe(s): Secondary | ICD-10-CM | POA: Diagnosis not present

## 2020-10-08 DIAGNOSIS — M79674 Pain in right toe(s): Secondary | ICD-10-CM

## 2020-10-08 NOTE — Progress Notes (Signed)

## 2020-10-09 ENCOUNTER — Telehealth: Payer: Self-pay

## 2020-10-09 NOTE — Telephone Encounter (Signed)
Patient was a no show to labs on 1/17. Please cancel MD appt on 1/20.  R/s lab appt and MD 1 week after labs and call pt with new appt details.

## 2020-10-09 NOTE — Telephone Encounter (Signed)
Canceled 1/24 md appt per note.  Tried to call patient to reschedule lab and mdm appt with no answer and no vm option to leave a message.

## 2020-10-12 ENCOUNTER — Inpatient Hospital Stay: Payer: Medicare Other | Admitting: Oncology

## 2020-10-12 NOTE — Telephone Encounter (Signed)
Done... Pt was made aware of her R/S appts per Claiborne Billings.

## 2020-10-12 NOTE — Telephone Encounter (Signed)
Can you call pt today and r/s appts. Thanks

## 2020-10-15 ENCOUNTER — Other Ambulatory Visit: Payer: Self-pay

## 2020-10-15 DIAGNOSIS — C7A8 Other malignant neuroendocrine tumors: Secondary | ICD-10-CM

## 2020-10-16 ENCOUNTER — Inpatient Hospital Stay: Payer: Medicare Other

## 2020-10-16 ENCOUNTER — Other Ambulatory Visit: Payer: Self-pay

## 2020-10-16 DIAGNOSIS — C7A8 Other malignant neuroendocrine tumors: Secondary | ICD-10-CM | POA: Diagnosis not present

## 2020-10-16 LAB — COMPREHENSIVE METABOLIC PANEL
ALT: 27 U/L (ref 0–44)
AST: 29 U/L (ref 15–41)
Albumin: 3.9 g/dL (ref 3.5–5.0)
Alkaline Phosphatase: 62 U/L (ref 38–126)
Anion gap: 10 (ref 5–15)
BUN: 12 mg/dL (ref 8–23)
CO2: 28 mmol/L (ref 22–32)
Calcium: 9.4 mg/dL (ref 8.9–10.3)
Chloride: 96 mmol/L — ABNORMAL LOW (ref 98–111)
Creatinine, Ser: 0.6 mg/dL (ref 0.44–1.00)
GFR, Estimated: >60 mL/min (ref >60)
Glucose, Bld: 144 mg/dL — ABNORMAL HIGH (ref 70–99)
Potassium: 3.6 mmol/L (ref 3.5–5.1)
Sodium: 134 mmol/L — ABNORMAL LOW (ref 135–145)
Total Bilirubin: 0.7 mg/dL (ref 0.3–1.2)
Total Protein: 7.3 g/dL (ref 6.5–8.1)

## 2020-10-16 LAB — CBC WITH DIFFERENTIAL/PLATELET
Abs Immature Granulocytes: 0.01 10*3/uL (ref 0.00–0.07)
Basophils Absolute: 0.1 10*3/uL (ref 0.0–0.1)
Basophils Relative: 1 %
Eosinophils Absolute: 0.3 10*3/uL (ref 0.0–0.5)
Eosinophils Relative: 7 %
HCT: 40.8 % (ref 36.0–46.0)
Hemoglobin: 13.9 g/dL (ref 12.0–15.0)
Immature Granulocytes: 0 %
Lymphocytes Relative: 31 %
Lymphs Abs: 1.2 10*3/uL (ref 0.7–4.0)
MCH: 31.5 pg (ref 26.0–34.0)
MCHC: 34.1 g/dL (ref 30.0–36.0)
MCV: 92.5 fL (ref 80.0–100.0)
Monocytes Absolute: 0.7 10*3/uL (ref 0.1–1.0)
Monocytes Relative: 18 %
Neutro Abs: 1.7 10*3/uL (ref 1.7–7.7)
Neutrophils Relative %: 43 %
Platelets: 272 10*3/uL (ref 150–400)
RBC: 4.41 MIL/uL (ref 3.87–5.11)
RDW: 13.7 % (ref 11.5–15.5)
WBC: 3.9 10*3/uL — ABNORMAL LOW (ref 4.0–10.5)
nRBC: 0 % (ref 0.0–0.2)

## 2020-10-16 LAB — LACTATE DEHYDROGENASE: LDH: 136 U/L (ref 98–192)

## 2020-10-19 ENCOUNTER — Other Ambulatory Visit: Payer: Self-pay

## 2020-10-19 ENCOUNTER — Encounter: Payer: Self-pay | Admitting: Family

## 2020-10-19 ENCOUNTER — Ambulatory Visit (INDEPENDENT_AMBULATORY_CARE_PROVIDER_SITE_OTHER): Payer: Medicare Other | Admitting: Family

## 2020-10-19 VITALS — BP 110/76 | HR 106 | Temp 98.2°F | Ht 68.0 in | Wt 227.6 lb

## 2020-10-19 DIAGNOSIS — C642 Malignant neoplasm of left kidney, except renal pelvis: Secondary | ICD-10-CM

## 2020-10-19 DIAGNOSIS — E1140 Type 2 diabetes mellitus with diabetic neuropathy, unspecified: Secondary | ICD-10-CM

## 2020-10-19 DIAGNOSIS — I10 Essential (primary) hypertension: Secondary | ICD-10-CM

## 2020-10-19 DIAGNOSIS — R809 Proteinuria, unspecified: Secondary | ICD-10-CM

## 2020-10-19 DIAGNOSIS — M545 Low back pain, unspecified: Secondary | ICD-10-CM

## 2020-10-19 DIAGNOSIS — E785 Hyperlipidemia, unspecified: Secondary | ICD-10-CM | POA: Diagnosis not present

## 2020-10-19 LAB — POCT GLYCOSYLATED HEMOGLOBIN (HGB A1C): Hemoglobin A1C: 6.1 % — AB (ref 4.0–5.6)

## 2020-10-19 MED ORDER — DULOXETINE HCL 60 MG PO CPEP
60.0000 mg | ORAL_CAPSULE | Freq: Every day | ORAL | 3 refills | Status: DC
Start: 2020-10-19 — End: 2022-01-14

## 2020-10-19 NOTE — Patient Instructions (Addendum)
Nice to see you  Please ensure you make a follow up with Dr Lanora Manis.   Lyla Son, MD [facility] [address] [place] [address] Ph: [phone] Fax: [phone]  Schedule eye appointment with [place]:  [phone]

## 2020-10-19 NOTE — Progress Notes (Signed)
Subjective:    Patient ID: Cynthia [name], female    DOB: 1946-02-25, 75 y.o.   MRN: [id number]  CC: [name] is a 75 y.o. female who presents today for follow up.   HPI: Feels well today No complaints  HLD- compliant with metformin 500mg  BID  HTN- compliant with norvasc 10mg , hyzaar 50-12.5mg , lopressor 25mg   HLD- compliant with lipitor 20mg .     Proteinuria- Established with Dr Lanora Manis.   Left renal mass- 09/2020 MR abdomen with Dr Vernard Gambles. Post ablation changes in left kidney without evidence of residual or recurrent disease. Per patient she is planning repeat MR abdomen in 6 months.   Nodular area of right lung base. Consider dedicated CT chest  Crt 0.6 3 days ago  Over due CT lung cancer screen  HISTORY:  Past Medical History:  Diagnosis Date  . Arthritis   . 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  kidney left   . COPD (chronic obstructive pulmonary disease) ([place])   . Coronary artery disease    pt. denies  . Diabetes mellitus without complication ([place])    type 2  . GERD (gastroesophageal reflux disease)   . Hyperlipidemia   . Hypertension   . Hypothyroidism   . Lung nodule   . Thyroid disease    Past Surgical History:  Procedure Laterality Date  . BREAST BIOPSY Left 11/25/2015   stereo  neg  . BREAST EXCISIONAL BIOPSY Left yrs ago   benign  . COLONOSCOPY WITH PROPOFOL N/A 04/20/2017   Procedure: COLONOSCOPY WITH PROPOFOL;  Surgeon: [name], MD;  Location: [facility] ENDOSCOPY;  Service: Endoscopy;  Laterality: N/A;  . COLONOSCOPY WITH PROPOFOL N/A 01/08/2018   Procedure: COLONOSCOPY WITH PROPOFOL;  Surgeon: [name], MD;  Location: [facility] ENDOSCOPY;  Service: Gastroenterology;  Laterality: N/A;  . COLONOSCOPY WITH PROPOFOL N/A 08/10/2018   Procedure: COLONOSCOPY WITH PROPOFOL;  Surgeon: [name], MD;  Location: [facility] ENDOSCOPY;  Service: Gastroenterology;  Laterality: N/A;  . IR RADIOLOGIST EVAL & MGMT  04/08/2020  . IR RADIOLOGIST EVAL &  MGMT  05/26/2020  . IR RADIOLOGIST EVAL & MGMT  09/29/2020  . RADIOLOGY WITH ANESTHESIA N/A 04/29/2020   Procedure: CRYOABLATION;  Surgeon: Arne Cleveland, MD;  Location: [facility] ORS;  Service: Radiology;  Laterality: N/A;  . THYROIDECTOMY     Dr. [name]  . VAGINAL DELIVERY     4   Family History  Problem Relation Age of Onset  . Hypertension Mother   . Hypertension Father   . Diabetes Sister   . Thyroid disease Sister   . Breast cancer Maternal Aunt   . Breast cancer Maternal Aunt     Allergies: Patient has no known allergies. Current Outpatient Medications on File Prior to Visit  Medication Sig Dispense Refill  . albuterol (VENTOLIN HFA) 108 (90 Base) MCG/ACT inhaler Inhale 2 puffs into the lungs every 6 (six) hours as needed for wheezing or shortness of breath. 8 g 1  . amLODipine (NORVASC) 10 MG tablet TAKE 1 TABLET (10 MG TOTAL) BY MOUTH DAILY. 90 tablet 3  . aspirin EC 81 MG tablet Take 81 mg by mouth daily. Swallow whole.    Marland Kitchen atorvastatin (LIPITOR) 20 MG tablet TAKE 1 TABLET BY MOUTH EVERY DAY 90 tablet 0  . Calcium Carbonate-Vit D-Min (CALCIUM 600+D PLUS MINERALS) 600-400 MG-UNIT TABS Take 1 tablet by mouth daily.     . diclofenac sodium (VOLTAREN) 1 % GEL Apply 1 application topically 4 (four) times  daily as needed (knee pain.).     Marland Kitchen glucose blood ([employer] GLUCOSE METER TEST STRIPS) test strip TEST BLOOD SUGAR 1-2 TIMES A DAY    . glucose blood (ONE TOUCH ULTRA TEST) test strip TEST BLOOD SUGAR 1-2 TIMES A DAY 100 each 4  . Lancets (ONETOUCH DELICA PLUS [id number]) MISC USE AS INSTRUCTED 100 each 12  . levothyroxine (SYNTHROID) 137 MCG tablet Take 1 tablet (137 mcg total) by mouth daily before breakfast. 90 tablet 1  . losartan-hydrochlorothiazide (HYZAAR) 50-12.5 MG tablet TAKE 1 TABLET BY MOUTH EVERY DAY 90 tablet 1  . meloxicam (MOBIC) 7.5 MG tablet TAKE 1 TABLET (7.5 MG TOTAL) BY MOUTH DAILY AS NEEDED FOR PAIN. TAKE WITH FOOD. 90 tablet 0  . metFORMIN (GLUCOPHAGE) 500 MG tablet  TAKE 1 TABLET BY MOUTH TWICE A DAY 180 tablet 1  . metoprolol tartrate (LOPRESSOR) 25 MG tablet Take 1 tablet (25 mg total) by mouth 2 (two) times daily. 90 tablet 1   No current facility-administered medications on file prior to visit.    Social History   Tobacco Use  . Smoking status: Former Smoker    Packs/day: 0.75    Years: 48.00    Pack years: 36.00    Types: Cigarettes    Quit date: 2015    Years since quitting: 7.0  . Smokeless tobacco: Former Systems developer    Types: Snuff, Chew  Vaping Use  . Vaping Use: Never used  Substance Use Topics  . Alcohol use: No  . Drug use: No    Review of Systems  Constitutional: Negative for chills and fever.  Respiratory: Negative for cough.   Cardiovascular: Negative for chest pain and palpitations.  Gastrointestinal: Negative for nausea and vomiting.      Objective:    BP 110/76 (BP Location: Left Arm, Patient Position: Sitting, Cuff Size: Large)   Pulse (!) 106   Temp 98.2 F (36.8 C) (Oral)   Ht 5\' 8"  (1.727 m)   Wt 227 lb 9.6 oz (103.2 kg)   SpO2 97%   BMI 34.61 kg/m  BP Readings from Last 3 Encounters:  10/19/20 110/76  06/16/20 130/76  06/11/20 112/75   Wt Readings from Last 3 Encounters:  10/19/20 227 lb 9.6 oz (103.2 kg)  06/16/20 240 lb 3.2 oz (109 kg)  06/11/20 240 lb 1.6 oz (108.9 kg)    Physical Exam Vitals reviewed.  Constitutional:      Appearance: She is well-developed and well-nourished.  Eyes:     Conjunctiva/sclera: Conjunctivae normal.  Cardiovascular:     Rate and Rhythm: Normal rate and regular rhythm.     Pulses: Normal pulses.     Heart sounds: Normal heart sounds.  Pulmonary:     Effort: Pulmonary effort is normal.     Breath sounds: Normal breath sounds. No wheezing, rhonchi or rales.  Skin:    General: Skin is warm and dry.  Neurological:     Mental Status: She is alert.  Psychiatric:        Mood and Affect: Mood and affect normal.        Speech: Speech normal.        Behavior: Behavior  normal.        Thought Content: Thought content normal.        Assessment & Plan:   Problem List Items Addressed This Visit      Cardiovascular and Mediastinum   Hypertension    Stable. Continue norvasc 10mg , hyzaar 50-12.5mg , lopressor 25mg   Endocrine   Type 2 diabetes mellitus with diabetic neuropathy, unspecified ([place]) - Primary    Lab Results  Component Value Date   HGBA1C 6.1 (A) 10/19/2020   Controlled continue metformin 500mg  bid      Relevant Orders   POCT HgB A1C (Completed)     Genitourinary   Renal carcinoma, left ([place])    09/2020 MR abdomen with Dr Vernard Gambles. Post ablation changes in left kidney without evidence of residual or recurrent disease. Per patient she is planning repeat MR abdomen in 6 months.         Other   Hyperlipidemia    Controlled. Continue lipitor 20mg       Low back pain   Relevant Medications   DULoxetine (CYMBALTA) 60 MG capsule   Proteinuria       I am having Malaiya A. Wittner maintain her Calcium 600+D Plus Minerals, diclofenac sodium, metoprolol tartrate, glucose blood, OneTouch Delica Plus [id number], CVS Glucose Meter Test Strips, amLODipine, aspirin EC, metFORMIN, meloxicam, losartan-hydrochlorothiazide, albuterol, atorvastatin, levothyroxine, and DULoxetine.   Meds ordered this encounter  Medications  . DULoxetine (CYMBALTA) 60 MG capsule    Sig: Take 1 capsule (60 mg total) by mouth daily.    Dispense:  90 capsule    Refill:  3    Patient does NOT want 30mg  tablet.    Order Specific Question:   Supervising Provider    Answer:   Crecencio Mc [[id number]]    Return precautions given.   Risks, benefits, and alternatives of the medications and treatment plan prescribed today were discussed, and patient expressed understanding.   Education regarding symptom management and diagnosis given to patient on AVS.  Continue to follow with Burnard Hawthorne, FNP for routine health maintenance.   Cynthia [name] and I  agreed with plan.   Mable Paris, FNP

## 2020-10-20 ENCOUNTER — Telehealth: Payer: Self-pay | Admitting: Family

## 2020-10-20 DIAGNOSIS — R809 Proteinuria, unspecified: Secondary | ICD-10-CM | POA: Insufficient documentation

## 2020-10-20 DIAGNOSIS — R911 Solitary pulmonary nodule: Secondary | ICD-10-CM

## 2020-10-20 NOTE — Telephone Encounter (Signed)
Recent  09/2020 MR abdomen with Dr Vernard Gambles showed Nodular area of right lung base. Consider dedicated CT chest  Can we do CT chest earlier than due 02/2021 ? Should I order?

## 2020-10-20 NOTE — Assessment & Plan Note (Signed)
Lab Results  Component Value Date   HGBA1C 6.1 (A) 10/19/2020   Controlled continue metformin 500mg  bid

## 2020-10-20 NOTE — Assessment & Plan Note (Signed)
Stable. Continue norvasc 10mg , hyzaar 50-12.5mg , lopressor 25mg 

## 2020-10-20 NOTE — Telephone Encounter (Signed)
She is not due for annual lung screening scan until 02/18/21. I would order a dedicated CT chest without contrast to follow up on nodular areas seen on MRI.  Thanks

## 2020-10-20 NOTE — Assessment & Plan Note (Signed)
09/2020 MR abdomen with Dr Vernard Gambles. Post ablation changes in left kidney without evidence of residual or recurrent disease. Per patient she is planning repeat MR abdomen in 6 months.

## 2020-10-20 NOTE — Assessment & Plan Note (Signed)
Controlled. Continue lipitor 20mg 

## 2020-10-22 LAB — CHROMOGRANIN A: Chromogranin A (ng/mL): 31.3 ng/mL (ref 0.0–101.8)

## 2020-10-23 ENCOUNTER — Encounter: Payer: Self-pay | Admitting: Oncology

## 2020-10-23 ENCOUNTER — Inpatient Hospital Stay: Payer: Medicare Other | Attending: Oncology | Admitting: Oncology

## 2020-10-23 ENCOUNTER — Other Ambulatory Visit: Payer: Self-pay

## 2020-10-23 VITALS — BP 102/67 | HR 76 | Temp 96.8°F | Resp 16 | Wt 229.8 lb

## 2020-10-23 DIAGNOSIS — Z809 Family history of malignant neoplasm, unspecified: Secondary | ICD-10-CM | POA: Diagnosis not present

## 2020-10-23 DIAGNOSIS — K760 Fatty (change of) liver, not elsewhere classified: Secondary | ICD-10-CM | POA: Diagnosis not present

## 2020-10-23 DIAGNOSIS — Z8349 Family history of other endocrine, nutritional and metabolic diseases: Secondary | ICD-10-CM | POA: Diagnosis not present

## 2020-10-23 DIAGNOSIS — E039 Hypothyroidism, unspecified: Secondary | ICD-10-CM | POA: Diagnosis not present

## 2020-10-23 DIAGNOSIS — C7A8 Other malignant neuroendocrine tumors: Secondary | ICD-10-CM | POA: Diagnosis not present

## 2020-10-23 DIAGNOSIS — Z833 Family history of diabetes mellitus: Secondary | ICD-10-CM | POA: Diagnosis not present

## 2020-10-23 DIAGNOSIS — Z803 Family history of malignant neoplasm of breast: Secondary | ICD-10-CM | POA: Insufficient documentation

## 2020-10-23 DIAGNOSIS — E785 Hyperlipidemia, unspecified: Secondary | ICD-10-CM | POA: Insufficient documentation

## 2020-10-23 DIAGNOSIS — Z87891 Personal history of nicotine dependence: Secondary | ICD-10-CM | POA: Diagnosis not present

## 2020-10-23 DIAGNOSIS — Z79899 Other long term (current) drug therapy: Secondary | ICD-10-CM | POA: Diagnosis not present

## 2020-10-23 DIAGNOSIS — J449 Chronic obstructive pulmonary disease, unspecified: Secondary | ICD-10-CM | POA: Insufficient documentation

## 2020-10-23 DIAGNOSIS — I10 Essential (primary) hypertension: Secondary | ICD-10-CM | POA: Diagnosis not present

## 2020-10-23 DIAGNOSIS — K219 Gastro-esophageal reflux disease without esophagitis: Secondary | ICD-10-CM | POA: Insufficient documentation

## 2020-10-23 DIAGNOSIS — I2510 Atherosclerotic heart disease of native coronary artery without angina pectoris: Secondary | ICD-10-CM | POA: Diagnosis not present

## 2020-10-23 DIAGNOSIS — N289 Disorder of kidney and ureter, unspecified: Secondary | ICD-10-CM | POA: Diagnosis not present

## 2020-10-23 DIAGNOSIS — Z8249 Family history of ischemic heart disease and other diseases of the circulatory system: Secondary | ICD-10-CM | POA: Diagnosis not present

## 2020-10-23 DIAGNOSIS — E119 Type 2 diabetes mellitus without complications: Secondary | ICD-10-CM | POA: Insufficient documentation

## 2020-10-23 DIAGNOSIS — M1990 Unspecified osteoarthritis, unspecified site: Secondary | ICD-10-CM | POA: Insufficient documentation

## 2020-10-23 NOTE — Progress Notes (Signed)
Hematology/Oncology follow up note [facility] [phone] [phone]   Patient Care Team: Burnard Hawthorne, FNP as PCP - General (Family Medicine) Jackolyn Confer, MD (Internal Medicine) Clent Jacks, RN as Registered Nurse Earlie Server, MD as Consulting Physician (Hematology and Oncology)  REFERRING PROVIDER: Gastroenterology: Napoleon Form. REASON FOR VISIT Follow up for management of neuroendocrine cancer of the colon and discussion about dotatate PET scan results.  HISTORY OF PRESENTING ILLNESS:  Cynthia [name] is a  75 y.o.  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 All three ascending colon polyps were tubular adenoma. 5 sigmoid colon polyps were hyperplastic polyps. One polyp turned out to be well defferentiated neuroendocrine tumor, 2.21mm, grade 1.   The tumor was small and the tattoo of the site of tumor removal is not feasible per GI.  Case was discussed on tumor board and recommend surveillance.  02/09/2018 dotatate PET scan was negative Patient had colonoscopy surveillance   # S/p repeat colonoscopy on 08/10/2018. Polyps resected. Pathology negative malignancy.  # She recently had a CT chest lung cancer screening.  With incidental findings of low attenuation lesion of the left kidney.  03/02/2020 ultrasound kidney showed 2.7 cm solid-appearing lesion of the left kidney which is suspicious for renal cell carcinoma. 03/09/2020 MRI abdomen with and without contrast showed complex cystic lesion arising from the upper pole of the left kidney measuring 2.1 cm.  Compatible with cystic renal cell carcinoma.  She also has right kidney lesion. Mild hepatic steatosis  # 04/29/2020 s/p image guided left renal mass (T1 lesion)  cryoablation by Dr.Hassell.  INTERVAL HISTORY Cynthia [name] is a 75 y.o. female who has above history reviewed by me today presents for follow up of neuroendocrine cancer of colon, new kidney mass Patient and her husband in December 2021.  She reports coping well sofar. 0.7 MRI abdomen with and without contrast showed post ablation changes in the left kidney without evidence of residual at the site of ablation.  Nodular area seen in the right lung base is more focal seen the absence of significant atelectasis measuring approximately 52mm .  Patient denies any constitutional symptoms.  Appetite is fair.  Review of Systems  Constitutional: Negative for chills, fever, malaise/fatigue and weight loss.  HENT: Negative for sore throat.   Eyes: Negative for redness.  Respiratory: Negative for cough, shortness of breath and wheezing.   Cardiovascular: Negative for chest pain, palpitations and leg swelling.  Gastrointestinal: Negative for abdominal pain, blood in stool, nausea and vomiting.  Genitourinary: Negative for dysuria.  Musculoskeletal: Negative for joint pain and myalgias.  Skin: Negative for rash.  Neurological: Negative for dizziness, tingling and tremors.  Endo/Heme/Allergies: Does not bruise/bleed easily.  Psychiatric/Behavioral: Negative for hallucinations.    MEDICAL HISTORY:  Past Medical History:  Diagnosis Date  . Arthritis   . Cancer ([place]) 2019   cancerous pylop in april.  kidney left   . COPD (chronic obstructive pulmonary disease) ([place])   . Coronary artery disease    pt. denies  . Diabetes mellitus without complication ([place])    type 2  . GERD (gastroesophageal reflux disease)   . Hyperlipidemia   . Hypertension   . Hypothyroidism   . Lung nodule   . Thyroid disease  SURGICAL HISTORY: Past Surgical History:  Procedure Laterality Date  . BREAST BIOPSY Left 11/25/2015   stereo  neg  . BREAST EXCISIONAL BIOPSY Left yrs ago   benign  . COLONOSCOPY WITH  PROPOFOL N/A 04/20/2017   Procedure: COLONOSCOPY WITH PROPOFOL;  Surgeon: [name], MD;  Location: [facility] ENDOSCOPY;  Service: Endoscopy;  Laterality: N/A;  . COLONOSCOPY WITH PROPOFOL N/A 01/08/2018   Procedure: COLONOSCOPY WITH PROPOFOL;  Surgeon: [name], MD;  Location: [facility] ENDOSCOPY;  Service: Gastroenterology;  Laterality: N/A;  . COLONOSCOPY WITH PROPOFOL N/A 08/10/2018   Procedure: COLONOSCOPY WITH PROPOFOL;  Surgeon: [name], MD;  Location: [facility] ENDOSCOPY;  Service: Gastroenterology;  Laterality: N/A;  . IR RADIOLOGIST EVAL & MGMT  04/08/2020  . IR RADIOLOGIST EVAL & MGMT  05/26/2020  . IR RADIOLOGIST EVAL & MGMT  09/29/2020  . RADIOLOGY WITH ANESTHESIA N/A 04/29/2020   Procedure: CRYOABLATION;  Surgeon: Arne Cleveland, MD;  Location: [facility] ORS;  Service: Radiology;  Laterality: N/A;  . THYROIDECTOMY     Dr. [name]  . VAGINAL DELIVERY     4    SOCIAL HISTORY: Social History   Socioeconomic History  . Marital status: Married    Spouse name: Not on file  . Number of children: 4  . Years of education: Not on file  . Highest education level: Not on file  Occupational History  . Not on file  Tobacco Use  . Smoking status: Former Smoker    Packs/day: 0.75    Years: 48.00    Pack years: 36.00    Types: Cigarettes    Quit date: 2015    Years since quitting: 7.0  . Smokeless tobacco: Former Systems developer    Types: Snuff, Chew  Vaping Use  . Vaping Use: Never used  Substance and Sexual Activity  . Alcohol use: No  . Drug use: No  . Sexual activity: Not Currently  Other Topics Concern  . Not on file  Social History Narrative   Lives in [place] with husband. Has 4 children.      4 grandchildren.      Work - Retired from [employer]- walking the track, 4x per week      Diet- regular      Social Determinants of Radio broadcast assistant Strain: Low Risk   . Difficulty of Paying Living Expenses: Not hard at all  Food Insecurity: No Food Insecurity   . Worried About Charity fundraiser in the Last Year: Never true  . Ran Out of Food in the Last Year: Never true  Transportation Needs: No Transportation Needs  . Lack of Transportation (Medical): No  . Lack of Transportation (Non-Medical): No  Physical Activity: Not on file  Stress: No Stress Concern Present  . Feeling of Stress : Not at all  Social Connections: Socially Integrated  . Frequency of Communication with Friends and Family: More than three times a week  . Frequency of Social Gatherings with Friends and Family: Once a week  . Attends Religious Services: More than 4 times per year  . Active Member of Clubs or Organizations: Yes  . Attends Archivist Meetings: More than 4 times per year  . Marital Status: Married  Human resources officer Violence: Not At Risk  . Fear of Current or Ex-Partner: No  . Emotionally Abused: No  . Physically Abused: No  . Sexually Abused: No    FAMILY HISTORY: Family History  Problem Relation Age  of Onset  . Hypertension Mother   . Hypertension Father   . Diabetes Sister   . Thyroid disease Sister   . Breast cancer Maternal Aunt   . Breast cancer Maternal Aunt     ALLERGIES:  has No Known Allergies.  MEDICATIONS:  Current Outpatient Medications  Medication Sig Dispense Refill  . albuterol (VENTOLIN HFA) 108 (90 Base) MCG/ACT inhaler Inhale 2 puffs into the lungs every 6 (six) hours as needed for wheezing or shortness of breath. 8 g 1  . amLODipine (NORVASC) 10 MG tablet TAKE 1 TABLET (10 MG TOTAL) BY MOUTH DAILY. 90 tablet 3  . aspirin EC 81 MG tablet Take 81 mg by mouth daily. Swallow whole.    Marland Kitchen atorvastatin (LIPITOR) 20 MG tablet TAKE 1 TABLET BY MOUTH EVERY DAY 90 tablet 0  . Calcium Carbonate-Vit D-Min (CALCIUM 600+D PLUS MINERALS) 600-400 MG-UNIT TABS Take 1 tablet by mouth daily.     . diclofenac sodium (VOLTAREN) 1 % GEL Apply 1 application topically 4 (four) times daily as needed (knee pain.).     Marland Kitchen DULoxetine (CYMBALTA)  60 MG capsule Take 1 capsule (60 mg total) by mouth daily. 90 capsule 3  . glucose blood (CVS GLUCOSE METER TEST STRIPS) test strip TEST BLOOD SUGAR 1-2 TIMES A DAY    . glucose blood (ONE TOUCH ULTRA TEST) test strip TEST BLOOD SUGAR 1-2 TIMES A DAY 100 each 4  . Lancets (ONETOUCH DELICA PLUS [id number]) MISC USE AS INSTRUCTED 100 each 12  . levothyroxine (SYNTHROID) 137 MCG tablet Take 1 tablet (137 mcg total) by mouth daily before breakfast. 90 tablet 1  . losartan-hydrochlorothiazide (HYZAAR) 50-12.5 MG tablet TAKE 1 TABLET BY MOUTH EVERY DAY 90 tablet 1  . meloxicam (MOBIC) 7.5 MG tablet TAKE 1 TABLET (7.5 MG TOTAL) BY MOUTH DAILY AS NEEDED FOR PAIN. TAKE WITH FOOD. 90 tablet 0  . metFORMIN (GLUCOPHAGE) 500 MG tablet TAKE 1 TABLET BY MOUTH TWICE A DAY 180 tablet 1  . metoprolol tartrate (LOPRESSOR) 25 MG tablet Take 1 tablet (25 mg total) by mouth 2 (two) times daily. 90 tablet 1   No current facility-administered medications for this visit.     PHYSICAL EXAMINATION: ECOG PERFORMANCE STATUS: 0 - Asymptomatic Vitals:   10/23/20 1306  BP: 102/67  Pulse: 76  Resp: 16  Temp: (!) 96.8 F (36 C)   Filed Weights   10/23/20 1306  Weight: 229 lb 12.8 oz (104.2 kg)    Physical Exam Constitutional:      General: She is not in acute distress.    Appearance: She is well-developed.  HENT:     Head: Normocephalic and atraumatic.  Eyes:     General: No scleral icterus.    Conjunctiva/sclera: Conjunctivae normal.     Pupils: Pupils are equal, round, and reactive to light.  Cardiovascular:     Rate and Rhythm: Normal rate and regular rhythm.     Heart sounds: Normal heart sounds.  Pulmonary:     Effort: Pulmonary effort is normal. No respiratory distress.     Breath sounds: Normal breath sounds. No wheezing.  Abdominal:     General: Bowel sounds are normal. There is no distension.     Palpations: Abdomen is soft. There is no mass.     Tenderness: There is no abdominal tenderness.   Musculoskeletal:        General: No deformity. Normal range of motion.     Cervical back: Normal range of motion and neck  supple.  Lymphadenopathy:     Cervical: No cervical adenopathy.  Skin:    General: Skin is warm and dry.     Findings: No erythema or rash.  Neurological:     Mental Status: She is alert and oriented to person, place, and time. Mental status is at baseline.     Cranial Nerves: No cranial nerve deficit.     Coordination: Coordination normal.  Psychiatric:        Mood and Affect: Mood normal.        Thought Content: Thought content normal.      LABORATORY DATA:  I have reviewed the data as listed Lab Results  Component Value Date   WBC 3.9 (L) 10/16/2020   HGB 13.9 10/16/2020   HCT 40.8 10/16/2020   MCV 92.5 10/16/2020   PLT 272 10/16/2020   Recent Labs    12/06/19 1201 12/06/19 1201 03/11/20 1028 04/27/20 1112 04/30/20 0513 06/12/20 1047 10/16/20 1101  NA 136  --  138 138 138 138 134*  K 3.8  --  4.0 4.0 4.1 4.4 3.6  CL 100  --  101 102 101 100 96*  CO2 31  --  28 27 24 27 28   GLUCOSE 94  --  124* 82 154* 110* 144*  BUN 12  --  14 12 15 10 12   CREATININE 0.70  --  0.83 0.62 0.66 0.63 0.60  CALCIUM 9.7  --  9.2 9.9 8.8* 9.7 9.4  GFRNONAA  --    < > >60 >60 >60 89 >60  GFRAA  --    < > >60 >60 >60 102  --   PROT 7.1  --  7.3  --   --   --  7.3  ALBUMIN 4.1  --  4.1  --   --   --  3.9  AST 25  --  28  --   --   --  29  ALT 24  --  26  --   --   --  27  ALKPHOS 60  --  59  --   --   --  62  BILITOT 0.6  --  0.7  --   --   --  0.7   < > = values in this interval not displayed.    Serum serotonin level at 111 within normal range 5 HIAA urine test was obtained and patient has not gotten done yet.   ASSESSMENT & PLAN:  1. 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 Kidney lesion   3. 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 02/15/2018   4.5 01/14/2019  3.8 09/23/2019    6 03/16/2020  7.7, 10/16/2020 pending   #Presumed Kidney cancer, clinical T1 lesion.  S/p cryoablation.  72month follow-up MRI abdomen with and without contrast showed post ablation changes in the left kidney without evidence of residual or recurrent disease at the site of ablation.   Former smoker, continue lung cancer screening program.  MRI abdomen showed more nodular area at the right lung base and a CT scan has been obtained for further evaluation.  Family history of breast cancer.  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   I have previously discussed with patient about genetic  testing.  Patient would like to defer   Follow-up in 6 months   all questions were answered. The patient knows to call the clinic with any problems questions or concerns.    Earlie Server, MD, PhD Hematology Oncology [facility] Pager- [phone] 10/23/2020

## 2020-10-23 NOTE — Telephone Encounter (Signed)
I called and informed the patient the she needed a dedicated Ct of the chest and she is okay with that, I also informed her that she would need a CT lung cancer screen this summer and she understood.  I infomred her that she would get calls to schedule these.

## 2020-10-23 NOTE — Addendum Note (Signed)
Addended by: Burnard Hawthorne on: 10/23/2020 09:04 AM   Modules accepted: Orders

## 2020-10-23 NOTE — Progress Notes (Signed)
Patient denies new problems/concerns today.   °

## 2020-10-23 NOTE — Telephone Encounter (Signed)
Rasheedah, calling pt today/tomorrow to inform her that she will need CT chest. Hold on calling her until Judson Roch speaks with her  Judson Roch, please call pt and inform her that she will need dedicated CT chest. I have ordered. We will call her to schedule   She will still have CT lung cancer screen this summer and will get a call from cancer center to schedule this as she has in the past.

## 2020-10-23 NOTE — Telephone Encounter (Signed)
Good morning!  Pt is scheduled for the Ct chest.

## 2020-10-28 ENCOUNTER — Telehealth: Payer: Self-pay

## 2020-10-28 NOTE — Telephone Encounter (Signed)
Called patient to schedule colonoscopy per Dr. Vicente Males. Unable to lvm.

## 2020-10-28 NOTE — Telephone Encounter (Signed)
-----   Message from [name], MD sent at 10/25/2020 12:03 PM EST ----- Dr Tasia Catchings   Personally am not aware of the actual recommendations for surveillance of colonic NET in those who have not undergone surgery . Hence I dont think it would be unreasonable to look at this time as it has been 3 years.   Kodie Pick can you please schedule for colonoscopy : Re: surveillance due to prior history of colon cancer.   Dr [name] MD,MRCP [facility]) Gastroenterology/Hepatology Pager: [phone]  ----- Message ----- From: Earlie Server, MD Sent: 10/23/2020   9:05 PM EST To: [name], MD  Dr.Anna  Patient has history of colon well differentiated neuroendocrine cancer in 2019,  The tumor was small and the tattoo of the site of tumor removal is not feasible so tumor board recommend observation rather than surgery.  She had surveillance colonoscopy in Nov 2019. When should she return to you for additional colonoscopy?  Talbert Cage

## 2020-10-29 DIAGNOSIS — E119 Type 2 diabetes mellitus without complications: Secondary | ICD-10-CM | POA: Diagnosis not present

## 2020-11-03 ENCOUNTER — Telehealth: Payer: Self-pay

## 2020-11-03 NOTE — Telephone Encounter (Signed)
-----   Message from [name], MD sent at 10/25/2020 12:03 PM EST ----- Dr Tasia Catchings   Personally am not aware of the actual recommendations for surveillance of colonic NET in those who have not undergone surgery . Hence I dont think it would be unreasonable to look at this time as it has been 3 years.    can you please schedule for colonoscopy : Re: surveillance due to prior history of colon cancer.   Dr [name] MD,MRCP [facility]) Gastroenterology/Hepatology Pager: [phone]  ----- Message ----- From: Earlie Server, MD Sent: 10/23/2020   9:05 PM EST To: [name], MD  Dr.Anna  Patient has history of colon well differentiated neuroendocrine cancer in 2019,  The tumor was small and the tattoo of the site of tumor removal is not feasible so tumor board recommend observation rather than surgery.  She had surveillance colonoscopy in Nov 2019. When should she return to you for additional colonoscopy?  Talbert Cage

## 2020-11-03 NOTE — Telephone Encounter (Signed)
2nd attempt to call patient to inform her of the results. Unable to leave voicemail. Will send letter out.

## 2020-11-04 LAB — 5 HIAA, QUANTITATIVE, URINE, 24 HOUR
5-HIAA, Ur: 2.2 mg/L
5-HIAA,Quant.,24 Hr Urine: 5.6 mg/24 hr (ref 0.0–14.9)
Total Volume: 2550

## 2020-11-05 DIAGNOSIS — N1831 Chronic kidney disease, stage 3a: Secondary | ICD-10-CM | POA: Diagnosis not present

## 2020-11-05 DIAGNOSIS — R609 Edema, unspecified: Secondary | ICD-10-CM | POA: Diagnosis not present

## 2020-11-05 DIAGNOSIS — R809 Proteinuria, unspecified: Secondary | ICD-10-CM | POA: Diagnosis not present

## 2020-11-05 DIAGNOSIS — E1122 Type 2 diabetes mellitus with diabetic chronic kidney disease: Secondary | ICD-10-CM | POA: Diagnosis not present

## 2020-11-10 ENCOUNTER — Ambulatory Visit
Admission: RE | Admit: 2020-11-10 | Discharge: 2020-11-10 | Disposition: A | Discharge status: Home / Self Care | Payer: Medicare Other | Source: Ambulatory Visit | Attending: Family | Admitting: Family

## 2020-11-10 ENCOUNTER — Other Ambulatory Visit: Payer: Self-pay

## 2020-11-10 DIAGNOSIS — E890 Postprocedural hypothyroidism: Secondary | ICD-10-CM | POA: Diagnosis not present

## 2020-11-10 DIAGNOSIS — I2510 Atherosclerotic heart disease of native coronary artery without angina pectoris: Secondary | ICD-10-CM | POA: Diagnosis not present

## 2020-11-10 DIAGNOSIS — Z85528 Personal history of other malignant neoplasm of kidney: Secondary | ICD-10-CM | POA: Diagnosis not present

## 2020-11-10 DIAGNOSIS — R911 Solitary pulmonary nodule: Secondary | ICD-10-CM | POA: Insufficient documentation

## 2020-11-10 DIAGNOSIS — J439 Emphysema, unspecified: Secondary | ICD-10-CM | POA: Diagnosis not present

## 2020-11-10 IMAGING — CT CT CHEST W/O CM
2 of 4 series · 14 of 36 positions shown, 17 images · non-contrast
Comparison: Abdominal MRI [DATE]. Lung cancer screening CT
[DATE], additional prior chest CTs reviewed .

CLINICAL DATA: Nodular areas in right lung base on abdominal MRI.
History of left renal carcinoma post ablation. Smoking history.

EXAM:
CT CHEST WITHOUT CONTRAST
TECHNIQUE: Multidetector CT imaging of the chest was performed following the
standard protocol without IV contrast.

[Series 2: chest 2.00 · axial · 0.81mm/px · z∈[-1172,-896]mm · 11 of 164 slices shown, 14 images]
[im 13/164  mediastinal]
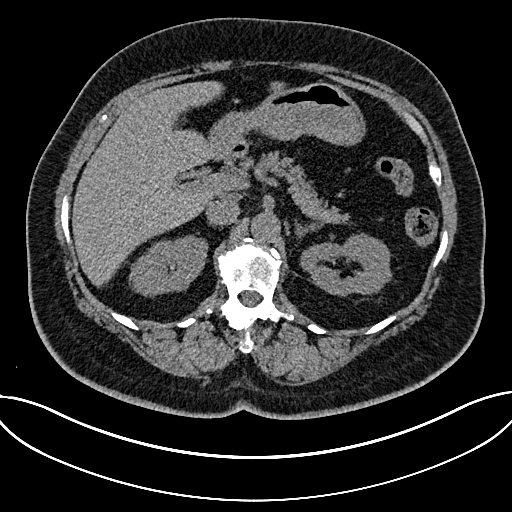
[im 13/164  lung]
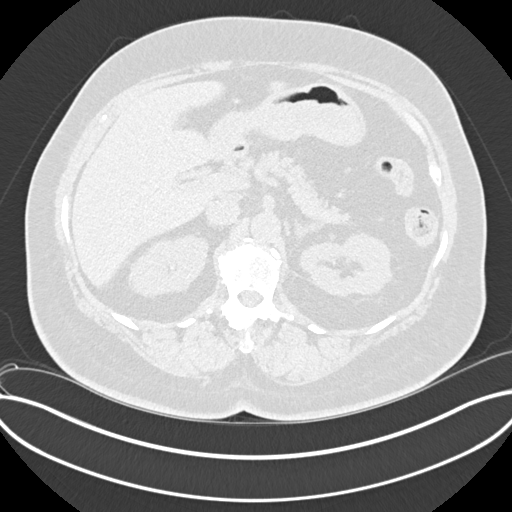
[im 26/164  lung]
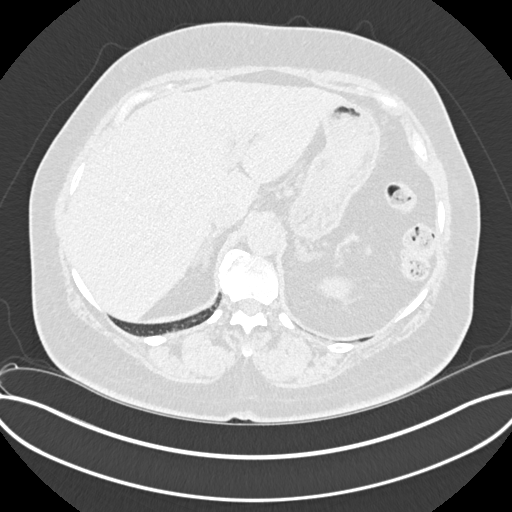
[im 38/164  lung]
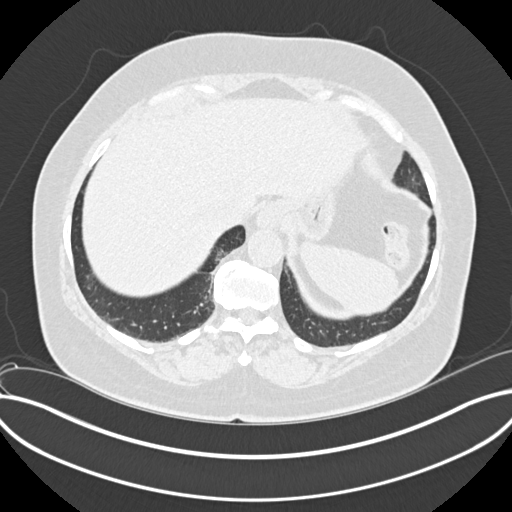
[im 51/164  lung]
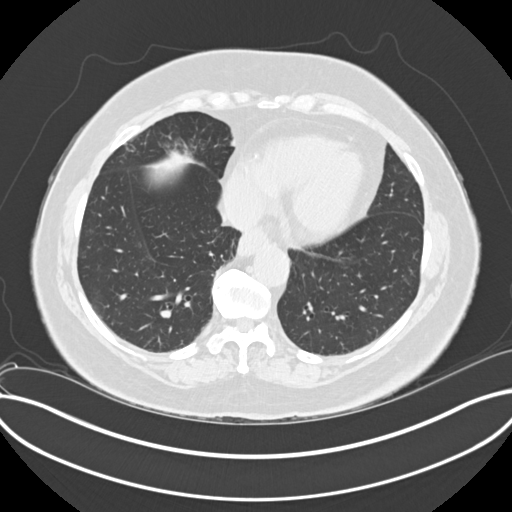
[im 63/164  mediastinal]
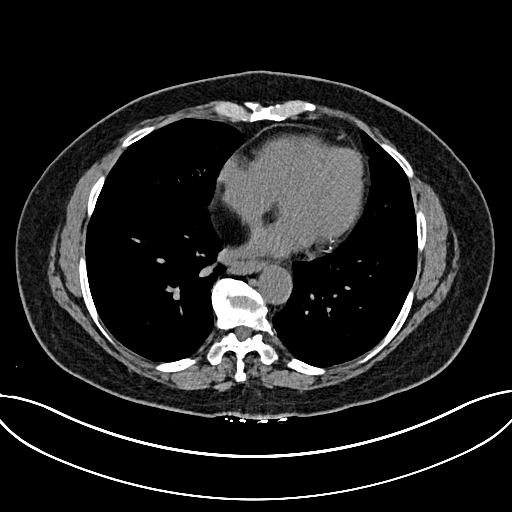
[im 63/164  lung]
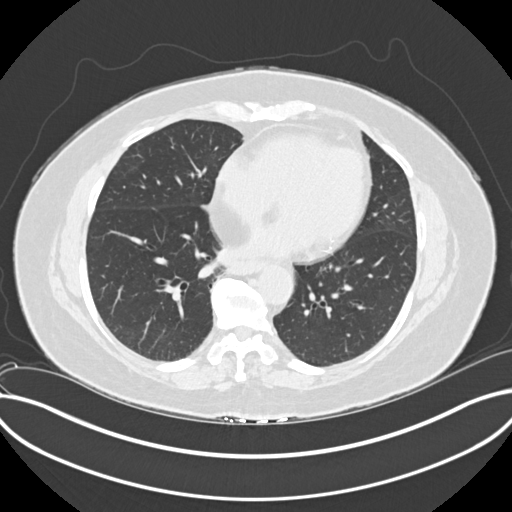
[im 88/164  lung]
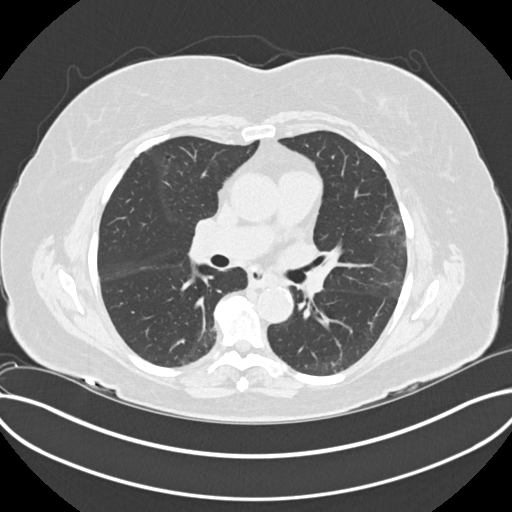
[im 101/164  lung]
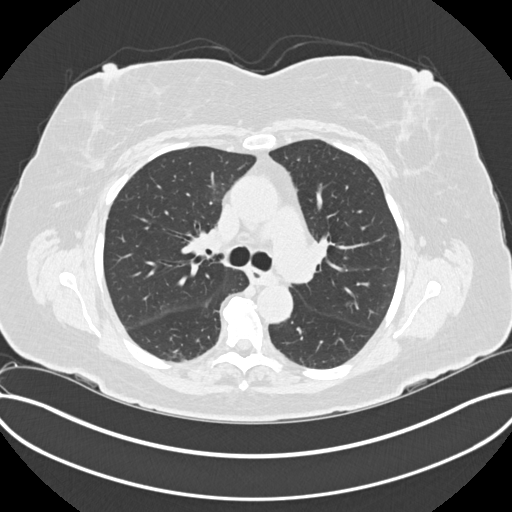
[im 113/164  lung]
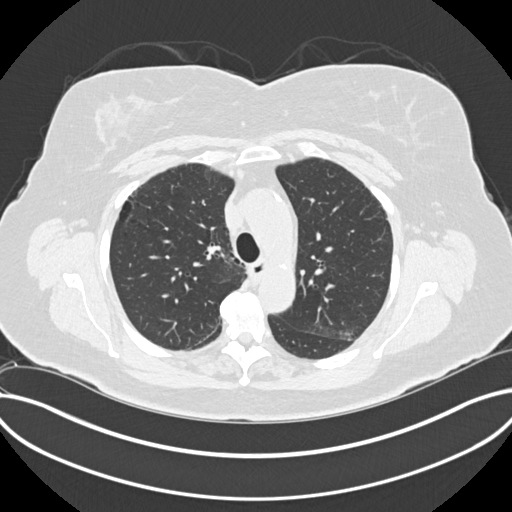
[im 126/164  mediastinal]
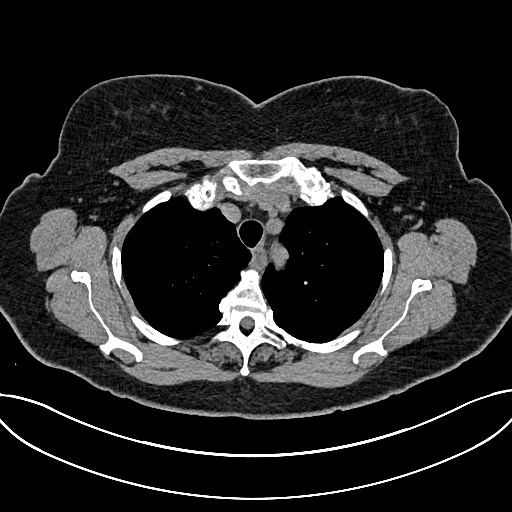
[im 126/164  lung]
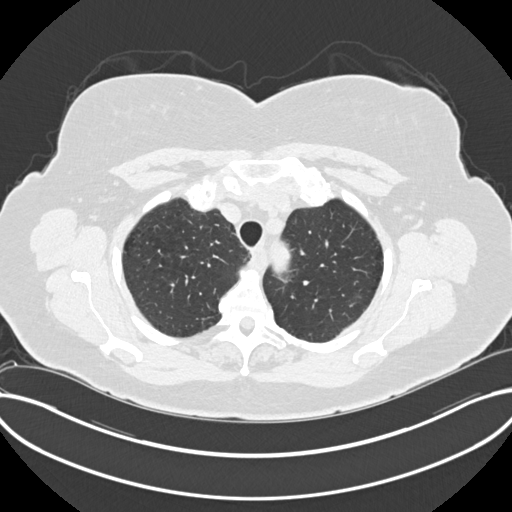
[im 138/164  lung]
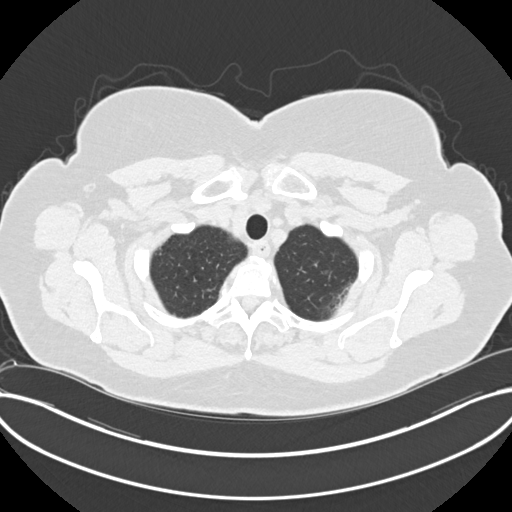
[im 151/164  lung]
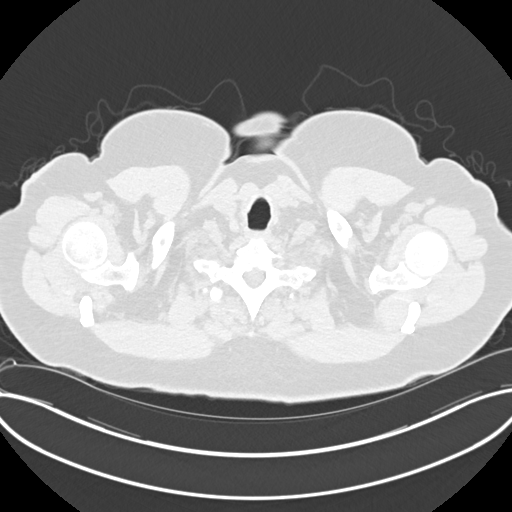

[Series 5: coronals chest 2.00 cor · coronal · 0.64mm/px · 3 of 181 slices shown]
[im 37/181  lung]
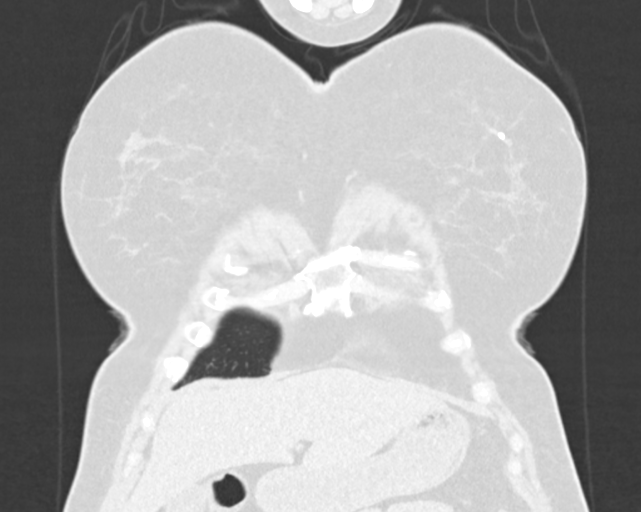
[im 73/181  lung]
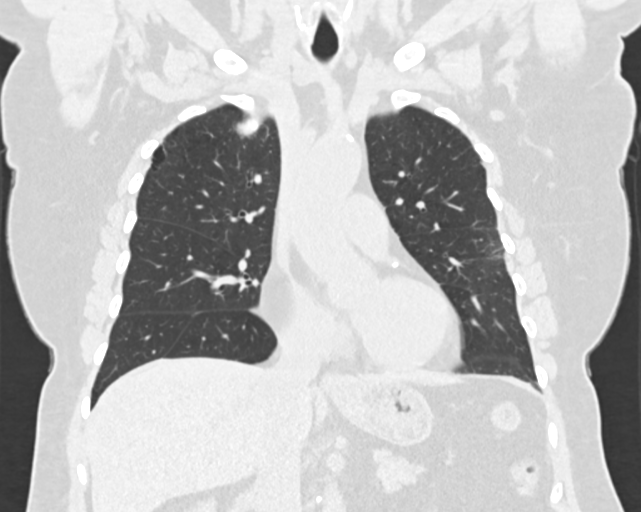
[im 109/181  lung]
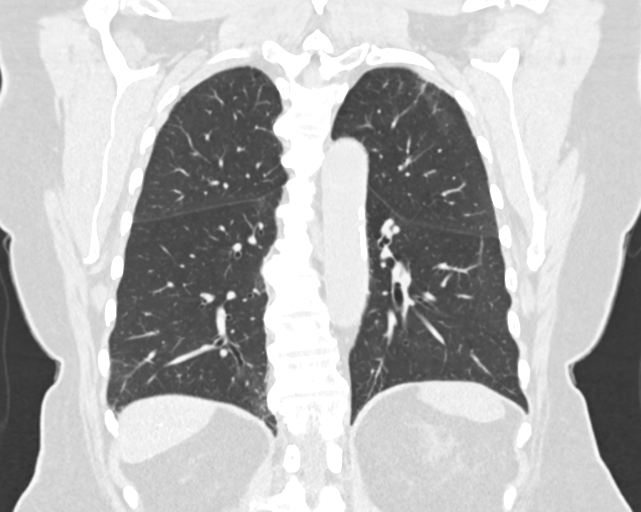

[14 of 36 positions shown; findings below may reference images not displayed]

FINDINGS: Cardiovascular: Atherosclerosis of the thoracic aorta. No aortic
aneurysm. Normal heart size. Coronary artery calcifications. No
pericardial fluid or thickening. Stable focal fatty prominence
adjacent to the right atrium and about the interatrial septum.

Mediastinum/Nodes: No enlarged mediastinal lymph nodes. Limited
hilar assessment on this noncontrast exam, no bulky hilar
adenopathy. No axillary adenopathy. Tiny hiatal hernia, no
esophageal wall thickening. Thyroidectomy

Lungs/Pleura: Mild emphysema. There is no evidence of pulmonary
nodule at the right lung base corresponding to that questioned on
abdominal MRI. Well-defined 11 mm left upper lobe nodule adjacent to
the transverse aorta, series 3, image 37, stable dating back to at
least [TP] chest CT and considered benign. Additional scattered
small pulmonary nodules including a medial right upper lobe nodule
measuring 5 mm, series 3, image 50, unchanged. Vague areas of
peripheral ground-glass were not seen on prior exam, for example
left upper lobe series 3, image 78 and right lower lobe series 3,
image 91. Scarring in the medial right lung adjacent to thoracic
osteophytes. Trachea and central bronchi are patent. No pleural
fluid.

Upper Abdomen: Ablation defect in the medial left kidney, previously
assessed on MRI. No acute upper abdominal findings.

Musculoskeletal: Diffuse thoracic spondylosis with endplate
spurring. No focal bone lesion or acute osseous abnormality.
IMPRESSION: 1. No evidence of pulmonary nodule at the right lung base
corresponding to that questioned on abdominal MRI.
2. Known pulmonary nodules are not significantly changed from prior
lung cancer screening CTs. Continued annual screening with low-dose
chest CT recommended. No new pulmonary nodule.
3. Vague areas of peripheral ground-glass opacity in both lungs, not
seen on prior exam, most consistent with pneumonitis. This is likely
infectious or inflammatory. Recommend attention on follow-up.
4. Coronary artery calcifications. Aortic Atherosclerosis
([TP]-[TP]) and Emphysema ([TP]-[TP]).

## 2020-11-16 DIAGNOSIS — I10 Essential (primary) hypertension: Secondary | ICD-10-CM | POA: Diagnosis not present

## 2020-11-16 DIAGNOSIS — E782 Mixed hyperlipidemia: Secondary | ICD-10-CM | POA: Diagnosis not present

## 2020-11-16 DIAGNOSIS — E119 Type 2 diabetes mellitus without complications: Secondary | ICD-10-CM | POA: Diagnosis not present

## 2020-11-16 DIAGNOSIS — K219 Gastro-esophageal reflux disease without esophagitis: Secondary | ICD-10-CM | POA: Diagnosis not present

## 2020-11-16 DIAGNOSIS — J449 Chronic obstructive pulmonary disease, unspecified: Secondary | ICD-10-CM | POA: Diagnosis not present

## 2020-11-16 DIAGNOSIS — I2510 Atherosclerotic heart disease of native coronary artery without angina pectoris: Secondary | ICD-10-CM | POA: Diagnosis not present

## 2020-11-16 DIAGNOSIS — N1831 Chronic kidney disease, stage 3a: Secondary | ICD-10-CM | POA: Diagnosis not present

## 2020-11-16 DIAGNOSIS — E079 Disorder of thyroid, unspecified: Secondary | ICD-10-CM | POA: Diagnosis not present

## 2020-11-16 DIAGNOSIS — R0600 Dyspnea, unspecified: Secondary | ICD-10-CM | POA: Diagnosis not present

## 2020-11-16 DIAGNOSIS — I700 Atherosclerosis of aorta: Secondary | ICD-10-CM | POA: Diagnosis not present

## 2020-11-18 ENCOUNTER — Other Ambulatory Visit: Payer: Self-pay

## 2020-11-18 ENCOUNTER — Telehealth (INDEPENDENT_AMBULATORY_CARE_PROVIDER_SITE_OTHER): Payer: Self-pay | Admitting: Gastroenterology

## 2020-11-18 DIAGNOSIS — Z860100 Personal history of colon polyps, unspecified: Secondary | ICD-10-CM

## 2020-11-18 MED ORDER — PEG 3350-KCL-NA BICARB-NACL 420 G PO SOLR: 4000.0000 mL | Freq: Once | ORAL | 0 refills | Status: AC

## 2020-11-18 NOTE — Progress Notes (Signed)
Gastroenterology Pre-Procedure Review  Request Date: Monday 12/28/20 Requesting Physician: Dr. Vicente Males  PATIENT REVIEW QUESTIONS: The patient responded to the following health history questions as indicated:    1. Are you having any GI issues? no 2. Do you have a personal history of Polyps? yes (08/10/18 colonoscopy performed by Dr. Vicente Males) 3. Do you have a family history of Colon Cancer or Polyps? no 4. Diabetes Mellitus? yes 5. Joint replacements in the past 12 months?no 6. Major health problems in the past 3 months?no 7. Any artificial heart valves, MVP, or defibrillator?no    MEDICATIONS & ALLERGIES:    Patient reports the following regarding taking any anticoagulation/antiplatelet therapy:   Plavix, Coumadin, Eliquis, Xarelto, Lovenox, Pradaxa, Brilinta, or Effient? no Aspirin? Yes 81 mg daily  Patient confirms/reports the following medications:  Current Outpatient Medications  Medication Sig Dispense Refill  . albuterol (VENTOLIN HFA) 108 (90 Base) MCG/ACT inhaler Inhale 2 puffs into the lungs every 6 (six) hours as needed for wheezing or shortness of breath. 8 g 1  . amLODipine (NORVASC) 10 MG tablet TAKE 1 TABLET (10 MG TOTAL) BY MOUTH DAILY. 90 tablet 3  . aspirin EC 81 MG tablet Take 81 mg by mouth daily. Swallow whole.    Marland Kitchen atorvastatin (LIPITOR) 20 MG tablet TAKE 1 TABLET BY MOUTH EVERY DAY 90 tablet 0  . Calcium Carbonate-Vit D-Min (CALCIUM 600+D PLUS MINERALS) 600-400 MG-UNIT TABS Take 1 tablet by mouth daily.     . diclofenac sodium (VOLTAREN) 1 % GEL Apply 1 application topically 4 (four) times daily as needed (knee pain.).     Marland Kitchen DULoxetine (CYMBALTA) 60 MG capsule Take 1 capsule (60 mg total) by mouth daily. 90 capsule 3  . glucose blood ([facility] GLUCOSE METER TEST STRIPS) test strip TEST BLOOD SUGAR 1-2 TIMES A DAY    . glucose blood (ONE TOUCH ULTRA TEST) test strip TEST BLOOD SUGAR 1-2 TIMES A DAY 100 each 4  . Lancets (ONETOUCH DELICA PLUS [id number]) MISC USE AS INSTRUCTED  100 each 12  . levothyroxine (SYNTHROID) 137 MCG tablet Take 1 tablet (137 mcg total) by mouth daily before breakfast. 90 tablet 1  . losartan-hydrochlorothiazide (HYZAAR) 50-12.5 MG tablet TAKE 1 TABLET BY MOUTH EVERY DAY 90 tablet 1  . meloxicam (MOBIC) 7.5 MG tablet TAKE 1 TABLET (7.5 MG TOTAL) BY MOUTH DAILY AS NEEDED FOR PAIN. TAKE WITH FOOD. 90 tablet 0  . metFORMIN (GLUCOPHAGE) 500 MG tablet TAKE 1 TABLET BY MOUTH TWICE A DAY 180 tablet 1  . metoprolol tartrate (LOPRESSOR) 25 MG tablet Take 1 tablet (25 mg total) by mouth 2 (two) times daily. 90 tablet 1   No current facility-administered medications for this visit.    Patient confirms/reports the following allergies:  No Known Allergies  No orders of the defined types were placed in this encounter.   AUTHORIZATION INFORMATION Primary Insurance: 1D#: Group #:  Secondary Insurance: 1D#: Group #:  SCHEDULE INFORMATION: Date: Monday 12/28/20 Time: Location:[facility]

## 2020-11-25 ENCOUNTER — Telehealth: Payer: Self-pay | Admitting: Family

## 2020-11-25 NOTE — Telephone Encounter (Signed)
Call pt Just wanted her to be aware that shawn perkins from cancer center reviewed CT chest done 11/10/20 and stated she would not need repeat until 10/2021; Cancer center would call her then Previously her CT chest would be due in June 2022 however I wanted her to be aware of this finding

## 2020-11-25 NOTE — Telephone Encounter (Signed)
-----   Message from Lieutenant Diego, RN sent at 11/11/2020 12:24 PM EST ----- Regarding: lung screening patient Cynthia [name], I saw the CT report for Cynthia [name]. I've put her on my list to get her next scan 11/10/21 with lung screening.  Thanks, [employer]

## 2020-11-25 NOTE — Telephone Encounter (Signed)
Pt called and aware she will be contacted to have CT lung cancer screen repeated 10/2021.

## 2020-12-02 ENCOUNTER — Telehealth: Payer: Self-pay | Admitting: Gastroenterology

## 2020-12-02 NOTE — Telephone Encounter (Signed)
Patient needs to reschedule her procedure on 12/28/20

## 2020-12-03 ENCOUNTER — Other Ambulatory Visit: Payer: Self-pay | Admitting: Family

## 2020-12-04 ENCOUNTER — Telehealth: Payer: Self-pay | Admitting: Family

## 2020-12-04 ENCOUNTER — Other Ambulatory Visit: Payer: Self-pay | Admitting: Family

## 2020-12-04 DIAGNOSIS — I10 Essential (primary) hypertension: Secondary | ICD-10-CM

## 2020-12-04 DIAGNOSIS — E785 Hyperlipidemia, unspecified: Secondary | ICD-10-CM

## 2020-12-04 DIAGNOSIS — E1140 Type 2 diabetes mellitus with diabetic neuropathy, unspecified: Secondary | ICD-10-CM

## 2020-12-04 MED ORDER — LOSARTAN POTASSIUM-HCTZ 50-12.5 MG PO TABS: 1.0000 | ORAL_TABLET | Freq: Every day | ORAL | 1 refills | Status: DC

## 2020-12-04 MED ORDER — ATORVASTATIN CALCIUM 20 MG PO TABS: 20.0000 mg | ORAL_TABLET | Freq: Every day | ORAL | 0 refills | Status: DC

## 2020-12-04 NOTE — Telephone Encounter (Signed)
Patient called second time to reschedule procedure 12/28/20

## 2020-12-04 NOTE — Telephone Encounter (Signed)
Pt needs a refill on atorvastatin (LIPITOR) 20 MG tablet and losartan-hydrochlorothiazide (HYZAAR) 50-12.5 MG tablet sent to [facility]

## 2020-12-08 NOTE — Telephone Encounter (Signed)
Returned pt's call. Unable to contact, no vm set up.

## 2020-12-08 NOTE — Telephone Encounter (Signed)
Patient called back wanting to reschedule, please call on her home number [phone] per patient.

## 2020-12-11 NOTE — Telephone Encounter (Signed)
Spoke with pt and was able to reschedule her procedure to 02-08-21. [facility] has been notified.

## 2020-12-28 ENCOUNTER — Other Ambulatory Visit: Payer: Self-pay | Admitting: *Deleted

## 2020-12-28 DIAGNOSIS — N2889 Other specified disorders of kidney and ureter: Secondary | ICD-10-CM

## 2020-12-28 NOTE — Progress Notes (Signed)
bmp 

## 2020-12-31 ENCOUNTER — Other Ambulatory Visit: Payer: Self-pay

## 2020-12-31 ENCOUNTER — Other Ambulatory Visit: Payer: Medicare Other

## 2020-12-31 DIAGNOSIS — N2889 Other specified disorders of kidney and ureter: Secondary | ICD-10-CM | POA: Diagnosis not present

## 2021-01-01 LAB — BASIC METABOLIC PANEL
BUN/Creatinine Ratio: 19 (ref 12–28)
BUN: 14 mg/dL (ref 8–27)
CO2: 23 mmol/L (ref 20–29)
Calcium: 9.9 mg/dL (ref 8.7–10.3)
Chloride: 100 mmol/L (ref 96–106)
Creatinine, Ser: 0.75 mg/dL (ref 0.57–1.00)
Glucose: 154 mg/dL — ABNORMAL HIGH (ref 65–99)
Potassium: 4 mmol/L (ref 3.5–5.2)
Sodium: 140 mmol/L (ref 134–144)
eGFR: 83 mL/min/{1.73_m2} (ref >59)

## 2021-01-06 ENCOUNTER — Other Ambulatory Visit: Payer: Self-pay

## 2021-01-06 ENCOUNTER — Ambulatory Visit: Payer: Medicare Other | Admitting: Urology

## 2021-01-06 ENCOUNTER — Encounter: Payer: Self-pay | Admitting: Urology

## 2021-01-06 VITALS — BP 143/87 | HR 85 | Ht 68.0 in | Wt 234.0 lb

## 2021-01-06 DIAGNOSIS — C642 Malignant neoplasm of left kidney, except renal pelvis: Secondary | ICD-10-CM

## 2021-01-06 DIAGNOSIS — N2889 Other specified disorders of kidney and ureter: Secondary | ICD-10-CM | POA: Diagnosis not present

## 2021-01-06 NOTE — Progress Notes (Signed)
   01/06/2021 9:48 AM   Cynthia [name] April 06, 1946 [id number]  Reason for visit: Follow up [place] s/p cryoablation  HPI: I saw Cynthia [name] in urology clinic for follow-up of a 3 cm left cystic renal mass.  Briefly, she is a 75 year old comorbid female with COPD, CAD, diabetes, and history of neuroendocrine cancer of the colon who opted for renal mass biopsy and ablation with interventional radiology.  She underwent this procedure with Dr. 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  She has been doing very well since that time and denies any complaints, specifically any gross hematuria, flank pain, abdominal pain, or fevers.  I personally viewed and interpreted her most recent follow-up imaging on 09/25/2020 with MRI that shows no evidence of recurrence, and CT chest from 11/11/2020 that shows no evidence of metastatic disease.  Renal function is normal with creatinine 0.75, EGFR greater than 60.  IR will continue to order surveillance imaging.  RTC with me 1 year with BMP prior   Billey Co, MD  [facility] [address], [place], [place] [address] [phone]

## 2021-01-07 ENCOUNTER — Encounter: Payer: Self-pay | Admitting: Podiatry

## 2021-01-07 ENCOUNTER — Ambulatory Visit: Payer: Medicare Other | Admitting: Podiatry

## 2021-01-07 DIAGNOSIS — M79674 Pain in right toe(s): Secondary | ICD-10-CM | POA: Diagnosis not present

## 2021-01-07 DIAGNOSIS — B351 Tinea unguium: Secondary | ICD-10-CM

## 2021-01-07 DIAGNOSIS — E1140 Type 2 diabetes mellitus with diabetic neuropathy, unspecified: Secondary | ICD-10-CM

## 2021-01-07 DIAGNOSIS — M79675 Pain in left toe(s): Secondary | ICD-10-CM

## 2021-01-07 NOTE — Progress Notes (Signed)

## 2021-01-18 ENCOUNTER — Ambulatory Visit: Payer: Medicare Other | Admitting: Family

## 2021-01-22 ENCOUNTER — Ambulatory Visit: Payer: Medicare Other | Admitting: Family

## 2021-02-02 ENCOUNTER — Ambulatory Visit: Payer: Medicare Other | Admitting: Family

## 2021-02-08 ENCOUNTER — Ambulatory Visit: Payer: Medicare Other | Admitting: Registered Nurse

## 2021-02-08 ENCOUNTER — Encounter: Payer: Self-pay | Admitting: Gastroenterology

## 2021-02-08 ENCOUNTER — Encounter
Admission: RE | Disposition: A | Discharge status: Home / Self Care | Payer: Self-pay | Source: Home / Self Care | Attending: Gastroenterology

## 2021-02-08 ENCOUNTER — Ambulatory Visit
Admission: RE | Admit: 2021-02-08 | Discharge: 2021-02-08 | Disposition: A | Discharge status: Home / Self Care | Payer: Medicare Other | Attending: Gastroenterology | Admitting: Gastroenterology

## 2021-02-08 DIAGNOSIS — E039 Hypothyroidism, unspecified: Secondary | ICD-10-CM | POA: Diagnosis not present

## 2021-02-08 DIAGNOSIS — Z791 Long term (current) use of non-steroidal anti-inflammatories (NSAID): Secondary | ICD-10-CM | POA: Insufficient documentation

## 2021-02-08 DIAGNOSIS — D120 Benign neoplasm of cecum: Secondary | ICD-10-CM | POA: Diagnosis not present

## 2021-02-08 DIAGNOSIS — K219 Gastro-esophageal reflux disease without esophagitis: Secondary | ICD-10-CM | POA: Diagnosis not present

## 2021-02-08 DIAGNOSIS — Z7984 Long term (current) use of oral hypoglycemic drugs: Secondary | ICD-10-CM | POA: Diagnosis not present

## 2021-02-08 DIAGNOSIS — Z7982 Long term (current) use of aspirin: Secondary | ICD-10-CM | POA: Insufficient documentation

## 2021-02-08 DIAGNOSIS — Z86010 Personal history of colonic polyps: Secondary | ICD-10-CM | POA: Diagnosis not present

## 2021-02-08 DIAGNOSIS — Z8249 Family history of ischemic heart disease and other diseases of the circulatory system: Secondary | ICD-10-CM | POA: Diagnosis not present

## 2021-02-08 DIAGNOSIS — Z803 Family history of malignant neoplasm of breast: Secondary | ICD-10-CM | POA: Diagnosis not present

## 2021-02-08 DIAGNOSIS — Z1211 Encounter for screening for malignant neoplasm of colon: Secondary | ICD-10-CM | POA: Insufficient documentation

## 2021-02-08 DIAGNOSIS — D125 Benign neoplasm of sigmoid colon: Secondary | ICD-10-CM | POA: Diagnosis not present

## 2021-02-08 DIAGNOSIS — Z79899 Other long term (current) drug therapy: Secondary | ICD-10-CM | POA: Diagnosis not present

## 2021-02-08 DIAGNOSIS — I10 Essential (primary) hypertension: Secondary | ICD-10-CM | POA: Diagnosis not present

## 2021-02-08 DIAGNOSIS — Z7951 Long term (current) use of inhaled steroids: Secondary | ICD-10-CM | POA: Insufficient documentation

## 2021-02-08 DIAGNOSIS — K635 Polyp of colon: Secondary | ICD-10-CM

## 2021-02-08 DIAGNOSIS — Z8349 Family history of other endocrine, nutritional and metabolic diseases: Secondary | ICD-10-CM | POA: Insufficient documentation

## 2021-02-08 DIAGNOSIS — Z87891 Personal history of nicotine dependence: Secondary | ICD-10-CM | POA: Insufficient documentation

## 2021-02-08 DIAGNOSIS — E119 Type 2 diabetes mellitus without complications: Secondary | ICD-10-CM | POA: Insufficient documentation

## 2021-02-08 DIAGNOSIS — D122 Benign neoplasm of ascending colon: Secondary | ICD-10-CM | POA: Insufficient documentation

## 2021-02-08 DIAGNOSIS — Z833 Family history of diabetes mellitus: Secondary | ICD-10-CM | POA: Diagnosis not present

## 2021-02-08 DIAGNOSIS — C642 Malignant neoplasm of left kidney, except renal pelvis: Secondary | ICD-10-CM | POA: Diagnosis not present

## 2021-02-08 HISTORY — PX: COLONOSCOPY WITH PROPOFOL: SHX5780

## 2021-02-08 LAB — GLUCOSE, CAPILLARY: Glucose-Capillary: 147 mg/dL — ABNORMAL HIGH (ref 70–99)

## 2021-02-08 SURGERY — COLONOSCOPY WITH PROPOFOL
Anesthesia: General

## 2021-02-08 MED ORDER — PROPOFOL 500 MG/50ML IV EMUL
INTRAVENOUS | Status: DC | PRN
  Administered 2021-02-08: 150 ug/kg/min via INTRAVENOUS

## 2021-02-08 MED ORDER — PROPOFOL 10 MG/ML IV BOLUS
INTRAVENOUS | Status: AC
  Filled 2021-02-08: qty 20

## 2021-02-08 MED ORDER — PHENYLEPHRINE HCL (PRESSORS) 10 MG/ML IV SOLN
INTRAVENOUS | Status: DC | PRN
  Administered 2021-02-08 (×2): 150 ug via INTRAVENOUS

## 2021-02-08 MED ORDER — SODIUM CHLORIDE 0.9 % IV SOLN
INTRAVENOUS | Status: DC
  Administered 2021-02-08: 20 mL/h via INTRAVENOUS

## 2021-02-08 MED ORDER — PROPOFOL 10 MG/ML IV BOLUS
INTRAVENOUS | Status: DC | PRN
  Administered 2021-02-08: 80 mg via INTRAVENOUS

## 2021-02-08 MED ORDER — LIDOCAINE HCL (CARDIAC) PF 100 MG/5ML IV SOSY
PREFILLED_SYRINGE | INTRAVENOUS | Status: DC | PRN
  Administered 2021-02-08: 100 mg via INTRAVENOUS

## 2021-02-08 NOTE — Anesthesia Preprocedure Evaluation (Signed)
Anesthesia Evaluation  Patient identified by MRN, date of birth, ID band Patient awake    Reviewed: Allergy & Precautions, NPO status , Patient's Chart, lab work & pertinent test results  History of Anesthesia Complications Negative for: history of anesthetic complications  Airway Mallampati: III  TM Distance: >3 FB Neck ROM: limited    Dental no notable dental hx. (+) Upper Dentures, Lower Dentures   Pulmonary neg sleep apnea, COPD,  COPD inhaler, former smoker,    Pulmonary exam normal        Cardiovascular hypertension, Pt. 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  No date: COPD (chronic obstructive pulmonary disease) (HCC) No date: Coronary artery disease No date: Diabetes mellitus without complication (HCC) No date: Hyperlipidemia No date: Hypertension No date: Lung nodule No date: Thyroid disease   Reproductive/Obstetrics                             Anesthesia Physical  Anesthesia Plan  ASA: II  Anesthesia Plan: General   Post-op Pain Management:    Induction: Intravenous  PONV Risk Score and Plan: 2 and Propofol infusion  Airway Management Planned: Natural Airway  Additional Equipment:   Intra-op Plan:   Post-operative Plan:   Informed Consent: I have reviewed the patients History and Physical, chart, labs and discussed the procedure including the  risks, benefits and alternatives for the proposed anesthesia with the patient or authorized representative who has indicated his/her understanding and acceptance.     Dental advisory given  Plan Discussed with: CRNA and Anesthesiologist  Anesthesia Plan Comments: (Patient consented for risks of anesthesia including but not limited to:  - adverse reactions to medications - risk of airway placement if required - damage to eyes, teeth, lips or other oral mucosa - nerve damage due to positioning  - sore throat or hoarseness - Damage to heart, brain, nerves, lungs, other parts of body or loss of life  Patient voiced understanding.)        Anesthesia Quick Evaluation

## 2021-02-08 NOTE — Anesthesia Postprocedure Evaluation (Signed)
Anesthesia Post Note  Patient: Cynthia [name]  Procedure(s) Performed: COLONOSCOPY WITH PROPOFOL (N/A )  Patient location during evaluation: Endoscopy Anesthesia Type: General Level of consciousness: awake and alert Pain management: pain level controlled Vital Signs Assessment: post-procedure vital signs reviewed and stable Respiratory status: spontaneous breathing, nonlabored ventilation, respiratory function stable and patient connected to nasal cannula oxygen Cardiovascular status: blood pressure returned to baseline and stable Postop Assessment: no apparent nausea or vomiting Anesthetic complications: no   No complications documented.   Last Vitals:  Vitals:   02/08/21 1050 02/08/21 1100  BP: 130/80 130/80  Pulse: 74 72  Resp: 13 12  Temp:    SpO2: 99% 99%    Last Pain:  Vitals:   02/08/21 1023  TempSrc: Tympanic  PainSc: Asleep                 Precious Haws Janesia Joswick

## 2021-02-08 NOTE — Transfer of Care (Signed)
Immediate Anesthesia Transfer of Care Note  Patient: Cynthia [name]  Procedure(s) Performed: COLONOSCOPY WITH PROPOFOL (N/A )  Patient Location: Endoscopy Unit  Anesthesia Type:General  Level of Consciousness: drowsy  Airway & Oxygen Therapy: Patient Spontanous Breathing  Post-op Assessment: Report given to RN and Post -op Vital signs reviewed and stable  Post vital signs: Reviewed and stable  Last Vitals:  Vitals Value Taken Time  BP 103/66 02/08/21 1023  Temp 36.1 C 02/08/21 1023  Pulse 78 02/08/21 1025  Resp 18 02/08/21 1025  SpO2 100 % 02/08/21 1025  Vitals shown include unvalidated device data.  Last Pain:  Vitals:   02/08/21 1023  TempSrc: Tympanic  PainSc: Asleep         Complications: No complications documented.

## 2021-02-08 NOTE — Op Note (Signed)
[facility] Gastroenterology Patient Name: Cynthia [name] Procedure Date: 02/08/2021 9:36 AM MRN: [id number] Account #: [id number] Date of Birth: 09-13-1946 Admit Type: Outpatient Age: 75 Room: [facility] ENDO ROOM 4 Gender: Female Note Status: Finalized Procedure:             Colonoscopy Indications:           Surveillance: Personal history of adenomatous polyps                         on last colonoscopy 3 years ago, Last colonoscopy:                         November 2019 Providers:             [name] MD, MD Referring MD:          Yvetta Coder. Arnett (Referring MD) Medicines:             Monitored Anesthesia Care Complications:         No immediate complications. Procedure:             Pre-Anesthesia Assessment:                        - Prior to the procedure, a History and Physical was                         performed, and patient medications, allergies and                         sensitivities were reviewed. The patient's tolerance                         of previous anesthesia was reviewed.                        - The risks and benefits of the procedure and the                         sedation options and risks were discussed with the                         patient. All questions were answered and informed                         consent was obtained.                        - ASA Grade Assessment: II - A patient with mild                         systemic disease.                        After obtaining informed consent, the colonoscope was                         passed under direct vision. Throughout the procedure,                         the patient's blood pressure, pulse, and oxygen  saturations were monitored continuously. The                         Colonoscope was introduced through the anus and                         advanced to the the cecum, identified by the                         appendiceal orifice. The colonoscopy was  performed                         with ease. The patient tolerated the procedure well.                         The quality of the bowel preparation was adequate. Findings:      The perianal and digital rectal examinations were normal.      A 3 mm polyp was found in the cecum. The polyp was sessile. The polyp       was removed with a cold biopsy forceps. Resection and retrieval were       complete.      A 8 mm polyp was found in the cecum. The polyp was sessile. The polyp       was removed with a cold snare. Resection and retrieval were complete.      Two sessile polyps were found in the sigmoid colon and descending colon.       The polyps were 4 to 5 mm in size. These polyps were removed with a cold       snare. Resection and retrieval were complete.      Three sessile polyps were found in the ascending colon. The polyps were       4 to 5 mm in size. These polyps were removed with a cold snare.       Resection and retrieval were complete.      The exam was otherwise without abnormality on direct and retroflexion       views. Impression:            - One 3 mm polyp in the cecum, removed with a cold                         biopsy forceps. Resected and retrieved.                        - One 8 mm polyp in the cecum, removed with a cold                         snare. Resected and retrieved.                        - Two 4 to 5 mm polyps in the sigmoid colon and in the                         descending colon, removed with a cold snare. Resected                         and retrieved.                        -  Three 4 to 5 mm polyps in the ascending colon,                         removed with a cold snare. Resected and retrieved.                        - The examination was otherwise normal on direct and                         retroflexion views. Recommendation:        - Discharge patient to home (with escort).                        - Resume previous diet.                        - Continue  present medications.                        - Await pathology results.                        - Repeat colonoscopy for surveillance based on                         pathology results. Procedure Code(s):     --- Professional ---                        [phone], Colonoscopy, flexible; with removal of                         tumor(s), polyp(s), or other lesion(s) by snare                         technique                        [id number], 77, Colonoscopy, flexible; with biopsy, single                         or multiple Diagnosis Code(s):     --- Professional ---                        K63.5, Polyp of colon                        Z86.010, Personal history of colonic polyps CPT copyright 2019 [employer]. All rights reserved. The codes documented in this report are preliminary and upon coder review may  be revised to meet current compliance requirements. [name], MD [name] MD, MD 02/08/2021 10:18:57 AM This report has been signed electronically. Number of Addenda: 0 Note Initiated On: 02/08/2021 9:36 AM Scope Withdrawal Time: 0 hours 21 minutes 1 second  Total Procedure Duration: 0 hours 25 minutes 35 seconds  Estimated Blood Loss:  Estimated blood loss: none.      [facility]

## 2021-02-08 NOTE — H&P (Signed)
[name], MD [address] [place], [place], [place], [address] [address], [place], [place], [place], [address] Phone: [phone]  Fax: [phone]  Primary Care Physician:  Burnard Hawthorne, FNP   Pre-Procedure History & Physical: HPI:  Cynthia [name] is a 75 y.o. female is here for an colonoscopy.   Past Medical History:  Diagnosis Date  . Arthritis   . 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 COPD (chronic obstructive pulmonary disease) ([place])   . Coronary artery disease    pt. denies  . Diabetes mellitus without complication ([place])    type 2  . GERD (gastroesophageal reflux disease)   . Hyperlipidemia   . Hypertension   . Hypothyroidism   . Lung nodule   . Thyroid disease     Past Surgical History:  Procedure Laterality Date  . BREAST BIOPSY Left 11/25/2015   stereo  neg  . BREAST EXCISIONAL BIOPSY Left yrs ago   benign  . COLONOSCOPY WITH PROPOFOL N/A 04/20/2017   Procedure: COLONOSCOPY WITH PROPOFOL;  Surgeon: [name], MD;  Location: [facility] ENDOSCOPY;  Service: Endoscopy;  Laterality: N/A;  . COLONOSCOPY WITH PROPOFOL N/A 01/08/2018   Procedure: COLONOSCOPY WITH PROPOFOL;  Surgeon: [name], MD;  Location: [facility] ENDOSCOPY;  Service: Gastroenterology;  Laterality: N/A;  . COLONOSCOPY WITH PROPOFOL N/A 08/10/2018   Procedure: COLONOSCOPY WITH PROPOFOL;  Surgeon: [name], MD;  Location: [facility] END[facility]COPY;  Service: Gastroenterology;  Laterality: N/A;  . IR RADIOLOGIST EVAL & MGMT  04/08/2020  . IR RADIOLOGIST EVAL & MGMT  05/26/2020  . IR RADIOLOGIST EVAL & MGMT  09/29/2020  . RADIOLOGY WITH ANESTHESIA N/A 04/29/2020   Procedure: CRYOABLATION;  Surgeon: Arne Cleveland, MD;  Location: [facility] ORS;  Service: Radiology;  Laterality: N/A;  . THYROIDECTOMY     Dr. [name]  . VAGINAL DELIVERY     4    Prior to Admission medications   Medication Sig Start Date End Date Taking? Authorizing Provider  albuterol (VENTOLIN HFA) 108 (90 Base)  MCG/ACT inhaler Inhale 2 puffs into the lungs every 6 (six) hours as needed for wheezing or shortness of breath. 08/24/20  Yes Arnett, Yvetta Coder, FNP  amLODipine (NORVASC) 10 MG tablet TAKE 1 TABLET (10 MG TOTAL) BY MOUTH DAILY. 03/13/20  Yes Burnard Hawthorne, FNP  aspirin EC 81 MG tablet Take 81 mg by mouth daily. Swallow whole.   Yes [provider]  atorvastatin (LIPITOR) 20 MG tablet Take 1 tablet (20 mg total) by mouth daily. 12/04/20  Yes Arnett, Yvetta Coder, FNP  Calcium Carbonate-Vit D-Min (CALCIUM 600+D PLUS MINERALS) 600-400 MG-UNIT TABS Take 1 tablet by mouth daily.    Yes [provider]  diclofenac sodium (VOLTAREN) 1 % GEL Apply 1 application topically 4 (four) times daily as needed (knee pain.).    Yes [provider]  DULoxetine (CYMBALTA) 60 MG capsule Take 1 capsule (60 mg total) by mouth daily. 10/19/20  Yes Burnard Hawthorne, FNP  Lancets [facility] DELICA PLUS [id number]) MISC USE AS INSTRUCTED 02/24/20  Yes Burnard Hawthorne, FNP  levothyroxine (SYNTHROID) 137 MCG tablet Take 1 tablet (137 mcg total) by mouth daily before breakfast. 09/21/20  Yes Arnett, Yvetta Coder, FNP  losartan-hydrochlorothiazide (HYZAAR) 50-12.5 MG tablet Take 1 tablet by mouth daily. 12/04/20  Yes Arnett, Yvetta Coder, FNP  meloxicam (MOBIC) 7.5 MG tablet TAKE 1 TABLET (7.5 MG TOTAL) BY MOUTH DAILY AS NEEDED FOR PAIN. TAKE WITH FOOD.  06/08/20  Yes Burnard Hawthorne, FNP  metFORMIN (GLUCOPHAGE) 500 MG tablet TAKE 1 TABLET BY MOUTH TWICE A DAY 12/03/20  Yes Arnett, Yvetta Coder, FNP  metoprolol tartrate (LOPRESSOR) 25 MG tablet Take 1 tablet (25 mg total) by mouth 2 (two) times daily. 04/17/19  Yes Burnard Hawthorne, FNP  ONETOUCH ULTRA test strip TEST BLOOD SUGAR 1-2 TIMES A DAY 12/04/20  Yes Burnard Hawthorne, FNP    Allergies as of 11/19/2020  . (No Known Allergies)    Family History  Problem Relation Age of Onset  . Hypertension Mother   . Hypertension Father   . Diabetes Sister    . Thyroid disease Sister   . Breast cancer Maternal Aunt   . Breast cancer Maternal Aunt     Social History   Socioeconomic History  . Marital status: Married    Spouse name: Not on file  . Number of children: 4  . Years of education: Not on file  . Highest education level: Not on file  Occupational History  . Not on file  Tobacco Use  . Smoking status: Former Smoker    Packs/day: 0.75    Years: 48.00    Pack years: 36.00    Types: Cigarettes    Quit date: 2015    Years since quitting: 7.3  . Smokeless tobacco: Former Systems developer    Types: Snuff, Chew  Vaping Use  . Vaping Use: Never used  Substance and Sexual Activity  . Alcohol use: No  . Drug use: No  . Sexual activity: Not Currently  Other Topics Concern  . Not on file  Social History Narrative   Lives in [place] with husband. Has 4 children.      4 grandchildren.      Work - Retired from [employer]- walking the track, 4x per week      Diet- regular      Social Determinants of Radio broadcast assistant Strain: Low Risk   . Difficulty of Paying Living Expenses: Not hard at all  Food Insecurity: No Food Insecurity  . Worried About Charity fundraiser in the Last Year: Never true  . Ran Out of Food in the Last Year: Never true  Transportation Needs: No Transportation Needs  . Lack of Transportation (Medical): No  . Lack of Transportation (Non-Medical): No  Physical Activity: Not on file  Stress: No Stress Concern Present  . Feeling of Stress : Not at all  Social Connections: Socially Integrated  . Frequency of Communication with Friends and Family: More than three times a week  . Frequency of Social Gatherings with Friends and Family: Once a week  . Attends Religious Services: More than 4 times per year  . Active Member of Clubs or Organizations: Yes  . Attends Archivist Meetings: More than 4 times per year  . Marital Status: Married  Human resources officer Violence: Not At Risk   . Fear of Current or Ex-Partner: No  . Emotionally Abused: No  . Physically Abused: No  . Sexually Abused: No    Review of Systems: See HPI, otherwise negative ROS  Physical Exam: BP 136/85   Pulse 95   Temp 98.1 F (36.7 C) (Temporal)   Resp 20   Ht 5\' 9"  (1.753 m)   Wt 104.3 kg   SpO2 98%   BMI 33.97 kg/m  General:   Alert,  pleasant and cooperative in NAD Head:  Normocephalic and  atraumatic. Neck:  Supple; no masses or thyromegaly. Lungs:  Clear throughout to auscultation, normal respiratory effort.    Heart:  +S1, +S2, Regular rate and rhythm, No edema. Abdomen:  Soft, nontender and nondistended. Normal bowel sounds, without guarding, and without rebound.   Neurologic:  Alert and  oriented x4;  grossly normal neurologically.  Impression/Plan: Cynthia [name] is here for an colonoscopy to be performed for surveillance due to prior history of colon polyps   Risks, benefits, limitations, and alternatives regarding  colonoscopy have been reviewed with the patient.  Questions have been answered.  All parties agreeable.   [name], MD  02/08/2021, 9:18 AM

## 2021-02-09 ENCOUNTER — Encounter: Payer: Self-pay | Admitting: Gastroenterology

## 2021-02-10 LAB — SURGICAL PATHOLOGY

## 2021-02-11 ENCOUNTER — Encounter: Payer: Self-pay | Admitting: Gastroenterology

## 2021-02-11 ENCOUNTER — Other Ambulatory Visit: Payer: Self-pay | Admitting: Family

## 2021-02-11 DIAGNOSIS — M25562 Pain in left knee: Secondary | ICD-10-CM

## 2021-02-11 DIAGNOSIS — G8929 Other chronic pain: Secondary | ICD-10-CM

## 2021-02-16 DIAGNOSIS — Z03818 Encounter for observation for suspected exposure to other biological agents ruled out: Secondary | ICD-10-CM | POA: Diagnosis not present

## 2021-02-16 DIAGNOSIS — Z20822 Contact with and (suspected) exposure to covid-19: Secondary | ICD-10-CM | POA: Diagnosis not present

## 2021-02-23 ENCOUNTER — Other Ambulatory Visit: Payer: Self-pay | Admitting: Interventional Radiology

## 2021-02-23 DIAGNOSIS — N2889 Other specified disorders of kidney and ureter: Secondary | ICD-10-CM

## 2021-03-08 ENCOUNTER — Other Ambulatory Visit: Payer: Self-pay

## 2021-03-08 ENCOUNTER — Ambulatory Visit: Payer: Medicare Other | Admitting: Family

## 2021-03-08 ENCOUNTER — Ambulatory Visit
Admission: RE | Admit: 2021-03-08 | Discharge: 2021-03-08 | Disposition: A | Discharge status: Home / Self Care | Payer: Medicare Other | Source: Ambulatory Visit | Attending: Interventional Radiology | Admitting: Interventional Radiology

## 2021-03-08 DIAGNOSIS — Z0289 Encounter for other administrative examinations: Secondary | ICD-10-CM

## 2021-03-08 DIAGNOSIS — N281 Cyst of kidney, acquired: Secondary | ICD-10-CM | POA: Diagnosis not present

## 2021-03-08 DIAGNOSIS — N2889 Other specified disorders of kidney and ureter: Secondary | ICD-10-CM | POA: Diagnosis not present

## 2021-03-08 IMAGING — MR MR ABDOMEN WO/W CM
18 series · 48 of 48 positions shown · IV contrast (gadavist)
Comparison: [DATE]

CLINICAL DATA: Status post cryoablation of left renal mass. Routine
surveillance

EXAM:
MRI ABDOMEN WITHOUT AND WITH CONTRAST
TECHNIQUE: Multiplanar multisequence MR imaging of the abdomen was performed
both before and after the administration of intravenous contrast.
CONTRAST:  10mL GADAVIST GADOBUTROL 1 MMOL/ML IV SOLN

[Series 3: T2 · coronal · 6.0mm · 1.19mm/px · 2 of 30 slices shown (1 of 2)]
[im 1/30]
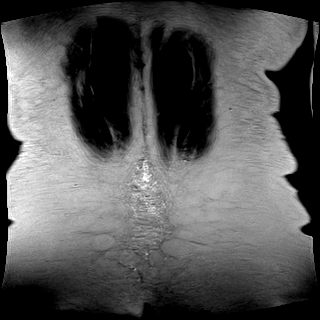
[im 30/30]
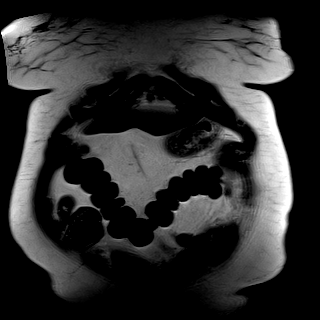

[Series 4: T2 · axial · 6.0mm · 1.19mm/px · z∈[-92,+131]mm · 2 of 32 slices shown (2 of 2)]
[im 1/32]
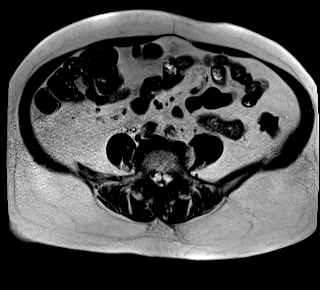
[im 32/32]
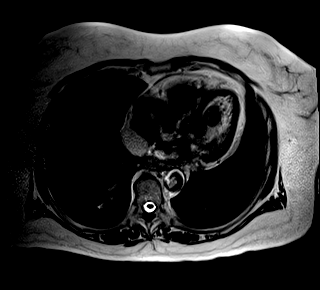

[Series 6: T2 fat-sat · axial · 6.0mm · 1.19mm/px · z∈[-90,+148]mm · 2 of 34 slices shown]
[im 1/34]
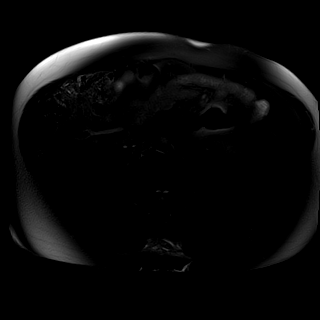
[im 34/34]
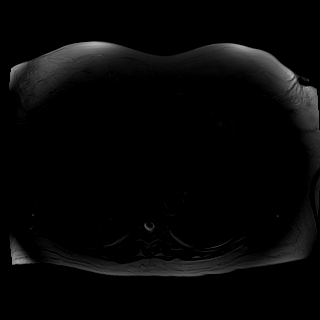

[Series 7: ax dwi_tracew · axial · 6.0mm · 1.42mm/px · z∈[-90,+148]mm · 4 of 102 slices shown]
[im 1/102]
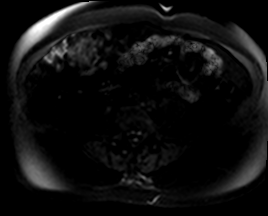
[im 34/102]
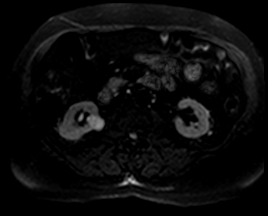
[im 68/102]
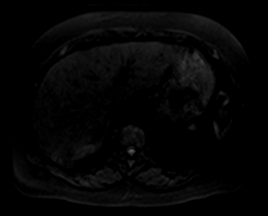
[im 102/102]
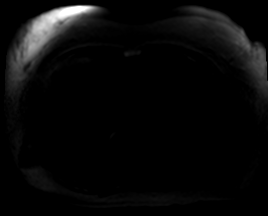

[Series 8: ax dwi_adc · axial · 6.0mm · 1.42mm/px · 1 of 34 slices shown]
[im 1/34]
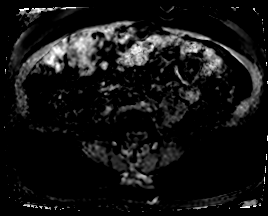

[Series 9: in & out · axial · 3.0mm · 1.19mm/px · z∈[-87,+126]mm · 3 of 72 slices shown (1 of 2)]
[im 1/72]
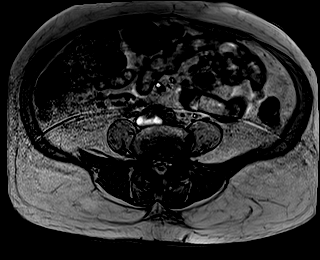
[im 36/72]
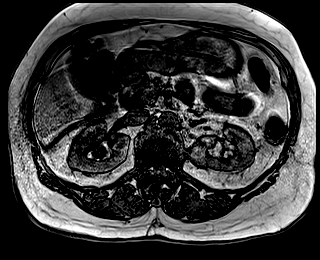
[im 72/72]
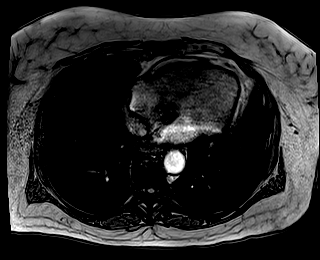

[Series 10: in & out · axial · 3.0mm · 1.19mm/px · z∈[-87,+126]mm · 3 of 72 slices shown (2 of 2)]
[im 1/72]
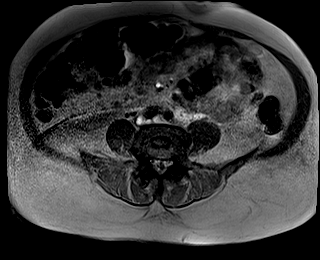
[im 36/72]
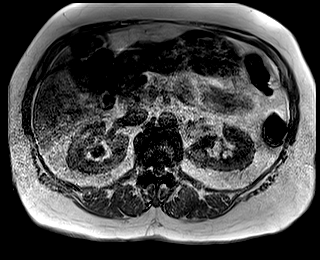
[im 72/72]
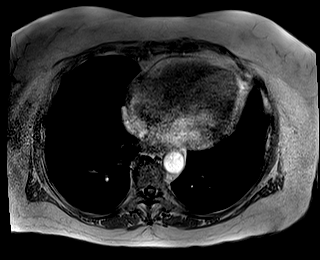

[Series 11: bSSFP · axial · 6.0mm · 0.74mm/px · 1 of 32 slices shown]
[im 1/32]
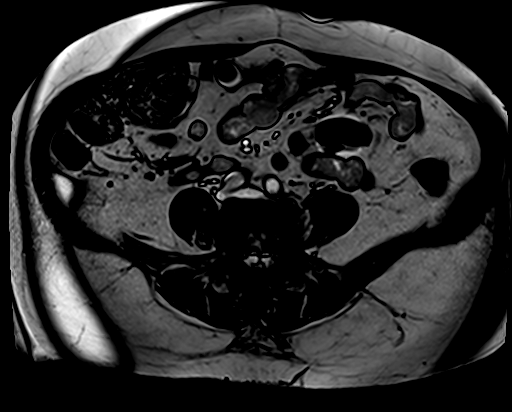

[Series 12: T1 dynamic fat-sat · axial · non-contrast · 3.0mm · 1.19mm/px · z∈[-84,+153]mm · 3 of 80 slices shown (1 of 5)]
[im 1/80]
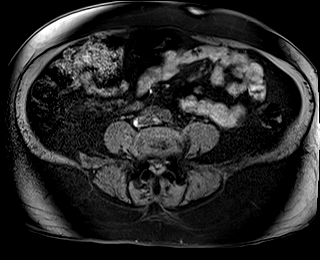
[im 40/80]
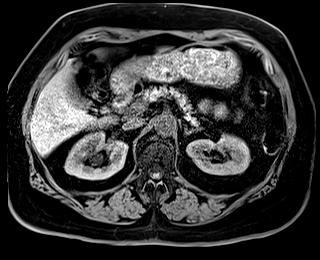
[im 80/80]
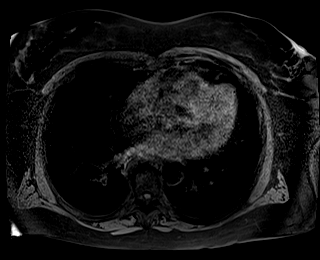

[Series 13: T1 dynamic fat-sat post-contrast · axial · 3.0mm · 1.19mm/px · z∈[-84,+153]mm · 3 of 80 slices shown (1 of 4)]
[im 1/80]
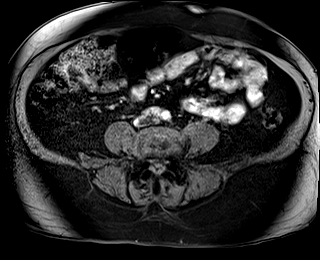
[im 40/80]
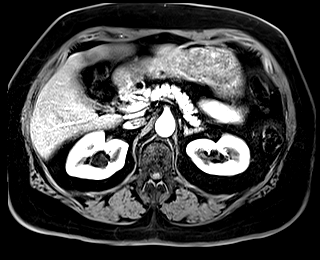
[im 80/80]
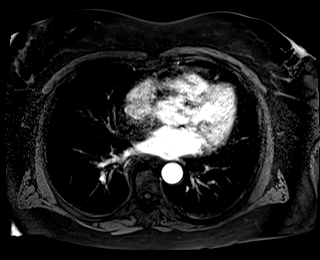

[Series 14: T1 dynamic fat-sat · axial · 3.0mm · 1.19mm/px · z∈[-84,+153]mm · 3 of 80 slices shown (2 of 5)]
[im 1/80]
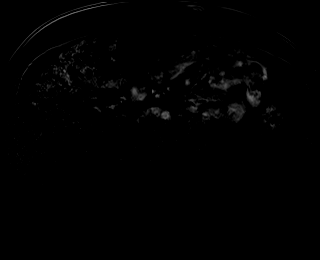
[im 40/80]
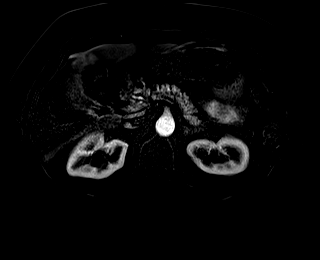
[im 80/80]
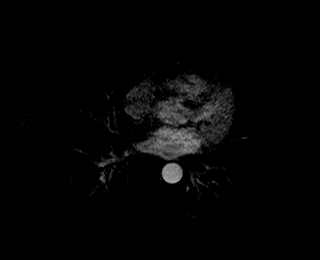

[Series 15: T1 dynamic fat-sat post-contrast · axial · 3.0mm · 1.19mm/px · z∈[-84,+153]mm · 3 of 80 slices shown (2 of 4)]
[im 1/80]
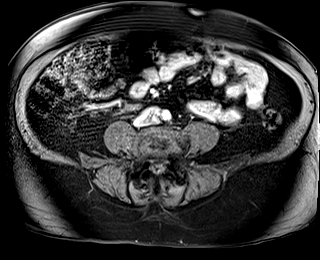
[im 40/80]
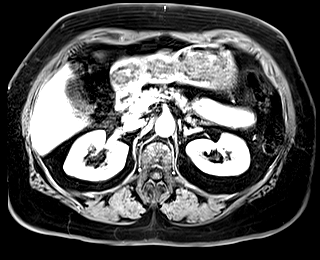
[im 80/80]
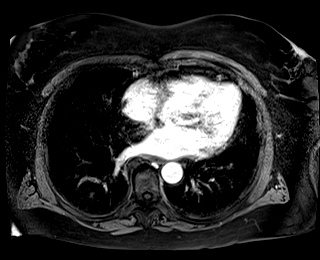

[Series 16: T1 dynamic fat-sat · axial · 3.0mm · 1.19mm/px · z∈[-84,+153]mm · 3 of 80 slices shown (3 of 5)]
[im 1/80]
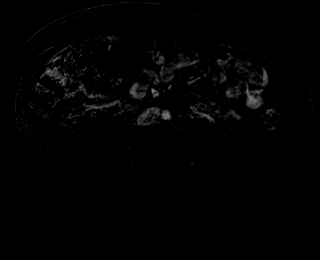
[im 40/80]
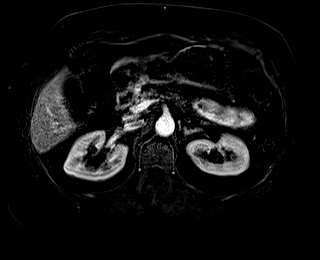
[im 80/80]
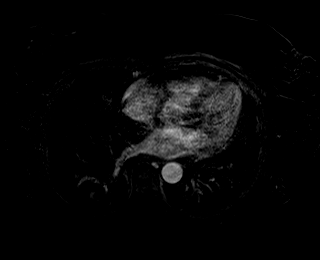

[Series 17: T1 dynamic fat-sat post-contrast · axial · 3.0mm · 1.19mm/px · z∈[-84,+153]mm · 3 of 80 slices shown (3 of 4)]
[im 1/80]
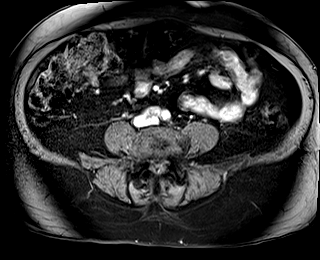
[im 40/80]
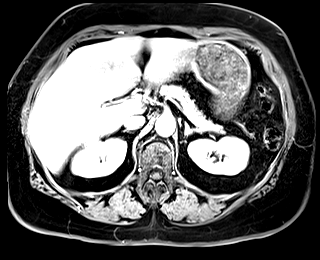
[im 80/80]
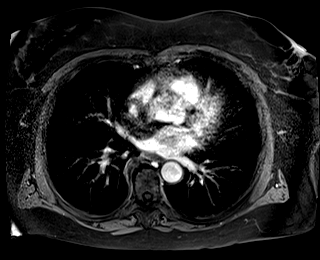

[Series 18: T1 dynamic fat-sat · axial · 3.0mm · 1.19mm/px · z∈[-84,+153]mm · 3 of 80 slices shown (4 of 5)]
[im 1/80]
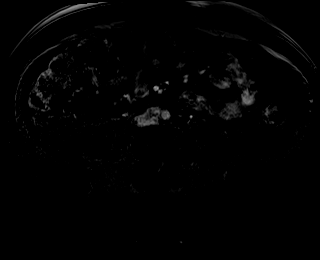
[im 40/80]
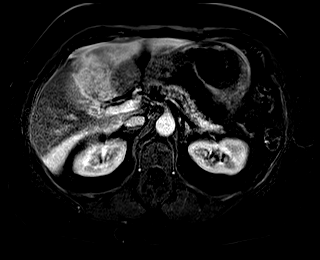
[im 80/80]
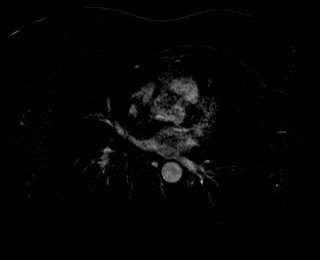

[Series 19: T1 dynamic post-contrast · coronal · 3.0mm · 1.31mm/px · 3 of 72 slices shown]
[im 1/72]
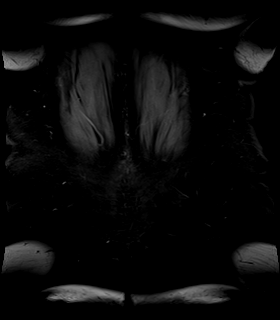
[im 36/72]
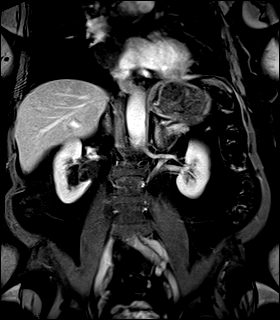
[im 72/72]
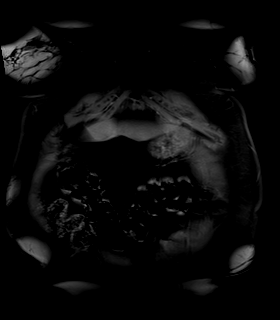

[Series 20: T1 dynamic fat-sat post-contrast · axial · 3.0mm · 1.19mm/px · z∈[-84,+153]mm · 3 of 80 slices shown (4 of 4)]
[im 1/80]
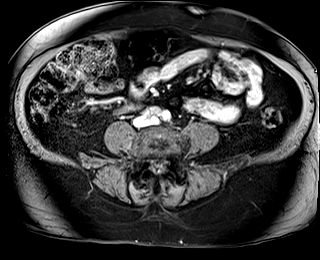
[im 40/80]
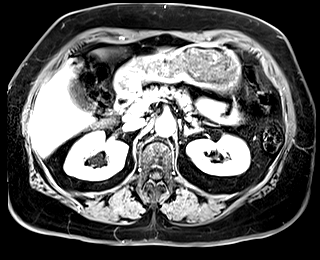
[im 80/80]
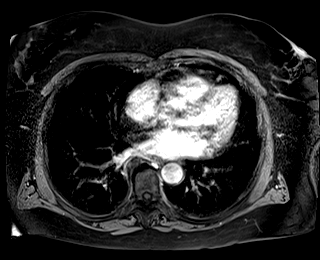

[Series 21: T1 dynamic fat-sat · axial · 3.0mm · 1.19mm/px · z∈[-84,+153]mm · 3 of 80 slices shown (5 of 5)]
[im 1/80]
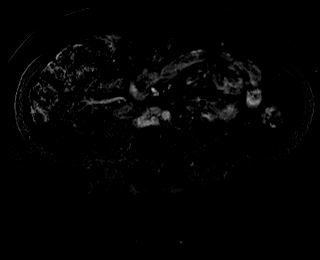
[im 40/80]
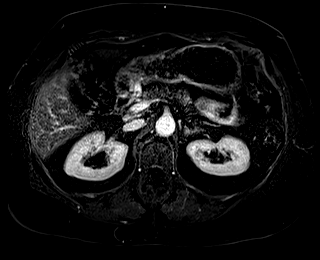
[im 80/80]
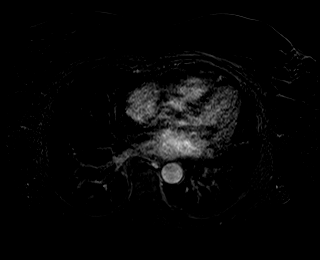

[48 of 48 positions shown; findings below may reference images not displayed]

FINDINGS: Lower chest: No acute findings.

Hepatobiliary: No mass or other parenchymal abnormality identified.
Gallbladder normal. No biliary ductal dilatation.

Pancreas: No mass, inflammatory changes, or other parenchymal
abnormality identified.

Spleen:  Within normal limits in size and appearance.

Adrenals/Urinary Tract:  The adrenal glands appear normal.

The cryoablation zone within the medial aspect of the interpolar
left kidney is again noted. This shows T1 hyperintensity and
heterogeneous T2 hypointensity compatible with blood products and/or
necrosis following ablation. The ablation zone measures 2.4 x 2.4 by
2.8 cm (volume = 8.4 cm^3), image [DATE] and image [DATE]. Formally,
2.7 x 2.7 x 2.8 cm (volume = 11 cm^3). On the postcontrast images
there are no abnormal areas of enhancement to suggest residual or
recurrent disease. Subtraction images confirm lack of internal
enhancement within this area.

No additional focal kidney lesions. Unchanged medial right kidney
parapelvic cyst.

No hydronephrosis or perinephric fluid collections identified
bilaterally.

Stomach/Bowel: Visualized portions within the abdomen are
unremarkable.

Vascular/Lymphatic: No pathologically enlarged lymph nodes
identified. No abdominal aortic aneurysm demonstrated.

Other:  No free fluid or fluid collections.

Musculoskeletal: No suspicious bone lesions identified.
IMPRESSION: 1. Post ablation changes involving the medial aspect of the left
kidney without signs of residual or recurrent disease at the
ablation site.
2. No signs of metastatic disease within the abdomen.

## 2021-03-08 MED ORDER — GADOBUTROL 1 MMOL/ML IV SOLN
10.0000 mL | Freq: Once | INTRAVENOUS | Status: AC | PRN
  Administered 2021-03-08: 10 mL via INTRAVENOUS

## 2021-03-10 ENCOUNTER — Ambulatory Visit
Admission: RE | Admit: 2021-03-10 | Discharge: 2021-03-10 | Disposition: A | Discharge status: Home / Self Care | Payer: Medicare Other | Source: Ambulatory Visit | Attending: Interventional Radiology | Admitting: Interventional Radiology

## 2021-03-10 ENCOUNTER — Encounter: Payer: Self-pay | Admitting: *Deleted

## 2021-03-10 ENCOUNTER — Other Ambulatory Visit: Payer: Self-pay

## 2021-03-10 DIAGNOSIS — C649 Malignant neoplasm of unspecified kidney, except renal pelvis: Secondary | ICD-10-CM | POA: Diagnosis not present

## 2021-03-10 DIAGNOSIS — N2889 Other specified disorders of kidney and ureter: Secondary | ICD-10-CM

## 2021-03-10 HISTORY — PX: IR RADIOLOGIST EVAL & MGMT: IMG5224

## 2021-03-10 NOTE — Progress Notes (Signed)
Patient ID: Cynthia [name], female   DOB: 02-Oct-1945, 75 y.o.   MRN: [id number]       Chief Complaint: Patient was consulted remotely today (TeleHealth) for renal cell carcinoma post ablation at the request of Othelia Riederer.    Referring Physician(s): Nickolas Madrid, MD  History of Present Illness: Cynthia [name] is a 75 y.o. female With a history of neuroendocrine colon carcinoma and pulmonary nodules. 02/05/2018 PET/CT showed no residual/recurrent disease related to her colon carcinoma.  Small low-attenuation left renal lesion is in retrospect noted. 02/19/2020 on a follow-up CT chest study, interval increase in size of left renal mass was identified 03/02/2020 renal ultrasound confirms solitary 2.7 cm left mid renal mass 03/09/2020 MR confirms 2.1 cm complex left upper pole renal lesion, partially exophytic.  No regional invasion or adenopathy. The patient is asymptomatic from  this lesion.  No renal insufficiency.  No hematuria, flank pain, weight loss.  No history of kidney cancer.  Positive history of tobacco abuse. 04/29/2020 patient underwent CT-guided core biopsy and cryoablation of the left renal mass.  Surgical  pathology consistent with clear-cell carcinoma.  She did well postprocedure and was discharged home the following day as scheduled.  Minimal discomfort at the treatment site x2 days.  No hematuria. 09/25/2020 MRI abdomen without and with contrast showed post ablation change with no residual or recurrent tumor.  No regional adenopathy.  Nodular density seen in the right lung base of the lung. She remains asymptomatic.   No symptoms in the left flank in the region of treatment.  No hematuria or flank pain.  She had a follow-up MR 2 days ago. 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 denies   Diabetes mellitus without  complication ([place])    type 2   GERD (gastroesophageal reflux disease)    Hyperlipidemia    Hypertension    Hypothyroidism    Lung nodule    Thyroid disease     Past Surgical History:  Procedure Laterality Date   BREAST BIOPSY Left 11/25/2015   stereo  neg   BREAST EXCISIONAL BIOPSY Left yrs ago   benign   COLONOSCOPY WITH PROPOFOL N/A 04/20/2017   Procedure: COLONOSCOPY WITH PROPOFOL;  Surgeon: [name], MD;  Location: [facility] ENDOSCOPY;  Service: Endoscopy;  Laterality: N/A;   COLONOSCOPY WITH PROPOFOL N/A 01/08/2018   Procedure: COLONOSCOPY WITH PROPOFOL;  Surgeon: [name], MD;  Location: [facility] ENDOSCOPY;  Service: Gastroenterology;  Laterality: N/A;   COLONOSCOPY WITH PROPOFOL N/A 08/10/2018   Procedure: COLONOSCOPY WITH PROPOFOL;  Surgeon: [name], MD;  Location: [facility] ENDOSCOPY;  Service: Gastroenterology;  Laterality: N/A;   COLONOSCOPY WITH PROPOFOL N/A 02/08/2021   Procedure: COLONOSCOPY WITH PROPOFOL;  Surgeon: [name], MD;  Location: [facility] ENDOSCOPY;  Service: Gastroenterology;  Laterality: N/A;   IR RADIOLOGIST EVAL & MGMT  04/08/2020   IR RADIOLOGIST EVAL & MGMT  05/26/2020   IR RADIOLOGIST EVAL & MGMT  09/29/2020   RADIOLOGY WITH ANESTHESIA N/A 04/29/2020   Procedure: CRYOABLATION;  Surgeon: Arne Cleveland, MD;  Location: [facility] ORS;  Service: Radiology;  Laterality: N/A;   THYROIDECTOMY     Dr. [name]   VAGINAL DELIVERY     4    Allergies: Patient has no known allergies.  Medications: Prior to Admission medications  Medication Sig Start Date End Date Taking? Authorizing Provider  albuterol (VENTOLIN HFA) 108 (90 Base) MCG/ACT inhaler Inhale 2 puffs into the lungs every 6 (six) hours as needed for wheezing or shortness of breath. 08/24/20   Burnard Hawthorne, FNP  amLODipine (NORVASC) 10 MG tablet TAKE 1 TABLET (10 MG TOTAL) BY MOUTH DAILY. 03/13/20   Burnard Hawthorne, FNP  aspirin EC 81 MG tablet Take 81 mg by mouth daily. Swallow whole.    [provider]  atorvastatin (LIPITOR) 20 MG tablet Take 1 tablet (20 mg total) by mouth daily. 12/04/20   Burnard Hawthorne, FNP  Calcium Carbonate-Vit D-Min (CALCIUM 600+D PLUS MINERALS) 600-400 MG-UNIT TABS Take 1 tablet by mouth daily.     [provider]  diclofenac sodium (VOLTAREN) 1 % GEL Apply 1 application topically 4 (four) times daily as needed (knee pain.).     [provider]  DULoxetine (CYMBALTA) 60 MG capsule Take 1 capsule (60 mg total) by mouth daily. 10/19/20   Burnard Hawthorne, FNP  Lancets [facility] DELICA PLUS [id number]) MISC USE AS INSTRUCTED 02/24/20   Burnard Hawthorne, FNP  levothyroxine (SYNTHROID) 137 MCG tablet Take 1 tablet (137 mcg total) by mouth daily before breakfast. 09/21/20   Burnard Hawthorne, FNP  losartan-hydrochlorothiazide (HYZAAR) 50-12.5 MG tablet Take 1 tablet by mouth daily. 12/04/20   Burnard Hawthorne, FNP  meloxicam (MOBIC) 7.5 MG tablet TAKE 1 TABLET (7.5 MG TOTAL) BY MOUTH DAILY AS NEEDED FOR PAIN. TAKE WITH FOOD. 02/11/21   Burnard Hawthorne, FNP  metFORMIN (GLUCOPHAGE) 500 MG tablet TAKE 1 TABLET BY MOUTH TWICE A DAY 12/03/20   Burnard Hawthorne, FNP  metoprolol tartrate (LOPRESSOR) 25 MG tablet Take 1 tablet (25 mg total) by mouth 2 (two) times daily. 04/17/19   Burnard Hawthorne, FNP  ONETOUCH ULTRA test strip TEST BLOOD SUGAR 1-2 TIMES A DAY 12/04/20   Burnard Hawthorne, FNP     Family History  Problem Relation Age of Onset   Hypertension Mother    Hypertension Father    Diabetes Sister    Thyroid disease Sister    Breast cancer Maternal Aunt    Breast cancer Maternal Aunt     Social History   Socioeconomic History   Marital status: Married    Spouse name: Not on file   Number of children: 4   Years of education: Not on file   Highest education level: Not on file  Occupational History   Not on file  Tobacco Use   Smoking status: Former    Packs/day: 0.75    Years: 48.00    Pack years: 36.00    Types:  Cigarettes    Quit date: 2015    Years since quitting: 7.4   Smokeless tobacco: Former    Types: Snuff, Chew  Vaping Use   Vaping Use: Never used  Substance and Sexual Activity   Alcohol use: No   Drug use: No   Sexual activity: Not Currently  Other Topics Concern   Not on file  Social History Narrative   Lives in [place] with husband. Has 4 children.      4 grandchildren.      Work - Retired from [employer]- walking the track, 4x per week      Diet- regular      Social Determinants of Radio broadcast assistant Strain: Low Risk    Difficulty of Paying  Living Expenses: Not hard at all  Food Insecurity: No Food Insecurity   Worried About [place] in the Last Year: Never true   Ran Out of Food in the Last Year: Never true  Transportation Needs: No Transportation Needs   Lack of Transportation (Medical): No   Lack of Transportation (Non-Medical): No  Physical Activity: Not on file  Stress: No Stress Concern Present   Feeling of Stress : Not at all  Social Connections: Socially Integrated   Frequency of Communication with Friends and Family: More than three times a week   Frequency of Social Gatherings with Friends and Family: Once a week   Attends Religious Services: More than 4 times per year   Active Member of [employer] or Organizations: Yes   Attends Music therapist: More than 4 times per year   Marital Status: Married    ECOG Status: 0 - Asymptomatic  Review of Systems  Review of Systems: A 12 point ROS discussed and pertinent positives are indicated in the HPI above.  All other systems are negative.  Physical Exam No direct physical exam was performed (except for noted visual exam findings with Video Visits).     Vital Signs: There were no vitals taken for this visit.  Imaging: MR ABDOMEN WWO CONTRAST  Result Date: 03/08/2021 CLINICAL DATA:  Status post cryoablation of left renal mass. Routine surveillance EXAM:  MRI ABDOMEN WITHOUT AND WITH CONTRAST TECHNIQUE: Multiplanar multisequence MR imaging of the abdomen was performed both before and after the administration of intravenous contrast. CONTRAST:  69mL GADAVIST GADOBUTROL 1 MMOL/ML IV SOLN COMPARISON:  09/25/20 FINDINGS: Lower chest: No acute findings. Hepatobiliary: No mass or other parenchymal abnormality identified. Gallbladder normal. No biliary ductal dilatation. Pancreas: No mass, inflammatory changes, or other parenchymal abnormality identified. Spleen:  Within normal limits in size and appearance. Adrenals/Urinary Tract:  The adrenal glands appear normal. The cryoablation zone within the medial aspect of the interpolar left kidney is again noted. This shows T1 hyperintensity and heterogeneous T2 hypointensity compatible with blood products and/or necrosis following ablation. The ablation zone measures 2.4 x 2.4 by 2.8 cm (volume = 8.4 cm^3), image 17/4 and image 11/3. Formally, 2.7 x 2.7 x 2.8 cm (volume = 11 cm^3). On the postcontrast images there are no abnormal areas of enhancement to suggest residual or recurrent disease. Subtraction images confirm lack of internal enhancement within this area. No additional focal kidney lesions. Unchanged medial right kidney parapelvic cyst. No hydronephrosis or perinephric fluid collections identified bilaterally. Stomach/Bowel: Visualized portions within the abdomen are unremarkable. Vascular/Lymphatic: No pathologically enlarged lymph nodes identified. No abdominal aortic aneurysm demonstrated. Other:  No free fluid or fluid collections. Musculoskeletal: No suspicious bone lesions identified. IMPRESSION: 1. Post ablation changes involving the medial aspect of the left kidney without signs of residual or recurrent disease at the ablation site. 2. No signs of metastatic disease within the abdomen. Electronically Signed   By: Kerby Moors M.D.   On: 03/08/2021 22:11    Labs:  CBC: Recent Labs    03/11/20 1028  04/27/20 1112 10/16/20 1101  WBC 4.5 4.8 3.9*  HGB 13.9 14.2 13.9  HCT 40.4 42.9 40.8  PLT 273 257 272    COAGS: Recent Labs    04/27/20 1112  INR 1.0  APTT 26    BMP: Recent Labs    03/11/20 1028 04/27/20 1112 04/30/20 0513 06/12/20 1047 10/16/20 1101 12/31/20 0835  NA 138 138 138 138 134* 140  K  4.0 4.0 4.1 4.4 3.6 4.0  CL 101 102 101 100 96* 100  CO2 28 27 24 27 28 23   GLUCOSE 124* 82 154* 110* 144* 154*  BUN 14 12 15 10 12 14   CALCIUM 9.2 9.9 8.8* 9.7 9.4 9.9  CREATININE 0.83 0.62 0.66 0.63 0.60 0.75  GFRNONAA >60 >60 >60 89 >60  --   GFRAA >60 >60 >60 102  --   --     LIVER FUNCTION TESTS: Recent Labs    03/11/20 1028 10/16/20 1101  BILITOT 0.7 0.7  AST 28 29  ALT 26 27  ALKPHOS 59 62  PROT 7.3 7.3  ALBUMIN 4.1 3.9    TUMOR MARKERS: No results for input(s): AFPTM, CEA, CA199, CHROMGRNA in the last 8760 hours.  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  No residual/recurrent tumor or evidence of regional adenopathy  on follow-up MRI. She is essentially asymptomatic from the lesion and from the procedure.  I am satisfied with her progress thus far.    We discussed the importance of continued imaging surveillance for at least 5 years to  exclude any residual/recurrent neoplasm.  We discussed the anticipation of curative treatment.  She seemed to understand and did ask appropriate questions which were answered. Will set up MR with contrast in about 6-9 months and follow-up with her after I see those results. She knows to telephone with any questions or problems regarding the procedure in the interval if needed.  Thank you for this interesting consult.  I greatly enjoyed meeting Cynthia [name] and look forward to participating in their care.  A copy of this report was sent to the requesting provider on this date.  Electronically Signed: Rickard Rhymes 03/10/2021, 8:36 AM   I spent a total of    15 Minutes in remote  clinical consultation, greater than 50% of which was counseling/coordinating care for left clear-cell renal cell carcinoma, post ablation.    Visit type: Audio only (telephone). Audio (no video) only due to patient's lack of internet/smartphone capability. Alternative for in-person consultation at [facility], [address] [place], [place], [place]. This visit type was conducted due to national recommendations for restrictions regarding the COVID-19 Pandemic (e.g. social distancing).  This format is felt to be most appropriate for this patient at this time.  All issues noted in this document were discussed and addressed.

## 2021-03-11 ENCOUNTER — Other Ambulatory Visit: Payer: Self-pay | Admitting: Family

## 2021-03-11 DIAGNOSIS — E785 Hyperlipidemia, unspecified: Secondary | ICD-10-CM

## 2021-03-23 ENCOUNTER — Other Ambulatory Visit: Payer: Self-pay | Admitting: Family

## 2021-03-23 DIAGNOSIS — Z1231 Encounter for screening mammogram for malignant neoplasm of breast: Secondary | ICD-10-CM

## 2021-04-06 ENCOUNTER — Other Ambulatory Visit: Payer: Self-pay | Admitting: Family

## 2021-04-12 ENCOUNTER — Ambulatory Visit: Payer: Medicare Other | Admitting: Podiatry

## 2021-04-14 ENCOUNTER — Inpatient Hospital Stay: Payer: Medicare Other | Attending: Oncology

## 2021-04-14 DIAGNOSIS — C7A8 Other malignant neuroendocrine tumors: Secondary | ICD-10-CM | POA: Diagnosis not present

## 2021-04-14 LAB — CBC WITH DIFFERENTIAL/PLATELET
Abs Immature Granulocytes: 0.01 10*3/uL (ref 0.00–0.07)
Basophils Absolute: 0.1 10*3/uL (ref 0.0–0.1)
Basophils Relative: 2 %
Eosinophils Absolute: 0.3 10*3/uL (ref 0.0–0.5)
Eosinophils Relative: 7 %
HCT: 40.3 % (ref 36.0–46.0)
Hemoglobin: 13.4 g/dL (ref 12.0–15.0)
Immature Granulocytes: 0 %
Lymphocytes Relative: 38 %
Lymphs Abs: 1.7 10*3/uL (ref 0.7–4.0)
MCH: 30.7 pg (ref 26.0–34.0)
MCHC: 33.3 g/dL (ref 30.0–36.0)
MCV: 92.4 fL (ref 80.0–100.0)
Monocytes Absolute: 0.7 10*3/uL (ref 0.1–1.0)
Monocytes Relative: 17 %
Neutro Abs: 1.6 10*3/uL — ABNORMAL LOW (ref 1.7–7.7)
Neutrophils Relative %: 36 %
Platelets: 273 10*3/uL (ref 150–400)
RBC: 4.36 MIL/uL (ref 3.87–5.11)
RDW: 13.2 % (ref 11.5–15.5)
WBC: 4.4 10*3/uL (ref 4.0–10.5)
nRBC: 0 % (ref 0.0–0.2)

## 2021-04-14 LAB — COMPREHENSIVE METABOLIC PANEL
ALT: 22 U/L (ref 0–44)
AST: 26 U/L (ref 15–41)
Albumin: 4.1 g/dL (ref 3.5–5.0)
Alkaline Phosphatase: 69 U/L (ref 38–126)
Anion gap: 5 (ref 5–15)
BUN: 14 mg/dL (ref 8–23)
CO2: 26 mmol/L (ref 22–32)
Calcium: 8.9 mg/dL (ref 8.9–10.3)
Chloride: 102 mmol/L (ref 98–111)
Creatinine, Ser: 0.61 mg/dL (ref 0.44–1.00)
GFR, Estimated: >60 mL/min (ref >60)
Glucose, Bld: 89 mg/dL (ref 70–99)
Potassium: 3.5 mmol/L (ref 3.5–5.1)
Sodium: 133 mmol/L — ABNORMAL LOW (ref 135–145)
Total Bilirubin: 0.5 mg/dL (ref 0.3–1.2)
Total Protein: 7.1 g/dL (ref 6.5–8.1)

## 2021-04-14 LAB — LACTATE DEHYDROGENASE: LDH: 148 U/L (ref 98–192)

## 2021-04-15 ENCOUNTER — Inpatient Hospital Stay: Payer: Medicare Other

## 2021-04-15 ENCOUNTER — Telehealth: Payer: Self-pay | Admitting: Oncology

## 2021-04-15 LAB — CHROMOGRANIN A: Chromogranin A (ng/mL): 36.6 ng/mL (ref 0.0–101.8)

## 2021-04-15 NOTE — Telephone Encounter (Signed)
Pt rescheduled to 8/9. Pt is aware.

## 2021-04-15 NOTE — Telephone Encounter (Signed)
Called PT to inform about rescheduling appt for 08/04 to another date. Left message for PT to call Dr. Collie Siad office back reschedule her appt.

## 2021-04-18 DIAGNOSIS — C7A8 Other malignant neuroendocrine tumors: Secondary | ICD-10-CM | POA: Diagnosis not present

## 2021-04-19 ENCOUNTER — Other Ambulatory Visit: Payer: Self-pay

## 2021-04-19 DIAGNOSIS — C7A8 Other malignant neuroendocrine tumors: Secondary | ICD-10-CM

## 2021-04-20 ENCOUNTER — Other Ambulatory Visit: Payer: Self-pay

## 2021-04-20 ENCOUNTER — Ambulatory Visit (INDEPENDENT_AMBULATORY_CARE_PROVIDER_SITE_OTHER): Payer: Medicare Other

## 2021-04-20 VITALS — BP 109/71 | HR 70 | Temp 97.3°F | Resp 15 | Ht 69.0 in | Wt 230.8 lb

## 2021-04-20 DIAGNOSIS — Z0000 Encounter for general adult medical examination without abnormal findings: Secondary | ICD-10-CM

## 2021-04-20 NOTE — Patient Instructions (Addendum)
Cynthia [name] , Thank you for taking time to come for your Medicare Wellness Visit. I appreciate your ongoing commitment to your health goals. Please review the following plan we discussed and let me know if I can assist you in the future.   These are the goals we discussed:  Goals      Follow up with Primary Care Provider     As needed     Maintain weight     STAY HYDRATED AND DRINK PLENTY OF FLUIDS.  INCREASE WATER INTAKE. LOW CARB FOODS.  LEAN MEATS (CHICKEN, [place], FISH).  VEGETABLES, FRUITS. STRETCH AND STAY ACTIVE BY CONTINUING TO WALK WITH SISTER WHEN THE WEATHER PERMITS.  3 DAYS A WEEK FOR 30 MINUTES.           This is a list of the screening recommended for you and due dates:  Health Maintenance  Topic Date Due   Eye exam for diabetics  09/02/2020   Hemoglobin A1C  04/18/2021   Flu Shot  04/19/2021   Zoster (Shingles) Vaccine (1 of 2) 07/21/2021*   COVID-19 Vaccine (3 - Pfizer risk series) 10/20/2021*   Complete foot exam   06/16/2021   Colon Cancer Screening  02/09/2024   Tetanus Vaccine  05/17/2026   DEXA scan (bone density measurement)  Completed   Hepatitis C Screening: USPSTF Recommendation to screen - Ages 31-79 yo.  Completed   Pneumonia vaccines  Completed   HPV Vaccine  Aged Out  *Topic was postponed. The date shown is not the original due date.   Advanced directives: not yet completed  Conditions/risks identified: none new  Next appointment: Follow up in one year for your annual wellness visit    Preventive Care 65 Years and Older, Female Preventive care refers to lifestyle choices and visits with your health care provider that can promote health and wellness. What does preventive care include? A yearly physical exam. This is also called an annual well check. Dental exams once or twice a year. Routine eye exams. Ask your health care provider how often you should have your eyes checked. Personal lifestyle choices, including: Daily care of your teeth  and gums. Regular physical activity. Eating a healthy diet. Avoiding tobacco and drug use. Limiting alcohol use. Practicing safe sex. Taking low-dose aspirin every day. Taking vitamin and mineral supplements as recommended by your health care provider. What happens during an annual well check? The services and screenings done by your health care provider during your annual well check will depend on your age, overall health, lifestyle risk factors, and family history of disease. Counseling  Your health care provider may ask you questions about your: Alcohol use. Tobacco use. Drug use. Emotional well-being. Home and relationship well-being. Sexual activity. Eating habits. History of falls. Memory and ability to understand (cognition). Work and work Statistician. Reproductive health. Screening  You may have the following tests or measurements: Height, weight, and BMI. Blood pressure. Lipid and cholesterol levels. These may be checked every 5 years, or more frequently if you are over 49 years old. Skin check. Lung cancer screening. You may have this screening every year starting at age 49 if you have a 30-pack-year history of smoking and currently smoke or have quit within the past 15 years. Fecal occult blood test (FOBT) of the stool. You may have this test every year starting at age 94. Flexible sigmoidoscopy or colonoscopy. You may have a sigmoidoscopy every 5 years or a colonoscopy every 10 years starting at age 102. Hepatitis  C blood test. Hepatitis B blood test. Sexually transmitted disease (STD) testing. Diabetes screening. This is done by checking your blood sugar (glucose) after you have not eaten for a while (fasting). You may have this done every 1-3 years. Bone density scan. This is done to screen for osteoporosis. You may have this done starting at age 58. Mammogram. This may be done every 1-2 years. Talk to your health care provider about how often you should have regular  mammograms. Talk with your health care provider about your test results, treatment options, and if necessary, the need for more tests. Vaccines  Your health care provider may recommend certain vaccines, such as: Influenza vaccine. This is recommended every year. Tetanus, diphtheria, and acellular pertussis (Tdap, Td) vaccine. You may need a Td booster every 10 years. Zoster vaccine. You may need this after age 49. Pneumococcal 13-valent conjugate (PCV13) vaccine. One dose is recommended after age 76. Pneumococcal polysaccharide (PPSV23) vaccine. One dose is recommended after age 37. Talk to your health care provider about which screenings and vaccines you need and how often you need them. This information is not intended to replace advice given to you by your health care provider. Make sure you discuss any questions you have with your health care provider. Document Released: 10/02/2015 Document Revised: 05/25/2016 Document Reviewed: 07/07/2015 Elsevier Interactive Patient Education  2017 [place] Prevention in the Home Falls can cause injuries. They can happen to people of all ages. There are many things you can do to make your home safe and to help prevent falls. What can I do on the outside of my home? Regularly fix the edges of walkways and driveways and fix any cracks. Remove anything that might make you trip as you walk through a door, such as a raised step or threshold. Trim any bushes or trees on the path to your home. Use bright outdoor lighting. Clear any walking paths of anything that might make someone trip, such as rocks or tools. Regularly check to see if handrails are loose or broken. Make sure that both sides of any steps have handrails. Any raised decks and porches should have guardrails on the edges. Have any leaves, snow, or ice cleared regularly. Use sand or salt on walking paths during winter. Clean up any spills in your garage right away. This includes oil  or grease spills. What can I do in the bathroom? Use night lights. Install grab bars by the toilet and in the tub and shower. Do not use towel bars as grab bars. Use non-skid mats or decals in the tub or shower. If you need to sit down in the shower, use a plastic, non-slip stool. Keep the floor dry. Clean up any water that spills on the floor as soon as it happens. Remove soap buildup in the tub or shower regularly. Attach bath mats securely with double-sided non-slip rug tape. Do not have throw rugs and other things on the floor that can make you trip. What can I do in the bedroom? Use night lights. Make sure that you have a light by your bed that is easy to reach. Do not use any sheets or blankets that are too big for your bed. They should not hang down onto the floor. Have a firm chair that has side arms. You can use this for support while you get dressed. Do not have throw rugs and other things on the floor that can make you trip. What can I do in the  kitchen? Clean up any spills right away. Avoid walking on wet floors. Keep items that you use a lot in easy-to-reach places. If you need to reach something above you, use a strong step stool that has a grab bar. Keep electrical cords out of the way. Do not use floor polish or wax that makes floors slippery. If you must use wax, use non-skid floor wax. Do not have throw rugs and other things on the floor that can make you trip. What can I do with my stairs? Do not leave any items on the stairs. Make sure that there are handrails on both sides of the stairs and use them. Fix handrails that are broken or loose. Make sure that handrails are as long as the stairways. Check any carpeting to make sure that it is firmly attached to the stairs. Fix any carpet that is loose or worn. Avoid having throw rugs at the top or bottom of the stairs. If you do have throw rugs, attach them to the floor with carpet tape. Make sure that you have a light  switch at the top of the stairs and the bottom of the stairs. If you do not have them, ask someone to add them for you. What else can I do to help prevent falls? Wear shoes that: Do not have high heels. Have rubber bottoms. Are comfortable and fit you well. Are closed at the toe. Do not wear sandals. If you use a stepladder: Make sure that it is fully opened. Do not climb a closed stepladder. Make sure that both sides of the stepladder are locked into place. Ask someone to hold it for you, if possible. Clearly mark and make sure that you can see: Any grab bars or handrails. First and last steps. Where the edge of each step is. Use tools that help you move around (mobility aids) if they are needed. These include: Canes. Walkers. Scooters. Crutches. Turn on the lights when you go into a dark area. Replace any light bulbs as soon as they burn out. Set up your furniture so you have a clear path. Avoid moving your furniture around. If any of your floors are uneven, fix them. If there are any pets around you, be aware of where they are. Review your medicines with your doctor. Some medicines can make you feel dizzy. This can increase your chance of falling. Ask your doctor what other things that you can do to help prevent falls. This information is not intended to replace advice given to you by your health care provider. Make sure you discuss any questions you have with your health care provider. Document Released: 07/02/2009 Document Revised: 02/11/2016 Document Reviewed: 10/10/2014 Elsevier Interactive Patient Education  2017 [employer].

## 2021-04-20 NOTE — Progress Notes (Signed)
Subjective:   Cynthia [name] is a 75 y.o. female who presents for Medicare Annual (Subsequent) preventive examination.  Review of Systems    No ROS.  Medicare Wellness   Cardiac Risk Factors include: advanced age (>21men, >12 women);diabetes mellitus;hypertension     Objective:    Today's Vitals   04/20/21 1011  BP: 109/71  Pulse: 70  Resp: 15  Temp: (!) 97.3 F (36.3 C)  SpO2: 99%  Weight: 230 lb 12.8 oz (104.7 kg)  Height: 5\' 9"  (1.753 m)   Body mass index is 34.08 kg/m.  Advanced Directives 04/20/2021 02/08/2021 10/23/2020 06/11/2020 04/29/2020 04/29/2020 04/27/2020  Does Patient Have a Medical Advance Directive? No No No No No No No  Would patient like information on creating a medical advance directive? No - Patient declined - No - Patient declined No - Patient declined No - Patient declined No - Patient declined No - Patient declined    Current Medications (verified) Outpatient Encounter Medications as of 04/20/2021  Medication Sig   albuterol (VENTOLIN HFA) 108 (90 Base) MCG/ACT inhaler Inhale 2 puffs into the lungs every 6 (six) hours as needed for wheezing or shortness of breath.   amLODipine (NORVASC) 10 MG tablet TAKE 1 TABLET (10 MG TOTAL) BY MOUTH DAILY.   aspirin EC 81 MG tablet Take 81 mg by mouth daily. Swallow whole.   atorvastatin (LIPITOR) 20 MG tablet TAKE 1 TABLET BY MOUTH EVERY DAY   Calcium Carbonate-Vit D-Min (CALCIUM 600+D PLUS MINERALS) 600-400 MG-UNIT TABS Take 1 tablet by mouth daily.    diclofenac sodium (VOLTAREN) 1 % GEL Apply 1 application topically 4 (four) times daily as needed (knee pain.).    DULoxetine (CYMBALTA) 60 MG capsule Take 1 capsule (60 mg total) by mouth daily.   Lancets (ONETOUCH DELICA PLUS OACZYS06T) MISC USE AS INSTRUCTED   levothyroxine (SYNTHROID) 137 MCG tablet Take 1 tablet (137 mcg total) by mouth daily before breakfast.   losartan-hydrochlorothiazide (HYZAAR) 50-12.5 MG tablet Take 1 tablet by mouth daily.   meloxicam  (MOBIC) 7.5 MG tablet TAKE 1 TABLET (7.5 MG TOTAL) BY MOUTH DAILY AS NEEDED FOR PAIN. TAKE WITH FOOD.   metFORMIN (GLUCOPHAGE) 500 MG tablet TAKE 1 TABLET BY MOUTH TWICE A DAY   metoprolol tartrate (LOPRESSOR) 25 MG tablet Take 1 tablet (25 mg total) by mouth 2 (two) times daily.   ONETOUCH ULTRA test strip TEST BLOOD SUGAR 1-2 TIMES A DAY   No facility-administered encounter medications on file as of 04/20/2021.    Allergies (verified) Patient has no known allergies.   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 denies   Diabetes mellitus without complication ([place])    type 2   GERD (gastroesophageal reflux disease)    Hyperlipidemia    Hypertension    Hypothyroidism    Lung nodule    Thyroid disease    Past Surgical History:  Procedure Laterality Date   BREAST BIOPSY Left 11/25/2015   stereo  neg   BREAST EXCISIONAL BIOPSY Left yrs ago   benign   COLONOSCOPY WITH PROPOFOL N/A 04/20/2017   Procedure: COLONOSCOPY WITH PROPOFOL;  Surgeon: [name], MD;  Location: [facility] ENDOSCOPY;  Service: Endoscopy;  Laterality: N/A;   COLONOSCOPY WITH PROPOFOL N/A 01/08/2018   Procedure: COLONOSCOPY WITH PROPOFOL;  Surgeon: [name], MD;  Location: [facility] ENDOSCOPY;  Service: Gastroenterology;  Laterality: N/A;   COLONOSCOPY WITH PROPOFOL N/A 08/10/2018   Procedure: COLONOSCOPY WITH PROPOFOL;  Surgeon: [name], MD;  Location: [facility] ENDOSCOPY;  Service: Gastroenterology;  Laterality: N/A;   COLONOSCOPY WITH PROPOFOL N/A 02/08/2021   Procedure: COLONOSCOPY WITH PROPOFOL;  Surgeon: [name], MD;  Location[facility] ENDOSCOPY;  Service: Gastroenterology;  Laterality: N/A;   IR RADIOLOGIST EVAL & MGMT  04/08/2020   IR RADIOLOGIST EVAL & MGMT  05/26/2020   IR RADIOLOGIST EVAL & MGMT  09/29/2020   IR RADIOLOGIST EVAL & MGMT  03/10/2021    RADIOLOGY WITH ANESTHESIA N/A 04/29/2020   Procedure: CRYOABLATION;  Surgeon: Arne Cleveland, MD;  Location: [facility] ORS;  Service: Radiology;  Laterality: N/A;   THYROIDECTOMY     Dr. [name]   VAGINAL DELIVERY     4   Family History  Problem Relation Age of Onset   Hypertension Mother    Hypertension Father    Diabetes Sister    Thyroid disease Sister    Breast cancer Maternal Aunt    Breast cancer Maternal Aunt    Social History   Socioeconomic History   Marital status: Married    Spouse name: Not on file   Number of children: 4   Years of education: Not on file   Highest education level: Not on file  Occupational History   Not on file  Tobacco Use   Smoking status: Former    Packs/day: 0.75    Years: 48.00    Pack years: 36.00    Types: Cigarettes    Quit date: 2015    Years since quitting: 7.5   Smokeless tobacco: Former    Types: Snuff, Database administrator Use   Vaping Use: Never used  Substance and Sexual Activity   Alcohol use: No   Drug use: No   Sexual activity: Not Currently  Other Topics Concern   Not on file  Social History Narrative   Lives in [place] with husband. Has 4 children.      4 grandchildren.      Work - Retired from [employer]- walking the track, 4x per week      Diet- regular      Social Determinants of Radio broadcast assistant Strain: Not on [employer] Insecurity: Not on file  Transportation Needs: Not on file  Physical Activity: Not on file  Stress: Not on file  Social Connections: Not on file    Tobacco Counseling Counseling given: Not Answered   Clinical Intake:  Pre-visit preparation completed: Yes        Diabetes: Yes (Followed by pcp)  How often do you need to have someone help you when you read instructions, pamphlets, or other written materials from your doctor or pharmacy?: 1 - Never  Nutrition Risk Assessment: Has the patient had any N/V/D within the last 2 months?  No  Does the patient  have any non-healing wounds?  No  Has the patient had any unintentional weight loss or weight gain?  No   Diabetes: Did the patient bring in their glucometer from home?  No  How often do you monitor your CBG's? daily. Today reading 124.  Financial Strains and Diabetes Management: Are you having any financial strains with the device, your supplies or your medication? No .  Does the patient want to be seen by Chronic Care Management for management of their  diabetes?  No  Would the patient like to be referred to a Nutritionist or for Diabetic Management?  No    Interpreter Needed?: No      Activities of Daily Living In your present state of health, do you have any difficulty performing the following activities: 04/20/2021 04/29/2020  Hearing? N N  Vision? N N  Difficulty concentrating or making decisions? N N  Walking or climbing stairs? N N  Dressing or bathing? N N  Doing errands, shopping? N N  Preparing Food and eating ? N -  Using the Toilet? N -  In the past six months, have you accidently leaked urine? N -  Do you have problems with loss of bowel control? N -  Managing your Medications? N -  Managing your Finances? N -  Housekeeping or managing your Housekeeping? N -  Some recent data might be hidden    Patient Care Team: Burnard Hawthorne, FNP as PCP - General (Family Medicine) Jackolyn Confer, MD (Internal Medicine) Clent Jacks, RN as Registered Nurse Earlie Server, MD as Consulting Physician (Hematology and Oncology)  Indicate any recent Medical Services you may have received from other than [facility] providers in the past year (date may be approximate).     Assessment:   This is a routine wellness examination for [place].  Hearing/Vision screen Hearing Screening - Comments:: Patient is able to hear conversational tones without difficulty.  No issues reported. Vision Screening - Comments:: Followed by [facility]  Wears corrective lenses  Diabetic exam;  no retinopathy reported  Annual visits  They have regular follow up with the ophthalmologist  Dietary issues and exercise activities discussed: Current Exercise Habits: Home exercise routine, Intensity: Mild She tries to have a healthy diet Good water intake   Goals Addressed             This Visit's Progress    Maintain weight       STAY HYDRATED AND DRINK PLENTY OF FLUIDS.  INCREASE WATER INTAKE. LOW CARB FOODS.  LEAN MEATS (CHICKEN, [place], FISH).  VEGETABLES, FRUITS. STRETCH AND STAY ACTIVE BY CONTINUING TO WALK WITH SISTER WHEN THE WEATHER PERMITS.  3 DAYS A WEEK FOR 30 MINUTES.          Depression Screen PHQ 2/9 Scores 04/20/2021 06/16/2020 04/17/2020 12/06/2019 07/05/2019 05/24/2019 04/17/2019  PHQ - 2 Score 0 0 0 0 0 0 0  PHQ- 9 Score - 0 - 0 - 0 -    Fall Risk Fall Risk  04/20/2021 10/19/2020 04/17/2020 03/13/2020 07/05/2019  Falls in the past year? 0 0 - 1 1  Number falls in past yr: - 0 - 0 0  Injury with Fall? 0 0 - 0 0  Comment - - - - -  Follow up Falls evaluation completed Falls evaluation completed Falls evaluation completed Falls evaluation completed Falls evaluation completed    [place]: Adequate lighting in your home to reduce risk of falls? Yes   ASSISTIVE DEVICES UTILIZED TO PREVENT FALLS: Life alert? No  Use of a cane, [place] or w/c? No   TIMED UP AND GO: Was the test performed? No .  Length of time to ambulate 10 feet: 10 sec.   Gait steady and fast without use of assistive device  Cognitive Function: MMSE - Mini Mental State Exam 04/07/2017 04/07/2016 04/02/2015  Orientation to time 5 5 5   Orientation to Place 5 5 5   Registration 3 3 3  Attention/ Calculation 2 5 5   Attention/Calculation-comments Difficulty performing simple calculation - -  Recall 3 3 3   Language- name 2 objects 2 2 2   Language- repeat 1 1 1   Language- follow 3 step command 3 3 3   Language- read & follow direction 1 1 1   Write a sentence 1 1  1   Copy design 1 1 1   Total score 27 30 30      6CIT Screen 04/20/2021 04/17/2020 04/17/2019 04/09/2018  What Year? 0 points 0 points 0 points 0 points  What month? 0 points 0 points 0 points 0 points  What time? 0 points 0 points 0 points 0 points  Count back from 20 0 points - 0 points 0 points  Months in reverse 2 points - 0 points 2 points  Repeat phrase 0 points - 0 points -  Total Score 2 - 0 -    Immunizations Immunization History  Administered Date(s) Administered   Fluad Quad(high Dose 65+) 05/24/2019, 06/16/2020   Influenza, High Dose Seasonal PF 07/18/2017, 07/11/2018   Influenza,inj,Quad PF,6+ Mos 06/13/2013, 06/13/2014, 07/03/2015, 05/11/2016   Influenza-Unspecified 07/18/2012   PFIZER(Purple Top)SARS-COV-2 Vaccination 12/20/2019, 01/14/2020   Pneumococcal Conjugate-13 03/07/2014   Pneumococcal Polysaccharide-23 03/08/2013   Tdap 05/17/2016   Health Maintenance Health Maintenance  Topic Date Due   OPHTHALMOLOGY EXAM  09/02/2020   HEMOGLOBIN A1C  04/18/2021   INFLUENZA VACCINE  04/19/2021   Zoster Vaccines- Shingrix (1 of 2) 07/21/2021 (Originally 10/07/1964)   COVID-19 Vaccine (3 - Pfizer risk series) 10/20/2021 (Originally 02/11/2020)   FOOT EXAM  06/16/2021   COLONOSCOPY (Pts 45-41yrs Insurance coverage will need to be confirmed)  02/09/2024   TETANUS/TDAP  05/17/2026   DEXA SCAN  Completed   Hepatitis C Screening  Completed   PNA vac Low Risk Adult  Completed   HPV VACCINES  Aged Out   Lung Cancer Screening: 11/11/20.  Vision Screening: Recommended annual ophthalmology exams for early detection of glaucoma and other disorders of the eye. Eye exam- plans to schedule.   Dental Screening: Recommended annual dental exams for proper oral hygiene  Community Resource Referral / Chronic Care Management: CRR required this visit?  No   CCM required this visit?  No      Plan:     I have personally reviewed and noted the following in the patient's chart:    Medical and social history Use of alcohol, tobacco or illicit drugs  Current medications and supplements including opioid prescriptions.  Functional ability and status Nutritional status Physical activity Advanced directives List of other physicians Hospitalizations, surgeries, and ER visits in previous 12 months Vitals Screenings to include cognitive, depression, and falls Referrals and appointments  In addition, I have reviewed and discussed with patient certain preventive protocols, quality metrics, and best practice recommendations. A written personalized care plan for preventive services as well as general preventive health recommendations were provided to patient.     Varney Biles, LPN   [id number]

## 2021-04-22 ENCOUNTER — Other Ambulatory Visit: Payer: Self-pay

## 2021-04-22 ENCOUNTER — Ambulatory Visit
Admission: RE | Admit: 2021-04-22 | Discharge: 2021-04-22 | Disposition: A | Discharge status: Home / Self Care | Payer: Medicare Other | Source: Ambulatory Visit | Attending: Family | Admitting: Family

## 2021-04-22 ENCOUNTER — Inpatient Hospital Stay: Payer: Medicare Other | Admitting: Oncology

## 2021-04-22 DIAGNOSIS — Z1231 Encounter for screening mammogram for malignant neoplasm of breast: Secondary | ICD-10-CM | POA: Diagnosis not present

## 2021-04-22 IMAGING — MG MM DIGITAL SCREENING BILAT W/ TOMO AND CAD
6 of 10 series · 6 of 30 positions shown · non-contrast
Comparison: Previous exam(s).

CLINICAL DATA: Screening.

EXAM:
DIGITAL SCREENING BILATERAL MAMMOGRAM WITH TOMOSYNTHESIS AND CAD
TECHNIQUE: Bilateral screening digital craniocaudal and mediolateral oblique
mammograms were obtained. Bilateral screening digital breast
tomosynthesis was performed. The images were evaluated with
computer-aided detection.

[R MLO synth-2D (1 of 2)]
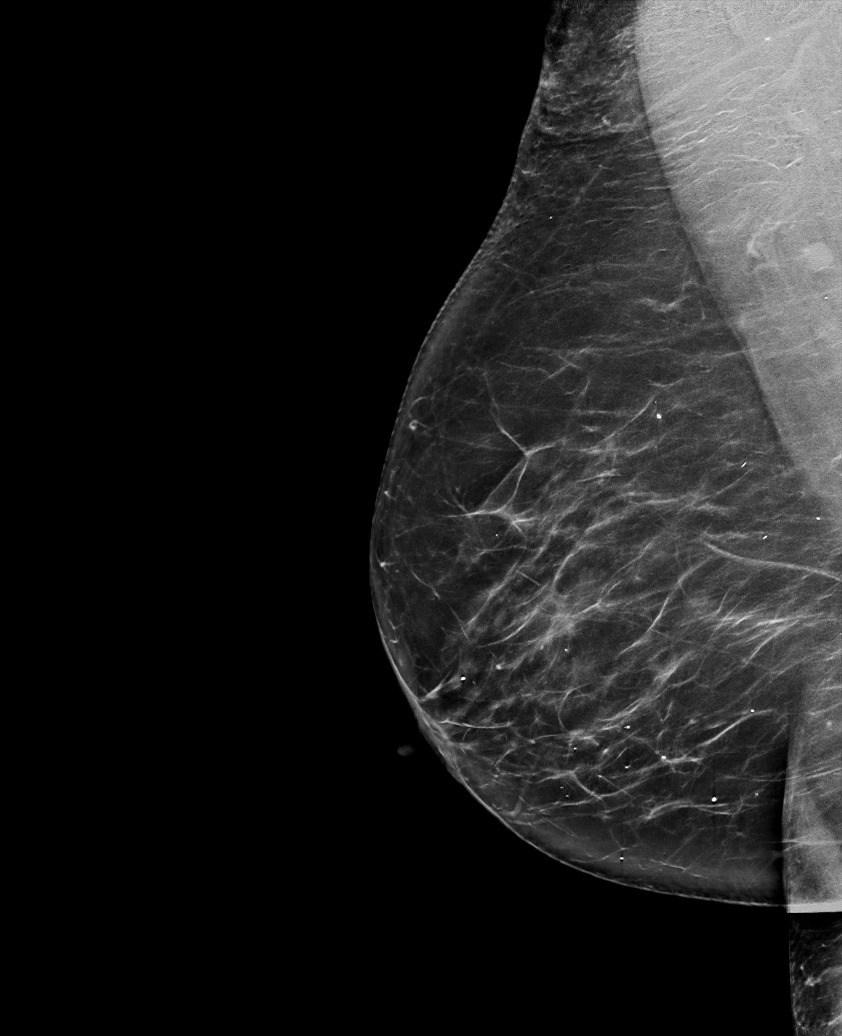

[L MLO synth-2D]
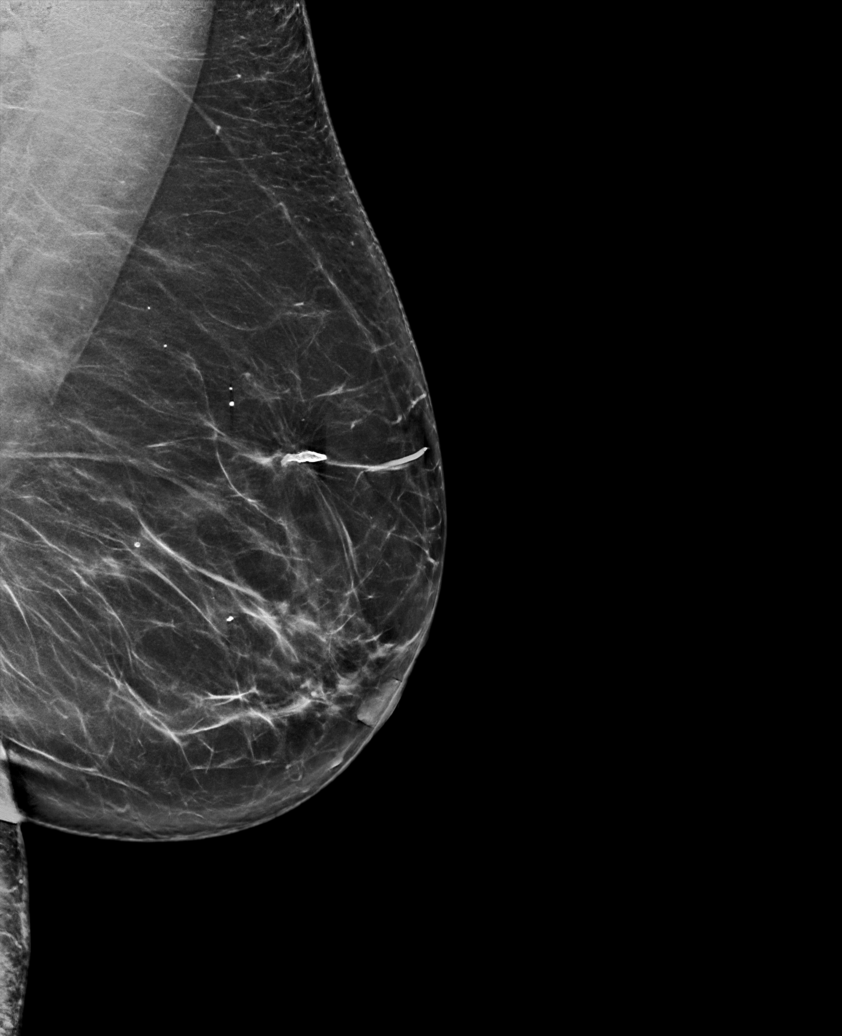

[R MLO synth-2D (2 of 2)]
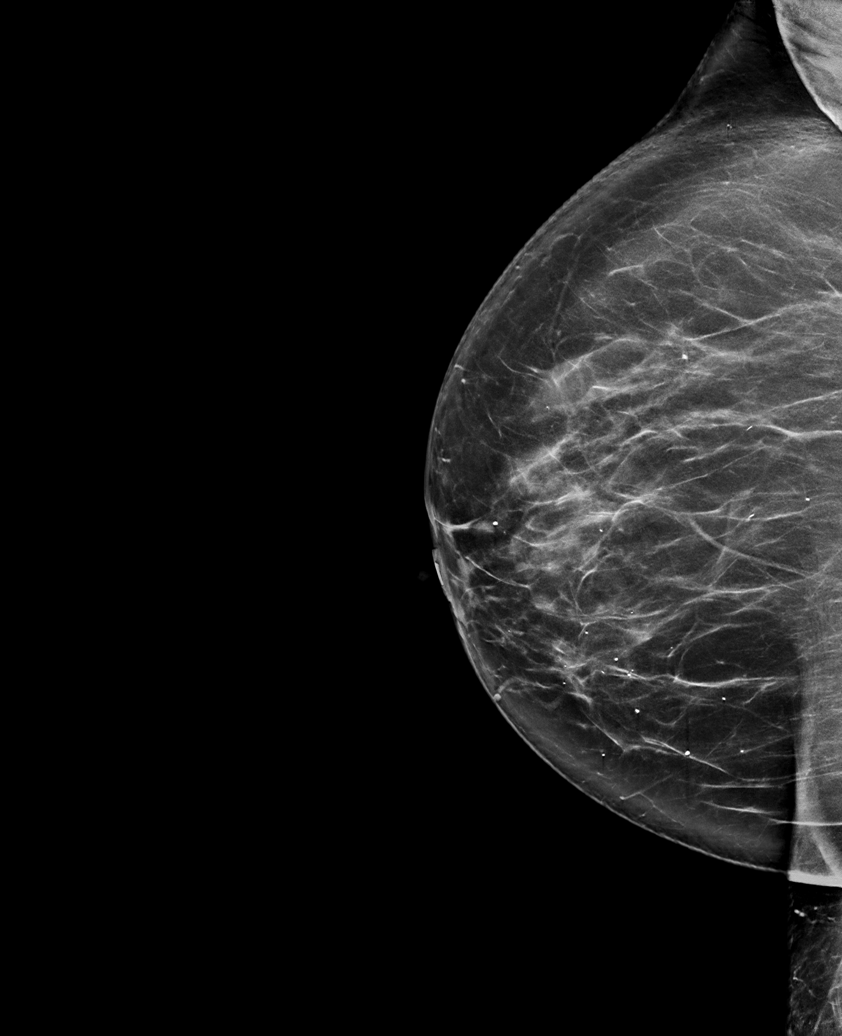

[R CC synth-2D]
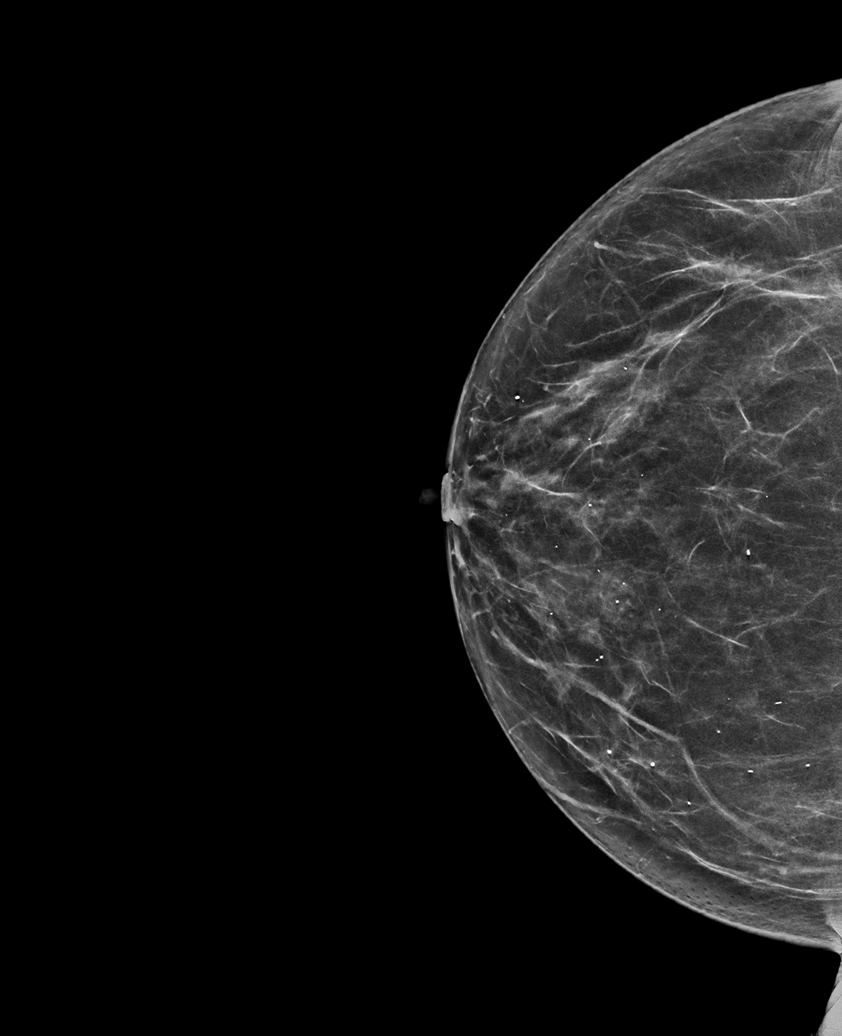

[L CC synth-2D]
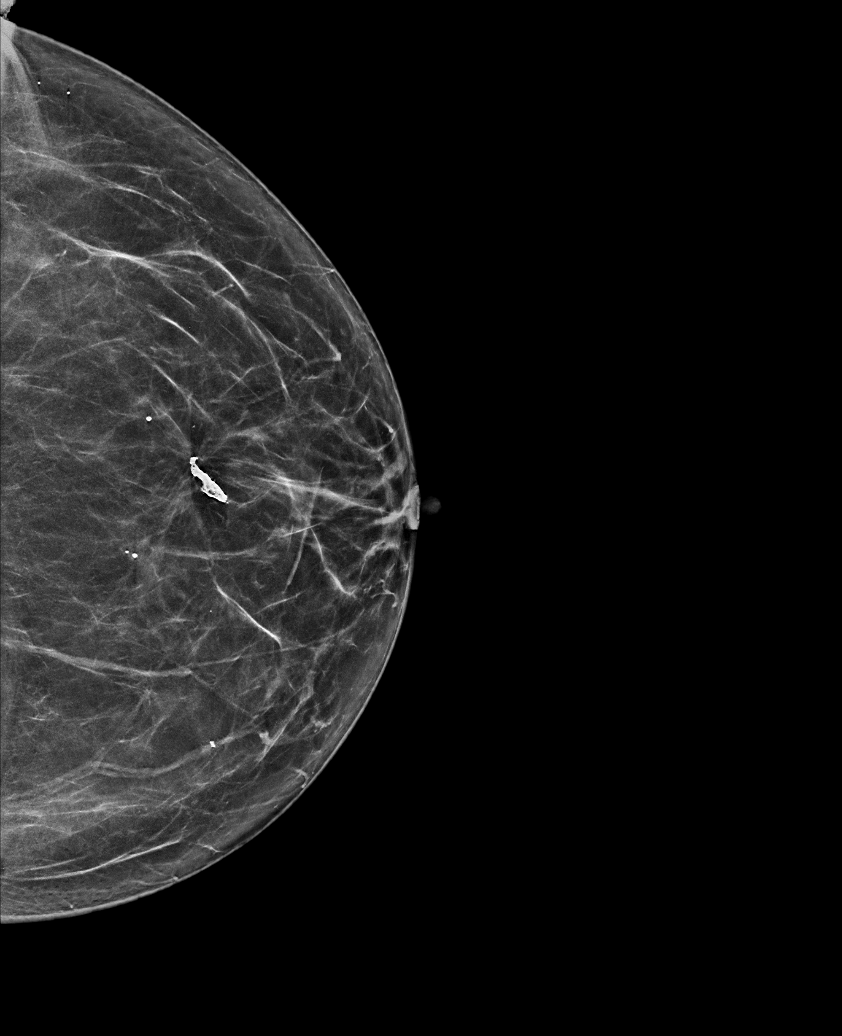

[R MLO tomo · tomo slice 43/84.0]
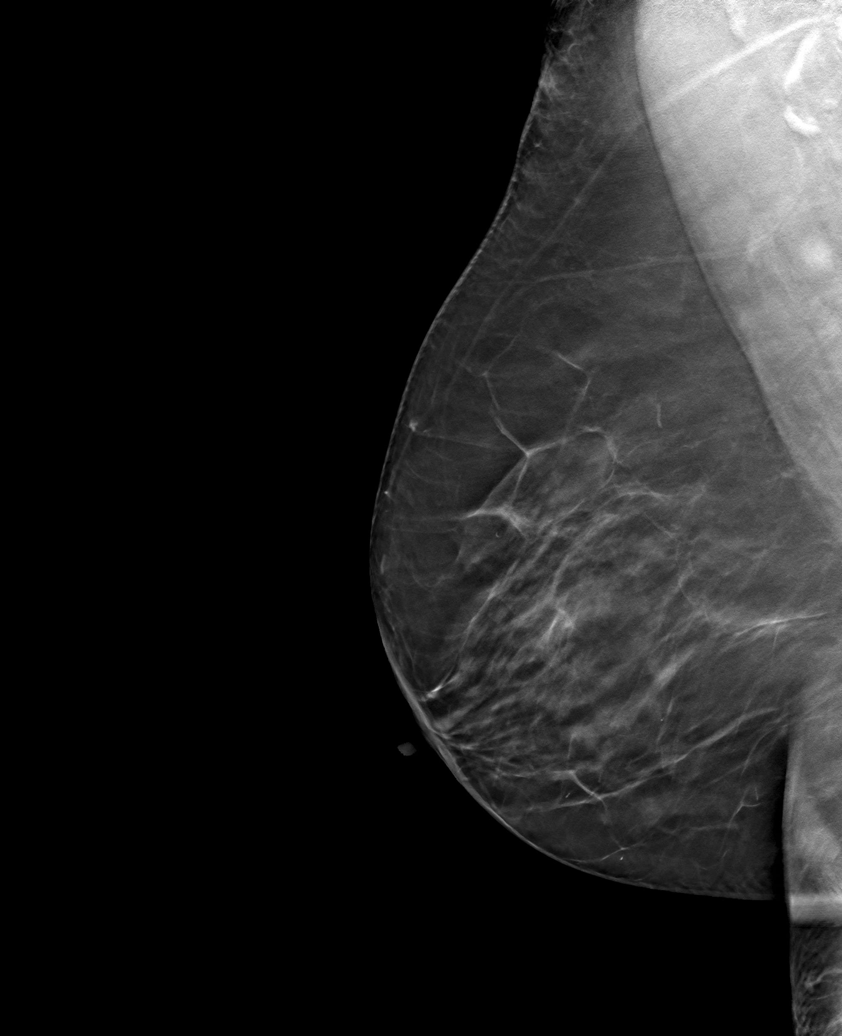

[6 of 30 positions shown; findings below may reference images not displayed]

ACR Breast Density Category b: There are scattered areas of
fibroglandular density.
FINDINGS: There are no findings suspicious for malignancy.
IMPRESSION: No mammographic evidence of malignancy. A result letter of this
screening mammogram will be mailed directly to the patient.

RECOMMENDATION:
Screening mammogram in one year. (Code:[BY])

BI-RADS CATEGORY  1: Negative.

## 2021-04-27 ENCOUNTER — Encounter: Payer: Self-pay | Admitting: Oncology

## 2021-04-27 ENCOUNTER — Inpatient Hospital Stay: Payer: Medicare Other | Attending: Oncology | Admitting: Oncology

## 2021-04-27 ENCOUNTER — Other Ambulatory Visit: Payer: Self-pay

## 2021-04-27 VITALS — BP 124/79 | HR 64 | Temp 97.8°F | Resp 18 | Wt 232.8 lb

## 2021-04-27 DIAGNOSIS — Z8349 Family history of other endocrine, nutritional and metabolic diseases: Secondary | ICD-10-CM | POA: Diagnosis not present

## 2021-04-27 DIAGNOSIS — Z803 Family history of malignant neoplasm of breast: Secondary | ICD-10-CM | POA: Diagnosis not present

## 2021-04-27 DIAGNOSIS — Z79899 Other long term (current) drug therapy: Secondary | ICD-10-CM | POA: Diagnosis not present

## 2021-04-27 DIAGNOSIS — K760 Fatty (change of) liver, not elsewhere classified: Secondary | ICD-10-CM | POA: Insufficient documentation

## 2021-04-27 DIAGNOSIS — I2510 Atherosclerotic heart disease of native coronary artery without angina pectoris: Secondary | ICD-10-CM | POA: Insufficient documentation

## 2021-04-27 DIAGNOSIS — N289 Disorder of kidney and ureter, unspecified: Secondary | ICD-10-CM | POA: Diagnosis not present

## 2021-04-27 DIAGNOSIS — Z809 Family history of malignant neoplasm, unspecified: Secondary | ICD-10-CM | POA: Diagnosis not present

## 2021-04-27 DIAGNOSIS — Z833 Family history of diabetes mellitus: Secondary | ICD-10-CM | POA: Diagnosis not present

## 2021-04-27 DIAGNOSIS — E119 Type 2 diabetes mellitus without complications: Secondary | ICD-10-CM | POA: Diagnosis not present

## 2021-04-27 DIAGNOSIS — N2889 Other specified disorders of kidney and ureter: Secondary | ICD-10-CM | POA: Diagnosis not present

## 2021-04-27 DIAGNOSIS — E785 Hyperlipidemia, unspecified: Secondary | ICD-10-CM | POA: Diagnosis not present

## 2021-04-27 DIAGNOSIS — Z87891 Personal history of nicotine dependence: Secondary | ICD-10-CM | POA: Insufficient documentation

## 2021-04-27 DIAGNOSIS — Z8249 Family history of ischemic heart disease and other diseases of the circulatory system: Secondary | ICD-10-CM | POA: Diagnosis not present

## 2021-04-27 DIAGNOSIS — C7A8 Other malignant neuroendocrine tumors: Secondary | ICD-10-CM | POA: Insufficient documentation

## 2021-04-27 DIAGNOSIS — I10 Essential (primary) hypertension: Secondary | ICD-10-CM | POA: Insufficient documentation

## 2021-04-27 DIAGNOSIS — E039 Hypothyroidism, unspecified: Secondary | ICD-10-CM | POA: Insufficient documentation

## 2021-04-27 NOTE — Progress Notes (Signed)
Patient here for follow up. No new concerns voiced.  °

## 2021-04-27 NOTE — Progress Notes (Signed)
Hematology/Oncology follow up note [facility] Telephone:([phone] Fax:([phone]) [phone]   Patient Care Team: Burnard Hawthorne, FNP as PCP - General (Family Medicine) Jackolyn Confer, MD (Internal Medicine) Clent Jacks, RN as Registered Nurse Earlie Server, MD as Consulting Physician (Hematology and Oncology)  REFERRING PROVIDER: Gastroenterology: Napoleon Form. 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 All three ascending colon polyps were tubular adenoma. 5 sigmoid colon polyps were hyperplastic polyps. One polyp turned out to be well defferentiated neuroendocrine tumor, 2.30mm, grade 1.   The tumor was small and the tattoo of the site of tumor removal is not feasible per GI.  Case was discussed on tumor board and recommend surveillance.  02/09/2018 dotatate PET scan was negative Patient had colonoscopy surveillance   # S/p repeat colonoscopy on 08/10/2018. Polyps resected. Pathology negative malignancy.  # She recently had a CT chest lung cancer screening.  With incidental findings of low attenuation lesion of the left kidney.  03/02/2020 ultrasound kidney showed 2.7 cm solid-appearing lesion of the left kidney which is suspicious for renal cell carcinoma. 03/09/2020 MRI abdomen with and without contrast showed complex cystic lesion arising from the upper pole of the left kidney measuring 2.1 cm.  Compatible with cystic renal cell carcinoma.  She also has right kidney lesion. Mild hepatic steatosis  # 04/29/2020 s/p image guided left renal mass (T1 lesion) cryoablation by Dr.Hassell.  #  09/25/20 MRI abdomen with and without contrast showed post ablation changes in the left kidney without evidence of residual at the site of ablation.  Nodular area seen in the right lung base is more focal seen the absence of significant atelectasis measuring approximately 33mm .   INTERVAL HISTORY Cynthia [name] is a 75 y.o. 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 She reports feeling well today..  No new complaints.  Denies any nausea vomiting diarrhea.  No unintentional weight loss.  Review of Systems  Constitutional:  Negative for chills, fever, malaise/fatigue and weight loss.  HENT:  Negative for sore throat.   Eyes:  Negative for redness.  Respiratory:  Negative for cough, shortness of breath and wheezing.   Cardiovascular:  Negative for chest pain, palpitations and leg swelling.  Gastrointestinal:  Negative for abdominal pain, blood in stool, nausea and vomiting.  Genitourinary:  Negative for dysuria.  Musculoskeletal:  Negative for joint pain and myalgias.  Skin:  Negative for rash.  Neurological:  Negative for dizziness, tingling and tremors.  Endo/Heme/Allergies:  Does not bruise/bleed easily.  Psychiatric/Behavioral:  Negative for hallucinations.    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 denies   Diabetes mellitus without complication (HCC)    type 2   GERD (gastroesophageal reflux disease)    Hyperlipidemia    Hypertension    Hypothyroidism    Lung nodule  Thyroid disease     SURGICAL HISTORY: Past Surgical History:  Procedure Laterality Date   BREAST BIOPSY Left 11/25/2015   stereo  neg   BREAST EXCISIONAL BIOPSY Left yrs ago   benign   COLONOSCOPY WITH PROPOFOL N/A 04/20/2017   Procedure:  COLONOSCOPY WITH PROPOFOL;  Surgeon: [name], MD;  Location: [facility] ENDOSCOPY;  Service: Endoscopy;  Laterality: N/A;   COLONOSCOPY WITH PROPOFOL N/A 01/08/2018   Procedure: COLONOSCOPY WITH PROPOFOL;  Surgeon: [name], MD;  Location: [facility] ENDOSCOPY;  Service: Gastroenterology;  Laterality: N/A;   COLONOSCOPY WITH PROPOFOL N/A 08/10/2018   Procedure: COLONOSCOPY WITH PROPOFOL;  Surgeon: [name], MD;  Location: [facility] ENDOSCOPY;  Service: Gastroenterology;  Laterality: N/A;   COLONOSCOPY WITH PROPOFOL N/A 02/08/2021   Procedure: COLONOSCOPY WITH PROPOFOL;  Surgeon: [name], MD;  Location: [facility] ENDOSCOPY;  Service: Gastroenterology;  Laterality: N/A;   IR RADIOLOGIST EVAL & MGMT  04/08/2020   IR RADIOLOGIST EVAL & MGMT  05/26/2020   IR RADIOLOGIST EVAL & MGMT  09/29/2020   IR RADIOLOGIST EVAL & MGMT  03/10/2021   RADIOLOGY WITH ANESTHESIA N/A 04/29/2020   Procedure: CRYOABLATION;  Surgeon: Arne Cleveland, MD;  Location: [facility] ORS;  Service: Radiology;  Laterality: N/A;   THYROIDECTOMY     Dr. [name]   VAGINAL DELIVERY     4    SOCIAL HISTORY: Social History   Socioeconomic History   Marital status: Married    Spouse name: Not on file   Number of children: 4   Years of education: Not on file   Highest education level: Not on file  Occupational History   Not on file  Tobacco Use   Smoking status: Former    Packs/day: 0.75    Years: 48.00    Pack years: 36.00    Types: Cigarettes    Quit date: 2015    Years since quitting: 7.6   Smokeless tobacco: Former    Types: Snuff, Chew  Vaping Use   Vaping Use: Never used  Substance and Sexual Activity   Alcohol use: No   Drug use: No   Sexual activity: Not Currently  Other Topics Concern   Not on file  Social History Narrative   Lives in [place] with husband. Has 4 children.      4 grandchildren.      Work - Retired from [employer]- walking the track, 4x per week      Diet- regular      Social  Determinants of Radio broadcast assistant Strain: Not on [employer] Insecurity: Not on file  Transportation Needs: Not on file  Physical Activity: Not on file  Stress: Not on file  Social Connections: Not on file  Intimate Partner Violence: Not on file    FAMILY HISTORY: Family History  Problem Relation Age of Onset   Hypertension Mother    Hypertension Father    Diabetes Sister    Thyroid disease Sister    Breast cancer Maternal Aunt    Breast cancer Maternal Aunt     ALLERGIES:  has No Known Allergies.  MEDICATIONS:  Current Outpatient Medications  Medication Sig Dispense Refill   amLODipine (NORVASC) 10 MG tablet TAKE 1 TABLET (10 MG TOTAL) BY MOUTH DAILY. 90 tablet 3   aspirin EC 81 MG tablet Take 81 mg by mouth daily. Swallow whole.     atorvastatin (LIPITOR) 20 MG tablet TAKE 1 TABLET BY MOUTH EVERY DAY 90  tablet 0   Calcium Carbonate-Vit D-Min (CALCIUM 600+D PLUS MINERALS) 600-400 MG-UNIT TABS Take 1 tablet by mouth daily.      diclofenac sodium (VOLTAREN) 1 % GEL Apply 1 application topically 4 (four) times daily as needed (knee pain.).      DULoxetine (CYMBALTA) 60 MG capsule Take 1 capsule (60 mg total) by mouth daily. 90 capsule 3   levothyroxine (SYNTHROID) 137 MCG tablet Take 1 tablet (137 mcg total) by mouth daily before breakfast. 90 tablet 1   losartan-hydrochlorothiazide (HYZAAR) 50-12.5 MG tablet Take 1 tablet by mouth daily. 90 tablet 1   meloxicam (MOBIC) 7.5 MG tablet TAKE 1 TABLET (7.5 MG TOTAL) BY MOUTH DAILY AS NEEDED FOR PAIN. TAKE WITH FOOD. 90 tablet 0   metFORMIN (GLUCOPHAGE) 500 MG tablet TAKE 1 TABLET BY MOUTH TWICE A DAY 180 tablet 1   metoprolol tartrate (LOPRESSOR) 25 MG tablet Take 1 tablet (25 mg total) by mouth 2 (two) times daily. 90 tablet 1   ONETOUCH ULTRA test strip TEST BLOOD SUGAR 1-2 TIMES A DAY 100 strip 4   albuterol (VENTOLIN HFA) 108 (90 Base) MCG/ACT inhaler Inhale 2 puffs into the lungs every 6 (six) hours as needed for  wheezing or shortness of breath. 8 g 1   Lancets (ONETOUCH DELICA PLUS [id number]) MISC USE AS INSTRUCTED 100 each 12   No current facility-administered medications for this visit.     PHYSICAL EXAMINATION: ECOG PERFORMANCE STATUS: 0 - Asymptomatic Vitals:   04/27/21 1032  BP: 124/79  Pulse: 64  Resp: 18  Temp: 97.8 F (36.6 C)   Filed Weights   04/27/21 1032  Weight: 232 lb 12.8 oz (105.6 kg)    Physical Exam Constitutional:      General: She is not in acute distress.    Appearance: She is well-developed.  HENT:     Head: Normocephalic and atraumatic.  Eyes:     General: No scleral icterus.    Conjunctiva/sclera: Conjunctivae normal.     Pupils: Pupils are equal, round, and reactive to light.  Cardiovascular:     Rate and Rhythm: Normal rate and regular rhythm.     Heart sounds: Normal heart sounds.  Pulmonary:     Effort: Pulmonary effort is normal. No respiratory distress.     Breath sounds: Normal breath sounds. No wheezing.  Abdominal:     General: Bowel sounds are normal. There is no distension.     Palpations: Abdomen is soft. There is no mass.     Tenderness: There is no abdominal tenderness.  Musculoskeletal:        General: No deformity. Normal range of motion.     Cervical back: Normal range of motion and neck supple.  Lymphadenopathy:     Cervical: No cervical adenopathy.  Skin:    General: Skin is warm and dry.     Findings: No erythema or rash.  Neurological:     Mental Status: She is alert and oriented to person, place, and time. Mental status is at baseline.     Cranial Nerves: No cranial nerve deficit.     Coordination: Coordination normal.  Psychiatric:        Mood and Affect: Mood normal.        Thought Content: Thought content normal.     LABORATORY DATA:  I have reviewed the data as listed Lab Results  Component Value Date   WBC 4.4 04/14/2021   HGB 13.4 04/14/2021   HCT 40.3 04/14/2021   MCV  92.4 04/14/2021   PLT 273 04/14/2021    Recent Labs    04/30/20 0513 06/12/20 1047 10/16/20 1101 12/31/20 0835 04/14/21 1128  NA 138 138 134* 140 133*  K 4.1 4.4 3.6 4.0 3.5  CL 101 100 96* 100 102  CO2 24 27 28 23 26   GLUCOSE 154* 110* 144* 154* 89  BUN 15 10 12 14 14   CREATININE 0.66 0.63 0.60 0.75 0.61  CALCIUM 8.8* 9.7 9.4 9.9 8.9  GFRNONAA >60 89 >60  --  >60  GFRAA >60 102  --   --   --   PROT  --   --  7.3  --  7.1  ALBUMIN  --   --  3.9  --  4.1  AST  --   --  29  --  26  ALT  --   --  27  --  22  ALKPHOS  --   --  62  --  69  BILITOT  --   --  0.7  --  0.5     Serum serotonin level at 111 within normal range 5 HIAA urine test was obtained and patient has not gotten done yet.   ASSESSMENT & PLAN:  1. Neuroendocrine carcinoma of colon ([place])   2. Kidney lesion   3. Family history of cancer   4. 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  5 HIAA is pending.  Labs are reviewed and discussed with patient Recommend patient to continue annual surveillance.  She has had MRI abdomen done in June 2022. 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  S/p cryoablation.  03/08/2021 MRI abdomen with and without contrast showed post ablation changes in the left kidney without evidence of residual or recurrent disease at the site of ablation. .  Former smoker, continue lung cancer screening program.  11/10/2020 CT chest without contrast showed no evidence of pulmonary nodule at the right lung base corresponding to the questioned area on abdominal MRI.  Known pulmonary nodules are no significant changes.  Vague areas of peripheral groundglass opacity in both lungs.  Likely pneumonitis. Patient is asymptomatic.  Continue annual CT lung cancer screening.  Family history of breast cancer.  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   Refer to Engineer, mining.  Follow-up in 6 months   all questions were answered. The patient knows to call the clinic with any problems questions or concerns.    Earlie Server, MD, PhD Hematology Oncology [place] at [facility] 04/27/2021

## 2021-04-29 DIAGNOSIS — E1122 Type 2 diabetes mellitus with diabetic chronic kidney disease: Secondary | ICD-10-CM | POA: Diagnosis not present

## 2021-04-29 DIAGNOSIS — R809 Proteinuria, unspecified: Secondary | ICD-10-CM | POA: Diagnosis not present

## 2021-04-29 DIAGNOSIS — R609 Edema, unspecified: Secondary | ICD-10-CM | POA: Diagnosis not present

## 2021-04-29 DIAGNOSIS — R8290 Unspecified abnormal findings in urine: Secondary | ICD-10-CM | POA: Diagnosis not present

## 2021-04-29 DIAGNOSIS — N1831 Chronic kidney disease, stage 3a: Secondary | ICD-10-CM | POA: Diagnosis not present

## 2021-05-03 DIAGNOSIS — K219 Gastro-esophageal reflux disease without esophagitis: Secondary | ICD-10-CM | POA: Diagnosis not present

## 2021-05-03 DIAGNOSIS — R0609 Other forms of dyspnea: Secondary | ICD-10-CM | POA: Diagnosis not present

## 2021-05-03 DIAGNOSIS — N1831 Chronic kidney disease, stage 3a: Secondary | ICD-10-CM | POA: Diagnosis not present

## 2021-05-03 DIAGNOSIS — E119 Type 2 diabetes mellitus without complications: Secondary | ICD-10-CM | POA: Diagnosis not present

## 2021-05-03 DIAGNOSIS — J449 Chronic obstructive pulmonary disease, unspecified: Secondary | ICD-10-CM | POA: Diagnosis not present

## 2021-05-03 DIAGNOSIS — I10 Essential (primary) hypertension: Secondary | ICD-10-CM | POA: Diagnosis not present

## 2021-05-03 DIAGNOSIS — E782 Mixed hyperlipidemia: Secondary | ICD-10-CM | POA: Diagnosis not present

## 2021-05-03 DIAGNOSIS — I700 Atherosclerosis of aorta: Secondary | ICD-10-CM | POA: Diagnosis not present

## 2021-05-03 DIAGNOSIS — R600 Localized edema: Secondary | ICD-10-CM | POA: Diagnosis not present

## 2021-05-03 DIAGNOSIS — I2510 Atherosclerotic heart disease of native coronary artery without angina pectoris: Secondary | ICD-10-CM | POA: Diagnosis not present

## 2021-05-03 DIAGNOSIS — E079 Disorder of thyroid, unspecified: Secondary | ICD-10-CM | POA: Diagnosis not present

## 2021-05-03 LAB — 5 HIAA, QUANTITATIVE, URINE, 24 HOUR
5-HIAA, Ur: 2.6 mg/L
5-HIAA,Quant.,24 Hr Urine: 7 mg/24 hr (ref 0.0–14.9)
Total Volume: 2700

## 2021-05-05 ENCOUNTER — Ambulatory Visit (INDEPENDENT_AMBULATORY_CARE_PROVIDER_SITE_OTHER): Payer: Medicare Other | Admitting: Family

## 2021-05-05 ENCOUNTER — Telehealth: Payer: Self-pay

## 2021-05-05 ENCOUNTER — Ambulatory Visit
Admission: RE | Admit: 2021-05-05 | Discharge: 2021-05-05 | Disposition: A | Discharge status: Home / Self Care | Payer: Medicare Other | Source: Ambulatory Visit | Attending: Family | Admitting: Family

## 2021-05-05 ENCOUNTER — Encounter: Payer: Self-pay | Admitting: Family

## 2021-05-05 ENCOUNTER — Other Ambulatory Visit: Payer: Self-pay

## 2021-05-05 VITALS — BP 110/68 | HR 99 | Temp 98.2°F | Ht 69.02 in | Wt 231.0 lb

## 2021-05-05 DIAGNOSIS — M7121 Synovial cyst of popliteal space [Baker], right knee: Secondary | ICD-10-CM | POA: Diagnosis not present

## 2021-05-05 DIAGNOSIS — I10 Essential (primary) hypertension: Secondary | ICD-10-CM | POA: Diagnosis not present

## 2021-05-05 DIAGNOSIS — M25561 Pain in right knee: Secondary | ICD-10-CM | POA: Diagnosis not present

## 2021-05-05 DIAGNOSIS — M7989 Other specified soft tissue disorders: Secondary | ICD-10-CM | POA: Diagnosis not present

## 2021-05-05 DIAGNOSIS — G8929 Other chronic pain: Secondary | ICD-10-CM | POA: Diagnosis not present

## 2021-05-05 DIAGNOSIS — E1140 Type 2 diabetes mellitus with diabetic neuropathy, unspecified: Secondary | ICD-10-CM | POA: Diagnosis not present

## 2021-05-05 LAB — POCT GLYCOSYLATED HEMOGLOBIN (HGB A1C): Hemoglobin A1C: 6.4 % — AB (ref 4.0–5.6)

## 2021-05-05 IMAGING — US US EXTREM LOW VENOUS
1 series · 13 of 24 positions shown · non-contrast
Comparison: None.



[Series 1: us extrem low venous · 0.08mm/px · 13 of 71 slices shown]
[im 1/71]
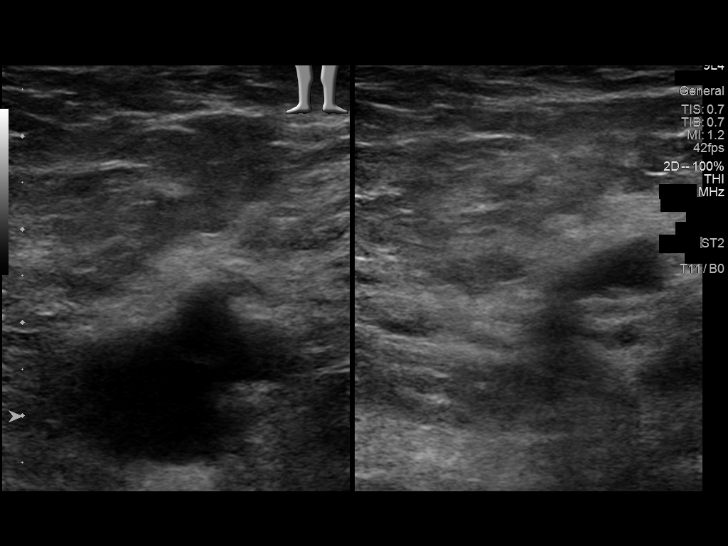
[im 7/71]
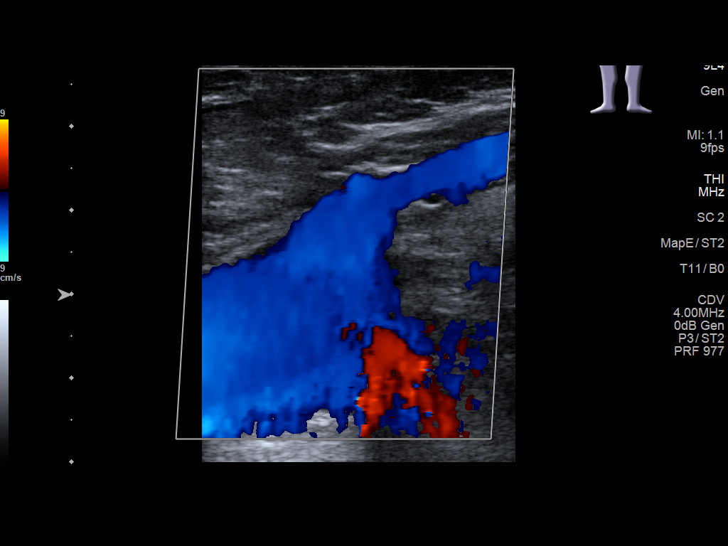
[im 13/71]
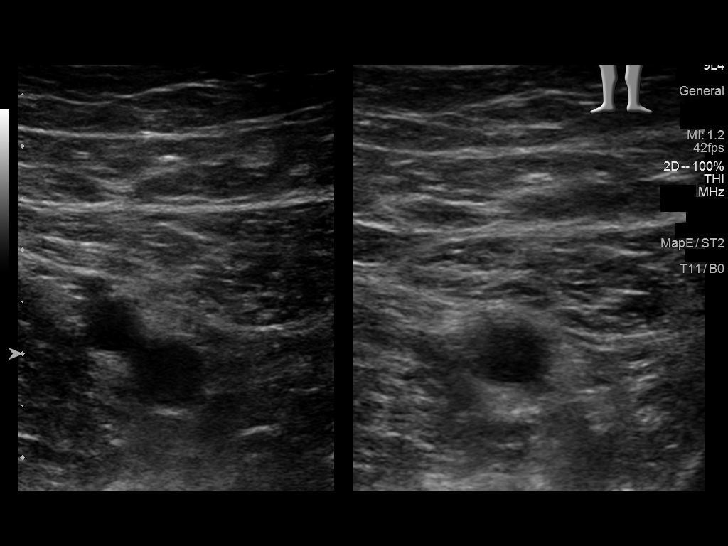
[im 19/71]
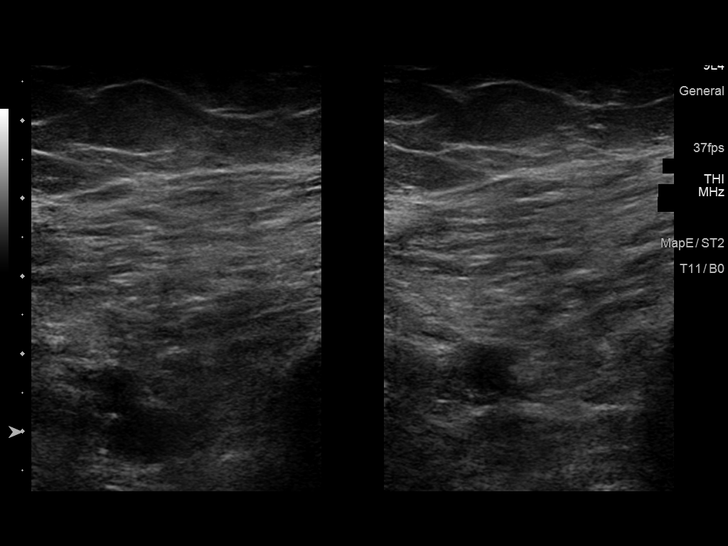
[im 25/71]
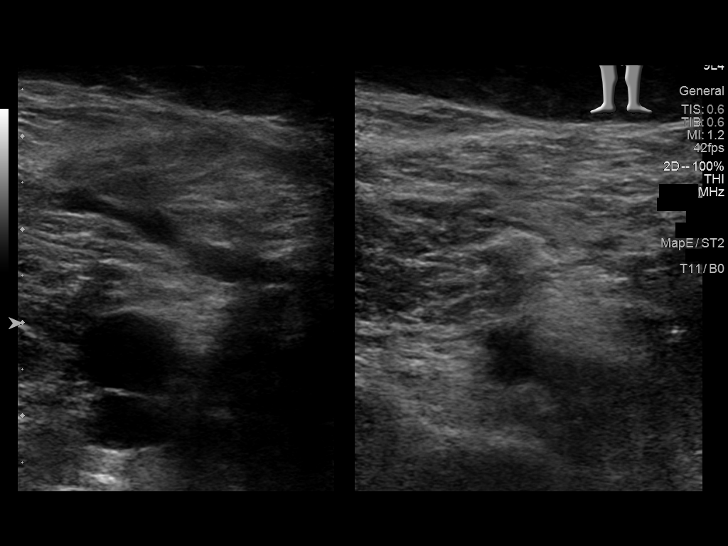
[im 31/71]
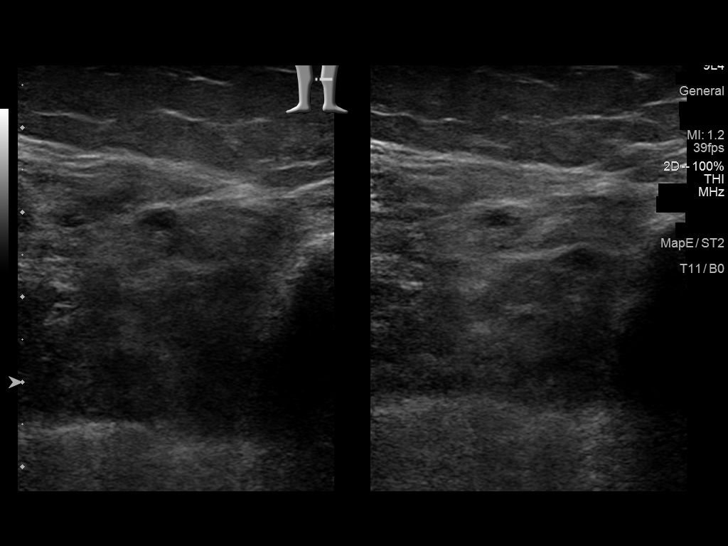
[im 37/71]
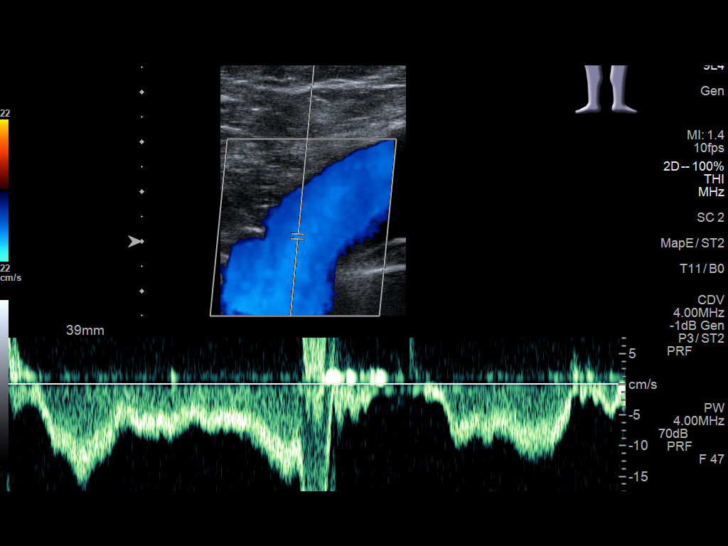
[im 40/71]
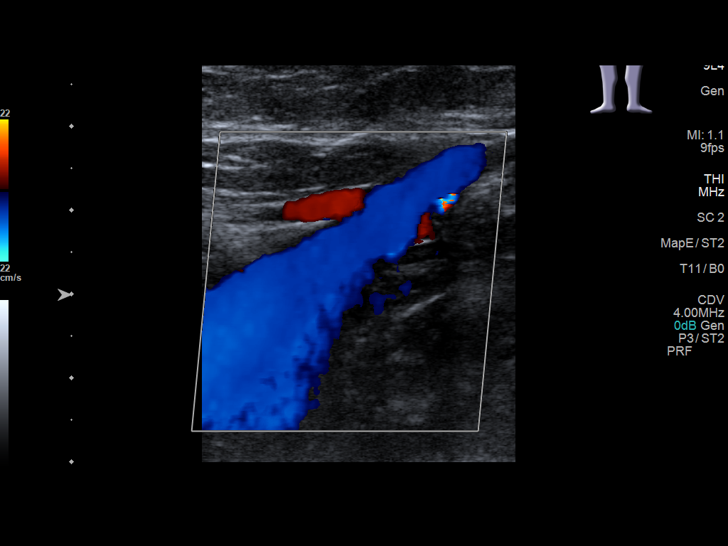
[im 46/71]
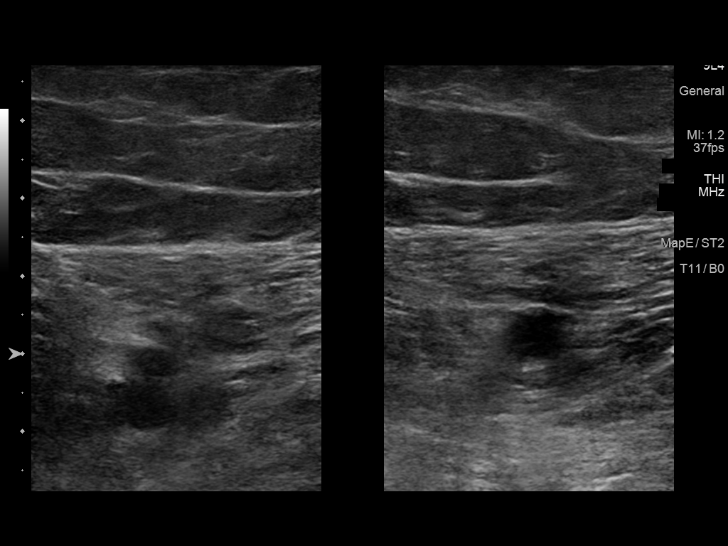
[im 52/71]
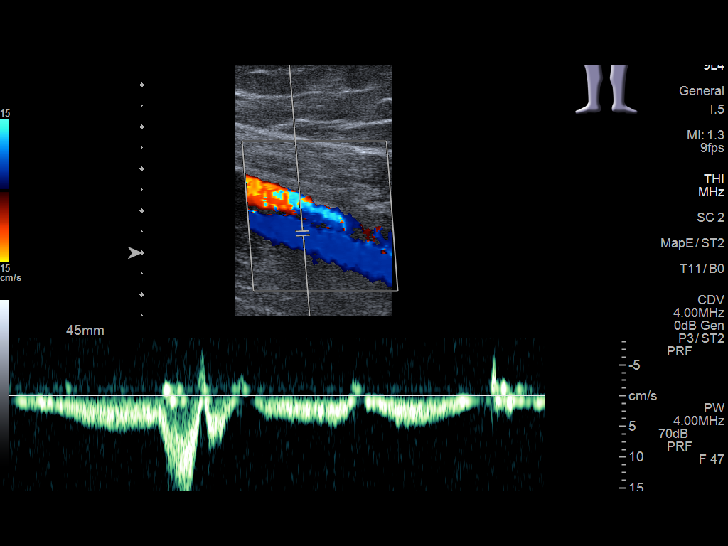
[im 58/71]
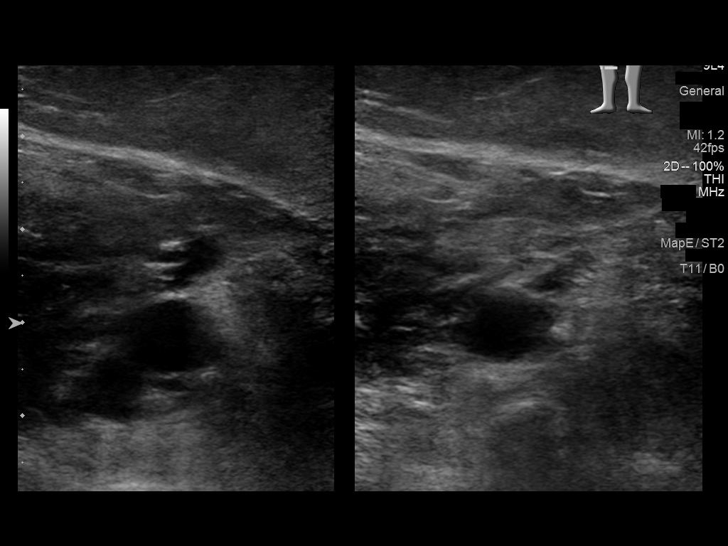
[im 64/71]
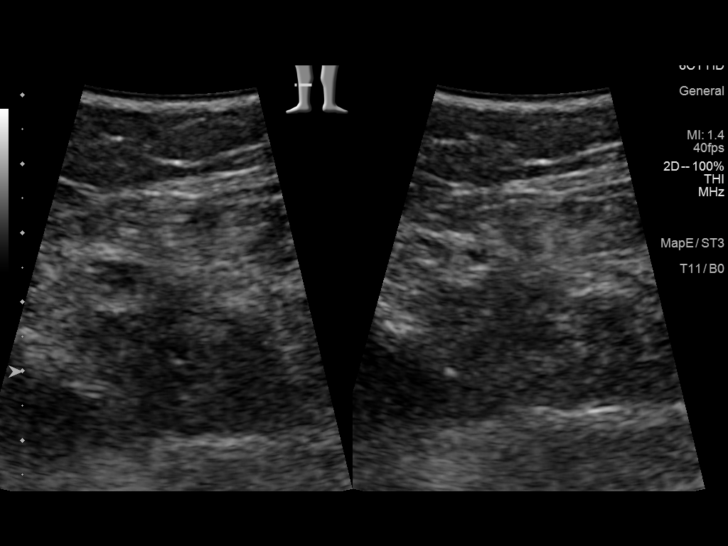
[im 71/71]
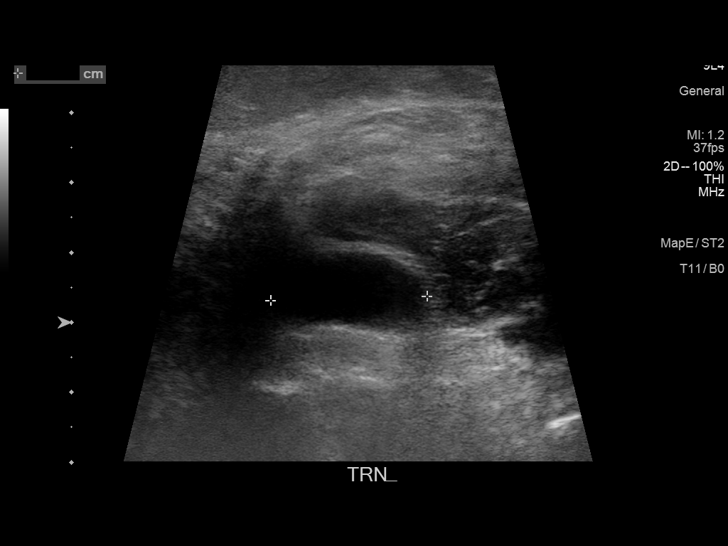

[13 of 24 positions shown; findings below may reference images not displayed]

FINDINGS: RIGHT LOWER EXTREMITY

Common Femoral Vein: No evidence of thrombus. Normal
compressibility, respiratory phasicity and response to augmentation.

Saphenofemoral Junction: No evidence of thrombus. Normal
compressibility and flow on color Doppler imaging.

Profunda Femoral Vein: No evidence of thrombus. Normal
compressibility and flow on color Doppler imaging.

Femoral Vein: No evidence of thrombus. Normal compressibility,
respiratory phasicity and response to augmentation.

Popliteal Vein: No evidence of thrombus. Normal compressibility,
respiratory phasicity and response to augmentation.

Calf Veins: No evidence of thrombus. Normal compressibility and flow
on color Doppler imaging.

Superficial Great Saphenous Vein: No evidence of thrombus. Normal
compressibility.

Venous Reflux:  None.

Other Findings: There is a right popliteal fossa cystic structure
measuring 3.6 x 1.3 x 2.2 cm, compatible with a Baker cyst.

LEFT LOWER EXTREMITY

Common Femoral Vein: No evidence of thrombus. Normal
compressibility, respiratory phasicity and response to augmentation.

Saphenofemoral Junction: No evidence of thrombus. Normal
compressibility and flow on color Doppler imaging.

Profunda Femoral Vein: No evidence of thrombus. Normal
compressibility and flow on color Doppler imaging.

Femoral Vein: No evidence of thrombus. Normal compressibility,
respiratory phasicity and response to augmentation.

Popliteal Vein: No evidence of thrombus. Normal compressibility,
respiratory phasicity and response to augmentation.

Calf Veins: No evidence of thrombus. Normal compressibility and flow
on color Doppler imaging.

Superficial Great Saphenous Vein: No evidence of thrombus. Normal
compressibility.

Venous Reflux:  None.

Other Findings:  None.
IMPRESSION: No evidence of deep venous thrombosis in either lower extremity.

## 2021-05-05 MED ORDER — TRAMADOL HCL 50 MG PO TABS: 50.0000 mg | ORAL_TABLET | Freq: Every day | ORAL | 1 refills | Status: AC | PRN

## 2021-05-05 MED ORDER — BLOOD GLUCOSE MONITOR KIT: PACK | 0 refills | Status: DC

## 2021-05-05 NOTE — Assessment & Plan Note (Addendum)
Symptoms most consistent with arthritic changes.  Patient declines further evaluation including x-ray in our office today or consult with orthopedics.  She would prefer to manage with medication.  Counseled patient in setting of chronic kidney disease, she should avoid all NSAIDs topical and oral.  I discontinued meloxicam.  In its place I have instructed her to use tramadol as needed approximately 3-4 times a week.  Counseled her that this medication is a controlled substance and needs to be stored in a safe place.  She will require follow-ups every 3 months.  Controlled substance contract has been completed today. I looked up patient on  Controlled Substances Reporting System PMP AWARE and saw no activity that raised concern of inappropriate use.

## 2021-05-05 NOTE — Progress Notes (Signed)
Subjective:    Patient ID: Cynthia [name], female    DOB: [birth year], 75 y.o.   MRN: [id number]  CC: Cynthia [name] is a 75 y.o. female who presents today for follow up.   HPI: Right knee pain, chronic. She has [facility] in the past. She didn't get corticosteroid injection due to DM at that time. She takes mobic 7.30m prn, approx 3 days per week with relief.   No low back pain currently. No numbness in legs, problems urinating or having bowel movement.  She is compliant with Cymbalta 660m   Complains of BLE swelling, which she noticed one week ago. Comes and goes Endorses salt indiscretion.  No CP, SOB,  calf pain, redness, heat in leg. Improves with elevation and support socks.  No h/o DVT.  She stays busy and physically actve with house work.      Hypertension-compliant with Norvasc 10 mg Hyzaar 50-12.5 mg, Lopressor 25 mg Diabetes-compliant with metformin 500 mg twice daily. Due DM eye exam  HLD- compliant with lipitor 2072mCoronary artery disease, aortic atherosclerosis, hypertension.  She is following with cardiologist Had follow-up 05/07/82[facility], [name],  [name], [name],Tasia Catchingseen 04/27/21.  left kidney cancer-continues to follow with [name],[name]   03/08/2021 MRI abdomen with and without contrast showed post ablation changes in the left kidney without evidence of residual or recurrent disease at the site of ablation. IR will continue to order surveillance imaging per Dr. SniDoristine Counterst note.  She will see Dr. SniDiamantina Providenceain in April 2023  CKD stage 3 with proteinuria-following with Dr.Korrapati.  Due for follow-up this month.  Advised to continue ARB for long-term cardiorenal protection.  CT chest 11/10/20.  Per [name],Tasia Catchingsontinue annual CT lung cancer screening.   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 denies   Diabetes mellitus [place] complication ([place]  type 2   GERD (gastroesophageal reflux disease)    Hyperlipidemia    Hypertension    Hypothyroidism    Lung nodule    Thyroid disease    Past Surgical History:  Procedure Laterality Date   BREAST BIOPSY Left 11/25/2015   stereo  neg   BREAST EXCISIONAL BIOPSY Left yrs ago   benign   COLONOSCOPY WITH PROPOFOL N/A 04/20/2017   Procedure: COLONOSCOPY WITH PROPOFOL;  Surgeon: AnnJonathon BellowsD;  [facility];  Service: Endoscopy;  Laterality: N/A;   COLONOSCOPY WITH PROPOFOL N/A 01/08/2018   Procedure: COLONOSCOPY WITH PROPOFOL;  Surgeon: AnnJonathon BellowsD;  [facility];  Service: Gastroenterology;  Laterality: N/A;   COLONOSCOPY WITH PROPOFOL N/A 08/10/2018   Procedure: COLONOSCOPY WITH PROPOFOL;  Surgeon: AnnJonathon BellowsD;  [facility];  Service: Gastroenterology;  Laterality: N/A;   COLONOSCOPY WITH PROPOFOL N/A 02/08/2021   Procedure: COLONOSCOPY WITH PROPOFOL;  Surgeon: AnnJonathon BellowsD;  [facility];  Service: Gastroenterology;  Laterality: N/A;   IR RADIOLOGIST EVAL & MGMT  04/08/2020   IR RADIOLOGIST EVAL & MGMT  05/26/2020   IR RADIOLOGIST EVAL & MGMT  09/29/2020   IR RADIOLOGIST EVAL & MGMT  03/10/2021   RADIOLOGY WITH ANESTHESIA N/A  04/29/2020   Procedure: CRYOABLATION;  Surgeon: Arne Cleveland, MD;  [facility];  Service: Radiology;  Laterality: N/A;   THYROIDECTOMY     Dr. [name]   VAGINAL DELIVERY     4   Family History  Problem Relation Age of Onset   Hypertension Mother    Hypertension Father    Diabetes Sister    Thyroid disease Sister    Breast cancer Maternal Aunt    Breast cancer Maternal Aunt     Allergies: Patient has no known allergies. Current Outpatient Medications on File Prior to Visit  Medication Sig  Dispense Refill   albuterol (VENTOLIN HFA) 108 (90 Base) MCG/ACT inhaler Inhale 2 puffs into the lungs every 6 (six) hours as needed for wheezing or shortness of breath. 8 g 1   amLODipine (NORVASC) 10 MG tablet TAKE 1 TABLET (10 MG TOTAL) BY MOUTH DAILY. 90 tablet 3   aspirin EC 81 MG tablet Take 81 mg by mouth daily. Swallow whole.     atorvastatin (LIPITOR) 20 MG tablet TAKE 1 TABLET BY MOUTH EVERY DAY 90 tablet 0   Calcium Carbonate-Vit D-Min (CALCIUM 600+D PLUS MINERALS) 600-400 MG-UNIT TABS Take 1 tablet by mouth daily.      diclofenac sodium (VOLTAREN) 1 % GEL Apply 1 application topically 4 (four) times daily as needed (knee pain.).      DULoxetine (CYMBALTA) 60 MG capsule Take 1 capsule (60 mg total) by mouth daily. 90 capsule 3   Lancets (ONETOUCH DELICA PLUS ZCHYIF02D) MISC USE AS INSTRUCTED 100 each 12   levothyroxine (SYNTHROID) 137 MCG tablet Take 1 tablet (137 mcg total) by mouth daily before breakfast. 90 tablet 1   losartan-hydrochlorothiazide (HYZAAR) 50-12.5 MG tablet Take 1 tablet by mouth daily. 90 tablet 1   metFORMIN (GLUCOPHAGE) 500 MG tablet TAKE 1 TABLET BY MOUTH TWICE A DAY 180 tablet 1   metoprolol tartrate (LOPRESSOR) 25 MG tablet Take 1 tablet (25 mg total) by mouth 2 (two) times daily. 90 tablet 1   ONETOUCH ULTRA test strip TEST BLOOD SUGAR 1-2 TIMES A DAY 100 strip 4   No current facility-administered medications on file prior to visit.    Social History   Tobacco Use   Smoking status: Former    Packs/day: 0.75    Years: 48.00    Pack years: 36.00    Types: Cigarettes    Quit date: 2015    Years since quitting: 7.6   Smokeless tobacco: Former    Types: Snuff, Chew  Vaping Use   Vaping Use: Never used  Substance Use Topics   Alcohol use: No   Drug use: No    Review of Systems  Constitutional:  Negative for chills and fever.  Respiratory:  Negative for cough and shortness of breath.   Cardiovascular:  Positive for leg swelling. Negative for chest  pain and palpitations.  Gastrointestinal:  Negative for nausea and vomiting.  Musculoskeletal:  Positive for arthralgias. Negative for back pain.  Skin:  Negative for rash.     Objective:    BP 110/68   Pulse 99   Temp 98.2 F (36.8 C)   Ht 5' 9.02" (1.753 m)   Wt 231 lb (104.8 kg)   SpO2 96%   BMI 34.10 kg/m  BP Readings from Last 3 Encounters:  05/05/21 110/68  04/27/21 124/79  04/20/21 109/71   Wt Readings from Last 3 Encounters:  05/05/21 231 lb (104.8 kg)  04/27/21 232 lb 12.8 oz (105.6 kg)  04/20/21 230 lb 12.8 oz (104.7 kg)    Physical Exam Vitals reviewed.  Constitutional:      Appearance: She is well-developed.  Eyes:     Conjunctiva/sclera: Conjunctivae normal.  Cardiovascular:     Rate and Rhythm: Normal rate and regular rhythm.     Pulses: Normal pulses.     Heart sounds: Normal heart sounds.     Comments: Non pitting BLE edema, palpable cords or masses. No erythema or increased warmth. Slight asymmetry in calf size when compared bilaterally, R > L.  LE hair growth symmetric and present. No discoloration or varicosities noted. LE warm and palpable pedal pulses.  Pulmonary:     Effort: Pulmonary effort is normal.     Breath sounds: Normal breath sounds. No wheezing, rhonchi or rales.  Musculoskeletal:     Right knee: No swelling, effusion or erythema. Normal range of motion. No tenderness.     Left knee: No effusion or erythema. Normal range of motion. No tenderness.  Skin:    General: Skin is warm and dry.  Neurological:     Mental Status: She is alert.  Psychiatric:        Speech: Speech normal.        Behavior: Behavior normal.        Thought Content: Thought content normal.       Assessment & Plan:   Problem List Items Addressed This Visit       Cardiovascular and Mediastinum   Hypertension    Chronic, controlled.continue Norvasc 10 mg Hyzaar 50-12.5 mg, Lopressor 25 mg        Endocrine   Type 2 diabetes mellitus with diabetic  neuropathy, unspecified ([place]) - Primary    Lab Results  Component Value Date   HGBA1C 6.4 (A) 05/05/2021  Chronic, controlled.  Continue metformin 500 mg twice daily      Relevant Medications   blood glucose meter kit and supplies KIT   Other Relevant Orders   POCT HgB A1C (Completed)     Other   Leg swelling    Slight asymmetry in calf sizes.  Pending bilateral ultrasound of legs.  Otherwise discussed conservative therapy including monitoring salt more diligently, use of compression stockings more regularly.  We could increase hydrochlorothiazide or start Lasix prn as cardiology recommended.   will follow for now.      Relevant Orders   US Venous Img Lower Bilateral   Right knee pain    Symptoms most consistent with arthritic changes.  Patient declines further evaluation including x-ray in our office today or consult with orthopedics.  She would prefer to manage with medication.  Counseled patient in setting of chronic kidney disease, she should avoid all NSAIDs topical and oral.  I discontinued meloxicam.  In its place I have instructed her to use tramadol as needed approximately 3-4 times a week.  Counseled her that this medication is a controlled substance and needs to be stored in a safe place.  She will require follow-ups every 3 months.  Controlled substance contract has been completed today. I looked up patient on [place] Controlled Substances Reporting System PMP AWARE and saw no activity that raised concern of inappropriate use.        Relevant Medications   traMADol (ULTRAM) 50 MG tablet     I have discontinued Enid Derry A. Stitely's meloxicam. I am also having her start on traMADol and blood glucose meter kit and supplies. Additionally, I am having her maintain her Calcium 600+D Plus  Minerals, diclofenac sodium, metoprolol tartrate, amLODipine, aspirin EC, albuterol, levothyroxine, DULoxetine, metFORMIN, OneTouch Ultra, losartan-hydrochlorothiazide, atorvastatin, and OneTouch Delica  Plus [id number].   Meds ordered this encounter  Medications   traMADol (ULTRAM) 50 MG tablet    Sig: Take 1 tablet (50 mg total) by mouth daily as needed for up to 5 days.    Dispense:  30 tablet    Refill:  1    Order Specific Question:   Supervising Provider    Answer:   Deborra Medina L [[id number]]   blood glucose meter kit and supplies KIT    Sig: Dispense based on patient and insurance preference. Use up to four times daily as directed.    Dispense:  1 each    Refill:  0    One touch Dx:E11.40    Order Specific Question:   Number of strips    Answer:   100    Order Specific Question:   Number of lancets    Answer:   100     Return precautions given.   Risks, benefits, and alternatives of the medications and treatment plan prescribed today were discussed, and patient expressed understanding.   Education regarding symptom management and diagnosis given to patient on AVS.  Continue to follow with Burnard Hawthorne, FNP for routine health maintenance.   Cynthia [name] and I agreed with plan.   Mable Paris, FNP  I have spent 40 minutes with a patient including precharting, exam, reviewing medical records, and discussion plan of care.

## 2021-05-05 NOTE — Assessment & Plan Note (Signed)
Slight asymmetry in calf sizes.  Pending bilateral ultrasound of legs.  Otherwise discussed conservative therapy including monitoring salt more diligently, use of compression stockings more regularly.  We could increase hydrochlorothiazide or start Lasix prn as cardiology recommended.   will follow for now.

## 2021-05-05 NOTE — Telephone Encounter (Signed)
Received a call from [place] at [place]. They wanted to confirm that we have received the [place] results for the lower extremity.

## 2021-05-05 NOTE — Patient Instructions (Signed)
Please stop using meloxicam.  Meloxicam as anti-inflammatory and this can harm your kidneys.  Do not use any over-the-counter anti-inflammatory medication such as ibuprofen, Motrin, Advil. I prescribed tramadol which does not harm your kidneys.    You may use this medication as needed daily for knee pain.  Please start with half a tablet.  You may break the 50 mg tablet in half to be a 25 mg tablet and try that first as discussed.    Please start this medication is safe place as it is a controlled substance it is an opioid like medication.  Follow-up in 3 months

## 2021-05-05 NOTE — Assessment & Plan Note (Signed)
Lab Results  Component Value Date   HGBA1C 6.4 (A) 05/05/2021   Chronic, controlled.  Continue metformin 500 mg twice daily

## 2021-05-05 NOTE — Assessment & Plan Note (Signed)
Chronic, controlled.continue Norvasc 10 mg Hyzaar 50-12.5 mg, Lopressor 25 mg

## 2021-05-06 ENCOUNTER — Ambulatory Visit: Payer: Medicare Other | Admitting: Podiatry

## 2021-05-06 ENCOUNTER — Encounter: Payer: Self-pay | Admitting: Podiatry

## 2021-05-06 DIAGNOSIS — I10 Essential (primary) hypertension: Secondary | ICD-10-CM | POA: Diagnosis not present

## 2021-05-06 DIAGNOSIS — E1122 Type 2 diabetes mellitus with diabetic chronic kidney disease: Secondary | ICD-10-CM | POA: Diagnosis not present

## 2021-05-06 DIAGNOSIS — M79675 Pain in left toe(s): Secondary | ICD-10-CM

## 2021-05-06 DIAGNOSIS — M79674 Pain in right toe(s): Secondary | ICD-10-CM | POA: Diagnosis not present

## 2021-05-06 DIAGNOSIS — E1140 Type 2 diabetes mellitus with diabetic neuropathy, unspecified: Secondary | ICD-10-CM | POA: Diagnosis not present

## 2021-05-06 DIAGNOSIS — C642 Malignant neoplasm of left kidney, except renal pelvis: Secondary | ICD-10-CM | POA: Diagnosis not present

## 2021-05-06 DIAGNOSIS — B351 Tinea unguium: Secondary | ICD-10-CM

## 2021-05-06 DIAGNOSIS — D631 Anemia in chronic kidney disease: Secondary | ICD-10-CM | POA: Diagnosis not present

## 2021-05-06 DIAGNOSIS — R809 Proteinuria, unspecified: Secondary | ICD-10-CM | POA: Diagnosis not present

## 2021-05-06 NOTE — Progress Notes (Signed)

## 2021-05-07 ENCOUNTER — Telehealth: Payer: Self-pay

## 2021-05-07 DIAGNOSIS — E1140 Type 2 diabetes mellitus with diabetic neuropathy, unspecified: Secondary | ICD-10-CM

## 2021-05-07 MED ORDER — BLOOD GLUCOSE MONITOR KIT
PACK | 0 refills | Status: DC
Start: 2021-05-07 — End: 2021-05-17

## 2021-05-07 NOTE — Telephone Encounter (Signed)
Pt states that the [facility] [address] does not have the rx for a new glucose meter kit. Please call in

## 2021-05-10 ENCOUNTER — Ambulatory Visit: Payer: Medicare Other | Admitting: Family

## 2021-05-17 ENCOUNTER — Telehealth: Payer: Self-pay

## 2021-05-17 ENCOUNTER — Other Ambulatory Visit: Payer: Self-pay

## 2021-05-17 DIAGNOSIS — E1140 Type 2 diabetes mellitus with diabetic neuropathy, unspecified: Secondary | ICD-10-CM

## 2021-05-17 MED ORDER — BLOOD GLUCOSE MONITOR KIT: PACK | 0 refills | Status: AC

## 2021-05-17 NOTE — Telephone Encounter (Signed)
Placed call to pt. Pt states she was onlu given 7 tablets and never received her prescription for her glucometer. Placed call to pharmacy, pharmacy tech stated that insurance only covered 7 for the first fill . Pharmacy tech stated he will fill the 30 for the pt now. Pharmacy tech also stated the glucometer prescription is not in there system. New Rx has been sent in. Placed call to pt to inform her of tramadol and glucometer.

## 2021-05-17 NOTE — Telephone Encounter (Signed)
Call pt  That is an error . She may disregard the ' as needed for up to 5 days.'  She may take tramadol 50mg  PO QD prn  There are 30 tablets and 1 refill

## 2021-05-17 NOTE — Telephone Encounter (Signed)
Pt called and wants to know if she is supposed to get a refill on traMADol (ULTRAM) 50 MG tablet. Please call back to advise

## 2021-05-19 ENCOUNTER — Inpatient Hospital Stay: Payer: Medicare Other | Attending: Oncology | Admitting: Licensed Clinical Social Worker

## 2021-05-19 ENCOUNTER — Inpatient Hospital Stay: Payer: Medicare Other

## 2021-05-19 ENCOUNTER — Encounter: Payer: Self-pay | Admitting: Licensed Clinical Social Worker

## 2021-05-19 DIAGNOSIS — Z807 Family history of other malignant neoplasms of lymphoid, hematopoietic and related tissues: Secondary | ICD-10-CM

## 2021-05-19 DIAGNOSIS — Z8042 Family history of malignant neoplasm of prostate: Secondary | ICD-10-CM

## 2021-05-19 DIAGNOSIS — C7A8 Other malignant neuroendocrine tumors: Secondary | ICD-10-CM | POA: Diagnosis not present

## 2021-05-19 DIAGNOSIS — C642 Malignant neoplasm of left kidney, except renal pelvis: Secondary | ICD-10-CM | POA: Diagnosis not present

## 2021-05-19 DIAGNOSIS — Z803 Family history of malignant neoplasm of breast: Secondary | ICD-10-CM | POA: Diagnosis not present

## 2021-05-19 NOTE — Progress Notes (Signed)
REFERRING PROVIDER: Earlie Server, MD [place],  [place] [address]  PRIMARY PROVIDER:  Burnard Hawthorne, FNP  PRIMARY REASON FOR VISIT:  1. Renal carcinoma, left ([place])   2. Neuroendocrine carcinoma of colon ([place])   3. Family history of breast cancer   4. Family history of prostate cancer   5. Family history of lymphoma      HISTORY OF PRESENT ILLNESS:   Cynthia [name], a 75 y.o. female, was seen for a [place] cancer genetics consultation at the request of Dr. Tasia Catchings due to a personal and family history of cancer.  Cynthia [name] presents to clinic today to discuss the possibility of a hereditary predisposition to cancer, genetic testing, and to further clarify her future cancer risks, as well as potential cancer risks for family members.   In 2019, at the age of 32, Cynthia [name] was diagnosed with neuroendocrine carcinoma of the sigmoid colon. This is treated with surveillance.  In 2021, she was diagnosed with suspected renal cell carcinoma. She had cryoablation for this.    CANCER HISTORY:  Oncology History   No history exists.     RISK FACTORS:  Menarche was at age 62-13.  First live birth at age 35.  Ovaries intact: yes.  Hysterectomy: no.  Menopausal status: postmenopausal.  Colonoscopy: yes;  several, has had polyps as well.  Marland Kitchen Approximately 21 can be seen in her chart, but she reports having more colonoscopies. Many of these are tubular adenomas.  Mammogram within the last year: yes. Number of breast biopsies: 2.   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 denies   Diabetes mellitus without complication ([place])    type 2   Family history of breast cancer    Family history of lymphoma    Family history of prostate cancer    GERD (gastroesophageal reflux disease)    Hyperlipidemia    Hypertension     Hypothyroidism    Lung nodule    Thyroid disease     Past Surgical History:  Procedure Laterality Date   BREAST BIOPSY Left 11/25/2015   stereo  neg   BREAST EXCISIONAL BIOPSY Left yrs ago   benign   COLONOSCOPY WITH PROPOFOL N/A 04/20/2017   Procedure: COLONOSCOPY WITH PROPOFOL;  Surgeon: [name], MD;  [facility];  Service: Endoscopy;  Laterality: N/A;   COLONOSCOPY WITH PROPOFOL N/A 01/08/2018   Procedure: COLONOSCOPY WITH PROPOFOL;  Surgeon: [name], MD;  [facility];  Service: Gastroenterology;  Laterality: N/A;   COLONOSCOPY WITH PROPOFOL N/A 08/10/2018   Procedure: COLONOSCOPY WITH PROPOFOL;  Surgeon: [name], MD;  Location: [facility]COPY;  Service: Gastroenterology;  Laterality: N/A;   COLONOSCOPY WITH PROPOFOL N/A 02/08/2021   Procedure: COLONOSCOPY WITH PROPOFOL;  Surgeon: [name], MD;  [facility];  Service: Gastroenterology;  Laterality: N/A;   IR RADIOLOGIST EVAL & MGMT  04/08/2020   IR RADIOLOGIST EVAL & MGMT  05/26/2020   IR RADIOLOGIST EVAL & MGMT  09/29/2020   IR RADIOLOGIST EVAL & MGMT  03/10/2021   RADIOLOGY WITH ANESTHESIA N/A 04/29/2020   Procedure: CRYOABLATION;  Surgeon: Arne Cleveland, MD;  [facility];  Service: Radiology;  Laterality: N/A;   THYROIDECTOMY     Dr. [name]   VAGINAL DELIVERY  4    Social History   Socioeconomic History   Marital status: Married    Spouse name: Not on file   Number of children: 4   Years of education: Not on file   Highest education level: Not on file  Occupational History   Not on file  Tobacco Use   Smoking status: Former    Packs/day: 0.75    Years: 48.00    Pack years: 36.00    Types: Cigarettes    Quit date: 2015    Years since quitting: 7.6   Smokeless tobacco: Former    Types: Snuff, Chew  Vaping Use   Vaping Use: Never used  Substance and Sexual Activity   Alcohol use: No   Drug use: No   Sexual activity: Not Currently  Other Topics Concern    Not on file  Social History Narrative   Lives in [place] with husband. Has 4 children.      4 grandchildren.      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  Significant diagnoses are listed below: Family History  Problem Relation Age of Onset   Hypertension Mother    Hypertension Father    Prostate cancer Father 27   Diabetes Sister    Thyroid disease Sister    Breast cancer Sister 32   Breast cancer Sister 29   Lymphoma Sister 35   Breast cancer Maternal Aunt    Breast cancer Maternal Aunt    Cancer Paternal Grandmother        unk type   Cynthia [name] has 2 sons, 2 daughters, no cancers. She had 5 full brothers, 3 full sisters, 2 maternal half sisters, 2 maternal half brothers. Two of her full sisters have had breast cancer, one at 75, the other at 45. Her other full sister had lymphoma and just finished treatment, she is 21.   Cynthia [name] mother died at 59 of Alzheimer's. Patient had approximately 10 maternal aunts/uncles. Two of her aunts had breast cancer at older ages. No other known cancers in her aunts/uncles/cousins. Maternal grandparents both may have had cancers, unsure types.  Cynthia [name] father had prostate cancer at 70 and died at 40. Patient is unsure of how many paternal aunts/uncles there were, but she is not aware of any other cancers. Paternal grandmother had cancer, unknown type. No info about maternal grandfather.  Cynthia [name] is unaware of previous family history of genetic testing for hereditary cancer risks. There is no reported Ashkenazi Jewish ancestry. There is no known  consanguinity    GENETIC COUNSELING ASSESSMENT: Cynthia [name] is a 75 y.o. female with a personal and family history which is somewhat suggestive of a hereditary cancer syndrome and predisposition to cancer. We, therefore, discussed and recommended the following at today's visit.   DISCUSSION: We discussed that approximately 5-10% of breast cancer is hereditary. Most cases of hereditary breast cancer are associated with BRCA1/BRCA2 genes, although there are other genes associated with hereditary cancer as well. Cancers and risks are gene specific. There are some genes  associated with neuroendocrine tumors, lymphoma, and kidney cancer.  We discussed that testing is beneficial for several reasons including knowing about other cancer risks, identifying potential screening and risk-reduction options that may be appropriate, and to understand if other family members could be at risk for cancer and allow them to undergo genetic testing.   We reviewed the characteristics, features and inheritance patterns of hereditary cancer syndromes. We also discussed genetic testing, including the appropriate family members to test, the process of testing, insurance coverage and turn-around-time for results. We discussed the implications of a negative, positive and/or variant of uncertain significant result. We recommended Cynthia [name] pursue genetic testing for the [facility] Multi-Cancer+RNA gene panel.   The Multi-Cancer Panel + RNA offered by [facility] includes sequencing and/or deletion duplication testing of the following 84 genes: AIP, ALK, APC, ATM, AXIN2,BAP1,  BARD1, BLM, BMPR1A, BRCA1, BRCA2, BRIP1, CASR, CDC73, CDH1, CDK4, CDKN1B, CDKN1C, CDKN2A (p14ARF), CDKN2A (p16INK4a), CEBPA, CHEK2, CTNNA1, DICER1, DIS3L2, EGFR (c.2369C>T, p.Thr790Met variant only), EPCAM (Deletion/duplication testing only), FH, FLCN, GATA2, GPC3, GREM1 (Promoter region deletion/duplication testing only), HOXB13 (c.251G>A, p.Gly84Glu), HRAS, KIT, MAX,  MEN1, MET, MITF (c.952G>A, p.Glu318Lys variant only), MLH1, MSH2, MSH3, MSH6, MUTYH, NBN, NF1, NF2, NTHL1, PALB2, PDGFRA, PHOX2B, PMS2, POLD1, POLE, POT1, PRKAR1A, PTCH1, PTEN, RAD50, RAD51C, RAD51D, RB1, RECQL4, RET, RUNX1, SDHAF2, SDHA (sequence changes only), SDHB, SDHC, SDHD, SMAD4, SMARCA4, SMARCB1, SMARCE1, STK11, SUFU, TERC, TERT, TMEM127, TP53, TSC1, TSC2, VHL, WRN and WT1.  Based on Cynthia [name]'s personal and family history of cancer, she meets medical criteria for genetic testing. Despite that she meets criteria, she may still have an out of pocket cost. We discussed that if her out of pocket cost for testing is over $100, the laboratory will call and confirm whether she wants to proceed with testing.  If the out of pocket cost of testing is less than $100 she will be billed by the genetic testing laboratory.   PLAN: After considering the risks, benefits, and limitations, Cynthia [name] provided informed consent to pursue genetic testing and the blood sample was sent to [facility] for analysis of the Multi-Cancer+RNA panel. Results should be available within approximately 2-3 weeks' time, at which point they will be disclosed by telephone to Cynthia [name], as will any additional recommendations warranted by these results. Cynthia [name] will receive a summary of her genetic counseling visit and a copy of her results once available. This information will also be available in Epic.   Cynthia [name] questions were answered to her satisfaction today. Our contact information was provided should additional questions or concerns arise. Thank you for the referral and allowing [place] to share in the care of your patient.   Faith Rogue, MS, [facility] Genetic Counselor [email or web address] Phone: [phone]  The patient was seen for a total of 30 minutes in face-to-face genetic counseling.  Patient was seen alone. Dr. Grayland Ormond was available for discussion regarding this case.    _______________________________________________________________________ For Office Staff:  Number of people involved in session: 1 Was an Intern/ student involved with case: no

## 2021-05-20 ENCOUNTER — Telehealth: Payer: Self-pay | Admitting: Family

## 2021-05-20 DIAGNOSIS — R202 Paresthesia of skin: Secondary | ICD-10-CM | POA: Diagnosis not present

## 2021-05-20 DIAGNOSIS — R200 Anesthesia of skin: Secondary | ICD-10-CM | POA: Diagnosis not present

## 2021-05-20 NOTE — Telephone Encounter (Signed)
Access nurse stated patient was instructed to go to UC/ED for her sx. Patient verbalized understanding.

## 2021-05-20 NOTE — Telephone Encounter (Signed)
Patient informed, Due to the high volume of calls and your symptoms we have to forward your call to our Triage Nurse to expedient your call. Please hold for the transfer.  Patient transferred to [facility]. Due to having pain and numbness in her right wrist that has increased and she is unable to sleep due to the pain.No openings in office or virtual.Patient dropped the call,Kim at [facility] created a chart and they will call the patient to triage her.

## 2021-05-21 ENCOUNTER — Other Ambulatory Visit: Payer: Self-pay | Admitting: Family

## 2021-05-21 DIAGNOSIS — M25561 Pain in right knee: Secondary | ICD-10-CM

## 2021-05-21 DIAGNOSIS — G8929 Other chronic pain: Secondary | ICD-10-CM

## 2021-05-21 NOTE — Progress Notes (Signed)
I have called [facility] she is established with them. She has an appointment 11/02/21. Pt called and is aware.

## 2021-05-21 NOTE — Telephone Encounter (Signed)
I called patient & line was busy. Will try back.

## 2021-05-21 NOTE — Telephone Encounter (Signed)
Call patient Please ensure that she was seen in emergency room, as I do not see notes as relates to this, for right hand numbness

## 2021-05-25 NOTE — Telephone Encounter (Signed)
Patient seen at [facility] 05/20/21.

## 2021-05-26 ENCOUNTER — Other Ambulatory Visit: Payer: Self-pay | Admitting: Family

## 2021-05-26 DIAGNOSIS — I10 Essential (primary) hypertension: Secondary | ICD-10-CM

## 2021-06-02 ENCOUNTER — Other Ambulatory Visit: Payer: Self-pay | Admitting: Family

## 2021-06-02 DIAGNOSIS — E039 Hypothyroidism, unspecified: Secondary | ICD-10-CM

## 2021-06-07 ENCOUNTER — Other Ambulatory Visit: Payer: Self-pay | Admitting: Family

## 2021-06-07 DIAGNOSIS — E785 Hyperlipidemia, unspecified: Secondary | ICD-10-CM

## 2021-06-09 ENCOUNTER — Other Ambulatory Visit: Payer: Self-pay | Admitting: Family

## 2021-06-09 DIAGNOSIS — I10 Essential (primary) hypertension: Secondary | ICD-10-CM

## 2021-06-14 ENCOUNTER — Other Ambulatory Visit: Payer: Self-pay | Admitting: Family

## 2021-06-14 DIAGNOSIS — E1140 Type 2 diabetes mellitus with diabetic neuropathy, unspecified: Secondary | ICD-10-CM

## 2021-06-14 NOTE — Telephone Encounter (Signed)
Patient is calling in to request a refill on her losartan-hydrochlorothiazide (HYZAAR) 50-12.5 MG tablet and also for a new glucose meter.Please send her prescriptions to [facility] in [place].

## 2021-06-14 NOTE — Telephone Encounter (Signed)
Called and spoke to Toneka's daughter Festus Barren. Shanon was informed that the Hyzaar medication was already sent in to the pharmacy and asked if there was an issue with the meter sent in in august. Shanon states that Jashira is not home currently and will call back with the information.

## 2021-06-15 MED ORDER — ONETOUCH DELICA LANCING DEV MISC: 1.0000 | Freq: Every day | 2 refills | Status: DC

## 2021-06-15 NOTE — Telephone Encounter (Signed)
Cynthia [name] called our office back concerning her Glucose Meter. Pt states that her meter came with a separate lancing device and she has lost the guide cap that holds the needle and protects her finger. Pt asks if a new Lancing device could be sent in to her pharmacy. Pended a ONETOUCH DELICA LANCING DEV for order. Ok to order for patient?

## 2021-06-22 ENCOUNTER — Telehealth: Payer: Self-pay | Admitting: Licensed Clinical Social Worker

## 2021-06-23 ENCOUNTER — Encounter: Payer: Self-pay | Admitting: Licensed Clinical Social Worker

## 2021-06-23 ENCOUNTER — Ambulatory Visit: Payer: Self-pay | Admitting: Licensed Clinical Social Worker

## 2021-06-23 DIAGNOSIS — C642 Malignant neoplasm of left kidney, except renal pelvis: Secondary | ICD-10-CM

## 2021-06-23 DIAGNOSIS — C7A8 Other malignant neuroendocrine tumors: Secondary | ICD-10-CM

## 2021-06-23 DIAGNOSIS — Z1379 Encounter for other screening for genetic and chromosomal anomalies: Secondary | ICD-10-CM | POA: Insufficient documentation

## 2021-06-23 DIAGNOSIS — Z807 Family history of other malignant neoplasms of lymphoid, hematopoietic and related tissues: Secondary | ICD-10-CM

## 2021-06-23 DIAGNOSIS — Z8042 Family history of malignant neoplasm of prostate: Secondary | ICD-10-CM

## 2021-06-23 DIAGNOSIS — Z803 Family history of malignant neoplasm of breast: Secondary | ICD-10-CM

## 2021-06-23 NOTE — Telephone Encounter (Signed)
Revealed negative genetic testing.  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  We discussed that we do not know why she has  cancer or why there is cancer in the family. It could be due to a different gene that we are not testing, or something our current technology cannot pick up.  It will be important for her to keep in contact with genetics to learn if additional testing may be needed in the future.

## 2021-06-23 NOTE — Progress Notes (Signed)
HPI:  Ms. Rosensteel was previously seen in the [place] clinic due to a personal and family history of cancer and concerns regarding a hereditary predisposition to cancer. Please refer to our prior cancer genetics clinic note for more information regarding our discussion, assessment and recommendations, at the time. Ms. Kamphuis recent genetic test results were disclosed to her, as were recommendations warranted by these results. These results and recommendations are discussed in more detail below.  CANCER HISTORY:  Oncology History   No history exists.    FAMILY HISTORY:  We obtained a detailed, 4-generation family history.  Significant diagnoses are listed below: Family History  Problem Relation Age of Onset   Hypertension Mother    Hypertension Father    Prostate cancer Father 2   Diabetes Sister    Thyroid disease Sister    Breast cancer Sister 31   Breast cancer Sister 13   Lymphoma Sister 60   Breast cancer Maternal Aunt    Breast cancer Maternal Aunt    Cancer Paternal Grandmother        unk type   Ms. Wignall has 2 sons, 2 daughters, no cancers. She had 5 full brothers, 3 full sisters, 2 maternal half sisters, 2 maternal half brothers. Two of her full sisters have had breast cancer, one at 54, the other at 57. Her other full sister had lymphoma and just finished treatment, she is 39.    Ms. Romanek mother died at 26 of Alzheimer's. Patient had approximately 10 maternal aunts/uncles. Two of her aunts had breast cancer at older ages. No other known cancers in her aunts/uncles/cousins. Maternal grandparents both may have had cancers, unsure types.   Ms. Bruyere father had prostate cancer at 26 and died at 89. Patient is unsure of how many paternal aunts/uncles there were, but she is not aware of any other cancers. Paternal grandmother had cancer, unknown type. No info about maternal grandfather.   Ms. Hubner is unaware of previous family history of genetic testing  for hereditary cancer risks. There is no reported Ashkenazi Jewish ancestry. There is no known consanguinity      GENETIC TEST RESULTS: Genetic testing reported out on 06/21/2021 through the [facility] Multi-Cancer+RNA  cancer panel found no pathogenic mutations.   The Multi-Cancer Panel + RNA offered by [facility] includes sequencing and/or deletion duplication testing of the following 84 genes: AIP, ALK, APC, ATM, AXIN2,BAP1,  BARD1, BLM, BMPR1A, BRCA1, BRCA2, BRIP1, CASR, CDC73, CDH1, CDK4, CDKN1B, CDKN1C, CDKN2A (p14ARF), CDKN2A (p16INK4a), CEBPA, CHEK2, CTNNA1, DICER1, DIS3L2, EGFR (c.2369C>T, p.Thr790Met variant only), EPCAM (Deletion/duplication testing only), FH, FLCN, GATA2, GPC3, GREM1 (Promoter region deletion/duplication testing only), HOXB13 (c.251G>A, p.Gly84Glu), HRAS, KIT, MAX, MEN1, MET, MITF (c.952G>A, p.Glu318Lys variant only), MLH1, MSH2, MSH3, MSH6, MUTYH, NBN, NF1, NF2, NTHL1, PALB2, PDGFRA, PHOX2B, PMS2, POLD1, POLE, POT1, PRKAR1A, PTCH1, PTEN, RAD50, RAD51C, RAD51D, RB1, RECQL4, RET, RUNX1, SDHAF2, SDHA (sequence changes only), SDHB, SDHC, SDHD, SMAD4, SMARCA4, SMARCB1, SMARCE1, STK11, SUFU, TERC, TERT, TMEM127, TP53, TSC1, TSC2, VHL, WRN and WT1.   The test report has been scanned into EPIC and is located under the Molecular Pathology section of the Results Review tab.  A portion of the result report is included below for reference.     We discussed that because current genetic testing is not perfect, it is possible there may be a gene mutation in one of these genes that current testing cannot detect, but that chance is small.  There could be another gene that has not yet  been discovered, or that we have not yet tested, that is responsible for the cancer diagnoses in the family. It is also possible there is a hereditary cause for the cancer in the family that Ms. Nicotra did not inherit and therefore was not identified in her testing.  Therefore, it is important to remain in touch with  cancer genetics in the future so that we can continue to offer Ms. Yo the most up to date genetic testing.   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  At this time, it is unknown if this variant is associated with increased cancer risk or if this is a normal finding, but most variants such as this get reclassified to being inconsequential. It should not be used to make medical management decisions. With time, we suspect the lab will determine the significance of this variant, if any. If we do learn more about it we will try to contact Ms. Formby to discuss it further. However, it is important to stay in touch with [place] periodically and keep the address and phone number up to date.  ADDITIONAL GENETIC TESTING: We discussed with Ms. Fissel that her genetic testing was fairly extensive.  If there are genes identified to increase cancer risk that can be analyzed in the future, we would be happy to discuss and coordinate this testing at that time.    CANCER SCREENING RECOMMENDATIONS: Ms. Demby test result is considered negative (normal).  This means that we have not identified a hereditary cause for her  personal and family history of cancer at this time. Most cancers happen by chance and this negative test suggests that her cancer may fall into this category.    While reassuring, this does not definitively rule out a hereditary predisposition to cancer. It is still possible that there could be genetic mutations that are undetectable by current technology. There could be genetic mutations in genes that have not been tested or identified to increase cancer risk.  Therefore, it is recommended she continue to follow the cancer management and screening guidelines provided by her oncology and primary healthcare provider.   An individual's cancer risk and medical management are not determined by genetic test results alone. Overall cancer risk  assessment incorporates additional factors, including personal medical history, family history, and any available genetic information that may result in a personalized plan for cancer prevention and surveillance.  RECOMMENDATIONS FOR FAMILY MEMBERS:  Relatives in this family might be at some increased risk of developing cancer, over the general population risk, simply due to the family history of cancer.  We recommended female relatives in this family have a yearly mammogram beginning at age 61, or 49 years younger than the earliest onset of cancer, an annual clinical breast exam, and perform monthly breast self-exams. Female relatives in this family should also have a gynecological exam as recommended by their primary provider.  All family members should be referred for colonoscopy starting at age 15.   It is also possible there is a hereditary cause for the cancer in Ms. Barabas's family that she did not inherit and therefore was not identified in her.  Based on Ms. Brannick's family history, we recommended sisters who had breast cancer/maternal relatives have genetic counseling and testing. Ms. Ure will let us know if we can be of any assistance in coordinating genetic counseling and/or testing for these family members.  FOLLOW-UP: Lastly, we discussed with Ms. Windom that cancer genetics  is a rapidly advancing field and it is possible that new genetic tests will be appropriate for her and/or her family members in the future. We encouraged her to remain in contact with cancer genetics on an annual basis so we can update her personal and family histories and let her know of advances in cancer genetics that may benefit this family.   Our contact number was provided. Ms. Daniely questions were answered to her satisfaction, and she knows she is welcome to call us at anytime with additional questions or concerns.   Faith Rogue, MS, [facility] Genetic Counselor [email or web address] Phone:  [phone]

## 2021-08-05 ENCOUNTER — Ambulatory Visit: Payer: Medicare Other | Admitting: Podiatry

## 2021-08-05 ENCOUNTER — Encounter: Payer: Self-pay | Admitting: Podiatry

## 2021-08-05 ENCOUNTER — Other Ambulatory Visit: Payer: Self-pay

## 2021-08-05 DIAGNOSIS — E1140 Type 2 diabetes mellitus with diabetic neuropathy, unspecified: Secondary | ICD-10-CM

## 2021-08-05 DIAGNOSIS — M79675 Pain in left toe(s): Secondary | ICD-10-CM | POA: Diagnosis not present

## 2021-08-05 DIAGNOSIS — M79674 Pain in right toe(s): Secondary | ICD-10-CM | POA: Diagnosis not present

## 2021-08-05 DIAGNOSIS — B351 Tinea unguium: Secondary | ICD-10-CM

## 2021-08-05 NOTE — Progress Notes (Signed)

## 2021-08-09 ENCOUNTER — Ambulatory Visit: Payer: Medicare Other | Admitting: Family

## 2021-08-25 ENCOUNTER — Other Ambulatory Visit: Payer: Self-pay | Admitting: Family

## 2021-08-25 DIAGNOSIS — E785 Hyperlipidemia, unspecified: Secondary | ICD-10-CM

## 2021-09-22 ENCOUNTER — Ambulatory Visit: Payer: Medicare Other | Admitting: Family

## 2021-09-24 ENCOUNTER — Other Ambulatory Visit: Payer: Self-pay | Admitting: Family

## 2021-09-24 DIAGNOSIS — E1140 Type 2 diabetes mellitus with diabetic neuropathy, unspecified: Secondary | ICD-10-CM

## 2021-10-21 ENCOUNTER — Other Ambulatory Visit: Payer: Self-pay | Admitting: Interventional Radiology

## 2021-10-21 DIAGNOSIS — N2889 Other specified disorders of kidney and ureter: Secondary | ICD-10-CM

## 2021-10-27 ENCOUNTER — Ambulatory Visit (INDEPENDENT_AMBULATORY_CARE_PROVIDER_SITE_OTHER): Payer: Medicare Other | Admitting: Family

## 2021-10-27 ENCOUNTER — Inpatient Hospital Stay: Payer: Medicare Other | Attending: Oncology

## 2021-10-27 ENCOUNTER — Other Ambulatory Visit: Payer: Self-pay

## 2021-10-27 ENCOUNTER — Encounter: Payer: Self-pay | Admitting: Family

## 2021-10-27 VITALS — BP 110/60 | HR 89 | Temp 98.9°F | Ht 69.0 in | Wt 229.6 lb

## 2021-10-27 DIAGNOSIS — M7989 Other specified soft tissue disorders: Secondary | ICD-10-CM | POA: Diagnosis not present

## 2021-10-27 DIAGNOSIS — Z8349 Family history of other endocrine, nutritional and metabolic diseases: Secondary | ICD-10-CM | POA: Insufficient documentation

## 2021-10-27 DIAGNOSIS — C642 Malignant neoplasm of left kidney, except renal pelvis: Secondary | ICD-10-CM | POA: Diagnosis not present

## 2021-10-27 DIAGNOSIS — Z87891 Personal history of nicotine dependence: Secondary | ICD-10-CM | POA: Insufficient documentation

## 2021-10-27 DIAGNOSIS — C7A8 Other malignant neuroendocrine tumors: Secondary | ICD-10-CM | POA: Insufficient documentation

## 2021-10-27 DIAGNOSIS — I10 Essential (primary) hypertension: Secondary | ICD-10-CM | POA: Insufficient documentation

## 2021-10-27 DIAGNOSIS — Z8249 Family history of ischemic heart disease and other diseases of the circulatory system: Secondary | ICD-10-CM | POA: Insufficient documentation

## 2021-10-27 DIAGNOSIS — Z23 Encounter for immunization: Secondary | ICD-10-CM | POA: Diagnosis not present

## 2021-10-27 DIAGNOSIS — E1140 Type 2 diabetes mellitus with diabetic neuropathy, unspecified: Secondary | ICD-10-CM | POA: Diagnosis not present

## 2021-10-27 DIAGNOSIS — I700 Atherosclerosis of aorta: Secondary | ICD-10-CM | POA: Diagnosis not present

## 2021-10-27 DIAGNOSIS — M545 Low back pain, unspecified: Secondary | ICD-10-CM

## 2021-10-27 DIAGNOSIS — Z8042 Family history of malignant neoplasm of prostate: Secondary | ICD-10-CM | POA: Insufficient documentation

## 2021-10-27 DIAGNOSIS — Z809 Family history of malignant neoplasm, unspecified: Secondary | ICD-10-CM | POA: Insufficient documentation

## 2021-10-27 DIAGNOSIS — Z833 Family history of diabetes mellitus: Secondary | ICD-10-CM | POA: Insufficient documentation

## 2021-10-27 DIAGNOSIS — K760 Fatty (change of) liver, not elsewhere classified: Secondary | ICD-10-CM | POA: Insufficient documentation

## 2021-10-27 DIAGNOSIS — Z803 Family history of malignant neoplasm of breast: Secondary | ICD-10-CM | POA: Insufficient documentation

## 2021-10-27 DIAGNOSIS — Z79899 Other long term (current) drug therapy: Secondary | ICD-10-CM | POA: Insufficient documentation

## 2021-10-27 DIAGNOSIS — E785 Hyperlipidemia, unspecified: Secondary | ICD-10-CM | POA: Insufficient documentation

## 2021-10-27 DIAGNOSIS — E039 Hypothyroidism, unspecified: Secondary | ICD-10-CM | POA: Insufficient documentation

## 2021-10-27 DIAGNOSIS — E119 Type 2 diabetes mellitus without complications: Secondary | ICD-10-CM | POA: Insufficient documentation

## 2021-10-27 LAB — POCT GLYCOSYLATED HEMOGLOBIN (HGB A1C): Hemoglobin A1C: 6.4 % — AB (ref 4.0–5.6)

## 2021-10-27 NOTE — Assessment & Plan Note (Signed)
Chronic, stable.  Continue Cymbalta 60 mg

## 2021-10-27 NOTE — Assessment & Plan Note (Addendum)
Follow-up with Dr. Mariah Milling again April 2023 and Dr Tasia Catchings this month. Will follow

## 2021-10-27 NOTE — Assessment & Plan Note (Signed)
Quiescent at this time. Advised continued compression stockings. Will monitor.

## 2021-10-27 NOTE — Patient Instructions (Signed)
Nice to see you!   

## 2021-10-27 NOTE — Assessment & Plan Note (Signed)
Blood pressure improved as patient rested in exam room.  Continue Norvasc 10 mg ,Hyzaar 50-12.5 mg, Lopressor 25 mg BID

## 2021-10-27 NOTE — Assessment & Plan Note (Signed)
Chronic, symptomatically stable.  Continue Lipitor 20 mg.  Pending lipid panel

## 2021-10-27 NOTE — Assessment & Plan Note (Signed)
Lab Results  Component Value Date   HGBA1C 6.4 (A) 10/27/2021   Excellent control. Continue metformin 500 mg twice daily

## 2021-10-27 NOTE — Progress Notes (Signed)
Subjective:    Patient ID: Cynthia [name], female    DOB: June 27, 1946, 76 y.o.   MRN: [id number]  CC: Cynthia [name] is a 76 y.o. female who presents today for follow up.   HPI: Cynthia [name] feels well today.  No new complaints  HTN- compliant with Norvasc 10 mg ,Hyzaar 50-12.5 mg, Lopressor 25 mg BID. No cp, sob  Leg swelling is well controlled. No bothersome today. Wearing compression stockings with relief.   HLD-compliant with Lipitor 20 mg  Low back pain-compliant with Cymbalta 60 mg. No back pain at this time and feels well on this medication.   DM- compliant with metformin 500 mg twice daily     Left kidney cancer and Cynthia [name] continues to follow with Dr. Tasia Catchings, Dr. Mariah Milling.  Follow-up with Dr. 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 denies   Diabetes mellitus without complication ([place])    type 2   Family history of breast cancer    Family history of lymphoma    Family history of prostate cancer    GERD (gastroesophageal reflux disease)    Hyperlipidemia    Hypertension    Hypothyroidism    Lung nodule    Thyroid disease    Past Surgical History:  Procedure Laterality Date   BREAST BIOPSY Left 11/25/2015   stereo  neg   BREAST EXCISIONAL BIOPSY Left yrs ago   benign   COLONOSCOPY WITH PROPOFOL N/A 04/20/2017   Procedure: COLONOSCOPY WITH PROPOFOL;  Surgeon: [name], MD;  Location[facility] ENDOSCOPY;  Service: Endoscopy;  Laterality: N/A;   COLONOSCOPY WITH PROPOFOL N/A 01/08/2018   Procedure: COLONOSCOPY WITH PROPOFOL;  Surgeon: [name], MD;  Location: [facility] ENDOSCOPY;  Service: Gastroenterology;  Laterality: N/A;   COLONOSCOPY WITH PROPOFOL N/A 08/10/2018   Procedure: COLONOSCOPY WITH PROPOFOL;  Surgeon: [name], MD;  Location: [facility] ENDOSCOPY;  Service:  Gastroenterology;  Laterality: N/A;   COLONOSCOPY WITH PROPOFOL N/A 02/08/2021   Procedure: COLONOSCOPY WITH PROPOFOL;  Surgeon: [name], MD;  Location: [facility] ENDOSCOPY;  Service: Gastroenterology;  Laterality: N/A;   IR RADIOLOGIST EVAL & MGMT  04/08/2020   IR RADIOLOGIST EVAL & MGMT  05/26/2020   IR RADIOLOGIST EVAL & MGMT  09/29/2020   IR RADIOLOGIST EVAL & MGMT  03/10/2021   RADIOLOGY WITH ANESTHESIA N/A 04/29/2020   Procedure: CRYOABLATION;  Surgeon: Arne Cleveland, MD;  Location: [facility] ORS;  Service: Radiology;  Laterality: N/A;   THYROIDECTOMY     Dr. [name]   VAGINAL DELIVERY     4   Family History  Problem Relation Age of Onset   Hypertension Mother    Hypertension Father    Prostate cancer Father 12   Diabetes Sister    Thyroid disease Sister    Breast cancer Sister 11   Breast cancer Sister 64   Lymphoma Sister 70   Breast cancer Maternal Aunt    Breast cancer Maternal Aunt    Cancer Paternal Grandmother        unk type    Allergies: Patient has no known allergies. Current Outpatient Medications on File Prior to Visit  Medication Sig Dispense Refill   albuterol (VENTOLIN HFA) 108 (90 Base) MCG/ACT inhaler Inhale 2 puffs into the  lungs every 6 (six) hours as needed for wheezing or shortness of breath. 8 g 1   amLODipine (NORVASC) 10 MG tablet TAKE 1 TABLET BY MOUTH EVERY DAY 90 tablet 3   aspirin EC 81 MG tablet Take 81 mg by mouth daily. Swallow whole.     atorvastatin (LIPITOR) 20 MG tablet TAKE 1 TABLET BY MOUTH EVERY DAY 90 tablet 0   blood glucose meter kit and supplies KIT Dispense based on patient and insurance preference. Use up to four times daily as directed. 1 each 0   Calcium Carbonate-Vit D-Min (CALCIUM 600+D PLUS MINERALS) 600-400 MG-UNIT TABS Take 1 tablet by mouth daily.      diclofenac sodium (VOLTAREN) 1 % GEL Apply 1 application topically 4 (four) times daily as needed (knee pain.).      DULoxetine (CYMBALTA) 60 MG capsule Take 1 capsule (60 mg total)  by mouth daily. 90 capsule 3   Lancet Devices (ONE TOUCH DELICA LANCING DEV) MISC 1 Device by Does not apply route daily. 1 each 2   Lancets (ONETOUCH DELICA PLUS [id number]) MISC USE AS INSTRUCTED 100 each 12   levothyroxine (SYNTHROID) 137 MCG tablet TAKE 1 TABLET BY MOUTH DAILY BEFORE BREAKFAST. 90 tablet 1   losartan-hydrochlorothiazide (HYZAAR) 50-12.5 MG tablet TAKE 1 TABLET BY MOUTH EVERY DAY 90 tablet 1   metFORMIN (GLUCOPHAGE) 500 MG tablet TAKE 1 TABLET BY MOUTH TWICE A DAY 180 tablet 1   metoprolol tartrate (LOPRESSOR) 25 MG tablet Take 1 tablet (25 mg total) by mouth 2 (two) times daily. 90 tablet 1   ONETOUCH ULTRA test strip TEST BLOOD SUGAR 1-2 TIMES A DAY 100 strip 5   No current facility-administered medications on file prior to visit.    Social History   Tobacco Use   Smoking status: Former    Packs/day: 0.75    Years: 48.00    Pack years: 36.00    Types: Cigarettes    Quit date: 2015    Years since quitting: 8.1   Smokeless tobacco: Former    Types: Snuff, Chew  Vaping Use   Vaping Use: Never used  Substance Use Topics   Alcohol use: No   Drug use: No    Review of Systems  Constitutional:  Negative for chills and fever.  Respiratory:  Negative for cough and shortness of breath.   Cardiovascular:  Negative for chest pain, palpitations and leg swelling.  Gastrointestinal:  Negative for nausea and vomiting.     Objective:    BP 110/60    Pulse 89    Temp 98.9 F (37.2 C) (Oral)    Ht _0  (1.753 m)    Wt 229 lb 9.6 oz (104.1 kg)    SpO2 96%    BMI 33.91 kg/m  BP Readings from Last 3 Encounters:  10/27/21 110/60  05/05/21 110/68  04/27/21 124/79   Wt Readings from Last 3 Encounters:  10/27/21 229 lb 9.6 oz (104.1 kg)  05/05/21 231 lb (104.8 kg)  04/27/21 232 lb 12.8 oz (105.6 kg)    Physical Exam Vitals reviewed.  Constitutional:      Appearance: Cynthia [name] is well-developed.  Eyes:     Conjunctiva/sclera: Conjunctivae normal.  Cardiovascular:      Rate and Rhythm: Normal rate and regular rhythm.     Pulses: Normal pulses.     Heart sounds: Normal heart sounds.     Comments: No LE edema, palpable cords or masses. No erythema or increased warmth. No asymmetry in calf  size when compared bilaterally LE hair growth symmetric and present. No discoloration or varicosities noted. LE warm and palpable pedal pulses.  Pulmonary:     Effort: Pulmonary effort is normal.     Breath sounds: Normal breath sounds. No wheezing, rhonchi or rales.  Skin:    General: Skin is warm and dry.  Neurological:     Mental Status: Cynthia [name] is alert.  Psychiatric:        Speech: Speech normal.        Behavior: Behavior normal.        Thought Content: Thought content normal.       Assessment & Plan:   Problem List Items Addressed This Visit       Cardiovascular and Mediastinum   Atherosclerosis of aorta (HCC)    Chronic, symptomatically stable.  Continue Lipitor 20 mg.  Pending lipid panel      Hypertension    Blood pressure improved as patient rested in exam room.  Continue Norvasc 10 mg ,Hyzaar 50-12.5 mg, Lopressor 25 mg BID        Endocrine   Type 2 diabetes mellitus with diabetic neuropathy, unspecified ([place]) - Primary    Lab Results  Component Value Date   HGBA1C 6.4 (A) 10/27/2021  Excellent control. Continue metformin 500 mg twice daily      Relevant Orders   POCT HgB A1C (Completed)   Lipid panel   Comprehensive metabolic panel   CBC with Differential/Platelet   TSH   Microalbumin / creatinine urine ratio     Genitourinary   Renal carcinoma, left [facility])    Follow-up with Dr. Mariah Milling again April 2023 and Dr Tasia Catchings this month. Will follow        Other   Leg swelling    Quiescent at this time. Advised continued compression stockings. Will monitor.       Low back pain    Chronic, stable.  Continue Cymbalta 60 mg      Other Visit Diagnoses     Need for immunization against influenza       Relevant Orders   Flu Vaccine QUAD  High Dose(Fluad) (Completed)        I am having Janah A. Territo maintain Cynthia [name] Calcium 600+D Plus Minerals, diclofenac sodium, metoprolol tartrate, aspirin EC, albuterol, DULoxetine, OneTouch Delica Plus [id number], blood glucose meter kit and supplies, amLODipine, metFORMIN, levothyroxine, losartan-hydrochlorothiazide, ONE TOUCH DELICA LANCING DEV, atorvastatin, and OneTouch Ultra.   No orders of the defined types were placed in this encounter.   Return precautions given.   Risks, benefits, and alternatives of the medications and treatment plan prescribed today were discussed, and patient expressed understanding.   Education regarding symptom management and diagnosis given to patient on AVS.  Continue to follow with Burnard Hawthorne, FNP for routine health maintenance.   Cynthia [name] and I agreed with plan.   Mable Paris, FNP

## 2021-11-03 ENCOUNTER — Encounter: Payer: Self-pay | Admitting: Oncology

## 2021-11-03 ENCOUNTER — Inpatient Hospital Stay: Payer: Medicare Other

## 2021-11-03 ENCOUNTER — Other Ambulatory Visit: Payer: Self-pay

## 2021-11-03 ENCOUNTER — Ambulatory Visit: Payer: Medicare Other

## 2021-11-03 ENCOUNTER — Inpatient Hospital Stay (HOSPITAL_BASED_OUTPATIENT_CLINIC_OR_DEPARTMENT_OTHER): Payer: Medicare Other | Admitting: Oncology

## 2021-11-03 VITALS — BP 118/78 | HR 77 | Temp 97.0°F | Wt 230.3 lb

## 2021-11-03 DIAGNOSIS — C7A8 Other malignant neuroendocrine tumors: Secondary | ICD-10-CM | POA: Diagnosis not present

## 2021-11-03 DIAGNOSIS — E039 Hypothyroidism, unspecified: Secondary | ICD-10-CM | POA: Diagnosis not present

## 2021-11-03 DIAGNOSIS — Z833 Family history of diabetes mellitus: Secondary | ICD-10-CM | POA: Diagnosis not present

## 2021-11-03 DIAGNOSIS — N289 Disorder of kidney and ureter, unspecified: Secondary | ICD-10-CM | POA: Diagnosis not present

## 2021-11-03 DIAGNOSIS — I10 Essential (primary) hypertension: Secondary | ICD-10-CM | POA: Diagnosis not present

## 2021-11-03 DIAGNOSIS — Z8349 Family history of other endocrine, nutritional and metabolic diseases: Secondary | ICD-10-CM | POA: Diagnosis not present

## 2021-11-03 DIAGNOSIS — K760 Fatty (change of) liver, not elsewhere classified: Secondary | ICD-10-CM | POA: Diagnosis not present

## 2021-11-03 DIAGNOSIS — Z809 Family history of malignant neoplasm, unspecified: Secondary | ICD-10-CM | POA: Diagnosis not present

## 2021-11-03 DIAGNOSIS — E119 Type 2 diabetes mellitus without complications: Secondary | ICD-10-CM | POA: Diagnosis not present

## 2021-11-03 DIAGNOSIS — E785 Hyperlipidemia, unspecified: Secondary | ICD-10-CM | POA: Diagnosis not present

## 2021-11-03 DIAGNOSIS — Z803 Family history of malignant neoplasm of breast: Secondary | ICD-10-CM | POA: Diagnosis not present

## 2021-11-03 DIAGNOSIS — Z8042 Family history of malignant neoplasm of prostate: Secondary | ICD-10-CM | POA: Diagnosis not present

## 2021-11-03 DIAGNOSIS — Z79899 Other long term (current) drug therapy: Secondary | ICD-10-CM | POA: Diagnosis not present

## 2021-11-03 DIAGNOSIS — Z87891 Personal history of nicotine dependence: Secondary | ICD-10-CM | POA: Diagnosis not present

## 2021-11-03 DIAGNOSIS — Z8249 Family history of ischemic heart disease and other diseases of the circulatory system: Secondary | ICD-10-CM | POA: Diagnosis not present

## 2021-11-03 LAB — COMPREHENSIVE METABOLIC PANEL
ALT: 19 U/L (ref 0–44)
AST: 25 U/L (ref 15–41)
Albumin: 3.9 g/dL (ref 3.5–5.0)
Alkaline Phosphatase: 72 U/L (ref 38–126)
Anion gap: 7 (ref 5–15)
BUN: 10 mg/dL (ref 8–23)
CO2: 28 mmol/L (ref 22–32)
Calcium: 9.5 mg/dL (ref 8.9–10.3)
Chloride: 99 mmol/L (ref 98–111)
Creatinine, Ser: 0.61 mg/dL (ref 0.44–1.00)
GFR, Estimated: >60 mL/min (ref >60)
Glucose, Bld: 117 mg/dL — ABNORMAL HIGH (ref 70–99)
Potassium: 3.5 mmol/L (ref 3.5–5.1)
Sodium: 134 mmol/L — ABNORMAL LOW (ref 135–145)
Total Bilirubin: 0.1 mg/dL — ABNORMAL LOW (ref 0.3–1.2)
Total Protein: 7.8 g/dL (ref 6.5–8.1)

## 2021-11-03 LAB — CBC WITH DIFFERENTIAL/PLATELET
Abs Immature Granulocytes: 0.03 10*3/uL (ref 0.00–0.07)
Basophils Absolute: 0.1 10*3/uL (ref 0.0–0.1)
Basophils Relative: 1 %
Eosinophils Absolute: 0.2 10*3/uL (ref 0.0–0.5)
Eosinophils Relative: 4 %
HCT: 41.6 % (ref 36.0–46.0)
Hemoglobin: 14 g/dL (ref 12.0–15.0)
Immature Granulocytes: 1 %
Lymphocytes Relative: 34 %
Lymphs Abs: 1.8 10*3/uL (ref 0.7–4.0)
MCH: 31.1 pg (ref 26.0–34.0)
MCHC: 33.7 g/dL (ref 30.0–36.0)
MCV: 92.4 fL (ref 80.0–100.0)
Monocytes Absolute: 1 10*3/uL (ref 0.1–1.0)
Monocytes Relative: 17 %
Neutro Abs: 2.3 10*3/uL (ref 1.7–7.7)
Neutrophils Relative %: 43 %
Platelets: 334 10*3/uL (ref 150–400)
RBC: 4.5 MIL/uL (ref 3.87–5.11)
RDW: 13.1 % (ref 11.5–15.5)
WBC: 5.5 10*3/uL (ref 4.0–10.5)
nRBC: 0 % (ref 0.0–0.2)

## 2021-11-03 LAB — LACTATE DEHYDROGENASE: LDH: 144 U/L (ref 98–192)

## 2021-11-03 NOTE — Progress Notes (Signed)
Hematology/Oncology Progress note [phone] [phone]      Patient Care Team: Burnard Hawthorne, FNP as PCP - General (Family Medicine) Jackolyn Confer, MD (Internal Medicine) Clent Jacks, RN as Registered Nurse Earlie Server, MD as Consulting Physician (Hematology and Oncology)  REFERRING PROVIDER: Gastroenterology: Napoleon Form. 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 All three ascending colon polyps were tubular adenoma. 5 sigmoid colon polyps were hyperplastic polyps. One polyp turned out to be well defferentiated neuroendocrine tumor, 2.1m, grade 1.   The tumor was small and the tattoo of the site of tumor removal is not feasible per GI.  Case was discussed on tumor board and recommend surveillance.  02/09/2018 dotatate PET scan was negative Patient had colonoscopy surveillance   # S/p repeat colonoscopy on 08/10/2018. Polyps resected. Pathology negative malignancy.  # She recently had a CT chest lung cancer screening.  With incidental findings of low attenuation lesion of the left kidney.  03/02/2020 ultrasound kidney showed 2.7 cm solid-appearing lesion of the left kidney which is suspicious for renal cell carcinoma. 03/09/2020 MRI abdomen with and without contrast showed complex cystic lesion arising from the upper pole of the left kidney measuring 2.1 cm.  Compatible with cystic renal cell carcinoma.  She also has right kidney lesion. Mild hepatic steatosis  # 04/29/2020 s/p image guided left renal mass (T1 lesion) cryoablation by Dr.Hassell.  # 09/25/20 MRI abdomen with and  without contrast showed post ablation changes in the left kidney without evidence of residual at the site of ablation.  Nodular area seen in the right lung base is more focal seen the absence of significant atelectasis measuring approximately 142m.   INTERVAL HISTORY Cynthia HALSETHs a [age].o. 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 Patient reports feeling well.  No new complaints.  Denies any unintentional weight loss, nausea vomiting diarrhea, abdominal pain.  Review of Systems  Constitutional:  Negative for chills, fever, malaise/fatigue and weight loss.  HENT:  Negative for sore throat.   Eyes:  Negative for redness.  Respiratory:  Negative for cough, shortness of breath and wheezing.   Cardiovascular:  Negative for chest pain, palpitations and leg swelling.  Gastrointestinal:  Negative for abdominal pain, blood in stool, nausea and vomiting.  Genitourinary:  Negative for dysuria.  Musculoskeletal:  Negative for joint pain and myalgias.  Skin:  Negative for rash.  Neurological:  Negative for dizziness, tingling and tremors.  Endo/Heme/Allergies:  Does not bruise/bleed easily.  Psychiatric/Behavioral:  Negative for hallucinations.    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 denies   Diabetes mellitus without complication ([facility]   type 2   Family history of breast cancer    Family history of lymphoma    Family history of prostate cancer    GERD (  gastroesophageal reflux disease)    Hyperlipidemia    Hypertension    Hypothyroidism    Lung nodule    Thyroid disease     SURGICAL HISTORY: Past Surgical History:  Procedure Laterality Date   BREAST BIOPSY Left 11/25/2015   stereo  neg   BREAST EXCISIONAL  BIOPSY Left yrs ago   benign   COLONOSCOPY WITH PROPOFOL N/A 04/20/2017   Procedure: COLONOSCOPY WITH PROPOFOL;  Surgeon: [name], MD;  Location: [facility] ENDOSCOPY;  Service: Endoscopy;  Laterality: N/A;   COLONOSCOPY WITH PROPOFOL N/A 01/08/2018   Procedure: COLONOSCOPY WITH PROPOFOL;  Surgeon: [name], MD;  Location: [facility] ENDOSCOPY;  Service: Gastroenterology;  Laterality: N/A;   COLONOSCOPY WITH PROPOFOL N/A 08/10/2018   Procedure: COLONOSCOPY WITH PROPOFOL;  Surgeon: [name], MD;  Location: [facility] ENDOSCOPY;  Service: Gastroenterology;  Laterality: N/A;   COLONOSCOPY WITH PROPOFOL N/A 02/08/2021   Procedure: COLONOSCOPY WITH PROPOFOL;  Surgeon: [name], MD;  Location: [facility] ENDOSCOPY;  Service: Gastroenterology;  Laterality: N/A;   IR RADIOLOGIST EVAL & MGMT  04/08/2020   IR RADIOLOGIST EVAL & MGMT  05/26/2020   IR RADIOLOGIST EVAL & MGMT  09/29/2020   IR RADIOLOGIST EVAL & MGMT  03/10/2021   RADIOLOGY WITH ANESTHESIA N/A 04/29/2020   Procedure: CRYOABLATION;  Surgeon: Arne Cleveland, MD;  Location: [facility] ORS;  Service: Radiology;  Laterality: N/A;   THYROIDECTOMY     Dr. [name]   VAGINAL DELIVERY     4    SOCIAL HISTORY: Social History   Socioeconomic History   Marital status: Married    Spouse name: Not on file   Number of children: 4   Years of education: Not on file   Highest education level: Not on file  Occupational History   Not on file  Tobacco Use   Smoking status: Former    Packs/day: 0.75    Years: 48.00    Pack years: 36.00    Types: Cigarettes    Quit date: 2015    Years since quitting: 8.1   Smokeless tobacco: Former    Types: Snuff, Chew  Vaping Use   Vaping Use: Never used  Substance and Sexual Activity   Alcohol use: No   Drug use: No   Sexual activity: Not Currently  Other Topics Concern   Not on file  Social History Narrative   Lives in [place] with husband. Has 4 children.      4 grandchildren.      Work - Retired from [employer]- walking the track, 4x per week      Diet- regular      Social Determinants of Radio broadcast assistant Strain: Not on file  Food Insecurity: Not on file  Transportation Needs: Not on file  Physical Activity: Not on file  Stress: Not on file  Social Connections: Not on file  Intimate Partner Violence: Not on file    FAMILY HISTORY: Family History  Problem Relation Age of Onset   Hypertension Mother    Hypertension Father    Prostate cancer Father 65   Diabetes Sister    Thyroid disease Sister    Breast cancer Sister 55   Breast cancer Sister 68   Lymphoma Sister 51   Breast cancer Maternal Aunt    Breast cancer Maternal Aunt    Cancer Paternal Grandmother        unk type    ALLERGIES:  has No Known Allergies.  MEDICATIONS:  Current Outpatient Medications  Medication Sig Dispense Refill   albuterol (VENTOLIN HFA) 108 (90 Base) MCG/ACT inhaler Inhale 2 puffs into the lungs every 6 (six) hours as needed for wheezing or shortness of breath. 8 g 1   amLODipine (NORVASC) 10 MG tablet TAKE 1 TABLET BY MOUTH EVERY DAY 90 tablet 3   aspirin EC 81 MG tablet Take 81 mg by mouth daily. Swallow whole.     atorvastatin (LIPITOR) 20 MG tablet TAKE 1 TABLET BY MOUTH EVERY DAY 90 tablet 0   blood glucose meter kit and supplies KIT Dispense based on patient and insurance preference. Use up to four times daily as directed. 1 each 0   Calcium Carbonate-Vit D-Min (CALCIUM 600+D PLUS MINERALS) 600-400 MG-UNIT TABS Take 1 tablet by mouth daily.      diclofenac sodium (VOLTAREN) 1 % GEL Apply 1 application topically 4 (four) times daily as needed (knee pain.).      DULoxetine (CYMBALTA) 60 MG capsule Take 1 capsule (60 mg total) by mouth daily. 90 capsule 3   Lancet Devices (ONE TOUCH DELICA LANCING DEV) MISC 1 Device by Does not apply route daily. 1 each 2   Lancets (ONETOUCH DELICA PLUS [id number]) MISC USE AS INSTRUCTED 100 each 12   levothyroxine (SYNTHROID) 137 MCG tablet  TAKE 1 TABLET BY MOUTH DAILY BEFORE BREAKFAST. 90 tablet 1   losartan-hydrochlorothiazide (HYZAAR) 50-12.5 MG tablet TAKE 1 TABLET BY MOUTH EVERY DAY 90 tablet 1   metFORMIN (GLUCOPHAGE) 500 MG tablet TAKE 1 TABLET BY MOUTH TWICE A DAY 180 tablet 1   metoprolol tartrate (LOPRESSOR) 25 MG tablet Take 1 tablet (25 mg total) by mouth 2 (two) times daily. 90 tablet 1   ONETOUCH ULTRA test strip TEST BLOOD SUGAR 1-2 TIMES A DAY 100 strip 5   No current facility-administered medications for this visit.     PHYSICAL EXAMINATION: ECOG PERFORMANCE STATUS: 0 - Asymptomatic Vitals:   11/03/21 1011  BP: 118/78  Pulse: 77  Temp: (!) 97 F (36.1 C)   Filed Weights   11/03/21 1011  Weight: 230 lb 4.8 oz (104.5 kg)    Physical Exam Constitutional:      General: She is not in acute distress.    Appearance: She is well-developed. She is obese.  HENT:     Head: Normocephalic and atraumatic.  Eyes:     General: No scleral icterus.    Pupils: Pupils are equal, round, and reactive to light.  Cardiovascular:     Rate and Rhythm: Normal rate and regular rhythm.     Heart sounds: Normal heart sounds.  Pulmonary:     Effort: Pulmonary effort is normal. No respiratory distress.     Breath sounds: Normal breath sounds. No wheezing.  Abdominal:     General: Bowel sounds are normal. There is no distension.     Palpations: Abdomen is soft.     Tenderness: There is no abdominal tenderness.  Musculoskeletal:        General: No deformity. Normal range of motion.     Cervical back: Normal range of motion and neck supple.  Lymphadenopathy:     Cervical: No cervical adenopathy.  Skin:    General: Skin is warm and dry.     Findings: No erythema or rash.  Neurological:     Mental Status: She is alert and oriented to person, place, and time. Mental status is at baseline.     Cranial Nerves: No cranial nerve deficit.  Coordination: Coordination normal.  Psychiatric:        Mood and Affect: Mood  normal.     LABORATORY DATA:  I have reviewed the data as listed Lab Results  Component Value Date   WBC 5.5 11/03/2021   HGB 14.0 11/03/2021   HCT 41.6 11/03/2021   MCV 92.4 11/03/2021   PLT 334 11/03/2021   Recent Labs    12/31/20 0835 04/14/21 1128 11/03/21 1042  NA 140 133* 134*  K 4.0 3.5 3.5  CL 100 102 99  CO2 _0 GLUCOSE 154* 89 117*  BUN _1 CREATININE 0.75 0.61 0.61  CALCIUM 9.9 8.9 9.5  GFRNONAA  --  >60 >60  PROT  --  7.1 7.8  ALBUMIN  --  4.1 3.9  AST  --  26 25  ALT  --  22 19  ALKPHOS  --  69 72  BILITOT  --  0.5 0.1*     Serum serotonin level at 111 within normal range 5 HIAA urine test was obtained and patient has not gotten done yet.   ASSESSMENT & PLAN:  1. Neuroendocrine carcinoma of colon ([place])   2. Kidney lesion   3. 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 Recommend patient to continue annual surveillance.  She has had MRI abdomen done in June 2022. 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  S/p cryoablation.  03/08/2021 MRI abdomen with and without contrast showed post ablation changes in the left kidney without evidence of residual or recurrent disease at the site of ablation. Repeat MRI abdomen was ordered by IR and has not been scheduled yet.  I asked patient to call Dr. Vernard Gambles for follow-up plan. . Former smoker, refer to lung cancer screening program.  11/10/2020 CT chest without contrast showed unchanged pulmonary nodules.  Continue annual CT lung cancer screening.  Family history of breast cancer.  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   Patient was evaluated by genetic counselor.  Genetic testing showed no pathological mutations.  Follow-up in 6 months   all questions were answered. The patient knows to call the clinic with any problems questions or concerns.   Cc Dr.Hassell Earlie Server, MD,  PhD Hematology Oncology  11/03/2021

## 2021-11-04 ENCOUNTER — Encounter: Payer: Self-pay | Admitting: Podiatry

## 2021-11-04 ENCOUNTER — Other Ambulatory Visit: Payer: Self-pay

## 2021-11-04 ENCOUNTER — Ambulatory Visit: Payer: Medicare Other | Admitting: Podiatry

## 2021-11-04 ENCOUNTER — Telehealth: Payer: Self-pay

## 2021-11-04 DIAGNOSIS — C7A8 Other malignant neuroendocrine tumors: Secondary | ICD-10-CM

## 2021-11-04 DIAGNOSIS — B351 Tinea unguium: Secondary | ICD-10-CM

## 2021-11-04 DIAGNOSIS — Z87891 Personal history of nicotine dependence: Secondary | ICD-10-CM

## 2021-11-04 DIAGNOSIS — M79674 Pain in right toe(s): Secondary | ICD-10-CM

## 2021-11-04 DIAGNOSIS — M79675 Pain in left toe(s): Secondary | ICD-10-CM

## 2021-11-04 DIAGNOSIS — E1140 Type 2 diabetes mellitus with diabetic neuropathy, unspecified: Secondary | ICD-10-CM

## 2021-11-04 LAB — CHROMOGRANIN A: Chromogranin A (ng/mL): 32.6 ng/mL (ref 0.0–101.8)

## 2021-11-04 NOTE — Progress Notes (Signed)

## 2021-11-04 NOTE — Telephone Encounter (Signed)
Referral for LCSP entered.

## 2021-11-07 DIAGNOSIS — Z809 Family history of malignant neoplasm, unspecified: Secondary | ICD-10-CM | POA: Diagnosis not present

## 2021-11-07 DIAGNOSIS — Z87891 Personal history of nicotine dependence: Secondary | ICD-10-CM | POA: Diagnosis not present

## 2021-11-07 DIAGNOSIS — C7A8 Other malignant neuroendocrine tumors: Secondary | ICD-10-CM | POA: Diagnosis not present

## 2021-11-07 DIAGNOSIS — Z8349 Family history of other endocrine, nutritional and metabolic diseases: Secondary | ICD-10-CM | POA: Diagnosis not present

## 2021-11-07 DIAGNOSIS — I10 Essential (primary) hypertension: Secondary | ICD-10-CM | POA: Diagnosis not present

## 2021-11-07 DIAGNOSIS — Z803 Family history of malignant neoplasm of breast: Secondary | ICD-10-CM | POA: Diagnosis not present

## 2021-11-07 DIAGNOSIS — Z833 Family history of diabetes mellitus: Secondary | ICD-10-CM | POA: Diagnosis not present

## 2021-11-07 DIAGNOSIS — Z8249 Family history of ischemic heart disease and other diseases of the circulatory system: Secondary | ICD-10-CM | POA: Diagnosis not present

## 2021-11-07 DIAGNOSIS — Z79899 Other long term (current) drug therapy: Secondary | ICD-10-CM | POA: Diagnosis not present

## 2021-11-07 DIAGNOSIS — E039 Hypothyroidism, unspecified: Secondary | ICD-10-CM | POA: Diagnosis not present

## 2021-11-07 DIAGNOSIS — K760 Fatty (change of) liver, not elsewhere classified: Secondary | ICD-10-CM | POA: Diagnosis not present

## 2021-11-07 DIAGNOSIS — E119 Type 2 diabetes mellitus without complications: Secondary | ICD-10-CM | POA: Diagnosis not present

## 2021-11-07 DIAGNOSIS — E785 Hyperlipidemia, unspecified: Secondary | ICD-10-CM | POA: Diagnosis not present

## 2021-11-08 ENCOUNTER — Other Ambulatory Visit: Payer: Self-pay

## 2021-11-08 DIAGNOSIS — C7A8 Other malignant neuroendocrine tumors: Secondary | ICD-10-CM

## 2021-11-10 LAB — 5 HIAA, QUANTITATIVE, URINE, 24 HOUR
5-HIAA, Ur: 1.8 mg/L
5-HIAA,Quant.,24 Hr Urine: 5.2 mg/24 hr (ref 0.0–14.9)
Total Volume: 2900

## 2021-11-11 DIAGNOSIS — K219 Gastro-esophageal reflux disease without esophagitis: Secondary | ICD-10-CM | POA: Diagnosis not present

## 2021-11-11 DIAGNOSIS — E663 Overweight: Secondary | ICD-10-CM | POA: Insufficient documentation

## 2021-11-11 DIAGNOSIS — E1122 Type 2 diabetes mellitus with diabetic chronic kidney disease: Secondary | ICD-10-CM | POA: Diagnosis not present

## 2021-11-11 DIAGNOSIS — I10 Essential (primary) hypertension: Secondary | ICD-10-CM | POA: Diagnosis not present

## 2021-11-11 DIAGNOSIS — R609 Edema, unspecified: Secondary | ICD-10-CM | POA: Insufficient documentation

## 2021-11-11 DIAGNOSIS — N1831 Chronic kidney disease, stage 3a: Secondary | ICD-10-CM | POA: Insufficient documentation

## 2021-11-11 DIAGNOSIS — C642 Malignant neoplasm of left kidney, except renal pelvis: Secondary | ICD-10-CM | POA: Diagnosis not present

## 2021-11-11 DIAGNOSIS — E871 Hypo-osmolality and hyponatremia: Secondary | ICD-10-CM | POA: Diagnosis not present

## 2021-11-11 DIAGNOSIS — R809 Proteinuria, unspecified: Secondary | ICD-10-CM | POA: Diagnosis not present

## 2021-11-11 DIAGNOSIS — J449 Chronic obstructive pulmonary disease, unspecified: Secondary | ICD-10-CM | POA: Diagnosis not present

## 2021-11-15 ENCOUNTER — Other Ambulatory Visit: Payer: Self-pay | Admitting: Family

## 2021-11-15 DIAGNOSIS — E039 Hypothyroidism, unspecified: Secondary | ICD-10-CM

## 2021-11-18 ENCOUNTER — Telehealth: Payer: Self-pay | Admitting: Family

## 2021-11-18 NOTE — Telephone Encounter (Signed)
Pt called in requesting for refill on medication (albuterol (VENTOLIN HFA) 108 (90 Base) MCG/ACT inhaler). Pt requesting callback  ?

## 2021-11-18 NOTE — Telephone Encounter (Signed)
Patient called about her medication refill. ?

## 2021-11-19 ENCOUNTER — Other Ambulatory Visit: Payer: Medicare Other

## 2021-11-19 ENCOUNTER — Other Ambulatory Visit: Payer: Self-pay

## 2021-11-19 DIAGNOSIS — J441 Chronic obstructive pulmonary disease with (acute) exacerbation: Secondary | ICD-10-CM

## 2021-11-19 MED ORDER — ALBUTEROL SULFATE HFA 108 (90 BASE) MCG/ACT IN AERS: 2.0000 | INHALATION_SPRAY | Freq: Four times a day (QID) | RESPIRATORY_TRACT | 1 refills | Status: DC | PRN

## 2021-11-19 NOTE — Telephone Encounter (Signed)
I spoke with patient & she stated that she had picked up a virus the last few days. She has been having diarrhea & vomiting. This since has ceased and patient feeling better today. She said that she has been drinking lots of juice & eating chicken noodle soup. She denied any SOB but she said that she did have congestion & some coughing. She declined the 12p appointment for today. I advised if she needed anything or symptoms worsened over the weekend that she should seek out UC but if she decided that she wanted 12p appointment to call us back.  ?

## 2021-11-20 ENCOUNTER — Other Ambulatory Visit: Payer: Self-pay | Admitting: Family

## 2021-11-20 DIAGNOSIS — E785 Hyperlipidemia, unspecified: Secondary | ICD-10-CM

## 2021-11-23 ENCOUNTER — Other Ambulatory Visit: Payer: Self-pay

## 2021-11-23 ENCOUNTER — Ambulatory Visit
Admission: RE | Admit: 2021-11-23 | Discharge: 2021-11-23 | Disposition: A | Discharge status: Home / Self Care | Payer: Medicare Other | Source: Ambulatory Visit | Attending: Interventional Radiology | Admitting: Interventional Radiology

## 2021-11-23 DIAGNOSIS — N2889 Other specified disorders of kidney and ureter: Secondary | ICD-10-CM | POA: Diagnosis not present

## 2021-11-23 DIAGNOSIS — N281 Cyst of kidney, acquired: Secondary | ICD-10-CM | POA: Diagnosis not present

## 2021-11-23 IMAGING — MR MR ABDOMEN WO/W CM
18 series · 48 of 48 positions shown · IV contrast (gadavist)
Comparison: MRI [DATE]

CLINICAL DATA: Status post cryoablation of LEFT renal mass. Routine
annual surveillance.

EXAM:
MRI ABDOMEN WITHOUT AND WITH CONTRAST
TECHNIQUE: Multiplanar multisequence MR imaging of the abdomen was performed
both before and after the administration of intravenous contrast.
CONTRAST:  10mL GADAVIST GADOBUTROL 1 MMOL/ML IV SOLN

[Series 3: T2 · coronal · 6.0mm · 1.19mm/px · 2 of 30 slices shown (1 of 2)]
[im 1/30]
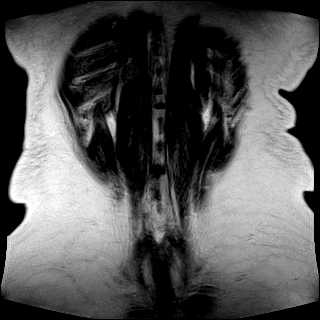
[im 30/30]
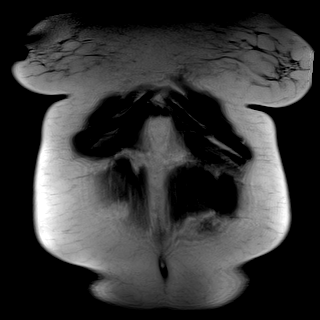

[Series 4: T2 · axial · 6.0mm · 1.19mm/px · z∈[-135,+103]mm · 2 of 34 slices shown (2 of 2)]
[im 1/34]
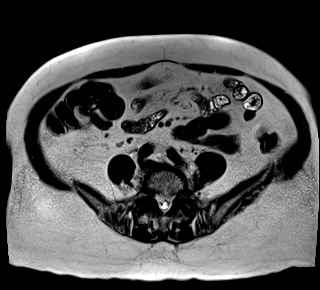
[im 34/34]
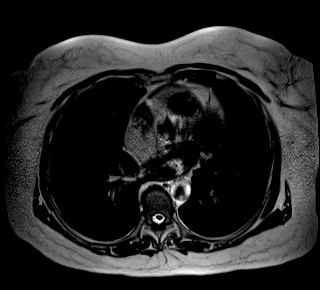

[Series 5: T1 · axial · 3.0mm · 1.19mm/px · z∈[-122,+91]mm · 4 of 72 slices shown (1 of 2)]
[im 1/72]
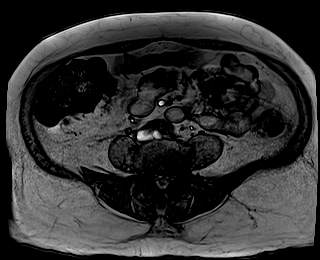
[im 24/72]
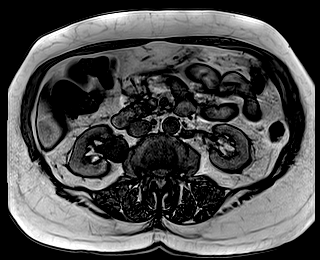
[im 48/72]
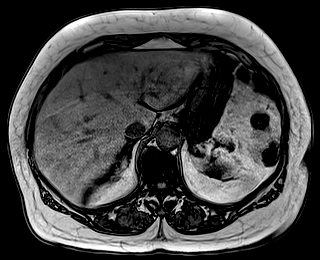
[im 72/72]
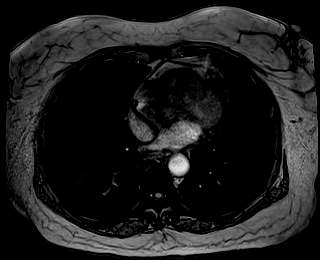

[Series 6: T1 · axial · 3.0mm · 1.19mm/px · z∈[-122,+91]mm · 3 of 72 slices shown (2 of 2)]
[im 1/72]
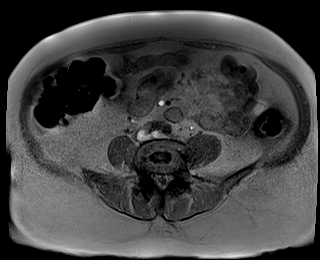
[im 36/72]
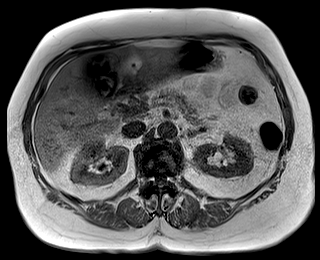
[im 72/72]
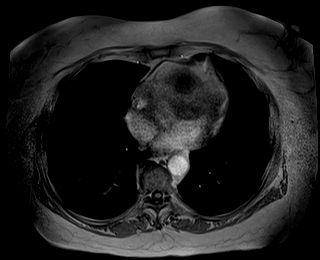

[Series 7: bSSFP · axial · 6.0mm · 0.74mm/px · 1 of 34 slices shown]
[im 1/34]
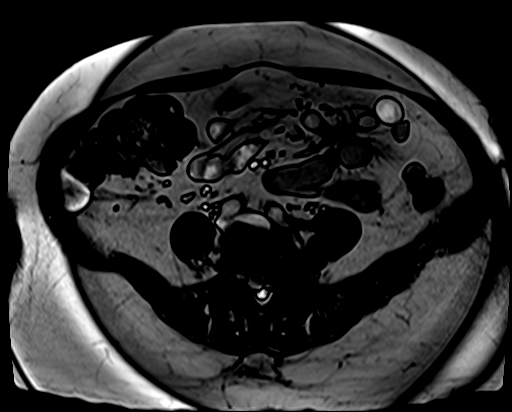

[Series 8: ax dwi_tracew · axial · 6.0mm · 1.42mm/px · z∈[-120,+89]mm · 4 of 90 slices shown]
[im 1/90]
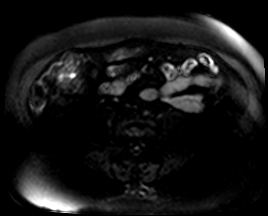
[im 30/90]
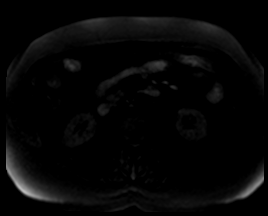
[im 60/90]
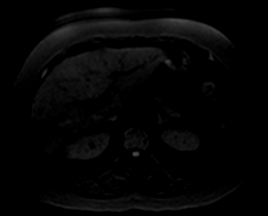
[im 90/90]
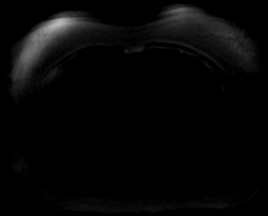

[Series 9: ax dwi_adc · axial · 6.0mm · 1.42mm/px · 1 of 30 slices shown]
[im 1/30]
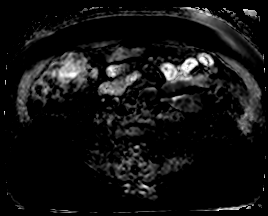

[Series 10: T1 dynamic fat-sat · axial · non-contrast · 3.0mm · 1.19mm/px · z∈[-122,+91]mm · 3 of 72 slices shown (1 of 5)]
[im 1/72]
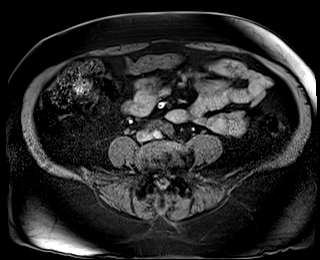
[im 36/72]
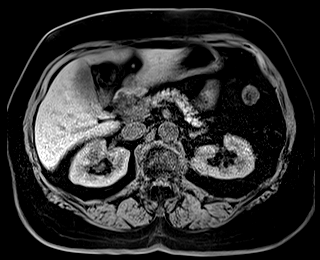
[im 72/72]
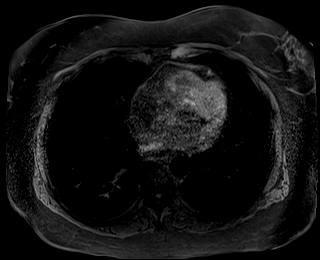

[Series 13: T2 fat-sat · axial · 6.0mm · 1.19mm/px · 1 of 30 slices shown]
[im 1/30]
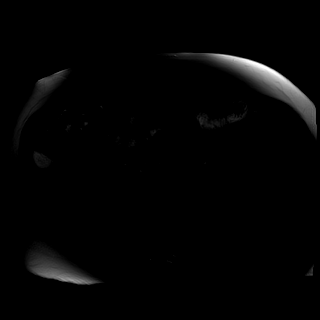

[Series 14: T1 dynamic fat-sat post-contrast · axial · 3.0mm · 1.19mm/px · z∈[-122,+91]mm · 3 of 72 slices shown (1 of 4)]
[im 1/72]
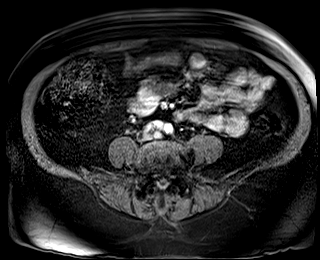
[im 36/72]
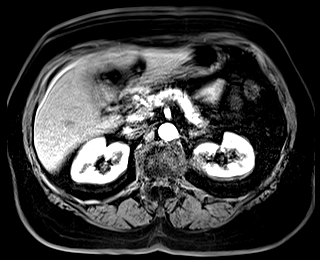
[im 72/72]
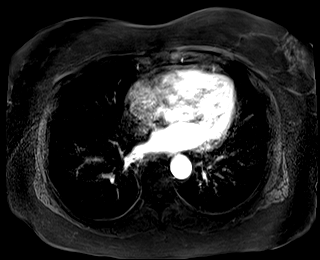

[Series 15: T1 dynamic fat-sat · axial · 3.0mm · 1.19mm/px · z∈[-122,+91]mm · 3 of 72 slices shown (2 of 5)]
[im 1/72]
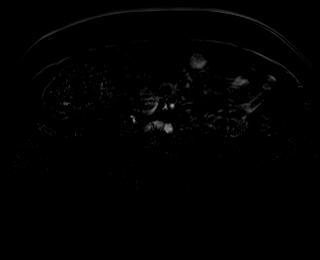
[im 36/72]
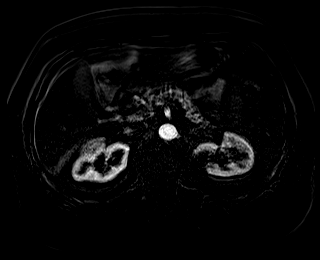
[im 72/72]
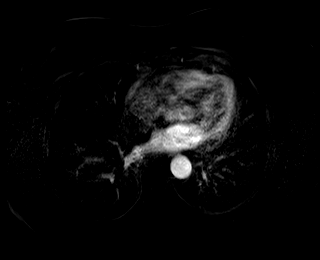

[Series 16: T1 dynamic fat-sat post-contrast · axial · 3.0mm · 1.19mm/px · z∈[-122,+91]mm · 3 of 72 slices shown (2 of 4)]
[im 1/72]
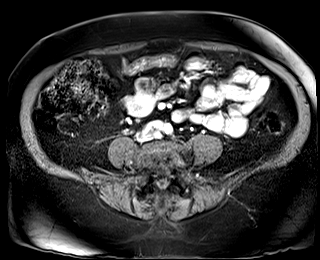
[im 36/72]
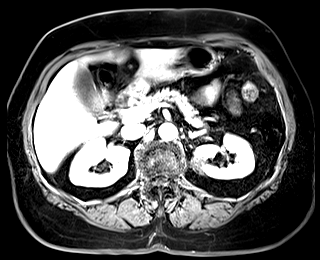
[im 72/72]
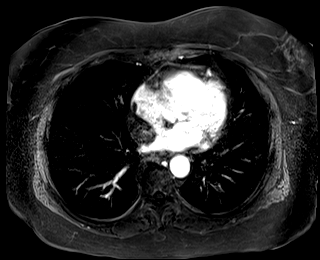

[Series 17: T1 dynamic fat-sat · axial · 3.0mm · 1.19mm/px · z∈[-122,+91]mm · 3 of 72 slices shown (3 of 5)]
[im 1/72]
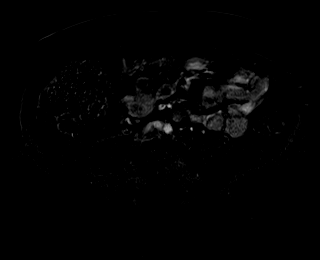
[im 36/72]
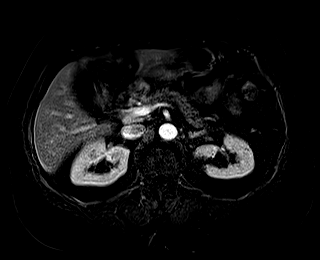
[im 72/72]
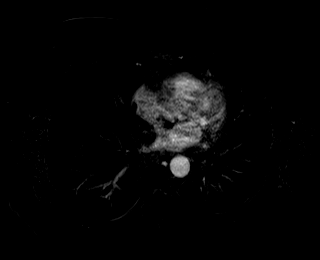

[Series 18: T1 dynamic fat-sat post-contrast · axial · 3.0mm · 1.19mm/px · z∈[-122,+91]mm · 3 of 72 slices shown (3 of 4)]
[im 1/72]
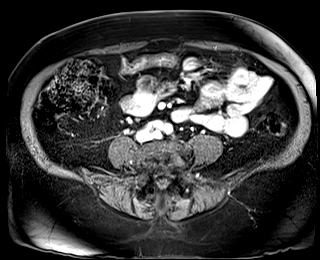
[im 36/72]
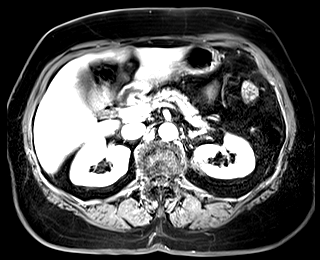
[im 72/72]
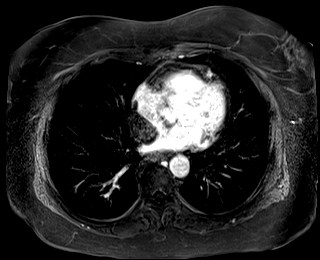

[Series 19: T1 dynamic fat-sat · axial · 3.0mm · 1.19mm/px · z∈[-122,+91]mm · 3 of 72 slices shown (4 of 5)]
[im 1/72]
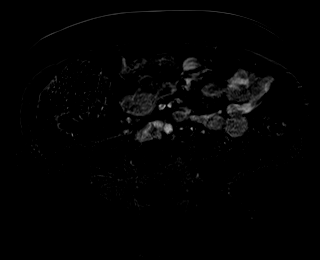
[im 36/72]
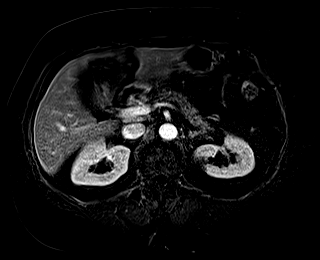
[im 72/72]
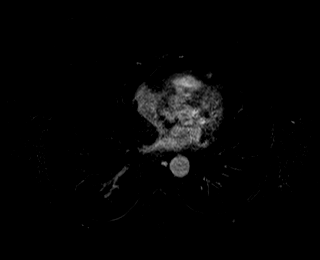

[Series 20: T1 dynamic post-contrast · coronal · 3.0mm · 1.31mm/px · 3 of 72 slices shown]
[im 1/72]
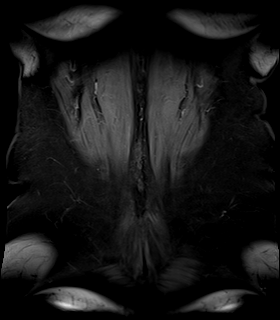
[im 36/72]
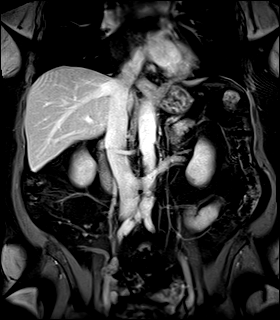
[im 72/72]
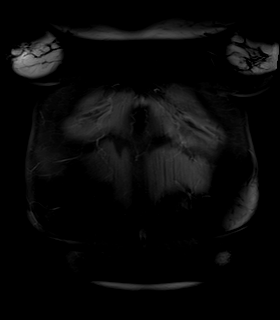

[Series 21: T1 dynamic fat-sat post-contrast · axial · 3.0mm · 1.19mm/px · z∈[-122,+91]mm · 3 of 72 slices shown (4 of 4)]
[im 1/72]
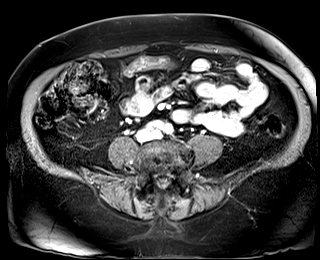
[im 36/72]
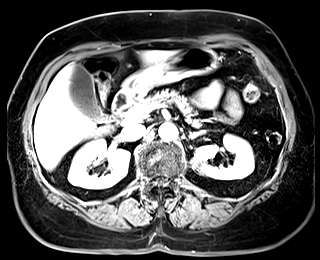
[im 72/72]
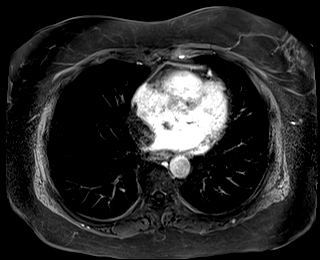

[Series 22: T1 dynamic fat-sat · axial · 3.0mm · 1.19mm/px · z∈[-122,+91]mm · 3 of 72 slices shown (5 of 5)]
[im 1/72]
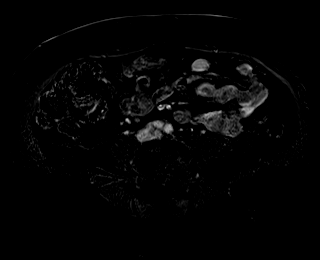
[im 36/72]
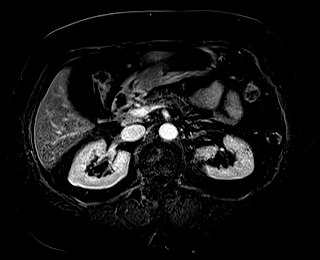
[im 72/72]
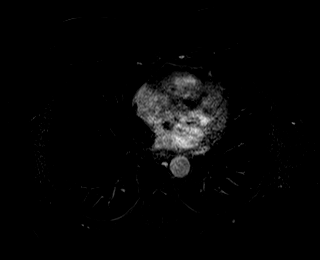

[48 of 48 positions shown; findings below may reference images not displayed]

FINDINGS: Lower chest:  Lung bases are clear.

Hepatobiliary: No focal hepatic lesion. Gallbladder normal. Common
bile duct normal.

Pancreas: Normal pancreatic parenchymal intensity. No ductal
dilatation or inflammation.

Spleen: Normal spleen.

Adrenals/urinary tract: Adrenal glands normal.

Ablation defect again noted the medial margin of the LEFT mid
kidney. Ablation site measures 19 mm x 18 mm in axial dimension
(image 19/series 4) compared to 24 x 24 mm. Lesion is hypointense on
T2 weighted imaging, hyperintense on T1 weighted imaging and
demonstrates no post-contrast enhancement by direct measurement
(series 14) and by subtraction imaging (series 15).

No new enhancing lesion LEFT or RIGHT kidney. Benign nonenhancing
cyst in the medial margin of the RIGHT kidney.

Stomach/Bowel: Stomach and limited of the small bowel is
unremarkable

Vascular/Lymphatic: Abdominal aortic normal caliber. No
retroperitoneal periportal lymphadenopathy.

Musculoskeletal: No aggressive osseous lesion
IMPRESSION: 1. Contraction of the ablation site in the medial LEFT kidney
compared to MRI [DATE]. No significant enhancement or nodularity
present to suggest recurrence.
2. No new lesions in the kidneys.
3. No lymphadenopathy.

## 2021-11-23 MED ORDER — GADOBUTROL 1 MMOL/ML IV SOLN
10.0000 mL | Freq: Once | INTRAVENOUS | Status: AC | PRN
  Administered 2021-11-23: 10 mL via INTRAVENOUS

## 2021-12-01 ENCOUNTER — Other Ambulatory Visit: Payer: Self-pay | Admitting: Family

## 2021-12-01 DIAGNOSIS — N1831 Chronic kidney disease, stage 3a: Secondary | ICD-10-CM | POA: Diagnosis not present

## 2021-12-01 DIAGNOSIS — J449 Chronic obstructive pulmonary disease, unspecified: Secondary | ICD-10-CM | POA: Diagnosis not present

## 2021-12-01 DIAGNOSIS — E782 Mixed hyperlipidemia: Secondary | ICD-10-CM | POA: Diagnosis not present

## 2021-12-01 DIAGNOSIS — I2510 Atherosclerotic heart disease of native coronary artery without angina pectoris: Secondary | ICD-10-CM | POA: Diagnosis not present

## 2021-12-01 DIAGNOSIS — E119 Type 2 diabetes mellitus without complications: Secondary | ICD-10-CM | POA: Diagnosis not present

## 2021-12-01 DIAGNOSIS — K219 Gastro-esophageal reflux disease without esophagitis: Secondary | ICD-10-CM | POA: Diagnosis not present

## 2021-12-01 DIAGNOSIS — I700 Atherosclerosis of aorta: Secondary | ICD-10-CM | POA: Diagnosis not present

## 2021-12-01 DIAGNOSIS — I10 Essential (primary) hypertension: Secondary | ICD-10-CM

## 2021-12-01 DIAGNOSIS — R0609 Other forms of dyspnea: Secondary | ICD-10-CM | POA: Diagnosis not present

## 2021-12-01 DIAGNOSIS — R600 Localized edema: Secondary | ICD-10-CM | POA: Diagnosis not present

## 2021-12-10 ENCOUNTER — Other Ambulatory Visit: Payer: Self-pay

## 2021-12-10 ENCOUNTER — Encounter: Payer: Self-pay | Admitting: *Deleted

## 2021-12-10 ENCOUNTER — Ambulatory Visit
Admission: RE | Admit: 2021-12-10 | Discharge: 2021-12-10 | Disposition: A | Discharge status: Home / Self Care | Payer: Medicare Other | Source: Ambulatory Visit | Attending: Interventional Radiology | Admitting: Interventional Radiology

## 2021-12-10 DIAGNOSIS — N2889 Other specified disorders of kidney and ureter: Secondary | ICD-10-CM | POA: Diagnosis not present

## 2021-12-10 DIAGNOSIS — Z9889 Other specified postprocedural states: Secondary | ICD-10-CM | POA: Diagnosis not present

## 2021-12-10 HISTORY — PX: IR RADIOLOGIST EVAL & MGMT: IMG5224

## 2021-12-10 NOTE — Progress Notes (Addendum)
Patient ID: Cynthia [name], female   DOB: 1946/09/05, 76 y.o.   MRN: [id number] ?    ? ? ?Chief Complaint: ?Patient was consulted remotely today (TeleHealth) for Follow-up left renal cryoablation at the request of Rhone Ozaki.   ? ?Referring Physician(s): ?Marlinda Mike, MD ? ?History of Present Illness: ?Cynthia [name] is a 76 y.o. female With a history of neuroendocrine colon carcinoma and pulmonary nodules. ?02/05/2018 PET/CT showed no residual/recurrent disease related to her colon carcinoma.  Small low-attenuation left renal lesion is in retrospect noted. ?02/19/2020 on a follow-up CT chest study, interval increase in size of left renal mass was identified ?03/02/2020 renal ultrasound confirms solitary 2.7 cm left mid renal mass ?03/09/2020 MR confirms 2.1 cm complex left upper pole renal lesion, partially exophytic.  No regional invasion or adenopathy. ?The patient was asymptomatic from  this lesion.  No renal insufficiency.  No hematuria, flank pain, weight loss.  No history of kidney cancer.  Positive history of tobacco abuse. ?04/29/2020 patient underwent CT-guided core biopsy and cryoablation of the left renal mass.  Surgical  pathology consistent with clear-cell carcinoma.  She did well postprocedure and was discharged home the following day as scheduled.  Minimal discomfort at the treatment site x2 days.  No hematuria. ?09/25/2020 MRI abdomen without and with contrast showed post ablation change with no residual or recurrent tumor.  No regional adenopathy.  Nodular density seen in the right lung base of the lung. ?03/08/2021 MR demonstrates stable post ablation changes, no residual/recurrent disease or evidence of metastatic disease ? ?11/23/2021 she had her most recent follow-up MRI. ?She remains asymptomatic.   ?No symptoms in the left flank in the region of treatment.  No hematuria or flank pain. ? ?Past Medical History:  ?Diagnosis Date  ? Arthritis   ? Cancer [facility]) 2019  ? 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 COPD (chronic obstructive pulmonary disease) ([place])   ? Coronary artery disease   ? pt. denies  ? Diabetes mellitus without complication ([place])   ? type 2  ? Family history of breast cancer   ? Family history of lymphoma   ? Family history of prostate cancer   ? GERD (gastroesophageal reflux disease)   ? Hyperlipidemia   ? Hypertension   ? Hypothyroidism   ? Lung nodule   ? Thyroid disease   ? ? ?Past Surgical History:  ?Procedure Laterality Date  ? BREAST BIOPSY Left 11/25/2015  ? stereo  neg  ? BREAST EXCISIONAL BIOPSY Left yrs ago  ? benign  ? COLONOSCOPY WITH PROPOFOL N/A 04/20/2017  ? Procedure: COLONOSCOPY WITH PROPOFOL;  Surgeon: [name], MD;  Location[facility] ENDOSCOPY;  Service: Endoscopy;  Laterality: N/A;  ? COLONOSCOPY WITH PROPOFOL N/A 01/08/2018  ? Procedure: COLONOSCOPY WITH PROPOFOL;  Surgeon: [name], MD;  Location: [facility] ENDOSCOPY;  Service: Gastroenterology;  Laterality: N/A;  ? COLONOSCOPY WITH PROPOFOL N/A 08/10/2018  ? Procedure: COLONOSCOPY WITH PROPOFOL;  Surgeon: [name], MD;  Location: [facility] ENDOSCOPY;  Service: Gastroenterology;  Laterality: N/A;  ? COLONOSCOPY WITH PROPOFOL N/A 02/08/2021  ? Procedure: COLONOSCOPY WITH PROPOFOL;  Surgeon: [name], MD;  Location: [facility] ENDOSCOPY;  Service: Gastroenterology;  Laterality: N/A;  ? IR RADIOLOGIST EVAL & MGMT  04/08/2020  ? IR RADIOLOGIST EVAL & MGMT  05/26/2020  ? IR RADIOLOGIST EVAL & MGMT  09/29/2020  ? IR RADIOLOGIST EVAL & MGMT  03/10/2021  ? RADIOLOGY WITH ANESTHESIA N/A 04/29/2020  ? Procedure: CRYOABLATION;  Surgeon: Vernard Gambles,  Quillian Quince, MD;  Location: [facility] ORS;  Service: Radiology;  Laterality: N/A;  ? THYROIDECTOMY    ? Dr. [name]  ? VAGINAL DELIVERY    ? 4  ? ? ?Allergies: ?Patient has no known allergies. ? ?Medications: ?Prior to Admission medications   ?Medication Sig Start Date End Date Taking? Authorizing Provider  ?albuterol (VENTOLIN HFA) 108 (90 Base) MCG/ACT inhaler Inhale 2 puffs into the lungs every 6 (six) hours  as needed for wheezing or shortness of breath. 11/19/21   Burnard Hawthorne, FNP  ?amLODipine (NORVASC) 10 MG tablet TAKE 1 TABLET BY MOUTH EVERY DAY 05/27/21   Burnard Hawthorne, FNP  ?aspirin EC 81 MG tablet Take 81 mg by mouth daily. Swallow whole.    [provider]  ?atorvastatin (LIPITOR) 20 MG tablet TAKE 1 TABLET BY MOUTH EVERY DAY 11/22/21   Burnard Hawthorne, FNP  ?blood glucose meter kit and supplies KIT Dispense based on patient and insurance preference. Use up to four times daily as directed. 05/17/21   Burnard Hawthorne, FNP  ?Calcium Carbonate-Vit D-Min (CALCIUM 600+D PLUS MINERALS) 600-400 MG-UNIT TABS Take 1 tablet by mouth daily.     [provider]  ?diclofenac sodium (VOLTAREN) 1 % GEL Apply 1 application topically 4 (four) times daily as needed (knee pain.).     [provider]  ?DULoxetine (CYMBALTA) 60 MG capsule Take 1 capsule (60 mg total) by mouth daily. 10/19/20   Burnard Hawthorne, FNP  ?Lancet Devices (ONE TOUCH DELICA LANCING DEV) MISC 1 Device by Does not apply route daily. 06/15/21   Burnard Hawthorne, FNP  ?Lancets (ONETOUCH DELICA PLUS [id number]) MISC USE AS INSTRUCTED 04/08/21   Burnard Hawthorne, FNP  ?levothyroxine (SYNTHROID) 137 MCG tablet TAKE 1 TABLET BY MOUTH EVERY DAY BEFORE BREAKFAST 11/15/21   Burnard Hawthorne, FNP  ?losartan-hydrochlorothiazide (HYZAAR) 50-12.5 MG tablet TAKE 1 TABLET BY MOUTH EVERY DAY 12/01/21   Kennyth Arnold, FNP  ?metFORMIN (GLUCOPHAGE) 500 MG tablet TAKE 1 TABLET BY MOUTH TWICE A DAY 11/15/21   Burnard Hawthorne, FNP  ?metoprolol tartrate (LOPRESSOR) 25 MG tablet Take 1 tablet (25 mg total) by mouth 2 (two) times daily. 04/17/19   Burnard Hawthorne, FNP  ?ONETOUCH ULTRA test strip TEST BLOOD SUGAR 1-2 TIMES A DAY 09/24/21   Burnard Hawthorne, FNP  ?  ? ?Family History  ?Problem Relation Age of Onset  ? Hypertension Mother   ? Hypertension Father   ? Prostate cancer Father 46  ? Diabetes Sister   ? Thyroid disease Sister   ?  Breast cancer Sister 72  ? Breast cancer Sister 20  ? Lymphoma Sister 44  ? Breast cancer Maternal Aunt   ? Breast cancer Maternal Aunt   ? Cancer Paternal Grandmother   ?     unk type  ? ? ?Social History  ? ?Socioeconomic History  ? Marital status: Married  ?  Spouse name: Not on file  ? Number of children: 4  ? Years of education: Not on file  ? Highest education level: Not on file  ?Occupational History  ? Not on file  ?Tobacco Use  ? Smoking status: Former  ?  Packs/day: 0.75  ?  Years: 48.00  ?  Pack years: 36.00  ?  Types: Cigarettes  ?  Quit date: 2015  ?  Years since quitting: 8.2  ? Smokeless tobacco: Former  ?  Types: Snuff, Chew  ?Vaping Use  ? Vaping Use: Never  used  ?Substance and Sexual Activity  ? Alcohol use: No  ? Drug use: No  ? Sexual activity: Not Currently  ?Other Topics Concern  ? Not on file  ?Social History Narrative  ? Lives in [place] with husband. Has 4 children.  ?   ? 4 grandchildren.  ?   ? Work - Retired from [employer]  ?   ? Excercise- walking the track, 4x per week  ?   ? Diet- regular  ?   ? ?Social Determinants of Health  ? ?Financial Resource Strain: Not on file  ?Food Insecurity: Not on file  ?Transportation Needs: Not on file  ?Physical Activity: Not on file  ?Stress: Not on file  ?Social Connections: Not on file  ? ? ?ECOG Status: ?0 - Asymptomatic ? ?Review of Systems ? ?Review of Systems: A 12 point ROS discussed and pertinent positives are indicated in the HPI above.  All other systems are negative. ? ?Physical Exam ?No direct physical exam was performed (except for noted visual exam findings with Video Visits).  ?   ?Vital Signs: ?There were no vitals taken for this visit. ? ?Imaging: ?MR ABDOMEN WWO CONTRAST ? ?Result Date: 11/23/2021 ?CLINICAL DATA:  Status post cryoablation of LEFT renal mass. Routine annual surveillance. EXAM: MRI ABDOMEN WITHOUT AND WITH CONTRAST TECHNIQUE: Multiplanar multisequence MR imaging of the abdomen was performed both before and after the  administration of intravenous contrast. CONTRAST:  68m GADAVIST GADOBUTROL 1 MMOL/ML IV SOLN COMPARISON:  MRI 03/08/2021 FINDINGS: Lower chest:  Lung bases are clear. Hepatobiliary: No focal hepatic lesion.

## 2021-12-15 ENCOUNTER — Other Ambulatory Visit: Payer: Self-pay

## 2021-12-15 ENCOUNTER — Other Ambulatory Visit (INDEPENDENT_AMBULATORY_CARE_PROVIDER_SITE_OTHER): Payer: Medicare Other

## 2021-12-15 DIAGNOSIS — E1140 Type 2 diabetes mellitus with diabetic neuropathy, unspecified: Secondary | ICD-10-CM | POA: Diagnosis not present

## 2021-12-15 LAB — LIPID PANEL
Cholesterol: 131 mg/dL (ref 0–200)
HDL: 54.2 mg/dL (ref >39.00)
LDL Cholesterol: 65 mg/dL (ref 0–99)
NonHDL: 76.75
Total CHOL/HDL Ratio: 2
Triglycerides: 59 mg/dL (ref 0.0–149.0)
VLDL: 11.8 mg/dL (ref 0.0–40.0)

## 2021-12-15 LAB — CBC WITH DIFFERENTIAL/PLATELET
Basophils Absolute: 0.1 10*3/uL (ref 0.0–0.1)
Basophils Relative: 1.4 % (ref 0.0–3.0)
Eosinophils Absolute: 0.3 10*3/uL (ref 0.0–0.7)
Eosinophils Relative: 5.7 % — ABNORMAL HIGH (ref 0.0–5.0)
HCT: 40.3 % (ref 36.0–46.0)
Hemoglobin: 13.6 g/dL (ref 12.0–15.0)
Lymphocytes Relative: 37.6 % (ref 12.0–46.0)
Lymphs Abs: 1.7 10*3/uL (ref 0.7–4.0)
MCHC: 33.8 g/dL (ref 30.0–36.0)
MCV: 93.4 fl (ref 78.0–100.0)
Monocytes Absolute: 0.7 10*3/uL (ref 0.1–1.0)
Monocytes Relative: 14.3 % — ABNORMAL HIGH (ref 3.0–12.0)
Neutro Abs: 1.9 10*3/uL (ref 1.4–7.7)
Neutrophils Relative %: 41 % — ABNORMAL LOW (ref 43.0–77.0)
Platelets: 274 10*3/uL (ref 150.0–400.0)
RBC: 4.31 Mil/uL (ref 3.87–5.11)
RDW: 14.7 % (ref 11.5–15.5)
WBC: 4.6 10*3/uL (ref 4.0–10.5)

## 2021-12-15 LAB — COMPREHENSIVE METABOLIC PANEL
ALT: 20 U/L (ref 0–35)
AST: 20 U/L (ref 0–37)
Albumin: 4.1 g/dL (ref 3.5–5.2)
Alkaline Phosphatase: 69 U/L (ref 39–117)
BUN: 11 mg/dL (ref 6–23)
CO2: 26 mEq/L (ref 19–32)
Calcium: 9.9 mg/dL (ref 8.4–10.5)
Chloride: 101 mEq/L (ref 96–112)
Creatinine, Ser: 0.67 mg/dL (ref 0.40–1.20)
GFR: 85.04 mL/min (ref >60.00)
Glucose, Bld: 115 mg/dL — ABNORMAL HIGH (ref 70–99)
Potassium: 4.1 mEq/L (ref 3.5–5.1)
Sodium: 138 mEq/L (ref 135–145)
Total Bilirubin: 0.5 mg/dL (ref 0.2–1.2)
Total Protein: 6.8 g/dL (ref 6.0–8.3)

## 2021-12-15 LAB — TSH: TSH: 1.24 u[IU]/mL (ref 0.35–5.50)

## 2021-12-15 NOTE — Addendum Note (Signed)
Addended by: Leeanne Rio on: 12/15/2021 11:15 AM ? ? Modules accepted: Orders ? ?

## 2021-12-17 LAB — MICROALBUMIN / CREATININE URINE RATIO
Creatinine,U: 71.1 mg/dL
Microalb Creat Ratio: 1 mg/g (ref 0.0–30.0)
Microalb, Ur: <0.7 mg/dL (ref 0.0–1.9)

## 2021-12-20 ENCOUNTER — Other Ambulatory Visit: Payer: Self-pay

## 2021-12-20 DIAGNOSIS — I10 Essential (primary) hypertension: Secondary | ICD-10-CM

## 2021-12-20 DIAGNOSIS — R899 Unspecified abnormal finding in specimens from other organs, systems and tissues: Secondary | ICD-10-CM

## 2021-12-23 ENCOUNTER — Other Ambulatory Visit: Payer: Self-pay | Admitting: *Deleted

## 2021-12-23 DIAGNOSIS — Z87891 Personal history of nicotine dependence: Secondary | ICD-10-CM

## 2021-12-23 DIAGNOSIS — Z122 Encounter for screening for malignant neoplasm of respiratory organs: Secondary | ICD-10-CM

## 2021-12-28 ENCOUNTER — Telehealth: Payer: Self-pay | Admitting: Family

## 2021-12-28 DIAGNOSIS — U071 COVID-19: Secondary | ICD-10-CM | POA: Diagnosis not present

## 2021-12-28 DIAGNOSIS — R059 Cough, unspecified: Secondary | ICD-10-CM | POA: Diagnosis not present

## 2021-12-28 NOTE — Telephone Encounter (Addendum)
Pt called in stating her chest, back and legs are hurting. Pt also stated she is coughing and has a headache. Sent to access nurse ?

## 2021-12-29 NOTE — Telephone Encounter (Signed)
I was able to call and speak with patient & she did go to [facility]. She actually does have Covid & patient was prescribed Paxlovid. She will pick up from [facility] & start. Pt was okay but congested this morning, but said HA was the worst part. She is taking extra strength tylenol for this. She was asked if she wanted to make f/u later on with Joycelyn Schmid & she stated that she would wait to see how Paxlovid works for her symptoms.  ?

## 2021-12-29 NOTE — Telephone Encounter (Signed)
noted 

## 2021-12-30 ENCOUNTER — Other Ambulatory Visit: Payer: Medicare Other

## 2021-12-31 ENCOUNTER — Other Ambulatory Visit: Payer: Medicare Other

## 2022-01-05 ENCOUNTER — Ambulatory Visit: Payer: Medicare Other | Attending: Acute Care

## 2022-01-06 ENCOUNTER — Ambulatory Visit: Payer: Self-pay | Admitting: Urology

## 2022-01-14 ENCOUNTER — Other Ambulatory Visit: Payer: Self-pay | Admitting: Family

## 2022-01-14 DIAGNOSIS — M545 Low back pain, unspecified: Secondary | ICD-10-CM

## 2022-01-19 ENCOUNTER — Other Ambulatory Visit: Payer: Self-pay

## 2022-01-19 ENCOUNTER — Telehealth: Payer: Self-pay | Admitting: Family

## 2022-01-19 DIAGNOSIS — I10 Essential (primary) hypertension: Secondary | ICD-10-CM

## 2022-01-19 NOTE — Telephone Encounter (Signed)
Lab ordered.

## 2022-01-19 NOTE — Telephone Encounter (Signed)
Patient has a lab appt 01/25/2022, there are no orders in. ?

## 2022-01-19 NOTE — Telephone Encounter (Signed)
See result note ?Order labs ? ?

## 2022-01-25 ENCOUNTER — Other Ambulatory Visit: Payer: Medicare Other

## 2022-02-01 ENCOUNTER — Other Ambulatory Visit (INDEPENDENT_AMBULATORY_CARE_PROVIDER_SITE_OTHER): Payer: Medicare Other

## 2022-02-01 DIAGNOSIS — I10 Essential (primary) hypertension: Secondary | ICD-10-CM

## 2022-02-01 LAB — CBC WITH DIFFERENTIAL/PLATELET
Basophils Absolute: 0.1 10*3/uL (ref 0.0–0.1)
Basophils Relative: 1.9 % (ref 0.0–3.0)
Eosinophils Absolute: 0.2 10*3/uL (ref 0.0–0.7)
Eosinophils Relative: 5.2 % — ABNORMAL HIGH (ref 0.0–5.0)
HCT: 40 % (ref 36.0–46.0)
Hemoglobin: 13.5 g/dL (ref 12.0–15.0)
Lymphocytes Relative: 36.3 % (ref 12.0–46.0)
Lymphs Abs: 1.3 10*3/uL (ref 0.7–4.0)
MCHC: 33.7 g/dL (ref 30.0–36.0)
MCV: 94 fl (ref 78.0–100.0)
Monocytes Absolute: 0.6 10*3/uL (ref 0.1–1.0)
Monocytes Relative: 15.3 % — ABNORMAL HIGH (ref 3.0–12.0)
Neutro Abs: 1.5 10*3/uL (ref 1.4–7.7)
Neutrophils Relative %: 41.3 % — ABNORMAL LOW (ref 43.0–77.0)
Platelets: 261 10*3/uL (ref 150.0–400.0)
RBC: 4.26 Mil/uL (ref 3.87–5.11)
RDW: 14.4 % (ref 11.5–15.5)
WBC: 3.7 10*3/uL — ABNORMAL LOW (ref 4.0–10.5)

## 2022-02-03 ENCOUNTER — Ambulatory Visit: Payer: Medicare Other | Admitting: Podiatry

## 2022-02-08 ENCOUNTER — Telehealth: Payer: Self-pay

## 2022-02-08 NOTE — Telephone Encounter (Signed)
LMTCB for lab results.  

## 2022-02-09 ENCOUNTER — Other Ambulatory Visit: Payer: Self-pay

## 2022-02-09 DIAGNOSIS — R899 Unspecified abnormal finding in specimens from other organs, systems and tissues: Secondary | ICD-10-CM

## 2022-02-09 DIAGNOSIS — R898 Other abnormal findings in specimens from other organs, systems and tissues: Secondary | ICD-10-CM

## 2022-02-09 NOTE — Telephone Encounter (Signed)
Patient returned Cheri Rous, CMA's call.  I spoke with Judson Roch and relayed message to  patient for Judson Roch that she has a nurse visit, but she will call the patient right back.

## 2022-02-09 NOTE — Telephone Encounter (Signed)
Result notes reviewed with patient.

## 2022-02-10 ENCOUNTER — Ambulatory Visit: Payer: Medicare Other | Admitting: Podiatry

## 2022-02-10 ENCOUNTER — Encounter: Payer: Self-pay | Admitting: Podiatry

## 2022-02-10 DIAGNOSIS — M79675 Pain in left toe(s): Secondary | ICD-10-CM

## 2022-02-10 DIAGNOSIS — B351 Tinea unguium: Secondary | ICD-10-CM

## 2022-02-10 DIAGNOSIS — M79674 Pain in right toe(s): Secondary | ICD-10-CM

## 2022-02-10 DIAGNOSIS — E1140 Type 2 diabetes mellitus with diabetic neuropathy, unspecified: Secondary | ICD-10-CM | POA: Diagnosis not present

## 2022-02-10 NOTE — Progress Notes (Signed)

## 2022-02-22 ENCOUNTER — Other Ambulatory Visit: Payer: Self-pay | Admitting: Family

## 2022-02-22 DIAGNOSIS — E785 Hyperlipidemia, unspecified: Secondary | ICD-10-CM

## 2022-04-05 ENCOUNTER — Other Ambulatory Visit: Payer: Medicare Other

## 2022-04-06 ENCOUNTER — Other Ambulatory Visit: Payer: Self-pay | Admitting: Family

## 2022-04-06 DIAGNOSIS — Z1231 Encounter for screening mammogram for malignant neoplasm of breast: Secondary | ICD-10-CM

## 2022-04-07 ENCOUNTER — Other Ambulatory Visit (INDEPENDENT_AMBULATORY_CARE_PROVIDER_SITE_OTHER): Payer: Medicare Other

## 2022-04-07 DIAGNOSIS — R898 Other abnormal findings in specimens from other organs, systems and tissues: Secondary | ICD-10-CM

## 2022-04-07 DIAGNOSIS — R899 Unspecified abnormal finding in specimens from other organs, systems and tissues: Secondary | ICD-10-CM

## 2022-04-07 LAB — CBC WITH DIFFERENTIAL/PLATELET
Basophils Absolute: 0.1 10*3/uL (ref 0.0–0.1)
Basophils Relative: 1.8 % (ref 0.0–3.0)
Eosinophils Absolute: 0.2 10*3/uL (ref 0.0–0.7)
Eosinophils Relative: 4.7 % (ref 0.0–5.0)
HCT: 41.3 % (ref 36.0–46.0)
Hemoglobin: 13.6 g/dL (ref 12.0–15.0)
Lymphocytes Relative: 32.4 % (ref 12.0–46.0)
Lymphs Abs: 1.1 10*3/uL (ref 0.7–4.0)
MCHC: 32.9 g/dL (ref 30.0–36.0)
MCV: 94.2 fl (ref 78.0–100.0)
Monocytes Absolute: 0.6 10*3/uL (ref 0.1–1.0)
Monocytes Relative: 17 % — ABNORMAL HIGH (ref 3.0–12.0)
Neutro Abs: 1.5 10*3/uL (ref 1.4–7.7)
Neutrophils Relative %: 44.1 % (ref 43.0–77.0)
Platelets: 280 10*3/uL (ref 150.0–400.0)
RBC: 4.38 Mil/uL (ref 3.87–5.11)
RDW: 14.2 % (ref 11.5–15.5)
WBC: 3.4 10*3/uL — ABNORMAL LOW (ref 4.0–10.5)

## 2022-04-21 ENCOUNTER — Telehealth: Payer: Medicare Other

## 2022-04-21 ENCOUNTER — Ambulatory Visit (INDEPENDENT_AMBULATORY_CARE_PROVIDER_SITE_OTHER): Payer: Medicare Other

## 2022-04-21 VITALS — Ht 69.0 in | Wt 230.0 lb

## 2022-04-21 DIAGNOSIS — Z0000 Encounter for general adult medical examination without abnormal findings: Secondary | ICD-10-CM | POA: Diagnosis not present

## 2022-04-21 NOTE — Progress Notes (Signed)
Subjective:   Cynthia [name] is a 76 y.o. female who presents for Medicare Annual (Subsequent) preventive examination.  Review of Systems    No ROS.  Medicare Wellness Virtual Visit.  Visual/audio telehealth visit, UTA vital signs.   See social history for additional risk factors.   Cardiac Risk Factors include: advanced age (>5mn, >>51women);hypertension;diabetes mellitus     Objective:    Today's Vitals   04/21/22 0832  Weight: 230 lb (104.3 kg)  Height: _0  (1.753 m)   Body mass index is 33.97 kg/m.     04/21/2022    9:29 AM 04/27/2021   10:15 AM 04/20/2021   11:47 AM 02/08/2021    8:53 AM 10/23/2020    1:02 PM 06/11/2020   10:36 AM 04/29/2020    1:15 PM  Advanced Directives  Does Patient Have a Medical Advance Directive? _1  No No  Would patient like information on creating a medical advance directive? No - Patient declined  No - Patient declined  No - Patient declined No - Patient declined No - Patient declined    Current Medications (verified) Outpatient Encounter Medications as of 04/21/2022  Medication Sig   albuterol (VENTOLIN HFA) 108 (90 Base) MCG/ACT inhaler Inhale 2 puffs into the lungs every 6 (six) hours as needed for wheezing or shortness of breath.   amLODipine (NORVASC) 10 MG tablet TAKE 1 TABLET BY MOUTH EVERY DAY   aspirin EC 81 MG tablet Take 81 mg by mouth daily. Swallow whole.   atorvastatin (LIPITOR) 20 MG tablet TAKE 1 TABLET BY MOUTH EVERY DAY   blood glucose meter kit and supplies KIT Dispense based on patient and insurance preference. Use up to four times daily as directed.   Calcium Carbonate-Vit D-Min (CALCIUM 600+D PLUS MINERALS) 600-400 MG-UNIT TABS Take 1 tablet by mouth daily.    diclofenac sodium (VOLTAREN) 1 % GEL Apply 1 application topically 4 (four) times daily as needed (knee pain.).    DULoxetine (CYMBALTA) 60 MG capsule TAKE 1 CAPSULE BY MOUTH EVERY DAY   Lancet Devices (ONE TOUCH DELICA LANCING DEV) MISC 1 Device by Does  not apply route daily.   Lancets (ONETOUCH DELICA PLUS [id number] MISC USE AS INSTRUCTED   levothyroxine (SYNTHROID) 137 MCG tablet TAKE 1 TABLET BY MOUTH EVERY DAY BEFORE BREAKFAST   losartan-hydrochlorothiazide (HYZAAR) 50-12.5 MG tablet TAKE 1 TABLET BY MOUTH EVERY DAY   metFORMIN (GLUCOPHAGE) 500 MG tablet TAKE 1 TABLET BY MOUTH TWICE A DAY   metoprolol tartrate (LOPRESSOR) 25 MG tablet Take 1 tablet (25 mg total) by mouth 2 (two) times daily.   ONETOUCH ULTRA test strip TEST BLOOD SUGAR 1-2 TIMES A DAY   No facility-administered encounter medications on file as of 04/21/2022.    Allergies (verified) Patient has no known allergies.   History: Past Medical History:  Diagnosis Date   Arthritis    [facility]   cancer[facility] pylop in ap[facility].  kidney left    COPD (chronic obstructive pulmonary disease) ([facility])    Coronary artery disease    pt. denies   Diabetes mellitus [place] complication ([place]    type 2   Family history of breast cancer    Family history of lymphoma    Family history of prostate cancer    GERD (gastroesophageal reflux disease)    Hyperlipidemia    Hypertension    Hypothyroidism    Lung nodule    Thyroid disease    Past Surgical History:  Procedure Laterality Date   BREAST BIOPSY Left 11/25/2015   stereo  neg   BREAST EXCISIONAL BIOPSY Left yrs ago   benign   COLONOSCOPY WITH PROPOFOL N/A 04/20/2017   Procedure: COLONOSCOPY WITH PROPOFOL;  Surgeon: [name], MD;  Location[facility] ENDOSCOPY;  Service: Endoscopy;  Laterality: N/A;   COLONOSCOPY WITH PROPOFOL N/A 01/08/2018   Procedure: COLONOSCOPY WITH PROPOFOL;  Surgeon: [name], MD;  Location: [facility] ENDOSCOPY;  Service: Gastroenterology;  Laterality: N/A;   COLONOSCOPY WITH PROPOFOL N/A 08/10/2018   Procedure: COLONOSCOPY WITH PROPOFOL;  Surgeon: [name], MD;  Location: [facility] ENDOSCOPY;  Service: Gastroenterology;  Laterality: N/A;   COLONOSCOPY WITH PROPOFOL N/A 02/08/2021   Procedure: COLONOSCOPY  WITH PROPOFOL;  Surgeon: [name], MD;  Location: [facility] ENDOSCOPY;  Service: Gastroenterology;  Laterality: N/A;   IR RADIOLOGIST EVAL & MGMT  04/08/2020   IR RADIOLOGIST EVAL & MGMT  05/26/2020   IR RADIOLOGIST EVAL & MGMT  09/29/2020   IR RADIOLOGIST EVAL & MGMT  03/10/2021   IR RADIOLOGIST EVAL & MGMT  12/10/2021   RADIOLOGY WITH ANESTHESIA N/A 04/29/2020   Procedure: CRYOABLATION;  Surgeon: Arne Cleveland, MD;  Location: [facility];  Service: Radiology;  Laterality: N/A;   THYROIDECTOMY     Dr. [name]   VAGINAL DELIVERY     4   Family History  Problem Relation Age of Onset   Hypertension Mother    Hypertension Father    Prostate cancer Father 35   Diabetes Sister    Thyroid disease Sister    Breast cancer Sister 32   Breast cancer Sister 33   Lymphoma Sister 79   Breast cancer Maternal Aunt    Breast cancer Maternal Aunt    Cancer Paternal Grandmother        unk type   Social History   Socioeconomic History   Marital status: Married    Spouse name: Not on file   Number of children: 4   Years of education: Not on file   Highest education level: Not on file  Occupational History   Not on file  Tobacco Use   Smoking status: Former    Packs/day: 0.75    Years: 48.00    Total pack years: 36.00    Types: Cigarettes    Quit date: 2015    Years since quitting: 8.5   Smokeless tobacco: Former    Types: Snuff, Database administrator Use   Vaping Use: Never used  Substance and Sexual Activity   Alcohol use: No   Drug use: No   Sexual activity: Not Currently  Other Topics Concern   Not on file  Social History Narrative   Lives in [place] with husband. Has 4 children.      4 grandchildren.      Work - Retired from [employer]- walking the track, 4x per week      Diet- regular      Social Determinants of Health   Financial Resource Strain: Low Risk  (04/21/2022)   Overall Financial Resource Strain (CARDIA)    Difficulty of Paying Living Expenses: Not hard  at all  Food Insecurity: No Food Insecurity (04/21/2022)   Hunger Vital Sign    Worried About Running Out of Food in the Last Year: Never true    Ran Out of Food in the Last Year: Never true  Transportation Needs: No Transportation Needs (04/21/2022)   PRAPARE - Hydrologist (  Medical): No    Lack of Transportation (Non-Medical): No  Physical Activity: Sufficiently Active (04/21/2022)   Exercise Vital Sign    Days of Exercise per Week: 5 days    Minutes of Exercise per Session: 30 min  Stress: No Stress Concern Present (04/21/2022)   [place]    Feeling of Stress : Not at all  Social Connections: [place] (04/21/2022)   Social Connection and Isolation Panel [NHANES]    Frequency of Communication with Friends and Family: More than three times a week    Frequency of Social Gatherings with Friends and Family: Once a week    Attends Religious Services: More than 4 times per year    Active Member of [employer] or Organizations: Yes    Attends Music therapist: More than 4 times per year    Marital Status: Married    Tobacco Counseling Counseling given: Not Answered   Clinical Intake:  Pre-visit preparation completed: Yes        Diabetes: Yes (Followed by PCP)  How often do you need to have someone help you when you read instructions, pamphlets, or other written materials from your doctor or pharmacy?: 1 - Never   Interpreter Needed?: No      Activities of Daily Living    04/21/2022    8:36 AM  In your present state of health, do you have any difficulty performing the following activities:  Hearing? 0  Vision? 0  Difficulty concentrating or making decisions? 0  Walking or climbing stairs? 0  Dressing or bathing? 0  Doing errands, shopping? 0  Preparing Food and eating ? N  Using the Toilet? N  In the past six months, have you accidently leaked urine? N  Do you  have problems with loss of bowel control? N  Managing your Medications? N  Managing your Finances? N  Housekeeping or managing your Housekeeping? N    Patient Care Team: Burnard Hawthorne, FNP as PCP - General (Family Medicine) Clent Jacks, RN as Registered Nurse Earlie Server, MD as Consulting Physician (Hematology and Oncology)  Indicate any recent Medical Services you may have received from other than [facility] providers in the past year (date may be approximate).     Assessment:   This is a routine wellness examination for [place].  Virtual Visit via Telephone Note  I connected with  [name] on 04/21/22 at  8:30 AM EDT by telephone and verified that I am speaking with the correct person using two identifiers.  Location: Patient: home Provider: office Persons participating in the virtual visit: patient/Nurse Health Advisor   I discussed the limitations of performing an evaluation and management service by telehealth. We continued and completed visit with audio only. Some vital signs may be absent or patient reported.   Hearing/Vision screen Hearing Screening - Comments:: Patient is able to hear conversational tones without difficulty. No issues reported. Vision Screening - Comments:: Followed by [facility]  Wears corrective lenses  Diabetic exam; no retinopathy reported  They have regular follow up with the ophthalmologist  Dietary issues and exercise activities discussed: Current Exercise Habits: Home exercise routine, Intensity: Mild Regular diet; she tries to have a healthy diet Good water intake   Goals Addressed             This Visit's Progress    Maintain weight   On track    [place].  LOW CARB FOODS.   STRETCH AND STAY ACTIVE BY CONTINUING TO WALK.        Depression Screen    04/21/2022    8:34 AM 05/05/2021    9:08 AM 04/20/2021   11:49 AM 06/16/2020    9:22 AM 04/17/2020    9:42 AM 12/06/2019   12:30  PM 07/05/2019    8:37 AM  PHQ 2/9 Scores  PHQ - 2 Score 0 0 0 0 0 0 0  PHQ- 9 Score    0  0     Fall Risk    04/21/2022    8:36 AM 05/05/2021    9:08 AM 04/20/2021   11:49 AM 10/19/2020   10:57 AM 04/17/2020   10:03 AM  Fall Risk   Falls in the past year? 0 0 0 0   Number falls in past yr: 0 0  0   Injury with Fall? 0 0 0 0   Follow up _0     FALL RISK PREVENTION PERTAINING TO THE HOME: Home free of loose throw rugs in walkways, pet beds, electrical cords, etc? Yes  Adequate lighting in your home to reduce risk of falls? Yes   ASSISTIVE DEVICES UTILIZED TO PREVENT FALLS: Life alert? No  Use of a cane, [place] or w/c? No   TIMED UP AND GO: Was the test performed? No .   Cognitive Function: Patient is alert and oriented x3.      04/07/2017    9:24 AM 04/07/2016    9:57 AM 04/02/2015    9:33 AM  MMSE - Mini Mental State Exam  Orientation to time _1 Orientation to Place _2 Registration _3 Attention/ Calculation _4 Attention/Calculation-comments Difficulty performing simple calculation    Recall _5 Language- name 2 objects _6 Language- repeat _7 Language- follow 3 step command _8 Language- read & follow direction _9 Write a sentence _10 Copy design _11 Total score _12 04/21/2022    9:29 AM 04/20/2021   11:50 AM 04/17/2020   10:04 AM 04/17/2019    9:45 AM 04/09/2018    9:12 AM  6CIT Screen  What Year? 0 points 0 points 0 points 0 points 0 points  What month? 0 points 0 points 0 points 0 points 0 points  What time? 0 points 0 points 0 points 0 points 0 points  Count back from 20 0 points 0 points  0 points 0 points  Months in reverse  2 points  0 points 2 points  Repeat phrase 0 points 0 points  0 points   Total Score  2 points  0 points     Immunizations Immunization History   Administered Date(s) Administered   Fluad Quad(high Dose 65+) 05/24/2019, 06/16/2020, 10/27/2021   Influenza, High Dose Seasonal PF 07/18/2017, 07/11/2018   Influenza,inj,Quad PF,6+ Mos 06/13/2013, 06/13/2014, 07/03/2015, 05/11/2016   Influenza-Unspecified 07/18/2012   PFIZER(Purple Top)SARS-COV-2 Vaccination 12/20/2019, 01/14/2020   Pneumococcal Conjugate-13 03/07/2014   Pneumococcal Polysaccharide-23 03/08/2013   Tdap 05/17/2016   Shingrix Completed?: No.    Education has been provided regarding the importance of this vaccine. Patient has been advised to call insurance company to determine out of pocket expense if they have  not yet received this vaccine. Advised may also receive vaccine at local pharmacy or Health Dept. Verbalized acceptance and understanding.  Screening Tests Health Maintenance  Topic Date Due   INFLUENZA VACCINE  04/19/2022   COVID-19 Vaccine (3 - Pfizer risk series) 05/07/2022 (Originally 02/11/2020)   Zoster Vaccines- Shingrix (1 of 2) 07/22/2022 (Originally 10/07/1964)   HEMOGLOBIN A1C  04/26/2022   FOOT EXAM  05/06/2022   OPHTHALMOLOGY EXAM  05/31/2022   COLONOSCOPY (Pts 45-14yr Insurance coverage will need to be confirmed)  02/09/2024   TETANUS/TDAP  05/17/2026   Pneumonia Vaccine 76 Years old  Completed   DEXA SCAN  Completed   Hepatitis C Screening  Completed   HPV VACCINES  Aged Out   Health Maintenance Health Maintenance Due  Topic Date Due   INFLUENZA VACCINE  04/19/2022   Lung Cancer Screening: (Low Dose CT Chest recommended if Age 76-80years, 30 pack-year currently smoking OR have quit w/in 15years.)  Ordered 12/23/21   Vision Screening: Recommended annual ophthalmology exams for early detection of glaucoma and other disorders of the eye.  Dental Screening: Recommended annual dental exams for proper oral hygiene  Community Resource Referral / Chronic Care Management: CRR required this visit?  No   CCM required this visit?  No      Plan:    Keep all routine maintenance appointments.   I have personally reviewed and noted the following in the patient's chart:   Medical and social history Use of alcohol, tobacco or illicit drugs  Current medications and supplements including opioid prescriptions.  Functional ability and status Nutritional status Physical activity Advanced directives List of other physicians Hospitalizations, surgeries, and ER visits in previous 12 months Vitals Screenings to include cognitive, depression, and falls Referrals and appointments  In addition, I have reviewed and discussed with patient certain preventive protocols, quality metrics, and best practice recommendations. A written personalized care plan for preventive services as well as general preventive health recommendations were provided to patient.     OVarney Biles LPN   [id number]/04/[id number]

## 2022-04-21 NOTE — Patient Instructions (Addendum)
  Cynthia [name] , Thank you for taking time to come for your Medicare Wellness Visit. I appreciate your ongoing commitment to your health goals. Please review the following plan we discussed and let me know if I can assist you in the future.   These are the goals we discussed:  Goals      Follow up with Primary Care Provider     As needed     Maintain weight     STAY HYDRATED AND DRINK PLENTY OF FLUIDS.   LOW CARB FOODS.   STRETCH AND STAY ACTIVE BY CONTINUING TO WALK.         This is a list of the screening recommended for you and due dates:  Health Maintenance  Topic Date Due   Flu Shot  04/19/2022   COVID-19 Vaccine (3 - Pfizer risk series) 05/07/2022*   Zoster (Shingles) Vaccine (1 of 2) 07/22/2022*   Hemoglobin A1C  04/26/2022   Complete foot exam   05/06/2022   Eye exam for diabetics  05/31/2022   Colon Cancer Screening  02/09/2024   Tetanus Vaccine  05/17/2026   Pneumonia Vaccine  Completed   DEXA scan (bone density measurement)  Completed   Hepatitis C Screening: USPSTF Recommendation to screen - Ages 47-79 yo.  Completed   HPV Vaccine  Aged Out  *Topic was postponed. The date shown is not the original due date.

## 2022-04-22 ENCOUNTER — Ambulatory Visit: Payer: Medicare Other | Admitting: Family

## 2022-04-29 ENCOUNTER — Ambulatory Visit
Admission: RE | Admit: 2022-04-29 | Discharge: 2022-04-29 | Disposition: A | Discharge status: Home / Self Care | Payer: Medicare Other | Source: Ambulatory Visit | Attending: Family | Admitting: Family

## 2022-04-29 DIAGNOSIS — Z1231 Encounter for screening mammogram for malignant neoplasm of breast: Secondary | ICD-10-CM | POA: Diagnosis not present

## 2022-05-03 ENCOUNTER — Encounter: Payer: Self-pay | Admitting: Oncology

## 2022-05-03 ENCOUNTER — Inpatient Hospital Stay: Payer: Medicare Other | Attending: Oncology

## 2022-05-03 ENCOUNTER — Inpatient Hospital Stay (HOSPITAL_BASED_OUTPATIENT_CLINIC_OR_DEPARTMENT_OTHER): Payer: Medicare Other | Admitting: Oncology

## 2022-05-03 VITALS — BP 113/74 | HR 73 | Temp 97.1°F | Ht 69.0 in | Wt 227.0 lb

## 2022-05-03 DIAGNOSIS — Z833 Family history of diabetes mellitus: Secondary | ICD-10-CM | POA: Diagnosis not present

## 2022-05-03 DIAGNOSIS — Z8249 Family history of ischemic heart disease and other diseases of the circulatory system: Secondary | ICD-10-CM | POA: Diagnosis not present

## 2022-05-03 DIAGNOSIS — N289 Disorder of kidney and ureter, unspecified: Secondary | ICD-10-CM | POA: Insufficient documentation

## 2022-05-03 DIAGNOSIS — Z8042 Family history of malignant neoplasm of prostate: Secondary | ICD-10-CM | POA: Diagnosis not present

## 2022-05-03 DIAGNOSIS — E119 Type 2 diabetes mellitus without complications: Secondary | ICD-10-CM | POA: Insufficient documentation

## 2022-05-03 DIAGNOSIS — Z79899 Other long term (current) drug therapy: Secondary | ICD-10-CM | POA: Insufficient documentation

## 2022-05-03 DIAGNOSIS — C7A8 Other malignant neuroendocrine tumors: Secondary | ICD-10-CM | POA: Diagnosis not present

## 2022-05-03 DIAGNOSIS — Z809 Family history of malignant neoplasm, unspecified: Secondary | ICD-10-CM | POA: Insufficient documentation

## 2022-05-03 DIAGNOSIS — I10 Essential (primary) hypertension: Secondary | ICD-10-CM | POA: Insufficient documentation

## 2022-05-03 DIAGNOSIS — Z87891 Personal history of nicotine dependence: Secondary | ICD-10-CM | POA: Insufficient documentation

## 2022-05-03 DIAGNOSIS — Z79890 Hormone replacement therapy (postmenopausal): Secondary | ICD-10-CM | POA: Insufficient documentation

## 2022-05-03 DIAGNOSIS — Z807 Family history of other malignant neoplasms of lymphoid, hematopoietic and related tissues: Secondary | ICD-10-CM | POA: Diagnosis not present

## 2022-05-03 DIAGNOSIS — Z803 Family history of malignant neoplasm of breast: Secondary | ICD-10-CM | POA: Diagnosis not present

## 2022-05-03 DIAGNOSIS — Z8349 Family history of other endocrine, nutritional and metabolic diseases: Secondary | ICD-10-CM | POA: Insufficient documentation

## 2022-05-03 DIAGNOSIS — K760 Fatty (change of) liver, not elsewhere classified: Secondary | ICD-10-CM | POA: Diagnosis not present

## 2022-05-03 DIAGNOSIS — J449 Chronic obstructive pulmonary disease, unspecified: Secondary | ICD-10-CM | POA: Insufficient documentation

## 2022-05-03 DIAGNOSIS — E785 Hyperlipidemia, unspecified: Secondary | ICD-10-CM | POA: Diagnosis not present

## 2022-05-03 LAB — COMPREHENSIVE METABOLIC PANEL
ALT: 22 U/L (ref 0–44)
AST: 33 U/L (ref 15–41)
Albumin: 4 g/dL (ref 3.5–5.0)
Alkaline Phosphatase: 66 U/L (ref 38–126)
Anion gap: 9 (ref 5–15)
BUN: 14 mg/dL (ref 8–23)
CO2: 25 mmol/L (ref 22–32)
Calcium: 9.5 mg/dL (ref 8.9–10.3)
Chloride: 101 mmol/L (ref 98–111)
Creatinine, Ser: 0.64 mg/dL (ref 0.44–1.00)
GFR, Estimated: >60 mL/min (ref >60)
Glucose, Bld: 164 mg/dL — ABNORMAL HIGH (ref 70–99)
Potassium: 3.4 mmol/L — ABNORMAL LOW (ref 3.5–5.1)
Sodium: 135 mmol/L (ref 135–145)
Total Bilirubin: 0.8 mg/dL (ref 0.3–1.2)
Total Protein: 7.7 g/dL (ref 6.5–8.1)

## 2022-05-03 LAB — LACTATE DEHYDROGENASE: LDH: 140 U/L (ref 98–192)

## 2022-05-03 NOTE — Progress Notes (Signed)
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 All three ascending colon polyps were tubular adenoma. 5 sigmoid colon polyps were hyperplastic polyps. One polyp turned out to be well defferentiated neuroendocrine tumor, 2.2m, grade 1.   The tumor was small and the tattoo of the site of tumor removal is not feasible per GI.  Case was discussed on tumor board and recommend surveillance.  02/09/2018 dotatate PET scan was negative Patient had colonoscopy surveillance   # S/p repeat colonoscopy on 08/10/2018. Polyps resected. Pathology negative malignancy.  # Cynthia [name] recently had a CT chest lung cancer screening.  With incidental findings of low attenuation lesion of the left kidney.  03/02/2020 ultrasound kidney showed 2.7 cm solid-appearing lesion of the left kidney which is suspicious for renal cell carcinoma. 03/09/2020 MRI abdomen with and without contrast showed complex cystic lesion arising from the upper pole of the left kidney measuring 2.1 cm.  Compatible with cystic renal cell carcinoma.  Cynthia [name] also has right kidney lesion. Mild hepatic steatosis  # 04/29/2020 s/p image guided left renal mass (T1 lesion) cryoablation by Dr.Hassell.  # 09/25/20 MRI abdomen with and without contrast showed post ablation changes in the left kidney without evidence of residual at  the site of ablation.  Nodular area seen in the right lung base is more focal seen the absence of significant atelectasis measuring approximately 187m.  Family history of breast cancer.  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   Genetic testing showed no pathological mutations.  INTERVAL HISTORY Cynthia [name] a [age].o. 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 Patient reports feeling well.  Cynthia [name] has no complaints. Denies any nausea vomiting, diarrhea, sob, chest pain, abdominal pain. Appetite is fair    Review of Systems  Constitutional:  Negative for chills, fever, malaise/fatigue and weight loss.  HENT:  Negative for sore throat.   Eyes:  Negative for redness.  Respiratory:  Negative for cough, shortness of breath and wheezing.   Cardiovascular:  Negative for chest pain, palpitations and leg swelling.  Gastrointestinal:  Negative for abdominal pain, blood in stool, nausea and vomiting.  Genitourinary:  Negative for dysuria.  Musculoskeletal:  Negative for joint pain and myalgias.  Skin:  Negative for rash.  Neurological:  Negative for dizziness, tingling and tremors.  Endo/Heme/Allergies:  Does not bruise/bleed easily.  Psychiatric/Behavioral:  Negative for hallucinations.     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   Family history of breast  cancer    Family history of lymphoma    Family history of prostate cancer    GERD (gastroesophageal reflux disease)    Hyperlipidemia    Hypertension    Hypothyroidism    Lung nodule    Thyroid disease     SURGICAL HISTORY: Past Surgical History:  Procedure  Laterality Date   BREAST BIOPSY Left 11/25/2015   stereo  neg   BREAST EXCISIONAL BIOPSY Left yrs ago   benign   COLONOSCOPY WITH PROPOFOL N/A 04/20/2017   Procedure: COLONOSCOPY WITH PROPOFOL;  Surgeon: [name], MD;  Location: [facility] ENDOSCOPY;  Service: Endoscopy;  Laterality: N/A;   COLONOSCOPY WITH PROPOFOL N/A 01/08/2018   Procedure: COLONOSCOPY WITH PROPOFOL;  Surgeon: [name], MD;  Location: [facility] ENDOSCOPY;  Service: Gastroenterology;  Laterality: N/A;   COLONOSCOPY WITH PROPOFOL N/A 08/10/2018   Procedure: COLONOSCOPY WITH PROPOFOL;  Surgeon: [name], MD;  Location: [facility] ENDOSCOPY;  Service: Gastroenterology;  Laterality: N/A;   COLONOSCOPY WITH PROPOFOL N/A 02/08/2021   Procedure: COLONOSCOPY WITH PROPOFOL;  Surgeon: [name], MD;  Location: [facility] ENDOSCOPY;  Service: Gastroenterology;  Laterality: N/A;   IR RADIOLOGIST EVAL & MGMT  04/08/2020   IR RADIOLOGIST EVAL & MGMT  05/26/2020   IR RADIOLOGIST EVAL & MGMT  09/29/2020   IR RADIOLOGIST EVAL & MGMT  03/10/2021   IR RADIOLOGIST EVAL & MGMT  12/10/2021   RADIOLOGY WITH ANESTHESIA N/A 04/29/2020   Procedure: CRYOABLATION;  Surgeon: Arne Cleveland, MD;  Location: [facility] ORS;  Service: Radiology;  Laterality: N/A;   THYROIDECTOMY     Dr. [name]   VAGINAL DELIVERY     4    SOCIAL HISTORY: Social History   Socioeconomic History   Marital status: Married    Spouse name: Not on file   Number of children: 4   Years of education: Not on file   Highest education level: Not on file  Occupational History   Not on file  Tobacco Use   Smoking status: Former    Packs/day: 0.75    Years: 48.00    Total pack years: 36.00    Types: Cigarettes    Quit date: 2015    Years since quitting: 8.6   Smokeless tobacco: Former    Types: Snuff, Chew  Vaping Use   Vaping Use: Never used  Substance and Sexual Activity   Alcohol use: No   Drug use: No   Sexual activity: Not Currently  Other Topics Concern   Not on file  Social History  Narrative   Lives in [place] with husband. Has 4 children.      4 grandchildren.      Work - Retired from [employer]- walking the track, 4x per week      Diet- regular      Social Determinants of Health   Financial Resource Strain: Low Risk  (04/21/2022)   Overall Financial Resource Strain (CARDIA)    Difficulty of Paying Living Expenses: Not hard at all  Food Insecurity: No Food Insecurity (04/21/2022)   Hunger Vital Sign    Worried About Running Out of Food in the Last Year: Never true    [place] in the Last Year: Never true  Transportation Needs: No Transportation Needs (04/21/2022)   PRAPARE - Hydrologist (Medical): No    Lack of Transportation (Non-Medical): No  Physical Activity: Sufficiently Active (04/21/2022)   Exercise Vital Sign  Days of Exercise per Week: 5 days    Minutes of Exercise per Session: 30 min  Stress: No Stress Concern Present (04/21/2022)   [place]    Feeling of Stress : Not at all  Social Connections: Socially Integrated (04/21/2022)   Social Connection and Isolation Panel [NHANES]    Frequency of Communication with Friends and Family: More than three times a week    Frequency of Social Gatherings with Friends and Family: Once a week    Attends Religious Services: More than 4 times per year    Active Member of [employer] or Organizations: Yes    Attends Archivist Meetings: More than 4 times per year    Marital Status: Married  Human resources officer Violence: Not At Risk (04/21/2022)   Humiliation, Afraid, Rape, and Kick questionnaire    Fear of Current or Ex-Partner: No    Emotionally Abused: No    Physically Abused: No    Sexually Abused: No    FAMILY HISTORY: Family History  Problem Relation Age of Onset   Hypertension Mother    Hypertension Father    Prostate cancer Father 45   Diabetes Sister    Thyroid disease Sister     Breast cancer Sister 8   Breast cancer Sister 81   Lymphoma Sister 88   Breast cancer Maternal Aunt    Breast cancer Maternal Aunt    Cancer Paternal Grandmother        unk type    ALLERGIES:  has No Known Allergies.  MEDICATIONS:  Current Outpatient Medications  Medication Sig Dispense Refill   albuterol (VENTOLIN HFA) 108 (90 Base) MCG/ACT inhaler Inhale 2 puffs into the lungs every 6 (six) hours as needed for wheezing or shortness of breath. 8 g 1   amLODipine (NORVASC) 10 MG tablet TAKE 1 TABLET BY MOUTH EVERY DAY 90 tablet 3   aspirin EC 81 MG tablet Take 81 mg by mouth daily. Swallow whole.     atorvastatin (LIPITOR) 20 MG tablet TAKE 1 TABLET BY MOUTH EVERY DAY 90 tablet 0   blood glucose meter kit and supplies KIT Dispense based on patient and insurance preference. Use up to four times daily as directed. 1 each 0   Calcium Carbonate-Vit D-Min (CALCIUM 600+D PLUS MINERALS) 600-400 MG-UNIT TABS Take 1 tablet by mouth daily.      diclofenac sodium (VOLTAREN) 1 % GEL Apply 1 application topically 4 (four) times daily as needed (knee pain.).      DULoxetine (CYMBALTA) 60 MG capsule TAKE 1 CAPSULE BY MOUTH EVERY DAY 90 capsule 3   Lancet Devices (ONE TOUCH DELICA LANCING DEV) MISC 1 Device by Does not apply route daily. 1 each 2   Lancets (ONETOUCH DELICA PLUS [id number]) MISC USE AS INSTRUCTED 100 each 12   levothyroxine (SYNTHROID) 137 MCG tablet TAKE 1 TABLET BY MOUTH EVERY DAY BEFORE BREAKFAST 90 tablet 1   losartan-hydrochlorothiazide (HYZAAR) 50-12.5 MG tablet TAKE 1 TABLET BY MOUTH EVERY DAY 90 tablet 1   metFORMIN (GLUCOPHAGE) 500 MG tablet TAKE 1 TABLET BY MOUTH TWICE A DAY 180 tablet 1   metoprolol tartrate (LOPRESSOR) 25 MG tablet Take 1 tablet (25 mg total) by mouth 2 (two) times daily. 90 tablet 1   ONETOUCH ULTRA test strip TEST BLOOD SUGAR 1-2 TIMES A DAY 100 strip 5   No current facility-administered medications for this visit.     PHYSICAL EXAMINATION: ECOG  PERFORMANCE STATUS: 0 - Asymptomatic  Vitals:   05/03/22 0959  BP: 113/74  Pulse: 73  Temp: (!) 97.1 F (36.2 C)   Filed Weights   05/03/22 0959  Weight: 227 lb (103 kg)    Physical Exam Constitutional:      General: Cynthia [name] is not in acute distress.    Appearance: Cynthia [name] is well-developed. Cynthia [name] is obese.  HENT:     Head: Normocephalic and atraumatic.  Eyes:     General: No scleral icterus.    Pupils: Pupils are equal, round, and reactive to light.  Cardiovascular:     Rate and Rhythm: Normal rate and regular rhythm.  Pulmonary:     Effort: Pulmonary effort is normal. No respiratory distress.     Breath sounds: Normal breath sounds. No wheezing.  Abdominal:     General: Bowel sounds are normal. There is no distension.     Palpations: Abdomen is soft.     Tenderness: There is no abdominal tenderness.  Musculoskeletal:        General: No deformity. Normal range of motion.     Cervical back: Normal range of motion and neck supple.  Lymphadenopathy:     Cervical: No cervical adenopathy.  Skin:    General: Skin is warm and dry.     Findings: No erythema or rash.  Neurological:     Mental Status: Cynthia [name] is alert and oriented to person, place, and time. Mental status is at baseline.     Cranial Nerves: No cranial nerve deficit.     Coordination: Coordination normal.  Psychiatric:        Mood and Affect: Mood normal.      LABORATORY DATA:  I have reviewed the data as listed    Latest Ref Rng & Units 04/07/2022   10:26 AM 02/01/2022   10:09 AM 12/15/2021   10:40 AM  CBC  WBC 4.0 - 10.5 K/uL 3.4  3.7  4.6   Hemoglobin 12.0 - 15.0 g/dL 13.6  13.5  13.6   Hematocrit 36.0 - 46.0 % 41.3  40.0  40.3   Platelets 150.0 - 400.0 K/uL 280.0  261.0  274.0       Latest Ref Rng & Units 05/03/2022    9:40 AM 12/15/2021   10:40 AM 11/03/2021   10:42 AM  CMP  Glucose 70 - 99 mg/dL 164  115  117   BUN 8 - 23 mg/dL 14  11  10    Creatinine 0.44 - 1.00 mg/dL 0.64  0.67  0.61   Sodium 135 - 145  mmol/L 135  138  134   Potassium 3.5 - 5.1 mmol/L 3.4  4.1  3.5   Chloride 98 - 111 mmol/L 101  101  99   CO2 22 - 32 mmol/L 25  26  28    Calcium 8.9 - 10.3 mg/dL 9.5  9.9  9.5   Total Protein 6.5 - 8.1 g/dL 7.7  6.8  7.8   Total Bilirubin 0.3 - 1.2 mg/dL 0.8  0.5  0.1   Alkaline Phos 38 - 126 U/L 66  69  72   AST 15 - 41 U/L 33  20  25   ALT 0 - 44 U/L 22  20  19     ASSESSMENT & PLAN:  1. Former smoker   2. Neuroendocrine carcinoma of colon ([place])   3. 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  S/p cryoablation.   03/08/2021 MRI abdomen with and without contrast showed post ablation changes in the left kidney without evidence of residual or recurrent disease at the site of ablation. 11/23/21  MR abdomen w wo contrast showed Contraction of the ablation site in the medial LEFT kidney compared to MRI 03/08/2021. No significant enhancement or nodularity present to suggest recurrence. No new lesions in the kidneys. No lymphadenopathy. Dr.Hassell recommend to repeat MRI in 1 year-March 2024. Marland Kitchen Former smoker, refer to lung cancer screening program.  11/10/2020 CT chest without contrast showed unchanged pulmonary nodules.  Continue annual CT lung cancer screening.-Patient is overdue.  I will obtain.    Follow-up in 6 months   All questions were answered. The patient knows to call the clinic with any problems questions or concerns.  Earlie Server, MD, PhD Hematology Oncology  05/03/2022

## 2022-05-05 LAB — CHROMOGRANIN A: Chromogranin A (ng/mL): 54.2 ng/mL (ref 0.0–101.8)

## 2022-05-06 ENCOUNTER — Telehealth: Payer: Self-pay

## 2022-05-06 NOTE — Telephone Encounter (Signed)
LVM to call back to get results

## 2022-05-11 ENCOUNTER — Other Ambulatory Visit: Payer: Self-pay

## 2022-05-11 DIAGNOSIS — E785 Hyperlipidemia, unspecified: Secondary | ICD-10-CM | POA: Diagnosis not present

## 2022-05-11 DIAGNOSIS — I10 Essential (primary) hypertension: Secondary | ICD-10-CM | POA: Diagnosis not present

## 2022-05-11 DIAGNOSIS — C7A8 Other malignant neuroendocrine tumors: Secondary | ICD-10-CM | POA: Diagnosis not present

## 2022-05-11 DIAGNOSIS — J449 Chronic obstructive pulmonary disease, unspecified: Secondary | ICD-10-CM | POA: Diagnosis not present

## 2022-05-11 DIAGNOSIS — N289 Disorder of kidney and ureter, unspecified: Secondary | ICD-10-CM | POA: Diagnosis not present

## 2022-05-11 DIAGNOSIS — Z833 Family history of diabetes mellitus: Secondary | ICD-10-CM | POA: Diagnosis not present

## 2022-05-11 DIAGNOSIS — Z809 Family history of malignant neoplasm, unspecified: Secondary | ICD-10-CM | POA: Diagnosis not present

## 2022-05-11 DIAGNOSIS — E119 Type 2 diabetes mellitus without complications: Secondary | ICD-10-CM | POA: Diagnosis not present

## 2022-05-11 DIAGNOSIS — K760 Fatty (change of) liver, not elsewhere classified: Secondary | ICD-10-CM | POA: Diagnosis not present

## 2022-05-11 DIAGNOSIS — Z8249 Family history of ischemic heart disease and other diseases of the circulatory system: Secondary | ICD-10-CM | POA: Diagnosis not present

## 2022-05-11 DIAGNOSIS — Z87891 Personal history of nicotine dependence: Secondary | ICD-10-CM | POA: Diagnosis not present

## 2022-05-11 DIAGNOSIS — Z807 Family history of other malignant neoplasms of lymphoid, hematopoietic and related tissues: Secondary | ICD-10-CM | POA: Diagnosis not present

## 2022-05-11 DIAGNOSIS — Z803 Family history of malignant neoplasm of breast: Secondary | ICD-10-CM | POA: Diagnosis not present

## 2022-05-11 DIAGNOSIS — Z8349 Family history of other endocrine, nutritional and metabolic diseases: Secondary | ICD-10-CM | POA: Diagnosis not present

## 2022-05-11 DIAGNOSIS — Z79899 Other long term (current) drug therapy: Secondary | ICD-10-CM | POA: Diagnosis not present

## 2022-05-12 ENCOUNTER — Other Ambulatory Visit: Payer: Self-pay | Admitting: Family

## 2022-05-12 DIAGNOSIS — E039 Hypothyroidism, unspecified: Secondary | ICD-10-CM

## 2022-05-17 LAB — 5 HIAA, QUANTITATIVE, URINE, 24 HOUR
5-HIAA, Ur: 2 mg/L
5-HIAA,Quant.,24 Hr Urine: 6 mg/24 hr (ref 0.0–14.9)
Total Volume: 3000

## 2022-05-19 ENCOUNTER — Ambulatory Visit: Payer: Medicare Other | Admitting: Podiatry

## 2022-05-19 ENCOUNTER — Encounter: Payer: Self-pay | Admitting: Podiatry

## 2022-05-19 DIAGNOSIS — I10 Essential (primary) hypertension: Secondary | ICD-10-CM | POA: Diagnosis not present

## 2022-05-19 DIAGNOSIS — B351 Tinea unguium: Secondary | ICD-10-CM

## 2022-05-19 DIAGNOSIS — M79675 Pain in left toe(s): Secondary | ICD-10-CM

## 2022-05-19 DIAGNOSIS — N1831 Chronic kidney disease, stage 3a: Secondary | ICD-10-CM | POA: Diagnosis not present

## 2022-05-19 DIAGNOSIS — E1122 Type 2 diabetes mellitus with diabetic chronic kidney disease: Secondary | ICD-10-CM | POA: Diagnosis not present

## 2022-05-19 DIAGNOSIS — C642 Malignant neoplasm of left kidney, except renal pelvis: Secondary | ICD-10-CM | POA: Diagnosis not present

## 2022-05-19 DIAGNOSIS — K219 Gastro-esophageal reflux disease without esophagitis: Secondary | ICD-10-CM | POA: Diagnosis not present

## 2022-05-19 DIAGNOSIS — M79674 Pain in right toe(s): Secondary | ICD-10-CM

## 2022-05-19 DIAGNOSIS — E1140 Type 2 diabetes mellitus with diabetic neuropathy, unspecified: Secondary | ICD-10-CM | POA: Diagnosis not present

## 2022-05-19 DIAGNOSIS — E871 Hypo-osmolality and hyponatremia: Secondary | ICD-10-CM | POA: Diagnosis not present

## 2022-05-19 DIAGNOSIS — R809 Proteinuria, unspecified: Secondary | ICD-10-CM | POA: Diagnosis not present

## 2022-05-19 DIAGNOSIS — J449 Chronic obstructive pulmonary disease, unspecified: Secondary | ICD-10-CM | POA: Diagnosis not present

## 2022-05-19 DIAGNOSIS — R609 Edema, unspecified: Secondary | ICD-10-CM | POA: Diagnosis not present

## 2022-05-19 NOTE — Progress Notes (Signed)

## 2022-05-20 ENCOUNTER — Other Ambulatory Visit: Payer: Self-pay | Admitting: Family

## 2022-05-20 DIAGNOSIS — E785 Hyperlipidemia, unspecified: Secondary | ICD-10-CM

## 2022-05-21 ENCOUNTER — Other Ambulatory Visit: Payer: Self-pay | Admitting: Family

## 2022-05-21 DIAGNOSIS — I10 Essential (primary) hypertension: Secondary | ICD-10-CM

## 2022-05-24 ENCOUNTER — Ambulatory Visit
Admission: RE | Admit: 2022-05-24 | Discharge: 2022-05-24 | Disposition: A | Discharge status: Home / Self Care | Payer: Medicare Other | Source: Ambulatory Visit | Attending: Acute Care | Admitting: Acute Care

## 2022-05-24 DIAGNOSIS — Z122 Encounter for screening for malignant neoplasm of respiratory organs: Secondary | ICD-10-CM | POA: Insufficient documentation

## 2022-05-24 DIAGNOSIS — Z87891 Personal history of nicotine dependence: Secondary | ICD-10-CM | POA: Insufficient documentation

## 2022-05-25 ENCOUNTER — Other Ambulatory Visit: Payer: Self-pay

## 2022-05-25 DIAGNOSIS — Z122 Encounter for screening for malignant neoplasm of respiratory organs: Secondary | ICD-10-CM

## 2022-05-25 DIAGNOSIS — Z87891 Personal history of nicotine dependence: Secondary | ICD-10-CM

## 2022-05-27 ENCOUNTER — Other Ambulatory Visit: Payer: Self-pay

## 2022-05-27 ENCOUNTER — Telehealth: Payer: Self-pay | Admitting: Family

## 2022-05-27 DIAGNOSIS — J441 Chronic obstructive pulmonary disease with (acute) exacerbation: Secondary | ICD-10-CM

## 2022-05-27 MED ORDER — ALBUTEROL SULFATE HFA 108 (90 BASE) MCG/ACT IN AERS: 2.0000 | INHALATION_SPRAY | Freq: Four times a day (QID) | RESPIRATORY_TRACT | 1 refills | Status: DC | PRN

## 2022-05-27 NOTE — Telephone Encounter (Signed)
Pt need a refill on albuterol sent to [facility]

## 2022-05-27 NOTE — Telephone Encounter (Signed)
Refill sent and patient has been notified

## 2022-05-31 DIAGNOSIS — E119 Type 2 diabetes mellitus without complications: Secondary | ICD-10-CM | POA: Diagnosis not present

## 2022-05-31 LAB — HM DIABETES EYE EXAM

## 2022-06-02 NOTE — Progress Notes (Signed)
abstract

## 2022-06-06 ENCOUNTER — Ambulatory Visit (INDEPENDENT_AMBULATORY_CARE_PROVIDER_SITE_OTHER): Payer: Medicare Other

## 2022-06-06 ENCOUNTER — Telehealth: Payer: Self-pay | Admitting: Family

## 2022-06-06 ENCOUNTER — Encounter: Payer: Self-pay | Admitting: Family

## 2022-06-06 ENCOUNTER — Other Ambulatory Visit: Payer: Self-pay | Admitting: Family

## 2022-06-06 ENCOUNTER — Ambulatory Visit (INDEPENDENT_AMBULATORY_CARE_PROVIDER_SITE_OTHER): Payer: Medicare Other | Admitting: Family

## 2022-06-06 VITALS — BP 130/82 | HR 72 | Temp 98.2°F | Ht 68.0 in | Wt 228.4 lb

## 2022-06-06 DIAGNOSIS — J441 Chronic obstructive pulmonary disease with (acute) exacerbation: Secondary | ICD-10-CM | POA: Diagnosis not present

## 2022-06-06 DIAGNOSIS — R059 Cough, unspecified: Secondary | ICD-10-CM | POA: Diagnosis not present

## 2022-06-06 DIAGNOSIS — Z794 Long term (current) use of insulin: Secondary | ICD-10-CM

## 2022-06-06 DIAGNOSIS — I700 Atherosclerosis of aorta: Secondary | ICD-10-CM

## 2022-06-06 DIAGNOSIS — N289 Disorder of kidney and ureter, unspecified: Secondary | ICD-10-CM | POA: Diagnosis not present

## 2022-06-06 DIAGNOSIS — R9389 Abnormal findings on diagnostic imaging of other specified body structures: Secondary | ICD-10-CM | POA: Diagnosis not present

## 2022-06-06 DIAGNOSIS — E1140 Type 2 diabetes mellitus with diabetic neuropathy, unspecified: Secondary | ICD-10-CM

## 2022-06-06 LAB — POCT GLYCOSYLATED HEMOGLOBIN (HGB A1C): Hemoglobin A1C: 6.4 % — AB (ref 4.0–5.6)

## 2022-06-06 MED ORDER — AMOXICILLIN-POT CLAVULANATE 875-125 MG PO TABS: 1.0000 | ORAL_TABLET | Freq: Two times a day (BID) | ORAL | 0 refills | Status: DC

## 2022-06-06 MED ORDER — BENZONATATE 100 MG PO CAPS: 100.0000 mg | ORAL_CAPSULE | Freq: Three times a day (TID) | ORAL | 1 refills | Status: DC | PRN

## 2022-06-06 MED ORDER — GUAIFENESIN ER 600 MG PO TB12
1200.0000 mg | ORAL_TABLET | Freq: Two times a day (BID) | ORAL | 1 refills | Status: DC
Start: 2022-06-06 — End: 2022-07-26

## 2022-06-06 NOTE — Telephone Encounter (Signed)
Discussed OV 06/06/22

## 2022-06-06 NOTE — Telephone Encounter (Signed)
Call cardiology, Dr Clayborn Bigness  Pt sees him 06/09/22  Please inform his nurse of her recent CT chest  05/24/22   Coronary artery calcification evident.  Mild atherosclerotic calcification thoracic aorta.  Please fax CT Chest to his attention so he can review with pt when he sees her and determine if further evaluation needed including CT calcium score.

## 2022-06-06 NOTE — Progress Notes (Signed)
Subjective:    Patient ID: Cynthia [name], female    DOB: 16-Nov-1945, 76 y.o.   MRN: [id number]  CC: [name] is a 76 y.o. female who presents today for an acute visit.    HPI: Complains of productive cough and increased sputum , x 7 days, unchanged.  She is using albuterol once per day, usually at night.  Occasional wheeze, nasal congestion.    No sob, fever, cp, sinus pain, ear pain, ha, leg swelling    Former smoker in 2015 No abx in 3 months  CT chest 05/24/22  Heart size normal.  Coronary artery calcification evident.  Mild atherosclerotic calcification thoracic aorta.  No lymphadenopathy.  Emphysema evident.  Scattered bilateral pulmonary nodules stable in the interval.  No suspicious pulmonary nodule or mass.  No pleural effusion.  Fullness noted right intrarenal collecting system, similar to prior  MRI abdomen 11/23/2021  Status post ablation medial left kidney.  No significant enhancement.  No new lesions in the kidneys.  LDL 65 , 5 months ago  Follow-up, Dr. Lanora Manis  nephrology 05/19/2022  Follow-up Dr. Clayborn Bigness, cardiology 12/01/2021 for coronary artery disease.  Follow-up 06/09/2022.  Previous stress test in 10/27/2016- normal.   US renal 07/2020:  Right Kidney:   Renal measurements: 12.8 x 6.2 x 6.4 cm = volume: 262 mL. 2.4 cm cyst in the midpole. Normal echotexture. No hydronephrosis.   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 denies   Diabetes mellitus without complication ([place])    type 2   Family history of breast cancer    Family history of lymphoma    Family history of prostate cancer    GERD (gastroesophageal reflux disease)    Hyperlipidemia    Hypertension    Hypothyroidism    Lung nodule    Thyroid disease    Past Surgical History:  Procedure Laterality Date    BREAST BIOPSY Left 11/25/2015   stereo  neg   BREAST EXCISIONAL BIOPSY Left yrs ago   benign   COLONOSCOPY WITH PROPOFOL N/A 04/20/2017   Procedure: COLONOSCOPY WITH PROPOFOL;  Surgeon: [name], MD;  Location: [facility] ENDOSCOPY;  Service: Endoscopy;  Laterality: N/A;   COLONOSCOPY WITH PROPOFOL N/A 01/08/2018   Procedure: COLONOSCOPY WITH PROPOFOL;  Surgeon: [name], MD;  Location: [facility] ENDOSCOPY;  Service: Gastroenterology;  Laterality: N/A;   COLONOSCOPY WITH PROPOFOL N/A 08/10/2018   Procedure: COLONOSCOPY WITH PROPOFOL;  Surgeon: [name], MD;  Location: [facility] ENDOSCOPY;  Service: Gastroenterology;  Laterality: N/A;   COLONOSCOPY WITH PROPOFOL N/A 02/08/2021   Procedure: COLONOSCOPY WITH PROPOFOL;  Surgeon: [name], MD;  Location: [facility] ENDOSCOPY;  Service: Gastroenterology;  Laterality: N/A;   IR RADIOLOGIST EVAL & MGMT  04/08/2020   IR RADIOLOGIST EVAL & MGMT  05/26/2020   IR RADIOLOGIST EVAL & MGMT  09/29/2020   IR RADIOLOGIST EVAL & MGMT  03/10/2021   IR RADIOLOGIST EVAL & MGMT  12/10/2021   RADIOLOGY WITH ANESTHESIA N/A 04/29/2020   Procedure: CRYOABLATION;  Surgeon: Arne Cleveland, MD;  Location: [facility] ORS;  Service: Radiology;  Laterality: N/A;   THYROIDECTOMY     Dr. [name]   VAGINAL DELIVERY     4   Family History  Problem Relation Age of Onset   Hypertension Mother  Hypertension Father    Prostate cancer Father 72   Diabetes Sister    Thyroid disease Sister    Breast cancer Sister 2   Breast cancer Sister 13   Lymphoma Sister 81   Breast cancer Maternal Aunt    Breast cancer Maternal Aunt    Cancer Paternal Grandmother        unk type    Allergies: Patient has no known allergies. Current Outpatient Medications on File Prior to Visit  Medication Sig Dispense Refill   albuterol (VENTOLIN HFA) 108 (90 Base) MCG/ACT inhaler Inhale 2 puffs into the lungs every 6 (six) hours as needed for wheezing or shortness of breath. 8 g 1   amLODipine (NORVASC) 10 MG tablet  TAKE 1 TABLET BY MOUTH EVERY DAY 90 tablet 3   aspirin EC 81 MG tablet Take 81 mg by mouth daily. Swallow whole.     atorvastatin (LIPITOR) 20 MG tablet TAKE 1 TABLET BY MOUTH EVERY DAY 90 tablet 0   blood glucose meter kit and supplies KIT Dispense based on patient and insurance preference. Use up to four times daily as directed. 1 each 0   Calcium Carbonate-Vit D-Min (CALCIUM 600+D PLUS MINERALS) 600-400 MG-UNIT TABS Take 1 tablet by mouth daily.      diclofenac sodium (VOLTAREN) 1 % GEL Apply 1 application topically 4 (four) times daily as needed (knee pain.).      DULoxetine (CYMBALTA) 60 MG capsule TAKE 1 CAPSULE BY MOUTH EVERY DAY 90 capsule 3   Lancet Devices (ONE TOUCH DELICA LANCING DEV) MISC 1 Device by Does not apply route daily. 1 each 2   Lancets (ONETOUCH DELICA PLUS [id number]) MISC USE AS INSTRUCTED 100 each 12   levothyroxine (SYNTHROID) 137 MCG tablet TAKE 1 TABLET BY MOUTH EVERY DAY BEFORE BREAKFAST 90 tablet 1   losartan-hydrochlorothiazide (HYZAAR) 50-12.5 MG tablet TAKE 1 TABLET BY MOUTH EVERY DAY 90 tablet 1   metFORMIN (GLUCOPHAGE) 500 MG tablet TAKE 1 TABLET BY MOUTH TWICE A DAY 180 tablet 1   metoprolol tartrate (LOPRESSOR) 25 MG tablet Take 1 tablet (25 mg total) by mouth 2 (two) times daily. 90 tablet 1   ONETOUCH ULTRA test strip TEST BLOOD SUGAR 1-2 TIMES A DAY 100 strip 5   No current facility-administered medications on file prior to visit.    Social History   Tobacco Use   Smoking status: Former    Packs/day: 0.75    Years: 48.00    Total pack years: 36.00    Types: Cigarettes    Quit date: 2015    Years since quitting: 8.7   Smokeless tobacco: Former    Types: Snuff, Chew  Vaping Use   Vaping Use: Never used  Substance Use Topics   Alcohol use: No   Drug use: No    Review of Systems  Constitutional:  Negative for chills and fever.  HENT:  Positive for congestion.   Respiratory:  Positive for cough and wheezing. Negative for shortness of breath.    Cardiovascular:  Negative for chest pain, palpitations and leg swelling.  Gastrointestinal:  Negative for nausea and vomiting.      Objective:    BP 130/82 (BP Location: Left Arm, Patient Position: Sitting, Cuff Size: Normal)   Pulse 72   Temp 98.2 F (36.8 C) (Oral)   Ht 5' 8"  (1.727 m)   Wt 228 lb 6.4 oz (103.6 kg)   SpO2 97%   BMI 34.73 kg/m    Physical Exam Vitals reviewed.  Constitutional:      Appearance: She is well-developed.  HENT:     Head: Normocephalic and atraumatic.     Right Ear: Hearing, tympanic membrane, ear canal and external ear normal. No decreased hearing noted. No drainage, swelling or tenderness. No middle ear effusion. No foreign body. Tympanic membrane is not erythematous or bulging.     Left Ear: Hearing, tympanic membrane, ear canal and external ear normal. No decreased hearing noted. No drainage, swelling or tenderness.  No middle ear effusion. No foreign body. Tympanic membrane is not erythematous or bulging.     Nose: Nose normal. No rhinorrhea.     Right Sinus: No maxillary sinus tenderness or frontal sinus tenderness.     Left Sinus: No maxillary sinus tenderness or frontal sinus tenderness.     Mouth/Throat:     Pharynx: Uvula midline. No oropharyngeal exudate or posterior oropharyngeal erythema.     Tonsils: No tonsillar abscesses.  Eyes:     Conjunctiva/sclera: Conjunctivae normal.  Cardiovascular:     Rate and Rhythm: Regular rhythm.     Pulses: Normal pulses.     Heart sounds: Normal heart sounds.  Pulmonary:     Effort: Pulmonary effort is normal.     Breath sounds: Normal breath sounds. No wheezing, rhonchi or rales.  Musculoskeletal:     Right lower leg: No edema.     Left lower leg: No edema.  Lymphadenopathy:     Head:     Right side of head: No submental, submandibular, tonsillar, preauricular, posterior auricular or occipital adenopathy.     Left side of head: No submental, submandibular, tonsillar, preauricular, posterior  auricular or occipital adenopathy.     Cervical: No cervical adenopathy.  Skin:    General: Skin is warm and dry.  Neurological:     Mental Status: She is alert.  Psychiatric:        Speech: Speech normal.        Behavior: Behavior normal.        Thought Content: Thought content normal.        Assessment & Plan:   Problem List Items Addressed This Visit       Cardiovascular and Mediastinum   Atherosclerosis of aorta (HCC)    Chronic, stable. Seen CT chest lung cancer program 05/2022. Follows with Dr Clayborn Bigness.  Call out to his office make him aware of atherosclerosis seen on recent CT chest and ensure she does not require further evaluation including CT calcium score.  She is compliant with Lipitor 20 mg.LDL < 70.         Respiratory   COPD exacerbation ([place]) - Primary    Increased sputum and cough.  No acute respiratory distress. Chest x-ray to ensure no underlying pneumonia.  She will start Tessalon, Mucinex and Augmentin.  Encouraged probiotics.  She will let me know how she is doing      Relevant Medications   guaiFENesin (MUCINEX) 600 MG 12 hr tablet   benzonatate (TESSALON) 100 MG capsule   amoxicillin-clavulanate (AUGMENTIN) 875-125 MG tablet   Other Relevant Orders   DG Chest 2 View     Endocrine   Type 2 diabetes mellitus with diabetic neuropathy, unspecified (HCC)    Lab Results  Component Value Date   HGBA1C 6.4 (A) 06/06/2022  Excellent control.  Continue metformin 500 mg twice daily      Relevant Orders   POCT HgB A1C (Completed)     Other   Abnormal CT scan, chest  CT chest 05/24/22:   Fullness noted right intrarenal collecting system, similar to prior.  US renal 07/2020 showed 2.4 cm cyst in the midpole. Normal echotexture. No hydronephrosis.  Pending US renal to clarify fullness, question if r/t previous cyst. Will share result with Dr Lanora Manis to ensure no further evaluation needed        Other Visit Diagnoses     Renal lesion        Relevant Orders   US Renal         I am having Enid Derry A. Keehan start on guaiFENesin, benzonatate, and amoxicillin-clavulanate. I am also having her maintain her Calcium 600+D Plus Minerals, diclofenac sodium, metoprolol tartrate, aspirin EC, OneTouch Delica Plus [id number], blood glucose meter kit and supplies, ONE TOUCH DELICA LANCING DEV, OneTouch Ultra, metFORMIN, losartan-hydrochlorothiazide, DULoxetine, levothyroxine, atorvastatin, amLODipine, and albuterol.   Meds ordered this encounter  Medications   guaiFENesin (MUCINEX) 600 MG 12 hr tablet    Sig: Take 2 tablets (1,200 mg total) by mouth 2 (two) times daily.    Dispense:  28 tablet    Refill:  1    Order Specific Question:   Supervising Provider    Answer:   Deborra Medina L [[id number]]   benzonatate (TESSALON) 100 MG capsule    Sig: Take 1 capsule (100 mg total) by mouth 3 (three) times daily as needed for cough.    Dispense:  20 capsule    Refill:  1    Order Specific Question:   Supervising Provider    Answer:   Deborra Medina L [[id number]]   amoxicillin-clavulanate (AUGMENTIN) 875-125 MG tablet    Sig: Take 1 tablet by mouth 2 (two) times daily for 7 days.    Dispense:  14 tablet    Refill:  0    Order Specific Question:   Supervising Provider    Answer:   Crecencio Mc [[id number]]    Return precautions given.   Risks, benefits, and alternatives of the medications and treatment plan prescribed today were discussed, and patient expressed understanding.   Education regarding symptom management and diagnosis given to patient on AVS.  Continue to follow with Burnard Hawthorne, FNP for routine health maintenance.   Cynthia [name] and I agreed with plan.   Mable Paris, FNP

## 2022-06-06 NOTE — Patient Instructions (Signed)
I have ordered ultrasound of your kidneys to further investigate right kidney fullness as discussed on CT chest. Let us know if you dont hear back within a week in regards to an appointment being scheduled.   We are calling cardiology to ensure they are aware of CT chest and cholesterol that was seen as discussed as well today   For cough, you may start Tessalon Perles as needed for cough.  Mucinex is an expectorant which will help break up thick mucus.  Please take Mucinex with plenty of water.  You may start Augmentin which is an antibiotic.  Ensure to take probiotics while on antibiotics and also for 2 weeks after completion. This can either be by eating yogurt daily or taking a probiotic supplement over the counter such as Culturelle.It is important to re-colonize the gut with good bacteria and also to prevent any diarrheal infections associated with antibiotic use.    Please let me know if you are not better

## 2022-06-06 NOTE — Telephone Encounter (Signed)
Rx sent patient informed

## 2022-06-06 NOTE — Assessment & Plan Note (Signed)
Chronic, stable. Seen CT chest lung cancer program 05/2022. Follows with Dr Clayborn Bigness.  Call out to his office make him aware of atherosclerosis seen on recent CT chest and ensure she does not require further evaluation including CT calcium score.  She is compliant with Lipitor 20 mg.LDL < 70.

## 2022-06-06 NOTE — Assessment & Plan Note (Signed)
CT chest 05/24/22:   Fullness noted right intrarenal collecting system, similar to prior.  US renal 07/2020 showed 2.4 cm cyst in the midpole. Normal echotexture. No hydronephrosis.  Pending US renal to clarify fullness, question if r/t previous cyst. Will share result with Dr Lanora Manis to ensure no further evaluation needed

## 2022-06-06 NOTE — Assessment & Plan Note (Signed)
Increased sputum and cough.  No acute respiratory distress. Chest x-ray to ensure no underlying pneumonia.  She will start Tessalon, Mucinex and Augmentin.  Encouraged probiotics.  She will let me know how she is doing

## 2022-06-06 NOTE — Assessment & Plan Note (Signed)
Lab Results  Component Value Date   HGBA1C 6.4 (A) 06/06/2022   Excellent control.  Continue metformin 500 mg twice daily

## 2022-06-07 NOTE — Telephone Encounter (Signed)
Faxed over the CT scan notes to Dr Clayborn Bigness office on 06/07/22

## 2022-06-08 ENCOUNTER — Telehealth: Payer: Self-pay

## 2022-06-08 ENCOUNTER — Other Ambulatory Visit: Payer: Self-pay

## 2022-06-08 DIAGNOSIS — I10 Essential (primary) hypertension: Secondary | ICD-10-CM

## 2022-06-08 DIAGNOSIS — J441 Chronic obstructive pulmonary disease with (acute) exacerbation: Secondary | ICD-10-CM

## 2022-06-08 MED ORDER — LOSARTAN POTASSIUM-HCTZ 50-12.5 MG PO TABS: 1.0000 | ORAL_TABLET | Freq: Every day | ORAL | 3 refills | Status: DC

## 2022-06-08 NOTE — Telephone Encounter (Signed)
Rx sent into pharmacy patient was noified

## 2022-06-08 NOTE — Telephone Encounter (Signed)
Patient states she needs a refill on her losartan-hydrochlorothiazide (HYZAAR) 50-12.5 MG tablet.  Patient states she is calling to confirm that we have sent in the refill for  metFORMIN (GLUCOPHAGE) 500 MG tablet, which we have and she would like to pick them up at the same time.  *Patient states her preferred pharmacy is [facility] in [place].

## 2022-06-09 ENCOUNTER — Telehealth: Payer: Self-pay

## 2022-06-09 DIAGNOSIS — I2510 Atherosclerotic heart disease of native coronary artery without angina pectoris: Secondary | ICD-10-CM | POA: Diagnosis not present

## 2022-06-09 DIAGNOSIS — E1140 Type 2 diabetes mellitus with diabetic neuropathy, unspecified: Secondary | ICD-10-CM | POA: Diagnosis not present

## 2022-06-09 DIAGNOSIS — I700 Atherosclerosis of aorta: Secondary | ICD-10-CM | POA: Diagnosis not present

## 2022-06-09 DIAGNOSIS — M170 Bilateral primary osteoarthritis of knee: Secondary | ICD-10-CM | POA: Diagnosis not present

## 2022-06-09 DIAGNOSIS — Z87891 Personal history of nicotine dependence: Secondary | ICD-10-CM | POA: Diagnosis not present

## 2022-06-09 DIAGNOSIS — N182 Chronic kidney disease, stage 2 (mild): Secondary | ICD-10-CM | POA: Diagnosis not present

## 2022-06-09 DIAGNOSIS — E782 Mixed hyperlipidemia: Secondary | ICD-10-CM | POA: Diagnosis not present

## 2022-06-09 DIAGNOSIS — I10 Essential (primary) hypertension: Secondary | ICD-10-CM | POA: Diagnosis not present

## 2022-06-09 NOTE — Telephone Encounter (Signed)
LMTCB in regards to chest xray results.

## 2022-06-09 NOTE — Telephone Encounter (Signed)
-----   Message from Burnard Hawthorne, [place] sent at 06/07/2022  1:21 PM EDT ----- CALL-  Reassuring chest x-ray without pneumonia.  Coarse markings which I suspect are from COPD flare are noted.   Degenerative changes are also noted at the spine  Let me know if cannot reach patient.

## 2022-06-10 NOTE — Telephone Encounter (Signed)
Spoke to patient in regards to her results and patient verbalized understanding but stated that she is still coughing up yellow phlegm.

## 2022-06-10 NOTE — Telephone Encounter (Signed)
Patient returned office phone call for results 

## 2022-06-13 ENCOUNTER — Telehealth: Payer: Self-pay

## 2022-06-13 DIAGNOSIS — J441 Chronic obstructive pulmonary disease with (acute) exacerbation: Secondary | ICD-10-CM

## 2022-06-13 MED ORDER — AZITHROMYCIN 250 MG PO TABS: ORAL_TABLET | ORAL | 0 refills | Status: AC

## 2022-06-13 NOTE — Telephone Encounter (Signed)
Spoke with pts daughter after I called pt and did not get an answer. Pts daughter confirmed we have the correct phone number for pt I told her I will try to reach her again later in regards to Arnetts question and information:   Burnard Hawthorne, FNP 11 minutes ago (10:31 AM)    Call patient I saw her last week for COPD exacerbation started on antibiotic Augmentin also gave her Tessalon Perles and Mucinex.     Please ask her specifically how she is doing.     Please ensure no  shortness of breath, or fever   If she is felt better on Augmentin, I could extend the dose.  I could also send in an inhaler called Symbicort that can be used for cough and COPD

## 2022-06-13 NOTE — Telephone Encounter (Signed)
Called and spoke with pt. Pt stated she is still congested in her chest. She stated that she has not been taking mucinex because she had a congestion medication on hand but she does not feel as if its working so she is going to get some mucinex soon. Pt states sometimes its in her chest and sometimes its in her nose. Pt states she is not SOB or having any fever. I asked pt has she tested for covid recently pt states she knows for a fact its not covid because she knew how she felt last time she had covid.

## 2022-06-13 NOTE — Telephone Encounter (Signed)
Patient returned your call . She stated she picked the Musinex and she is agreeable with the provider calling in more of the antibiotic.

## 2022-06-13 NOTE — Telephone Encounter (Signed)
Called LMOM to CB

## 2022-06-13 NOTE — Telephone Encounter (Signed)
LMOM to CB in reagrds to [employer], Yvetta Coder, FNP 2 hours ago (2:07 PM)    I tried to call patient to see how she was doing and to discuss neck steps.  It rang and rang and also unable to leave voicemail.  Please circle back with her and see how she is doing.  Would she like to try Mucinex ahead of me trying symbicort inhaler.    I could also extend Augmentin (antibiotic) if she felt she needed a slightly longer course.   Let me know her preference      Note    You  Burnard Hawthorne, FNP 5 hours ago (10:52 AM)    Called and spoke with pt. Pt stated she is still congested in her chest. She stated that she has not been taking mucinex because she had a congestion medication on hand but she does not feel as if its working so she is going to get some mucinex soon. Pt states sometimes its in her chest and sometimes its in her nose. Pt states she is not SOB or having any fever. I asked pt has she tested for covid recently pt states she knows for a fact its not covid because she knew how she felt last time she had covid.      Note

## 2022-06-13 NOTE — Telephone Encounter (Signed)
I spoke with Cynthia [name] whom advised pt wanted to try an antibiotic She has completed augmentin , 7 day course,  She will start mucinex  I sent in zpak.    [name],   Call pt  Ensure she has zpak and continue probiotics  Please ask her to call us if not better

## 2022-06-13 NOTE — Telephone Encounter (Signed)
Call patient I saw her last week for COPD exacerbation started on antibiotic Augmentin also gave her Tessalon Perles and Mucinex.    Please ask her specifically how she is doing.    Please ensure no  shortness of breath, or fever  If she is felt better on Augmentin, I could extend the dose.  I could also send in an inhaler called Symbicort that can be used for cough and COPD

## 2022-06-13 NOTE — Telephone Encounter (Signed)
I tried to call patient to see how she was doing and to discuss neck steps.  It rang and rang and also unable to leave voicemail.  Please circle back with her and see how she is doing.  Would she like to try Mucinex ahead of me trying symbicort inhaler.   I could also extend Augmentin (antibiotic) if she felt she needed a slightly longer course.  Let me know her preference

## 2022-06-14 NOTE — Telephone Encounter (Signed)
LVM to call back to office  

## 2022-06-15 NOTE — Telephone Encounter (Signed)
Call pt  How is congestion?  I had started zpak after augmentin.   Is she feeling better ?  Ensure she is on probiotics.

## 2022-06-16 NOTE — Telephone Encounter (Signed)
Spoke to patient and she stated that she was feeling better still congested but not as bad. Pt stated that she would take medication for a few more days and see if the congestion was getting better the patient stated that if not she would give [place] a call if she felt like she needed something else prescribed. Not taking any probiotics at this time

## 2022-06-20 NOTE — Telephone Encounter (Signed)
I just called patient's house to check on patient and family answered.  Please call patient back as I like to reschedule a follow-up appointment.    I would like to see her in person as I am concerned that she is not getting better.  She is completed Augmentin, azithromycin which are antibiotics.  She is also started Mucinex and [employer].  Please ensure that she is not short of breath, leg swelling, fever  or having any chest pain which would warrant going to emergency room.  Is the wheezing all the time or once in a while? Is she using albuterol and how often?  Would she be willing to go to UC today to be re evaluated as Im worried about wheezing  I can see her Marlana Latus however I dont want her to wait if she has persistent wheezing , sob or leg swelling or fever  I can sent in an inhaler for her to start ahead of our visit once she has been triaged

## 2022-06-20 NOTE — Telephone Encounter (Signed)
Pt called stating she is still not feeling better and she would like more medication called in. Pt is wheezing at night and coughing up phlegm

## 2022-06-20 NOTE — Telephone Encounter (Signed)
LVM to call back.

## 2022-06-21 MED ORDER — BUDESONIDE-FORMOTEROL FUMARATE 80-4.5 MCG/ACT IN AERO: 2.0000 | INHALATION_SPRAY | Freq: Two times a day (BID) | RESPIRATORY_TRACT | 12 refills | Status: DC

## 2022-06-21 NOTE — Telephone Encounter (Signed)
See other note. Spoke to patient and she is scheduled for appt on 06/22/22

## 2022-06-21 NOTE — Telephone Encounter (Signed)
Spoke to patient in regards to message and she stated that she can not get rid of the wheezing but no sob and does not want to go to UC but did agree to come into office for in person visit. Appt scheduled for 06/22/22 at 11:30am

## 2022-06-21 NOTE — Telephone Encounter (Signed)
Spoke to patient and informed her that the rx for Symbicort was sent into the pharmacy and that she could pick it up to use along with her inhaler. But still keep appt for tomorrow! 06/22/22

## 2022-06-21 NOTE — Telephone Encounter (Signed)
I just tried to call pt to check on her  Per our conversation Cynthia [name], she is using albuterol  I sent in symbicort inhaler for her to use IN ADDITION to albuterol every 4-5 hours for wheezing  Symbicort is 2 puffs BID  Reiterate if any cp, sob, she must go to ED today.   I will see her otherwise tomorrow

## 2022-06-21 NOTE — Addendum Note (Signed)
Addended by: Burnard Hawthorne on: 06/21/2022 11:27 AM   Modules accepted: Orders

## 2022-06-22 ENCOUNTER — Telehealth: Payer: Self-pay | Admitting: Family

## 2022-06-22 ENCOUNTER — Encounter: Payer: Self-pay | Admitting: Family

## 2022-06-22 ENCOUNTER — Ambulatory Visit (INDEPENDENT_AMBULATORY_CARE_PROVIDER_SITE_OTHER): Payer: Medicare Other | Admitting: Family

## 2022-06-22 VITALS — BP 114/68 | HR 75 | Temp 98.5°F | Ht 68.0 in | Wt 229.0 lb

## 2022-06-22 DIAGNOSIS — J441 Chronic obstructive pulmonary disease with (acute) exacerbation: Secondary | ICD-10-CM | POA: Diagnosis not present

## 2022-06-22 DIAGNOSIS — R300 Dysuria: Secondary | ICD-10-CM

## 2022-06-22 DIAGNOSIS — R399 Unspecified symptoms and signs involving the genitourinary system: Secondary | ICD-10-CM

## 2022-06-22 DIAGNOSIS — B372 Candidiasis of skin and nail: Secondary | ICD-10-CM | POA: Insufficient documentation

## 2022-06-22 LAB — URINALYSIS, ROUTINE W REFLEX MICROSCOPIC
Bilirubin Urine: NEGATIVE
Hgb urine dipstick: NEGATIVE
Ketones, ur: NEGATIVE
Nitrite: NEGATIVE
RBC / HPF: NONE SEEN (ref 0–?)
Specific Gravity, Urine: 1.01 (ref 1.000–1.030)
Total Protein, Urine: NEGATIVE
Urine Glucose: NEGATIVE
Urobilinogen, UA: 0.2 (ref 0.0–1.0)
pH: 6.5 (ref 5.0–8.0)

## 2022-06-22 LAB — POCT URINALYSIS DIPSTICK
Bilirubin, UA: NEGATIVE
Blood, UA: NEGATIVE
Glucose, UA: NEGATIVE
Ketones, UA: NEGATIVE
Nitrite, UA: NEGATIVE
Protein, UA: NEGATIVE
Spec Grav, UA: 1.02 (ref 1.010–1.025)
Urobilinogen, UA: 0.2 E.U./dL
pH, UA: 6.5 (ref 5.0–8.0)

## 2022-06-22 MED ORDER — PREDNISONE 10 MG PO TABS: ORAL_TABLET | ORAL | 0 refills | Status: DC

## 2022-06-22 NOTE — Progress Notes (Signed)
Subjective:    Patient ID: Cynthia [name], female    DOB: [birth year], 76 y.o.   MRN: [id number]  CC: Cynthia [name] is a 76 y.o. female who presents today for an acute visit.    HPI: Follow-up 06/06/2022 COPD exacerbation.  She has completed Augmentin and azithromycin.  She Symbicort due to cost.  She is using albuterol q6hrs, Mucinex  She continues to cough, more noticeable in the evening and at night when trying to sleep. Thick yellow congestion.  Wheeze at night.   SOB is not increased. No  leg swelling, CP, fever, sinus pressure.  She complains dysuria x 2 days No hematuria.     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 denies   Diabetes mellitus without complication ([place])    type 2   Family history of breast cancer    Family history of lymphoma    Family history of prostate cancer    GERD (gastroesophageal reflux disease)    Hyperlipidemia    Hypertension    Hypothyroidism    Lung nodule    Thyroid disease    Past Surgical History:  Procedure Laterality Date   BREAST BIOPSY Left 11/25/2015   stereo  neg   BREAST EXCISIONAL BIOPSY Left yrs ago   benign   COLONOSCOPY WITH PROPOFOL N/A 04/20/2017   Procedure: COLONOSCOPY WITH PROPOFOL;  Surgeon: [name], MD;  Location: [facility] ENDOSCOPY;  Service: Endoscopy;  Laterality: N/A;   COLONOSCOPY WITH PROPOFOL N/A 01/08/2018   Procedure: COLONOSCOPY WITH PROPOFOL;  Surgeon: [name], MD;  Location: [facility] ENDOSCOPY;  Service: Gastroenterology;  Laterality: N/A;   COLONOSCOPY WITH PROPOFOL N/A 08/10/2018   Procedure: COLONOSCOPY WITH PROPOFOL;  Surgeon: [name], MD;  Location: [facility] ENDOSCOPY;  Service: Gastroenterology;  Laterality: N/A;   COLONOSCOPY WITH PROPOFOL N/A 02/08/2021    Procedure: COLONOSCOPY WITH PROPOFOL;  Surgeon: [name], MD;  Location: [facility] ENDOSCOPY;  Service: Gastroenterology;  Laterality: N/A;   IR RADIOLOGIST EVAL & MGMT  04/08/2020   IR RADIOLOGIST EVAL & MGMT  05/26/2020   IR RADIOLOGIST EVAL & MGMT  09/29/2020   IR RADIOLOGIST EVAL & MGMT  03/10/2021   IR RADIOLOGIST EVAL & MGMT  12/10/2021   RADIOLOGY WITH ANESTHESIA N/A 04/29/2020   Procedure: CRYOABLATION;  Surgeon: Arne Cleveland, MD;  Location: [facility] ORS;  Service: Radiology;  Laterality: N/A;   THYROIDECTOMY     Dr. [name]   VAGINAL DELIVERY     4   Family History  Problem Relation Age of Onset   Hypertension Mother    Hypertension Father    Prostate cancer Father 69   Diabetes Sister    Thyroid disease Sister    Breast cancer Sister 13   Breast cancer Sister 70   Lymphoma Sister 46   Breast cancer Maternal Aunt    Breast cancer Maternal Aunt    Cancer Paternal Grandmother        unk type    Allergies: Patient has no known allergies. Current Outpatient Medications on File Prior to Visit  Medication Sig Dispense Refill   albuterol (VENTOLIN HFA) 108 (90 Base) MCG/ACT inhaler Inhale 2 puffs into the lungs every 6 (six) hours as needed for wheezing or shortness of breath.  8 g 1   amLODipine (NORVASC) 10 MG tablet TAKE 1 TABLET BY MOUTH EVERY DAY 90 tablet 3   aspirin EC 81 MG tablet Take 81 mg by mouth daily. Swallow whole.     atorvastatin (LIPITOR) 20 MG tablet TAKE 1 TABLET BY MOUTH EVERY DAY 90 tablet 0   blood glucose meter kit and supplies KIT Dispense based on patient and insurance preference. Use up to four times daily as directed. 1 each 0   budesonide-formoterol (SYMBICORT) 80-4.5 MCG/ACT inhaler Inhale 2 puffs into the lungs 2 (two) times daily. 1 each 12   Calcium Carbonate-Vit D-Min (CALCIUM 600+D PLUS MINERALS) 600-400 MG-UNIT TABS Take 1 tablet by mouth daily.      diclofenac sodium (VOLTAREN) 1 % GEL Apply 1 application topically 4 (four) times daily as needed (knee  pain.).      DULoxetine (CYMBALTA) 60 MG capsule TAKE 1 CAPSULE BY MOUTH EVERY DAY 90 capsule 3   guaiFENesin (MUCINEX) 600 MG 12 hr tablet Take 2 tablets (1,200 mg total) by mouth 2 (two) times daily. 28 tablet 1   Lancet Devices (ONE TOUCH DELICA LANCING DEV) MISC 1 Device by Does not apply route daily. 1 each 2   Lancets (ONETOUCH DELICA PLUS [id number]) MISC USE AS INSTRUCTED 100 each 12   levothyroxine (SYNTHROID) 137 MCG tablet TAKE 1 TABLET BY MOUTH EVERY DAY BEFORE BREAKFAST 90 tablet 1   losartan-hydrochlorothiazide (HYZAAR) 50-12.5 MG tablet Take 1 tablet by mouth daily. 90 tablet 3   metFORMIN (GLUCOPHAGE) 500 MG tablet TAKE 1 TABLET BY MOUTH TWICE A DAY 180 tablet 1   metoprolol tartrate (LOPRESSOR) 25 MG tablet Take 1 tablet (25 mg total) by mouth 2 (two) times daily. 90 tablet 1   ONETOUCH ULTRA test strip TEST BLOOD SUGAR 1-2 TIMES A DAY 100 strip 5   No current facility-administered medications on file prior to visit.    Social History   Tobacco Use   Smoking status: Former    Packs/day: 0.75    Years: 48.00    Total pack years: 36.00    Types: Cigarettes    Quit date: 2015    Years since quitting: 8.7   Smokeless tobacco: Former    Types: Snuff, Chew  Vaping Use   Vaping Use: Never used  Substance Use Topics   Alcohol use: No   Drug use: No    Review of Systems  Constitutional:  Negative for chills and fever.  HENT:  Positive for congestion. Negative for sinus pressure and sore throat.   Respiratory:  Positive for cough and wheezing.   Cardiovascular:  Negative for chest pain, palpitations and leg swelling.  Gastrointestinal:  Negative for nausea and vomiting.      Objective:    BP 114/68 (BP Location: Left Arm, Patient Position: Sitting, Cuff Size: Large)   Pulse 75   Temp 98.5 F (36.9 C) (Oral)   Ht 5' 8"  (1.727 m)   Wt 229 lb (103.9 kg)   SpO2 98%   BMI 34.82 kg/m    Physical Exam Vitals reviewed.  Constitutional:      Appearance: She is  well-developed.  HENT:     Head: Normocephalic and atraumatic.     Right Ear: Hearing, tympanic membrane, ear canal and external ear normal. No decreased hearing noted. No drainage, swelling or tenderness. No middle ear effusion. No foreign body. Tympanic membrane is not erythematous or bulging.     Left Ear: Hearing, tympanic membrane, ear canal and external  ear normal. No decreased hearing noted. No drainage, swelling or tenderness.  No middle ear effusion. No foreign body. Tympanic membrane is not erythematous or bulging.     Nose: Nose normal. No rhinorrhea.     Right Sinus: No maxillary sinus tenderness or frontal sinus tenderness.     Left Sinus: No maxillary sinus tenderness or frontal sinus tenderness.     Mouth/Throat:     Pharynx: Uvula midline. No oropharyngeal exudate or posterior oropharyngeal erythema.     Tonsils: No tonsillar abscesses.  Eyes:     Conjunctiva/sclera: Conjunctivae normal.  Cardiovascular:     Rate and Rhythm: Regular rhythm.     Pulses: Normal pulses.     Heart sounds: Normal heart sounds.  Pulmonary:     Effort: Pulmonary effort is normal.     Breath sounds: Normal breath sounds. No wheezing, rhonchi or rales.     Comments: Course lung sounds bilaterally lower lobes Lymphadenopathy:     Head:     Right side of head: No submental, submandibular, tonsillar, preauricular, posterior auricular or occipital adenopathy.     Left side of head: No submental, submandibular, tonsillar, preauricular, posterior auricular or occipital adenopathy.     Cervical: No cervical adenopathy.  Skin:    General: Skin is warm and dry.  Neurological:     Mental Status: She is alert.  Psychiatric:        Speech: Speech normal.        Behavior: Behavior normal.        Thought Content: Thought content normal.   Patient felt significantly better after albuterol treatment. Lung sounds clear and increased      Assessment & Plan:   Problem List Items Addressed This Visit        Respiratory   COPD exacerbation ([place])    No acute respiratory distress.   I suspect this is continued COPD exacerbation.  Patient  declines taking nebulizer machine home with her today.  Symbicort inhaler cost $45 and she declines picking this prescription up.  We will try prednisone taper.  We discussed rpeating chest x-ray today to ensure no PNA from prior 06/06/22 CXR which was normal however opted to defer as no adventitious lung sounds after nebulizer.   She has completed Augmentin and azithromycin which would would cover CAP .   She will let me know how she is doing and if cough, congestion persists, we will obtain repeat CXR. Continue Mucinex and albuterol every 6 hours. Start prednisone taper.  She will let me know how she is doing        Other   Dysuria    Duration of 1 day.  Point-of-care urine trace leukocytes, negative nitrites and blood.  We jointly agreed to wait on urine culture prior to antibiotic treatment      Other Visit Diagnoses     UTI symptoms    -  Primary   Relevant Orders   POCT Urinalysis Dipstick (Completed)   Urinalysis, Routine w reflex microscopic   Urine Culture         I have discontinued Enid Derry A. Hinsley's benzonatate. I am also having her maintain her Calcium 600+D Plus Minerals, diclofenac sodium, metoprolol tartrate, aspirin EC, OneTouch Delica Plus [id number], blood glucose meter kit and supplies, ONE TOUCH DELICA LANCING DEV, OneTouch Ultra, DULoxetine, levothyroxine, atorvastatin, amLODipine, albuterol, metFORMIN, guaiFENesin, losartan-hydrochlorothiazide, and budesonide-formoterol.   No orders of the defined types were placed in this encounter.   Return precautions given.  Risks, benefits, and alternatives of the medications and treatment plan prescribed today were discussed, and patient expressed understanding.   Education regarding symptom management and diagnosis given to patient on AVS.  Continue to follow with Burnard Hawthorne, FNP for routine health maintenance.   Cynthia [name] and I agreed with plan.   Mable Paris, FNP

## 2022-06-22 NOTE — Patient Instructions (Addendum)
I have sent in prednisone for you to start tomorrow morning.  Prednisone is stimulating and can interfere with sleep.  Please take all doses in the morning.  Please continue Mucinex and using albuterol every 6 hours.  Please let me know how you are doing.  We will await urine culture which will  take a couple days to grow to ensure that you do not have a urinary tract infection.

## 2022-06-22 NOTE — Assessment & Plan Note (Addendum)
No acute respiratory distress.   I suspect this is continued COPD exacerbation.  Patient  declines taking nebulizer machine home with her today.  Symbicort inhaler cost $45 and she declines picking this prescription up.  We will try prednisone taper.  We discussed rpeating chest x-ray today to ensure no PNA from prior 06/06/22 CXR which was normal however opted to defer as no adventitious lung sounds after nebulizer.   She has completed Augmentin and azithromycin which would would cover CAP .   She will let me know how she is doing and if cough, congestion persists, we will obtain repeat CXR. Continue Mucinex and albuterol every 6 hours. Start prednisone taper.  She will let me know how she is doing

## 2022-06-22 NOTE — Telephone Encounter (Signed)
Patient called and said her pharmacy has not received the  Prednisone prescription from [place].

## 2022-06-22 NOTE — Addendum Note (Signed)
Addended by: Burnard Hawthorne on: 06/22/2022 03:07 PM   Modules accepted: Orders

## 2022-06-22 NOTE — Assessment & Plan Note (Signed)
Duration of 1 day.  Point-of-care urine trace leukocytes, negative nitrites and blood.  We jointly agreed to wait on urine culture prior to antibiotic treatment

## 2022-06-23 ENCOUNTER — Telehealth: Payer: Self-pay

## 2022-06-23 NOTE — Telephone Encounter (Signed)
Patient called back and stated, "I got it right". She does not need a call back.

## 2022-06-23 NOTE — Telephone Encounter (Signed)
NOTED!! Thanks KIM.Marland Kitchen

## 2022-06-23 NOTE — Telephone Encounter (Signed)
Called and spoke to patient and informed her that Joycelyn Schmid  had just sent in the rx and she was able to go pick it up.

## 2022-06-23 NOTE — Telephone Encounter (Signed)
Patient has question about how to take her predniSONE (DELTASONE) 10 MG tablet.  Please call.

## 2022-06-24 LAB — URINE CULTURE
MICRO NUMBER:: 14007087
SPECIMEN QUALITY:: ADEQUATE

## 2022-07-05 MED ORDER — IPRATROPIUM-ALBUTEROL 0.5-2.5 (3) MG/3ML IN SOLN
3.0000 mL | Freq: Once | RESPIRATORY_TRACT | Status: AC
  Administered 2022-06-21: 3 mL via RESPIRATORY_TRACT

## 2022-07-05 NOTE — Addendum Note (Signed)
Addended by: [place], Yanilen Adamik on: 07/05/2022 08:34 AM   Modules accepted: Orders

## 2022-07-18 ENCOUNTER — Ambulatory Visit (INDEPENDENT_AMBULATORY_CARE_PROVIDER_SITE_OTHER): Payer: Medicare Other | Admitting: Family

## 2022-07-18 ENCOUNTER — Encounter: Payer: Self-pay | Admitting: Family

## 2022-07-18 ENCOUNTER — Telehealth: Payer: Self-pay | Admitting: Family

## 2022-07-18 ENCOUNTER — Telehealth: Payer: Self-pay

## 2022-07-18 ENCOUNTER — Other Ambulatory Visit (HOSPITAL_COMMUNITY)
Admission: RE | Admit: 2022-07-18 | Discharge: 2022-07-18 | Disposition: A | Discharge status: Home / Self Care | Payer: Medicare Other | Source: Ambulatory Visit | Attending: Family | Admitting: Family

## 2022-07-18 VITALS — BP 110/68 | HR 68 | Temp 98.1°F | Ht 68.0 in | Wt 231.6 lb

## 2022-07-18 DIAGNOSIS — J441 Chronic obstructive pulmonary disease with (acute) exacerbation: Secondary | ICD-10-CM

## 2022-07-18 DIAGNOSIS — R350 Frequency of micturition: Secondary | ICD-10-CM | POA: Diagnosis not present

## 2022-07-18 DIAGNOSIS — Z23 Encounter for immunization: Secondary | ICD-10-CM

## 2022-07-18 DIAGNOSIS — N898 Other specified noninflammatory disorders of vagina: Secondary | ICD-10-CM

## 2022-07-18 LAB — POCT URINALYSIS DIP (CLINITEK)
Bilirubin, UA: NEGATIVE
Blood, UA: NEGATIVE
Glucose, UA: NEGATIVE mg/dL
Ketones, POC UA: NEGATIVE mg/dL
Nitrite, UA: NEGATIVE
POC PROTEIN,UA: NEGATIVE
Spec Grav, UA: 1.02 (ref 1.010–1.025)
Urobilinogen, UA: 0.2 E.U./dL
pH, UA: 7 (ref 5.0–8.0)

## 2022-07-18 LAB — URINALYSIS, ROUTINE W REFLEX MICROSCOPIC
Bilirubin Urine: NEGATIVE
Hgb urine dipstick: NEGATIVE
Ketones, ur: NEGATIVE
Nitrite: NEGATIVE
RBC / HPF: NONE SEEN (ref 0–?)
Specific Gravity, Urine: 1.015 (ref 1.000–1.030)
Total Protein, Urine: NEGATIVE
Urine Glucose: NEGATIVE
Urobilinogen, UA: 0.2 (ref 0.0–1.0)
pH: 7.5 (ref 5.0–8.0)

## 2022-07-18 MED ORDER — FLUCONAZOLE 150 MG PO TABS: 150.0000 mg | ORAL_TABLET | Freq: Once | ORAL | 1 refills | Status: AC

## 2022-07-18 NOTE — Telephone Encounter (Signed)
error 

## 2022-07-18 NOTE — Progress Notes (Signed)
Subjective:    Patient ID: Cynthia [name], female    DOB: 1945-10-22, 76 y.o.   MRN: [id number]  CC: MIRI JOSE is a 76 y.o. female who presents today for an acute visit.    HPI:  Complains of intense vaginal itching, dysuria for a couple of weeks.  She has been using desitin. She has urinary burning. No thick vaginal discharge  No fever, chills, frequency, hematuria.      Previous UTI symptoms 06/22/2022.  Urine culture negative at that time. Urinary resolved for that time.   She has daily wheezing and using albuterol BID with relief. Cough has improved. No cp, palpations, sob, leg swelling.   She never picked up the symbicort due cost.   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 denies   Diabetes mellitus without complication ([place])    type 2   Family history of breast cancer    Family history of lymphoma    Family history of prostate cancer    GERD (gastroesophageal reflux disease)    Hyperlipidemia    Hypertension    Hypothyroidism    Lung nodule    Thyroid disease    Past Surgical History:  Procedure Laterality Date   BREAST BIOPSY Left 11/25/2015   stereo  neg   BREAST EXCISIONAL BIOPSY Left yrs ago   benign   COLONOSCOPY WITH PROPOFOL N/A 04/20/2017   Procedure: COLONOSCOPY WITH PROPOFOL;  Surgeon: [name], MD;  Location: [facility] ENDOSCOPY;  Service: Endoscopy;  Laterality: N/A;   COLONOSCOPY WITH PROPOFOL N/A 01/08/2018   Procedure: COLONOSCOPY WITH PROPOFOL;  Surgeon: [name], MD;  Location: [facility] ENDOSCOPY;  Service: Gastroenterology;  Laterality: N/A;   COLONOSCOPY WITH PROPOFOL N/A 08/10/2018   Procedure: COLONOSCOPY WITH PROPOFOL;  Surgeon: [name], MD;  Location: [facility] ENDOSCOPY;  Service: Gastroenterology;  Laterality: N/A;   COLONOSCOPY WITH PROPOFOL N/A 02/08/2021    Procedure: COLONOSCOPY WITH PROPOFOL;  Surgeon: [name], MD;  Location: [facility] ENDOSCOPY;  Service: Gastroenterology;  Laterality: N/A;   IR RADIOLOGIST EVAL & MGMT  04/08/2020   IR RADIOLOGIST EVAL & MGMT  05/26/2020   IR RADIOLOGIST EVAL & MGMT  09/29/2020   IR RADIOLOGIST EVAL & MGMT  03/10/2021   IR RADIOLOGIST EVAL & MGMT  12/10/2021   RADIOLOGY WITH ANESTHESIA N/A 04/29/2020   Procedure: CRYOABLATION;  Surgeon: Arne Cleveland, MD;  Location: [facility] ORS;  Service: Radiology;  Laterality: N/A;   THYROIDECTOMY     Dr. [name]   VAGINAL DELIVERY     4   Family History  Problem Relation Age of Onset   Hypertension Mother    Hypertension Father    Prostate cancer Father 10   Diabetes Sister    Thyroid disease Sister    Breast cancer Sister 40   Breast cancer Sister 72   Lymphoma Sister 18   Breast cancer Maternal Aunt    Breast cancer Maternal Aunt    Cancer Paternal Grandmother        unk type    Allergies: Patient has no known allergies. Current Outpatient Medications on File Prior to Visit  Medication Sig Dispense Refill   albuterol (VENTOLIN HFA) 108 (90 Base) MCG/ACT inhaler Inhale 2 puffs into the lungs every 6 (six) hours as needed for wheezing  or shortness of breath. 8 g 1   amLODipine (NORVASC) 10 MG tablet TAKE 1 TABLET BY MOUTH EVERY DAY 90 tablet 3   aspirin EC 81 MG tablet Take 81 mg by mouth daily. Swallow whole.     atorvastatin (LIPITOR) 20 MG tablet TAKE 1 TABLET BY MOUTH EVERY DAY 90 tablet 0   blood glucose meter kit and supplies KIT Dispense based on patient and insurance preference. Use up to four times daily as directed. 1 each 0   Calcium Carbonate-Vit D-Min (CALCIUM 600+D PLUS MINERALS) 600-400 MG-UNIT TABS Take 1 tablet by mouth daily.      DULoxetine (CYMBALTA) 60 MG capsule TAKE 1 CAPSULE BY MOUTH EVERY DAY 90 capsule 3   Lancet Devices (ONE TOUCH DELICA LANCING DEV) MISC 1 Device by Does not apply route daily. 1 each 2   Lancets (ONETOUCH DELICA PLUS  [id number]) MISC USE AS INSTRUCTED 100 each 12   levothyroxine (SYNTHROID) 137 MCG tablet TAKE 1 TABLET BY MOUTH EVERY DAY BEFORE BREAKFAST 90 tablet 1   losartan-hydrochlorothiazide (HYZAAR) 50-12.5 MG tablet Take 1 tablet by mouth daily. 90 tablet 3   metFORMIN (GLUCOPHAGE) 500 MG tablet TAKE 1 TABLET BY MOUTH TWICE A DAY 180 tablet 1   metoprolol tartrate (LOPRESSOR) 25 MG tablet Take 1 tablet (25 mg total) by mouth 2 (two) times daily. 90 tablet 1   ONETOUCH ULTRA test strip TEST BLOOD SUGAR 1-2 TIMES A DAY 100 strip 5   budesonide-formoterol (SYMBICORT) 80-4.5 MCG/ACT inhaler Inhale 2 puffs into the lungs 2 (two) times daily. (Patient not taking: Reported on 07/18/2022) 1 each 12   diclofenac sodium (VOLTAREN) 1 % GEL Apply 1 application topically 4 (four) times daily as needed (knee pain.).  (Patient not taking: Reported on 07/18/2022)     guaiFENesin (MUCINEX) 600 MG 12 hr tablet Take 2 tablets (1,200 mg total) by mouth 2 (two) times daily. (Patient not taking: Reported on 07/18/2022) 28 tablet 1   predniSONE (DELTASONE) 10 MG tablet Take 4 tablets ( total 40 mg) by mouth for 2 days; take 3 tablets ( total 30 mg) by mouth for 2 days; take 2 tablets ( total 20 mg) by mouth for 1 day; take 1 tablet ( total 10 mg) by mouth for 1 day. (Patient not taking: Reported on 07/18/2022) 17 tablet 0   No current facility-administered medications on file prior to visit.    Social History   Tobacco Use   Smoking status: Former    Packs/day: 0.75    Years: 48.00    Total pack years: 36.00    Types: Cigarettes    Quit date: 2015    Years since quitting: 8.8   Smokeless tobacco: Former    Types: Snuff, Chew  Vaping Use   Vaping Use: Never used  Substance Use Topics   Alcohol use: No   Drug use: No    Review of Systems  Constitutional:  Negative for chills and fever.  HENT:  Negative for congestion.   Respiratory:  Positive for wheezing. Negative for cough and shortness of breath.    Cardiovascular:  Negative for chest pain and palpitations.  Gastrointestinal:  Negative for nausea and vomiting.  Genitourinary:  Positive for dysuria and vaginal pain (itching). Negative for menstrual problem, vaginal bleeding and vaginal discharge.      Objective:    BP 110/68 (BP Location: Right Arm, Patient Position: Sitting, Cuff Size: Normal)   Pulse 68   Temp 98.1 F (36.7 C) (Oral)  Ht _0  (1.727 m)   Wt 231 lb 9.6 oz (105.1 kg)   SpO2 98%   BMI 35.21 kg/m    Physical Exam Vitals reviewed.  Constitutional:      Appearance: She is well-developed.  HENT:     Head: Normocephalic and atraumatic.     Right Ear: Hearing, tympanic membrane, ear canal and external ear normal. No decreased hearing noted. No drainage, swelling or tenderness. No middle ear effusion. No foreign body. Tympanic membrane is not erythematous or bulging.     Left Ear: Hearing, tympanic membrane, ear canal and external ear normal. No decreased hearing noted. No drainage, swelling or tenderness.  No middle ear effusion. No foreign body. Tympanic membrane is not erythematous or bulging.     Nose: Nose normal. No rhinorrhea.     Right Sinus: No maxillary sinus tenderness or frontal sinus tenderness.     Left Sinus: No maxillary sinus tenderness or frontal sinus tenderness.     Mouth/Throat:     Pharynx: Uvula midline. No oropharyngeal exudate or posterior oropharyngeal erythema.     Tonsils: No tonsillar abscesses.  Eyes:     Conjunctiva/sclera: Conjunctivae normal.  Cardiovascular:     Rate and Rhythm: Normal rate and regular rhythm.     Pulses: Normal pulses.     Heart sounds: Normal heart sounds.  Pulmonary:     Effort: Pulmonary effort is normal.     Breath sounds: Normal breath sounds. No wheezing, rhonchi or rales.  Genitourinary:    Labia:        Right: No rash or tenderness.        Left: No rash or tenderness.        Comments: Hypopigmentation noted. No vaginal discharge.  No  rash Lymphadenopathy:     Head:     Right side of head: No submental, submandibular, tonsillar, preauricular, posterior auricular or occipital adenopathy.     Left side of head: No submental, submandibular, tonsillar, preauricular, posterior auricular or occipital adenopathy.     Cervical: No cervical adenopathy.  Skin:    General: Skin is warm and dry.  Neurological:     Mental Status: She is alert.  Psychiatric:        Speech: Speech normal.        Behavior: Behavior normal.        Thought Content: Thought content normal.        Assessment & Plan:   Problem List Items Addressed This Visit       Respiratory   COPD exacerbation (HCC)    Improved overall.  No acute respiratory distress.  Continues to wheeze.  Coughing has improved.She never picked up the symbicort due cost. Given Breztri sample in office today to see if helpful. Close follow up.         Genitourinary   Vaginal itching - Primary    Point-of-care urinalysis negative blood nitrites.  Small leukocytes.  Previous urine culture has been negative on 06/22/2022.  Vaginal itching seems rather external.  On exam no thick vaginal discharge present.  She has been applying Desitin on labia and area appeared discolored.  Based on symptoms, I think is reasonable to treat for vulvovaginal candidiasis with Diflucan. Follow up exam to evaluate lichensclerosis after she has stopped desitin ( afraid if obscured skin color today).  Suspect vaginal atrophy contributing as well.   consider GYN consult.       Relevant Medications   fluconazole (DIFLUCAN) 150 MG tablet  Other Relevant Orders   Cervicovaginal ancillary only   Other Visit Diagnoses     Urine frequency       Relevant Orders   POCT URINALYSIS DIP (CLINITEK) (Completed)   Urine Culture   Urinalysis, Routine w reflex microscopic         I am having Nessa A. Bourke start on fluconazole. I am also having her maintain her Calcium 600+D Plus Minerals, diclofenac  sodium, metoprolol tartrate, aspirin EC, OneTouch Delica Plus [id number], blood glucose meter kit and supplies, ONE TOUCH DELICA LANCING DEV, OneTouch Ultra, DULoxetine, levothyroxine, atorvastatin, amLODipine, albuterol, metFORMIN, guaiFENesin, losartan-hydrochlorothiazide, budesonide-formoterol, and predniSONE.   Meds ordered this encounter  Medications   fluconazole (DIFLUCAN) 150 MG tablet    Sig: Take 1 tablet (150 mg total) by mouth once for 1 dose. Take one tablet PO once. If sxs persist, may take one tablet PO 3 days later.    Dispense:  2 tablet    Refill:  1    Order Specific Question:   Supervising Provider    Answer:   Crecencio Mc [[id number]]    Return precautions given.   Risks, benefits, and alternatives of the medications and treatment plan prescribed today were discussed, and patient expressed understanding.   Education regarding symptom management and diagnosis given to patient on AVS.  Continue to follow with Burnard Hawthorne, FNP for routine health maintenance.   Cynthia [name] and I agreed with plan.   Mable Paris, FNP

## 2022-07-18 NOTE — Assessment & Plan Note (Addendum)
Improved overall.  No acute respiratory distress.  Continues to wheeze.  Coughing has improved.She never picked up the symbicort due cost. Given Breztri sample in office today to see if helpful. Close follow up.

## 2022-07-18 NOTE — Assessment & Plan Note (Addendum)
Point-of-care urinalysis negative blood nitrites.  Small leukocytes.  Previous urine culture has been negative on 06/22/2022.  Vaginal itching seems rather external.  On exam no thick vaginal discharge present.  She has been applying Desitin on labia and area appeared discolored.  Based on symptoms, I think is reasonable to treat for vulvovaginal candidiasis with Diflucan. Follow up exam to evaluate lichensclerosis after she has stopped desitin ( afraid if obscured skin color today).  Suspect vaginal atrophy contributing as well.   consider GYN consult.

## 2022-07-18 NOTE — Patient Instructions (Addendum)
STOP desitin  I have sent Diflucan to your pharmacy which is an antifungal used to treat vaginal yeast infection.  May take 1 dose a day and 3 days later if vaginal itching is still present , may take another dose.  We will call you with results of urine studies.  For continued wheezing, I like for you to try Breztri 2 puffs inhaled twice daily which is an inhaler use for COPD for maintenance.  Please let me know if you would like to continue this medication so that I can send in a  prescription

## 2022-07-18 NOTE — Telephone Encounter (Signed)
Medication Samples have been provided to the patient.  Drug name: Breztri      Strength: 160 mcg/1mg/4.8mcg        Qty: 1 box  [id number]  Exp.Date: 3/25  Dosing instructions: inhale 2 puffs twice daily  The patient has been instructed regarding the correct time, dose, and frequency of taking this medication, including desired effects and most common side effects.   LGracy Racer12:41 PM 07/18/2022

## 2022-07-19 LAB — CERVICOVAGINAL ANCILLARY ONLY
Bacterial Vaginitis (gardnerella): POSITIVE — AB
Candida Glabrata: NEGATIVE
Candida Vaginitis: POSITIVE — AB
Chlamydia: NEGATIVE
Comment: NEGATIVE
Comment: NEGATIVE
Comment: NEGATIVE
Comment: NEGATIVE
Comment: NEGATIVE
Comment: NORMAL
Neisseria Gonorrhea: NEGATIVE
Trichomonas: NEGATIVE

## 2022-07-20 LAB — URINE CULTURE
MICRO NUMBER:: 14118186
SPECIMEN QUALITY:: ADEQUATE

## 2022-07-22 ENCOUNTER — Other Ambulatory Visit: Payer: Self-pay | Admitting: Family

## 2022-07-22 DIAGNOSIS — E1140 Type 2 diabetes mellitus with diabetic neuropathy, unspecified: Secondary | ICD-10-CM

## 2022-07-22 DIAGNOSIS — B9689 Other specified bacterial agents as the cause of diseases classified elsewhere: Secondary | ICD-10-CM

## 2022-07-22 MED ORDER — METRONIDAZOLE 0.75 % EX GEL: 1.0000 | Freq: Every day | CUTANEOUS | 1 refills | Status: AC

## 2022-07-26 ENCOUNTER — Ambulatory Visit (INDEPENDENT_AMBULATORY_CARE_PROVIDER_SITE_OTHER): Payer: Medicare Other | Admitting: Family

## 2022-07-26 ENCOUNTER — Other Ambulatory Visit (HOSPITAL_COMMUNITY)
Admission: RE | Admit: 2022-07-26 | Discharge: 2022-07-26 | Disposition: A | Discharge status: Home / Self Care | Payer: Medicare Other | Source: Ambulatory Visit | Attending: Family | Admitting: Family

## 2022-07-26 ENCOUNTER — Encounter: Payer: Self-pay | Admitting: Family

## 2022-07-26 VITALS — BP 118/80 | HR 78 | Temp 98.5°F | Ht 68.0 in | Wt 232.2 lb

## 2022-07-26 DIAGNOSIS — N898 Other specified noninflammatory disorders of vagina: Secondary | ICD-10-CM | POA: Diagnosis not present

## 2022-07-26 DIAGNOSIS — R062 Wheezing: Secondary | ICD-10-CM | POA: Diagnosis not present

## 2022-07-26 MED ORDER — FLUCONAZOLE 150 MG PO TABS: ORAL_TABLET | ORAL | 0 refills | Status: DC

## 2022-07-26 NOTE — Progress Notes (Unsigned)
Subjective:    Patient ID: Cynthia [name], female    DOB: October 23, 1945, 76 y.o.   MRN: [id number]  CC: Cynthia [name] is a 76 y.o. female who presents today for follow up.   HPI: HPI Follow-up vaginal rash, recollect urine  Vaginal itch resolved.  No dysuria, hematuria.   She has seen [facility] vaginal bleeding for 3 days, started when  started the metronidazole gel.   Stopped desitin.    isnt sure if scratched  given Cynthia [name] 07/18/22 She never took the diflucan  Vaginal specimen shows bacterial vaginitis.  Started on metronidazole gel.  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 denies   Diabetes mellitus without complication ([place])    type 2   Family history of breast cancer    Family history of lymphoma    Family history of prostate cancer    GERD (gastroesophageal reflux disease)    Hyperlipidemia    Hypertension    Hypothyroidism    Lung nodule    Thyroid disease    Past Surgical History:  Procedure Laterality Date   BREAST BIOPSY Left 11/25/2015   stereo  neg   BREAST EXCISIONAL BIOPSY Left yrs ago   benign   COLONOSCOPY WITH PROPOFOL N/A 04/20/2017   Procedure: COLONOSCOPY WITH PROPOFOL;  Surgeon: [name], MD;  Location: [facility] ENDOSCOPY;  Service: Endoscopy;  Laterality: N/A;   COLONOSCOPY WITH PROPOFOL N/A 01/08/2018   Procedure: COLONOSCOPY WITH PROPOFOL;  Surgeon: [name], MD;  Location: [facility] ENDOSCOPY;  Service: Gastroenterology;  Laterality: N/A;   COLONOSCOPY WITH PROPOFOL N/A 08/10/2018   Procedure: COLONOSCOPY WITH PROPOFOL;  Surgeon: [name], MD;  Location: [facility] ENDOSCOPY;  Service: Gastroenterology;  Laterality: N/A;   COLONOSCOPY WITH PROPOFOL N/A 02/08/2021   Procedure: COLONOSCOPY WITH PROPOFOL;  Surgeon: [name], MD;  [facility] END[facility];  Service: Gastroenterology;   Laterality: N/A;   IR RADIOLOGIST EVAL & MGMT  04/08/2020   IR RADIOLOGIST EVAL & MGMT  05/26/2020   IR RADIOLOGIST EVAL & MGMT  09/29/2020   IR RADIOLOGIST EVAL & MGMT  03/10/2021   IR RADIOLOGIST EVAL & MGMT  12/10/2021   RADIOLOGY WITH ANESTHESIA N/A 04/29/2020   Procedure: CRYOABLATION;  Surgeon: Arne Cleveland, MD;  Location: [facility] ORS;  Service: Radiology;  Laterality: N/A;   THYROIDECTOMY     Dr. [name]   VAGINAL DELIVERY     4   Family History  Problem Relation Age of Onset   Hypertension Mother    Hypertension Father    Prostate cancer Father 36   Diabetes Sister    Thyroid disease Sister    Breast cancer Sister 65   Breast cancer Sister 22   Lymphoma Sister 26   Breast cancer Maternal Aunt    Breast cancer Maternal Aunt    Cancer Paternal Grandmother        unk type    Allergies: Patient has no known allergies. Current Outpatient Medications on File Prior to Visit  Medication Sig Dispense Refill   albuterol (VENTOLIN HFA) 108 (90 Base) MCG/ACT inhaler Inhale 2 puffs into the lungs every 6 (six) hours as needed for wheezing or shortness of breath. 8 g 1   amLODipine (NORVASC) 10 MG tablet TAKE 1 TABLET BY MOUTH EVERY DAY 90 tablet 3  aspirin EC 81 MG tablet Take 81 mg by mouth daily. Swallow whole.     atorvastatin (LIPITOR) 20 MG tablet TAKE 1 TABLET BY MOUTH EVERY DAY 90 tablet 0   blood glucose meter kit and supplies KIT Dispense based on patient and insurance preference. Use up to four times daily as directed. 1 each 0   budesonide-formoterol (SYMBICORT) 80-4.5 MCG/ACT inhaler Inhale 2 puffs into the lungs 2 (two) times daily. (Patient not taking: Reported on 07/18/2022) 1 each 12   Calcium Carbonate-Vit D-Min (CALCIUM 600+D PLUS MINERALS) 600-400 MG-UNIT TABS Take 1 tablet by mouth daily.      diclofenac sodium (VOLTAREN) 1 % GEL Apply 1 application topically 4 (four) times daily as needed (knee pain.).  (Patient not taking: Reported on 07/18/2022)     DULoxetine  (CYMBALTA) 60 MG capsule TAKE 1 CAPSULE BY MOUTH EVERY DAY 90 capsule 3   guaiFENesin (MUCINEX) 600 MG 12 hr tablet Take 2 tablets (1,200 mg total) by mouth 2 (two) times daily. (Patient not taking: Reported on 07/18/2022) 28 tablet 1   Lancet Devices (ONE TOUCH DELICA LANCING DEV) MISC 1 Device by Does not apply route daily. 1 each 2   Lancets (ONETOUCH DELICA PLUS LEXNTZ00F) MISC USE AS INSTRUCTED 100 each 12   levothyroxine (SYNTHROID) 137 MCG tablet TAKE 1 TABLET BY MOUTH EVERY DAY BEFORE BREAKFAST 90 tablet 1   losartan-hydrochlorothiazide (HYZAAR) 50-12.5 MG tablet Take 1 tablet by mouth daily. 90 tablet 3   metFORMIN (GLUCOPHAGE) 500 MG tablet TAKE 1 TABLET BY MOUTH TWICE A DAY 180 tablet 1   metoprolol tartrate (LOPRESSOR) 25 MG tablet Take 1 tablet (25 mg total) by mouth 2 (two) times daily. 90 tablet 1   metroNIDAZOLE (METROGEL) 0.75 % gel Apply 1 Application topically daily for 5 days. 5 g 1   ONETOUCH ULTRA test strip TEST BLOOD SUGAR 1-2 TIMES A DAY 100 strip 5   predniSONE (DELTASONE) 10 MG tablet Take 4 tablets ( total 40 mg) by mouth for 2 days; take 3 tablets ( total 30 mg) by mouth for 2 days; take 2 tablets ( total 20 mg) by mouth for 1 day; take 1 tablet ( total 10 mg) by mouth for 1 day. (Patient not taking: Reported on 07/18/2022) 17 tablet 0   No current facility-administered medications on file prior to visit.    Social History   Tobacco Use   Smoking status: Former    Packs/day: 0.75    Years: 48.00    Total pack years: 36.00    Types: Cigarettes    Quit date: 2015    Years since quitting: 8.8   Smokeless tobacco: Former    Types: Snuff, Chew  Vaping Use   Vaping Use: Never used  Substance Use Topics   Alcohol use: No   Drug use: No    Review of Systems    Objective:    There were no vitals taken for this visit. BP Readings from Last 3 Encounters:  07/18/22 110/68  06/22/22 114/68  06/06/22 130/82   Wt Readings from Last 3 Encounters:  07/18/22  231 lb 9.6 oz (105.1 kg)  06/22/22 229 lb (103.9 kg)  06/06/22 228 lb 6.4 oz (103.6 kg)    Physical Exam     Assessment & Plan:   Problem List Items Addressed This Visit   None    I am having Laqueena A. Lacina maintain her Calcium 600+D Plus Minerals, diclofenac sodium, metoprolol tartrate, aspirin EC, OneTouch Delica Plus  Lancet30G, blood glucose meter kit and supplies, ONE TOUCH DELICA LANCING DEV, DULoxetine, levothyroxine, atorvastatin, amLODipine, albuterol, metFORMIN, guaiFENesin, losartan-hydrochlorothiazide, budesonide-formoterol, predniSONE, OneTouch Ultra, and metroNIDAZOLE.   No orders of the defined types were placed in this encounter.   Return precautions given.   Risks, benefits, and alternatives of the medications and treatment plan prescribed today were discussed, and patient expressed understanding.   Education regarding symptom management and diagnosis given to patient on AVS.  Continue to follow with Burnard Hawthorne, FNP for routine health maintenance.   Cynthia [name] and I agreed with plan.   Mable Paris, FNP

## 2022-07-26 NOTE — Patient Instructions (Signed)
Start antifungal for suspected yeast infection of the vagina.  Please use metronidazole gel without applicator and use her finger instead as I think the applicator is been abrasive causing external vaginal bleeding.  Please let me know if vaginal bleeding persists as most certainly warrants further investigation  Nice to see you    Vaginal Yeast Infection, Adult  Vaginal yeast infection is a condition that causes vaginal discharge as well as soreness, swelling, and redness (inflammation) of the vagina. This is a common condition. Some women get this infection frequently. What are the causes? This condition is caused by a change in the normal balance of the yeast (Candida) and normal bacteria that live in the vagina. This change causes an overgrowth of yeast, which causes the inflammation. What increases the risk? The condition is more likely to develop in women who: Take antibiotic medicines. Have diabetes. Take birth control pills. Are pregnant. Douche often. Have a weak body defense system (immune system). Have been taking steroid medicines for a long time. Frequently wear tight clothing. What are the signs or symptoms? Symptoms of this condition include: White, thick, creamy vaginal discharge. Swelling, itching, redness, and irritation of the vagina. The lips of the vagina (labia) may be affected as well. Pain or a burning feeling while urinating. Pain during sex. How is this diagnosed? This condition is diagnosed based on: Your medical history. A physical exam. A pelvic exam. Your health care provider will examine a sample of your vaginal discharge under a microscope. Your health care provider may send this sample for testing to confirm the diagnosis. How is this treated? This condition is treated with medicine. Medicines may be over-the-counter or prescription. You may be told to use one or more of the following: Medicine that is taken by mouth (orally). Medicine that is  applied as a cream (topically). Medicine that is inserted directly into the vagina (suppository). Follow these instructions at home: Take or apply over-the-counter and prescription medicines only as told by your health care provider. Do not use tampons until your health care provider approves. Do not have sex until your infection has cleared. Sex can prolong or worsen your symptoms of infection. Ask your health care provider when it is safe to resume sexual activity. Keep all follow-up visits. This is important. How is this prevented?  Do not wear tight clothes, such as pantyhose or tight pants. Wear breathable cotton underwear. Do not use douches, perfumed soap, creams, or powders. Wipe from front to back after using the toilet. If you have diabetes, keep your blood sugar levels under control. Ask your health care provider for other ways to prevent yeast infections. Contact a health care provider if: You have a fever. Your symptoms go away and then return. Your symptoms do not get better with treatment. Your symptoms get worse. You have new symptoms. You develop blisters in or around your vagina. You have blood coming from your vagina and it is not your menstrual period. You develop pain in your abdomen. Summary Vaginal yeast infection is a condition that causes discharge as well as soreness, swelling, and redness (inflammation) of the vagina. This condition is treated with medicine. Medicines may be over-the-counter or prescription. Take or apply over-the-counter and prescription medicines only as told by your health care provider. Do not douche. Resume sexual activity or use of tampons as instructed by your health care provider. Contact a health care provider if your symptoms do not get better with treatment or your symptoms go away and  then return. This information is not intended to replace advice given to you by your health care provider. Make sure you discuss any questions you  have with your health care provider. Document Revised: 11/23/2020 Document Reviewed: 11/23/2020 Elsevier Patient Education  [place].

## 2022-07-27 MED ORDER — BREZTRI AEROSPHERE 160-9-4.8 MCG/ACT IN AERO: 2.0000 | INHALATION_SPRAY | Freq: Two times a day (BID) | RESPIRATORY_TRACT | 11 refills | Status: DC

## 2022-07-27 NOTE — Assessment & Plan Note (Addendum)
Improved on Breztri sample therefor I have refilled today.    Patient quit smoking in 2015 ;  suspect underlying COPD as evident with recent CT chest 05/2022.  She will continue annual screening.

## 2022-07-27 NOTE — Assessment & Plan Note (Signed)
Improved.  However thick vaginal discharge in vaginal canal raises concern for Candida.  I have prescribed Diflucan.  She will complete metronidazole gel.  Discussed I suspect applicator from gel has caused very small lacerations as seen on exam today.   Advised her if blood were to persist after she has completed metronidazole to let me know right away so we can begin further evaluation of postmenopausal bleeding.  Patient verbalized understanding

## 2022-07-28 ENCOUNTER — Other Ambulatory Visit: Payer: Self-pay

## 2022-07-28 DIAGNOSIS — N898 Other specified noninflammatory disorders of vagina: Secondary | ICD-10-CM

## 2022-07-28 NOTE — Progress Notes (Signed)
Patient had urine cup to give sample but could not use the restroom! Orders in

## 2022-07-29 LAB — CERVICOVAGINAL ANCILLARY ONLY
Comment: NEGATIVE
Comment: NEGATIVE
Comment: NEGATIVE

## 2022-08-03 ENCOUNTER — Ambulatory Visit: Payer: Medicare Other | Admitting: Family

## 2022-08-05 ENCOUNTER — Encounter: Payer: Self-pay | Admitting: Family

## 2022-08-05 ENCOUNTER — Telehealth: Payer: Self-pay | Admitting: Family

## 2022-08-05 DIAGNOSIS — R059 Cough, unspecified: Secondary | ICD-10-CM

## 2022-08-05 NOTE — Telephone Encounter (Signed)
Thanks Rasheedah! 

## 2022-08-05 NOTE — Telephone Encounter (Signed)
Cynthia [name] How soon can she see pulmonology? Referral placed   I called pt and left vm to check on her  [name],  Call pt  In regards to cough, please quantify how often she is coughing and if it is productive.  Please ensure she is not having fever or shortness of breath or wheezing. Please confirm that she is using both Breztri inhaler (sample provided) and also albuterol inhaler.    Is she taking any cough medication ? I had given her tessalon in the past.   I would like to refer her to pulmonology as well as this cough has lasted for  months and due to her h/o smoking, I have concern for COPD.    Given augmentin 06/06/22 Prednisone 06/22/22

## 2022-08-08 ENCOUNTER — Ambulatory Visit (INDEPENDENT_AMBULATORY_CARE_PROVIDER_SITE_OTHER): Payer: Medicare Other | Admitting: Family

## 2022-08-08 ENCOUNTER — Encounter: Payer: Self-pay | Admitting: Family

## 2022-08-08 ENCOUNTER — Other Ambulatory Visit (HOSPITAL_COMMUNITY)
Admission: RE | Admit: 2022-08-08 | Discharge: 2022-08-08 | Disposition: A | Discharge status: Home / Self Care | Payer: Medicare Other | Source: Ambulatory Visit | Attending: Family | Admitting: Family

## 2022-08-08 VITALS — BP 120/78 | HR 86 | Temp 97.9°F | Wt 231.2 lb

## 2022-08-08 DIAGNOSIS — M1990 Unspecified osteoarthritis, unspecified site: Secondary | ICD-10-CM | POA: Diagnosis not present

## 2022-08-08 DIAGNOSIS — B372 Candidiasis of skin and nail: Secondary | ICD-10-CM | POA: Diagnosis not present

## 2022-08-08 DIAGNOSIS — R053 Chronic cough: Secondary | ICD-10-CM | POA: Diagnosis not present

## 2022-08-08 DIAGNOSIS — J029 Acute pharyngitis, unspecified: Secondary | ICD-10-CM | POA: Diagnosis not present

## 2022-08-08 DIAGNOSIS — N898 Other specified noninflammatory disorders of vagina: Secondary | ICD-10-CM

## 2022-08-08 MED ORDER — HYDROCORTISONE 2.5 % EX CREA: TOPICAL_CREAM | CUTANEOUS | 1 refills | Status: DC

## 2022-08-08 MED ORDER — CLOTRIMAZOLE 10 MG MT TROC: 10.0000 mg | Freq: Three times a day (TID) | OROMUCOSAL | 0 refills | Status: AC

## 2022-08-08 MED ORDER — CLOTRIMAZOLE 1 % EX CREA: 1.0000 | TOPICAL_CREAM | Freq: Two times a day (BID) | CUTANEOUS | 1 refills | Status: DC

## 2022-08-08 MED ORDER — MELOXICAM 7.5 MG PO TABS: 7.5000 mg | ORAL_TABLET | Freq: Every day | ORAL | 2 refills | Status: DC | PRN

## 2022-08-08 NOTE — Patient Instructions (Addendum)
Start mucinex to help break up mucous.   Use albuterol every 6 hours for first 24 hours to get good medication into the lungs and loosen congestion; after, you may use as needed and eventually stop all together when cough resolves.  Continue Breztri  Start anti fungal ointment and topical steroid vaginally.   Appointment with pulmonology Wednesday, November 29 at 9:30 AM addresses [address] [place].   [phone]

## 2022-08-08 NOTE — Assessment & Plan Note (Signed)
Intense itching c/w Candida intertrigo.  Provided her with topical steroid, antifungal. Pending wet prep.

## 2022-08-08 NOTE — Assessment & Plan Note (Addendum)
No acute respiratory distress.  Wheezing has resolved. No increased sputum.  Given augmentin 06/06/22 Prednisone 06/22/22 CXR 06/06/22 no acute findings.  Advised to start mucinex and we called to arrange pulmonology consult in 9 days. Continue Breztri. Encouraged use of albuterol. Will follow.

## 2022-08-08 NOTE — Progress Notes (Signed)
Subjective:    Patient ID: Cynthia [name], female    DOB: November 20, 1945, 76 y.o.   MRN: [id number]  CC: Cynthia [name] is a 76 y.o. female who presents today for an acute visit.    HPI: Complains of continue cough which starts in late afternoon x 2 months. Cough is not worse with activity.  Sputum is not increased but she can hear congestion in her chest.  Mostly dry cough. Occasional wheeze has improved Bretzri.  She is worried about thrush.   She is rinsing her mouth out after use of inhaler.  She wears dentures     Felt better after prednisone.   Started Breztri inhaler (sample provided) which has helped.   She is not using albuterol inhaler.     She is not taking tessalon for cough.   No fever, sob, epigastric cough, excessive burping or belching.     Referral in place to pulmonology   h/o smoking    Given augmentin 06/06/22 Prednisone 06/22/22  CXR 06/06/22 no acute findings.    Complains of external vaginal itching returned yesterday   Given Diflucan 07/26/2022, MetroGel 07/22/2022  Would like refill of mobic for chronic right knee and left wrist pain. 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 denies   Diabetes mellitus without complication ([place])    type 2   Family history of breast cancer    Family history of lymphoma    Family history of prostate cancer    GERD (gastroesophageal reflux disease)    Hyperlipidemia    Hypertension    Hypothyroidism    Lung nodule    Thyroid disease    Past Surgical History:  Procedure Laterality Date   BREAST BIOPSY Left 11/25/2015   stereo  neg   BREAST EXCISIONAL BIOPSY Left yrs ago   benign   COLONOSCOPY WITH PROPOFOL N/A 04/20/2017   Procedure: COLONOSCOPY WITH PROPOFOL;  Surgeon: [name], MD;  Location: [facility] ENDOSCOPY;  Service:  Endoscopy;  Laterality: N/A;   COLONOSCOPY WITH PROPOFOL N/A 01/08/2018   Procedure: COLONOSCOPY WITH PROPOFOL;  Surgeon: [name], MD;  Location: [facility] ENDOSCOPY;  Service: Gastroenterology;  Laterality: N/A;   COLONOSCOPY WITH PROPOFOL N/A 08/10/2018   Procedure: COLONOSCOPY WITH PROPOFOL;  Surgeon: [name], MD;  Location: [facility] ENDOSCOPY;  Service: Gastroenterology;  Laterality: N/A;   COLONOSCOPY WITH PROPOFOL N/A 02/08/2021   Procedure: COLONOSCOPY WITH PROPOFOL;  Surgeon: [name], MD;  Location: [facility] ENDOSCOPY;  Service: Gastroenterology;  Laterality: N/A;   IR RADIOLOGIST EVAL & MGMT  04/08/2020   IR RADIOLOGIST EVAL & MGMT  05/26/2020   IR RADIOLOGIST EVAL & MGMT  09/29/2020   IR RADIOLOGIST EVAL & MGMT  03/10/2021   IR RADIOLOGIST EVAL & MGMT  12/10/2021   RADIOLOGY WITH ANESTHESIA N/A 04/29/2020   Procedure: CRYOABLATION;  Surgeon: Arne Cleveland, MD;  Location: [facility] ORS;  Service: Radiology;  Laterality: N/A;   THYROIDECTOMY     Dr. [name]   VAGINAL DELIVERY     4   Family History  Problem Relation Age of Onset   Hypertension Mother    Hypertension Father    Prostate cancer Father 76   Diabetes Sister    Thyroid disease Sister    Breast cancer Sister  47   Breast cancer Sister 62   Lymphoma Sister 81   Breast cancer Maternal Aunt    Breast cancer Maternal Aunt    Cancer Paternal Grandmother        unk type    Allergies: Patient has no known allergies. Current Outpatient Medications on File Prior to Visit  Medication Sig Dispense Refill   albuterol (VENTOLIN HFA) 108 (90 Base) MCG/ACT inhaler Inhale 2 puffs into the lungs every 6 (six) hours as needed for wheezing or shortness of breath. 8 g 1   amLODipine (NORVASC) 10 MG tablet TAKE 1 TABLET BY MOUTH EVERY DAY 90 tablet 3   aspirin EC 81 MG tablet Take 81 mg by mouth daily. Swallow whole.     atorvastatin (LIPITOR) 20 MG tablet TAKE 1 TABLET BY MOUTH EVERY DAY 90 tablet 0   blood glucose meter kit and supplies KIT  Dispense based on patient and insurance preference. Use up to four times daily as directed. 1 each 0   Budeson-Glycopyrrol-Formoterol (BREZTRI AEROSPHERE) 160-9-4.8 MCG/ACT AERO Inhale 2 puffs into the lungs 2 (two) times daily. 10.7 g 11   Calcium Carbonate-Vit D-Min (CALCIUM 600+D PLUS MINERALS) 600-400 MG-UNIT TABS Take 1 tablet by mouth daily.      DULoxetine (CYMBALTA) 60 MG capsule TAKE 1 CAPSULE BY MOUTH EVERY DAY 90 capsule 3   Lancet Devices (ONE TOUCH DELICA LANCING DEV) MISC 1 Device by Does not apply route daily. 1 each 2   Lancets (ONETOUCH DELICA PLUS [id number]) MISC USE AS INSTRUCTED 100 each 12   levothyroxine (SYNTHROID) 137 MCG tablet TAKE 1 TABLET BY MOUTH EVERY DAY BEFORE BREAKFAST 90 tablet 1   losartan-hydrochlorothiazide (HYZAAR) 50-12.5 MG tablet Take 1 tablet by mouth daily. 90 tablet 3   metFORMIN (GLUCOPHAGE) 500 MG tablet TAKE 1 TABLET BY MOUTH TWICE A DAY 180 tablet 1   metoprolol tartrate (LOPRESSOR) 25 MG tablet Take 1 tablet (25 mg total) by mouth 2 (two) times daily. 90 tablet 1   ONETOUCH ULTRA test strip TEST BLOOD SUGAR 1-2 TIMES A DAY 100 strip 5   diclofenac sodium (VOLTAREN) 1 % GEL Apply 1 application topically 4 (four) times daily as needed (knee pain.).  (Patient not taking: Reported on 07/18/2022)     No current facility-administered medications on file prior to visit.    Social History   Tobacco Use   Smoking status: Former    Packs/day: 0.75    Years: 48.00    Total pack years: 36.00    Types: Cigarettes    Quit date: 2015    Years since quitting: 8.8   Smokeless tobacco: Former    Types: Snuff, Chew  Vaping Use   Vaping Use: Never used  Substance Use Topics   Alcohol use: No   Drug use: No    Review of Systems  Constitutional:  Negative for chills and fever.  HENT:  Positive for congestion.   Respiratory:  Positive for cough. Negative for shortness of breath and wheezing.   Cardiovascular:  Negative for chest pain and palpitations.   Gastrointestinal:  Negative for nausea and vomiting.  Genitourinary:  Negative for difficulty urinating, vaginal bleeding, vaginal discharge and vaginal pain.      Objective:    BP 120/78 (BP Location: Left Arm, Patient Position: Sitting, Cuff Size: Normal)   Pulse 86   Temp 97.9 F (36.6 C) (Oral)   Wt 231 lb 3.2 oz (104.9 kg)   SpO2 97%   BMI 35.15 kg/m  Physical Exam Vitals reviewed.  Constitutional:      Appearance: She is well-developed.  HENT:     Mouth/Throat:     Pharynx: Posterior oropharyngeal erythema present. No oropharyngeal exudate.  Eyes:     Conjunctiva/sclera: Conjunctivae normal.  Cardiovascular:     Rate and Rhythm: Normal rate and regular rhythm.     Pulses: Normal pulses.     Heart sounds: Normal heart sounds.  Pulmonary:     Effort: Pulmonary effort is normal.     Breath sounds: Normal breath sounds. No wheezing, rhonchi or rales.  Genitourinary:    Labia:        Right: No rash, tenderness or lesion.        Left: No rash, tenderness or lesion.      Vagina: No foreign body. No vaginal discharge, erythema, tenderness or bleeding.     Cervix: No cervical motion tenderness or discharge.     Adnexa:        Right: No mass, tenderness or fullness.         Left: No mass, tenderness or fullness.       Comments: Vulvovaginal erythema present externally and entrance of vagina. No external skin lesions. Discharge is thin and clear, not purulent , white.  Skin:    General: Skin is warm and dry.  Neurological:     Mental Status: She is alert.  Psychiatric:        Speech: Speech normal.        Behavior: Behavior normal.        Thought Content: Thought content normal.        Assessment & Plan:   Problem List Items Addressed This Visit       Respiratory   Pharyngitis - Primary    Negative strep. Reiterated the importance of rinsing mouth after Breztri. Provided clotrimazole troche.       Relevant Medications   clotrimazole (MYCELEX) 10 MG  troche     Musculoskeletal and Integument   Arthritis   Relevant Medications   meloxicam (MOBIC) 7.5 MG tablet   Candidal intertrigo    Intense itching c/w Candida intertrigo.  Provided her with topical steroid, antifungal. Pending wet prep.       Relevant Medications   clotrimazole (MYCELEX) 10 MG troche   clotrimazole (LOTRIMIN) 1 % cream     Genitourinary   Vaginal itching   Relevant Medications   hydrocortisone 2.5 % cream   clotrimazole (LOTRIMIN) 1 % cream   Other Relevant Orders   Cervicovaginal ancillary only( [place])     Other   Chronic cough    No acute respiratory distress.  Wheezing has resolved. No increased sputum.  Given augmentin 06/06/22 Prednisone 06/22/22 CXR 06/06/22 no acute findings.  Advised to start mucinex and we called to arrange pulmonology consult in 9 days. Continue Breztri. Encouraged use of albuterol. Will follow.          I have discontinued Enid Derry A. Shave's fluconazole. I am also having her start on meloxicam, clotrimazole, hydrocortisone, and clotrimazole. Additionally, I am having her maintain her Calcium 600+D Plus Minerals, diclofenac sodium, metoprolol tartrate, aspirin EC, OneTouch Delica Plus [id number], blood glucose meter kit and supplies, ONE TOUCH DELICA LANCING DEV, DULoxetine, levothyroxine, atorvastatin, amLODipine, albuterol, metFORMIN, losartan-hydrochlorothiazide, OneTouch Ultra, and Breztri Aerosphere.   Meds ordered this encounter  Medications   meloxicam (MOBIC) 7.5 MG tablet    Sig: Take 1 tablet (7.5 mg total) by mouth daily as needed for pain.  Dispense:  30 tablet    Refill:  2    Order Specific Question:   Supervising Provider    Answer:   Crecencio Mc [[id number]]   clotrimazole (MYCELEX) 10 MG troche    Sig: Take 1 tablet (10 mg total) by mouth 3 (three) times daily for 5 days. Dissolve TID    Dispense:  15 tablet    Refill:  0    Order Specific Question:   Supervising Provider    Answer:   Crecencio Mc [[id number]]   hydrocortisone 2.5 % cream    Sig: Apply dime size amount external vaginal twice daily for one week, once daily for one week, and every other day for one week, then stop.    Dispense:  30 g    Refill:  1    Order Specific Question:   Supervising Provider    Answer:   Deborra Medina L [[id number]]   clotrimazole (LOTRIMIN) 1 % cream    Sig: Apply 1 Application topically 2 (two) times daily. For 2 weeks    Dispense:  30 g    Refill:  1    Order Specific Question:   Supervising Provider    Answer:   Crecencio Mc [[id number]]    Return precautions given.   Risks, benefits, and alternatives of the medications and treatment plan prescribed today were discussed, and patient expressed understanding.   Education regarding symptom management and diagnosis given to patient on AVS.  Continue to follow with Burnard Hawthorne, FNP for routine health maintenance.   Cynthia [name] and I agreed with plan.   Mable Paris, FNP

## 2022-08-08 NOTE — Assessment & Plan Note (Signed)
Negative strep. Reiterated the importance of rinsing mouth after Breztri. Provided clotrimazole troche.

## 2022-08-09 ENCOUNTER — Telehealth: Payer: Self-pay

## 2022-08-09 LAB — CERVICOVAGINAL ANCILLARY ONLY
Bacterial Vaginitis (gardnerella): POSITIVE — AB
Candida Glabrata: NEGATIVE
Candida Vaginitis: NEGATIVE
Comment: NEGATIVE
Comment: NEGATIVE
Comment: NEGATIVE

## 2022-08-09 NOTE — Telephone Encounter (Signed)
Call pt and see how she doing, ensure stable.  She had ct chest 05/2022.  She would not need another for one year.   Is she referring to chest xray?   Does she feel she needs another chest xray?  We did one 06/06/22 which was negative for pneumonia.   If she increased concern, cough, we can repeat chest xray and I will order

## 2022-08-09 NOTE — Telephone Encounter (Signed)
Patient states she would like to know if she should have a CT scan or just see the doctor on 08/17/2022.

## 2022-08-09 NOTE — Telephone Encounter (Signed)
Spoke to pt and it was an error on her part.She misunderstood about the phone call. It was actually a message for her Daughter about getting a CT instead of her.

## 2022-08-10 ENCOUNTER — Other Ambulatory Visit: Payer: Self-pay | Admitting: Family

## 2022-08-10 DIAGNOSIS — B9689 Other specified bacterial agents as the cause of diseases classified elsewhere: Secondary | ICD-10-CM

## 2022-08-10 MED ORDER — METRONIDAZOLE 500 MG PO TABS: 500.0000 mg | ORAL_TABLET | Freq: Two times a day (BID) | ORAL | 0 refills | Status: DC

## 2022-08-10 NOTE — Telephone Encounter (Signed)
noted 

## 2022-08-14 ENCOUNTER — Other Ambulatory Visit: Payer: Self-pay | Admitting: Family

## 2022-08-14 DIAGNOSIS — E785 Hyperlipidemia, unspecified: Secondary | ICD-10-CM

## 2022-08-17 ENCOUNTER — Ambulatory Visit: Payer: Medicare Other | Admitting: Student in an Organized Health Care Education/Training Program

## 2022-08-17 ENCOUNTER — Encounter: Payer: Self-pay | Admitting: Student in an Organized Health Care Education/Training Program

## 2022-08-17 VITALS — BP 120/80 | HR 98 | Temp 97.7°F | Ht 68.0 in | Wt 234.4 lb

## 2022-08-17 DIAGNOSIS — Z87891 Personal history of nicotine dependence: Secondary | ICD-10-CM

## 2022-08-17 DIAGNOSIS — R052 Subacute cough: Secondary | ICD-10-CM | POA: Diagnosis not present

## 2022-08-17 DIAGNOSIS — J432 Centrilobular emphysema: Secondary | ICD-10-CM

## 2022-08-17 MED ORDER — UMECLIDINIUM-VILANTEROL 62.5-25 MCG/ACT IN AEPB: 1.0000 | INHALATION_SPRAY | Freq: Every day | RESPIRATORY_TRACT | 11 refills | Status: DC

## 2022-08-17 NOTE — Progress Notes (Signed)
Synopsis: Referred in for cough by Burnard Hawthorne, FNP  Assessment & Plan:   #Centrilobular emphysema #Subacute Cough #Former Smoker  The patient is presenting for the evaluation of a subacute cough following recent URTI but has started to improve.  Given that the cough is already improving and is nonproductive, I will hold off on any further investigations and revisit this in 3 months.  Should there be no improvement, I will initiate a chronic cough workup.  As for her shortness of breath and history of smoking, I did review her previous CT scans that are notable for emphysema concerning for COPD.  I will obtain a pulmonary function test to evaluate spirometry, lung volumes, and DLCO.  I have also discussed with her our inhaler options and we landed on trialing LAMA/LABA combination with an oral Ellipta.  She will let our office know if the co-pay on this inhaler is too high.  - umeclidinium-vilanterol (ANORO ELLIPTA) 62.5-25 MCG/ACT AEPB; Inhale 1 puff into the lungs daily.  Dispense: 30 each; Refill: 11 - Pulmonary Function Test [facility] Only; Future   Return in about 3 months (around 11/17/2022).  I spent 60 minutes caring for this patient today, including preparing to see the patient, obtaining a medical history , reviewing a separately obtained history, performing a medically appropriate examination and/or evaluation, counseling and educating the patient/family/caregiver, ordering medications, tests, or procedures, documenting clinical information in the electronic health record, and independently interpreting results (not separately reported/billed) and communicating results to the patient/family/caregiver  Armando Reichert, MD [facility] 08/17/2022 9:47 AM    End of visit medications:  Meds ordered this encounter  Medications   umeclidinium-vilanterol (ANORO ELLIPTA) 62.5-25 MCG/ACT AEPB    Sig: Inhale 1 puff into the lungs daily.    Dispense:  30 each     Refill:  11     Current Outpatient Medications:    albuterol (VENTOLIN HFA) 108 (90 Base) MCG/ACT inhaler, Inhale 2 puffs into the lungs every 6 (six) hours as needed for wheezing or shortness of breath., Disp: 8 g, Rfl: 1   amLODipine (NORVASC) 10 MG tablet, TAKE 1 TABLET BY MOUTH EVERY DAY, Disp: 90 tablet, Rfl: 3   aspirin EC 81 MG tablet, Take 81 mg by mouth daily. Swallow whole., Disp: , Rfl:    atorvastatin (LIPITOR) 20 MG tablet, TAKE 1 TABLET BY MOUTH EVERY DAY, Disp: 90 tablet, Rfl: 0   blood glucose meter kit and supplies KIT, Dispense based on patient and insurance preference. Use up to four times daily as directed., Disp: 1 each, Rfl: 0   Budeson-Glycopyrrol-Formoterol (BREZTRI AEROSPHERE) 160-9-4.8 MCG/ACT AERO, Inhale 2 puffs into the lungs 2 (two) times daily., Disp: 10.7 g, Rfl: 11   Calcium Carbonate-Vit D-Min (CALCIUM 600+D PLUS MINERALS) 600-400 MG-UNIT TABS, Take 1 tablet by mouth daily. , Disp: , Rfl:    clotrimazole (LOTRIMIN) 1 % cream, Apply 1 Application topically 2 (two) times daily. For 2 weeks, Disp: 30 g, Rfl: 1   diclofenac sodium (VOLTAREN) 1 % GEL, Apply 1 application  topically 4 (four) times daily as needed (knee pain.)., Disp: , Rfl:    DULoxetine (CYMBALTA) 60 MG capsule, TAKE 1 CAPSULE BY MOUTH EVERY DAY, Disp: 90 capsule, Rfl: 3   hydrocortisone 2.5 % cream, Apply dime size amount external vaginal twice daily for one week, once daily for one week, and every other day for one week, then stop., Disp: 30 g, Rfl: 1   Lancet Devices (ONE [facility]  DELICA LANCING DEV) MISC, 1 Device by Does not apply route daily., Disp: 1 each, Rfl: 2   Lancets (ONETOUCH DELICA PLUS [id number]) MISC, USE AS INSTRUCTED, Disp: 100 each, Rfl: 12   levothyroxine (SYNTHROID) 137 MCG tablet, TAKE 1 TABLET BY MOUTH EVERY DAY BEFORE BREAKFAST, Disp: 90 tablet, Rfl: 1   losartan-hydrochlorothiazide (HYZAAR) 50-12.5 MG tablet, Take 1 tablet by mouth daily., Disp: 90 tablet, Rfl: 3   meloxicam  (MOBIC) 7.5 MG tablet, Take 1 tablet (7.5 mg total) by mouth daily as needed for pain., Disp: 30 tablet, Rfl: 2   metFORMIN (GLUCOPHAGE) 500 MG tablet, TAKE 1 TABLET BY MOUTH TWICE A DAY, Disp: 180 tablet, Rfl: 1   metoprolol tartrate (LOPRESSOR) 25 MG tablet, Take 1 tablet (25 mg total) by mouth 2 (two) times daily., Disp: 90 tablet, Rfl: 1   metroNIDAZOLE (FLAGYL) 500 MG tablet, Take 1 tablet (500 mg total) by mouth 2 (two) times daily., Disp: 14 tablet, Rfl: 0   ONETOUCH ULTRA test strip, TEST BLOOD SUGAR 1-2 TIMES A DAY, Disp: 100 strip, Rfl: 5   umeclidinium-vilanterol (ANORO ELLIPTA) 62.5-25 MCG/ACT AEPB, Inhale 1 puff into the lungs daily., Disp: 30 each, Rfl: 11   Subjective:   PATIENT ID: Cynthia [name] GENDER: female DOB: July 20, 1946, MRN: [id number]  Chief Complaint  Patient presents with   pulmonary consult    Cold >37moago. Dry cough, wheezing and SOB with exertion has lingered since.     HPI  Cynthia [name] a pleasant 76year old female presenting to clinic for the evaluation of cough.  She reports that she had an upper respiratory tract infection recently at which point she started developing wheezing and cough.  Review of her record is notable for a visit to her primary care physician in September 2023 for a COPD exacerbation where she was given a course of antibiotics and started on Breztri.  She reports that she has developed a cough following her URTI [place]  She tells me that the cough is dry and nonproductive and has been decreasing in intensity since it started.  She still has a cough but it is not as bad as it was prior.  She denies any hemoptysis, fevers, chills, night sweats, and weight loss.  She does report shortness of breath that is mostly exertional and is chronic.  She has not noticed that the shortness of breath has gotten any worse.  She is maintained on Breztri twice daily given to her by her primary care physician in addition to as  needed albuterol.  She tells me that the BJudithann Saugerwould be too expensive for her to buy she was given samples.  She feels a small improvement with it but is concerned about the inhaled steroid component.  She is a former smoker, having smoked for around 30 years but tells me less than a pack per day.  [place] work [place] [place] and now takes care of her disabled daughter.  Ancillary information including prior medications, full medical/surgical/family/social histories, and PFTs (when available) are listed below and have been reviewed.   Review of Systems  Constitutional:  Negative for chills, fever and weight loss.  Respiratory:  Positive for cough and shortness of breath. Negative for sputum production and wheezing.   Cardiovascular:  Negative for chest pain, leg swelling and PND.     Objective:   Vitals:   08/17/22 0921  BP: 120/80  Pulse: 98  Temp:  97.7 F (36.5 C)  TempSrc: Temporal  SpO2: 99%  Weight: 234 lb 6.4 oz (106.3 kg)  Height: _0  (1.727 m)   99% on RA  BMI Readings from Last 3 Encounters:  08/17/22 35.64 kg/m  08/08/22 35.15 kg/m  07/26/22 35.31 kg/m   Wt Readings from Last 3 Encounters:  08/17/22 234 lb 6.4 oz (106.3 kg)  08/08/22 231 lb 3.2 oz (104.9 kg)  07/26/22 232 lb 3.2 oz (105.3 kg)    Physical Exam Constitutional:      Appearance: Normal appearance. She is not ill-appearing.  HENT:     Nose: Nose normal.     Mouth/Throat:     Mouth: Mucous membranes are moist.  Eyes:     Extraocular Movements: Extraocular movements intact.  Cardiovascular:     Rate and Rhythm: Normal rate and regular rhythm.     Pulses: Normal pulses.     Heart sounds: Normal heart sounds.  Pulmonary:     Effort: Pulmonary effort is normal.     Breath sounds: Normal breath sounds.  Abdominal:     Palpations: Abdomen is soft.  Musculoskeletal:        General: Normal range of motion.  Neurological:     General: No focal deficit present.     Mental  Status: She is alert and oriented to person, place, and time. Mental status is at baseline.     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 denies   Diabetes mellitus without complication ([place])    type 2   Family history of breast cancer    Family history of lymphoma    Family history of prostate cancer    GERD (gastroesophageal reflux disease)    Hyperlipidemia    Hypertension    Hypothyroidism    Lung nodule    Thyroid disease      Family History  Problem Relation Age of Onset   Hypertension Mother    Hypertension Father    Prostate cancer Father 16   Diabetes Sister    Thyroid disease Sister    Breast cancer Sister 83   Breast cancer Sister 56   Lymphoma Sister 70   Breast cancer Maternal Aunt    Breast cancer Maternal Aunt    Cancer Paternal Grandmother        unk type     Past Surgical History:  Procedure Laterality Date   BREAST BIOPSY Left 11/25/2015   stereo  neg   BREAST EXCISIONAL BIOPSY Left yrs ago   benign   COLONOSCOPY WITH PROPOFOL N/A 04/20/2017   Procedure: COLONOSCOPY WITH PROPOFOL;  Surgeon: [name], MD;  Location: [facility] ENDOSCOPY;  Service: Endoscopy;  Laterality: N/A;   COLONOSCOPY WITH PROPOFOL N/A 01/08/2018   Procedure: COLONOSCOPY WITH PROPOFOL;  Surgeon: [name], MD;  Location: [facility] ENDOSCOPY;  Service: Gastroenterology;  Laterality: N/A;   COLONOSCOPY WITH PROPOFOL N/A 08/10/2018   Procedure: COLONOSCOPY WITH PROPOFOL;  Surgeon: [name], MD;  Location: [facility] ENDOSCOPY;  Service: Gastroenterology;  Laterality: N/A;   COLONOSCOPY WITH PROPOFOL N/A 02/08/2021   Procedure: COLONOSCOPY WITH PROPOFOL;  Surgeon: [name], MD;  Location: [facility] ENDOSCOPY;  Service: Gastroenterology;  Laterality: N/A;   IR RADIOLOGIST EVAL & MGMT  04/08/2020   IR RADIOLOGIST  EVAL &  MGMT  05/26/2020   IR RADIOLOGIST EVAL & MGMT  09/29/2020   IR RADIOLOGIST EVAL & MGMT  03/10/2021   IR RADIOLOGIST EVAL & MGMT  12/10/2021   RADIOLOGY WITH ANESTHESIA N/A 04/29/2020   Procedure: CRYOABLATION;  Surgeon: Arne Cleveland, MD;  Location: [facility] ORS;  Service: Radiology;  Laterality: N/A;   THYROIDECTOMY     Dr. [name]   VAGINAL DELIVERY     4    Social History   Socioeconomic History   Marital status: Married    Spouse name: Not on file   Number of children: 4   Years of education: Not on file   Highest education level: Not on file  Occupational History   Not on file  Tobacco Use   Smoking status: Former    Packs/day: 0.75    Years: 48.00    Total pack years: 36.00    Types: Cigarettes    Quit date: 2015    Years since quitting: 8.9   Smokeless tobacco: Former    Types: Snuff, Chew  Vaping Use   Vaping Use: Never used  Substance and Sexual Activity   Alcohol use: No   Drug use: No   Sexual activity: Not Currently  Other Topics Concern   Not on file  Social History Narrative   Lives in [place] with husband. Has 4 children.      4 grandchildren.      Work - Retired from [employer]- walking the track, 4x per week      Diet- regular      Social Determinants of Health   Financial Resource Strain: Low Risk  (04/21/2022)   Overall Financial Resource Strain (CARDIA)    Difficulty of Paying Living Expenses: Not hard at all  Food Insecurity: No Food Insecurity (04/21/2022)   Hunger Vital Sign    Worried About Running Out of Food in the Last Year: Never true    [place] in the Last Year: Never true  Transportation Needs: No Transportation Needs (04/21/2022)   PRAPARE - Hydrologist (Medical): No    Lack of Transportation (Non-Medical): No  Physical Activity: Sufficiently Active (04/21/2022)   Exercise Vital Sign    Days of Exercise per Week: 5 days    Minutes of Exercise per Session: 30 min  Stress: No  Stress Concern Present (04/21/2022)   [place]    Feeling of Stress : Not at all  Social Connections: [place] (04/21/2022)   Social Connection and Isolation Panel [NHANES]    Frequency of Communication with Friends and Family: More than three times a week    Frequency of Social Gatherings with Friends and Family: Once a week    Attends Religious Services: More than 4 times per year    Active Member of [employer] or Organizations: Yes    Attends Archivist Meetings: More than 4 times per year    Marital Status: Married  Human resources officer Violence: Not At Risk (04/21/2022)   Humiliation, Afraid, Rape, and Kick questionnaire    Fear of Current or Ex-Partner: No    Emotionally Abused: No    Physically Abused: No    Sexually Abused: No     No Known Allergies   CBC    Component Value Date/Time   WBC 3.4 (L) 04/07/2022 1026   RBC 4.38 04/07/2022 1026   HGB 13.6 04/07/2022 1026  HCT 41.3 04/07/2022 1026   PLT 280.0 04/07/2022 1026   MCV 94.2 04/07/2022 1026   MCH 31.1 11/03/2021 1042   MCHC 32.9 04/07/2022 1026   RDW 14.2 04/07/2022 1026   LYMPHSABS 1.1 04/07/2022 1026   MONOABS 0.6 04/07/2022 1026   EOSABS 0.2 04/07/2022 1026   BASOSABS 0.1 04/07/2022 1026    Pulmonary Functions Testing Results:     No data to display          Outpatient Medications Prior to Visit  Medication Sig Dispense Refill   albuterol (VENTOLIN HFA) 108 (90 Base) MCG/ACT inhaler Inhale 2 puffs into the lungs every 6 (six) hours as needed for wheezing or shortness of breath. 8 g 1   amLODipine (NORVASC) 10 MG tablet TAKE 1 TABLET BY MOUTH EVERY DAY 90 tablet 3   aspirin EC 81 MG tablet Take 81 mg by mouth daily. Swallow whole.     atorvastatin (LIPITOR) 20 MG tablet TAKE 1 TABLET BY MOUTH EVERY DAY 90 tablet 0   blood glucose meter kit and supplies KIT Dispense based on patient and insurance preference. Use up to four  times daily as directed. 1 each 0   Budeson-Glycopyrrol-Formoterol (BREZTRI AEROSPHERE) 160-9-4.8 MCG/ACT AERO Inhale 2 puffs into the lungs 2 (two) times daily. 10.7 g 11   Calcium Carbonate-Vit D-Min (CALCIUM 600+D PLUS MINERALS) 600-400 MG-UNIT TABS Take 1 tablet by mouth daily.      clotrimazole (LOTRIMIN) 1 % cream Apply 1 Application topically 2 (two) times daily. For 2 weeks 30 g 1   diclofenac sodium (VOLTAREN) 1 % GEL Apply 1 application  topically 4 (four) times daily as needed (knee pain.).     DULoxetine (CYMBALTA) 60 MG capsule TAKE 1 CAPSULE BY MOUTH EVERY DAY 90 capsule 3   hydrocortisone 2.5 % cream Apply dime size amount external vaginal twice daily for one week, once daily for one week, and every other day for one week, then stop. 30 g 1   Lancet Devices (ONE TOUCH DELICA LANCING DEV) MISC 1 Device by Does not apply route daily. 1 each 2   Lancets (ONETOUCH DELICA PLUS [id number]) MISC USE AS INSTRUCTED 100 each 12   levothyroxine (SYNTHROID) 137 MCG tablet TAKE 1 TABLET BY MOUTH EVERY DAY BEFORE BREAKFAST 90 tablet 1   losartan-hydrochlorothiazide (HYZAAR) 50-12.5 MG tablet Take 1 tablet by mouth daily. 90 tablet 3   meloxicam (MOBIC) 7.5 MG tablet Take 1 tablet (7.5 mg total) by mouth daily as needed for pain. 30 tablet 2   metFORMIN (GLUCOPHAGE) 500 MG tablet TAKE 1 TABLET BY MOUTH TWICE A DAY 180 tablet 1   metoprolol tartrate (LOPRESSOR) 25 MG tablet Take 1 tablet (25 mg total) by mouth 2 (two) times daily. 90 tablet 1   metroNIDAZOLE (FLAGYL) 500 MG tablet Take 1 tablet (500 mg total) by mouth 2 (two) times daily. 14 tablet 0   ONETOUCH ULTRA test strip TEST BLOOD SUGAR 1-2 TIMES A DAY 100 strip 5   No facility-administered medications prior to visit.

## 2022-08-17 NOTE — Patient Instructions (Signed)
Today, I ordered a breathing test to be performed prior to you seeing me in clinic. I have also sent a prescription for Anoro Ellipta, use it one puff once daily in place of the [place] (after your current [place] supply is complete).

## 2022-08-25 ENCOUNTER — Ambulatory Visit: Payer: Medicare Other | Admitting: Podiatry

## 2022-08-25 ENCOUNTER — Encounter: Payer: Self-pay | Admitting: Podiatry

## 2022-08-25 VITALS — BP 130/81 | HR 99

## 2022-08-25 DIAGNOSIS — E1140 Type 2 diabetes mellitus with diabetic neuropathy, unspecified: Secondary | ICD-10-CM

## 2022-08-25 DIAGNOSIS — B351 Tinea unguium: Secondary | ICD-10-CM | POA: Diagnosis not present

## 2022-08-25 DIAGNOSIS — M79675 Pain in left toe(s): Secondary | ICD-10-CM | POA: Diagnosis not present

## 2022-08-25 DIAGNOSIS — M79674 Pain in right toe(s): Secondary | ICD-10-CM

## 2022-08-25 NOTE — Progress Notes (Signed)

## 2022-08-29 DIAGNOSIS — M5441 Lumbago with sciatica, right side: Secondary | ICD-10-CM | POA: Diagnosis not present

## 2022-08-30 ENCOUNTER — Ambulatory Visit: Payer: Medicare Other | Admitting: Family

## 2022-09-05 ENCOUNTER — Ambulatory Visit: Payer: Medicare Other | Admitting: Family

## 2022-09-05 ENCOUNTER — Ambulatory Visit
Admission: RE | Admit: 2022-09-05 | Discharge: 2022-09-05 | Disposition: A | Discharge status: Home / Self Care | Payer: Medicare Other | Source: Ambulatory Visit | Attending: Family | Admitting: Family

## 2022-09-05 DIAGNOSIS — N289 Disorder of kidney and ureter, unspecified: Secondary | ICD-10-CM | POA: Diagnosis not present

## 2022-09-05 DIAGNOSIS — N1330 Unspecified hydronephrosis: Secondary | ICD-10-CM | POA: Insufficient documentation

## 2022-09-08 ENCOUNTER — Telehealth: Payer: Self-pay | Admitting: *Deleted

## 2022-09-08 DIAGNOSIS — M545 Low back pain, unspecified: Secondary | ICD-10-CM

## 2022-09-08 NOTE — Telephone Encounter (Signed)
Left voicemail for pt to call back for results. See below:  Ultrasound reveals persistent fluid in the right kidney.  I am sharing this image with oncologist, Dr. Tasia Catchings whom she follows with.  Please let her know that I will reach back out to her once I speak with Dr. Tasia Catchings

## 2022-09-21 ENCOUNTER — Telehealth: Payer: Self-pay | Admitting: Family

## 2022-09-21 NOTE — Telephone Encounter (Signed)
Patient would like to speak Cynthia [name] about last weeks phone call about her kidneys.

## 2022-09-23 ENCOUNTER — Other Ambulatory Visit: Payer: Self-pay | Admitting: Family

## 2022-09-23 DIAGNOSIS — N2889 Other specified disorders of kidney and ureter: Secondary | ICD-10-CM

## 2022-09-23 MED ORDER — TRAMADOL HCL 50 MG PO TABS: 50.0000 mg | ORAL_TABLET | Freq: Two times a day (BID) | ORAL | 0 refills | Status: DC | PRN

## 2022-09-23 NOTE — Telephone Encounter (Signed)
Spoke with patient  She complains of low back pain, right leg, right knee pain x for couple of weeks. She is taking mobic 7.'5mg'$  bid. Pain when laying on hips when sleeping.  Compliant with cymbalta '60mg'$ .   Seen at  urgent care 08/29/22 . No xrays done. Prednisone '20mg'$  x 5 days was very helpful. Pain improved with prednisone.   Denies numbness, trouble urinating, urinary incontinence.   No h/o seizure. No alcohol use.   NO metal in body.   MRI abdomen 10/01/22.   Plan:  Start tramadol Discussed side effects.  Appt will be scheduled for patient early next week. Plan to obtain imaging.  Advised on

## 2022-09-23 NOTE — Telephone Encounter (Signed)
Spoke to pt and scheduled for in office appt for 09/27/22 '@9'$ :30 am

## 2022-09-23 NOTE — Telephone Encounter (Signed)
Cynthia [name] pool   Call pt I tried to call pt however no VM and I was unable to leave message  I see in your message you discussed pain.  Can you be more specific ?  Please move her appt to early next week.   In chart, she was see at her urgent care for back pain.   Please advise patient that I ordered MRI of her abdomen to investigate abnormality seen on ultrasound of  the right kidney .  I consulted with Dr. Tasia Catchings, her oncologist in this regard  Please confirm no metal in body.    Cynthia [name], how soon can we schedule MRI abdomen?

## 2022-09-23 NOTE — Telephone Encounter (Signed)
-----   Message from Earlie Server, MD sent at 09/16/2022  2:31 PM EST ----- Hi [name],  I think MRI abdomen w wo will help.  She is due for MRI abdomen w wo in March 2024 anyway. Thanks for ordering.   [place]  ----- Message ----- From: Burnard Hawthorne, FNP Sent: 09/16/2022   1:02 PM EST To: Earlie Server, MD  Dr Tasia Catchings,   I wanted to share recent renal ultrasound with you due to patient's h/o  left renal carcinoma. You recently saw her in August.  I ordered ultrasound renal due to fullness noted in the right collecting system on a CT chest 05/24/22.  Concerned in regards to right-sided hydronephrosis. Would you recommend CT renal and then follow up with you? Would MRI abdomen be more appropriate?   I am happy to order so you have when you see patient in February.   Let me know your thoughts.  Cynthia [name]

## 2022-09-27 ENCOUNTER — Ambulatory Visit (INDEPENDENT_AMBULATORY_CARE_PROVIDER_SITE_OTHER): Payer: Medicare Other

## 2022-09-27 ENCOUNTER — Encounter: Payer: Self-pay | Admitting: Family

## 2022-09-27 ENCOUNTER — Telehealth: Payer: Self-pay | Admitting: Family

## 2022-09-27 ENCOUNTER — Ambulatory Visit (INDEPENDENT_AMBULATORY_CARE_PROVIDER_SITE_OTHER): Payer: Medicare Other | Admitting: Family

## 2022-09-27 VITALS — BP 136/84 | HR 81 | Temp 98.7°F | Ht 68.0 in | Wt 231.0 lb

## 2022-09-27 DIAGNOSIS — M25561 Pain in right knee: Secondary | ICD-10-CM | POA: Diagnosis not present

## 2022-09-27 DIAGNOSIS — G8929 Other chronic pain: Secondary | ICD-10-CM

## 2022-09-27 DIAGNOSIS — M545 Low back pain, unspecified: Secondary | ICD-10-CM

## 2022-09-27 DIAGNOSIS — M533 Sacrococcygeal disorders, not elsewhere classified: Secondary | ICD-10-CM

## 2022-09-27 DIAGNOSIS — I10 Essential (primary) hypertension: Secondary | ICD-10-CM | POA: Diagnosis not present

## 2022-09-27 DIAGNOSIS — M25552 Pain in left hip: Secondary | ICD-10-CM | POA: Diagnosis not present

## 2022-09-27 DIAGNOSIS — M25562 Pain in left knee: Secondary | ICD-10-CM

## 2022-09-27 DIAGNOSIS — M1990 Unspecified osteoarthritis, unspecified site: Secondary | ICD-10-CM | POA: Diagnosis not present

## 2022-09-27 DIAGNOSIS — M25551 Pain in right hip: Secondary | ICD-10-CM | POA: Diagnosis not present

## 2022-09-27 DIAGNOSIS — M5136 Other intervertebral disc degeneration, lumbar region: Secondary | ICD-10-CM | POA: Diagnosis not present

## 2022-09-27 MED ORDER — LOSARTAN POTASSIUM 50 MG PO TABS: 50.0000 mg | ORAL_TABLET | Freq: Every day | ORAL | 1 refills | Status: DC

## 2022-09-27 MED ORDER — HYDROCHLOROTHIAZIDE 25 MG PO TABS: 25.0000 mg | ORAL_TABLET | Freq: Every day | ORAL | 3 refills | Status: DC

## 2022-09-27 MED ORDER — MELOXICAM 7.5 MG PO TABS: 7.5000 mg | ORAL_TABLET | Freq: Every day | ORAL | 2 refills | Status: DC | PRN

## 2022-09-27 MED ORDER — AMLODIPINE BESYLATE 2.5 MG PO TABS: 5.0000 mg | ORAL_TABLET | Freq: Every day | ORAL | 1 refills | Status: DC

## 2022-09-27 NOTE — Telephone Encounter (Signed)
Can you burn CD of all xrays from today so she can take with her to [place]?  Please let her know once done

## 2022-09-27 NOTE — Progress Notes (Unsigned)
Assessment & Plan:  There are no diagnoses linked to this encounter.   Return precautions given.   Risks, benefits, and alternatives of the medications and treatment plan prescribed today were discussed, and patient expressed understanding.   Education regarding symptom management and diagnosis given to patient on AVS either electronically or printed.  No follow-ups on file.  Mable Paris, FNP  Subjective:    Patient ID: Cynthia [name], female    DOB: [birth year], 77 y.o.   MRN: [id number]  CC: Cynthia [name] is a 77 y.o. female who presents today for an acute visit.    HPI: Complains of bilateral leg swelling gradually over the past couple weeks.  Legs feel sore.   No sob, orthopnea, CP, weight gain.   Seen in UC 08/29/22 for low back pain, completed prednisone and mobic with some relief.   She complains of right groin and right sided buttocks pain. Both knees are painful.  Right knee is more painful than left.  She is wearing a right knee brace.   no falls, injury She is using a cane to prevent falling.   I called in tramadol 09/23/22 after our phone conversation. She hasn't taken.    No numbness, saddle anesthesia, urinary or fecal incontinence, numbness.    She is compliant with losartan hydrochlorothiazide 50-12.5 mg daily, metoprolol 25 mg twice daily, amlodipine 10 mg daily   MR abdomen scheduled for 10/01/22 Follow up Dr Tasia Catchings 11/13/22  No h/o ckd  No alcohol use No h/o seizures.   Allergies: Patient has no known allergies. Current Outpatient Medications on File Prior to Visit  Medication Sig Dispense Refill   albuterol (VENTOLIN HFA) 108 (90 Base) MCG/ACT inhaler Inhale 2 puffs into the lungs every 6 (six) hours as needed for wheezing or shortness of breath. 8 g 1   amLODipine (NORVASC) 10 MG tablet TAKE 1 TABLET BY MOUTH EVERY DAY 90 tablet 3   aspirin EC 81 MG tablet Take 81 mg by mouth daily. Swallow whole.     atorvastatin (LIPITOR) 20 MG tablet TAKE  1 TABLET BY MOUTH EVERY DAY 90 tablet 0   blood glucose meter kit and supplies KIT Dispense based on patient and insurance preference. Use up to four times daily as directed. 1 each 0   Budeson-Glycopyrrol-Formoterol (BREZTRI AEROSPHERE) 160-9-4.8 MCG/ACT AERO Inhale 2 puffs into the lungs 2 (two) times daily. 10.7 g 11   Calcium Carbonate-Vit D-Min (CALCIUM 600+D PLUS MINERALS) 600-400 MG-UNIT TABS Take 1 tablet by mouth daily.      clotrimazole (LOTRIMIN) 1 % cream Apply 1 Application topically 2 (two) times daily. For 2 weeks 30 g 1   diclofenac sodium (VOLTAREN) 1 % GEL Apply 1 application  topically 4 (four) times daily as needed (knee pain.).     DULoxetine (CYMBALTA) 60 MG capsule TAKE 1 CAPSULE BY MOUTH EVERY DAY 90 capsule 3   hydrocortisone 2.5 % cream Apply dime size amount external vaginal twice daily for one week, once daily for one week, and every other day for one week, then stop. 30 g 1   Lancet Devices (ONE TOUCH DELICA LANCING DEV) MISC 1 Device by Does not apply route daily. 1 each 2   Lancets (ONETOUCH DELICA PLUS [id number]) MISC USE AS INSTRUCTED 100 each 12   levothyroxine (SYNTHROID) 137 MCG tablet TAKE 1 TABLET BY MOUTH EVERY DAY BEFORE BREAKFAST 90 tablet 1   losartan-hydrochlorothiazide (HYZAAR) 50-12.5 MG tablet Take 1 tablet by mouth daily. 90 tablet 3  meloxicam (MOBIC) 7.5 MG tablet Take 1 tablet (7.5 mg total) by mouth daily as needed for pain. 30 tablet 2   metFORMIN (GLUCOPHAGE) 500 MG tablet TAKE 1 TABLET BY MOUTH TWICE A DAY 180 tablet 1   metoprolol tartrate (LOPRESSOR) 25 MG tablet Take 1 tablet (25 mg total) by mouth 2 (two) times daily. 90 tablet 1   ONETOUCH ULTRA test strip TEST BLOOD SUGAR 1-2 TIMES A DAY 100 strip 5   traMADol (ULTRAM) 50 MG tablet Take 1 tablet (50 mg total) by mouth every 12 (twelve) hours as needed for severe pain. 30 tablet 0   umeclidinium-vilanterol (ANORO ELLIPTA) 62.5-25 MCG/ACT AEPB Inhale 1 puff into the lungs daily. 30 each 11    No current facility-administered medications on file prior to visit.    Review of Systems    Objective:    BP 136/84   Pulse 81   Temp 98.7 F (37.1 C) (Oral)   Ht '5\' 8"'$  (1.727 m)   Wt 231 lb (104.8 kg)   SpO2 97%   BMI 35.12 kg/m   BP Readings from Last 3 Encounters:  09/27/22 136/84  08/25/22 130/81  08/17/22 120/80   Wt Readings from Last 3 Encounters:  09/27/22 231 lb (104.8 kg)  08/17/22 234 lb 6.4 oz (106.3 kg)  08/08/22 231 lb 3.2 oz (104.9 kg)    Physical Exam

## 2022-09-27 NOTE — Patient Instructions (Addendum)
For leg swelling, I suspect amlodipine is contributing .  Decrease amlodipine to '5mg'$  from '10mg'$ .    I provided you with 2.5 mg of amlodipine tablets so if you continue to have swelling we will further decrease from 5 mg amlodipine daily to 2.5 mg amlodipine daily.    In its place of  decreasing amlodipine, we will increase hydrochlorothiazide from 12.5 mg daily to 25 mg daily.  You will continue losartan 50 mg daily.  Please consider purchasing compression stockings   We need to create a toolbox for pain:   Please purchase over the counter salon pas pain patch.   As discussed, let's start by scheduling Tylenol Arthritis which is a '650mg'$  tablet .   You may take 1-2 tablets every 8 hours ( scheduled) with maximum of 6 tablets per day.   For example , you could take two tablets in the morning ( 8am) and then two tablets again at 4pm.   Maximum daily dose of acetaminophen 4 g per day from all sources.  If you are taking another medication which includes acetaminophen (Tylenol) which may be in cough and cold preparations or pain medication such as Percocet, you will need to factor that into your total daily dose to be safe.  Please let me know if any questions  You may use mobic ( antiinflammatory) as needed for pain as well.   A couple of points in regards to meloxicam ( Mobic) -  This medication is not intended for daily , long term use. It is a potent anti inflammatory ( NSAID), and my intention is for you take as needed for moderate to severe pain. If you find yourself using daily, please let me know.   Please takes Mobic ( meloxicam) with FOOD since it is an anti-inflammatory as it can cause a GI bleed or ulcer. If you have a history of GI bleed or ulcer, please do NOT take.  Do no take over the counter aleve, motrin, advil, goody's powder for pain as they are also NSAIDs, and they are  in the same class as Mobic  Lastly, we will need to monitor kidney function while on Mobic, and if we  were to see any decline in kidney function in the future, we would have to discontinue this medication.   You may use tramadol , opioid like medication, for pain control. This medication should be saved for severe pain days.

## 2022-09-28 NOTE — Telephone Encounter (Signed)
CD burned & reports printed & placed up front for pickup.  Left voicemail to notify patient as well.

## 2022-09-28 NOTE — Assessment & Plan Note (Addendum)
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  Long discussion as a relates to pain control and options thereof.  Provided her with tramadol last week however patient has been hesitant to start.  Advised her that she may start using tramadol once per day safely for improvement of quality of life and pain control.  We discussed tramadol is a controlled substance and importance of storing in a safe place . discussed lidocaine pain patches with patient, Tylenol arthritis , and mobic prn with her as other options.  Close follow-up

## 2022-09-28 NOTE — Assessment & Plan Note (Addendum)
Severe arthritic changes seen on bilateral x-ray films.  Concerned that patient is most likely a candidate for TKR.  She may temporarily benefit from cortisone injection.  Referral has been placed to orthopedics.

## 2022-09-28 NOTE — Assessment & Plan Note (Signed)
I suspect amlodipine is contributing . Advised to decrease amlodipine to '5mg'$  from '10mg'$ .Increase hydrochlorothiazide from 12.5 mg daily to 25 mg daily.  Continue losartan 50 mg daily.Advised compression stockings.

## 2022-09-29 NOTE — Telephone Encounter (Signed)
Pt called in regarding the xray F/U. She's aware of her CD been here at front, however she would like to know if provider its the one making her appt for [facility]? She would like a call back.

## 2022-09-30 NOTE — Telephone Encounter (Signed)
Spoke to pt she just wanted the number to [facility]. I provided info and pt verbalized understanding

## 2022-10-01 ENCOUNTER — Ambulatory Visit
Admission: RE | Admit: 2022-10-01 | Discharge: 2022-10-01 | Disposition: A | Discharge status: Home / Self Care | Payer: Medicare Other | Source: Ambulatory Visit | Attending: Family | Admitting: Family

## 2022-10-01 DIAGNOSIS — N2889 Other specified disorders of kidney and ureter: Secondary | ICD-10-CM | POA: Diagnosis not present

## 2022-10-01 DIAGNOSIS — C642 Malignant neoplasm of left kidney, except renal pelvis: Secondary | ICD-10-CM | POA: Diagnosis not present

## 2022-10-01 MED ORDER — GADOBUTROL 1 MMOL/ML IV SOLN
10.0000 mL | Freq: Once | INTRAVENOUS | Status: AC | PRN
  Administered 2022-10-01: 10 mL via INTRAVENOUS

## 2022-10-03 ENCOUNTER — Ambulatory Visit: Payer: Medicare Other | Admitting: Family

## 2022-10-05 ENCOUNTER — Encounter: Payer: Self-pay | Admitting: Family

## 2022-10-05 ENCOUNTER — Ambulatory Visit (INDEPENDENT_AMBULATORY_CARE_PROVIDER_SITE_OTHER): Payer: Medicare Other | Admitting: Family

## 2022-10-05 VITALS — BP 112/72 | HR 71 | Temp 98.2°F | Ht 68.0 in | Wt 226.8 lb

## 2022-10-05 DIAGNOSIS — E039 Hypothyroidism, unspecified: Secondary | ICD-10-CM | POA: Diagnosis not present

## 2022-10-05 DIAGNOSIS — E1140 Type 2 diabetes mellitus with diabetic neuropathy, unspecified: Secondary | ICD-10-CM | POA: Diagnosis not present

## 2022-10-05 DIAGNOSIS — M545 Low back pain, unspecified: Secondary | ICD-10-CM | POA: Diagnosis not present

## 2022-10-05 DIAGNOSIS — M1990 Unspecified osteoarthritis, unspecified site: Secondary | ICD-10-CM

## 2022-10-05 DIAGNOSIS — M533 Sacrococcygeal disorders, not elsewhere classified: Secondary | ICD-10-CM

## 2022-10-05 DIAGNOSIS — I10 Essential (primary) hypertension: Secondary | ICD-10-CM

## 2022-10-05 DIAGNOSIS — M25561 Pain in right knee: Secondary | ICD-10-CM

## 2022-10-05 DIAGNOSIS — G8929 Other chronic pain: Secondary | ICD-10-CM | POA: Diagnosis not present

## 2022-10-05 DIAGNOSIS — E785 Hyperlipidemia, unspecified: Secondary | ICD-10-CM | POA: Diagnosis not present

## 2022-10-05 DIAGNOSIS — M25562 Pain in left knee: Secondary | ICD-10-CM | POA: Diagnosis not present

## 2022-10-05 MED ORDER — TRAMADOL HCL 50 MG PO TABS: 50.0000 mg | ORAL_TABLET | Freq: Two times a day (BID) | ORAL | 2 refills | Status: DC | PRN

## 2022-10-05 NOTE — Progress Notes (Signed)
Assessment & Plan:  Primary hypertension Assessment & Plan: Blood pressure is excellent Leg swelling resolved on lower dose of amlodipine. Continue amlodipine to 5 mg QD, hydrochlorothiazide  25 mg QD, losartan 50 mg QD   Type 2 diabetes mellitus with diabetic neuropathy, without long-term current use of insulin (HCC)  Hypothyroidism, unspecified type  Arthritis  Sacroiliac joint disease  Hyperlipidemia, unspecified hyperlipidemia type  Acute midline low back pain without sciatica -     traMADol HCl; Take 1 tablet (50 mg total) by mouth every 12 (twelve) hours as needed for severe pain.  Dispense: 60 tablet; Refill: 2  Chronic pain of both knees Assessment & Plan: Knee pain is improved with tramadol 50 mg twice daily.  Will continue medication.  Refilled today. will follow as patient sees [facility] in 2 days.       Return precautions given.   Risks, benefits, and alternatives of the medications and treatment plan prescribed today were discussed, and patient expressed understanding.   Education regarding symptom management and diagnosis given to patient on AVS either electronically or printed.  Return in about 3 months (around 01/04/2023).  Mable Paris, FNP  Subjective:    Patient ID: [name], female    DOB: [birth year], 77 y.o.   MRN: [id number]  CC: EULALA NEWCOMBE is a 77 y.o. female who presents today for follow up.   HPI: Feels well today  No new concerns    Continues to complain of bilateral knee pain. Right knee most bothersome.  She has upcoming appointment with [facility] in 2 days. He has been taking tramadol '50mg'$  BID. She is not too sedating on medication and pain significantly improved.   Decreased amlodipine to 5 mg and increased hydrochlorothiazide from 12.5 mg daily to 25 mg daily.  She is compliant with losartan 50 mg a  Leg swelling improved. She is wearing compression stockings.  Allergies: Patient has no known  allergies. Current Outpatient Medications on File Prior to Visit  Medication Sig Dispense Refill   amLODipine (NORVASC) 2.5 MG tablet Take 2 tablets (5 mg total) by mouth daily. 90 tablet 1   aspirin EC 81 MG tablet Take 81 mg by mouth daily. Swallow whole.     atorvastatin (LIPITOR) 20 MG tablet TAKE 1 TABLET BY MOUTH EVERY DAY 90 tablet 0   blood glucose meter kit and supplies KIT Dispense based on patient and insurance preference. Use up to four times daily as directed. 1 each 0   Calcium Carbonate-Vit D-Min (CALCIUM 600+D PLUS MINERALS) 600-400 MG-UNIT TABS Take 1 tablet by mouth daily.      diclofenac sodium (VOLTAREN) 1 % GEL Apply 1 application  topically 4 (four) times daily as needed (knee pain.).     hydrochlorothiazide (HYDRODIURIL) 25 MG tablet Take 1 tablet (25 mg total) by mouth daily. 90 tablet 3   Lancet Devices (ONE TOUCH DELICA LANCING DEV) MISC 1 Device by Does not apply route daily. 1 each 2   Lancets (ONETOUCH DELICA PLUS [id number]) MISC USE AS INSTRUCTED 100 each 12   levothyroxine (SYNTHROID) 137 MCG tablet TAKE 1 TABLET BY MOUTH EVERY DAY BEFORE BREAKFAST 90 tablet 1   losartan (COZAAR) 50 MG tablet Take 1 tablet (50 mg total) by mouth daily. 90 tablet 1   meloxicam (MOBIC) 7.5 MG tablet Take 1 tablet (7.5 mg total) by mouth daily as needed for pain. 30 tablet 2   metFORMIN (GLUCOPHAGE) 500 MG tablet TAKE 1 TABLET BY MOUTH TWICE A DAY  180 tablet 1   metoprolol tartrate (LOPRESSOR) 25 MG tablet Take 1 tablet (25 mg total) by mouth 2 (two) times daily. 90 tablet 1   ONETOUCH ULTRA test strip TEST BLOOD SUGAR 1-2 TIMES A DAY 100 strip 5   umeclidinium-vilanterol (ANORO ELLIPTA) 62.5-25 MCG/ACT AEPB Inhale 1 puff into the lungs daily. 30 each 11   No current facility-administered medications on file prior to visit.    Review of Systems  Constitutional:  Negative for chills and fever.  Respiratory:  Negative for cough.   Cardiovascular:  Negative for chest pain,  palpitations and leg swelling.  Gastrointestinal:  Negative for nausea and vomiting.  Musculoskeletal:  Positive for arthralgias (knees, low back).      Objective:    BP 112/72   Pulse 71   Temp 98.2 F (36.8 C)   Ht '5\' 8"'$  (1.727 m)   Wt 226 lb 12.8 oz (102.9 kg)   SpO2 99%   BMI 34.48 kg/m  BP Readings from Last 3 Encounters:  10/05/22 112/72  09/27/22 136/84  08/25/22 130/81   Wt Readings from Last 3 Encounters:  10/05/22 226 lb 12.8 oz (102.9 kg)  09/27/22 231 lb (104.8 kg)  08/17/22 234 lb 6.4 oz (106.3 kg)    Physical Exam Vitals reviewed.  Constitutional:      Appearance: She is well-developed.  Eyes:     Conjunctiva/sclera: Conjunctivae normal.  Cardiovascular:     Rate and Rhythm: Normal rate and regular rhythm.     Pulses: Normal pulses.     Heart sounds: Normal heart sounds.     Comments: She is wearing compression stockings Pulmonary:     Effort: Pulmonary effort is normal.     Breath sounds: Normal breath sounds. No wheezing, rhonchi or rales.  Musculoskeletal:     Right lower leg: No edema.     Left lower leg: No edema.  Skin:    General: Skin is warm and dry.  Neurological:     Mental Status: She is alert.  Psychiatric:        Speech: Speech normal.        Behavior: Behavior normal.        Thought Content: Thought content normal.

## 2022-10-05 NOTE — Assessment & Plan Note (Signed)
Blood pressure is excellent Leg swelling resolved on lower dose of amlodipine. Continue amlodipine to 5 mg QD, hydrochlorothiazide  25 mg QD, losartan 50 mg QD

## 2022-10-05 NOTE — Assessment & Plan Note (Signed)
Knee pain is improved with tramadol 50 mg twice daily.  Will continue medication.  Refilled today. will follow as patient sees [facility] in 2 days.

## 2022-10-07 DIAGNOSIS — M170 Bilateral primary osteoarthritis of knee: Secondary | ICD-10-CM | POA: Diagnosis not present

## 2022-10-21 ENCOUNTER — Ambulatory Visit (INDEPENDENT_AMBULATORY_CARE_PROVIDER_SITE_OTHER): Payer: Medicare Other | Admitting: Family

## 2022-10-21 ENCOUNTER — Encounter: Payer: Self-pay | Admitting: Family

## 2022-10-21 VITALS — BP 128/82 | HR 90 | Temp 97.7°F | Ht 69.0 in | Wt 217.6 lb

## 2022-10-21 DIAGNOSIS — R2241 Localized swelling, mass and lump, right lower limb: Secondary | ICD-10-CM | POA: Diagnosis not present

## 2022-10-21 DIAGNOSIS — M545 Low back pain, unspecified: Secondary | ICD-10-CM

## 2022-10-21 MED ORDER — DULOXETINE HCL 60 MG PO CPEP: 60.0000 mg | ORAL_CAPSULE | Freq: Every day | ORAL | 3 refills | Status: DC

## 2022-10-21 NOTE — Assessment & Plan Note (Addendum)
Mass or knot patient as appreciated is the talus. Nontender and perhaps slightly more prominent than left talus. We discussed likely as pedal edema has decreased on lower dose of amlodipine, this has become more prominent. In the absence of pain, patient politely declines xray which I think is reasonable. We will monitor for now.

## 2022-10-21 NOTE — Progress Notes (Signed)
Assessment & Plan:  Left-sided low back pain without sciatica, unspecified chronicity -     DULoxetine HCl; Take 1 capsule (60 mg total) by mouth daily.  Dispense: 90 capsule; Refill: 3  Mass of ankle, right Assessment & Plan: Mass or knot patient as appreciated is the talus. Nontender and perhaps slightly more prominent than left talus. We discussed likely as pedal edema has decreased on lower dose of amlodipine, this has become more prominent. In the absence of pain, patient politely declines xray which I think is reasonable. We will monitor for now.       Return precautions given.   Risks, benefits, and alternatives of the medications and treatment plan prescribed today were discussed, and patient expressed understanding.   Education regarding symptom management and diagnosis given to patient on AVS either electronically or printed.  Return in about 3 months (around 01/19/2023).  Mable Paris, FNP  Subjective:    Patient ID: Cynthia [name], female    DOB: 1946-05-26, 77 y.o.   MRN: [id number]  CC: Cynthia [name] is a 77 y.o. female who presents today for an acute visit.    HPI: Complains of 'knot' on right ankle, noticed 2 days ago when taking a shower. She feels that it is a bone but doesn't feel it on the other ankle.  Ankle is not source of pain.   No numbness in legs, leg swelling, rash.         Compliant with cymbalta '60mg'$  qd. She requests refill.   HTN- compliant with amlodipine to 5 mg QD, hydrochlorothiazide  25 mg QD, losartan 50 mg QD   Allergies: Patient has no known allergies. Current Outpatient Medications on File Prior to Visit  Medication Sig Dispense Refill   amLODipine (NORVASC) 2.5 MG tablet Take 2 tablets (5 mg total) by mouth daily. 90 tablet 1   aspirin EC 81 MG tablet Take 81 mg by mouth daily. Swallow whole.     atorvastatin (LIPITOR) 20 MG tablet TAKE 1 TABLET BY MOUTH EVERY DAY 90 tablet 0   blood glucose meter kit and supplies KIT  Dispense based on patient and insurance preference. Use up to four times daily as directed. 1 each 0   Calcium Carbonate-Vit D-Min (CALCIUM 600+D PLUS MINERALS) 600-400 MG-UNIT TABS Take 1 tablet by mouth daily.      diclofenac sodium (VOLTAREN) 1 % GEL Apply 1 application  topically 4 (four) times daily as needed (knee pain.).     hydrochlorothiazide (HYDRODIURIL) 25 MG tablet Take 1 tablet (25 mg total) by mouth daily. 90 tablet 3   Lancet Devices (ONE TOUCH DELICA LANCING DEV) MISC 1 Device by Does not apply route daily. 1 each 2   Lancets (ONETOUCH DELICA PLUS [id number]) MISC USE AS INSTRUCTED 100 each 12   levothyroxine (SYNTHROID) 137 MCG tablet TAKE 1 TABLET BY MOUTH EVERY DAY BEFORE BREAKFAST 90 tablet 1   losartan (COZAAR) 50 MG tablet Take 1 tablet (50 mg total) by mouth daily. 90 tablet 1   meloxicam (MOBIC) 7.5 MG tablet Take 1 tablet (7.5 mg total) by mouth daily as needed for pain. 30 tablet 2   metFORMIN (GLUCOPHAGE) 500 MG tablet TAKE 1 TABLET BY MOUTH TWICE A DAY 180 tablet 1   metoprolol tartrate (LOPRESSOR) 25 MG tablet Take 1 tablet (25 mg total) by mouth 2 (two) times daily. 90 tablet 1   ONETOUCH ULTRA test strip TEST BLOOD SUGAR 1-2 TIMES A DAY 100 strip 5   traMADol (  ULTRAM) 50 MG tablet Take 1 tablet (50 mg total) by mouth every 12 (twelve) hours as needed for severe pain. 60 tablet 2   umeclidinium-vilanterol (ANORO ELLIPTA) 62.5-25 MCG/ACT AEPB Inhale 1 puff into the lungs daily. 30 each 11   No current facility-administered medications on file prior to visit.    Review of Systems  Constitutional:  Negative for chills and fever.  Respiratory:  Negative for cough.   Cardiovascular:  Negative for chest pain, palpitations and leg swelling.  Gastrointestinal:  Negative for nausea and vomiting.  Musculoskeletal:  Positive for arthralgias (knee pain).  Neurological:  Negative for numbness.      Objective:    BP 128/82   Pulse 90   Temp 97.7 F (36.5 C) (Oral)    Ht '5\' 9"'$  (1.753 m)   Wt 217 lb 9.6 oz (98.7 kg)   SpO2 99%   BMI 32.13 kg/m   BP Readings from Last 3 Encounters:  10/21/22 128/82  10/05/22 112/72  09/27/22 136/84   Wt Readings from Last 3 Encounters:  10/21/22 217 lb 9.6 oz (98.7 kg)  10/05/22 226 lb 12.8 oz (102.9 kg)  09/27/22 231 lb (104.8 kg)    Physical Exam Vitals reviewed.  Constitutional:      Appearance: She is well-developed.  Eyes:     Conjunctiva/sclera: Conjunctivae normal.  Cardiovascular:     Rate and Rhythm: Normal rate and regular rhythm.     Pulses: Normal pulses.     Heart sounds: Normal heart sounds.  Pulmonary:     Effort: Pulmonary effort is normal.     Breath sounds: Normal breath sounds. No wheezing, rhonchi or rales.  Musculoskeletal:     Right ankle: No swelling or deformity. No tenderness. Normal range of motion.     Left ankle: No swelling or deformity. No tenderness. Normal range of motion.     Right foot: No tenderness or bony tenderness.     Left foot: No tenderness or bony tenderness.       Legs:     Comments: Prominence of the talus bone appreciated right medial ankle when compared to left medial ankle. Nontender, nonfluctuant.  Full ROM of ankle without pain.   Skin:    General: Skin is warm and dry.  Neurological:     Mental Status: She is alert.  Psychiatric:        Speech: Speech normal.        Behavior: Behavior normal.        Thought Content: Thought content normal.

## 2022-10-24 DIAGNOSIS — M25551 Pain in right hip: Secondary | ICD-10-CM | POA: Diagnosis not present

## 2022-10-24 DIAGNOSIS — S83206A Unspecified tear of unspecified meniscus, current injury, right knee, initial encounter: Secondary | ICD-10-CM | POA: Diagnosis not present

## 2022-10-24 DIAGNOSIS — M25561 Pain in right knee: Secondary | ICD-10-CM | POA: Diagnosis not present

## 2022-10-26 DIAGNOSIS — M25561 Pain in right knee: Secondary | ICD-10-CM | POA: Diagnosis not present

## 2022-10-28 DIAGNOSIS — M1711 Unilateral primary osteoarthritis, right knee: Secondary | ICD-10-CM | POA: Diagnosis not present

## 2022-11-03 ENCOUNTER — Inpatient Hospital Stay: Payer: Medicare Other

## 2022-11-03 ENCOUNTER — Inpatient Hospital Stay: Payer: Medicare Other | Admitting: Oncology

## 2022-11-03 ENCOUNTER — Ambulatory Visit: Payer: Medicare Other | Admitting: Podiatry

## 2022-11-03 DIAGNOSIS — M6281 Muscle weakness (generalized): Secondary | ICD-10-CM | POA: Diagnosis not present

## 2022-11-03 DIAGNOSIS — R262 Difficulty in walking, not elsewhere classified: Secondary | ICD-10-CM | POA: Diagnosis not present

## 2022-11-03 DIAGNOSIS — M1711 Unilateral primary osteoarthritis, right knee: Secondary | ICD-10-CM | POA: Diagnosis not present

## 2022-11-08 ENCOUNTER — Ambulatory Visit: Payer: Medicare Other | Admitting: Family

## 2022-11-08 DIAGNOSIS — M1711 Unilateral primary osteoarthritis, right knee: Secondary | ICD-10-CM | POA: Diagnosis not present

## 2022-11-08 DIAGNOSIS — M6281 Muscle weakness (generalized): Secondary | ICD-10-CM | POA: Diagnosis not present

## 2022-11-08 DIAGNOSIS — R262 Difficulty in walking, not elsewhere classified: Secondary | ICD-10-CM | POA: Diagnosis not present

## 2022-11-10 DIAGNOSIS — M1711 Unilateral primary osteoarthritis, right knee: Secondary | ICD-10-CM | POA: Diagnosis not present

## 2022-11-10 DIAGNOSIS — R262 Difficulty in walking, not elsewhere classified: Secondary | ICD-10-CM | POA: Diagnosis not present

## 2022-11-10 DIAGNOSIS — M6281 Muscle weakness (generalized): Secondary | ICD-10-CM | POA: Diagnosis not present

## 2022-11-11 ENCOUNTER — Other Ambulatory Visit: Payer: Self-pay | Admitting: Family

## 2022-11-11 DIAGNOSIS — E785 Hyperlipidemia, unspecified: Secondary | ICD-10-CM

## 2022-11-13 ENCOUNTER — Other Ambulatory Visit: Payer: Self-pay | Admitting: Family

## 2022-11-13 DIAGNOSIS — E039 Hypothyroidism, unspecified: Secondary | ICD-10-CM

## 2022-11-15 DIAGNOSIS — M1711 Unilateral primary osteoarthritis, right knee: Secondary | ICD-10-CM | POA: Diagnosis not present

## 2022-11-15 DIAGNOSIS — R262 Difficulty in walking, not elsewhere classified: Secondary | ICD-10-CM | POA: Diagnosis not present

## 2022-11-15 DIAGNOSIS — M6281 Muscle weakness (generalized): Secondary | ICD-10-CM | POA: Diagnosis not present

## 2022-11-16 ENCOUNTER — Other Ambulatory Visit: Payer: Self-pay

## 2022-11-16 ENCOUNTER — Telehealth: Payer: Self-pay | Admitting: Family

## 2022-11-16 ENCOUNTER — Ambulatory Visit: Payer: Medicare Other | Admitting: Student in an Organized Health Care Education/Training Program

## 2022-11-16 DIAGNOSIS — E1140 Type 2 diabetes mellitus with diabetic neuropathy, unspecified: Secondary | ICD-10-CM

## 2022-11-16 MED ORDER — ONETOUCH DELICA LANCING DEV MISC: 1.0000 | Freq: Every day | 2 refills | Status: AC

## 2022-11-16 NOTE — Telephone Encounter (Signed)
Pt called stating she need a new script for a lancet device sent to [facility]

## 2022-11-16 NOTE — Telephone Encounter (Signed)
sent 

## 2022-11-17 DIAGNOSIS — E876 Hypokalemia: Secondary | ICD-10-CM | POA: Diagnosis not present

## 2022-11-17 DIAGNOSIS — I10 Essential (primary) hypertension: Secondary | ICD-10-CM | POA: Diagnosis not present

## 2022-11-18 ENCOUNTER — Other Ambulatory Visit: Payer: Self-pay | Admitting: Interventional Radiology

## 2022-11-18 DIAGNOSIS — N2889 Other specified disorders of kidney and ureter: Secondary | ICD-10-CM

## 2022-11-22 DIAGNOSIS — M1711 Unilateral primary osteoarthritis, right knee: Secondary | ICD-10-CM | POA: Diagnosis not present

## 2022-11-22 DIAGNOSIS — M6281 Muscle weakness (generalized): Secondary | ICD-10-CM | POA: Diagnosis not present

## 2022-11-22 DIAGNOSIS — R262 Difficulty in walking, not elsewhere classified: Secondary | ICD-10-CM | POA: Diagnosis not present

## 2022-11-24 ENCOUNTER — Ambulatory Visit: Payer: Medicare Other | Admitting: Podiatry

## 2022-11-24 ENCOUNTER — Encounter: Payer: Self-pay | Admitting: Podiatry

## 2022-11-24 DIAGNOSIS — M79674 Pain in right toe(s): Secondary | ICD-10-CM

## 2022-11-24 DIAGNOSIS — E1140 Type 2 diabetes mellitus with diabetic neuropathy, unspecified: Secondary | ICD-10-CM | POA: Diagnosis not present

## 2022-11-24 DIAGNOSIS — M79675 Pain in left toe(s): Secondary | ICD-10-CM | POA: Diagnosis not present

## 2022-11-24 DIAGNOSIS — B351 Tinea unguium: Secondary | ICD-10-CM

## 2022-11-24 NOTE — Progress Notes (Signed)

## 2022-11-27 ENCOUNTER — Other Ambulatory Visit: Payer: Self-pay | Admitting: Family

## 2022-11-29 DIAGNOSIS — M6281 Muscle weakness (generalized): Secondary | ICD-10-CM | POA: Diagnosis not present

## 2022-11-29 DIAGNOSIS — M1711 Unilateral primary osteoarthritis, right knee: Secondary | ICD-10-CM | POA: Diagnosis not present

## 2022-11-29 DIAGNOSIS — R262 Difficulty in walking, not elsewhere classified: Secondary | ICD-10-CM | POA: Diagnosis not present

## 2022-11-30 ENCOUNTER — Telehealth: Payer: Self-pay | Admitting: Family

## 2022-11-30 ENCOUNTER — Ambulatory Visit
Admission: RE | Admit: 2022-11-30 | Discharge: 2022-11-30 | Disposition: A | Discharge status: Home / Self Care | Payer: Medicare Other | Source: Ambulatory Visit | Attending: Interventional Radiology | Admitting: Interventional Radiology

## 2022-11-30 DIAGNOSIS — E039 Hypothyroidism, unspecified: Secondary | ICD-10-CM

## 2022-11-30 DIAGNOSIS — N2889 Other specified disorders of kidney and ureter: Secondary | ICD-10-CM

## 2022-11-30 DIAGNOSIS — Z5189 Encounter for other specified aftercare: Secondary | ICD-10-CM | POA: Diagnosis not present

## 2022-11-30 DIAGNOSIS — Z905 Acquired absence of kidney: Secondary | ICD-10-CM | POA: Diagnosis not present

## 2022-11-30 MED ORDER — LEVOTHYROXINE SODIUM 137 MCG PO TABS: ORAL_TABLET | ORAL | 1 refills | Status: DC

## 2022-11-30 MED ORDER — ONETOUCH DELICA PLUS LANCET30G MISC: 12 refills | Status: DC

## 2022-11-30 NOTE — Addendum Note (Signed)
Addended by: [place], Zayveon Raschke on: 11/30/2022 09:25 AM   Modules accepted: Orders

## 2022-11-30 NOTE — Telephone Encounter (Signed)
Prescription Request  11/30/2022  LOV: 10/21/2022  What is the name of the medication or equipment? levothyroxine (SYNTHROID) 137 MCG tablet and Lancets (ONETOUCH DELICA PLUS [id number]) [place]  Have you contacted your pharmacy to request a refill? No   Which pharmacy would you like this sent to?  [id number] [place] [id number]: [phone]: [phone]   Patient notified that their request is being sent to the clinical staff for review and that they should receive a response within 2 business days.   Please [facility] at [phone]

## 2022-11-30 NOTE — Progress Notes (Signed)
Patient ID: Cynthia [name], female   DOB: 1946/07/27, 77 y.o.   MRN: [id number]       Chief Complaint: Patient was consulted remotely today ([place]) for follow up renal cell carcinoma post ablation at the request of Arville Postlewaite.    Referring Physician(s): Delmer Islam, MD   History of Present Illness: Cynthia [name] is a 77 y.o. female  with a history of neuroendocrine colon carcinoma and pulmonary nodules. 02/05/2018 PET/CT showed no residual/recurrent disease related to her colon carcinoma.  Small low-attenuation left renal lesion is in retrospect noted. 02/19/2020 on a follow-up CT chest study, interval increase in size of left renal mass was identified 03/02/2020 renal ultrasound confirms solitary 2.7 cm left mid renal mass 03/09/2020 MR confirms 2.1 cm complex left upper pole renal lesion, partially exophytic.  No regional invasion or adenopathy. The patient was asymptomatic from  this lesion.  No renal insufficiency.  No hematuria, flank pain, weight loss.  No history of kidney cancer.  Positive history of tobacco abuse. 04/29/2020 patient underwent CT-guided core biopsy and cryoablation of the left renal mass.  Surgical  pathology consistent with clear-cell carcinoma.  She did well postprocedure and was discharged home the following day as scheduled.  Minimal discomfort at the treatment site x2 days.  No hematuria. 09/25/2020 MRI abdomen without and with contrast showed post ablation change with no residual or recurrent tumor.  No regional adenopathy.  Nodular density seen in the right lung base of the lung. 03/08/2021 MR demonstrates stable post ablation changes, no residual/recurrent disease or evidence of metastatic disease 11/23/2021 MR: Contraction of the ablation site in the medial LEFT kidney compared to MRI 03/08/2021. No significant enhancement or nodularity present to suggest recurrence.  No new lesions in the kidneys.  05/24/22 CT chest lung cancer screening noted Mild fullness  noted right intrarenal collecting system, similar to prior  09/05/22 US renal for followup noted Mild RIGHT-sided hydronephrosis.  10/01/22 scheduled surveillance MR showed Stable appearance of cryoablation defect in the posterior midpole the left kidney. No evidence of recurrent mass at this site. No other suspicious renal masses identified. Stable benign-appearing right renal sinus cyst (no followup imaging is recommended) . No evidence of hydronephrosis.   She remains asymptomatic.   No symptoms in the left flank in the region of treatment.  No hematuria or flank pain.  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 denies   Diabetes mellitus without complication ([place])    type 2   Family history of breast cancer    Family history of lymphoma    Family history of prostate cancer    GERD (gastroesophageal reflux disease)    Hyperlipidemia    Hypertension    Hypothyroidism    Lung nodule    Thyroid disease     Past Surgical History:  Procedure Laterality Date   BREAST BIOPSY Left 11/25/2015   stereo  neg   BREAST EXCISIONAL BIOPSY Left yrs ago   benign   COLONOSCOPY WITH PROPOFOL N/A 04/20/2017   Procedure: COLONOSCOPY WITH PROPOFOL;  Surgeon: [name], MD;  Location: [facility] ENDOSCOPY;  Service: Endoscopy;  Laterality: N/A;   COLONOSCOPY WITH PROPOFOL N/A 01/08/2018   Procedure: COLONOSCOPY WITH PROPOFOL;  Surgeon: [name], MD;  Location: [facility] ENDOSCOPY;  Service: Gastroenterology;  Laterality: N/A;  COLONOSCOPY WITH PROPOFOL N/A 08/10/2018   Procedure: COLONOSCOPY WITH PROPOFOL;  Surgeon: [name], MD;  Location: [facility] ENDOSCOPY;  Service: Gastroenterology;  Laterality: N/A;   COLONOSCOPY WITH PROPOFOL N/A 02/08/2021   Procedure: COLONOSCOPY WITH PROPOFOL;  Surgeon: [name], MD;  Location[facility] ENDOSCOPY;  Service:  Gastroenterology;  Laterality: N/A;   IR RADIOLOGIST EVAL & MGMT  04/08/2020   IR RADIOLOGIST EVAL & MGMT  05/26/2020   IR RADIOLOGIST EVAL & MGMT  09/29/2020   IR RADIOLOGIST EVAL & MGMT  03/10/2021   IR RADIOLOGIST EVAL & MGMT  12/10/2021   RADIOLOGY WITH ANESTHESIA N/A 04/29/2020   Procedure: CRYOABLATION;  Surgeon: Arne Cleveland, MD;  Location: [facility] ORS;  Service: Radiology;  Laterality: N/A;   THYROIDECTOMY     Dr. [name]   VAGINAL DELIVERY     4    Allergies: Patient has no known allergies.  Medications: Prior to Admission medications   Medication Sig Start Date End Date Taking? Authorizing Provider  amLODipine (NORVASC) 2.5 MG tablet Take 2 tablets (5 mg total) by mouth daily. 09/27/22   Burnard Hawthorne, FNP  aspirin EC 81 MG tablet Take 81 mg by mouth daily. Swallow whole.    [provider]  atorvastatin (LIPITOR) 20 MG tablet TAKE 1 TABLET BY MOUTH EVERY DAY 11/11/22   Burnard Hawthorne, FNP  blood glucose meter kit and supplies KIT Dispense based on patient and insurance preference. Use up to four times daily as directed. 05/17/21   Burnard Hawthorne, FNP  Calcium Carbonate-Vit D-Min (CALCIUM 600+D PLUS MINERALS) 600-400 MG-UNIT TABS Take 1 tablet by mouth daily.     [provider]  diclofenac sodium (VOLTAREN) 1 % GEL Apply 1 application  topically 4 (four) times daily as needed (knee pain.).    [provider]  DULoxetine (CYMBALTA) 60 MG capsule Take 1 capsule (60 mg total) by mouth daily. 10/21/22   Burnard Hawthorne, FNP  hydrochlorothiazide (HYDRODIURIL) 25 MG tablet Take 1 tablet (25 mg total) by mouth daily. 09/27/22   Burnard Hawthorne, FNP  Lancet Devices (ONE TOUCH DELICA LANCING DEV) MISC 1 Device by Does not apply route daily. 11/16/22   Burnard Hawthorne, FNP  Lancets [facility] DELICA PLUS [id number]) MISC USE AS INSTRUCTED 04/08/21   Burnard Hawthorne, FNP  levothyroxine (SYNTHROID) 137 MCG tablet TAKE 1 TABLET BY MOUTH EVERY DAY BEFORE  BREAKFAST 05/12/22   Dutch Quint B, FNP  losartan (COZAAR) 50 MG tablet Take 1 tablet (50 mg total) by mouth daily. 09/27/22   Burnard Hawthorne, FNP  meloxicam (MOBIC) 7.5 MG tablet Take 1 tablet (7.5 mg total) by mouth daily as needed for pain. 09/27/22   Burnard Hawthorne, FNP  metFORMIN (GLUCOPHAGE) 500 MG tablet TAKE 1 TABLET BY MOUTH TWICE A DAY 11/28/22   Burnard Hawthorne, FNP  metoprolol tartrate (LOPRESSOR) 25 MG tablet Take 1 tablet (25 mg total) by mouth 2 (two) times daily. 04/17/19   Burnard Hawthorne, FNP  ONETOUCH ULTRA test strip TEST BLOOD SUGAR 1-2 TIMES A DAY 07/22/22   Burnard Hawthorne, FNP  traMADol (ULTRAM) 50 MG tablet Take 1 tablet (50 mg total) by mouth every 12 (twelve) hours as needed for severe pain. 10/05/22   Burnard Hawthorne, FNP  umeclidinium-vilanterol (ANORO ELLIPTA) 62.5-25 MCG/ACT AEPB Inhale 1 puff into the lungs daily. 08/17/22 08/17/23  Armando Reichert, MD     Family History  Problem Relation Age of Onset   Hypertension Mother  Hypertension Father    Prostate cancer Father 74   Diabetes Sister    Thyroid disease Sister    Breast cancer Sister 19   Breast cancer Sister 45   Lymphoma Sister 64   Breast cancer Maternal Aunt    Breast cancer Maternal Aunt    Cancer Paternal Grandmother        unk type    Social History   Socioeconomic History   Marital status: Married    Spouse name: Not on file   Number of children: 4   Years of education: Not on file   Highest education level: Not on file  Occupational History   Not on file  Tobacco Use   Smoking status: Former    Packs/day: 0.75    Years: 48.00    Total pack years: 36.00    Types: Cigarettes    Quit date: 2015    Years since quitting: 9.2   Smokeless tobacco: Former    Types: Snuff, Database administrator Use   Vaping Use: Never used  Substance and Sexual Activity   Alcohol use: No   Drug use: No   Sexual activity: Not Currently  Other Topics Concern   Not on file  Social History  Narrative   Lives in [place] with husband. Has 4 children.      4 grandchildren.      Work - Retired from [employer]- walking the track, 4x per week      Diet- regular      Social Determinants of Health   Financial Resource Strain: Low Risk  (04/21/2022)   Overall Financial Resource Strain (CARDIA)    Difficulty of Paying Living Expenses: Not hard at all  Food Insecurity: No Food Insecurity (04/21/2022)   Hunger Vital Sign    Worried About Running Out of Food in the Last Year: Never true    [place] in the Last Year: Never true  Transportation Needs: No Transportation Needs (04/21/2022)   PRAPARE - Hydrologist (Medical): No    Lack of Transportation (Non-Medical): No  Physical Activity: Sufficiently Active (04/21/2022)   Exercise Vital Sign    Days of Exercise per Week: 5 days    Minutes of Exercise per Session: 30 min  Stress: No Stress Concern Present (04/21/2022)   [place]    Feeling of Stress : Not at all  Social Connections: [place] (04/21/2022)   Social Connection and Isolation Panel [NHANES]    Frequency of Communication with Friends and Family: More than three times a week    Frequency of Social Gatherings with Friends and Family: Once a week    Attends Religious Services: More than 4 times per year    Active Member of [employer] or Organizations: Yes    Attends Music therapist: More than 4 times per year    Marital Status: Married    ECOG Status: 0 - Asymptomatic  Review of Systems  Review of Systems: A 12 point ROS discussed and pertinent positives are indicated in the HPI above.  All other systems are negative.    Physical Exam No direct physical exam was performed (except for noted visual exam findings with Video Visits).     Vital Signs: There were no vitals taken for this visit.  Imaging: No results  found.  Labs:  CBC: Recent Labs    12/15/21 1040 02/01/22  1009 04/07/22 1026  WBC 4.6 3.7* 3.4*  HGB 13.6 13.5 13.6  HCT 40.3 40.0 41.3  PLT 274.0 261.0 280.0    COAGS: No results for input(s): "INR", "APTT" in the last 8760 hours.  BMP: Recent Labs    12/15/21 1040 05/03/22 0940  NA 138 135  K 4.1 3.4*  CL 101 101  CO2 26 25  GLUCOSE 115* 164*  BUN 11 14  CALCIUM 9.9 9.5  CREATININE 0.67 0.64  GFRNONAA  --  >60    LIVER FUNCTION TESTS: Recent Labs    12/15/21 1040 05/03/22 0940  BILITOT 0.5 0.8  AST 20 33  ALT 20 22  ALKPHOS 69 66  PROT 6.8 7.7  ALBUMIN 4.1 4.0    TUMOR MARKERS: No results for input(s): "AFPTM", "CEA", "CA199", "CHROMGRNA" in the last 8760 hours.  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  No residual/recurrent tumor or evidence of regional adenopathy  on follow-up MRI.  Creatinine normal, stable.  She is essentially asymptomatic from the lesion and from the procedure.  I am satisfied with her progress thus far.    We reviewed the importance of continued imaging surveillance for at least 5 years to  exclude any residual/recurrent neoplasm.  We discussed the anticipation of curative treatment.  She seemed to understand and did ask appropriate questions which were answered. Will set up MR with contrast in about 1 year and follow-up with her after I see those results. She knows to telephone with any questions or problems regarding the procedure in the interval if needed.   Thank you for this interesting consult.  I greatly enjoyed meeting PERL ESPER and look forward to participating in their care.  A copy of this report was sent to the requesting provider on this date.  Electronically Signed: Rickard Rhymes 11/30/2022, 9:06 AM   I spent a total of    15 Minutes in remote  clinical consultation, greater than 50% of which  was counseling/coordinating care for left renal cell carcinoma, post ablation.    Visit type: Audio only (telephone). Audio (no video) only due to patient's lack of internet/smartphone capability. Alternative for in-person consultation at [facility], [address] [place], [place], [place].  This format is felt to be most appropriate for this patient at this time.  All issues noted in this document were discussed and addressed.

## 2022-12-02 ENCOUNTER — Encounter: Payer: Self-pay | Admitting: Oncology

## 2022-12-02 ENCOUNTER — Inpatient Hospital Stay (HOSPITAL_BASED_OUTPATIENT_CLINIC_OR_DEPARTMENT_OTHER): Payer: Medicare Other | Admitting: Oncology

## 2022-12-02 ENCOUNTER — Inpatient Hospital Stay: Payer: Medicare Other | Attending: Oncology

## 2022-12-02 VITALS — BP 125/86 | HR 75 | Temp 95.0°F | Resp 18 | Wt 212.1 lb

## 2022-12-02 DIAGNOSIS — Z8249 Family history of ischemic heart disease and other diseases of the circulatory system: Secondary | ICD-10-CM | POA: Insufficient documentation

## 2022-12-02 DIAGNOSIS — Z87891 Personal history of nicotine dependence: Secondary | ICD-10-CM | POA: Diagnosis not present

## 2022-12-02 DIAGNOSIS — K760 Fatty (change of) liver, not elsewhere classified: Secondary | ICD-10-CM | POA: Diagnosis not present

## 2022-12-02 DIAGNOSIS — C7A8 Other malignant neuroendocrine tumors: Secondary | ICD-10-CM | POA: Insufficient documentation

## 2022-12-02 DIAGNOSIS — E785 Hyperlipidemia, unspecified: Secondary | ICD-10-CM | POA: Insufficient documentation

## 2022-12-02 DIAGNOSIS — Z8349 Family history of other endocrine, nutritional and metabolic diseases: Secondary | ICD-10-CM | POA: Diagnosis not present

## 2022-12-02 DIAGNOSIS — Z79899 Other long term (current) drug therapy: Secondary | ICD-10-CM | POA: Diagnosis not present

## 2022-12-02 DIAGNOSIS — C642 Malignant neoplasm of left kidney, except renal pelvis: Secondary | ICD-10-CM | POA: Diagnosis not present

## 2022-12-02 DIAGNOSIS — E039 Hypothyroidism, unspecified: Secondary | ICD-10-CM | POA: Diagnosis not present

## 2022-12-02 DIAGNOSIS — Z833 Family history of diabetes mellitus: Secondary | ICD-10-CM | POA: Insufficient documentation

## 2022-12-02 DIAGNOSIS — Z79890 Hormone replacement therapy (postmenopausal): Secondary | ICD-10-CM | POA: Insufficient documentation

## 2022-12-02 DIAGNOSIS — E119 Type 2 diabetes mellitus without complications: Secondary | ICD-10-CM | POA: Diagnosis not present

## 2022-12-02 DIAGNOSIS — Z803 Family history of malignant neoplasm of breast: Secondary | ICD-10-CM | POA: Insufficient documentation

## 2022-12-02 DIAGNOSIS — I10 Essential (primary) hypertension: Secondary | ICD-10-CM | POA: Diagnosis not present

## 2022-12-02 DIAGNOSIS — Z809 Family history of malignant neoplasm, unspecified: Secondary | ICD-10-CM | POA: Diagnosis not present

## 2022-12-02 LAB — CBC WITH DIFFERENTIAL/PLATELET
Abs Immature Granulocytes: 0 10*3/uL (ref 0.00–0.07)
Basophils Absolute: 0 10*3/uL (ref 0.0–0.1)
Basophils Relative: 1 %
Eosinophils Absolute: 0.3 10*3/uL (ref 0.0–0.5)
Eosinophils Relative: 7 %
HCT: 44.2 % (ref 36.0–46.0)
Hemoglobin: 15 g/dL (ref 12.0–15.0)
Immature Granulocytes: 0 %
Lymphocytes Relative: 33 %
Lymphs Abs: 1.4 10*3/uL (ref 0.7–4.0)
MCH: 31.5 pg (ref 26.0–34.0)
MCHC: 33.9 g/dL (ref 30.0–36.0)
MCV: 92.9 fL (ref 80.0–100.0)
Monocytes Absolute: 0.7 10*3/uL (ref 0.1–1.0)
Monocytes Relative: 15 %
Neutro Abs: 1.9 10*3/uL (ref 1.7–7.7)
Neutrophils Relative %: 44 %
Platelets: 289 10*3/uL (ref 150–400)
RBC: 4.76 MIL/uL (ref 3.87–5.11)
RDW: 13.7 % (ref 11.5–15.5)
WBC: 4.3 10*3/uL (ref 4.0–10.5)
nRBC: 0 % (ref 0.0–0.2)

## 2022-12-02 LAB — COMPREHENSIVE METABOLIC PANEL
ALT: 27 U/L (ref 0–44)
AST: 31 U/L (ref 15–41)
Albumin: 4 g/dL (ref 3.5–5.0)
Alkaline Phosphatase: 57 U/L (ref 38–126)
Anion gap: 9 (ref 5–15)
BUN: 15 mg/dL (ref 8–23)
CO2: 29 mmol/L (ref 22–32)
Calcium: 9.7 mg/dL (ref 8.9–10.3)
Chloride: 100 mmol/L (ref 98–111)
Creatinine, Ser: 0.75 mg/dL (ref 0.44–1.00)
GFR, Estimated: >60 mL/min (ref >60)
Glucose, Bld: 134 mg/dL — ABNORMAL HIGH (ref 70–99)
Potassium: 3.8 mmol/L (ref 3.5–5.1)
Sodium: 138 mmol/L (ref 135–145)
Total Bilirubin: 0.7 mg/dL (ref 0.3–1.2)
Total Protein: 7.3 g/dL (ref 6.5–8.1)

## 2022-12-02 LAB — LACTATE DEHYDROGENASE: LDH: 144 U/L (ref 98–192)

## 2022-12-02 NOTE — Progress Notes (Signed)
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  S/p cryoablation.   10/02/2022 Stable cryoablation defect in the left kidney. No evidence of recurrent renal neoplasm or metastatic disease No new lesions in the kidneys. No lymphadenopathy. Dr.Hassell recommend to repeat MRI in 1 year-Jan 2025  Former smoker Continue follow-up with lung cancer screening program.  She is up-to-date for lung cancer screening.   Orders Placed This Encounter  Procedures   CBC with Differential ([place] Only)    Standing Status:   Future    Standing Expiration Date:   12/02/2023   CMP ([place] only)    Standing Status:   Future    Standing Expiration Date:   12/02/2023   Lactate dehydrogenase    Standing Status:   Future    Standing Expiration Date:   12/02/2023   Chromogranin A    Standing Status:   Future    Standing Expiration Date:   12/02/2023   Follow-up in 1 year. All questions were answered. The patient knows to call the clinic with any problems, questions or concerns.  Earlie Server, MD, PhD [facility] Hematology Oncology 12/02/2022   HISTORY OF PRESENTING ILLNESS:  Cynthia [name] is a  77 y.o.  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 All three ascending colon polyps were tubular adenoma. 5 sigmoid colon polyps were hyperplastic polyps. One polyp turned out to be well  defferentiated neuroendocrine tumor, 2.28mm, grade 1.   The tumor was small and the tattoo of the site of tumor removal is not feasible per GI.  Case was discussed on tumor board and recommend surveillance.  02/09/2018 dotatate PET scan was negative Patient had colonoscopy surveillance   # S/p repeat colonoscopy on 08/10/2018. Polyps resected. Pathology negative malignancy.  # She recently had a CT chest lung cancer screening.  With incidental findings of low attenuation lesion of the left kidney.  03/02/2020 ultrasound kidney showed 2.7 cm solid-appearing lesion of the left kidney which is suspicious for renal cell carcinoma. 03/09/2020 MRI abdomen with and without contrast showed complex cystic lesion arising from the upper pole of the left kidney measuring 2.1 cm.  Compatible with cystic renal cell carcinoma.  She also has right kidney lesion. Mild hepatic steatosis  # 04/29/2020 s/p image guided left renal mass (T1 lesion) cryoablation by Dr.Hassell.  # 09/25/20 MRI abdomen with and without contrast showed post ablation changes in the left kidney without evidence of residual at the site of ablation.  Nodular area seen in the right lung base is more focal seen the absence of significant atelectasis measuring approximately 49mm .  03/08/2021 MRI abdomen with and without contrast showed post ablation changes in the left kidney without evidence of residual or recurrent disease at the site of ablation. 11/23/21  MR abdomen w wo contrast showed Contraction of the ablation site in the medial LEFT kidney compared to MRI 03/08/2021. No significant enhancement or nodularity present to suggest recurrence.  Family history of breast cancer.  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   Genetic testing showed no pathological mutations.  INTERVAL HISTORY Cynthia [name] is a 77 y.o. female who has above history reviewed by me today presents for follow up of neuroendocrine cancer of  colon, presumed kidney cancer. Patient reports feeling well.  She has no complaints. Denies any nausea vomiting, diarrhea, sob, chest pain, abdominal pain.  Patient has good appetite    Review of Systems  Constitutional:  Negative for chills, fever, malaise/fatigue and weight loss.  HENT:  Negative for sore throat.   Eyes:  Negative for redness.  Respiratory:  Negative for cough, shortness of breath and wheezing.   Cardiovascular:  Negative for chest pain, palpitations and leg swelling.  Gastrointestinal:  Negative for abdominal pain, blood in stool, nausea and vomiting.  Genitourinary:  Negative for dysuria.  Musculoskeletal:  Negative for joint pain and myalgias.  Skin:  Negative for rash.  Neurological:  Negative for dizziness, tingling and tremors.  Endo/Heme/Allergies:  Does not bruise/bleed easily.  Psychiatric/Behavioral:  Negative for hallucinations.     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 denies   Diabetes mellitus without complication ([place])    type 2   Family history of breast cancer    Family history of lymphoma    Family history of prostate cancer    GERD (gastroesophageal reflux disease)    Hyperlipidemia    Hypertension    Hypothyroidism    Lung nodule    Thyroid disease     SURGICAL HISTORY: Past Surgical History:  Procedure Laterality Date   BREAST BIOPSY Left 11/25/2015   stereo  neg   BREAST EXCISIONAL BIOPSY Left yrs ago   benign   COLONOSCOPY WITH PROPOFOL N/A 04/20/2017   Procedure: COLONOSCOPY WITH PROPOFOL;  Surgeon: [name], MD;  Location: [facility] ENDOSCOPY;  Service: Endoscopy;  Laterality: N/A;   COLONOSCOPY WITH PROPOFOL N/A 01/08/2018   Procedure: COLONOSCOPY WITH PROPOFOL;  Surgeon: [name], MD;  Location: [facility] ENDOSCOPY;  Service: Gastroenterology;   Laterality: N/A;   COLONOSCOPY WITH PROPOFOL N/A 08/10/2018   Procedure: COLONOSCOPY WITH PROPOFOL;  Surgeon: [name], MD;  Location: [facility] ENDOSCOPY;  Service: Gastroenterology;  Laterality: N/A;   COLONOSCOPY WITH PROPOFOL N/A 02/08/2021   Procedure: COLONOSCOPY WITH PROPOFOL;  Surgeon: [name], MD;  Location: [facility] ENDOSCOPY;  Service: Gastroenterology;  Laterality: N/A;   IR RADIOLOGIST EVAL & MGMT  04/08/2020   IR RADIOLOGIST EVAL & MGMT  05/26/2020   IR RADIOLOGIST EVAL & MGMT  09/29/2020   IR RADIOLOGIST EVAL & MGMT  03/10/2021   IR RADIOLOGIST EVAL & MGMT  12/10/2021   RADIOLOGY WITH ANESTHESIA N/A 04/29/2020   Procedure: CRYOABLATION;  Surgeon: Arne Cleveland, MD;  Location: [facility] ORS;  Service: Radiology;  Laterality: N/A;   THYROIDECTOMY     Dr. [name]   VAGINAL DELIVERY     4    SOCIAL HISTORY: Social History   Socioeconomic History   Marital status: Married    Spouse name: Not on file   Number of children: 4   Years of education: Not on file   Highest education level: Not on file  Occupational History   Not on file  Tobacco Use  Smoking status: Former    Packs/day: 0.75    Years: 48.00    Additional pack years: 0.00    Total pack years: 36.00    Types: Cigarettes    Quit date: 2015    Years since quitting: 9.2   Smokeless tobacco: Former    Types: Snuff, Chew  Vaping Use   Vaping Use: Never used  Substance and Sexual Activity   Alcohol use: No   Drug use: No   Sexual activity: Not Currently  Other Topics Concern   Not on file  Social History Narrative   Lives in [place] with husband. Has 4 children.      4 grandchildren.      Work - Retired from [employer]- walking the track, 4x per week      Diet- regular      Social Determinants of Health   Financial Resource Strain: Low Risk  (04/21/2022)   Overall Financial Resource Strain (CARDIA)    Difficulty of Paying Living Expenses: Not hard at all  Food Insecurity: No Food  Insecurity (04/21/2022)   Hunger Vital Sign    Worried About Running Out of Food in the Last Year: Never true    [place] in the Last Year: Never true  Transportation Needs: No Transportation Needs (04/21/2022)   PRAPARE - Hydrologist (Medical): No    Lack of Transportation (Non-Medical): No  Physical Activity: Sufficiently Active (04/21/2022)   Exercise Vital Sign    Days of Exercise per Week: 5 days    Minutes of Exercise per Session: 30 min  Stress: No Stress Concern Present (04/21/2022)   [place]    Feeling of Stress : Not at all  Social Connections: [place] (04/21/2022)   Social Connection and Isolation Panel [NHANES]    Frequency of Communication with Friends and Family: More than three times a week    Frequency of Social Gatherings with Friends and Family: Once a week    Attends Religious Services: More than 4 times per year    Active Member of [employer] or Organizations: Yes    Attends Archivist Meetings: More than 4 times per year    Marital Status: Married  Human resources officer Violence: Not At Risk (04/21/2022)   Humiliation, Afraid, Rape, and Kick questionnaire    Fear of Current or Ex-Partner: No    Emotionally Abused: No    Physically Abused: No    Sexually Abused: No    FAMILY HISTORY: Family History  Problem Relation Age of Onset   Hypertension Mother    Hypertension Father    Prostate cancer Father 54   Diabetes Sister    Thyroid disease Sister    Breast cancer Sister 31   Breast cancer Sister 43   Lymphoma Sister 72   Breast cancer Maternal Aunt    Breast cancer Maternal Aunt    Cancer Paternal Grandmother        unk type    ALLERGIES:  has No Known Allergies.  MEDICATIONS:  Current Outpatient Medications  Medication Sig Dispense Refill   amLODipine (NORVASC) 2.5 MG tablet Take 2 tablets (5 mg total) by mouth daily. 90 tablet 1   aspirin  EC 81 MG tablet Take 81 mg by mouth daily. Swallow whole.     atorvastatin (LIPITOR) 20 MG tablet TAKE 1 TABLET BY MOUTH EVERY DAY 90 tablet 0  blood glucose meter kit and supplies KIT Dispense based on patient and insurance preference. Use up to four times daily as directed. 1 each 0   Calcium Carbonate-Vit D-Min (CALCIUM 600+D PLUS MINERALS) 600-400 MG-UNIT TABS Take 1 tablet by mouth daily.      diclofenac sodium (VOLTAREN) 1 % GEL Apply 1 application  topically 4 (four) times daily as needed (knee pain.).     DULoxetine (CYMBALTA) 60 MG capsule Take 1 capsule (60 mg total) by mouth daily. 90 capsule 3   hydrochlorothiazide (HYDRODIURIL) 25 MG tablet Take 1 tablet (25 mg total) by mouth daily. 90 tablet 3   Lancet Devices (ONE TOUCH DELICA LANCING DEV) MISC 1 Device by Does not apply route daily. 1 each 2   Lancets (ONETOUCH DELICA PLUS Q000111Q) MISC USE AS INSTRUCTED 100 each 12   levothyroxine (SYNTHROID) 137 MCG tablet TAKE 1 TABLET BY MOUTH EVERY DAY BEFORE BREAKFAST 90 tablet 1   losartan (COZAAR) 50 MG tablet Take 1 tablet (50 mg total) by mouth daily. 90 tablet 1   meloxicam (MOBIC) 7.5 MG tablet Take 1 tablet (7.5 mg total) by mouth daily as needed for pain. 30 tablet 2   metFORMIN (GLUCOPHAGE) 500 MG tablet TAKE 1 TABLET BY MOUTH TWICE A DAY 180 tablet 1   metoprolol tartrate (LOPRESSOR) 25 MG tablet Take 1 tablet (25 mg total) by mouth 2 (two) times daily. 90 tablet 1   ONETOUCH ULTRA test strip TEST BLOOD SUGAR 1-2 TIMES A DAY 100 strip 5   potassium chloride SA (KLOR-CON M) 20 MEQ tablet Take by mouth.     traMADol (ULTRAM) 50 MG tablet Take 1 tablet (50 mg total) by mouth every 12 (twelve) hours as needed for severe pain. 60 tablet 2   umeclidinium-vilanterol (ANORO ELLIPTA) 62.5-25 MCG/ACT AEPB Inhale 1 puff into the lungs daily. 30 each 11   No current facility-administered medications for this visit.     PHYSICAL EXAMINATION: ECOG PERFORMANCE STATUS: 1 - Symptomatic but  completely ambulatory Vitals:   12/02/22 0945  BP: 125/86  Pulse: 75  Resp: 18  Temp: (!) 95 F (35 C)  SpO2: 100%   Filed Weights   12/02/22 0945  Weight: 212 lb 1.6 oz (96.2 kg)    Physical Exam Constitutional:      General: She is not in acute distress.    Appearance: She is well-developed. She is obese.  HENT:     Head: Normocephalic and atraumatic.  Eyes:     General: No scleral icterus. Cardiovascular:     Rate and Rhythm: Normal rate and regular rhythm.  Pulmonary:     Effort: Pulmonary effort is normal. No respiratory distress.     Breath sounds: Normal breath sounds. No wheezing.  Abdominal:     General: Bowel sounds are normal. There is no distension.     Palpations: Abdomen is soft.     Tenderness: There is no abdominal tenderness.  Musculoskeletal:        General: No deformity. Normal range of motion.     Cervical back: Normal range of motion and neck supple.  Skin:    General: Skin is warm and dry.     Findings: No erythema or rash.  Neurological:     Mental Status: She is alert and oriented to person, place, and time. Mental status is at baseline.     Cranial Nerves: No cranial nerve deficit.     Coordination: Coordination normal.  Psychiatric:  Mood and Affect: Mood normal.      LABORATORY DATA:  I have reviewed the data as listed    Latest Ref Rng & Units 12/02/2022    9:33 AM 04/07/2022   10:26 AM 02/01/2022   10:09 AM  CBC  WBC 4.0 - 10.5 K/uL 4.3  3.4  3.7   Hemoglobin 12.0 - 15.0 g/dL 15.0  13.6  13.5   Hematocrit 36.0 - 46.0 % 44.2  41.3  40.0   Platelets 150 - 400 K/uL 289  280.0  261.0       Latest Ref Rng & Units 12/02/2022    9:33 AM 05/03/2022    9:40 AM 12/15/2021   10:40 AM  CMP  Glucose 70 - 99 mg/dL 134  164  115   BUN 8 - 23 mg/dL 15  14  11    Creatinine 0.44 - 1.00 mg/dL 0.75  0.64  0.67   Sodium 135 - 145 mmol/L 138  135  138   Potassium 3.5 - 5.1 mmol/L 3.8  3.4  4.1   Chloride 98 - 111 mmol/L 100  101  101    CO2 22 - 32 mmol/L 29  25  26    Calcium 8.9 - 10.3 mg/dL 9.7  9.5  9.9   Total Protein 6.5 - 8.1 g/dL 7.3  7.7  6.8   Total Bilirubin 0.3 - 1.2 mg/dL 0.7  0.8  0.5   Alkaline Phos 38 - 126 U/L 57  66  69   AST 15 - 41 U/L 31  33  20   ALT 0 - 44 U/L 27  22  20

## 2022-12-02 NOTE — Assessment & Plan Note (Signed)
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  S/p cryoablation.   10/02/2022 Stable cryoablation defect in the left kidney. No evidence of recurrent renal neoplasm or metastatic disease No new lesions in the kidneys. No lymphadenopathy. Dr.Hassell recommend to repeat MRI in 1 year-Jan 2025

## 2022-12-02 NOTE — Assessment & Plan Note (Signed)
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

## 2022-12-02 NOTE — Assessment & Plan Note (Signed)
Continue follow-up with lung cancer screening program.  She is up-to-date for lung cancer screening.

## 2022-12-07 LAB — CHROMOGRANIN A: Chromogranin A (ng/mL): 61.6 ng/mL (ref 0.0–101.8)

## 2022-12-08 ENCOUNTER — Telehealth: Payer: Self-pay | Admitting: Student in an Organized Health Care Education/Training Program

## 2022-12-08 DIAGNOSIS — Z7982 Long term (current) use of aspirin: Secondary | ICD-10-CM | POA: Diagnosis not present

## 2022-12-08 DIAGNOSIS — M1711 Unilateral primary osteoarthritis, right knee: Secondary | ICD-10-CM | POA: Diagnosis not present

## 2022-12-08 DIAGNOSIS — J449 Chronic obstructive pulmonary disease, unspecified: Secondary | ICD-10-CM | POA: Diagnosis not present

## 2022-12-08 DIAGNOSIS — E039 Hypothyroidism, unspecified: Secondary | ICD-10-CM | POA: Diagnosis not present

## 2022-12-08 DIAGNOSIS — I10 Essential (primary) hypertension: Secondary | ICD-10-CM | POA: Diagnosis not present

## 2022-12-08 DIAGNOSIS — E119 Type 2 diabetes mellitus without complications: Secondary | ICD-10-CM | POA: Diagnosis not present

## 2022-12-08 DIAGNOSIS — Z7984 Long term (current) use of oral hypoglycemic drugs: Secondary | ICD-10-CM | POA: Diagnosis not present

## 2022-12-08 DIAGNOSIS — Z791 Long term (current) use of non-steroidal anti-inflammatories (NSAID): Secondary | ICD-10-CM | POA: Diagnosis not present

## 2022-12-08 NOTE — Telephone Encounter (Signed)
Cynthia [name], please schedule PFT when you have schedule available. She is scheduled to see Dr. Genia Harold 12/28/2022

## 2022-12-08 NOTE — Telephone Encounter (Signed)
PT calling to verify appt. I see we have on appt notes a PFT is needed. Do we need to set up a PFT for this PT? I did not see any notes but I had little time to do so. Pls call PT to advise if she needs a PFT or not. TY.

## 2022-12-13 DIAGNOSIS — J449 Chronic obstructive pulmonary disease, unspecified: Secondary | ICD-10-CM | POA: Diagnosis not present

## 2022-12-13 DIAGNOSIS — E119 Type 2 diabetes mellitus without complications: Secondary | ICD-10-CM | POA: Diagnosis not present

## 2022-12-13 DIAGNOSIS — M1711 Unilateral primary osteoarthritis, right knee: Secondary | ICD-10-CM | POA: Diagnosis not present

## 2022-12-13 DIAGNOSIS — Z791 Long term (current) use of non-steroidal anti-inflammatories (NSAID): Secondary | ICD-10-CM | POA: Diagnosis not present

## 2022-12-13 DIAGNOSIS — E039 Hypothyroidism, unspecified: Secondary | ICD-10-CM | POA: Diagnosis not present

## 2022-12-13 DIAGNOSIS — I10 Essential (primary) hypertension: Secondary | ICD-10-CM | POA: Diagnosis not present

## 2022-12-13 DIAGNOSIS — Z7982 Long term (current) use of aspirin: Secondary | ICD-10-CM | POA: Diagnosis not present

## 2022-12-13 DIAGNOSIS — Z7984 Long term (current) use of oral hypoglycemic drugs: Secondary | ICD-10-CM | POA: Diagnosis not present

## 2022-12-15 NOTE — Telephone Encounter (Signed)
We finally got PFT appts for April. I have spoke with Cynthia [name] and her PFT has been rescheduled on 12/27/22  @10 :00am at [facility] and she is aware

## 2022-12-15 NOTE — Telephone Encounter (Signed)
Cynthia [name], please advise since you now have April PFT dates. Thanks

## 2022-12-16 ENCOUNTER — Other Ambulatory Visit: Payer: Self-pay | Admitting: Family

## 2022-12-16 DIAGNOSIS — I10 Essential (primary) hypertension: Secondary | ICD-10-CM

## 2022-12-20 DIAGNOSIS — E119 Type 2 diabetes mellitus without complications: Secondary | ICD-10-CM | POA: Diagnosis not present

## 2022-12-20 DIAGNOSIS — Z7984 Long term (current) use of oral hypoglycemic drugs: Secondary | ICD-10-CM | POA: Diagnosis not present

## 2022-12-20 DIAGNOSIS — Z791 Long term (current) use of non-steroidal anti-inflammatories (NSAID): Secondary | ICD-10-CM | POA: Diagnosis not present

## 2022-12-20 DIAGNOSIS — Z7982 Long term (current) use of aspirin: Secondary | ICD-10-CM | POA: Diagnosis not present

## 2022-12-20 DIAGNOSIS — I10 Essential (primary) hypertension: Secondary | ICD-10-CM | POA: Diagnosis not present

## 2022-12-20 DIAGNOSIS — M1711 Unilateral primary osteoarthritis, right knee: Secondary | ICD-10-CM | POA: Diagnosis not present

## 2022-12-20 DIAGNOSIS — J449 Chronic obstructive pulmonary disease, unspecified: Secondary | ICD-10-CM | POA: Diagnosis not present

## 2022-12-20 DIAGNOSIS — E039 Hypothyroidism, unspecified: Secondary | ICD-10-CM | POA: Diagnosis not present

## 2022-12-22 DIAGNOSIS — I10 Essential (primary) hypertension: Secondary | ICD-10-CM | POA: Diagnosis not present

## 2022-12-22 DIAGNOSIS — E782 Mixed hyperlipidemia: Secondary | ICD-10-CM | POA: Diagnosis not present

## 2022-12-22 DIAGNOSIS — I700 Atherosclerosis of aorta: Secondary | ICD-10-CM | POA: Diagnosis not present

## 2022-12-22 DIAGNOSIS — I2510 Atherosclerotic heart disease of native coronary artery without angina pectoris: Secondary | ICD-10-CM | POA: Diagnosis not present

## 2022-12-22 DIAGNOSIS — Z87891 Personal history of nicotine dependence: Secondary | ICD-10-CM | POA: Diagnosis not present

## 2022-12-22 DIAGNOSIS — E1140 Type 2 diabetes mellitus with diabetic neuropathy, unspecified: Secondary | ICD-10-CM | POA: Diagnosis not present

## 2022-12-22 DIAGNOSIS — N182 Chronic kidney disease, stage 2 (mild): Secondary | ICD-10-CM | POA: Diagnosis not present

## 2022-12-22 DIAGNOSIS — J441 Chronic obstructive pulmonary disease with (acute) exacerbation: Secondary | ICD-10-CM | POA: Diagnosis not present

## 2022-12-26 ENCOUNTER — Ambulatory Visit: Payer: Medicare Other | Admitting: Student in an Organized Health Care Education/Training Program

## 2022-12-27 ENCOUNTER — Ambulatory Visit: Payer: Medicare Other | Attending: Student in an Organized Health Care Education/Training Program

## 2022-12-27 DIAGNOSIS — J432 Centrilobular emphysema: Secondary | ICD-10-CM

## 2022-12-27 DIAGNOSIS — Z87891 Personal history of nicotine dependence: Secondary | ICD-10-CM | POA: Insufficient documentation

## 2022-12-27 DIAGNOSIS — J439 Emphysema, unspecified: Secondary | ICD-10-CM | POA: Insufficient documentation

## 2022-12-27 LAB — PULMONARY FUNCTION TEST ARMC ONLY
DL/VA % pred: 92 %
DL/VA: 3.67 ml/min/mmHg/L
DLCO unc % pred: 80 %
DLCO unc: 17.48 ml/min/mmHg
FEF 25-75 Post: 2.87 L/sec
FEF 25-75 Pre: 1.59 L/sec
FEF2575-%Change-Post: 79 %
FEF2575-%Pred-Post: 159 %
FEF2575-%Pred-Pre: 88 %
FEV1-%Change-Post: 14 %
FEV1-%Pred-Post: 93 %
FEV1-%Pred-Pre: 81 %
FEV1-Post: 2.27 L
FEV1-Pre: 1.98 L
FEV1FVC-%Change-Post: 4 %
FEV1FVC-%Pred-Pre: 101 %
FEV6-%Change-Post: 10 %
FEV6-%Pred-Post: 93 %
FEV6-%Pred-Pre: 84 %
FEV6-Post: 2.88 L
FEV6-Pre: 2.61 L
FEV6FVC-%Pred-Post: 105 %
FEV6FVC-%Pred-Pre: 105 %
FVC-%Change-Post: 10 %
FVC-%Pred-Post: 88 %
FVC-%Pred-Pre: 80 %
FVC-Post: 2.88 L
FVC-Pre: 2.61 L
Post FEV1/FVC ratio: 79 %
Post FEV6/FVC ratio: 100 %
Pre FEV1/FVC ratio: 76 %
Pre FEV6/FVC Ratio: 100 %
RV % pred: 119 %
RV: 3.02 L
TLC % pred: 98 %
TLC: 5.58 L

## 2022-12-27 MED ORDER — ALBUTEROL SULFATE (2.5 MG/3ML) 0.083% IN NEBU
2.5000 mg | INHALATION_SOLUTION | Freq: Once | RESPIRATORY_TRACT | Status: AC
  Administered 2022-12-27: 2.5 mg via RESPIRATORY_TRACT

## 2022-12-28 ENCOUNTER — Ambulatory Visit: Payer: Medicare Other | Admitting: Student in an Organized Health Care Education/Training Program

## 2022-12-28 ENCOUNTER — Encounter: Payer: Self-pay | Admitting: Student in an Organized Health Care Education/Training Program

## 2022-12-28 VITALS — BP 118/70 | HR 91 | Temp 97.9°F | Ht 68.0 in | Wt 216.8 lb

## 2022-12-28 DIAGNOSIS — J432 Centrilobular emphysema: Secondary | ICD-10-CM

## 2022-12-28 DIAGNOSIS — Z87891 Personal history of nicotine dependence: Secondary | ICD-10-CM | POA: Diagnosis not present

## 2022-12-28 DIAGNOSIS — R0602 Shortness of breath: Secondary | ICD-10-CM | POA: Diagnosis not present

## 2022-12-28 MED ORDER — ALBUTEROL SULFATE HFA 108 (90 BASE) MCG/ACT IN AERS: 2.0000 | INHALATION_SPRAY | Freq: Four times a day (QID) | RESPIRATORY_TRACT | 2 refills | Status: DC | PRN

## 2022-12-28 NOTE — Progress Notes (Signed)
Synopsis: follow up for cough and shortness of breath  Assessment & Plan:   #Centrilobular emphysema #Subacute Cough #PRiSM #Former Smoker  The patient is presenting for the evaluation of a subacute cough with findings of emphysema on chest CT. She had her PFT's yesterday that appear to be within normal, with findings that could be consistent with PRiSM. She does have some response to bronchodilators, with improvement in FEV1. That said, FEV1 was normal pre and post bronchodilators.  I had started her on Anoro Ellipta that she is using and reports feeling improved with. Given symptomatic improvement with Albuterol yesterday, I will add this to her treatment plan.   - albuterol (VENTOLIN HFA) 108 (90 Base) MCG/ACT inhaler; Inhale 2 puffs into the lungs every 6 (six) hours as needed for wheezing or shortness of breath.  Dispense: 8 g; Refill: 2 - Continue Anoro Ellipta one puff once daily   Return in about 1 year (around 12/28/2023).  I spent 30 minutes caring for this patient today, including preparing to see the patient, obtaining a medical history , reviewing a separately obtained history, performing a medically appropriate examination and/or evaluation, counseling and educating the patient/family/caregiver, ordering medications, tests, or procedures, documenting clinical information in the electronic health record, and independently interpreting results (not separately reported/billed) and communicating results to the patient/family/caregiver  Raechel Chute, MD [facility] 12/28/2022 1:10 PM    End of visit medications:  Meds ordered this encounter  Medications   albuterol (VENTOLIN HFA) 108 (90 Base) MCG/ACT inhaler    Sig: Inhale 2 puffs into the lungs every 6 (six) hours as needed for wheezing or shortness of breath.    Dispense:  8 g    Refill:  2     Current Outpatient Medications:    albuterol (VENTOLIN HFA) 108 (90 Base) MCG/ACT inhaler, Inhale 2  puffs into the lungs every 6 (six) hours as needed for wheezing or shortness of breath., Disp: 8 g, Rfl: 2   amLODipine (NORVASC) 2.5 MG tablet, TAKE 2 TABLETS BY MOUTH DAILY, Disp: 90 tablet, Rfl: 1   aspirin EC 81 MG tablet, Take 81 mg by mouth daily. Swallow whole., Disp: , Rfl:    atorvastatin (LIPITOR) 20 MG tablet, TAKE 1 TABLET BY MOUTH EVERY DAY, Disp: 90 tablet, Rfl: 0   blood glucose meter kit and supplies KIT, Dispense based on patient and insurance preference. Use up to four times daily as directed., Disp: 1 each, Rfl: 0   Calcium Carbonate-Vit D-Min (CALCIUM 600+D PLUS MINERALS) 600-400 MG-UNIT TABS, Take 1 tablet by mouth daily. , Disp: , Rfl:    diclofenac sodium (VOLTAREN) 1 % GEL, Apply 1 application  topically 4 (four) times daily as needed (knee pain.)., Disp: , Rfl:    DULoxetine (CYMBALTA) 60 MG capsule, Take 1 capsule (60 mg total) by mouth daily., Disp: 90 capsule, Rfl: 3   hydrochlorothiazide (HYDRODIURIL) 25 MG tablet, Take 1 tablet (25 mg total) by mouth daily., Disp: 90 tablet, Rfl: 3   Lancet Devices (ONE TOUCH DELICA LANCING DEV) MISC, 1 Device by Does not apply route daily., Disp: 1 each, Rfl: 2   Lancets (ONETOUCH DELICA PLUS LANCET30G) MISC, USE AS INSTRUCTED, Disp: 100 each, Rfl: 12   levothyroxine (SYNTHROID) 137 MCG tablet, TAKE 1 TABLET BY MOUTH EVERY DAY BEFORE BREAKFAST, Disp: 90 tablet, Rfl: 1   losartan (COZAAR) 50 MG tablet, Take 1 tablet (50 mg total) by mouth daily., Disp: 90 tablet, Rfl: 1   meloxicam (MOBIC)  7.5 MG tablet, Take 1 tablet (7.5 mg total) by mouth daily as needed for pain., Disp: 30 tablet, Rfl: 2   metFORMIN (GLUCOPHAGE) 500 MG tablet, TAKE 1 TABLET BY MOUTH TWICE A DAY, Disp: 180 tablet, Rfl: 1   metoprolol tartrate (LOPRESSOR) 25 MG tablet, Take 1 tablet (25 mg total) by mouth 2 (two) times daily., Disp: 90 tablet, Rfl: 1   ONETOUCH ULTRA test strip, TEST BLOOD SUGAR 1-2 TIMES A DAY, Disp: 100 strip, Rfl: 5   potassium chloride SA  (KLOR-CON M) 20 MEQ tablet, Take by mouth., Disp: , Rfl:    traMADol (ULTRAM) 50 MG tablet, Take 1 tablet (50 mg total) by mouth every 12 (twelve) hours as needed for severe pain., Disp: 60 tablet, Rfl: 2   umeclidinium-vilanterol (ANORO ELLIPTA) 62.5-25 MCG/ACT AEPB, Inhale 1 puff into the lungs daily., Disp: 30 each, Rfl: 11   Subjective:   PATIENT ID: Cynthia [name] GENDER: female DOB: May 06, 1946, MRN: [id number]  Chief Complaint  Patient presents with   Follow-up    PFT- occ SOB with exertion.     HPI  Cynthia [name] is a pleasant 77 year old female presenting to clinic for follow up.   She reports that she had an upper respiratory tract infection recently at which point she started developing wheezing and cough.  Review of her record are notable for a visit to her primary care physician in September 2023 for a COPD exacerbation where she was given a course of antibiotics and started on Breztri.  She reports that she has developed a cough following her URTI for which she was referred to [place].    Patient was started on Anoro Ellipta during her last visit with [place] and feels that her symptoms have improved drastically. Shortness of breath and cough are both improved. She had her PFT's yesterday and feels that the albuterol administered during the test made her feel better.  She denies any hemoptysis, fevers, chills, night sweats, and weight loss.  She does report shortness of breath that is mostly exertional and is chronic.  She has not noticed that the shortness of breath has gotten any worse.   She is a former smoker, having smoked for around 30 years but tells me less than a pack per day.  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   Ancillary information including prior medications, full medical/surgical/family/social histories, and PFTs (when available) are listed below and have been reviewed.   Review of Systems  Constitutional:  Negative for  chills, fever and weight loss.  Respiratory:  Positive for cough and shortness of breath. Negative for sputum production and wheezing.   Cardiovascular:  Negative for chest pain, leg swelling and PND.     Objective:   Vitals:   12/28/22 1141  BP: 118/70  Pulse: 91  Temp: 97.9 F (36.6 C)  TempSrc: Temporal  SpO2: 97%  Weight: 216 lb 12.8 oz (98.3 kg)  Height: 5\' 8"  (1.727 m)   97% on RA BMI Readings from Last 3 Encounters:  12/28/22 32.96 kg/m  12/02/22 31.32 kg/m  10/21/22 32.13 kg/m   Wt Readings from Last 3 Encounters:  12/28/22 216 lb 12.8 oz (98.3 kg)  12/02/22 212 lb 1.6 oz (96.2 kg)  10/21/22 217 lb 9.6 oz (98.7 kg)    Physical Exam Constitutional:      Appearance: Normal appearance. She is not ill-appearing.  HENT:     Nose: Nose normal.  Mouth/Throat:     Mouth: Mucous membranes are moist.  Eyes:     Extraocular Movements: Extraocular movements intact.  Cardiovascular:     Rate and Rhythm: Normal rate and regular rhythm.     Pulses: Normal pulses.     Heart sounds: Normal heart sounds.  Pulmonary:     Effort: Pulmonary effort is normal.     Breath sounds: Normal breath sounds.  Abdominal:     Palpations: Abdomen is soft.  Musculoskeletal:        General: Normal range of motion.  Neurological:     General: No focal deficit present.     Mental Status: She is alert and oriented to person, place, and time. Mental status is at baseline.       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 denies   Diabetes mellitus without complication    type 2   Family history of breast cancer    Family history of lymphoma    Family history of prostate cancer    GERD (gastroesophageal reflux disease)    Hyperlipidemia    Hypertension    Hypothyroidism    Lung nodule    Thyroid  disease      Family History  Problem Relation Age of Onset   Hypertension Mother    Hypertension Father    Prostate cancer Father 23   Diabetes Sister    Thyroid disease Sister    Breast cancer Sister 66   Breast cancer Sister 32   Lymphoma Sister 12   Breast cancer Maternal Aunt    Breast cancer Maternal Aunt    Cancer Paternal Grandmother        unk type     Past Surgical History:  Procedure Laterality Date   BREAST BIOPSY Left 11/25/2015   stereo  neg   BREAST EXCISIONAL BIOPSY Left yrs ago   benign   COLONOSCOPY WITH PROPOFOL N/A 04/20/2017   Procedure: COLONOSCOPY WITH PROPOFOL;  Surgeon: Wyline Mood, MD;  Location: [facility] ENDOSCOPY;  Service: Endoscopy;  Laterality: N/A;   COLONOSCOPY WITH PROPOFOL N/A 01/08/2018   Procedure: COLONOSCOPY WITH PROPOFOL;  Surgeon: Wyline Mood, MD;  Location: [facility] ENDOSCOPY;  Service: Gastroenterology;  Laterality: N/A;   COLONOSCOPY WITH PROPOFOL N/A 08/10/2018   Procedure: COLONOSCOPY WITH PROPOFOL;  Surgeon: Wyline Mood, MD;  Location: [facility] ENDOSCOPY;  Service: Gastroenterology;  Laterality: N/A;   COLONOSCOPY WITH PROPOFOL N/A 02/08/2021   Procedure: COLONOSCOPY WITH PROPOFOL;  Surgeon: Wyline Mood, MD;  Location: [facility] ENDOSCOPY;  Service: Gastroenterology;  Laterality: N/A;   IR RADIOLOGIST EVAL & MGMT  04/08/2020   IR RADIOLOGIST EVAL & MGMT  05/26/2020   IR RADIOLOGIST EVAL & MGMT  09/29/2020   IR RADIOLOGIST EVAL & MGMT  03/10/2021   IR RADIOLOGIST EVAL & MGMT  12/10/2021   RADIOLOGY WITH ANESTHESIA N/A 04/29/2020   Procedure: CRYOABLATION;  Surgeon: Oley Balm, MD;  Location: [facility] ORS;  Service: Radiology;  Laterality: N/A;   THYROIDECTOMY     Dr. Anda Kraft   VAGINAL DELIVERY     4    Social History   Socioeconomic History   Marital status: Married    Spouse name: Not on file   Number of children: 4   Years of education: Not on file  Highest education level: Not on file  Occupational History   Not on file  Tobacco Use    Smoking status: Former    Packs/day: 0.75    Years: 48.00    Additional pack years: 0.00    Total pack years: 36.00    Types: Cigarettes    Quit date: 2015    Years since quitting: 9.2   Smokeless tobacco: Former    Types: Snuff, Chew  Vaping Use   Vaping Use: Never used  Substance and Sexual Activity   Alcohol use: No   Drug use: No   Sexual activity: Not Currently  Other Topics Concern   Not on file  Social History Narrative   [place]. Has 4 children.      4 grandchildren.      Work - Retired from [employer]      Excercise- walking the track, 4x per week      Diet- regular      Social Determinants of Health   Financial Resource Strain: Low Risk  (04/21/2022)   Overall Financial Resource Strain (CARDIA)    Difficulty of Paying Living Expenses: Not hard at all  Food Insecurity: No Food Insecurity (04/21/2022)   Hunger Vital Sign    Worried About Running Out of Food in the Last Year: Never true    Ran Out of Food in the Last Year: Never true  Transportation Needs: No Transportation Needs (04/21/2022)   PRAPARE - Administrator, Civil ServiceTransportation    Lack of Transportation (Medical): No    Lack of Transportation (Non-Medical): No  Physical Activity: Sufficiently Active (04/21/2022)   Exercise Vital Sign    Days of Exercise per Week: 5 days    Minutes of Exercise per Session: 30 min  Stress: No Stress Concern Present (04/21/2022)   [employer] - Occupational Stress Questionnaire    Feeling of Stress : Not at all  Social Connections: Socially Integrated (04/21/2022)   Social Connection and Isolation Panel [NHANES]    Frequency of Communication with Friends and Family: More than three times a week    Frequency of Social Gatherings with Friends and Family: Once a week    Attends Religious Services: More than 4 times per year    Active Member of Clubs or Organizations: Yes    Attends BankerClub or Organization Meetings: More than 4 times per year     Marital Status: Married  Catering managerntimate Partner Violence: Not At Risk (04/21/2022)   Humiliation, Afraid, Rape, and Kick questionnaire    Fear of Current or Ex-Partner: No    Emotionally Abused: No    Physically Abused: No    Sexually Abused: No     No Known Allergies   CBC    Component Value Date/Time   WBC 4.3 12/02/2022 0933   RBC 4.76 12/02/2022 0933   HGB 15.0 12/02/2022 0933   HCT 44.2 12/02/2022 0933   PLT 289 12/02/2022 0933   MCV 92.9 12/02/2022 0933   MCH 31.5 12/02/2022 0933   MCHC 33.9 12/02/2022 0933   RDW 13.7 12/02/2022 0933   LYMPHSABS 1.4 12/02/2022 0933   MONOABS 0.7 12/02/2022 0933   EOSABS 0.3 12/02/2022 0933   BASOSABS 0.0 12/02/2022 0933    Pulmonary Functions Testing Results:    Latest Ref Rng & Units 12/27/2022   10:22 AM  PFT Results  FVC-Pre L 2.61   FVC-Predicted Pre % 80   FVC-Post L 2.88   FVC-Predicted Post % 88  Pre FEV1/FVC % % 76   Post FEV1/FCV % % 79   FEV1-Pre L 1.98   FEV1-Predicted Pre % 81   FEV1-Post L 2.27   DLCO uncorrected ml/min/mmHg 17.48   DLCO UNC% % 80   DLVA Predicted % 92   TLC L 5.58   TLC % Predicted % 98   RV % Predicted % 119     Outpatient Medications Prior to Visit  Medication Sig Dispense Refill   amLODipine (NORVASC) 2.5 MG tablet TAKE 2 TABLETS BY MOUTH DAILY 90 tablet 1   aspirin EC 81 MG tablet Take 81 mg by mouth daily. Swallow whole.     atorvastatin (LIPITOR) 20 MG tablet TAKE 1 TABLET BY MOUTH EVERY DAY 90 tablet 0   blood glucose meter kit and supplies KIT Dispense based on patient and insurance preference. Use up to four times daily as directed. 1 each 0   Calcium Carbonate-Vit D-Min (CALCIUM 600+D PLUS MINERALS) 600-400 MG-UNIT TABS Take 1 tablet by mouth daily.      diclofenac sodium (VOLTAREN) 1 % GEL Apply 1 application  topically 4 (four) times daily as needed (knee pain.).     DULoxetine (CYMBALTA) 60 MG capsule Take 1 capsule (60 mg total) by mouth daily. 90 capsule 3   hydrochlorothiazide  (HYDRODIURIL) 25 MG tablet Take 1 tablet (25 mg total) by mouth daily. 90 tablet 3   Lancet Devices (ONE TOUCH DELICA LANCING DEV) MISC 1 Device by Does not apply route daily. 1 each 2   Lancets (ONETOUCH DELICA PLUS LANCET30G) MISC USE AS INSTRUCTED 100 each 12   levothyroxine (SYNTHROID) 137 MCG tablet TAKE 1 TABLET BY MOUTH EVERY DAY BEFORE BREAKFAST 90 tablet 1   losartan (COZAAR) 50 MG tablet Take 1 tablet (50 mg total) by mouth daily. 90 tablet 1   meloxicam (MOBIC) 7.5 MG tablet Take 1 tablet (7.5 mg total) by mouth daily as needed for pain. 30 tablet 2   metFORMIN (GLUCOPHAGE) 500 MG tablet TAKE 1 TABLET BY MOUTH TWICE A DAY 180 tablet 1   metoprolol tartrate (LOPRESSOR) 25 MG tablet Take 1 tablet (25 mg total) by mouth 2 (two) times daily. 90 tablet 1   ONETOUCH ULTRA test strip TEST BLOOD SUGAR 1-2 TIMES A DAY 100 strip 5   potassium chloride SA (KLOR-CON M) 20 MEQ tablet Take by mouth.     traMADol (ULTRAM) 50 MG tablet Take 1 tablet (50 mg total) by mouth every 12 (twelve) hours as needed for severe pain. 60 tablet 2   umeclidinium-vilanterol (ANORO ELLIPTA) 62.5-25 MCG/ACT AEPB Inhale 1 puff into the lungs daily. 30 each 11   No facility-administered medications prior to visit.

## 2022-12-29 DIAGNOSIS — M1711 Unilateral primary osteoarthritis, right knee: Secondary | ICD-10-CM | POA: Diagnosis not present

## 2022-12-29 DIAGNOSIS — I10 Essential (primary) hypertension: Secondary | ICD-10-CM | POA: Diagnosis not present

## 2022-12-29 DIAGNOSIS — Z7982 Long term (current) use of aspirin: Secondary | ICD-10-CM | POA: Diagnosis not present

## 2022-12-29 DIAGNOSIS — J449 Chronic obstructive pulmonary disease, unspecified: Secondary | ICD-10-CM | POA: Diagnosis not present

## 2022-12-29 DIAGNOSIS — Z791 Long term (current) use of non-steroidal anti-inflammatories (NSAID): Secondary | ICD-10-CM | POA: Diagnosis not present

## 2022-12-29 DIAGNOSIS — E039 Hypothyroidism, unspecified: Secondary | ICD-10-CM | POA: Diagnosis not present

## 2022-12-29 DIAGNOSIS — Z7984 Long term (current) use of oral hypoglycemic drugs: Secondary | ICD-10-CM | POA: Diagnosis not present

## 2022-12-29 DIAGNOSIS — E119 Type 2 diabetes mellitus without complications: Secondary | ICD-10-CM | POA: Diagnosis not present

## 2023-01-02 DIAGNOSIS — M1711 Unilateral primary osteoarthritis, right knee: Secondary | ICD-10-CM | POA: Diagnosis not present

## 2023-01-04 ENCOUNTER — Ambulatory Visit: Payer: Medicare Other | Admitting: Family

## 2023-01-20 ENCOUNTER — Encounter: Payer: Self-pay | Admitting: Family

## 2023-01-20 ENCOUNTER — Ambulatory Visit (INDEPENDENT_AMBULATORY_CARE_PROVIDER_SITE_OTHER): Payer: Medicare Other | Admitting: Family

## 2023-01-20 VITALS — BP 118/72 | HR 90 | Temp 97.6°F | Ht 68.0 in | Wt 213.0 lb

## 2023-01-20 DIAGNOSIS — Z7984 Long term (current) use of oral hypoglycemic drugs: Secondary | ICD-10-CM | POA: Diagnosis not present

## 2023-01-20 DIAGNOSIS — R7309 Other abnormal glucose: Secondary | ICD-10-CM

## 2023-01-20 DIAGNOSIS — E1140 Type 2 diabetes mellitus with diabetic neuropathy, unspecified: Secondary | ICD-10-CM

## 2023-01-20 DIAGNOSIS — J439 Emphysema, unspecified: Secondary | ICD-10-CM

## 2023-01-20 DIAGNOSIS — N939 Abnormal uterine and vaginal bleeding, unspecified: Secondary | ICD-10-CM | POA: Diagnosis not present

## 2023-01-20 LAB — MICROALBUMIN / CREATININE URINE RATIO
Creatinine,U: 148.2 mg/dL
Microalb Creat Ratio: 3.3 mg/g (ref 0.0–30.0)
Microalb, Ur: 4.9 mg/dL — ABNORMAL HIGH (ref 0.0–1.9)

## 2023-01-20 LAB — HEMOGLOBIN A1C: Hgb A1c MFr Bld: 6.9 % — ABNORMAL HIGH (ref 4.6–6.5)

## 2023-01-20 LAB — LIPID PANEL
Cholesterol: 139 mg/dL (ref 0–200)
HDL: 62.4 mg/dL (ref >39.00)
LDL Cholesterol: 64 mg/dL (ref 0–99)
NonHDL: 76.67
Total CHOL/HDL Ratio: 2
Triglycerides: 64 mg/dL (ref 0.0–149.0)
VLDL: 12.8 mg/dL (ref 0.0–40.0)

## 2023-01-20 NOTE — Progress Notes (Signed)
Assessment & Plan:  Type 2 diabetes mellitus with diabetic neuropathy, without long-term current use of insulin (HCC) Assessment & Plan: Pending A1c.  Continue metformin 500 mg twice daily  Orders: -     POCT glycosylated hemoglobin (Hb A1C) -     Microalbumin / creatinine urine ratio -     Lipid panel -     Hemoglobin A1c  Elevated glucose -     POCT glycosylated hemoglobin (Hb A1C) -     Microalbumin / creatinine urine ratio  Pulmonary emphysema, unspecified emphysema type (HCC)  Vaginal bleeding Assessment & Plan: Able to identify small laceration proximal to urethra.  Anticipate that this will heal on its own . advised patient if she continues to blood after a few days, to let me know so we can reevaluate.      Return precautions given.   Risks, benefits, and alternatives of the medications and treatment plan prescribed today were discussed, and patient expressed understanding.   Education regarding symptom management and diagnosis given to patient on AVS either electronically or printed.  No follow-ups on file.  Rennie Plowman, FNP  Subjective:    Patient ID: Cynthia [name], female    DOB: 22-Oct-1945, 77 y.o.   MRN: [id number]  CC: Cynthia [name] is a 77 y.o. female who presents today for follow up.   HPI: She saw BRB on bathcloth this morning . It was more than a 'streak.'  No dysuria, changes to vaginal discharge, vaginal itching, abdominal pain, rectal bleeding , constipation   No h/o hemorrhoid.    Right ankle mass has resolved.   Right knee pain resolved with PT and corticosteriod injection.        Follow-up Dr. Juliann Pares, cardiology 12/22/2022 for CAD without angina, aortic atherosclerosis, hypertension, hyperlipidemia.  No medication changes made Allergies: Patient has no known allergies. Current Outpatient Medications on File Prior to Visit  Medication Sig Dispense Refill   albuterol (VENTOLIN HFA) 108 (90 Base) MCG/ACT inhaler Inhale 2  puffs into the lungs every 6 (six) hours as needed for wheezing or shortness of breath. 8 g 2   amLODipine (NORVASC) 2.5 MG tablet TAKE 2 TABLETS BY MOUTH DAILY 90 tablet 1   aspirin EC 81 MG tablet Take 81 mg by mouth daily. Swallow whole.     atorvastatin (LIPITOR) 20 MG tablet TAKE 1 TABLET BY MOUTH EVERY DAY 90 tablet 0   blood glucose meter kit and supplies KIT Dispense based on patient and insurance preference. Use up to four times daily as directed. 1 each 0   Calcium Carbonate-Vit D-Min (CALCIUM 600+D PLUS MINERALS) 600-400 MG-UNIT TABS Take 1 tablet by mouth daily.      diclofenac sodium (VOLTAREN) 1 % GEL Apply 1 application  topically 4 (four) times daily as needed (knee pain.).     DULoxetine (CYMBALTA) 60 MG capsule Take 1 capsule (60 mg total) by mouth daily. 90 capsule 3   hydrochlorothiazide (HYDRODIURIL) 25 MG tablet Take 1 tablet (25 mg total) by mouth daily. 90 tablet 3   Lancet Devices (ONE TOUCH DELICA LANCING DEV) MISC 1 Device by Does not apply route daily. 1 each 2   Lancets (ONETOUCH DELICA PLUS LANCET30G) MISC USE AS INSTRUCTED 100 each 12   levothyroxine (SYNTHROID) 137 MCG tablet TAKE 1 TABLET BY MOUTH EVERY DAY BEFORE BREAKFAST 90 tablet 1   losartan (COZAAR) 50 MG tablet Take 1 tablet (50 mg total) by mouth daily. 90 tablet 1   meloxicam (MOBIC)  7.5 MG tablet Take 1 tablet (7.5 mg total) by mouth daily as needed for pain. 30 tablet 2   metFORMIN (GLUCOPHAGE) 500 MG tablet TAKE 1 TABLET BY MOUTH TWICE A DAY 180 tablet 1   metoprolol tartrate (LOPRESSOR) 25 MG tablet Take 1 tablet (25 mg total) by mouth 2 (two) times daily. 90 tablet 1   ONETOUCH ULTRA test strip TEST BLOOD SUGAR 1-2 TIMES A DAY 100 strip 5   potassium chloride SA (KLOR-CON M) 20 MEQ tablet Take by mouth.     traMADol (ULTRAM) 50 MG tablet Take 1 tablet (50 mg total) by mouth every 12 (twelve) hours as needed for severe pain. 60 tablet 2   umeclidinium-vilanterol (ANORO ELLIPTA) 62.5-25 MCG/ACT AEPB  Inhale 1 puff into the lungs daily. 30 each 11   No current facility-administered medications on file prior to visit.    Review of Systems  Constitutional:  Negative for chills and fever.  Respiratory:  Negative for cough.   Cardiovascular:  Negative for chest pain and palpitations.  Gastrointestinal:  Negative for abdominal pain, nausea and vomiting.  Genitourinary:  Positive for vaginal bleeding. Negative for difficulty urinating and dysuria.      Objective:    BP 118/72   Pulse 90   Temp 97.6 F (36.4 C) (Oral)   Ht 5\' 8"  (1.727 m)   Wt 213 lb (96.6 kg)   SpO2 98%   BMI 32.39 kg/m  BP Readings from Last 3 Encounters:  01/20/23 118/72  12/28/22 118/70  12/02/22 125/86   Wt Readings from Last 3 Encounters:  01/20/23 213 lb (96.6 kg)  12/28/22 216 lb 12.8 oz (98.3 kg)  12/02/22 212 lb 1.6 oz (96.2 kg)    Physical Exam Vitals reviewed.  Constitutional:      Appearance: She is well-developed.  Eyes:     Conjunctiva/sclera: Conjunctivae normal.  Cardiovascular:     Rate and Rhythm: Normal rate and regular rhythm.     Pulses: Normal pulses.     Heart sounds: Normal heart sounds.  Pulmonary:     Effort: Pulmonary effort is normal.     Breath sounds: Normal breath sounds. No wheezing, rhonchi or rales.  Genitourinary:    Labia:        Right: No rash, tenderness or lesion.        Left: No rash, tenderness or lesion.      Vagina: No foreign body. No vaginal discharge, erythema, tenderness or bleeding.     Cervix: No cervical motion tenderness or discharge.     Adnexa:        Right: No mass, tenderness or fullness.         Left: No mass, tenderness or fullness.       Rectum: No external hemorrhoid.       Comments: Small laceration left side urethra.  No blood coming from cervical os.  No vulvovaginal erythema. No lesions. Discharge is thin and clear, not purulent.  No blood coming from rectum. Skin:    General: Skin is warm and dry.  Neurological:     Mental  Status: She is alert.  Psychiatric:        Speech: Speech normal.        Behavior: Behavior normal.        Thought Content: Thought content normal.

## 2023-01-20 NOTE — Assessment & Plan Note (Signed)
Pending A1c.  Continue metformin 500 mg twice daily °

## 2023-01-20 NOTE — Assessment & Plan Note (Addendum)
Able to identify small laceration proximal to urethra.  Anticipate that this will heal on its own . advised patient if she continues to blood after a few days, to let me know so we can reevaluate.

## 2023-01-23 ENCOUNTER — Telehealth: Payer: Self-pay

## 2023-01-23 NOTE — Telephone Encounter (Signed)
LVM to go over results

## 2023-02-23 ENCOUNTER — Encounter: Payer: Self-pay | Admitting: Podiatry

## 2023-02-23 ENCOUNTER — Ambulatory Visit: Payer: Medicare Other | Admitting: Podiatry

## 2023-02-23 VITALS — BP 111/69

## 2023-02-23 DIAGNOSIS — E1140 Type 2 diabetes mellitus with diabetic neuropathy, unspecified: Secondary | ICD-10-CM

## 2023-02-23 DIAGNOSIS — M79674 Pain in right toe(s): Secondary | ICD-10-CM | POA: Diagnosis not present

## 2023-02-23 DIAGNOSIS — M79675 Pain in left toe(s): Secondary | ICD-10-CM

## 2023-02-23 DIAGNOSIS — B351 Tinea unguium: Secondary | ICD-10-CM | POA: Diagnosis not present

## 2023-02-23 NOTE — Progress Notes (Signed)
  Subjective:  Patient ID: Cynthia [name], female    DOB: 01/17/1946,  MRN: [id number]  Cynthia [name] presents to clinic today for at risk foot care with history of diabetic neuropathy and painful elongated mycotic toenails 1-5 bilaterally which are tender when wearing enclosed shoe gear. Pain is relieved with periodic professional debridement.  Chief Complaint  Patient presents with   Nail Problem    DFC,A1C:6.9,Referring Provider Allegra Grana, FNP,LOV:05/24      New problem(s): None.   PCP is Arnett, Lyn Records, FNP.  No Known Allergies  Review of Systems: Negative except as noted in the HPI.  Objective: No changes noted in today's physical examination. Vitals:   02/23/23 1343  BP: 111/69   Cynthia [name] is a pleasant 77 y.o. female WD, WN in NAD. AAO x 3.  Vascular Examination: Capillary refill time immediate b/l. Vascular status intact b/l with palpable pedal pulses. Pedal hair sparse b/l. No pain with calf compression b/l. Skin temperature gradient WNL b/l. No cyanosis or clubbing b/l. No ischemia or gangrene noted b/l.   Neurological Examination: Sensation grossly intact b/l with 10 gram monofilament. Vibratory sensation intact b/l.   Dermatological Examination: Pedal skin with normal turgor, texture and tone b/l.  No open wounds. No interdigital macerations.   Toenails 1-5 b/l thick, discolored, elongated with subungual debris and pain on dorsal palpation.   No hyperkeratotic nor porokeratotic lesions present on today's visit.  Musculoskeletal Examination: Normal muscle strength 5/5 to all lower extremity muscle groups bilaterally. No pain, crepitus or joint limitation noted with ROM b/l LE. No gross bony pedal deformities b/l. Patient ambulates independently without assistive aids.  Radiographs: None  Last A1c:      Latest Ref Rng & Units 01/20/2023   10:12 AM 06/06/2022   10:00 AM  Hemoglobin A1C  Hemoglobin-A1c 4.6 - 6.5 % 6.9  6.4     Assessment/Plan: 1. Pain due to onychomycosis of toenails of both feet   2. Type 2 diabetes mellitus with diabetic neuropathy, without long-term current use of insulin (HCC)     -Patient was evaluated and treated. All patient's and/or POA's questions/concerns answered on today's visit. -Continue foot and shoe inspections daily. Monitor blood glucose per PCP/Endocrinologist's recommendations. -Patient to continue soft, supportive shoe gear daily. -Toenails 1-5 b/l were debrided in length and girth with sterile nail nippers and dremel without iatrogenic bleeding.  -Patient/POA to call should there be question/concern in the interim.   Return in about 3 months (around 05/26/2023).  Freddie Breech, DPM

## 2023-03-17 ENCOUNTER — Other Ambulatory Visit: Payer: Self-pay | Admitting: Family

## 2023-03-17 DIAGNOSIS — I10 Essential (primary) hypertension: Secondary | ICD-10-CM

## 2023-03-19 ENCOUNTER — Other Ambulatory Visit: Payer: Self-pay | Admitting: Acute Care

## 2023-03-19 DIAGNOSIS — Z122 Encounter for screening for malignant neoplasm of respiratory organs: Secondary | ICD-10-CM

## 2023-03-19 DIAGNOSIS — Z87891 Personal history of nicotine dependence: Secondary | ICD-10-CM

## 2023-03-21 ENCOUNTER — Other Ambulatory Visit: Payer: Self-pay | Admitting: Family

## 2023-03-21 DIAGNOSIS — I10 Essential (primary) hypertension: Secondary | ICD-10-CM

## 2023-03-22 ENCOUNTER — Telehealth: Payer: Self-pay | Admitting: Family

## 2023-03-22 ENCOUNTER — Other Ambulatory Visit: Payer: Self-pay | Admitting: Family

## 2023-03-22 DIAGNOSIS — G8929 Other chronic pain: Secondary | ICD-10-CM

## 2023-03-22 DIAGNOSIS — M1990 Unspecified osteoarthritis, unspecified site: Secondary | ICD-10-CM

## 2023-03-22 DIAGNOSIS — Z1231 Encounter for screening mammogram for malignant neoplasm of breast: Secondary | ICD-10-CM

## 2023-03-22 MED ORDER — MELOXICAM 7.5 MG PO TABS: 7.5000 mg | ORAL_TABLET | Freq: Every day | ORAL | 2 refills | Status: DC | PRN

## 2023-03-22 NOTE — Telephone Encounter (Signed)
Prescription Request  03/22/2023  LOV: 01/20/2023  What is the name of the medication or equipment? meloxicam (MOBIC) 7.5 MG tablet  Have you contacted your pharmacy to request a refill? Yes   Which pharmacy would you like this sent to?   [facility] [address] [place] - [address] [place] [address] Phone: [phone] Fax: [phone]    Patient notified that their request is being sent to the clinical staff for review and that they should receive a response within 2 business days.   Please advise at Mobile [phone] (mobile)

## 2023-04-07 ENCOUNTER — Other Ambulatory Visit: Payer: Self-pay | Admitting: Family

## 2023-04-07 DIAGNOSIS — E785 Hyperlipidemia, unspecified: Secondary | ICD-10-CM

## 2023-05-02 ENCOUNTER — Ambulatory Visit
Admission: RE | Admit: 2023-05-02 | Discharge: 2023-05-02 | Disposition: A | Discharge status: Home / Self Care | Payer: Medicare Other | Source: Ambulatory Visit | Attending: Family | Admitting: Family

## 2023-05-02 DIAGNOSIS — Z1231 Encounter for screening mammogram for malignant neoplasm of breast: Secondary | ICD-10-CM | POA: Diagnosis not present

## 2023-05-03 ENCOUNTER — Ambulatory Visit (INDEPENDENT_AMBULATORY_CARE_PROVIDER_SITE_OTHER): Payer: Medicare Other | Admitting: *Deleted

## 2023-05-03 VITALS — Ht 68.0 in | Wt 212.0 lb

## 2023-05-03 DIAGNOSIS — Z0000 Encounter for general adult medical examination without abnormal findings: Secondary | ICD-10-CM

## 2023-05-03 NOTE — Progress Notes (Signed)
Subjective:   Cynthia [name] is a 77 y.o. female who presents for Medicare Annual (Subsequent) preventive examination.  Visit Complete: Virtual  I connected with  Angela Adam on 05/03/23 by a audio enabled telemedicine application and verified that I am speaking with the correct person using two identifiers.  Patient Location: Home  Provider Location: Office/Clinic  I discussed the limitations of evaluation and management by telemedicine. The patient expressed understanding and agreed to proceed.  Vital Signs: Unable to obtain new vitals due to this being a telehealth visit.  Review of Systems     Cardiac Risk Factors include: advanced age (>41men, >70 women);diabetes mellitus;dyslipidemia;obesity (BMI >30kg/m2);hypertension     Objective:    Today's Vitals   05/03/23 1017  Weight: 212 lb (96.2 kg)  Height: 5\' 8"  (1.727 m)   Body mass index is 32.23 kg/m.     05/03/2023   10:31 AM 12/02/2022    9:51 AM 05/03/2022    9:57 AM 04/21/2022    9:29 AM 04/27/2021   10:15 AM 04/20/2021   11:47 AM 02/08/2021    8:53 AM  Advanced Directives  Does Patient Have a Medical Advance Directive? No No No No No No No  Type of Furniture conservator/restorer;Living will       Would patient like information on creating a medical advance directive? No - Patient declined No - Patient declined  No - Patient declined  No - Patient declined     Current Medications (verified) Outpatient Encounter Medications as of 05/03/2023  Medication Sig   albuterol (VENTOLIN HFA) 108 (90 Base) MCG/ACT inhaler Inhale 2 puffs into the lungs every 6 (six) hours as needed for wheezing or shortness of breath.   amLODipine (NORVASC) 2.5 MG tablet TAKE 2 TABLETS BY MOUTH EVERY DAY   aspirin EC 81 MG tablet Take 81 mg by mouth daily. Swallow whole.   atorvastatin (LIPITOR) 20 MG tablet TAKE 1 TABLET BY MOUTH EVERY DAY   blood glucose meter kit and supplies KIT Dispense based on patient and  insurance preference. Use up to four times daily as directed.   Calcium Carbonate-Vit D-Min (CALCIUM 600+D PLUS MINERALS) 600-400 MG-UNIT TABS Take 1 tablet by mouth daily.    diclofenac sodium (VOLTAREN) 1 % GEL Apply 1 application  topically 4 (four) times daily as needed (knee pain.).   DULoxetine (CYMBALTA) 60 MG capsule Take 1 capsule (60 mg total) by mouth daily.   hydrochlorothiazide (HYDRODIURIL) 25 MG tablet Take 1 tablet (25 mg total) by mouth daily.   Lancet Devices (ONE TOUCH DELICA LANCING DEV) MISC 1 Device by Does not apply route daily.   Lancets (ONETOUCH DELICA PLUS LANCET30G) MISC USE AS INSTRUCTED   levothyroxine (SYNTHROID) 137 MCG tablet TAKE 1 TABLET BY MOUTH EVERY DAY BEFORE BREAKFAST   losartan (COZAAR) 50 MG tablet TAKE 1 TABLET BY MOUTH EVERY DAY   meloxicam (MOBIC) 7.5 MG tablet Take 1 tablet (7.5 mg total) by mouth daily as needed for pain.   metFORMIN (GLUCOPHAGE) 500 MG tablet TAKE 1 TABLET BY MOUTH TWICE A DAY   metoprolol tartrate (LOPRESSOR) 25 MG tablet Take 1 tablet (25 mg total) by mouth 2 (two) times daily.   ONETOUCH ULTRA test strip TEST BLOOD SUGAR 1-2 TIMES A DAY   potassium chloride SA (KLOR-CON M) 20 MEQ tablet Take by mouth.   traMADol (ULTRAM) 50 MG tablet Take 1 tablet (50 mg total) by mouth every 12 (twelve) hours as needed  for severe pain.   umeclidinium-vilanterol (ANORO ELLIPTA) 62.5-25 MCG/ACT AEPB Inhale 1 puff into the lungs daily.   No facility-administered encounter medications on file as of 05/03/2023.    Allergies (verified) Patient has no known allergies.   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 denies   Diabetes mellitus without complication (HCC)    type 2   Family history of breast cancer    Family history of lymphoma    Family history of prostate cancer     GERD (gastroesophageal reflux disease)    Hyperlipidemia    Hypertension    Hypothyroidism    Lung nodule    Thyroid disease    Past Surgical History:  Procedure Laterality Date   BREAST BIOPSY Left 11/25/2015   stereo  neg   BREAST EXCISIONAL BIOPSY Left yrs ago   benign   COLONOSCOPY WITH PROPOFOL N/A 04/20/2017   Procedure: COLONOSCOPY WITH PROPOFOL;  Surgeon: Wyline Mood, MD;  Location: [facility] ENDOSCOPY;  Service: Endoscopy;  Laterality: N/A;   COLONOSCOPY WITH PROPOFOL N/A 01/08/2018   Procedure: COLONOSCOPY WITH PROPOFOL;  Surgeon: Wyline Mood, MD;  Location: [facility] ENDOSCOPY;  Service: Gastroenterology;  Laterality: N/A;   COLONOSCOPY WITH PROPOFOL N/A 08/10/2018   Procedure: COLONOSCOPY WITH PROPOFOL;  Surgeon: Wyline Mood, MD;  Location: [facility] ENDOSCOPY;  Service: Gastroenterology;  Laterality: N/A;   COLONOSCOPY WITH PROPOFOL N/A 02/08/2021   Procedure: COLONOSCOPY WITH PROPOFOL;  Surgeon: Wyline Mood, MD;  Location: [facility] ENDOSCOPY;  Service: Gastroenterology;  Laterality: N/A;   IR RADIOLOGIST EVAL & MGMT  04/08/2020   IR RADIOLOGIST EVAL & MGMT  05/26/2020   IR RADIOLOGIST EVAL & MGMT  09/29/2020   IR RADIOLOGIST EVAL & MGMT  03/10/2021   IR RADIOLOGIST EVAL & MGMT  12/10/2021   RADIOLOGY WITH ANESTHESIA N/A 04/29/2020   Procedure: CRYOABLATION;  Surgeon: Oley Balm, MD;  Location: [facility] ORS;  Service: Radiology;  Laterality: N/A;   THYROIDECTOMY     Dr. Anda Kraft   VAGINAL DELIVERY     4   Family History  Problem Relation Age of Onset   Hypertension Mother    Hypertension Father    Prostate cancer Father 65   Diabetes Sister    Thyroid disease Sister    Breast cancer Sister 62   Breast cancer Sister 67   Lymphoma Sister 7   Breast cancer Maternal Aunt    Breast cancer Maternal Aunt    Cancer Paternal Grandmother        unk type   Social History   Socioeconomic History   Marital status: Married    Spouse name: Not on file   Number of children: 4   Years of  education: Not on file   Highest education level: Not on file  Occupational History   Not on file  Tobacco Use   Smoking status: Former    Current packs/day: 0.00    Average packs/day: 0.8 packs/day for 48.0 years (36.0 ttl pk-yrs)    Types: Cigarettes    Start date: 37    Quit date: 2015    Years since quitting: 9.6   Smokeless tobacco: Former    Types: Snuff, Chew  Vaping Use   Vaping status: Never Used  Substance and Sexual Activity   Alcohol use: No  Drug use: No   Sexual activity: Not Currently  Other Topics Concern   Not on file  Social History Narrative   Widow Has 4 children.      4 grandchildren.      Work - Retired from [employer]- walking the track, 4x per week      Diet- regular      Social Determinants of Health   Financial Resource Strain: Low Risk  (05/03/2023)   Overall Financial Resource Strain (CARDIA)    Difficulty of Paying Living Expenses: Not hard at all  Food Insecurity: No Food Insecurity (05/03/2023)   Hunger Vital Sign    Worried About Running Out of Food in the Last Year: Never true    Ran Out of Food in the Last Year: Never true  Transportation Needs: No Transportation Needs (05/03/2023)   PRAPARE - Administrator, Civil Service (Medical): No    Lack of Transportation (Non-Medical): No  Physical Activity: Insufficiently Active (05/03/2023)   Exercise Vital Sign    Days of Exercise per Week: 3 days    Minutes of Exercise per Session: 30 min  Stress: No Stress Concern Present (05/03/2023)   [employer] of Occupational Health - Occupational Stress Questionnaire    Feeling of Stress : Not at all  Social Connections: Moderately Integrated (05/03/2023)   Social Connection and Isolation Panel [NHANES]    Frequency of Communication with Friends and Family: More than three times a week    Frequency of Social Gatherings with Friends and Family: More than three times a week    Attends Religious Services: More than  4 times per year    Active Member of [employer] or Organizations: Yes    Attends Banker Meetings: Never    Marital Status: Widowed    Tobacco Counseling Counseling given: Not Answered   Clinical Intake:  Pre-visit preparation completed: Yes  Pain : No/denies pain     BMI - recorded: 32.23 Nutritional Status: BMI > 30  Obese Nutritional Risks: None Diabetes: Yes (FBS 119) CBG done?: Yes CBG resulted in Enter/ Edit results?: No Did pt. bring in CBG monitor from home?: No  How often do you need to have someone help you when you read instructions, pamphlets, or other written materials from your doctor or pharmacy?: 1 - Never  Interpreter Needed?: No  Information entered by :: R.  LPN   Activities of Daily Living    05/03/2023   10:19 AM  In your present state of health, do you have any difficulty performing the following activities:  Hearing? 0  Vision? 0  Comment readers  Difficulty concentrating or making decisions? 0  Walking or climbing stairs? 0  Dressing or bathing? 0  Doing errands, shopping? 0  Preparing Food and eating ? N  Using the Toilet? N  In the past six months, have you accidently leaked urine? N  Do you have problems with loss of bowel control? N  Managing your Medications? N  Managing your Finances? N  Housekeeping or managing your Housekeeping? N    Patient Care Team: Allegra Grana, FNP as PCP - General (Family Medicine) Benita Gutter, RN as Registered Nurse Rickard Patience, MD as Consulting Physician (Hematology and Oncology)  Indicate any recent Medical Services you may have received from other than [facility] providers in the past year (date may be approximate).     Assessment:   This is a routine wellness examination  for [place].  Hearing/Vision screen Hearing Screening - Comments:: No issues Vision Screening - Comments:: readers  Dietary issues and exercise activities discussed:     Goals Addressed              This Visit's Progress    Patient Stated       Wants to lose some weight       Depression Screen    05/03/2023   10:26 AM 01/20/2023    9:41 AM 10/21/2022    9:29 AM 10/05/2022   10:14 AM 09/27/2022    9:25 AM 08/08/2022   11:15 AM 07/26/2022   11:31 AM  PHQ 2/9 Scores  PHQ - 2 Score 0 0 0 0 0 0 0  PHQ- 9 Score 3  0 0       Fall Risk    05/03/2023   10:21 AM 01/20/2023    9:41 AM 10/21/2022    9:29 AM 10/05/2022   10:14 AM 09/27/2022    9:25 AM  Fall Risk   Falls in the past year? 1 0 0 0 0  Number falls in past yr: 0 0 0 0   Injury with Fall? 0 0 0 0   Risk for fall due to : No Fall Risks;History of fall(s) No Fall Risks No Fall Risks No Fall Risks No Fall Risks  Follow up Falls prevention discussed;Falls evaluation completed Falls evaluation completed Falls evaluation completed Falls evaluation completed Falls evaluation completed    MEDICARE RISK AT HOME:  Medicare Risk at Home - 05/03/23 1021     Any stairs in or around the home? No    If so, are there any without handrails? No    Home free of loose throw rugs in walkways, pet beds, electrical cords, etc? Yes    Adequate lighting in your home to reduce risk of falls? Yes    Life alert? No    Use of a cane, [place] or w/c? No    Grab bars in the bathroom? Yes    Shower chair or bench in shower? Yes    Elevated toilet seat or a handicapped toilet? No                 Cognitive Function:    04/07/2017    9:24 AM 04/07/2016    9:57 AM 04/02/2015    9:33 AM  MMSE - Mini Mental State Exam  Orientation to time 5 5 5   Orientation to Place 5 5 5   Registration 3 3 3   Attention/ Calculation 2 5 5   Attention/Calculation-comments Difficulty performing simple calculation    Recall 3 3 3   Language- name 2 objects 2 2 2   Language- repeat 1 1 1   Language- follow 3 step command 3 3 3   Language- read & follow direction 1 1 1   Write a sentence 1 1 1   Copy design 1 1 1   Total score 27 30 30         05/03/2023   10:32 AM  04/21/2022    9:29 AM 04/20/2021   11:50 AM 04/17/2020   10:04 AM 04/17/2019    9:45 AM  6CIT Screen  What Year? 0 points 0 points 0 points 0 points 0 points  What month? 0 points 0 points 0 points 0 points 0 points  What time? 0 points 0 points 0 points 0 points 0 points  Count back from 20 2 points 0 points 0 points  0 points  Months in reverse 4 points  2 points  0 points  Repeat phrase 6 points 0 points 0 points  0 points  Total Score 12 points  2 points  0 points    Immunizations Immunization History  Administered Date(s) Administered   Fluad Quad(high Dose 65+) 05/24/2019, 06/16/2020, 10/27/2021, 07/18/2022   Influenza, High Dose Seasonal PF 07/18/2017, 07/11/2018   Influenza,inj,Quad PF,6+ Mos 06/13/2013, 06/13/2014, 07/03/2015, 05/11/2016   Influenza-Unspecified 07/18/2012   PFIZER(Purple Top)SARS-COV-2 Vaccination 12/20/2019, 01/14/2020   Pneumococcal Conjugate-13 03/07/2014   Pneumococcal Polysaccharide-23 03/08/2013   Tdap 05/17/2016    TDAP status: Up to date  Flu Vaccine status: Up to date  Pneumococcal vaccine status: Up to date  Covid-19 vaccine status: Completed vaccines  Qualifies for Shingles Vaccine? Yes   Zostavax completed No   Shingrix Completed?: No.    Education has been provided regarding the importance of this vaccine. Patient has been advised to call insurance company to determine out of pocket expense if they have not yet received this vaccine. Advised may also receive vaccine at local pharmacy or Health Dept. Verbalized acceptance and understanding.  Screening Tests Health Maintenance  Topic Date Due   Zoster Vaccines- Shingrix (1 of 2) Never done   COVID-19 Vaccine (3 - Pfizer risk series) 02/11/2020   Medicare Annual Wellness (AWV)  04/22/2023   INFLUENZA VACCINE  04/20/2023   Lung Cancer Screening  05/25/2023   FOOT EXAM  05/20/2023   OPHTHALMOLOGY EXAM  06/01/2023   HEMOGLOBIN A1C  07/23/2023   Diabetic kidney evaluation - eGFR measurement   12/02/2023   Diabetic kidney evaluation - Urine ACR  01/20/2024   Colonoscopy  02/09/2024   DTaP/Tdap/Td (2 - Td or Tdap) 05/17/2026   Pneumonia Vaccine 50+ Years old  Completed   DEXA SCAN  Completed   Hepatitis C Screening  Completed   HPV VACCINES  Aged Out    Health Maintenance  Health Maintenance Due  Topic Date Due   Zoster Vaccines- Shingrix (1 of 2) Never done   COVID-19 Vaccine (3 - Pfizer risk series) 02/11/2020   Medicare Annual Wellness (AWV)  04/22/2023   INFLUENZA VACCINE  04/20/2023   Lung Cancer Screening  05/25/2023    Colorectal cancer screening: Type of screening: Colonoscopy. Completed 5/22. Repeat every 3 years  Mammogram status: Completed 8/23. Repeat every year Had Mammogram 05/02/23  Bone Density status: Completed 8/21. Results reflect: Bone density results: NORMAL. Repeat every 2 years.Will talk with PCP   Lung Cancer Screening: (Low Dose CT Chest recommended if Age 57-80 years, 20 pack-year currently smoking OR have quit w/in 15years.) does qualify.   Lung Cancer Screening Referral: Appointment scheduled 05/26/23  Additional Screening:  Hepatitis C Screening: does qualify; Completed 8/17  Vision Screening: Recommended annual ophthalmology exams for early detection of glaucoma and other disorders of the eye. Is the patient up to date with their annual eye exam?  Yes  Who is the provider or what is the name of the office in which the patient attends annual eye exams? [facility] If pt is not established with a provider, would they like to be referred to a provider to establish care? No .   Dental Screening: Recommended annual dental exams for proper oral hygiene  Diabetic Foot Exam: Diabetic Foot Exam: Completed 05/19/22  Community Resource Referral / Chronic Care Management: CRR required this visit?  No   CCM required this visit?  No     Plan:     I have personally reviewed and noted the following in the  patient's chart:   Medical and  social history Use of alcohol, tobacco or illicit drugs  Current medications and supplements including opioid prescriptions. Patient is currently taking opioid prescriptions. Information provided to patient regarding non-opioid alternatives. Patient advised to discuss non-opioid treatment plan with their provider. Functional ability and status Nutritional status Physical activity Advanced directives List of other physicians Hospitalizations, surgeries, and ER visits in previous 12 months Vitals Screenings to include cognitive, depression, and falls Referrals and appointments  In addition, I have reviewed and discussed with patient certain preventive protocols, quality metrics, and best practice recommendations. A written personalized care plan for preventive services as well as general preventive health recommendations were provided to patient.     Sydell Axon, LPN   [id number]   After Visit Summary: (Declined) Due to this being a telephonic visit, with patients personalized plan was offered to patient but patient Declined AVS at this time   Nurse Notes: None

## 2023-05-03 NOTE — Patient Instructions (Signed)
Cynthia [name] , Thank you for taking time to come for your Medicare Wellness Visit. I appreciate your ongoing commitment to your health goals. Please review the following plan we discussed and let me know if I can assist you in the future.   Referrals/Orders/Follow-Ups/Clinician Recommendations: None  This is a list of the screening recommended for you and due dates:  Health Maintenance  Topic Date Due   Zoster (Shingles) Vaccine (1 of 2) Never done   COVID-19 Vaccine (3 - Pfizer risk series) 02/11/2020   Mammogram  04/30/2023   Flu Shot  04/20/2023   Screening for Lung Cancer  05/25/2023   Complete foot exam   05/20/2023   Eye exam for diabetics  06/01/2023   Hemoglobin A1C  07/23/2023   Yearly kidney function blood test for diabetes  12/02/2023   Yearly kidney health urinalysis for diabetes  01/20/2024   Colon Cancer Screening  02/09/2024   Medicare Annual Wellness Visit  05/02/2024   DTaP/Tdap/Td vaccine (2 - Td or Tdap) 05/17/2026   Pneumonia Vaccine  Completed   DEXA scan (bone density measurement)  Completed   Hepatitis C Screening  Completed   HPV Vaccine  Aged Out    Advanced directives: (Declined) Advance directive discussed with you today. Even though you declined this today, please call our office should you change your mind, and we can give you the proper paperwork for you to fill out. Will pick up paperwork at next visit.  Next Medicare Annual Wellness Visit scheduled for next year: Yes 05/06/24 @ 9:00 Managing Pain Without Opioids Opioids are strong medicines used to treat moderate to severe pain. For some people, especially those who have long-term (chronic) pain, opioids may not be the best choice for pain management due to: Side effects like nausea, constipation, and sleepiness. The risk of addiction (opioid use disorder). The longer you take opioids, the greater your risk of addiction. Pain that lasts for more than 3 months is called chronic pain. Managing chronic pain  usually requires more than one approach and is often provided by a team of health care providers working together (multidisciplinary approach). Pain management may be done at a pain management center or pain clinic. How to manage pain without the use of opioids Use non-opioid medicines Non-opioid medicines for pain may include: Over-the-counter or prescription non-steroidal anti-inflammatory drugs (NSAIDs). These may be the first medicines used for pain. They work well for muscle and bone pain, and they reduce swelling. Acetaminophen. This over-the-counter medicine may work well for milder pain but not swelling. Antidepressants. These may be used to treat chronic pain. A certain type of antidepressant (tricyclics) is often used. These medicines are given in lower doses for pain than when used for depression. Anticonvulsants. These are usually used to treat seizures but may also reduce nerve (neuropathic) pain. Muscle relaxants. These relieve pain caused by sudden muscle tightening (spasms). You may also use a pain medicine that is applied to the skin as a patch, cream, or gel (topical analgesic), such as a numbing medicine. These may cause fewer side effects than medicines taken by mouth. Do certain therapies as directed Some therapies can help with pain management. They include: Physical therapy. You will do exercises to gain strength and flexibility. A physical therapist may teach you exercises to move and stretch parts of your body that are weak, stiff, or painful. You can learn these exercises at physical therapy visits and practice them at home. Physical therapy may also involve: Massage. Heat wraps  or applying heat or cold to affected areas. Electrical signals that interrupt pain signals (transcutaneous electrical nerve stimulation, TENS). Weak lasers that reduce pain and swelling (low-level laser therapy). Signals from your body that help you learn to regulate pain  (biofeedback). Occupational therapy. This helps you to learn ways to function at home and work with less pain. Recreational therapy. This involves trying new activities or hobbies, such as a physical activity or drawing. Mental health therapy, including: Cognitive behavioral therapy (CBT). This helps you learn coping skills for dealing with pain. Acceptance and commitment therapy (ACT) to change the way you think and react to pain. Relaxation therapies, including muscle relaxation exercises and mindfulness-based stress reduction. Pain management counseling. This may be individual, family, or group counseling.  Receive medical treatments Medical treatments for pain management include: Nerve block injections. These may include a pain blocker and anti-inflammatory medicines. You may have injections: Near the spine to relieve chronic back or neck pain. Into joints to relieve back or joint pain. Into nerve areas that supply a painful area to relieve body pain. Into muscles (trigger point injections) to relieve some painful muscle conditions. A medical device placed near your spine to help block pain signals and relieve nerve pain or chronic back pain (spinal cord stimulation device). Acupuncture. Follow these instructions at home Medicines Take over-the-counter and prescription medicines only as told by your health care provider. If you are taking pain medicine, ask your health care providers about possible side effects to watch out for. Do not drive or use heavy machinery while taking prescription opioid pain medicine. Lifestyle  Do not use drugs or alcohol to reduce pain. If you drink alcohol, limit how much you have to: 0-1 drink a day for women who are not pregnant. 0-2 drinks a day for men. Know how much alcohol is in a drink. In the [place] one drink equals one 12 oz bottle of beer (355 mL), one 5 oz glass of wine (148 mL), or one 1 oz glass of hard liquor (44 mL). Do not use any  products that contain nicotine or tobacco. These products include cigarettes, chewing tobacco, and vaping devices, such as e-cigarettes. If you need help quitting, ask your health care provider. Eat a healthy diet and maintain a healthy weight. Poor diet and excess weight may make pain worse. Eat foods that are high in fiber. These include fresh fruits and vegetables, whole grains, and beans. Limit foods that are high in fat and processed sugars, such as fried and sweet foods. Exercise regularly. Exercise lowers stress and may help relieve pain. Ask your health care provider what activities and exercises are safe for you. If your health care provider approves, join an exercise class that combines movement and stress reduction. Examples include yoga and tai chi. Get enough sleep. Lack of sleep may make pain worse. Lower stress as much as possible. Practice stress reduction techniques as told by your therapist. General instructions Work with all your pain management providers to find the treatments that work best for you. You are an important member of your pain management team. There are many things you can do to reduce pain on your own. Consider joining an online or in-person support group for people who have chronic pain. Keep all follow-up visits. This is important. Where to find more information You can find more information about managing pain without opioids from: American Academy of Pain Medicine: painmed.org Institute for Chronic Pain: instituteforchronicpain.org American Chronic Pain Association: theacpa.org Contact a health  care provider if: You have side effects from pain medicine. Your pain gets worse or does not get better with treatments or home therapy. You are struggling with anxiety or depression. Summary Many types of pain can be managed without opioids. Chronic pain may respond better to pain management without opioids. Pain is best managed when you and a team of health care  providers work together. Pain management without opioids may include non-opioid medicines, medical treatments, physical therapy, mental health therapy, and lifestyle changes. Tell your health care providers if your pain gets worse or is not being managed well enough. This information is not intended to replace advice given to you by your health care provider. Make sure you discuss any questions you have with your health care provider. Document Revised: 12/16/2020 Document Reviewed: 12/16/2020 Elsevier Patient Education  2024 [employer].   Preventive Care 65 Years and Older, Female Preventive care refers to lifestyle choices and visits with your health care provider that can promote health and wellness. What does preventive care include? A yearly physical exam. This is also called an annual well check. Dental exams once or twice a year. Routine eye exams. Ask your health care provider how often you should have your eyes checked. Personal lifestyle choices, including: Daily care of your teeth and gums. Regular physical activity. Eating a healthy diet. Avoiding tobacco and drug use. Limiting alcohol use. Practicing safe sex. Taking low-dose aspirin every day. Taking vitamin and mineral supplements as recommended by your health care provider. What happens during an annual well check? The services and screenings done by your health care provider during your annual well check will depend on your age, overall health, lifestyle risk factors, and family history of disease. Counseling  Your health care provider may ask you questions about your: Alcohol use. Tobacco use. Drug use. Emotional well-being. Home and relationship well-being. Sexual activity. Eating habits. History of falls. Memory and ability to understand (cognition). Work and work Astronomer. Reproductive health. Screening  You may have the following tests or measurements: Height, weight, and BMI. Blood pressure. Lipid  and cholesterol levels. These may be checked every 5 years, or more frequently if you are over 69 years old. Skin check. Lung cancer screening. You may have this screening every year starting at age 50 if you have a 30-pack-year history of smoking and currently smoke or have quit within the past 15 years. Fecal occult blood test (FOBT) of the stool. You may have this test every year starting at age 14. Flexible sigmoidoscopy or colonoscopy. You may have a sigmoidoscopy every 5 years or a colonoscopy every 10 years starting at age 59. Hepatitis C blood test. Hepatitis B blood test. Sexually transmitted disease (STD) testing. Diabetes screening. This is done by checking your blood sugar (glucose) after you have not eaten for a while (fasting). You may have this done every 1-3 years. Bone density scan. This is done to screen for osteoporosis. You may have this done starting at age 41. Mammogram. This may be done every 1-2 years. Talk to your health care provider about how often you should have regular mammograms. Talk with your health care provider about your test results, treatment options, and if necessary, the need for more tests. Vaccines  Your health care provider may recommend certain vaccines, such as: Influenza vaccine. This is recommended every year. Tetanus, diphtheria, and acellular pertussis (Tdap, Td) vaccine. You may need a Td booster every 10 years. Zoster vaccine. You may need this after age 63. Pneumococcal  13-valent conjugate (PCV13) vaccine. One dose is recommended after age 62. Pneumococcal polysaccharide (PPSV23) vaccine. One dose is recommended after age 64. Talk to your health care provider about which screenings and vaccines you need and how often you need them. This information is not intended to replace advice given to you by your health care provider. Make sure you discuss any questions you have with your health care provider. Document Released: 10/02/2015 Document  Revised: 05/25/2016 Document Reviewed: 07/07/2015 Elsevier Interactive Patient Education  2017 [employer].  Fall Prevention in the Home Falls can cause injuries. They can happen to people of all ages. There are many things you can do to make your home safe and to help prevent falls. What can I do on the outside of my home? Regularly fix the edges of walkways and driveways and fix any cracks. Remove anything that might make you trip as you walk through a door, such as a raised step or threshold. Trim any bushes or trees on the path to your home. Use bright outdoor lighting. Clear any walking paths of anything that might make someone trip, such as rocks or tools. Regularly check to see if handrails are loose or broken. Make sure that both sides of any steps have handrails. Any raised decks and porches should have guardrails on the edges. Have any leaves, snow, or ice cleared regularly. Use sand or salt on walking paths during winter. Clean up any spills in your garage right away. This includes oil or grease spills. What can I do in the bathroom? Use night lights. Install grab bars by the toilet and in the tub and shower. Do not use towel bars as grab bars. Use non-skid mats or decals in the tub or shower. If you need to sit down in the shower, use a plastic, non-slip stool. Keep the floor dry. Clean up any water that spills on the floor as soon as it happens. Remove soap buildup in the tub or shower regularly. Attach bath mats securely with double-sided non-slip rug tape. Do not have throw rugs and other things on the floor that can make you trip. What can I do in the bedroom? Use night lights. Make sure that you have a light by your bed that is easy to reach. Do not use any sheets or blankets that are too big for your bed. They should not hang down onto the floor. Have a firm chair that has side arms. You can use this for support while you get dressed. Do not have throw rugs and other  things on the floor that can make you trip. What can I do in the kitchen? Clean up any spills right away. Avoid walking on wet floors. Keep items that you use a lot in easy-to-reach places. If you need to reach something above you, use a strong step stool that has a grab bar. Keep electrical cords out of the way. Do not use floor polish or wax that makes floors slippery. If you must use wax, use non-skid floor wax. Do not have throw rugs and other things on the floor that can make you trip. What can I do with my stairs? Do not leave any items on the stairs. Make sure that there are handrails on both sides of the stairs and use them. Fix handrails that are broken or loose. Make sure that handrails are as long as the stairways. Check any carpeting to make sure that it is firmly attached to the stairs. Fix any carpet  that is loose or worn. Avoid having throw rugs at the top or bottom of the stairs. If you do have throw rugs, attach them to the floor with carpet tape. Make sure that you have a light switch at the top of the stairs and the bottom of the stairs. If you do not have them, ask someone to add them for you. What else can I do to help prevent falls? Wear shoes that: Do not have high heels. Have rubber bottoms. Are comfortable and fit you well. Are closed at the toe. Do not wear sandals. If you use a stepladder: Make sure that it is fully opened. Do not climb a closed stepladder. Make sure that both sides of the stepladder are locked into place. Ask someone to hold it for you, if possible. Clearly mark and make sure that you can see: Any grab bars or handrails. First and last steps. Where the edge of each step is. Use tools that help you move around (mobility aids) if they are needed. These include: Canes. Walkers. Scooters. Crutches. Turn on the lights when you go into a dark area. Replace any light bulbs as soon as they burn out. Set up your furniture so you have a clear  path. Avoid moving your furniture around. If any of your floors are uneven, fix them. If there are any pets around you, be aware of where they are. Review your medicines with your doctor. Some medicines can make you feel dizzy. This can increase your chance of falling. Ask your doctor what other things that you can do to help prevent falls. This information is not intended to replace advice given to you by your health care provider. Make sure you discuss any questions you have with your health care provider. Document Released: 07/02/2009 Document Revised: 02/11/2016 Document Reviewed: 10/10/2014 Elsevier Interactive Patient Education  2017 [employer].

## 2023-05-08 ENCOUNTER — Ambulatory Visit: Payer: Self-pay

## 2023-05-08 NOTE — Patient Outreach (Signed)
  Care Coordination   05/08/2023 Name: Cynthia [name] MRN: [id number] DOB: July 02, 1946   Care Coordination Outreach Attempts:  An unsuccessful telephone outreach was attempted today to offer the patient information about available care coordination services.  Follow Up Plan:  Additional outreach attempts will be made to offer the patient care coordination information and services.   Encounter Outcome:  No Answer   Care Coordination Interventions:  No, not indicated    Lysle Morales, BSW Social Worker [facility] Care Management  [phone]

## 2023-05-11 ENCOUNTER — Other Ambulatory Visit: Payer: Self-pay | Admitting: Family

## 2023-05-11 DIAGNOSIS — E039 Hypothyroidism, unspecified: Secondary | ICD-10-CM

## 2023-05-16 DIAGNOSIS — C642 Malignant neoplasm of left kidney, except renal pelvis: Secondary | ICD-10-CM | POA: Diagnosis not present

## 2023-05-16 DIAGNOSIS — N1831 Chronic kidney disease, stage 3a: Secondary | ICD-10-CM | POA: Diagnosis not present

## 2023-05-16 DIAGNOSIS — I10 Essential (primary) hypertension: Secondary | ICD-10-CM | POA: Diagnosis not present

## 2023-05-16 DIAGNOSIS — J449 Chronic obstructive pulmonary disease, unspecified: Secondary | ICD-10-CM | POA: Diagnosis not present

## 2023-05-16 DIAGNOSIS — R809 Proteinuria, unspecified: Secondary | ICD-10-CM | POA: Diagnosis not present

## 2023-05-16 DIAGNOSIS — E871 Hypo-osmolality and hyponatremia: Secondary | ICD-10-CM | POA: Diagnosis not present

## 2023-05-16 DIAGNOSIS — K219 Gastro-esophageal reflux disease without esophagitis: Secondary | ICD-10-CM | POA: Diagnosis not present

## 2023-05-16 DIAGNOSIS — E1122 Type 2 diabetes mellitus with diabetic chronic kidney disease: Secondary | ICD-10-CM | POA: Diagnosis not present

## 2023-05-16 DIAGNOSIS — R609 Edema, unspecified: Secondary | ICD-10-CM | POA: Diagnosis not present

## 2023-05-26 ENCOUNTER — Ambulatory Visit: Payer: Medicare Other | Attending: Family

## 2023-05-29 ENCOUNTER — Telehealth: Payer: Self-pay

## 2023-05-29 DIAGNOSIS — E039 Hypothyroidism, unspecified: Secondary | ICD-10-CM

## 2023-05-29 MED ORDER — LEVOTHYROXINE SODIUM 137 MCG PO TABS: ORAL_TABLET | ORAL | 1 refills | Status: DC

## 2023-05-29 NOTE — Telephone Encounter (Signed)
Prescription Request  05/29/2023  LOV: Visit date not found  What is the name of the medication or equipment? levothyroxine (SYNTHROID) 137 MCG tablet  Have you contacted your pharmacy to request a refill? No   Which pharmacy would you like this sent to?  [facility] [address], [place] - [address] [place] [address] Phone: [phone] Fax: [phone]    Patient notified that their request is being sent to the clinical staff for review and that they should receive a response within 2 business days.   Please advise at Mobile [phone] (mobile)  Patient states she only has two pills left, so she would like for [place] to fill this as soon as possible.

## 2023-05-29 NOTE — Telephone Encounter (Signed)
LVM to inform pt that refill of Synthroid was sent in to the pharmacy

## 2023-05-30 ENCOUNTER — Other Ambulatory Visit: Payer: Self-pay | Admitting: Family

## 2023-05-30 DIAGNOSIS — I10 Essential (primary) hypertension: Secondary | ICD-10-CM

## 2023-05-30 NOTE — Telephone Encounter (Signed)
Pt is aware.  

## 2023-06-02 ENCOUNTER — Encounter: Payer: Self-pay | Admitting: Podiatry

## 2023-06-02 ENCOUNTER — Ambulatory Visit: Payer: Medicare Other | Admitting: Podiatry

## 2023-06-02 VITALS — BP 118/71 | HR 72

## 2023-06-02 DIAGNOSIS — E1140 Type 2 diabetes mellitus with diabetic neuropathy, unspecified: Secondary | ICD-10-CM | POA: Diagnosis not present

## 2023-06-02 DIAGNOSIS — M79674 Pain in right toe(s): Secondary | ICD-10-CM | POA: Diagnosis not present

## 2023-06-02 DIAGNOSIS — B351 Tinea unguium: Secondary | ICD-10-CM | POA: Diagnosis not present

## 2023-06-02 DIAGNOSIS — E119 Type 2 diabetes mellitus without complications: Secondary | ICD-10-CM | POA: Diagnosis not present

## 2023-06-02 DIAGNOSIS — M79675 Pain in left toe(s): Secondary | ICD-10-CM

## 2023-06-02 NOTE — Progress Notes (Unsigned)
Subjective:  Patient ID: Cynthia [name], female    DOB: 04/30/1946,  MRN: [id number]  No chief complaint on file.    77 y.o. female presents with {jgcomplaint:23593}.    Patient states blood glucose was *** mg/dl {Time; today/yesterday/ 2 days ago:19188}.    Last known  HgA1c was ***%.    Last known HgA1c was unknown.  Patient did not check blood glucose this morning.  Patient does not monitor blood glucose daily.  Patient is not required to monitor blood glucose daily.  New problem(s): None  {jgcomplaint:23593}  PCP: Allegra Grana, FNP and last visit was: {Time; dates multiple:304500300} ***.  Review of Systems: Negative except as noted in the HPI.   No Known Allergies  Objective:  There were no vitals filed for this visit. Constitutional Patient is a pleasant 77 y.o. female {jgbodyhabitus:24098} AAO x 3.  Vascular Capillary fill time to digits immediate b/l.  DP/PT pulse(s) are palpable b/l lower extremities. Pedal hair sparse. Lower extremity skin temperature gradient within normal limits. No pain with calf compression b/l. No edema noted b/l lower extremities. No cyanosis or clubbing noted. {jgvascular:23595}  Neurologic Protective sensation intact 5/5 intact bilaterally with 10g monofilament b/l. Vibratory sensation intact b/l. No clonus b/l. {jgneuro:23601::"Protective sensation intact 5/5 intact bilaterally with 10g monofilament b/l.","Vibratory sensation intact b/l.","Proprioception intact bilaterally."}  Dermatologic Pedal skin is warm and supple b/l.  No open wounds b/l lower extremities. No interdigital macerations b/l lower extremities. Toenails 1-5 b/l elongated, discolored, dystrophic, thickened, crumbly with subungual debris and tenderness to dorsal palpation. {jgderm:23598}  Orthopedic: Normal muscle strength 5/5 to all lower extremity muscle groups bilaterally. Patient ambulates independent of any assistive aids. {jgmsk:23600}      Latest Ref Rng & Units  01/20/2023   10:12 AM 06/06/2022   10:00 AM  Hemoglobin A1C  Hemoglobin-A1c 4.6 - 6.5 % 6.9  6.4    Radiographs:  None  Assessment:   1. Pain due to onychomycosis of toenails of both feet   2. Type 2 diabetes mellitus with diabetic neuropathy, without long-term current use of insulin (HCC)    Plan:  No orders of the defined types were placed in this encounter.    No orders of the defined types were placed in this encounter.   Patient was evaluated and treated and all questions answered. Consent given for treatment as described below: {jgplan:23602::"-Patient/POA to call should there be question/concern in the interim."}  Return in about 3 months (around 09/01/2023).  Freddie Breech, DPM

## 2023-06-12 DIAGNOSIS — E119 Type 2 diabetes mellitus without complications: Secondary | ICD-10-CM | POA: Diagnosis not present

## 2023-06-12 DIAGNOSIS — H2513 Age-related nuclear cataract, bilateral: Secondary | ICD-10-CM | POA: Diagnosis not present

## 2023-06-12 LAB — HM DIABETES EYE EXAM

## 2023-06-15 ENCOUNTER — Other Ambulatory Visit: Payer: Self-pay | Admitting: Family

## 2023-06-15 DIAGNOSIS — G8929 Other chronic pain: Secondary | ICD-10-CM

## 2023-06-15 DIAGNOSIS — M1990 Unspecified osteoarthritis, unspecified site: Secondary | ICD-10-CM

## 2023-06-19 ENCOUNTER — Telehealth: Payer: Self-pay | Admitting: Family

## 2023-06-19 DIAGNOSIS — M545 Low back pain, unspecified: Secondary | ICD-10-CM

## 2023-06-19 DIAGNOSIS — G8929 Other chronic pain: Secondary | ICD-10-CM

## 2023-06-19 DIAGNOSIS — M1990 Unspecified osteoarthritis, unspecified site: Secondary | ICD-10-CM

## 2023-06-19 MED ORDER — TRAMADOL HCL 50 MG PO TABS: 50.0000 mg | ORAL_TABLET | Freq: Two times a day (BID) | ORAL | 2 refills | Status: DC | PRN

## 2023-06-19 NOTE — Telephone Encounter (Signed)
Call pt Cynthia [name] DM f/u appt, she is over due  I have refilled tramadol .  I declined refill of meloxicam due to the setting of chronic kidney disease. I have discontinued from chart as well.    Please advise that meloxicam is similar to Aleve, ibuprofen, Advil and those medications can affect her kidney function.  I would advise her to use Tylenol and tramadol (sparingly) for pain

## 2023-06-19 NOTE — Telephone Encounter (Signed)
LVM to  inform pt that she needs to sch DM f/u appt, she is over due   I have refilled tramadol .  I declined refill of meloxicam due to the setting of chronic kidney disease. I have discontinued from chart as well.     Please advise that meloxicam is similar to Aleve, ibuprofen, Advil and those medications can affect her kidney function.  I would advise her to use Tylenol and tramadol (sparingly) for pain

## 2023-06-19 NOTE — Telephone Encounter (Signed)
Patient just called and needs 2 refills. The first is meloxicam (MOBIC) 7.5 MG tablet and the 2nd is traMADol (ULTRAM) 50 MG tablet. [facility] uses [facility] [address] [place] - [address] [place], [place] [address] Phone: [phone]  Fax: [phone]

## 2023-06-26 NOTE — Telephone Encounter (Signed)
Pt is scheduled for DM f/up on 1-/9/24 and she is aware of d/c medication and will take Tylenol and Tramadol if needed

## 2023-06-28 ENCOUNTER — Encounter: Payer: Self-pay | Admitting: Family

## 2023-06-28 ENCOUNTER — Ambulatory Visit: Payer: Medicare Other | Admitting: Family

## 2023-06-28 VITALS — BP 130/80 | HR 84 | Temp 97.8°F | Ht 68.0 in | Wt 223.6 lb

## 2023-06-28 DIAGNOSIS — Z23 Encounter for immunization: Secondary | ICD-10-CM

## 2023-06-28 DIAGNOSIS — C642 Malignant neoplasm of left kidney, except renal pelvis: Secondary | ICD-10-CM

## 2023-06-28 DIAGNOSIS — E1140 Type 2 diabetes mellitus with diabetic neuropathy, unspecified: Secondary | ICD-10-CM

## 2023-06-28 DIAGNOSIS — Z7984 Long term (current) use of oral hypoglycemic drugs: Secondary | ICD-10-CM | POA: Diagnosis not present

## 2023-06-28 DIAGNOSIS — I10 Essential (primary) hypertension: Secondary | ICD-10-CM | POA: Diagnosis not present

## 2023-06-28 LAB — POCT GLYCOSYLATED HEMOGLOBIN (HGB A1C): Hemoglobin A1C: 6.5 % — AB (ref 4.0–5.6)

## 2023-06-28 MED ORDER — HYDROCHLOROTHIAZIDE 25 MG PO TABS
25.0000 mg | ORAL_TABLET | Freq: Every day | ORAL | 3 refills | Status: DC
Start: 2023-06-28 — End: 2023-10-19

## 2023-06-28 NOTE — Assessment & Plan Note (Signed)
Lab Results  Component Value Date   HGBA1C 6.5 (A) 06/28/2023  Excellent control  Continue metformin 500 mg twice daily

## 2023-06-28 NOTE — Assessment & Plan Note (Addendum)
She maintains close follow-up with Dr. Cathie Hoops.  I ordered MRI abdomen for surveillance of left renal cell carcinoma.  Due January 2025.  Follow-up with Dr. Cathie Hoops March 2025.

## 2023-06-28 NOTE — Assessment & Plan Note (Signed)
Chronic, stable.  continue amlodipine to 5 mg QD, hydrochlorothiazide  25 mg QD, losartan 50 mg QD

## 2023-06-28 NOTE — Progress Notes (Signed)
Assessment & Plan:  Type 2 diabetes mellitus with diabetic neuropathy, without long-term current use of insulin (HCC) Assessment & Plan: Lab Results  Component Value Date   HGBA1C 6.5 (A) 06/28/2023  Excellent control  Continue metformin 500 mg twice daily  Orders: -     POCT glycosylated hemoglobin (Hb A1C)  Need for influenza vaccination -     Flu Vaccine Trivalent High Dose (Fluad)  Renal carcinoma, left (HCC) Assessment & Plan: She maintains close follow-up with Dr. Cathie Hoops.  I ordered MRI abdomen for surveillance of left renal cell carcinoma.  Due January 2025.  Follow-up with Dr. Cathie Hoops March 2025.   Orders: -     MR ABDOMEN W WO CONTRAST; Future  Essential hypertension -     hydroCHLOROthiazide; Take 1 tablet (25 mg total) by mouth daily.  Dispense: 90 tablet; Refill: 3  Primary hypertension Assessment & Plan: Chronic, stable.  continue amlodipine to 5 mg QD, hydrochlorothiazide  25 mg QD, losartan 50 mg QD      Return precautions given.   Risks, benefits, and alternatives of the medications and treatment plan prescribed today were discussed, and patient expressed understanding.   Education regarding symptom management and diagnosis given to patient on AVS either electronically or printed.  No follow-ups on file.  Rennie Plowman, FNP  Subjective:    Patient ID: Cynthia [name], female    DOB: [birth year], 77 y.o.   MRN: [id number]  CC: ASON STURM is a 77 y.o. female who presents today for follow up.   HPI: Feels well today No new complaints     Vaginal bleeding has resolved.    Follow-up nephrology 05/16/2023.  No medication changes  Compliant with amlodipine, metoprolol, losartan  She takes mobic prn for right knee pain.   Follow-up pulmonology 12/28/2022.  Remains compliant with albuterol daily in the morning, Anora Ellipta. Occasional wheezing at bedtime. No sinus congestion, PND, cough, fever, sob, leg swelling.   Following with Dr. 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  Repeat MRI January 2025.  Follow-up with Dr. Cathie Hoops March 2025   Allergies: Patient has no known allergies. Current Outpatient Medications on File Prior to Visit  Medication Sig Dispense Refill   albuterol (VENTOLIN HFA) 108 (90 Base) MCG/ACT inhaler Inhale 2 puffs into the lungs every 6 (six) hours as needed for wheezing or shortness of breath. 8 g 2   amLODipine (NORVASC) 2.5 MG tablet TAKE 2 TABLETS BY MOUTH EVERY DAY 90 tablet 1   aspirin EC 81 MG tablet Take 81 mg by mouth daily. Swallow whole.     atorvastatin (LIPITOR) 20 MG tablet TAKE 1 TABLET BY MOUTH EVERY DAY 90 tablet 3   blood glucose meter kit and supplies KIT Dispense based on patient and insurance preference. Use up to four times daily as directed. 1 each 0   Calcium Carbonate-Vit D-Min (CALCIUM 600+D PLUS MINERALS) 600-400 MG-UNIT TABS Take 1 tablet by mouth daily.      diclofenac sodium (VOLTAREN) 1 % GEL Apply 1 application  topically 4 (four) times daily as needed (knee pain.).     DULoxetine (CYMBALTA) 60 MG capsule Take 1 capsule (60 mg total) by mouth daily. 90 capsule 3   Lancet Devices (ONE TOUCH DELICA LANCING DEV) MISC 1 Device by Does not apply route daily. 1 each 2   Lancets (ONETOUCH DELICA PLUS LANCET30G) MISC USE AS INSTRUCTED 100 each 12   levothyroxine (SYNTHROID) 137 MCG  tablet TAKE 1 TABLET BY MOUTH EVERY DAY BEFORE BREAKFAST 90 tablet 1   losartan (COZAAR) 50 MG tablet TAKE 1 TABLET BY MOUTH EVERY DAY 90 tablet 1   metFORMIN (GLUCOPHAGE) 500 MG tablet TAKE 1 TABLET BY MOUTH TWICE A DAY 180 tablet 1   metoprolol tartrate (LOPRESSOR) 25 MG tablet Take 1 tablet (25 mg total) by mouth 2 (two) times daily. 90 tablet 1   ONETOUCH ULTRA test strip TEST BLOOD SUGAR 1-2 TIMES A DAY 100 strip 5   potassium chloride SA (KLOR-CON M) 20 MEQ tablet Take by mouth.     traMADol (ULTRAM) 50 MG tablet Take 1 tablet (50 mg total) by mouth every 12  (twelve) hours as needed for severe pain. 30 tablet 2   umeclidinium-vilanterol (ANORO ELLIPTA) 62.5-25 MCG/ACT AEPB Inhale 1 puff into the lungs daily. 30 each 11   No current facility-administered medications on file prior to visit.    Review of Systems  Constitutional:  Negative for chills and fever.  Respiratory:  Negative for cough.   Cardiovascular:  Negative for chest pain and palpitations.  Gastrointestinal:  Negative for nausea and vomiting.  Musculoskeletal:  Positive for arthralgias (chronic knee pain).      Objective:    BP 130/80   Pulse 84   Temp 97.8 F (36.6 C) (Oral)   Ht 5\' 8"  (1.727 m)   Wt 223 lb 9.6 oz (101.4 kg)   SpO2 97%   BMI 34.00 kg/m  BP Readings from Last 3 Encounters:  06/28/23 130/80  06/02/23 118/71  02/23/23 111/69   Wt Readings from Last 3 Encounters:  06/28/23 223 lb 9.6 oz (101.4 kg)  05/03/23 212 lb (96.2 kg)  01/20/23 213 lb (96.6 kg)    Physical Exam Vitals reviewed.  Constitutional:      Appearance: She is well-developed.  Eyes:     Conjunctiva/sclera: Conjunctivae normal.  Cardiovascular:     Rate and Rhythm: Normal rate and regular rhythm.     Pulses: Normal pulses.     Heart sounds: Normal heart sounds.  Pulmonary:     Effort: Pulmonary effort is normal.     Breath sounds: Normal breath sounds. No wheezing, rhonchi or rales.  Skin:    General: Skin is warm and dry.  Neurological:     Mental Status: She is alert.  Psychiatric:        Speech: Speech normal.        Behavior: Behavior normal.        Thought Content: Thought content normal.

## 2023-06-28 NOTE — Patient Instructions (Addendum)
Call to make an appointment for Annual Lung cancer screen  With CT Chest:  [phone]. Let me know if any issues in doing so.  DO not use meloxicam as this is related to ibuprofen  Use tylenol arthritis 650mg  daily in the morning and take tramadol 50mg  daily or once daily as needed.   Use albuterol inhaler in the morning and prior to bedtime. Let me know if wheezing continues.   I have ordered MRI abdomen to monitor left renal cancer.   Let us know if you dont hear back within a week in regards to an appointment being scheduled.   So that you are aware, if you are [facility] MyChart user , please pay attention to your MyChart messages as you may receive a MyChart message with a phone number to call and schedule this test/appointment own your own from our referral coordinator. This is a new process so I do not want you to miss this message.  If you are not a MyChart user, you will receive a phone call.

## 2023-07-10 DIAGNOSIS — I2510 Atherosclerotic heart disease of native coronary artery without angina pectoris: Secondary | ICD-10-CM | POA: Diagnosis not present

## 2023-07-10 DIAGNOSIS — E1140 Type 2 diabetes mellitus with diabetic neuropathy, unspecified: Secondary | ICD-10-CM | POA: Diagnosis not present

## 2023-07-10 DIAGNOSIS — Z87891 Personal history of nicotine dependence: Secondary | ICD-10-CM | POA: Diagnosis not present

## 2023-07-10 DIAGNOSIS — I700 Atherosclerosis of aorta: Secondary | ICD-10-CM | POA: Diagnosis not present

## 2023-07-10 DIAGNOSIS — J441 Chronic obstructive pulmonary disease with (acute) exacerbation: Secondary | ICD-10-CM | POA: Diagnosis not present

## 2023-07-10 DIAGNOSIS — E782 Mixed hyperlipidemia: Secondary | ICD-10-CM | POA: Diagnosis not present

## 2023-07-10 DIAGNOSIS — I10 Essential (primary) hypertension: Secondary | ICD-10-CM | POA: Diagnosis not present

## 2023-07-10 DIAGNOSIS — N182 Chronic kidney disease, stage 2 (mild): Secondary | ICD-10-CM | POA: Diagnosis not present

## 2023-07-18 DIAGNOSIS — R059 Cough, unspecified: Secondary | ICD-10-CM | POA: Diagnosis not present

## 2023-07-18 DIAGNOSIS — U071 COVID-19: Secondary | ICD-10-CM | POA: Diagnosis not present

## 2023-07-18 DIAGNOSIS — J209 Acute bronchitis, unspecified: Secondary | ICD-10-CM | POA: Diagnosis not present

## 2023-07-27 DIAGNOSIS — U071 COVID-19: Secondary | ICD-10-CM | POA: Diagnosis not present

## 2023-07-27 DIAGNOSIS — J208 Acute bronchitis due to other specified organisms: Secondary | ICD-10-CM | POA: Diagnosis not present

## 2023-07-31 ENCOUNTER — Telehealth: Payer: Self-pay | Admitting: Family

## 2023-07-31 DIAGNOSIS — M545 Low back pain, unspecified: Secondary | ICD-10-CM

## 2023-07-31 NOTE — Telephone Encounter (Signed)
Prescription Request  07/31/2023  LOV: 06/28/2023  What is the name of the medication or equipment?  traMADol (ULTRAM) 50 MG tablet    Have you contacted your pharmacy to request a refill? No   Which pharmacy would you like this sent to?  [facility] [address] [place] - [address] [place] [address] Phone: [phone] Fax: [phone]    Patient notified that their request is being sent to the clinical staff for review and that they should receive a response within 2 business days.   Please advise at [facility] [phone]

## 2023-08-01 MED ORDER — TRAMADOL HCL 50 MG PO TABS
50.0000 mg | ORAL_TABLET | Freq: Two times a day (BID) | ORAL | 2 refills | Status: AC | PRN
Start: 2023-08-01 — End: ?

## 2023-08-01 NOTE — Telephone Encounter (Signed)
Refilled

## 2023-08-01 NOTE — Addendum Note (Signed)
Addended by: Allegra Grana on: 08/01/2023 10:21 AM   Modules accepted: Orders

## 2023-08-02 NOTE — Telephone Encounter (Signed)
Error

## 2023-08-03 ENCOUNTER — Other Ambulatory Visit: Payer: Self-pay | Admitting: Student in an Organized Health Care Education/Training Program

## 2023-08-03 DIAGNOSIS — R0602 Shortness of breath: Secondary | ICD-10-CM

## 2023-08-15 DIAGNOSIS — M1711 Unilateral primary osteoarthritis, right knee: Secondary | ICD-10-CM | POA: Diagnosis not present

## 2023-08-22 ENCOUNTER — Other Ambulatory Visit: Payer: Self-pay | Admitting: Family

## 2023-08-22 DIAGNOSIS — I10 Essential (primary) hypertension: Secondary | ICD-10-CM

## 2023-08-23 ENCOUNTER — Telehealth: Payer: Self-pay

## 2023-08-23 DIAGNOSIS — E039 Hypothyroidism, unspecified: Secondary | ICD-10-CM

## 2023-08-23 MED ORDER — LEVOTHYROXINE SODIUM 137 MCG PO TABS
ORAL_TABLET | ORAL | 1 refills | Status: DC
Start: 2023-08-23 — End: 2023-10-19

## 2023-08-23 NOTE — Telephone Encounter (Signed)
Prescription Request  08/23/2023  LOV: Visit date not found  What is the name of the medication or equipment? levothyroxine (SYNTHROID) 137 MCG tablet  Have you contacted your pharmacy to request a refill? No   Which pharmacy would you like this sent to?  [facility] [address] [place] - [address] [place] [address] Phone: [phone] Fax: [phone]    Patient notified that their request is being sent to the clinical staff for review and that they should receive a response within 2 business days.   Please advise at Mobile [phone] (mobile)  Patient states she has 3-4 pills left.

## 2023-08-23 NOTE — Telephone Encounter (Signed)
Rx for Synthroid sent in to pharmacy pt has been notified

## 2023-08-31 ENCOUNTER — Other Ambulatory Visit: Payer: Self-pay | Admitting: Student in an Organized Health Care Education/Training Program

## 2023-08-31 DIAGNOSIS — Z87891 Personal history of nicotine dependence: Secondary | ICD-10-CM

## 2023-08-31 DIAGNOSIS — J432 Centrilobular emphysema: Secondary | ICD-10-CM

## 2023-09-01 ENCOUNTER — Ambulatory Visit: Payer: Medicare Other | Admitting: Podiatry

## 2023-09-07 ENCOUNTER — Ambulatory Visit: Payer: Medicare Other | Admitting: Podiatry

## 2023-09-07 ENCOUNTER — Encounter: Payer: Self-pay | Admitting: Podiatry

## 2023-09-07 ENCOUNTER — Other Ambulatory Visit: Payer: Self-pay | Admitting: Family

## 2023-09-07 DIAGNOSIS — E1140 Type 2 diabetes mellitus with diabetic neuropathy, unspecified: Secondary | ICD-10-CM | POA: Diagnosis not present

## 2023-09-07 DIAGNOSIS — M79675 Pain in left toe(s): Secondary | ICD-10-CM | POA: Diagnosis not present

## 2023-09-07 DIAGNOSIS — M79674 Pain in right toe(s): Secondary | ICD-10-CM

## 2023-09-07 DIAGNOSIS — I10 Essential (primary) hypertension: Secondary | ICD-10-CM

## 2023-09-07 DIAGNOSIS — B351 Tinea unguium: Secondary | ICD-10-CM | POA: Diagnosis not present

## 2023-09-07 NOTE — Telephone Encounter (Unsigned)
Copied from CRM [id number]. Topic: Clinical - Medication Refill >> Sep 07, 2023  9:27 AM Lennart Pall wrote: Most Recent Primary Care Visit:  Provider: Allegra Grana  Department: [facility]  Visit Type: OFFICE VISIT  Date: 06/28/2023  Medication: ***  Has the patient contacted their pharmacy? {yes/no:20286} (Agent: If no, request that the patient contact the pharmacy for the refill. If patient does not wish to contact the pharmacy document the reason why and proceed with request.) (Agent: If yes, when and what did the pharmacy advise?)  Is this the correct pharmacy for this prescription? {yes/no:20286} If no, delete pharmacy and type the correct one.  This is the patient's preferred pharmacy:  [facility] [address] [place] - [address] [place] [address] Phone: [phone] Fax: [phone]   Has the prescription been filled recently? {yes/no:20286}  Is the patient out of the medication? {yes/no:20286}  Has the patient been seen for an appointment in the last year OR does the patient have an upcoming appointment? {yes/no:20286}  Can we respond through MyChart? {yes/no:20286}  Agent: Please be advised that Rx refills may take up to 3 business days. We ask that you follow-up with your pharmacy.

## 2023-09-09 ENCOUNTER — Other Ambulatory Visit: Payer: Self-pay | Admitting: Family

## 2023-09-09 DIAGNOSIS — I10 Essential (primary) hypertension: Secondary | ICD-10-CM

## 2023-09-14 ENCOUNTER — Other Ambulatory Visit: Payer: Self-pay | Admitting: Family

## 2023-09-14 DIAGNOSIS — M545 Low back pain, unspecified: Secondary | ICD-10-CM

## 2023-09-17 NOTE — Progress Notes (Signed)
°  Subjective:  Patient ID: Cynthia [name], female    DOB: [birth year],  MRN: [id number]  77 y.o. female presents to clinic with  at risk foot care with history of diabetic neuropathy and painful elongated mycotic toenails 1-5 bilaterally which are tender when wearing enclosed shoe gear. Pain is relieved with periodic professional debridement.    New problem(s): None   PCP is Arnett, Lyn Records, FNP.  No Known Allergies  Review of Systems: Negative except as noted in the HPI.   Objective:  Cynthia [name] is a pleasant 77 y.o. female in NAD. AAO x 3.  Vascular Examination: Vascular status intact b/l with palpable pedal pulses. CFT <3 seconds b/l LE. Pedal hair sparse. No pain with calf compression b/l. Lower extremity skin temperature gradient within normal limits. No edema noted b/l LE. No cyanosis or clubbing noted b/l LE.  Neurological Examination: Sensation grossly intact b/l with 10 gram monofilament. Vibratory sensation intact b/l. Pt has subjective symptoms of neuropathy.  Dermatological Examination: Pedal skin with normal turgor, texture and tone b/l. Toenails 1-5 b/l thick, discolored, elongated with subungual debris and pain on dorsal palpation. No hyperkeratotic lesions noted b/l.   Musculoskeletal Examination: Muscle strength 5/5 to b/l LE. No pain, crepitus or joint limitation noted with ROM bilateral LE. No gross bony deformities bilaterally.  Radiographs: None  Last A1c:      Latest Ref Rng & Units 06/28/2023   11:01 AM 01/20/2023   10:12 AM  Hemoglobin A1C  Hemoglobin-A1c 4.0 - 5.6 % 6.5  6.9      Assessment:   1. Pain due to onychomycosis of toenails of both feet   2. Type 2 diabetes mellitus with diabetic neuropathy, without long-term current use of insulin (HCC)     Plan:  Patient was evaluated and treated. All patient's and/or POA's questions/concerns addressed on today's visit. Toenails 1-5 debrided in length and girth without incident. Continue soft,  supportive shoe gear daily. Report any pedal injuries to medical professional. Call office if there are any questions/concerns. -Continue foot and shoe inspections daily. Monitor blood glucose per PCP/Endocrinologist's recommendations. -Patient/POA to call should there be question/concern in the interim.  Return in about 3 months (around 12/06/2023).  Freddie Breech, DPM      [place] LOCATION: [address], [place] [address]                   Office [phone]   [facility] LOCATION: [address] [place], [place] [address] Office [phone]

## 2023-09-21 ENCOUNTER — Telehealth: Payer: Self-pay

## 2023-09-21 NOTE — Telephone Encounter (Signed)
 Returned VM. No answer or VM pick up.

## 2023-09-22 ENCOUNTER — Ambulatory Visit: Admission: RE | Admit: 2023-09-22 | Payer: Medicare Other | Source: Ambulatory Visit

## 2023-09-26 DIAGNOSIS — J449 Chronic obstructive pulmonary disease, unspecified: Secondary | ICD-10-CM | POA: Diagnosis not present

## 2023-09-26 DIAGNOSIS — E1122 Type 2 diabetes mellitus with diabetic chronic kidney disease: Secondary | ICD-10-CM | POA: Diagnosis not present

## 2023-09-26 DIAGNOSIS — E871 Hypo-osmolality and hyponatremia: Secondary | ICD-10-CM | POA: Diagnosis not present

## 2023-09-26 DIAGNOSIS — N1831 Chronic kidney disease, stage 3a: Secondary | ICD-10-CM | POA: Diagnosis not present

## 2023-09-26 DIAGNOSIS — R809 Proteinuria, unspecified: Secondary | ICD-10-CM | POA: Diagnosis not present

## 2023-09-26 DIAGNOSIS — K219 Gastro-esophageal reflux disease without esophagitis: Secondary | ICD-10-CM | POA: Diagnosis not present

## 2023-09-26 DIAGNOSIS — C642 Malignant neoplasm of left kidney, except renal pelvis: Secondary | ICD-10-CM | POA: Diagnosis not present

## 2023-09-26 DIAGNOSIS — I10 Essential (primary) hypertension: Secondary | ICD-10-CM | POA: Diagnosis not present

## 2023-09-28 ENCOUNTER — Ambulatory Visit (INDEPENDENT_AMBULATORY_CARE_PROVIDER_SITE_OTHER): Payer: Medicare Other | Admitting: Family

## 2023-09-28 ENCOUNTER — Encounter: Payer: Self-pay | Admitting: Family

## 2023-09-28 VITALS — BP 120/80 | HR 72 | Temp 97.9°F | Ht 68.0 in | Wt 214.8 lb

## 2023-09-28 DIAGNOSIS — E1122 Type 2 diabetes mellitus with diabetic chronic kidney disease: Secondary | ICD-10-CM | POA: Diagnosis not present

## 2023-09-28 DIAGNOSIS — I10 Essential (primary) hypertension: Secondary | ICD-10-CM | POA: Diagnosis not present

## 2023-09-28 DIAGNOSIS — E1140 Type 2 diabetes mellitus with diabetic neuropathy, unspecified: Secondary | ICD-10-CM

## 2023-09-28 DIAGNOSIS — Z794 Long term (current) use of insulin: Secondary | ICD-10-CM

## 2023-09-28 DIAGNOSIS — Z7984 Long term (current) use of oral hypoglycemic drugs: Secondary | ICD-10-CM | POA: Diagnosis not present

## 2023-09-28 LAB — BASIC METABOLIC PANEL
BUN: 10 mg/dL (ref 6–23)
CO2: 29 meq/L (ref 19–32)
Calcium: 9.6 mg/dL (ref 8.4–10.5)
Chloride: 99 meq/L (ref 96–112)
Creatinine, Ser: 0.63 mg/dL (ref 0.40–1.20)
GFR: 85.24 mL/min (ref >60.00)
Glucose, Bld: 126 mg/dL — ABNORMAL HIGH (ref 70–99)
Potassium: 3.6 meq/L (ref 3.5–5.1)
Sodium: 136 meq/L (ref 135–145)

## 2023-09-28 LAB — HEMOGLOBIN A1C: Hgb A1c MFr Bld: 7 % — ABNORMAL HIGH (ref 4.6–6.5)

## 2023-09-28 NOTE — Patient Instructions (Signed)
 Call Dr Elgie Collard in regards to cost of Anora inhaler    [phone]   Let me know how I can help too.

## 2023-09-28 NOTE — Progress Notes (Signed)
 Assessment & Plan:  Type 2 diabetes mellitus with chronic kidney disease, with long-term current use of insulin , unspecified CKD stage (HCC) -     Hemoglobin A1c  Primary hypertension Assessment & Plan: Chronic, stable.  continue amlodipine  to 5 mg QD, hydrochlorothiazide   25 mg QD, losartan  50 mg QD  Orders: -     Basic metabolic panel  Type 2 diabetes mellitus with diabetic neuropathy, without long-term current use of insulin  (HCC) Assessment & Plan: Lab Results  Component Value Date   HGBA1C 7.0 (H) 09/28/2023  Increased from prior.  Will advise patient via result note to increase metformin  to 1000mg  qam and 500mg  qpm      Return precautions given.   Risks, benefits, and alternatives of the medications and treatment plan prescribed today were discussed, and patient expressed understanding.   Education regarding symptom management and diagnosis given to patient on AVS either electronically or printed.  Return in about 3 months (around 12/27/2023).  Rollene Northern, FNP  Subjective:    Patient ID: Cynthia [name], female    DOB: 05/24/1946, 78 y.o.   MRN: [id number]  CC: Cynthia [name] is a 78 y.o. female who presents today for follow up.   HPI: Feels well today.  No new complaints   Follow-up with nephrology 2 days ago for CKD, proteinuria. No medication changes   Follow-up scheduled March with Dr Babara   CT lung cancer screening is scheduled for tomorrow.  MRI abdomen with and without contrast scheduled 10/04/2023  Allergies: Patient has no known allergies. Current Outpatient Medications on File Prior to Visit  Medication Sig Dispense Refill   albuterol  (VENTOLIN  HFA) 108 (90 Base) MCG/ACT inhaler TAKE 2 PUFFS BY MOUTH EVERY 6 HOURS AS NEEDED FOR WHEEZE OR SHORTNESS OF BREATH 8.5 each 2   amLODipine  (NORVASC ) 2.5 MG tablet TAKE 2 TABLETS BY MOUTH EVERY DAY 90 tablet 1   ANORO ELLIPTA  62.5-25 MCG/ACT AEPB TAKE 1 PUFF BY MOUTH EVERY DAY 60 each 11   aspirin  EC  81 MG tablet Take 81 mg by mouth daily. Swallow whole.     atorvastatin  (LIPITOR) 20 MG tablet TAKE 1 TABLET BY MOUTH EVERY DAY 90 tablet 3   blood glucose meter kit and supplies KIT Dispense based on patient and insurance preference. Use up to four times daily as directed. 1 each 0   Calcium  Carbonate-Vit D-Min (CALCIUM  600+D PLUS MINERALS) 600-400 MG-UNIT TABS Take 1 tablet by mouth daily.      diclofenac  sodium (VOLTAREN ) 1 % GEL Apply 1 application  topically 4 (four) times daily as needed (knee pain.).     DULoxetine  (CYMBALTA ) 60 MG capsule TAKE 1 CAPSULE BY MOUTH EVERY DAY 90 capsule 3   hydrochlorothiazide  (HYDRODIURIL ) 25 MG tablet Take 1 tablet (25 mg total) by mouth daily. 90 tablet 3   Lancet Devices (ONE TOUCH DELICA LANCING DEV) MISC 1 Device by Does not apply route daily. 1 each 2   Lancets (ONETOUCH DELICA PLUS LANCET30G) MISC USE AS INSTRUCTED 100 each 12   levothyroxine  (SYNTHROID ) 137 MCG tablet TAKE 1 TABLET BY MOUTH EVERY DAY BEFORE BREAKFAST 90 tablet 1   losartan  (COZAAR ) 50 MG tablet TAKE 1 TABLET BY MOUTH EVERY DAY 90 tablet 1   metFORMIN  (GLUCOPHAGE ) 500 MG tablet TAKE 1 TABLET BY MOUTH TWICE A DAY 180 tablet 1   metoprolol  tartrate (LOPRESSOR ) 25 MG tablet Take 1 tablet (25 mg total) by mouth 2 (two) times daily. 90 tablet 1   ONETOUCH  ULTRA test strip TEST BLOOD SUGAR 1-2 TIMES A DAY 100 strip 5   potassium chloride  SA (KLOR-CON  M) 20 MEQ tablet Take by mouth.     traMADol  (ULTRAM ) 50 MG tablet Take 1 tablet (50 mg total) by mouth every 12 (twelve) hours as needed for severe pain (pain score 7-10). 30 tablet 2   No current facility-administered medications on file prior to visit.    Review of Systems    Objective:    BP 120/80   Pulse 72   Temp 97.9 F (36.6 C) (Oral)   Ht 5' 8 (1.727 m)   Wt 214 lb 12.8 oz (97.4 kg)   SpO2 97%   BMI 32.66 kg/m  BP Readings from Last 3 Encounters:  09/28/23 120/80  06/28/23 130/80  06/02/23 118/71   Wt Readings from  Last 3 Encounters:  09/28/23 214 lb 12.8 oz (97.4 kg)  06/28/23 223 lb 9.6 oz (101.4 kg)  05/03/23 212 lb (96.2 kg)    Physical Exam

## 2023-09-28 NOTE — Assessment & Plan Note (Signed)
Chronic, stable.  continue amlodipine to 5 mg QD, hydrochlorothiazide  25 mg QD, losartan 50 mg QD

## 2023-09-28 NOTE — Assessment & Plan Note (Signed)
 Lab Results  Component Value Date   HGBA1C 7.0 (H) 09/28/2023  Increased from prior.  Will advise patient via result note to increase metformin to 1000mg  qam and 500mg  qpm

## 2023-09-29 ENCOUNTER — Telehealth: Payer: Self-pay

## 2023-09-29 ENCOUNTER — Telehealth: Payer: Self-pay | Admitting: *Deleted

## 2023-09-29 ENCOUNTER — Other Ambulatory Visit (HOSPITAL_COMMUNITY): Payer: Self-pay

## 2023-09-29 ENCOUNTER — Ambulatory Visit
Admission: RE | Admit: 2023-09-29 | Discharge: 2023-09-29 | Disposition: A | Discharge status: Home / Self Care | Payer: Medicare Other | Source: Ambulatory Visit | Attending: Acute Care | Admitting: Acute Care

## 2023-09-29 ENCOUNTER — Ambulatory Visit: Admission: RE | Admit: 2023-09-29 | Payer: Medicare Other | Source: Ambulatory Visit

## 2023-09-29 DIAGNOSIS — Z122 Encounter for screening for malignant neoplasm of respiratory organs: Secondary | ICD-10-CM | POA: Diagnosis not present

## 2023-09-29 DIAGNOSIS — J432 Centrilobular emphysema: Secondary | ICD-10-CM

## 2023-09-29 DIAGNOSIS — Z87891 Personal history of nicotine dependence: Secondary | ICD-10-CM | POA: Diagnosis not present

## 2023-09-29 MED ORDER — BEVESPI AEROSPHERE 9-4.8 MCG/ACT IN AERO: 2.0000 | INHALATION_SPRAY | Freq: Two times a day (BID) | RESPIRATORY_TRACT | 11 refills | Status: DC

## 2023-09-29 NOTE — Telephone Encounter (Signed)
 Test claim shows Bevespi alternative is $47.00

## 2023-09-29 NOTE — Telephone Encounter (Signed)
-----   Message from Rennie Plowman sent at 09/28/2023  4:58 PM EST ----- Call pt Increased from prior. Is she agreeable to increase metformin to 1000mg  qam and 500mg  qpm? She had been taking metformin 500mg  BID  Please send in new rx if agreeable.

## 2023-09-29 NOTE — Telephone Encounter (Signed)
 Patient came into the office today. She said she went to pick up her Anoro and is over $300. She can not afford that. I told her I will send a message back to the pharmacy team to have them find a cheaper alternative and I would let her know as soon as I get a response.   Pharmacy team please see what a cheaper alternative would be. Thank you!

## 2023-09-29 NOTE — Telephone Encounter (Signed)
Dr. Dgayli, please see below message and advise. Thanks 

## 2023-09-29 NOTE — Telephone Encounter (Signed)
 Copied from CRM [id number]. Topic: Clinical - Prescription Issue >> Sep 29, 2023 10:45 AM Cynthia [name] wrote: Reason for CRM:  pt called to speak with CMA regarding ANORO ELLIPTA  62.5-25 MCG/ACT AEPB  pt states she went to the pharmacy, they are charging $387. For the medication. She is unable to afford the medication. Wanted to know if there was any assistance with the medication. Or if the provider has any samples pt is requesting a callback [phone]

## 2023-09-29 NOTE — Telephone Encounter (Signed)
 LVM to call back  to inform pt that  glucose is   Increased from prior. Is she agreeable to increase metformin to 1000mg  qam and 500mg  qpm? She had been taking metformin 500mg  BID Please send in new rx if agreeable.

## 2023-10-02 ENCOUNTER — Telehealth: Payer: Self-pay | Admitting: Student in an Organized Health Care Education/Training Program

## 2023-10-02 NOTE — Telephone Encounter (Signed)
 Pt is aware the Eyvonne Left has been sent in nothing further needed

## 2023-10-02 NOTE — Telephone Encounter (Signed)
LVM to call back to office  

## 2023-10-02 NOTE — Telephone Encounter (Signed)
 Lm x1 for the patient.

## 2023-10-02 NOTE — Telephone Encounter (Signed)
 I spoke with the patient. She is aware that we sent in Bevespi to her pharmacy. She said she called the pharmacy and they told her they would not have it in until 5:00pm today. She will pick it up as soon as they get it in.  Nothing further needed.

## 2023-10-02 NOTE — Telephone Encounter (Signed)
 Patient calling answering service states she needs an inhaler that her insurance will cover. [facility] [address] [place]. Please call patient to advise once we know what the alternate inhaler will be.

## 2023-10-02 NOTE — Telephone Encounter (Signed)
 Dr Isadora,   Pt called stating Anoro is expensive for her  Do you suggest alternative?  I can also refer her to our pharmD team whom can be helpful with cost of medications ; let me know if you want me to arrange    Shahzaib Azevedo team- please let pt know we have reached out to Dr Isadora to discuss alternatives

## 2023-10-03 NOTE — Telephone Encounter (Signed)
 I spoke with the patient yesterday. We have sent in [place] in place of the Anoro. She is aware. Nothing further needed.

## 2023-10-04 ENCOUNTER — Ambulatory Visit
Admission: RE | Admit: 2023-10-04 | Discharge: 2023-10-04 | Disposition: A | Discharge status: Home / Self Care | Payer: Medicare Other | Source: Ambulatory Visit | Attending: Family | Admitting: Family

## 2023-10-04 ENCOUNTER — Telehealth: Payer: Self-pay

## 2023-10-04 ENCOUNTER — Telehealth: Payer: Self-pay | Admitting: Student in an Organized Health Care Education/Training Program

## 2023-10-04 DIAGNOSIS — C642 Malignant neoplasm of left kidney, except renal pelvis: Secondary | ICD-10-CM | POA: Insufficient documentation

## 2023-10-04 DIAGNOSIS — I700 Atherosclerosis of aorta: Secondary | ICD-10-CM | POA: Diagnosis not present

## 2023-10-04 DIAGNOSIS — N281 Cyst of kidney, acquired: Secondary | ICD-10-CM | POA: Diagnosis not present

## 2023-10-04 MED ORDER — GADOBUTROL 1 MMOL/ML IV SOLN
9.0000 mL | Freq: Once | INTRAVENOUS | Status: AC | PRN
  Administered 2023-10-04: 9 mL via INTRAVENOUS

## 2023-10-04 NOTE — Telephone Encounter (Signed)
LVM to call back to go over results 

## 2023-10-04 NOTE — Telephone Encounter (Signed)
-----   Message from Rennie Plowman sent at 09/28/2023  4:58 PM EST ----- Call pt Increased from prior. Is she agreeable to increase metformin to 1000mg  qam and 500mg  qpm? She had been taking metformin 500mg  BID  Please send in new rx if agreeable.
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
## 2023-10-04 NOTE — Telephone Encounter (Signed)
 Noted.

## 2023-10-05 ENCOUNTER — Other Ambulatory Visit: Payer: Self-pay

## 2023-10-05 MED ORDER — METFORMIN HCL 1000 MG PO TABS: 1000.0000 mg | ORAL_TABLET | Freq: Two times a day (BID) | ORAL | 3 refills | Status: DC

## 2023-10-10 ENCOUNTER — Other Ambulatory Visit: Payer: Self-pay | Admitting: Family

## 2023-10-10 DIAGNOSIS — I10 Essential (primary) hypertension: Secondary | ICD-10-CM

## 2023-10-17 DIAGNOSIS — R059 Cough, unspecified: Secondary | ICD-10-CM | POA: Diagnosis not present

## 2023-10-17 DIAGNOSIS — R0981 Nasal congestion: Secondary | ICD-10-CM | POA: Diagnosis not present

## 2023-10-17 DIAGNOSIS — B9689 Other specified bacterial agents as the cause of diseases classified elsewhere: Secondary | ICD-10-CM | POA: Diagnosis not present

## 2023-10-17 DIAGNOSIS — J208 Acute bronchitis due to other specified organisms: Secondary | ICD-10-CM | POA: Diagnosis not present

## 2023-10-18 ENCOUNTER — Other Ambulatory Visit: Payer: Self-pay

## 2023-10-18 ENCOUNTER — Ambulatory Visit: Payer: Self-pay | Admitting: Family

## 2023-10-18 ENCOUNTER — Inpatient Hospital Stay
Admission: EM | Admit: 2023-10-18 | Discharge: 2023-10-19 | DRG: 193 | Disposition: A | Discharge status: Home / Self Care | Payer: Medicare Other | Attending: Obstetrics and Gynecology | Admitting: Obstetrics and Gynecology

## 2023-10-18 ENCOUNTER — Emergency Department: Payer: Medicare Other

## 2023-10-18 DIAGNOSIS — J441 Chronic obstructive pulmonary disease with (acute) exacerbation: Secondary | ICD-10-CM | POA: Diagnosis not present

## 2023-10-18 DIAGNOSIS — E890 Postprocedural hypothyroidism: Secondary | ICD-10-CM | POA: Diagnosis present

## 2023-10-18 DIAGNOSIS — E785 Hyperlipidemia, unspecified: Secondary | ICD-10-CM | POA: Diagnosis present

## 2023-10-18 DIAGNOSIS — Z833 Family history of diabetes mellitus: Secondary | ICD-10-CM

## 2023-10-18 DIAGNOSIS — I2510 Atherosclerotic heart disease of native coronary artery without angina pectoris: Secondary | ICD-10-CM | POA: Diagnosis present

## 2023-10-18 DIAGNOSIS — I482 Chronic atrial fibrillation, unspecified: Secondary | ICD-10-CM | POA: Diagnosis present

## 2023-10-18 DIAGNOSIS — Z66 Do not resuscitate: Secondary | ICD-10-CM | POA: Diagnosis present

## 2023-10-18 DIAGNOSIS — I129 Hypertensive chronic kidney disease with stage 1 through stage 4 chronic kidney disease, or unspecified chronic kidney disease: Secondary | ICD-10-CM | POA: Diagnosis present

## 2023-10-18 DIAGNOSIS — J9601 Acute respiratory failure with hypoxia: Secondary | ICD-10-CM | POA: Diagnosis not present

## 2023-10-18 DIAGNOSIS — C642 Malignant neoplasm of left kidney, except renal pelvis: Secondary | ICD-10-CM | POA: Diagnosis present

## 2023-10-18 DIAGNOSIS — I4891 Unspecified atrial fibrillation: Secondary | ICD-10-CM | POA: Diagnosis not present

## 2023-10-18 DIAGNOSIS — Z7984 Long term (current) use of oral hypoglycemic drugs: Secondary | ICD-10-CM | POA: Diagnosis not present

## 2023-10-18 DIAGNOSIS — K219 Gastro-esophageal reflux disease without esophagitis: Secondary | ICD-10-CM | POA: Diagnosis present

## 2023-10-18 DIAGNOSIS — Z8349 Family history of other endocrine, nutritional and metabolic diseases: Secondary | ICD-10-CM | POA: Diagnosis not present

## 2023-10-18 DIAGNOSIS — J9621 Acute and chronic respiratory failure with hypoxia: Secondary | ICD-10-CM | POA: Diagnosis not present

## 2023-10-18 DIAGNOSIS — I10 Essential (primary) hypertension: Secondary | ICD-10-CM | POA: Diagnosis present

## 2023-10-18 DIAGNOSIS — J101 Influenza due to other identified influenza virus with other respiratory manifestations: Principal | ICD-10-CM | POA: Diagnosis present

## 2023-10-18 DIAGNOSIS — E1169 Type 2 diabetes mellitus with other specified complication: Secondary | ICD-10-CM | POA: Diagnosis not present

## 2023-10-18 DIAGNOSIS — E1140 Type 2 diabetes mellitus with diabetic neuropathy, unspecified: Secondary | ICD-10-CM | POA: Diagnosis present

## 2023-10-18 DIAGNOSIS — E1122 Type 2 diabetes mellitus with diabetic chronic kidney disease: Secondary | ICD-10-CM | POA: Diagnosis present

## 2023-10-18 DIAGNOSIS — R059 Cough, unspecified: Secondary | ICD-10-CM | POA: Diagnosis not present

## 2023-10-18 DIAGNOSIS — Z1152 Encounter for screening for COVID-19: Secondary | ICD-10-CM | POA: Diagnosis not present

## 2023-10-18 DIAGNOSIS — J439 Emphysema, unspecified: Secondary | ICD-10-CM | POA: Diagnosis present

## 2023-10-18 DIAGNOSIS — Z79890 Hormone replacement therapy (postmenopausal): Secondary | ICD-10-CM

## 2023-10-18 DIAGNOSIS — E039 Hypothyroidism, unspecified: Secondary | ICD-10-CM | POA: Diagnosis present

## 2023-10-18 DIAGNOSIS — N1831 Chronic kidney disease, stage 3a: Secondary | ICD-10-CM | POA: Diagnosis present

## 2023-10-18 DIAGNOSIS — Z79899 Other long term (current) drug therapy: Secondary | ICD-10-CM

## 2023-10-18 DIAGNOSIS — Z8249 Family history of ischemic heart disease and other diseases of the circulatory system: Secondary | ICD-10-CM | POA: Diagnosis not present

## 2023-10-18 DIAGNOSIS — Z0389 Encounter for observation for other suspected diseases and conditions ruled out: Secondary | ICD-10-CM | POA: Diagnosis not present

## 2023-10-18 LAB — RESP PANEL BY RT-PCR (RSV, FLU A&B, COVID)  RVPGX2
Influenza A by PCR: POSITIVE — AB
Influenza B by PCR: NEGATIVE
Resp Syncytial Virus by PCR: NEGATIVE
SARS Coronavirus 2 by RT PCR: NEGATIVE

## 2023-10-18 LAB — CBC WITH DIFFERENTIAL/PLATELET
Abs Immature Granulocytes: 0.01 10*3/uL (ref 0.00–0.07)
Basophils Absolute: 0 10*3/uL (ref 0.0–0.1)
Basophils Relative: 1 %
Eosinophils Absolute: 0.1 10*3/uL (ref 0.0–0.5)
Eosinophils Relative: 2 %
HCT: 40.7 % (ref 36.0–46.0)
Hemoglobin: 13.8 g/dL (ref 12.0–15.0)
Immature Granulocytes: 0 %
Lymphocytes Relative: 4 %
Lymphs Abs: 0.2 10*3/uL — ABNORMAL LOW (ref 0.7–4.0)
MCH: 30.9 pg (ref 26.0–34.0)
MCHC: 33.9 g/dL (ref 30.0–36.0)
MCV: 91.1 fL (ref 80.0–100.0)
Monocytes Absolute: 0.8 10*3/uL (ref 0.1–1.0)
Monocytes Relative: 15 %
Neutro Abs: 4 10*3/uL (ref 1.7–7.7)
Neutrophils Relative %: 78 %
Platelets: 280 10*3/uL (ref 150–400)
RBC: 4.47 MIL/uL (ref 3.87–5.11)
RDW: 13.4 % (ref 11.5–15.5)
WBC: 5.1 10*3/uL (ref 4.0–10.5)
nRBC: 0 % (ref 0.0–0.2)

## 2023-10-18 LAB — COMPREHENSIVE METABOLIC PANEL
ALT: 27 U/L (ref 0–44)
AST: 42 U/L — ABNORMAL HIGH (ref 15–41)
Albumin: 4.1 g/dL (ref 3.5–5.0)
Alkaline Phosphatase: 62 U/L (ref 38–126)
Anion gap: 11 (ref 5–15)
BUN: 9 mg/dL (ref 8–23)
CO2: 24 mmol/L (ref 22–32)
Calcium: 9.4 mg/dL (ref 8.9–10.3)
Chloride: 97 mmol/L — ABNORMAL LOW (ref 98–111)
Creatinine, Ser: 0.62 mg/dL (ref 0.44–1.00)
GFR, Estimated: >60 mL/min (ref >60)
Glucose, Bld: 167 mg/dL — ABNORMAL HIGH (ref 70–99)
Potassium: 3.4 mmol/L — ABNORMAL LOW (ref 3.5–5.1)
Sodium: 132 mmol/L — ABNORMAL LOW (ref 135–145)
Total Bilirubin: 0.9 mg/dL (ref 0.0–1.2)
Total Protein: 7.7 g/dL (ref 6.5–8.1)

## 2023-10-18 LAB — CBG MONITORING, ED
Glucose-Capillary: 226 mg/dL — ABNORMAL HIGH (ref 70–99)
Glucose-Capillary: 131 mg/dL — ABNORMAL HIGH (ref 70–99)

## 2023-10-18 LAB — TROPONIN I (HIGH SENSITIVITY)
Troponin I (High Sensitivity): 5 ng/L (ref <18)
Troponin I (High Sensitivity): 6 ng/L (ref <18)

## 2023-10-18 LAB — LACTIC ACID, PLASMA
Lactic Acid, Venous: 3 mmol/L (ref 0.5–1.9)
Lactic Acid, Venous: 2.3 mmol/L (ref 0.5–1.9)

## 2023-10-18 LAB — URINALYSIS, W/ REFLEX TO CULTURE (INFECTION SUSPECTED)
Bacteria, UA: NONE SEEN
Bilirubin Urine: NEGATIVE
Glucose, UA: 50 mg/dL — AB
Hgb urine dipstick: NEGATIVE
Ketones, ur: NEGATIVE mg/dL
Leukocytes,Ua: NEGATIVE
Nitrite: NEGATIVE
Protein, ur: NEGATIVE mg/dL
RBC / HPF: 0 RBC/hpf (ref 0–5)
Specific Gravity, Urine: 1.012 (ref 1.005–1.030)
pH: 5 (ref 5.0–8.0)

## 2023-10-18 LAB — BRAIN NATRIURETIC PEPTIDE: B Natriuretic Peptide: 43 pg/mL (ref 0.0–100.0)

## 2023-10-18 LAB — APTT: aPTT: 31 s (ref 24–36)

## 2023-10-18 LAB — PROTIME-INR
INR: 1.1 (ref 0.8–1.2)
Prothrombin Time: 14.2 s (ref 11.4–15.2)

## 2023-10-18 MED ORDER — MAGNESIUM SULFATE 2 GM/50ML IV SOLN: 2.0000 g | INTRAVENOUS | Status: DC

## 2023-10-18 MED ORDER — METOPROLOL TARTRATE 25 MG PO TABS
25.0000 mg | ORAL_TABLET | Freq: Two times a day (BID) | ORAL | Status: DC
  Administered 2023-10-18 – 2023-10-19 (×3): 25 mg via ORAL
  Filled 2023-10-18 (×3): qty 1

## 2023-10-18 MED ORDER — METHYLPREDNISOLONE SODIUM SUCC 40 MG IJ SOLR
40.0000 mg | Freq: Two times a day (BID) | INTRAMUSCULAR | Status: AC
  Administered 2023-10-18 – 2023-10-19 (×2): 40 mg via INTRAVENOUS
  Filled 2023-10-18 (×2): qty 1

## 2023-10-18 MED ORDER — ENOXAPARIN SODIUM 60 MG/0.6ML IJ SOSY
0.5000 mg/kg | PREFILLED_SYRINGE | INTRAMUSCULAR | Status: DC
  Administered 2023-10-18: 47.5 mg via SUBCUTANEOUS
  Filled 2023-10-18: qty 0.6

## 2023-10-18 MED ORDER — LACTATED RINGERS IV BOLUS (SEPSIS)
500.0000 mL | Freq: Once | INTRAVENOUS | Status: AC
  Administered 2023-10-18: 500 mL via INTRAVENOUS

## 2023-10-18 MED ORDER — ONDANSETRON HCL 4 MG/2ML IJ SOLN: 4.0000 mg | Freq: Four times a day (QID) | INTRAMUSCULAR | Status: DC | PRN

## 2023-10-18 MED ORDER — IPRATROPIUM-ALBUTEROL 0.5-2.5 (3) MG/3ML IN SOLN
3.0000 mL | Freq: Four times a day (QID) | RESPIRATORY_TRACT | Status: DC
  Administered 2023-10-18 – 2023-10-19 (×3): 3 mL via RESPIRATORY_TRACT
  Filled 2023-10-18 (×3): qty 3

## 2023-10-18 MED ORDER — ALBUTEROL SULFATE (2.5 MG/3ML) 0.083% IN NEBU
5.0000 mg | INHALATION_SOLUTION | Freq: Once | RESPIRATORY_TRACT | Status: AC
  Administered 2023-10-18: 5 mg via RESPIRATORY_TRACT
  Filled 2023-10-18: qty 6

## 2023-10-18 MED ORDER — ASPIRIN 81 MG PO TBEC
81.0000 mg | DELAYED_RELEASE_TABLET | Freq: Every day | ORAL | Status: DC
  Administered 2023-10-18 – 2023-10-19 (×2): 81 mg via ORAL
  Filled 2023-10-18 (×2): qty 1

## 2023-10-18 MED ORDER — AZITHROMYCIN 500 MG PO TABS: 500.0000 mg | ORAL_TABLET | Freq: Every day | ORAL | Status: DC

## 2023-10-18 MED ORDER — SODIUM CHLORIDE 0.9 % IV SOLN
500.0000 mg | INTRAVENOUS | Status: AC
  Administered 2023-10-18: 500 mg via INTRAVENOUS
  Filled 2023-10-18: qty 5

## 2023-10-18 MED ORDER — PREDNISONE 20 MG PO TABS: 40.0000 mg | ORAL_TABLET | Freq: Every day | ORAL | Status: DC

## 2023-10-18 MED ORDER — INSULIN ASPART 100 UNIT/ML IJ SOLN
0.0000 [IU] | Freq: Three times a day (TID) | INTRAMUSCULAR | Status: DC
  Administered 2023-10-18: 3 [IU] via SUBCUTANEOUS
  Administered 2023-10-19: 1 [IU] via SUBCUTANEOUS
  Filled 2023-10-18 (×2): qty 1

## 2023-10-18 MED ORDER — AMLODIPINE BESYLATE 5 MG PO TABS
5.0000 mg | ORAL_TABLET | Freq: Every day | ORAL | Status: DC
  Administered 2023-10-19: 5 mg via ORAL
  Filled 2023-10-18: qty 1

## 2023-10-18 MED ORDER — OSELTAMIVIR PHOSPHATE 75 MG PO CAPS
75.0000 mg | ORAL_CAPSULE | Freq: Two times a day (BID) | ORAL | Status: DC
  Administered 2023-10-18 – 2023-10-19 (×3): 75 mg via ORAL
  Filled 2023-10-18 (×3): qty 1

## 2023-10-18 MED ORDER — IPRATROPIUM-ALBUTEROL 0.5-2.5 (3) MG/3ML IN SOLN
3.0000 mL | Freq: Once | RESPIRATORY_TRACT | Status: AC
  Administered 2023-10-18: 3 mL via RESPIRATORY_TRACT
  Filled 2023-10-18: qty 3

## 2023-10-18 MED ORDER — METHYLPREDNISOLONE SODIUM SUCC 125 MG IJ SOLR
125.0000 mg | INTRAMUSCULAR | Status: AC
  Administered 2023-10-18: 125 mg via INTRAVENOUS
  Filled 2023-10-18: qty 2

## 2023-10-18 MED ORDER — ONDANSETRON HCL 4 MG PO TABS: 4.0000 mg | ORAL_TABLET | Freq: Four times a day (QID) | ORAL | Status: DC | PRN

## 2023-10-18 MED ORDER — ALBUTEROL SULFATE (2.5 MG/3ML) 0.083% IN NEBU: 2.5000 mg | INHALATION_SOLUTION | RESPIRATORY_TRACT | Status: DC | PRN

## 2023-10-18 MED ORDER — ENOXAPARIN SODIUM 40 MG/0.4ML IJ SOSY: 40.0000 mg | PREFILLED_SYRINGE | INTRAMUSCULAR | Status: DC

## 2023-10-18 MED ORDER — ACETAMINOPHEN 325 MG PO TABS
650.0000 mg | ORAL_TABLET | Freq: Once | ORAL | Status: AC
  Administered 2023-10-18: 650 mg via ORAL
  Filled 2023-10-18: qty 2

## 2023-10-18 NOTE — ED Provider Triage Note (Signed)
Emergency Medicine Provider Triage Evaluation Note  Cynthia [name] , a 78 y.o. female  was evaluated in triage.  Pt complains of cp/sob, new 02 requirement.  Review of Systems  Positive:  Negative:   Physical Exam  BP 129/87   Pulse (!) 128   Temp (!) 100.6 F (38.1 C) (Oral)   Resp (!) 22   Ht 5\' 8"  (1.727 m)   Wt 97.4 kg   SpO2 93%   BMI 32.65 kg/m  Gen:   Awake, no distress   Resp:  Increased MSK:   Moves extremities without difficulty  Other:    Medical Decision Making  Medically screening exam initiated at 9:52 AM.  Appropriate orders placed.  Angela Adam was informed that the remainder of the evaluation will be completed by another provider, this initial triage assessment does not replace that evaluation, and the importance of remaining in the ED until their evaluation is complete.  Sepsis protocol started due to tachycardia, low-grade temp, respiratory illness   Faythe Ghee, PA-C 10/18/23 [phone]

## 2023-10-18 NOTE — Assessment & Plan Note (Signed)
Cont synthroid

## 2023-10-18 NOTE — Assessment & Plan Note (Signed)
Blood sugars 160s SSI  Monitor sugars w/ steroid use  Follow

## 2023-10-18 NOTE — ED Notes (Signed)
Critical Result: Lactic 3.0  Mumma, MD aware

## 2023-10-18 NOTE — Progress Notes (Signed)
PHARMACIST - PHYSICIAN COMMUNICATION  CONCERNING:  Enoxaparin (Lovenox) for DVT Prophylaxis    RECOMMENDATION: Patient was prescribed enoxaprin 40mg  q24 hours for VTE prophylaxis.   Filed Weights   10/18/23 0949  Weight: 97.4 kg (214 lb 11.7 oz)    Body mass index is 32.65 kg/m.  Estimated Creatinine Clearance: 70.7 mL/min (by C-G formula based on SCr of 0.62 mg/dL).   Based on [facility] policy patient is candidate for enoxaparin 0.5mg /kg TBW SQ every 24 hours based on BMI being >30.  Patient is candidate for enoxaparin 30mg  every 24 hours based on CrCl <52ml/min or Weight <45kg  DESCRIPTION: Pharmacy has adjusted enoxaparin dose per [facility] policy.  Patient is now receiving enoxaparin 47.5 mg every 24 hours    Foye Deer, PharmD Clinical Pharmacist  10/18/2023 2:04 PM

## 2023-10-18 NOTE — ED Provider Notes (Signed)
[facility] Provider Note    Event Date/Time   First MD Initiated Contact with Patient 10/18/23 1125     (approximate)   History   Chief Complaint: Shortness of Breath   HPI  Cynthia [name] is a 78 y.o. female with a history of hypertension, diabetes, COPD who comes ED complaining of shortness of breath cough and fatigue, worsening for the past 2 days.  Denies fever or chills.  Saw her doctor yesterday, was started on amoxicillin, but has not been able to fill the prescription yet.         Physical Exam   Triage Vital Signs: ED Triage Vitals  Encounter Vitals Group     BP 10/18/23 0946 129/87     Systolic BP Percentile --      Diastolic BP Percentile --      Pulse Rate 10/18/23 0946 (!) 128     Resp 10/18/23 0946 (!) 22     Temp 10/18/23 0946 (!) 100.6 F (38.1 C)     Temp Source 10/18/23 0946 Oral     SpO2 10/18/23 0946 (!) 88 %     Weight 10/18/23 0949 214 lb 11.7 oz (97.4 kg)     Height 10/18/23 0949 5\' 8"  (1.727 m)     Head Circumference --      Peak Flow --      Pain Score 10/18/23 0948 8     Pain Loc --      Pain Education --      Exclude from Growth Chart --     Most recent vital signs: Vitals:   10/18/23 1130 10/18/23 1320  BP: 128/80 (!) 129/111  Pulse: (!) 101 (!) 116  Resp: (!) 35 (!) 30  Temp: 98.6 F (37 C)   SpO2: 95% 95%    General: Awake, no distress.  CV:  Good peripheral perfusion.  Tachycardia heart rate 105 Resp:  Tachypnea.  Diffuse expiratory wheezing and prolonged expiratory phase Abd:  No distention.  Soft nontender Other:  No lower extremity edema, moist oral mucosa , vigorous energy   ED Results / Procedures / Treatments   Labs (all labs ordered are listed, but only abnormal results are displayed) Labs Reviewed  RESP PANEL BY RT-PCR (RSV, FLU A&B, COVID)  RVPGX2 - Abnormal; Notable for the following components:      Result Value   Influenza A by PCR POSITIVE (*)    All other components  within normal limits  LACTIC ACID, PLASMA - Abnormal; Notable for the following components:   Lactic Acid, Venous 3.0 (*)    All other components within normal limits  LACTIC ACID, PLASMA - Abnormal; Notable for the following components:   Lactic Acid, Venous 2.3 (*)    All other components within normal limits  COMPREHENSIVE METABOLIC PANEL - Abnormal; Notable for the following components:   Sodium 132 (*)    Potassium 3.4 (*)    Chloride 97 (*)    Glucose, Bld 167 (*)    AST 42 (*)    All other components within normal limits  CBC WITH DIFFERENTIAL/PLATELET - Abnormal; Notable for the following components:   Lymphs Abs 0.2 (*)    All other components within normal limits  URINALYSIS, W/ REFLEX TO CULTURE (INFECTION SUSPECTED) - Abnormal; Notable for the following components:   Color, Urine YELLOW (*)    APPearance CLEAR (*)    Glucose, UA 50 (*)    All other components within normal limits  CULTURE, BLOOD (ROUTINE X 2)  CULTURE, BLOOD (ROUTINE X 2)  PROTIME-INR  APTT  BRAIN NATRIURETIC PEPTIDE  TROPONIN I (HIGH SENSITIVITY)  TROPONIN I (HIGH SENSITIVITY)     EKG Interpreted by me Atrial fibrillation, rate 123.  Normal axis, normal intervals.  Normal QRS ST segments and T waves   RADIOLOGY Chest x-ray interpreted by me, unremarkable.  Radiology report reviewed   PROCEDURES:  Procedures   MEDICATIONS ORDERED IN ED: Medications  azithromycin (ZITHROMAX) 500 mg in sodium chloride 0.9 % 250 mL IVPB (500 mg Intravenous New Bag/Given 10/18/23 1430)    Followed by  azithromycin (ZITHROMAX) tablet 500 mg (has no administration in time range)  methylPREDNISolone sodium succinate (SOLU-MEDROL) 40 mg/mL injection 40 mg (has no administration in time range)    Followed by  predniSONE (DELTASONE) tablet 40 mg (has no administration in time range)  albuterol (PROVENTIL) (2.5 MG/3ML) 0.083% nebulizer solution 2.5 mg (has no administration in time range)  ipratropium-albuterol  (DUONEB) 0.5-2.5 (3) MG/3ML nebulizer solution 3 mL (3 mLs Nebulization Given 10/18/23 1430)  oseltamivir (TAMIFLU) capsule 75 mg (75 mg Oral Given 10/18/23 1430)  ondansetron (ZOFRAN) tablet 4 mg (has no administration in time range)    Or  ondansetron (ZOFRAN) injection 4 mg (has no administration in time range)  enoxaparin (LOVENOX) injection 47.5 mg (has no administration in time range)  metoprolol tartrate (LOPRESSOR) tablet 25 mg (25 mg Oral Given 10/18/23 1430)  aspirin EC tablet 81 mg (81 mg Oral Given 10/18/23 1430)  amLODipine (NORVASC) tablet 5 mg (has no administration in time range)  magnesium sulfate IVPB 2 g 50 mL (has no administration in time range)  insulin aspart (novoLOG) injection 0-9 Units (has no administration in time range)  lactated ringers bolus 500 mL (0 mLs Intravenous Stopped 10/18/23 1323)  acetaminophen (TYLENOL) tablet 650 mg (650 mg Oral Given 10/18/23 1006)  methylPREDNISolone sodium succinate (SOLU-MEDROL) 125 mg/2 mL injection 125 mg (125 mg Intravenous Given 10/18/23 1212)  ipratropium-albuterol (DUONEB) 0.5-2.5 (3) MG/3ML nebulizer solution 3 mL (3 mLs Nebulization Given 10/18/23 1208)  albuterol (PROVENTIL) (2.5 MG/3ML) 0.083% nebulizer solution 5 mg (5 mg Nebulization Given 10/18/23 1208)     IMPRESSION / MDM / ASSESSMENT AND PLAN / ED COURSE  I reviewed the triage vital signs and the nursing notes.  DDx: Pneumonia, COPD exacerbation, pleural effusion, pulmonary edema, non-STEMI, influenza, COVID, RSV, anemia, sepsis  Patient's presentation is most consistent with acute presentation with potential threat to life or bodily function.  Patient presents with shortness of breath, cough, low-grade fever.  Mildly tachycardic in atrial fibrillation.  In triage was 88% on room air, stabilized with 2 L nasal cannula.  Chemistry panel overall unremarkable.  Viral swab positive for influenza A.  Will give IV fluids, Solu-Medrol, bronchodilators and  reassess.   ----------------------------------------- 1:44 PM on 10/18/2023 ----------------------------------------- Patient still with pronounced expiratory wheezing and prolonged expiratory phase, tachypnea with respiratory rate of 30.  Oxygen saturation still 88% on room air, normalized with 2 L nasal cannula.  Will need to admit for further management of COPD exacerbation, influenza, hypoxic respiratory failure.  ----------------------------------------- 2:06 PM on 10/18/2023 ----------------------------------------- Case discussed with hospitalist      FINAL CLINICAL IMPRESSION(S) / ED DIAGNOSES   Final diagnoses:  COPD exacerbation (HCC)  Influenza A  Acute respiratory failure with hypoxia (HCC)  Chronic atrial fibrillation (HCC)     Rx / DC Orders   ED Discharge Orders     None  Note:  This document was prepared using Dragon voice recognition software and may include unintentional dictation errors.   Sharman Cheek, MD 10/18/23 [phone]

## 2023-10-18 NOTE — Assessment & Plan Note (Signed)
COPD Exacerbation  Influenza A  Decompensated respiratory failure requiring 2L [place] in setting of COPD and influenza A  CXR WNL  IV solumedrol  Duonebs  IV azithromycin  Tamiflu  Supplemental O2 prn

## 2023-10-18 NOTE — Telephone Encounter (Signed)
She is in the ED

## 2023-10-18 NOTE — ED Triage Notes (Signed)
Pt here with SOB since Monday. Pt states she was cleaning and then began coughing and wheezing. Pt has a hx of COPD. Pt oxygen saturation low in triage, 88%. Pt endorses cp as well.

## 2023-10-18 NOTE — Assessment & Plan Note (Signed)
BP stable Titrate home regimen

## 2023-10-18 NOTE — Assessment & Plan Note (Signed)
PPI ?

## 2023-10-18 NOTE — H&P (Addendum)
History and Physical    Patient: Cynthia [name] WUJ:[id number] DOB: [birth year] DOA: 10/18/2023 DOS: the patient was seen and examined on 10/18/2023 PCP: Allegra Grana, FNP  Patient coming from: Home  Chief Complaint:  Chief Complaint  Patient presents with   Shortness of Breath   HPI: Cynthia [name] is a 78 y.o. female with medical history significant of COPD, t2DM, GERD, HTN, HLD, hypothyroidism presenting w/ acute resp failure w/ hypoxia, influenza A, COPD.  Patient reports sudden onset of malaise fatigue over the past 2 days.  Positive body aches and chills.  Baseline COPD.  No longer smoking.  Worsening cough and wheezing as well as sputum production.  Not on oxygen at home.  No chest pain.  No abdominal pain.  Positive decreased p.o. intake.  No reported diarrhea.  No focal hemiparesis or confusion.  Has had worsening symptoms despite home inhaler use. Presented to the ER Tmax 100.6, heart rate 100s, BP stable.  Satting 88% on room air, transition to 2 L nasal cannula to keep O2 sats greater than 93%.  White count 7.1, hemoglobin 13.8, platelets 281, lactate 3-2.3.  Urinalysis not indicative of infection.  Creatinine 0.62, glucose 167.  Influenza A positive today. CXR WNL.  Review of Systems: As mentioned in the history of present illness. All other systems reviewed and are negative. 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 denies   Diabetes mellitus without complication (HCC)    type 2   Family history of breast cancer    Family history of lymphoma    Family history of prostate cancer    GERD (gastroesophageal reflux disease)    Hyperlipidemia    Hypertension    Hypothyroidism    Lung nodule    Thyroid disease    Past Surgical History:  Procedure Laterality Date   BREAST BIOPSY Left 11/25/2015   stereo  neg    BREAST EXCISIONAL BIOPSY Left yrs ago   benign   COLONOSCOPY WITH PROPOFOL N/A 04/20/2017   Procedure: COLONOSCOPY WITH PROPOFOL;  Surgeon: Wyline Mood, MD;  Location: [facility] ENDOSCOPY;  Service: Endoscopy;  Laterality: N/A;   COLONOSCOPY WITH PROPOFOL N/A 01/08/2018   Procedure: COLONOSCOPY WITH PROPOFOL;  Surgeon: Wyline Mood, MD;  Location: [facility] ENDOSCOPY;  Service: Gastroenterology;  Laterality: N/A;   COLONOSCOPY WITH PROPOFOL N/A 08/10/2018   Procedure: COLONOSCOPY WITH PROPOFOL;  Surgeon: Wyline Mood, MD;  Location: [facility] ENDOSCOPY;  Service: Gastroenterology;  Laterality: N/A;   COLONOSCOPY WITH PROPOFOL N/A 02/08/2021   Procedure: COLONOSCOPY WITH PROPOFOL;  Surgeon: Wyline Mood, MD;  Location: [facility] ENDOSCOPY;  Service: Gastroenterology;  Laterality: N/A;   IR RADIOLOGIST EVAL & MGMT  04/08/2020   IR RADIOLOGIST EVAL & MGMT  05/26/2020   IR RADIOLOGIST EVAL & MGMT  09/29/2020   IR RADIOLOGIST EVAL & MGMT  03/10/2021   IR RADIOLOGIST EVAL & MGMT  12/10/2021   RADIOLOGY WITH ANESTHESIA N/A 04/29/2020   Procedure: CRYOABLATION;  Surgeon: Oley Balm, MD;  Location: [facility] ORS;  Service: Radiology;  Laterality: N/A;   THYROIDECTOMY     Dr. Anda Kraft   VAGINAL DELIVERY     4   Social History:  reports that she quit smoking about 10 years ago. Her smoking use included cigarettes. She started smoking about 58 years ago. She  has a 36 pack-year smoking history. She has quit using smokeless tobacco.  Her smokeless tobacco use included snuff and chew. She reports that she does not drink alcohol and does not use drugs.  No Known Allergies  Family History  Problem Relation Age of Onset   Hypertension Mother    Hypertension Father    Prostate cancer Father 61   Diabetes Sister    Thyroid disease Sister    Breast cancer Sister 44   Breast cancer Sister 67   Lymphoma Sister 55   Breast cancer Maternal Aunt    Breast cancer Maternal Aunt    Cancer Paternal Grandmother        unk type    Prior to  Admission medications   Medication Sig Start Date End Date Taking? Authorizing Provider  amoxicillin (AMOXIL) 500 MG capsule Take 500 mg by mouth 2 (two) times daily. 10/17/23  Yes [provider]  meloxicam (MOBIC) 7.5 MG tablet Take 7.5 mg by mouth daily as needed. 07/17/23  Yes [provider]  albuterol (VENTOLIN HFA) 108 (90 Base) MCG/ACT inhaler TAKE 2 PUFFS BY MOUTH EVERY 6 HOURS AS NEEDED FOR WHEEZE OR SHORTNESS OF BREATH 08/03/23   Raechel Chute, MD  amLODipine (NORVASC) 2.5 MG tablet TAKE 2 TABLETS BY MOUTH EVERY DAY 10/11/23   Allegra Grana, FNP  aspirin EC 81 MG tablet Take 81 mg by mouth daily. Swallow whole.    [provider]  atorvastatin (LIPITOR) 20 MG tablet TAKE 1 TABLET BY MOUTH EVERY DAY 04/07/23   Allegra Grana, FNP  blood glucose meter kit and supplies KIT Dispense based on patient and insurance preference. Use up to four times daily as directed. 05/17/21   Allegra Grana, FNP  Calcium Carbonate-Vit D-Min (CALCIUM 600+D PLUS MINERALS) 600-400 MG-UNIT TABS Take 1 tablet by mouth daily.     [provider]  diclofenac sodium (VOLTAREN) 1 % GEL Apply 1 application  topically 4 (four) times daily as needed (knee pain.).    [provider]  DULoxetine (CYMBALTA) 60 MG capsule TAKE 1 CAPSULE BY MOUTH EVERY DAY 09/14/23   Allegra Grana, FNP  Glycopyrrolate-Formoterol (BEVESPI AEROSPHERE) 9-4.8 MCG/ACT AERO Inhale 2 puffs into the lungs 2 (two) times daily. 09/29/23   Raechel Chute, MD  hydrochlorothiazide (HYDRODIURIL) 25 MG tablet Take 1 tablet (25 mg total) by mouth daily. 06/28/23   Allegra Grana, FNP  Lancet Devices (ONE TOUCH DELICA LANCING DEV) MISC 1 Device by Does not apply route daily. 11/16/22   Allegra Grana, FNP  Lancets [facility] DELICA PLUS [place]) MISC USE AS INSTRUCTED 11/30/22   Allegra Grana, FNP  levothyroxine (SYNTHROID) 137 MCG tablet TAKE 1 TABLET BY MOUTH EVERY DAY BEFORE BREAKFAST  08/23/23   Allegra Grana, FNP  losartan (COZAAR) 50 MG tablet TAKE 1 TABLET BY MOUTH EVERY DAY 09/11/23   Allegra Grana, FNP  metFORMIN (GLUCOPHAGE) 1000 MG tablet Take 1 tablet (1,000 mg total) by mouth 2 (two) times daily with a meal. 10/05/23   Arnett, Lyn Records, FNP  metoprolol tartrate (LOPRESSOR) 25 MG tablet Take 1 tablet (25 mg total) by mouth 2 (two) times daily. 04/17/19   Allegra Grana, FNP  ONETOUCH ULTRA test strip TEST BLOOD SUGAR 1-2 TIMES A DAY 07/22/22   Allegra Grana, FNP  potassium chloride SA (KLOR-CON M) 20 MEQ tablet Take by mouth. 11/18/22 11/18/23  [provider]  traMADol (ULTRAM) 50 MG tablet Take 1 tablet (50  mg total) by mouth every 12 (twelve) hours as needed for severe pain (pain score 7-10). 08/01/23   Allegra Grana, FNP    Physical Exam: Vitals:   10/18/23 0947 10/18/23 0949 10/18/23 1130 10/18/23 1320  BP:   128/80 (!) 129/111  Pulse:   (!) 101 (!) 116  Resp:   (!) 35 (!) 30  Temp:   98.6 F (37 C)   TempSrc:   Oral   SpO2: 93%  95% 95%  Weight:  97.4 kg    Height:  5\' 8"  (1.727 m)     Physical Exam Constitutional:      Appearance: She is normal weight.  HENT:     Head: Normocephalic and atraumatic.     Nose: Nose normal.     Mouth/Throat:     Mouth: Mucous membranes are moist.  Eyes:     Pupils: Pupils are equal, round, and reactive to light.  Cardiovascular:     Rate and Rhythm: Normal rate and regular rhythm.  Pulmonary:     Breath sounds: Wheezing present.  Abdominal:     General: Bowel sounds are normal.  Musculoskeletal:        General: Normal range of motion.  Skin:    General: Skin is warm.  Neurological:     General: No focal deficit present.  Psychiatric:        Mood and Affect: Mood normal.     Data Reviewed:  There are no new results to review at this time.  DG Chest Port 1 View CLINICAL DATA:  Questionable sepsis - evaluate for abnormality. Shortness of breath. Cough.  EXAM: PORTABLE  CHEST 1 VIEW  COMPARISON:  06/06/2022.  CT 09/29/2023  FINDINGS: Heart and mediastinal contours are within normal limits. No focal opacities or effusions. No acute bony abnormality.  IMPRESSION: No active disease.  Electronically Signed   By: Charlett Nose M.D.   On: 10/18/2023 10:42  Lab Results  Component Value Date   WBC 5.1 10/18/2023   HGB 13.8 10/18/2023   HCT 40.7 10/18/2023   MCV 91.1 10/18/2023   PLT 280 10/18/2023   Last metabolic panel Lab Results  Component Value Date   GLUCOSE 167 (H) 10/18/2023   NA 132 (L) 10/18/2023   K 3.4 (L) 10/18/2023   CL 97 (L) 10/18/2023   CO2 24 10/18/2023   BUN 9 10/18/2023   CREATININE 0.62 10/18/2023   GFRNONAA >60 10/18/2023   CALCIUM 9.4 10/18/2023   PROT 7.7 10/18/2023   ALBUMIN 4.1 10/18/2023   BILITOT 0.9 10/18/2023   ALKPHOS 62 10/18/2023   AST 42 (H) 10/18/2023   ALT 27 10/18/2023   ANIONGAP 11 10/18/2023    Assessment and Plan: * Acute respiratory failure with hypoxia (HCC) COPD Exacerbation  Influenza A  Decompensated respiratory failure requiring 2L [place] in setting of COPD and influenza A  CXR WNL  IV solumedrol  Duonebs  IV azithromycin  Tamiflu  Supplemental O2 prn   Type 2 diabetes mellitus with diabetic neuropathy, unspecified (HCC) Blood sugars 160s SSI  Monitor sugars w/ steroid use  Follow    GERD (gastroesophageal reflux disease) PPI   Hypothyroidism Cont synthroid    Hypertension BP stable  Titrate home regimen        Advance Care Planning:   Code Status: Limited: Do not attempt resuscitation (DNR) -DNR-LIMITED -Do Not Intubate/DNI    Consults: None   Family Communication: No family at the bedside   Severity of Illness: The appropriate  patient status for this patient is INPATIENT. Inpatient status is judged to be reasonable and necessary in order to provide the required intensity of service to ensure the patient's safety. The patient's presenting symptoms, physical exam  findings, and initial radiographic and laboratory data in the context of their chronic comorbidities is felt to place them at high risk for further clinical deterioration. Furthermore, it is not anticipated that the patient will be medically stable for discharge from the hospital within 2 midnights of admission.   * I certify that at the point of admission it is my clinical judgment that the patient will require inpatient hospital care spanning beyond 2 midnights from the point of admission due to high intensity of service, high risk for further deterioration and high frequency of surveillance required.*  Author: Floydene Flock, MD 10/18/2023 2:32 PM  For on call review [email or web address].

## 2023-10-18 NOTE — Telephone Encounter (Signed)
Copied from CRM [id number]. Topic: Clinical - Red Word Triage >> Oct 18, 2023  8:53 AM Theodis Sato wrote: Red Word that prompted transfer to Nurse Triage: Patient calling stating she wants to schedule a breathing treatment with her primary care provider and states she can hardly breathe.  Chief Complaint: difficulty breathing Symptoms: wheezing, cough, chest tightness, difficulty talking  Frequency: constant Pertinent Negatives: Patient denies fever Disposition: [x] ED /[] Urgent Care (no appt availability in office) / [] Appointment(In office/virtual)/ []  [facility]/ [] Home Care/ [] Refused Recommended Disposition /[] [facility]/ []  Follow-up with PCP Additional Notes: Patient on phone, having difficulty time talking due to sob, audible wheezing heard.  Patient wants apt with pcp for today, no apt available.  Instructed to go to ER.  Patient states she is not sitting in an er all day, just wants a breathing treatment.  Patient hung up prior to care advice give.   Reason for Disposition  [1] MODERATE difficulty breathing (e.g., speaks in phrases, SOB even at rest, pulse 100-120) AND [2] NEW-onset or WORSE than normal  Answer Assessment - Initial Assessment Questions 1. RESPIRATORY STATUS: "Describe your breathing?" (e.g., wheezing, shortness of breath, unable to speak, severe coughing)      Wheezing and sob, didn't "hardly sleep last night" 2. ONSET: "When did this breathing problem begin?"      States diagnosed with bronchitis.  3. PATTERN "Does the difficult breathing come and go, or has it been constant since it started?"      Constant, and chest is sore 4. SEVERITY: "How bad is your breathing?" (e.g., mild, moderate, severe)    - MILD: No SOB at rest, mild SOB with walking, speaks normally in sentences, can lie down, no retractions, pulse < 100.    - MODERATE: SOB at rest, SOB with minimal exertion and prefers to sit, cannot lie down flat, speaks in phrases, mild  retractions, audible wheezing, pulse 100-120.    - SEVERE: Very SOB at rest, speaks in single words, struggling to breathe, sitting hunched forward, retractions, pulse > 120      moderate 5. RECURRENT SYMPTOM: "Have you had difficulty breathing before?" If Yes, ask: "When was the last time?" and "What happened that time?"      *No Answer* 6. CARDIAC HISTORY: "Do you have any history of heart disease?" (e.g., heart attack, angina, bypass surgery, angioplasty)      *No Answer* 7. LUNG HISTORY: "Do you have any history of lung disease?"  (e.g., pulmonary embolus, asthma, emphysema)     *No Answer* 8. CAUSE: "What do you think is causing the breathing problem?"      *No Answer* 9. OTHER SYMPTOMS: "Do you have any other symptoms? (e.g., dizziness, runny nose, cough, chest pain, fever)     *No Answer* 10. O2 SATURATION MONITOR:  "Do you use an oxygen saturation monitor (pulse oximeter) at home?" If Yes, ask: "What is your reading (oxygen level) today?" "What is your usual oxygen saturation reading?" (e.g., 95%)       *No Answer* 11. PREGNANCY: "Is there any chance you are pregnant?" "When was your last menstrual period?"       *No Answer* 12. TRAVEL: "Have you traveled out of the country in the last month?" (e.g., travel history, exposures)       *No Answer*  Protocols used: Breathing Difficulty-A-AH

## 2023-10-19 DIAGNOSIS — J9601 Acute respiratory failure with hypoxia: Secondary | ICD-10-CM | POA: Diagnosis not present

## 2023-10-19 LAB — COMPREHENSIVE METABOLIC PANEL
ALT: 28 U/L (ref 0–44)
AST: 50 U/L — ABNORMAL HIGH (ref 15–41)
Albumin: 3.6 g/dL (ref 3.5–5.0)
Alkaline Phosphatase: 55 U/L (ref 38–126)
Anion gap: 13 (ref 5–15)
BUN: 11 mg/dL (ref 8–23)
CO2: 24 mmol/L (ref 22–32)
Calcium: 9 mg/dL (ref 8.9–10.3)
Chloride: 101 mmol/L (ref 98–111)
Creatinine, Ser: 0.69 mg/dL (ref 0.44–1.00)
GFR, Estimated: >60 mL/min (ref >60)
Glucose, Bld: 149 mg/dL — ABNORMAL HIGH (ref 70–99)
Potassium: 3.4 mmol/L — ABNORMAL LOW (ref 3.5–5.1)
Sodium: 138 mmol/L (ref 135–145)
Total Bilirubin: 0.6 mg/dL (ref 0.0–1.2)
Total Protein: 7.1 g/dL (ref 6.5–8.1)

## 2023-10-19 LAB — CBC
HCT: 43.6 % (ref 36.0–46.0)
Hemoglobin: 14.7 g/dL (ref 12.0–15.0)
MCH: 31.1 pg (ref 26.0–34.0)
MCHC: 33.7 g/dL (ref 30.0–36.0)
MCV: 92.4 fL (ref 80.0–100.0)
Platelets: 271 10*3/uL (ref 150–400)
RBC: 4.72 MIL/uL (ref 3.87–5.11)
RDW: 13.4 % (ref 11.5–15.5)
WBC: 3.4 10*3/uL — ABNORMAL LOW (ref 4.0–10.5)
nRBC: 0 % (ref 0.0–0.2)

## 2023-10-19 LAB — CBG MONITORING, ED: Glucose-Capillary: 191 mg/dL — ABNORMAL HIGH (ref 70–99)

## 2023-10-19 LAB — MAGNESIUM: Magnesium: 1.5 mg/dL — ABNORMAL LOW (ref 1.7–2.4)

## 2023-10-19 LAB — TSH: TSH: 0.164 u[IU]/mL — ABNORMAL LOW (ref 0.350–4.500)

## 2023-10-19 MED ORDER — OSELTAMIVIR PHOSPHATE 75 MG PO CAPS: 75.0000 mg | ORAL_CAPSULE | Freq: Two times a day (BID) | ORAL | 0 refills | Status: DC

## 2023-10-19 MED ORDER — POTASSIUM CHLORIDE CRYS ER 20 MEQ PO TBCR
60.0000 meq | EXTENDED_RELEASE_TABLET | Freq: Once | ORAL | Status: AC
  Administered 2023-10-19: 60 meq via ORAL
  Filled 2023-10-19: qty 3

## 2023-10-19 MED ORDER — MAGNESIUM SULFATE 2 GM/50ML IV SOLN: 2.0000 g | Freq: Once | INTRAVENOUS | Status: DC

## 2023-10-19 MED ORDER — PREDNISONE 10 MG PO TABS: 10.0000 mg | ORAL_TABLET | Freq: Every day | ORAL | 0 refills | Status: AC

## 2023-10-19 MED ORDER — LEVOTHYROXINE SODIUM 112 MCG PO TABS: 112.0000 ug | ORAL_TABLET | Freq: Every day | ORAL | 11 refills | Status: DC

## 2023-10-19 NOTE — Discharge Summary (Signed)
Cynthia [name] ZOX:[id number] DOB: 04-04-1946 DOA: 10/18/2023  PCP: Allegra Grana, FNP  Admit date: 10/18/2023 Discharge date: 10/19/2023  Time spent: 35 minutes  Recommendations for Outpatient Follow-up:  Pcp f/u  TFTs 4-6 wks Cardiology f/u    Discharge Diagnoses:  Principal Problem:   Acute respiratory failure with hypoxia (HCC) Active Problems:   Type 2 diabetes mellitus with diabetic neuropathy, unspecified (HCC)   Hypertension   Hypothyroidism   GERD (gastroesophageal reflux disease)   Renal carcinoma, left (HCC)   Emphysema lung (HCC)   Chronic kidney disease, stage 3a (HCC)   Discharge Condition: improved  Diet recommendation: heart healthy  Filed Weights   10/18/23 0949  Weight: 97.4 kg    History of present illness:  From admission h and p Cynthia [name] is a 78 y.o. female with medical history significant of COPD, t2DM, GERD, HTN, HLD, hypothyroidism presenting w/ acute resp failure w/ hypoxia, influenza A, COPD.  Patient reports sudden onset of malaise fatigue over the past 2 days.  Positive body aches and chills.  Baseline COPD.  No longer smoking.  Worsening cough and wheezing as well as sputum production.  Not on oxygen at home.  No chest pain.  No abdominal pain.  Positive decreased p.o. intake.  No reported diarrhea.  No focal hemiparesis or confusion.  Has had worsening symptoms despite home inhaler use.   Hospital Course:  Patient presents with several days cough, fever, body aches, and dyspnea. Found to be influenza a positive with mild hypoxia in the upper 80s. Septic by fever and tachycardia. Treated with IV fluids, tamiflu, and steroids. Weaned off oxygen and symptomatically feeling much better, ambulated satisfactorily with PT with no home health needs identified. Will discharge home to complete a course of tamiflu and corticosteroids. TSH found to be mildly low, advise decreasing levothyroxine from 137 to 112 daily and repeat TFTs in 4-6 weeks.  EKG here showed possible a fib but after reviewing it with cardiology the abnormal rhythm appears to be frequent PACs. Advise patient to push hydration and to hold her home diuretic until PCP f/u.   Procedures: none   Consultations: none  Discharge Exam: Vitals:   10/19/23 0600 10/19/23 0943  BP: 115/78 (!) 129/97  Pulse: (!) 112 100  Resp: (!) 28 18  Temp: 98.1 F (36.7 C) 98.3 F (36.8 C)  SpO2: 91% 95%    General: NAD Cardiovascular: mild tachycardia Respiratory: clear  Discharge Instructions   Discharge Instructions     Diet - low sodium heart healthy   Complete by: As directed    Increase activity slowly   Complete by: As directed       Allergies as of 10/19/2023   No Known Allergies      Medication List     STOP taking these medications    amoxicillin 500 MG capsule Commonly known as: AMOXIL   hydrochlorothiazide 25 MG tablet Commonly known as: HYDRODIURIL       TAKE these medications    albuterol 108 (90 Base) MCG/ACT inhaler Commonly known as: VENTOLIN HFA TAKE 2 PUFFS BY MOUTH EVERY 6 HOURS AS NEEDED FOR WHEEZE OR SHORTNESS OF BREATH   amLODipine 2.5 MG tablet Commonly known as: NORVASC TAKE 2 TABLETS BY MOUTH EVERY DAY   aspirin EC 81 MG tablet Take 81 mg by mouth daily. Swallow whole.   atorvastatin 20 MG tablet Commonly known as: LIPITOR TAKE 1 TABLET BY MOUTH EVERY DAY   Bevespi Aerosphere 9-4.8 MCG/ACT Aero  Generic drug: Glycopyrrolate-Formoterol Inhale 2 puffs into the lungs 2 (two) times daily.   blood glucose meter kit and supplies Kit Dispense based on patient and insurance preference. Use up to four times daily as directed.   Calcium 600+D Plus Minerals 600-400 MG-UNIT Tabs Take 1 tablet by mouth daily.   diclofenac sodium 1 % Gel Commonly known as: VOLTAREN Apply 1 application  topically 4 (four) times daily as needed (knee pain.).   DULoxetine 60 MG capsule Commonly known as: CYMBALTA TAKE 1 CAPSULE BY MOUTH  EVERY DAY   levothyroxine 112 MCG tablet Commonly known as: Synthroid Take 1 tablet (112 mcg total) by mouth daily. What changed:  medication strength how much to take how to take this when to take this additional instructions   losartan 50 MG tablet Commonly known as: COZAAR TAKE 1 TABLET BY MOUTH EVERY DAY   meloxicam 7.5 MG tablet Commonly known as: MOBIC Take 7.5 mg by mouth daily as needed.   metFORMIN 1000 MG tablet Commonly known as: GLUCOPHAGE Take 1 tablet (1,000 mg total) by mouth 2 (two) times daily with a meal. What changed:  how much to take additional instructions   metoprolol tartrate 25 MG tablet Commonly known as: LOPRESSOR Take 1 tablet (25 mg total) by mouth 2 (two) times daily.   ONE TOUCH DELICA LANCING DEV Misc 1 Device by Does not apply route daily.   OneTouch Delica Plus Lancet30G Misc USE AS INSTRUCTED   OneTouch Ultra test strip Generic drug: glucose blood TEST BLOOD SUGAR 1-2 TIMES A DAY   oseltamivir 75 MG capsule Commonly known as: TAMIFLU Take 1 capsule (75 mg total) by mouth 2 (two) times daily.   potassium chloride SA 20 MEQ tablet Commonly known as: KLOR-CON M Take by mouth.   predniSONE 10 MG tablet Commonly known as: DELTASONE Take 1 tablet (10 mg total) by mouth daily for 3 days.   traMADol 50 MG tablet Commonly known as: ULTRAM Take 1 tablet (50 mg total) by mouth every 12 (twelve) hours as needed for severe pain (pain score 7-10).       No Known Allergies  Follow-up Information     Allegra Grana, FNP. Schedule an appointment as soon as possible for a visit .   Specialty: Family Medicine Contact information: [address] [place]ure[place]s [address] [place] [address] [phone]                  The results of significant diagnostics from this hospitalization (including imaging, microbiology, ancillary and laboratory) are listed below for reference.    Significant Diagnostic Studies: DG Chest Port 1  View Result Date: 10/18/2023 CLINICAL DATA:  Questionable sepsis - evaluate for abnormality. Shortness of breath. Cough. EXAM: PORTABLE CHEST 1 VIEW COMPARISON:  06/06/2022.  CT 09/29/2023 FINDINGS: Heart and mediastinal contours are within normal limits. No focal opacities or effusions. No acute bony abnormality. IMPRESSION: No active disease. Electronically Signed   By: Charlett Nose M.D.   On: 10/18/2023 10:42   MR Abdomen W Wo Contrast Result Date: 10/11/2023 CLINICAL DATA:  Follow-up left renal cell carcinoma status post cryoablation EXAM: MRI ABDOMEN WITHOUT AND WITH CONTRAST TECHNIQUE: Multiplanar multisequence MR imaging of the abdomen was performed both before and after the administration of intravenous contrast. CONTRAST:  9mL GADAVIST GADOBUTROL 1 MMOL/ML IV SOLN COMPARISON:  10/01/2022 FINDINGS: Lower chest: No acute abnormality. Hepatobiliary: No solid liver abnormality is seen. No gallstones, gallbladder wall thickening, or biliary dilatation. Pancreas: Unremarkable. No pancreatic ductal  dilatation or surrounding inflammatory changes. Spleen: Normal in size without significant abnormality. Adrenals/Urinary Tract: Adrenal glands are unremarkable. Unchanged cryoablation defect of the posterior lip of the midportion of the left kidney (series 4, image 19). No suspicious soft tissue or associated contrast enhancement to suggest residual or recurrent disease. Benign parapelvic right renal cysts, for which no further follow-up or characterization is required. No obvious calculi or hydronephrosis. Stomach/Bowel: Stomach is within normal limits. No evidence of bowel wall thickening, distention, or inflammatory changes. Vascular/Lymphatic: Aortic atherosclerosis. No enlarged abdominal lymph nodes. Other: No abdominal wall hernia or abnormality. No ascites. Musculoskeletal: No acute or significant osseous findings. IMPRESSION: 1. Unchanged cryoablation defect of the posterior lip of the midportion of the left  kidney. No suspicious soft tissue or associated contrast enhancement to suggest residual or recurrent disease. 2. No evidence of lymphadenopathy or metastatic disease in the abdomen. Aortic Atherosclerosis (ICD10-I70.0). Electronically Signed   By: Jearld Lesch M.D.   On: 10/11/2023 11:49   CT CHEST LUNG CA SCREEN LOW DOSE W/O CM Result Date: 10/09/2023 CLINICAL DATA:  Former 36 pack-year smoker, quit 2015. EXAM: CT CHEST WITHOUT CONTRAST LOW-DOSE FOR LUNG CANCER SCREENING TECHNIQUE: Multidetector CT imaging of the chest was performed following the standard protocol without IV contrast. RADIATION DOSE REDUCTION: This exam was performed according to the departmental dose-optimization program which includes automated exposure control, adjustment of the mA and/or kV according to patient size and/or use of iterative reconstruction technique. COMPARISON:  05/24/2022. FINDINGS: Cardiovascular: Atherosclerotic calcification of the aorta, aortic valve and coronary arteries. Heart is at the upper limits of normal in size to mildly enlarged. No pericardial effusion. Mediastinum/Nodes: No pathologically enlarged mediastinal or axillary lymph nodes. Hilar regions are difficult to definitively evaluate without IV contrast. Esophagus is grossly unremarkable. Lungs/Pleura: Centrilobular and paraseptal emphysema. Pulmonary nodules measure 10.8 mm or less in size, as before. No pleural fluid. Airway is unremarkable. Upper Abdomen: Visualized portions of the liver, adrenal glands, kidneys, spleen, pancreas, stomach and bowel are grossly unremarkable. No upper abdominal adenopathy. Musculoskeletal: Degenerative changes in the spine. IMPRESSION: 1. Lung-RADS 2, benign appearance or behavior. Continue annual screening with low-dose chest CT without contrast in 12 months. 2. Aortic atherosclerosis (ICD10-I70.0). Coronary artery calcification. 3.  Emphysema (ICD10-J43.9). Electronically Signed   By: Leanna Battles M.D.   On:  10/09/2023 09:42    Microbiology: Recent Results (from the past 240 hours)  Blood Culture (routine x 2)     Status: None (Preliminary result)   Collection Time: 10/18/23 10:01 AM   Specimen: BLOOD  Result Value Ref Range Status   Specimen Description BLOOD LEFT ANTECUBITAL  Final   Special Requests   Final    BOTTLES DRAWN AEROBIC AND ANAEROBIC Blood Culture results may not be optimal due to an inadequate volume of blood received in culture bottles   Culture   Final    NO GROWTH < 24 HOURS Performed at [facility], [address], [place], [place] [address]    Report Status PENDING  Incomplete  Resp panel by RT-PCR (RSV, Flu A&B, Covid) Anterior Nasal Swab     Status: Abnormal   Collection Time: 10/18/23 10:01 AM   Specimen: Anterior Nasal Swab  Result Value Ref Range Status   SARS Coronavirus 2 by RT PCR NEGATIVE NEGATIVE Final    Comment: (NOTE) SARS-CoV-2 target nucleic acids are NOT DETECTED.  The SARS-CoV-2 RNA is generally detectable in upper respiratory specimens during the acute phase of infection. The lowest concentration of  SARS-CoV-2 viral copies this assay can detect is 138 copies/mL. A negative result does not preclude SARS-Cov-2 infection and should not be used as the sole basis for treatment or other patient management decisions. A negative result may occur with  improper specimen collection/handling, submission of specimen other than nasopharyngeal swab, presence of viral mutation(s) within the areas targeted by this assay, and inadequate number of viral copies(<138 copies/mL). A negative result must be combined with clinical observations, patient history, and epidemiological information. The expected result is Negative.  Fact Sheet for Patients:  BloggerCourse.com  Fact Sheet for Healthcare Providers:  SeriousBroker.it  This test is no t yet approved or cleared by the [place] FDA and  has  been authorized for detection and/or diagnosis of SARS-CoV-2 by FDA under an Emergency Use Authorization (EUA). This EUA will remain  in effect (meaning this test can be used) for the duration of the COVID-19 declaration under Section 564(b)(1) of the Act, 21 U.S.C.section 360bbb-3(b)(1), unless the authorization is terminated  or revoked sooner.       Influenza A by PCR POSITIVE (A) NEGATIVE Final   Influenza B by PCR NEGATIVE NEGATIVE Final    Comment: (NOTE) The Xpert Xpress SARS-CoV-2/FLU/RSV plus assay is intended as an aid in the diagnosis of influenza from Nasopharyngeal swab specimens and should not be used as a sole basis for treatment. Nasal washings and aspirates are unacceptable for Xpert Xpress SARS-CoV-2/FLU/RSV testing.  Fact Sheet for Patients: BloggerCourse.com  Fact Sheet for Healthcare Providers: SeriousBroker.it  This test is not yet approved or cleared by the [place] FDA and has been authorized for detection and/or diagnosis of SARS-CoV-2 by FDA under an Emergency Use Authorization (EUA). This EUA will remain in effect (meaning this test can be used) for the duration of the COVID-19 declaration under Section 564(b)(1) of the Act, 21 U.S.C. section 360bbb-3(b)(1), unless the authorization is terminated or revoked.     Resp Syncytial Virus by PCR NEGATIVE NEGATIVE Final    Comment: (NOTE) Fact Sheet for Patients: BloggerCourse.com  Fact Sheet for Healthcare Providers: SeriousBroker.it  This test is not yet approved or cleared by the [place] FDA and has been authorized for detection and/or diagnosis of SARS-CoV-2 by FDA under an Emergency Use Authorization (EUA). This EUA will remain in effect (meaning this test can be used) for the duration of the COVID-19 declaration under Section 564(b)(1) of the Act, 21 U.S.C. section 360bbb-3(b)(1), unless  the authorization is terminated or revoked.  Performed at [facility], [address] [place], [place] [address]   Blood Culture (routine x 2)     Status: None (Preliminary result)   Collection Time: 10/18/23  2:43 PM   Specimen: BLOOD  Result Value Ref Range Status   Specimen Description BLOOD BLOOD RIGHT ARM  Final   Special Requests   Final    BOTTLES DRAWN AEROBIC AND ANAEROBIC Blood Culture results may not be optimal due to an inadequate volume of blood received in culture bottles   Culture   Final    NO GROWTH < 24 HOURS Performed at [facility], [address] [place], [place] [address]    Report Status PENDING  Incomplete     Labs: Basic Metabolic Panel: Recent Labs  Lab 10/18/23 1001 10/19/23 0454  NA 132* 138  K 3.4* 3.4*  CL 97* 101  CO2 24 24  GLUCOSE 167* 149*  BUN 9 11  CREATININE 0.62 0.69  CALCIUM 9.4 9.0  MG  --  1.5*   Liver Function Tests: Recent Labs  Lab 10/18/23 1001 10/19/23 0454  AST 42* 50*  ALT 27 28  ALKPHOS 62 55  BILITOT 0.9 0.6  PROT 7.7 7.1  ALBUMIN 4.1 3.6   No results for input(s): "LIPASE", "AMYLASE" in the last 168 hours. No results for input(s): "AMMONIA" in the last 168 hours. CBC: Recent Labs  Lab 10/18/23 1001 10/19/23 0454  WBC 5.1 3.4*  NEUTROABS 4.0  --   HGB 13.8 14.7  HCT 40.7 43.6  MCV 91.1 92.4  PLT 280 271   Cardiac Enzymes: No results for input(s): "CKTOTAL", "CKMB", "CKMBINDEX", "TROPONINI" in the last 168 hours. BNP: BNP (last 3 results) Recent Labs    10/18/23 1001  BNP 43.0    ProBNP (last 3 results) No results for input(s): "PROBNP" in the last 8760 hours.  CBG: Recent Labs  Lab 10/18/23 1710 10/18/23 2243  GLUCAP 226* 131*       Signed:  Silvano Bilis MD.  [facility] 10/19/2023, 11:08 AM

## 2023-10-19 NOTE — Evaluation (Signed)
Occupational Therapy Evaluation Patient Details Name: Cynthia [name] MRN: [id number] DOB: [birth year] Today's Date: 10/19/2023   History of Present Illness Pt is a 78 y.o. female admitted with acute respiratory failure with hypoxia & COPD exacerbation in the setting of Influenza A. PMH significant for  t2DM, GERD, HTN, HLD, hypothyroidism, acute resp failure w/ hypoxia, influenza A, COPD, CAD.   Clinical Impression   PTA, pt performs ADLs/IADLs/driving independent - mod independent (occasional use of SPC due to knee arthritis per pt). No falls history. Pt assists daughter with IADLs as needed. Pt pleasant, A&Ox4 with granddaughter present during OT eval. BUE strength grossly 5/5, good grip. Pt feels at baseline for ADLs and mobility, granddaughter in agreement. Pt performing supine to sit transition with use of bed features, simulated ADLs in room and short bout functional mobility with assist only for lines. No acute OT needs identified. OT will complete orders and sign off. Do not anticipate OT needs at hospital discharge.       If plan is discharge home, recommend the following: Assist for transportation;Assistance with cooking/housework    Functional Status Assessment  Patient has not had a recent decline in their functional status  Equipment Recommendations  None recommended by OT (has all necessary DME)       Precautions / Restrictions Precautions Precautions: Fall Restrictions Weight Bearing Restrictions Per Provider Order: No      Mobility Bed Mobility Overal bed mobility: Independent                  Transfers Overall transfer level: Modified independent Equipment used: None               General transfer comment: increased time      Balance Overall balance assessment: Modified Independent                                         ADL either performed or assessed with clinical judgement   ADL Overall ADL's : Modified independent                                        General ADL Comments: Pt endorses walking independently (staff assiting with lines) to hallway bathroom, no difficulties. Pt demos simulated ADLs from a standing/seated position. OT provides assist for lines as needed, no physical assist. Min vcs to cue for breathing techniques as HR in AFIB with increased RR.     Vision Baseline Vision/History: 1 Wears glasses Ability to See in Adequate Light: 0 Adequate Patient Visual Report: No change from baseline Vision Assessment?: Wears glasses for reading            Pertinent Vitals/Pain Pain Assessment Pain Assessment: No/denies pain     Extremity/Trunk Assessment Upper Extremity Assessment Upper Extremity Assessment: Overall WFL for tasks assessed;Right hand dominant (5/5 BUE grossly, good bilat grip strength)   Lower Extremity Assessment Lower Extremity Assessment: Overall WFL for tasks assessed   Cervical / Trunk Assessment Cervical / Trunk Assessment: Normal   Communication Communication Communication: No apparent difficulties   Cognition Arousal: Alert Behavior During Therapy: WFL for tasks assessed/performed Overall Cognitive Status: Within Functional Limits for tasks assessed  General Comments: A&Ox4, plesant and cooperative     General Comments  HR 108-145, avg 110-122, RR 22-35, SpO2% on RA 93%            Home Living Family/patient expects to be discharged to:: Private residence Living Arrangements: Children (son and daughter stay with her.) Available Help at Discharge: Family;Available 24 hours/day Type of Home: House Home Access: Ramped entrance     Home Layout: One level     Bathroom Shower/Tub: Chief Strategy Officer: Handicapped height Bathroom Accessibility: Yes How Accessible: Accessible via [place];Accessible via wheelchair Home Equipment: Rolling Schuchard (2 wheels);Cane - single point;Grab  bars - tub/shower;Grab bars - toilet;Hand held shower head;Wheelchair - manual          Prior Functioning/Environment Prior Level of Function : Independent/Modified Independent;Driving             Mobility Comments: SPC occassionlly due to knee arthritis, no falls hx ADLs Comments: independent with ADLs/IADLs. assists with daughter who is a wc user                OT Goals(Current goals can be found in the care plan section) Acute Rehab OT Goals Patient Stated Goal: to go home  OT Frequency:         AM-PAC OT "6 Clicks" Daily Activity     Outcome Measure Help from another person eating meals?: None Help from another person taking care of personal grooming?: None Help from another person toileting, which includes using toliet, bedpan, or urinal?: None Help from another person bathing (including washing, rinsing, drying)?: None Help from another person to put on and taking off regular upper body clothing?: None Help from another person to put on and taking off regular lower body clothing?: None 6 Click Score: 24   End of Session Nurse Communication: Mobility status  Activity Tolerance: Patient tolerated treatment well Patient left: in bed;with call bell/phone within reach;with nursing/sitter in room;with family/visitor present                   Time: 1610-9604 OT Time Calculation (min): 32 min Charges:  OT General Charges $OT Visit: 1 Visit OT Evaluation $OT Eval Low Complexity: 1 Low Natalie Leclaire L. Freemon Binford, OTR/L  10/19/23, 9:56 AM

## 2023-10-19 NOTE — Progress Notes (Signed)
PT Cancellation Note  Patient Details Name: CARAH BARRIENTES MRN: [id number] DOB: March 28, 1946   Cancelled Treatment:    Reason Eval/Treat Not Completed: Other (comment). Discussed with OT. Mobility is at baseline and no need for formal assessment. Will complete orders and sign off.   Lyndia Bury 10/19/2023, 10:37 AM Elizabeth Palau, PT, DPT, GCS [phone]

## 2023-10-20 ENCOUNTER — Telehealth: Payer: Self-pay

## 2023-10-20 NOTE — Transitions of Care (Post Inpatient/ED Visit) (Signed)
10/20/2023  Name: Cynthia [name] MRN: [id number] DOB: [birth year]  Today's TOC FU Call Status: Today's TOC FU Call Status:: Successful TOC FU Call Completed TOC FU Call Complete Date: 10/20/23 Patient's Name and Date of Birth confirmed.  Transition Care Management Follow-up Telephone Call Date of Discharge: 10/20/23 Discharge Facility: [facility]) Type of Discharge: Inpatient Admission Primary Inpatient Discharge Diagnosis:: Flu How have you been since you were released from the hospital?: Better Any questions or concerns?: No  Items Reviewed: Did you receive and understand the discharge instructions provided?: Yes Medications obtained,verified, and reconciled?: Yes (Medications Reviewed) Any new allergies since your discharge?: No Dietary orders reviewed?: NA Do you have support at home?: Yes People in Home: child(ren), adult Name of Support/Comfort Primary Source: Jasmine December  Medications Reviewed Today: Medications Reviewed Today     Reviewed by Redge Gainer, RN (Case Manager) on 10/20/23 at 1355  Med List Status: <None>   Medication Order Taking? Sig Documenting Provider Last Dose Status Informant  albuterol (VENTOLIN HFA) 108 (90 Base) MCG/ACT inhaler [id number] No TAKE 2 PUFFS BY MOUTH EVERY 6 HOURS AS NEEDED FOR WHEEZE OR SHORTNESS OF Ulyses Amor, MD Taking Active Pharmacy Records, Self  amLODipine (NORVASC) 2.5 MG tablet [id number] No TAKE 2 TABLETS BY MOUTH EVERY DAY Allegra Grana, FNP 10/17/2023 Active Pharmacy Records, Self  aspirin EC 81 MG tablet [id number] No Take 81 mg by mouth daily. Swallow whole. [provider] 10/17/2023 Active Self, Pharmacy Records  atorvastatin (LIPITOR) 20 MG tablet [id number] No TAKE 1 TABLET BY MOUTH EVERY DAY Allegra Grana, FNP 10/17/2023 Active Pharmacy Records, Self  blood glucose meter kit and supplies KIT [id number] No Dispense based on patient and insurance preference. Use up to  four times daily as directed. Allegra Grana, FNP Taking Active Pharmacy Records, Self  Calcium Carbonate-Vit D-Min (CALCIUM 600+D PLUS MINERALS) 600-400 MG-UNIT TABS [id number] No Take 1 tablet by mouth daily.  [provider] 10/17/2023 Active Self, Pharmacy Records  diclofenac sodium (VOLTAREN) 1 % GEL [id number] No Apply 1 application  topically 4 (four) times daily as needed (knee pain.). [provider] Taking Active Self, Pharmacy Records  DULoxetine (CYMBALTA) 60 MG capsule [id number] No TAKE 1 CAPSULE BY MOUTH EVERY DAY Allegra Grana, FNP 10/17/2023 Active Pharmacy Records, Self  Glycopyrrolate-Formoterol (BEVESPI AEROSPHERE) 9-4.8 MCG/ACT AERO [id number] No Inhale 2 puffs into the lungs 2 (two) times daily. Raechel Chute, MD 10/17/2023 Active Pharmacy Records, Self  Lancet Devices (ONE [facility] DELICA LANCING DEV) MISC [id number] No 1 Device by Does not apply route daily. Allegra Grana, FNP Taking Active Pharmacy Records, Self  Lancets [facility] PLUS [place]) MISC [id number] No USE AS INSTRUCTED Jason Coop Lyn Records, FNP Taking Active Pharmacy Records, Self  levothyroxine (SYNTHROID) 112 MCG tablet [id number]  Take 1 tablet (112 mcg total) by mouth daily. Kathrynn Running, MD  Active   losartan (COZAAR) 50 MG tablet [id number] No TAKE 1 TABLET BY MOUTH EVERY DAY Allegra Grana, FNP 10/17/2023 Active Pharmacy Records, Self  meloxicam (MOBIC) 7.5 MG tablet [id number]  Take 7.5 mg by mouth daily as needed. [provider]  Active Pharmacy Records, Self  metFORMIN (GLUCOPHAGE) 1000 MG tablet [id number] No Take 1 tablet (1,000 mg total) by mouth 2 (two) times daily with a meal.  Patient taking differently: Take 1,500 mg by mouth 2 (two) times daily with a meal. 1000 mg in the morning 500 mg in the evening   Allegra Grana, FNP  10/17/2023 Active Pharmacy Records, Self  metoprolol tartrate (LOPRESSOR) 25 MG tablet [id number] No Take 1 tablet (25 mg total) by  mouth 2 (two) times daily. Allegra Grana, FNP 10/17/2023 Active Self, Pharmacy Records  [facility] ULTRA test strip [id number] No TEST BLOOD SUGAR 1-2 TIMES A DAY Allegra Grana, FNP Taking Active Pharmacy Records, Self  oseltamivir (TAMIFLU) 75 MG capsule [id number]  Take 1 capsule (75 mg total) by mouth 2 (two) times daily. Wouk, Wilfred Curtis, MD  Active   potassium chloride SA (KLOR-CON M) 20 MEQ tablet [id number] No Take by mouth. [provider] 10/17/2023 Active Pharmacy Records, Self  predniSONE (DELTASONE) 10 MG tablet [id number]  Take 1 tablet (10 mg total) by mouth daily for 3 days. Wouk, Wilfred Curtis, MD  Active   traMADol (ULTRAM) 50 MG tablet [id number] No Take 1 tablet (50 mg total) by mouth every 12 (twelve) hours as needed for severe pain (pain score 7-10). Allegra Grana, FNP Taking Active Pharmacy Records, Self            Home Care and Equipment/Supplies: Were Home Health Services Ordered?: NA Any new equipment or medical supplies ordered?: NA  Functional Questionnaire: Do you need assistance with bathing/showering or dressing?: No Do you need assistance with meal preparation?: No Do you need assistance with eating?: No Do you have difficulty maintaining continence: No Do you need assistance with getting out of bed/getting out of a chair/moving?: No Do you have difficulty managing or taking your medications?: No  Follow up appointments reviewed: PCP Follow-up appointment confirmed?: Yes Date of PCP follow-up appointment?: 10/25/23 Follow-up Provider: Rosina Lowenstein Follow-up appointment confirmed?: NA Do you need transportation to your follow-up appointment?: No Do you understand care options if your condition(s) worsen?: Yes-patient verbalized understanding  SDOH Interventions Today    Flowsheet Row Most Recent Value  SDOH Interventions   Food Insecurity Interventions Intervention Not Indicated  Housing Interventions  Intervention Not Indicated  Transportation Interventions Intervention Not Indicated  Utilities Interventions Intervention Not Indicated  Social Connections Interventions Intervention Not Indicated      Interventions Today    Flowsheet Row Most Recent Value  Chronic Disease   Chronic disease during today's visit Chronic Obstructive Pulmonary Disease (COPD)  General Interventions   General Interventions Discussed/Reviewed General Interventions Discussed  Education Interventions   Education Provided Provided Education  Provided Verbal Education On Medication, When to see the doctor  Pharmacy Interventions   Pharmacy Dicussed/Reviewed Medications and their functions       Deidre Ala, BSN, RN [place]  [facility]BC[employer] - Population Health RN Care Manager [phone]

## 2023-10-23 LAB — CULTURE, BLOOD (ROUTINE X 2)
Culture: NO GROWTH
Culture: NO GROWTH

## 2023-10-25 ENCOUNTER — Ambulatory Visit: Payer: Medicare Other | Admitting: Family

## 2023-10-25 ENCOUNTER — Encounter: Payer: Self-pay | Admitting: Family

## 2023-10-25 VITALS — BP 128/84 | HR 78 | Temp 97.8°F | Ht 69.0 in | Wt 210.4 lb

## 2023-10-25 DIAGNOSIS — E039 Hypothyroidism, unspecified: Secondary | ICD-10-CM | POA: Diagnosis not present

## 2023-10-25 DIAGNOSIS — I10 Essential (primary) hypertension: Secondary | ICD-10-CM | POA: Diagnosis not present

## 2023-10-25 DIAGNOSIS — I700 Atherosclerosis of aorta: Secondary | ICD-10-CM | POA: Diagnosis not present

## 2023-10-25 DIAGNOSIS — J9601 Acute respiratory failure with hypoxia: Secondary | ICD-10-CM | POA: Diagnosis not present

## 2023-10-25 MED ORDER — DEXTROMETHORPHAN-GUAIFENESIN 5-100 MG/5ML PO LIQD: 10.0000 mL | Freq: Three times a day (TID) | ORAL | 0 refills | Status: DC | PRN

## 2023-10-25 NOTE — Progress Notes (Signed)
 Assessment & Plan:  Acute respiratory failure with hypoxia [facility]) Assessment & Plan: Hospitalization course reviewed.  Medications reconciled.  Symptom improvement.  Continued congestion and advised Mucinex  DM.  advised to continue follow-up with pulmonology  Orders: -     CBC with Differential/Platelet; Future -     Comprehensive metabolic panel; Future -     Dextromethorphan -guaiFENesin ; Take 10 mLs by mouth every 8 (eight) hours as needed (for cough).  Dispense: 118 mL; Refill: 0  Hypothyroidism, unspecified type -     TSH; Future  Primary hypertension Assessment & Plan: Chronic, stable.  continue amlodipine  to 5 mg QD, losartan  50 mg every day.  Of note, she is no longer on hydrochlorothiazide  25 mg daily after hospitalization   Atherosclerosis of aorta (HCC) Assessment & Plan: Chronic, stable.   Lab Results  Component Value Date   LDLCALC 64 01/20/2023   Reviewed recent CT chest.  Continue Lipitor 20 mg ASA 81mg  every day.       Return precautions given.   Risks, benefits, and alternatives of the medications and treatment plan prescribed today were discussed, and patient expressed understanding.   Education regarding symptom management and diagnosis given to patient on AVS either electronically or printed.  Return in about 4 months (around 02/22/2024).  Rollene Northern, FNP  Subjective:    Patient ID: Cynthia [name], female    DOB: 09/24/1945, 78 y.o.   MRN: [id number]  CC: Cynthia [name] is a 78 y.o. female who presents today for follow up.   HPI: Feels well today.    SOB has improved.  Occassional productive cough which she is interested in medication for.  Eating well.  No fever, nasal congestion, CP, left arm numbness, palpiations.    Compliant with albuterol  inhaler , Bevespi  as managed by pulmonology. She is no longer Anoro.   Compliant with reduced of synthroid  112mcg.   presents emergency room 10/18/2023 complaining of shortness of breath,  cough fatigue.  88% on room air.  Positive for influenza A.  Admitted for COPD exacerbation, influenza, respiratory failure, discharged 10/19/23  Hospital course significant for treatment with IV fluids, Tamiflu 's and steroids.  Weaned of off oxygen.  Completed course Tamiflu  and prednisone  10 mg daily x 3 as outpatient.  EKG thought possible atrial fibrillation without reviewed with cardiology and appear to be frequent PACs.  Holding hydrochlorothiazide .  former smoker  CXR 10/18/23 without active disease    A1c 7.0 History of atherosclerosis of aorta.  CT chest lung RADS 2.  Repeat 12 months.  Aortic atherosclerosis. She remains compliant with Lipitor 20 mg daily, asa 81mg .   Follow-up Dr. Babara 12/07/2023, renal cell carcinoma.  MRI abdomen with and without contrast 10/04/2023 with unchanged cryoablation defect left kidney.  No evidence of residual or recurrent disease.  No evidence of lymphadenopathy.   Allergies: Patient has no known allergies. Current Outpatient Medications on File Prior to Visit  Medication Sig Dispense Refill   albuterol  (VENTOLIN  HFA) 108 (90 Base) MCG/ACT inhaler TAKE 2 PUFFS BY MOUTH EVERY 6 HOURS AS NEEDED FOR WHEEZE OR SHORTNESS OF BREATH 8.5 each 2   amLODipine  (NORVASC ) 2.5 MG tablet TAKE 2 TABLETS BY MOUTH EVERY DAY 90 tablet 1   aspirin  EC 81 MG tablet Take 81 mg by mouth daily. Swallow whole.     atorvastatin  (LIPITOR) 20 MG tablet TAKE 1 TABLET BY MOUTH EVERY DAY 90 tablet 3   blood glucose meter kit and supplies KIT Dispense based on patient and  insurance preference. Use up to four times daily as directed. 1 each 0   Calcium  Carbonate-Vit D-Min (CALCIUM  600+D PLUS MINERALS) 600-400 MG-UNIT TABS Take 1 tablet by mouth daily.      DULoxetine  (CYMBALTA ) 60 MG capsule TAKE 1 CAPSULE BY MOUTH EVERY DAY 90 capsule 3   Glycopyrrolate-Formoterol  (BEVESPI  AEROSPHERE) 9-4.8 MCG/ACT AERO Inhale 2 puffs into the lungs 2 (two) times daily. 3 each 11   Lancet Devices  (ONE TOUCH DELICA LANCING DEV) MISC 1 Device by Does not apply route daily. 1 each 2   Lancets (ONETOUCH DELICA PLUS LANCET30G) MISC USE AS INSTRUCTED 100 each 12   levothyroxine  (SYNTHROID ) 112 MCG tablet Take 1 tablet (112 mcg total) by mouth daily. 30 tablet 11   losartan  (COZAAR ) 50 MG tablet TAKE 1 TABLET BY MOUTH EVERY DAY 90 tablet 1   metFORMIN  (GLUCOPHAGE ) 1000 MG tablet Take 1 tablet (1,000 mg total) by mouth 2 (two) times daily with a meal. (Patient taking differently: Take 1,500 mg by mouth 2 (two) times daily with a meal. 1000 mg in the morning 500 mg in the evening) 180 tablet 3   metoprolol  tartrate (LOPRESSOR ) 25 MG tablet Take 1 tablet (25 mg total) by mouth 2 (two) times daily. 90 tablet 1   ONETOUCH ULTRA test strip TEST BLOOD SUGAR 1-2 TIMES A DAY 100 strip 5   oseltamivir  (TAMIFLU ) 75 MG capsule Take 1 capsule (75 mg total) by mouth 2 (two) times daily. 7 capsule 0   potassium chloride  SA (KLOR-CON  M) 20 MEQ tablet Take by mouth.     traMADol  (ULTRAM ) 50 MG tablet Take 1 tablet (50 mg total) by mouth every 12 (twelve) hours as needed for severe pain (pain score 7-10). 30 tablet 2   diclofenac  sodium (VOLTAREN ) 1 % GEL Apply 1 application  topically 4 (four) times daily as needed (knee pain.). (Patient not taking: Reported on 10/25/2023)     No current facility-administered medications on file prior to visit.    Review of Systems  Constitutional:  Negative for chills and fever.  HENT:  Positive for congestion.   Respiratory:  Positive for cough and shortness of breath (improved).   Cardiovascular:  Negative for chest pain and palpitations.  Gastrointestinal:  Negative for nausea and vomiting.      Objective:    BP 128/84   Pulse 78   Temp 97.8 F (36.6 C) (Oral)   Ht 5' 9 (1.753 m)   Wt 210 lb 6.4 oz (95.4 kg)   SpO2 98%   BMI 31.07 kg/m  BP Readings from Last 3 Encounters:  10/25/23 128/84  10/19/23 (!) 129/97  09/28/23 120/80   Wt Readings from Last 3  Encounters:  10/25/23 210 lb 6.4 oz (95.4 kg)  10/18/23 214 lb 11.7 oz (97.4 kg)  09/28/23 214 lb 12.8 oz (97.4 kg)    Physical Exam Vitals reviewed.  Constitutional:      Appearance: She is well-developed.  Eyes:     Conjunctiva/sclera: Conjunctivae normal.  Cardiovascular:     Rate and Rhythm: Normal rate and regular rhythm.     Pulses: Normal pulses.     Heart sounds: Normal heart sounds.  Pulmonary:     Effort: Pulmonary effort is normal.     Breath sounds: Normal breath sounds. No wheezing, rhonchi or rales.  Skin:    General: Skin is warm and dry.  Neurological:     Mental Status: She is alert.  Psychiatric:  Speech: Speech normal.        Behavior: Behavior normal.        Thought Content: Thought content normal.

## 2023-10-25 NOTE — Patient Instructions (Signed)
 To further break up thick congestion, you may use Mucinex  DM.  Please stay hydrated drink plenty of water You may also use your albuterol  inhaler as needed for chest tightness, wheezing.  Nice to see you!

## 2023-10-26 NOTE — Assessment & Plan Note (Signed)
 Chronic, stable.   Lab Results  Component Value Date   LDLCALC 64 01/20/2023   Reviewed recent CT chest.  Continue Lipitor 20 mg ASA 81mg  every day.

## 2023-10-26 NOTE — Assessment & Plan Note (Signed)
 Hospitalization course reviewed.  Medications reconciled.  Symptom improvement.  Continued congestion and advised Mucinex  DM.  advised to continue follow-up with pulmonology

## 2023-10-26 NOTE — Assessment & Plan Note (Signed)
 Chronic, stable.  continue amlodipine  to 5 mg QD, losartan  50 mg every day.  Of note, she is no longer on hydrochlorothiazide  25 mg daily after hospitalization

## 2023-11-14 ENCOUNTER — Ambulatory Visit: Payer: Medicare Other | Admitting: Student in an Organized Health Care Education/Training Program

## 2023-11-14 ENCOUNTER — Encounter: Payer: Self-pay | Admitting: Student in an Organized Health Care Education/Training Program

## 2023-11-14 VITALS — BP 96/66 | HR 80 | Temp 97.6°F | Ht 69.0 in | Wt 217.4 lb

## 2023-11-14 DIAGNOSIS — Z87891 Personal history of nicotine dependence: Secondary | ICD-10-CM

## 2023-11-14 DIAGNOSIS — R052 Subacute cough: Secondary | ICD-10-CM | POA: Diagnosis not present

## 2023-11-14 DIAGNOSIS — J432 Centrilobular emphysema: Secondary | ICD-10-CM

## 2023-11-14 NOTE — Progress Notes (Signed)
 Assessment & Plan:   #Centrilobular emphysema #PRiSM #Former Smoker  Presents for posthospital discharge (with influenza) follow-up in the setting of known emphysema on CT as well as PFTs showing preserved ratio with impaired spirometry.  We have started her on long-acting bronchodilators with LAMA/LABA (currently on Bevespi) as well as albuterol which seems to be helping with her symptoms.  Physical exam today is reassuring and she does not have any wheezing.  COPD exacerbation was clearly triggered by influenza A infection which she has recovered from.  -Continue Bevespi 2 puffs twice daily -Continue albuterol 2 puffs every 6 hours as needed -Continue with LDCT for lung cancer screening  Return in about 6 months (around 05/13/2024).  I spent 30 minutes caring for this patient today, including preparing to see the patient, obtaining a medical history , reviewing a separately obtained history, performing a medically appropriate examination and/or evaluation, counseling and educating the patient/family/caregiver, ordering medications, tests, or procedures, documenting clinical information in the electronic health record, and independently interpreting results (not separately reported/billed) and communicating results to the patient/family/caregiver  Raechel Chute, MD [facility]   End of visit medications:  No orders of the defined types were placed in this encounter.    Current Outpatient Medications:    albuterol (VENTOLIN HFA) 108 (90 Base) MCG/ACT inhaler, TAKE 2 PUFFS BY MOUTH EVERY 6 HOURS AS NEEDED FOR WHEEZE OR SHORTNESS OF BREATH, Disp: 8.5 each, Rfl: 2   amLODipine (NORVASC) 2.5 MG tablet, TAKE 2 TABLETS BY MOUTH EVERY DAY, Disp: 90 tablet, Rfl: 1   aspirin EC 81 MG tablet, Take 81 mg by mouth daily. Swallow whole., Disp: , Rfl:    atorvastatin (LIPITOR) 20 MG tablet, TAKE 1 TABLET BY MOUTH EVERY DAY, Disp: 90 tablet, Rfl: 3   blood glucose meter kit  and supplies KIT, Dispense based on patient and insurance preference. Use up to four times daily as directed., Disp: 1 each, Rfl: 0   Calcium Carbonate-Vit D-Min (CALCIUM 600+D PLUS MINERALS) 600-400 MG-UNIT TABS, Take 1 tablet by mouth daily. , Disp: , Rfl:    diclofenac sodium (VOLTAREN) 1 % GEL, Apply 1 application  topically 4 (four) times daily as needed (knee pain.)., Disp: , Rfl:    DULoxetine (CYMBALTA) 60 MG capsule, TAKE 1 CAPSULE BY MOUTH EVERY DAY, Disp: 90 capsule, Rfl: 3   Glycopyrrolate-Formoterol (BEVESPI AEROSPHERE) 9-4.8 MCG/ACT AERO, Inhale 2 puffs into the lungs 2 (two) times daily., Disp: 3 each, Rfl: 11   Lancet Devices (ONE TOUCH DELICA LANCING DEV) MISC, 1 Device by Does not apply route daily., Disp: 1 each, Rfl: 2   Lancets (ONETOUCH DELICA PLUS LANCET30G) MISC, USE AS INSTRUCTED, Disp: 100 each, Rfl: 12   levothyroxine (SYNTHROID) 112 MCG tablet, Take 1 tablet (112 mcg total) by mouth daily., Disp: 30 tablet, Rfl: 11   losartan (COZAAR) 50 MG tablet, TAKE 1 TABLET BY MOUTH EVERY DAY, Disp: 90 tablet, Rfl: 1   metFORMIN (GLUCOPHAGE) 1000 MG tablet, Take 1 tablet (1,000 mg total) by mouth 2 (two) times daily with a meal. (Patient taking differently: Take 1,500 mg by mouth 2 (two) times daily with a meal. 1000 mg in the morning 500 mg in the evening), Disp: 180 tablet, Rfl: 3   metoprolol tartrate (LOPRESSOR) 25 MG tablet, Take 1 tablet (25 mg total) by mouth 2 (two) times daily., Disp: 90 tablet, Rfl: 1   ONETOUCH ULTRA test strip, TEST BLOOD SUGAR 1-2 TIMES A DAY, Disp: 100 strip, Rfl:  5   potassium chloride SA (KLOR-CON M) 20 MEQ tablet, Take by mouth., Disp: , Rfl:    traMADol (ULTRAM) 50 MG tablet, Take 1 tablet (50 mg total) by mouth every 12 (twelve) hours as needed for severe pain (pain score 7-10)., Disp: 30 tablet, Rfl: 2   Subjective:   PATIENT ID: Cynthia [name] GENDER: female DOB: 13-Nov-1945, MRN: [id number]  Chief Complaint  Patient presents with   Follow-up     No cough, shortness of breath or wheezing.     HPI  Patient is a pleasant 78 year old female presenting to clinic for posthospital discharge.  She has been followed in our clinic for preserved ratio impaired spirometry in the setting of history of smoking and is maintained on LAMA/LABA therapy.  Patient was recently admitted to the hospital late January 2025 with increased shortness of breath, cough, and wheezing.  She was noted to be influenza A positive on presentation to the hospital and required hospitalization for 1 day.  She was prescribed oseltamivir, antibiotics, and steroids in addition to nebulizers.  She felt significantly better and was discharged home where she reports continued improvement.  She is overall back to her usual state of health.  She denies any fevers, chills, night sweats, or chest pain or discomfort.  Today, she feels much better and is back to her usual state of health.  She is unsure why or how she contracted influenza.  She was previously maintained on Anoro Ellipta which we switched to [employer] secondary to insurance coverage.  She had previously had a COPD exacerbation in September 2023 requiring course of antibiotics.  She is a former smoker, having smoked for around 30 years but less than 1 pack/day.  She quit a long time ago.  She also worked in a [employer] for over 24 years.  Ancillary information including prior medications, full medical/surgical/family/social histories, and PFTs (when available) are listed below and have been reviewed.   Review of Systems  Constitutional:  Negative for chills, fever and weight loss.  Respiratory:  Negative for cough, sputum production, shortness of breath and wheezing.   Cardiovascular:  Negative for chest pain, leg swelling and PND.     Objective:   Vitals:   11/14/23 0950  BP: 96/66  Pulse: 80  Temp: 97.6 F (36.4 C)  TempSrc: Temporal  SpO2: 97%  Weight: 217 lb 6.4 oz (98.6 kg)  Height: 5\' 9"  (1.753 m)    97% on RA  BMI Readings from Last 3 Encounters:  11/14/23 32.10 kg/m  10/25/23 31.07 kg/m  10/18/23 32.65 kg/m   Wt Readings from Last 3 Encounters:  11/14/23 217 lb 6.4 oz (98.6 kg)  10/25/23 210 lb 6.4 oz (95.4 kg)  10/18/23 214 lb 11.7 oz (97.4 kg)    Physical Exam Constitutional:      Appearance: Normal appearance. She is not ill-appearing.  HENT:     Nose: Nose normal.     Mouth/Throat:     Mouth: Mucous membranes are moist.  Eyes:     Extraocular Movements: Extraocular movements intact.  Cardiovascular:     Rate and Rhythm: Normal rate and regular rhythm.     Pulses: Normal pulses.     Heart sounds: Normal heart sounds.  Pulmonary:     Effort: Pulmonary effort is normal.     Breath sounds: Normal breath sounds.  Abdominal:     Palpations: Abdomen is soft.  Musculoskeletal:        General: Normal range of motion.  Neurological:     General: No focal deficit present.     Mental Status: She is alert and oriented to person, place, and time. Mental status is at baseline.       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 denies   Diabetes mellitus without complication (HCC)    type 2   Family history of breast cancer    Family history of lymphoma    Family history of prostate cancer    GERD (gastroesophageal reflux disease)    Hyperlipidemia    Hypertension    Hypothyroidism    Lung nodule    Thyroid disease      Family History  Problem Relation Age of Onset   Hypertension Mother    Hypertension Father    Prostate cancer Father 73   Diabetes Sister    Thyroid disease Sister    Breast cancer Sister 59   Breast cancer Sister 26   Lymphoma Sister 78   Breast cancer Maternal Aunt    Breast cancer Maternal Aunt    Cancer Paternal Grandmother        unk type      Past Surgical History:  Procedure Laterality Date   BREAST BIOPSY Left 11/25/2015   stereo  neg   BREAST EXCISIONAL BIOPSY Left yrs ago   benign   COLONOSCOPY WITH PROPOFOL N/A 04/20/2017   Procedure: COLONOSCOPY WITH PROPOFOL;  Surgeon: Wyline Mood, MD;  Location[facility] ENDOSCOPY;  Service: Endoscopy;  Laterality: N/A;   COLONOSCOPY WITH PROPOFOL N/A 01/08/2018   Procedure: COLONOSCOPY WITH PROPOFOL;  Surgeon: Wyline Mood, MD;  Location: [facility] ENDOSCOPY;  Service: Gastroenterology;  Laterality: N/A;   COLONOSCOPY WITH PROPOFOL N/A 08/10/2018   Procedure: COLONOSCOPY WITH PROPOFOL;  Surgeon: Wyline Mood, MD;  Location: [facility] ENDOSCOPY;  Service: Gastroenterology;  Laterality: N/A;   COLONOSCOPY WITH PROPOFOL N/A 02/08/2021   Procedure: COLONOSCOPY WITH PROPOFOL;  Surgeon: Wyline Mood, MD;  Location: [facility] ENDOSCOPY;  Service: Gastroenterology;  Laterality: N/A;   IR RADIOLOGIST EVAL & MGMT  04/08/2020   IR RADIOLOGIST EVAL & MGMT  05/26/2020   IR RADIOLOGIST EVAL & MGMT  09/29/2020   IR RADIOLOGIST EVAL & MGMT  03/10/2021   IR RADIOLOGIST EVAL & MGMT  12/10/2021   RADIOLOGY WITH ANESTHESIA N/A 04/29/2020   Procedure: CRYOABLATION;  Surgeon: Oley Balm, MD;  Location: [facility] ORS;  Service: Radiology;  Laterality: N/A;   THYROIDECTOMY     Dr. Anda Kraft   VAGINAL DELIVERY     4    Social History   Socioeconomic History   Marital status: Widowed    Spouse name: Not on file   Number of children: 4   Years of education: Not on file   Highest education level: Not on file  Occupational History   Not on file  Tobacco Use   Smoking status: Former    Current packs/day: 0.00    Average packs/day: 0.8 packs/day for 48.0 years (36.0 ttl pk-yrs)    Types: Cigarettes    Start date: 61    Quit date: 2015    Years since quitting: 10.1   Smokeless tobacco: Former    Types: Snuff, Chew  Vaping Use   Vaping status: Never Used  Substance and Sexual Activity   Alcohol  use: No   Drug use: No    Sexual activity: Not Currently  Other Topics Concern   Not on file  Social History Narrative   Widow Has 4 children.      4 grandchildren.      Work - Retired from [employer]- walking the track, 4x per week      Diet- regular      Social Drivers of Corporate investment banker Strain: Low Risk  (05/03/2023)   Overall Financial Resource Strain (CARDIA)    Difficulty of Paying Living Expenses: Not hard at all  Food Insecurity: No Food Insecurity (10/20/2023)   Hunger Vital Sign    Worried About Running Out of Food in the Last Year: Never true    Ran Out of Food in the Last Year: Never true  Transportation Needs: No Transportation Needs (10/20/2023)   PRAPARE - Administrator, Civil Service (Medical): No    Lack of Transportation (Non-Medical): No  Physical Activity: Insufficiently Active (05/03/2023)   Exercise Vital Sign    Days of Exercise per Week: 3 days    Minutes of Exercise per Session: 30 min  Stress: No Stress Concern Present (05/03/2023)   [employer] of Occupational Health - Occupational Stress Questionnaire    Feeling of Stress : Not at all  Social Connections: Moderately Integrated (10/20/2023)   Social Connection and Isolation Panel [NHANES]    Frequency of Communication with Friends and Family: More than three times a week    Frequency of Social Gatherings with Friends and Family: More than three times a week    Attends Religious Services: 1 to 4 times per year    Active Member of [employer] or Organizations: No    Attends Banker Meetings: 1 to 4 times per year    Marital Status: Widowed  Intimate Partner Violence: Not At Risk (10/20/2023)   Humiliation, Afraid, Rape, and Kick questionnaire    Fear of Current or Ex-Partner: No    Emotionally Abused: No    Physically Abused: No    Sexually Abused: No     No Known Allergies   CBC    Component Value Date/Time   WBC 3.4 (L) 10/19/2023 0454   RBC 4.72 10/19/2023 0454    HGB 14.7 10/19/2023 0454   HCT 43.6 10/19/2023 0454   PLT 271 10/19/2023 0454   MCV 92.4 10/19/2023 0454   MCH 31.1 10/19/2023 0454   MCHC 33.7 10/19/2023 0454   RDW 13.4 10/19/2023 0454   LYMPHSABS 0.2 (L) 10/18/2023 1001   MONOABS 0.8 10/18/2023 1001   EOSABS 0.1 10/18/2023 1001   BASOSABS 0.0 10/18/2023 1001    Pulmonary Functions Testing Results:    Latest Ref Rng & Units 12/27/2022   10:22 AM  PFT Results  FVC-Pre L 2.61   FVC-Predicted Pre % 80   FVC-Post L 2.88   FVC-Predicted Post % 88   Pre FEV1/FVC % % 76   Post FEV1/FCV % % 79   FEV1-Pre L 1.98   FEV1-Predicted Pre % 81   FEV1-Post L 2.27   DLCO uncorrected ml/min/mmHg 17.48   DLCO UNC% % 80   DLVA Predicted % 92   TLC L 5.58   TLC % Predicted % 98   RV % Predicted % 119     Outpatient Medications Prior to Visit  Medication Sig Dispense Refill   albuterol (VENTOLIN HFA) 108 (90 Base) MCG/ACT inhaler TAKE 2  PUFFS BY MOUTH EVERY 6 HOURS AS NEEDED FOR WHEEZE OR SHORTNESS OF BREATH 8.5 each 2   amLODipine (NORVASC) 2.5 MG tablet TAKE 2 TABLETS BY MOUTH EVERY DAY 90 tablet 1   aspirin EC 81 MG tablet Take 81 mg by mouth daily. Swallow whole.     atorvastatin (LIPITOR) 20 MG tablet TAKE 1 TABLET BY MOUTH EVERY DAY 90 tablet 3   blood glucose meter kit and supplies KIT Dispense based on patient and insurance preference. Use up to four times daily as directed. 1 each 0   Calcium Carbonate-Vit D-Min (CALCIUM 600+D PLUS MINERALS) 600-400 MG-UNIT TABS Take 1 tablet by mouth daily.      diclofenac sodium (VOLTAREN) 1 % GEL Apply 1 application  topically 4 (four) times daily as needed (knee pain.).     DULoxetine (CYMBALTA) 60 MG capsule TAKE 1 CAPSULE BY MOUTH EVERY DAY 90 capsule 3   Glycopyrrolate-Formoterol (BEVESPI AEROSPHERE) 9-4.8 MCG/ACT AERO Inhale 2 puffs into the lungs 2 (two) times daily. 3 each 11   Lancet Devices (ONE TOUCH DELICA LANCING DEV) MISC 1 Device by Does not apply route daily. 1 each 2   Lancets  (ONETOUCH DELICA PLUS LANCET30G) MISC USE AS INSTRUCTED 100 each 12   levothyroxine (SYNTHROID) 112 MCG tablet Take 1 tablet (112 mcg total) by mouth daily. 30 tablet 11   losartan (COZAAR) 50 MG tablet TAKE 1 TABLET BY MOUTH EVERY DAY 90 tablet 1   metFORMIN (GLUCOPHAGE) 1000 MG tablet Take 1 tablet (1,000 mg total) by mouth 2 (two) times daily with a meal. (Patient taking differently: Take 1,500 mg by mouth 2 (two) times daily with a meal. 1000 mg in the morning 500 mg in the evening) 180 tablet 3   metoprolol tartrate (LOPRESSOR) 25 MG tablet Take 1 tablet (25 mg total) by mouth 2 (two) times daily. 90 tablet 1   ONETOUCH ULTRA test strip TEST BLOOD SUGAR 1-2 TIMES A DAY 100 strip 5   potassium chloride SA (KLOR-CON M) 20 MEQ tablet Take by mouth.     traMADol (ULTRAM) 50 MG tablet Take 1 tablet (50 mg total) by mouth every 12 (twelve) hours as needed for severe pain (pain score 7-10). 30 tablet 2   Dextromethorphan-guaiFENesin 5-100 MG/5ML LIQD Take 10 mLs by mouth every 8 (eight) hours as needed (for cough). (Patient not taking: Reported on 11/14/2023) 118 mL 0   oseltamivir (TAMIFLU) 75 MG capsule Take 1 capsule (75 mg total) by mouth 2 (two) times daily. (Patient not taking: Reported on 11/14/2023) 7 capsule 0   No facility-administered medications prior to visit.

## 2023-11-16 ENCOUNTER — Other Ambulatory Visit: Payer: Self-pay

## 2023-11-16 DIAGNOSIS — R0602 Shortness of breath: Secondary | ICD-10-CM

## 2023-11-16 MED ORDER — ALBUTEROL SULFATE HFA 108 (90 BASE) MCG/ACT IN AERS: INHALATION_SPRAY | RESPIRATORY_TRACT | 11 refills | Status: AC

## 2023-11-16 NOTE — Progress Notes (Signed)
 Received fax from [facility] asking for a refill on Albuterol.  I have sent in the refill.   Nothing further needed.

## 2023-12-01 ENCOUNTER — Inpatient Hospital Stay: Payer: Medicare Other | Admitting: Oncology

## 2023-12-01 ENCOUNTER — Inpatient Hospital Stay: Payer: Medicare Other | Attending: Oncology

## 2023-12-01 ENCOUNTER — Encounter: Payer: Self-pay | Admitting: Oncology

## 2023-12-01 VITALS — BP 133/77 | HR 78 | Temp 96.0°F | Resp 18 | Wt 218.8 lb

## 2023-12-01 DIAGNOSIS — Z8249 Family history of ischemic heart disease and other diseases of the circulatory system: Secondary | ICD-10-CM | POA: Insufficient documentation

## 2023-12-01 DIAGNOSIS — E119 Type 2 diabetes mellitus without complications: Secondary | ICD-10-CM | POA: Insufficient documentation

## 2023-12-01 DIAGNOSIS — I10 Essential (primary) hypertension: Secondary | ICD-10-CM | POA: Insufficient documentation

## 2023-12-01 DIAGNOSIS — Z79899 Other long term (current) drug therapy: Secondary | ICD-10-CM | POA: Insufficient documentation

## 2023-12-01 DIAGNOSIS — C7A8 Other malignant neuroendocrine tumors: Secondary | ICD-10-CM

## 2023-12-01 DIAGNOSIS — C642 Malignant neoplasm of left kidney, except renal pelvis: Secondary | ICD-10-CM | POA: Diagnosis not present

## 2023-12-01 DIAGNOSIS — E039 Hypothyroidism, unspecified: Secondary | ICD-10-CM | POA: Insufficient documentation

## 2023-12-01 DIAGNOSIS — E785 Hyperlipidemia, unspecified: Secondary | ICD-10-CM | POA: Diagnosis not present

## 2023-12-01 DIAGNOSIS — K760 Fatty (change of) liver, not elsewhere classified: Secondary | ICD-10-CM | POA: Insufficient documentation

## 2023-12-01 DIAGNOSIS — Z803 Family history of malignant neoplasm of breast: Secondary | ICD-10-CM | POA: Diagnosis not present

## 2023-12-01 DIAGNOSIS — Z87891 Personal history of nicotine dependence: Secondary | ICD-10-CM | POA: Insufficient documentation

## 2023-12-01 DIAGNOSIS — Z833 Family history of diabetes mellitus: Secondary | ICD-10-CM | POA: Diagnosis not present

## 2023-12-01 DIAGNOSIS — Z8042 Family history of malignant neoplasm of prostate: Secondary | ICD-10-CM | POA: Insufficient documentation

## 2023-12-01 DIAGNOSIS — Z8349 Family history of other endocrine, nutritional and metabolic diseases: Secondary | ICD-10-CM | POA: Diagnosis not present

## 2023-12-01 DIAGNOSIS — Z79890 Hormone replacement therapy (postmenopausal): Secondary | ICD-10-CM | POA: Insufficient documentation

## 2023-12-01 DIAGNOSIS — Z807 Family history of other malignant neoplasms of lymphoid, hematopoietic and related tissues: Secondary | ICD-10-CM | POA: Insufficient documentation

## 2023-12-01 DIAGNOSIS — Z9089 Acquired absence of other organs: Secondary | ICD-10-CM | POA: Diagnosis not present

## 2023-12-01 DIAGNOSIS — I2510 Atherosclerotic heart disease of native coronary artery without angina pectoris: Secondary | ICD-10-CM | POA: Diagnosis not present

## 2023-12-01 LAB — CBC WITH DIFFERENTIAL (CANCER CENTER ONLY)
Abs Immature Granulocytes: 0.02 10*3/uL (ref 0.00–0.07)
Basophils Absolute: 0.1 10*3/uL (ref 0.0–0.1)
Basophils Relative: 1 %
Eosinophils Absolute: 0.7 10*3/uL — ABNORMAL HIGH (ref 0.0–0.5)
Eosinophils Relative: 11 %
HCT: 42.5 % (ref 36.0–46.0)
Hemoglobin: 14 g/dL (ref 12.0–15.0)
Immature Granulocytes: 0 %
Lymphocytes Relative: 32 %
Lymphs Abs: 2.2 10*3/uL (ref 0.7–4.0)
MCH: 31.3 pg (ref 26.0–34.0)
MCHC: 32.9 g/dL (ref 30.0–36.0)
MCV: 95.1 fL (ref 80.0–100.0)
Monocytes Absolute: 1 10*3/uL (ref 0.1–1.0)
Monocytes Relative: 15 %
Neutro Abs: 2.8 10*3/uL (ref 1.7–7.7)
Neutrophils Relative %: 41 %
Platelet Count: 314 10*3/uL (ref 150–400)
RBC: 4.47 MIL/uL (ref 3.87–5.11)
RDW: 13.7 % (ref 11.5–15.5)
WBC Count: 6.9 10*3/uL (ref 4.0–10.5)
nRBC: 0 % (ref 0.0–0.2)

## 2023-12-01 LAB — CMP (CANCER CENTER ONLY)
ALT: 23 U/L (ref 0–44)
AST: 30 U/L (ref 15–41)
Albumin: 3.8 g/dL (ref 3.5–5.0)
Alkaline Phosphatase: 60 U/L (ref 38–126)
Anion gap: 10 (ref 5–15)
BUN: 17 mg/dL (ref 8–23)
CO2: 25 mmol/L (ref 22–32)
Calcium: 9.3 mg/dL (ref 8.9–10.3)
Chloride: 101 mmol/L (ref 98–111)
Creatinine: 0.85 mg/dL (ref 0.44–1.00)
GFR, Estimated: >60 mL/min (ref >60)
Glucose, Bld: 85 mg/dL (ref 70–99)
Potassium: 3.9 mmol/L (ref 3.5–5.1)
Sodium: 136 mmol/L (ref 135–145)
Total Bilirubin: 0.6 mg/dL (ref 0.0–1.2)
Total Protein: 7.3 g/dL (ref 6.5–8.1)

## 2023-12-01 LAB — LACTATE DEHYDROGENASE: LDH: 146 U/L (ref 98–192)

## 2023-12-01 NOTE — Progress Notes (Signed)
 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  S/p cryoablation.   Jan 2025 No new lesions in the kidneys. No lymphadenopathy. repeat MRI in 1 year-Jan 2026  Former smoker Continue follow-up with lung cancer screening program.  She is up-to-date for lung cancer screening.   Orders Placed This Encounter  Procedures   CBC with Differential (Cancer Center Only)    Standing Status:   Future    Expected Date:   11/30/2024    Expiration Date:   11/30/2024   CMP (Cancer Center only)    Standing Status:   Future    Expected Date:   11/30/2024    Expiration Date:   11/30/2024   Lactate dehydrogenase    Standing Status:   Future    Expected Date:   11/30/2024    Expiration Date:   11/30/2024   Chromogranin A    Standing Status:   Future    Expected Date:   11/30/2024    Expiration Date:   11/30/2024   Follow-up in 1 year. All questions were answered. The patient knows to call the clinic with any problems, questions or concerns.  Rickard Patience, MD, PhD [facility] Hematology Oncology 12/01/2023   HISTORY OF PRESENTING ILLNESS:  Cynthia [name] is a  78 y.o.  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 All three ascending colon polyps were tubular adenoma. 5 sigmoid colon polyps were hyperplastic polyps. One polyp turned out to be well defferentiated neuroendocrine tumor, 2.23mm, grade  1.   The tumor was small and the tattoo of the site of tumor removal is not feasible per GI.  Case was discussed on tumor board and recommend surveillance.  02/09/2018 dotatate PET scan was negative Patient had colonoscopy surveillance   # S/p repeat colonoscopy on 08/10/2018. Polyps resected. Pathology negative malignancy.  # She recently had a CT chest lung cancer screening.  With incidental findings of low attenuation lesion of the left kidney.  03/02/2020 ultrasound kidney showed 2.7 cm solid-appearing lesion of the left kidney which is suspicious for renal cell carcinoma. 03/09/2020 MRI abdomen with and without contrast showed complex cystic lesion arising from the upper pole of the left kidney measuring 2.1 cm.  Compatible with cystic renal cell carcinoma.  She also has right kidney lesion. Mild hepatic steatosis  # 04/29/2020 s/p image guided left renal mass (T1 lesion) cryoablation by Dr.Hassell.  # 09/25/20 MRI abdomen with and without contrast showed post ablation changes in the left kidney without evidence of residual at the site of ablation.  Nodular area seen in the right lung base is more focal seen the absence of significant atelectasis measuring approximately 13mm .  03/08/2021 MRI abdomen with and without contrast showed post ablation changes in the left kidney without evidence of residual or recurrent disease at the site of ablation. 11/23/21  MR abdomen w wo contrast showed Contraction of the ablation site in the medial LEFT kidney compared to MRI  03/08/2021. No significant enhancement or nodularity present to suggest recurrence.    Family history of breast cancer.  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   Genetic testing showed no pathological mutations.  INTERVAL HISTORY Cynthia [name] is a 78 y.o. 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 Patient reports  feeling well.  She has no complaints. Denies any nausea vomiting, diarrhea, sob, chest pain, abdominal pain.  Patient has good appetite    Review of Systems  Constitutional:  Negative for chills, fever, malaise/fatigue and weight loss.  HENT:  Negative for sore throat.   Eyes:  Negative for redness.  Respiratory:  Negative for cough, shortness of breath and wheezing.   Cardiovascular:  Negative for chest pain, palpitations and leg swelling.  Gastrointestinal:  Negative for abdominal pain, blood in stool, nausea and vomiting.  Genitourinary:  Negative for dysuria.  Musculoskeletal:  Negative for joint pain and myalgias.  Skin:  Negative for rash.  Neurological:  Negative for dizziness, tingling and tremors.  Endo/Heme/Allergies:  Does not bruise/bleed easily.  Psychiatric/Behavioral:  Negative for hallucinations.     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 denies   Diabetes mellitus without complication (HCC)    type 2   Family history of breast cancer    Family history of lymphoma    Family history of prostate cancer    GERD (gastroesophageal reflux disease)    Hyperlipidemia    Hypertension    Hypothyroidism    Lung nodule    Thyroid disease     SURGICAL HISTORY: Past Surgical History:  Procedure Laterality Date   BREAST BIOPSY Left 11/25/2015   stereo  neg   BREAST EXCISIONAL BIOPSY Left yrs ago   benign   COLONOSCOPY WITH PROPOFOL N/A 04/20/2017   Procedure: COLONOSCOPY WITH PROPOFOL;  Surgeon: Wyline Mood, MD;  Location: [facility] ENDOSCOPY;  Service: Endoscopy;  Laterality: N/A;   COLONOSCOPY WITH PROPOFOL N/A 01/08/2018   Procedure: COLONOSCOPY WITH PROPOFOL;  Surgeon: Wyline Mood, MD;  Location: [facility] ENDOSCOPY;  Service: Gastroenterology;  Laterality: N/A;   COLONOSCOPY WITH PROPOFOL N/A  08/10/2018   Procedure: COLONOSCOPY WITH PROPOFOL;  Surgeon: Wyline Mood, MD;  Location: [facility] ENDOSCOPY;  Service: Gastroenterology;  Laterality: N/A;   COLONOSCOPY WITH PROPOFOL N/A 02/08/2021   Procedure: COLONOSCOPY WITH PROPOFOL;  Surgeon: Wyline Mood, MD;  Location: [facility] ENDOSCOPY;  Service: Gastroenterology;  Laterality: N/A;   IR RADIOLOGIST EVAL & MGMT  04/08/2020   IR RADIOLOGIST EVAL & MGMT  05/26/2020   IR RADIOLOGIST EVAL & MGMT  09/29/2020   IR RADIOLOGIST EVAL & MGMT  03/10/2021   IR RADIOLOGIST EVAL & MGMT  12/10/2021   RADIOLOGY WITH ANESTHESIA N/A 04/29/2020   Procedure: CRYOABLATION;  Surgeon: Oley Balm, MD;  Location: [facility] ORS;  Service: Radiology;  Laterality: N/A;   THYROIDECTOMY     Dr. Anda Kraft   VAGINAL DELIVERY     4    SOCIAL HISTORY: Social History   Socioeconomic History   Marital status: Widowed    Spouse name: Not on file   Number of children: 4   Years of education: Not on file   Highest education level: Not on file  Occupational History   Not on file  Tobacco Use   Smoking status: Former    Current packs/day: 0.00    Average packs/day: 0.8 packs/day for 48.0 years (36.0 ttl pk-yrs)    Types: Cigarettes    Start date: 7    Quit date: 2015    Years since quitting: 10.2   Smokeless tobacco: Former    Types: Snuff, Chew  Vaping Use   Vaping status: Never Used  Substance and Sexual Activity   Alcohol use: No   Drug use: No   Sexual activity: Not Currently  Other Topics Concern   Not on file  Social History Narrative   Widow Has 4 children.      4 grandchildren.      Work - Retired from [employer]- walking the track, 4x per week      Diet- regular      Social Drivers of Corporate investment banker Strain: Low Risk  (05/03/2023)   Overall Financial Resource Strain (CARDIA)    Difficulty of Paying Living Expenses: Not hard at all  Food Insecurity: No Food Insecurity (10/20/2023)   Hunger Vital Sign    Worried About  Running Out of Food in the Last Year: Never true    Ran Out of Food in the Last Year: Never true  Transportation Needs: No Transportation Needs (10/20/2023)   PRAPARE - Administrator, Civil Service (Medical): No    Lack of Transportation (Non-Medical): No  Physical Activity: Insufficiently Active (05/03/2023)   Exercise Vital Sign    Days of Exercise per Week: 3 days    Minutes of Exercise per Session: 30 min  Stress: No Stress Concern Present (05/03/2023)   [employer] of Occupational Health - Occupational Stress Questionnaire    Feeling of Stress : Not at all  Social Connections: Moderately Integrated (10/20/2023)   Social Connection and Isolation Panel [NHANES]    Frequency of Communication with Friends and Family: More than three times a week    Frequency of Social Gatherings with Friends and Family: More than three times a week    Attends Religious Services: 1 to 4 times per year    Active Member of [employer] or Organizations: No    Attends Banker Meetings: 1 to 4 times per year    Marital Status: Widowed  Intimate Partner Violence: Not At Risk (10/20/2023)   Humiliation, Afraid, Rape, and Kick questionnaire    Fear of Current or Ex-Partner: No    Emotionally Abused: No    Physically Abused: No    Sexually Abused: No    FAMILY HISTORY: Family History  Problem Relation Age of Onset   Hypertension Mother    Hypertension Father    Prostate cancer Father 101   Diabetes Sister    Thyroid disease Sister    Breast cancer Sister 48   Breast cancer Sister 88   Lymphoma Sister 39   Breast cancer Maternal Aunt    Breast cancer Maternal Aunt    Cancer Paternal Grandmother        unk type    ALLERGIES:  has no known allergies.  MEDICATIONS:  Current Outpatient Medications  Medication Sig Dispense Refill   albuterol (VENTOLIN HFA) 108 (90 Base) MCG/ACT inhaler TAKE 2 PUFFS BY MOUTH EVERY 6 HOURS AS NEEDED FOR WHEEZE OR SHORTNESS OF BREATH 8.5 each 11    amLODipine (NORVASC) 2.5 MG tablet TAKE 2 TABLETS BY MOUTH EVERY  DAY 90 tablet 1   aspirin EC 81 MG tablet Take 81 mg by mouth daily. Swallow whole.     atorvastatin (LIPITOR) 20 MG tablet TAKE 1 TABLET BY MOUTH EVERY DAY 90 tablet 3   blood glucose meter kit and supplies KIT Dispense based on patient and insurance preference. Use up to four times daily as directed. 1 each 0   Calcium Carbonate-Vit D-Min (CALCIUM 600+D PLUS MINERALS) 600-400 MG-UNIT TABS Take 1 tablet by mouth daily.      diclofenac sodium (VOLTAREN) 1 % GEL Apply 1 application  topically 4 (four) times daily as needed (knee pain.).     DULoxetine (CYMBALTA) 60 MG capsule TAKE 1 CAPSULE BY MOUTH EVERY DAY 90 capsule 3   Glycopyrrolate-Formoterol (BEVESPI AEROSPHERE) 9-4.8 MCG/ACT AERO Inhale 2 puffs into the lungs 2 (two) times daily. 3 each 11   Lancet Devices (ONE TOUCH DELICA LANCING DEV) MISC 1 Device by Does not apply route daily. 1 each 2   Lancets (ONETOUCH DELICA PLUS LANCET30G) MISC USE AS INSTRUCTED 100 each 12   levothyroxine (SYNTHROID) 112 MCG tablet Take 1 tablet (112 mcg total) by mouth daily. 30 tablet 11   losartan (COZAAR) 50 MG tablet TAKE 1 TABLET BY MOUTH EVERY DAY 90 tablet 1   metFORMIN (GLUCOPHAGE) 1000 MG tablet Take 1 tablet (1,000 mg total) by mouth 2 (two) times daily with a meal. (Patient taking differently: Take 1,500 mg by mouth 2 (two) times daily with a meal. 1000 mg in the morning 500 mg in the evening) 180 tablet 3   metoprolol tartrate (LOPRESSOR) 25 MG tablet Take 1 tablet (25 mg total) by mouth 2 (two) times daily. 90 tablet 1   ONETOUCH ULTRA test strip TEST BLOOD SUGAR 1-2 TIMES A DAY 100 strip 5   potassium chloride SA (KLOR-CON M) 20 MEQ tablet Take by mouth.     traMADol (ULTRAM) 50 MG tablet Take 1 tablet (50 mg total) by mouth every 12 (twelve) hours as needed for severe pain (pain score 7-10). 30 tablet 2   No current facility-administered medications for this visit.     PHYSICAL  EXAMINATION: ECOG PERFORMANCE STATUS: 1 - Symptomatic but completely ambulatory Vitals:   12/01/23 1135  BP: 133/77  Pulse: 78  Resp: 18  Temp: (!) 96 F (35.6 C)  SpO2: 97%   Filed Weights   12/01/23 1135  Weight: 218 lb 12.8 oz (99.2 kg)    Physical Exam Constitutional:      General: She is not in acute distress.    Appearance: She is well-developed. She is obese.  HENT:     Head: Normocephalic and atraumatic.  Eyes:     General: No scleral icterus. Cardiovascular:     Rate and Rhythm: Normal rate and regular rhythm.  Pulmonary:     Effort: Pulmonary effort is normal. No respiratory distress.     Breath sounds: Normal breath sounds. No wheezing.  Abdominal:     General: Bowel sounds are normal. There is no distension.     Palpations: Abdomen is soft.     Tenderness: There is no abdominal tenderness.  Musculoskeletal:        General: No deformity. Normal range of motion.     Cervical back: Normal range of motion and neck supple.  Skin:    General: Skin is warm and dry.     Findings: No erythema or rash.  Neurological:     Mental Status: She is alert and oriented to person,  place, and time. Mental status is at baseline.     Cranial Nerves: No cranial nerve deficit.     Coordination: Coordination normal.  Psychiatric:        Mood and Affect: Mood normal.      LABORATORY DATA:  I have reviewed the data as listed    Latest Ref Rng & Units 12/01/2023   11:33 AM 10/19/2023    4:54 AM 10/18/2023   10:01 AM  CBC  WBC 4.0 - 10.5 K/uL 6.9  3.4  5.1   Hemoglobin 12.0 - 15.0 g/dL 40.9  81.1  91.4   Hematocrit 36.0 - 46.0 % 42.5  43.6  40.7   Platelets 150 - 400 K/uL 314  271  280       Latest Ref Rng & Units 12/01/2023   11:33 AM 10/19/2023    4:54 AM 10/18/2023   10:01 AM  CMP  Glucose 70 - 99 mg/dL 85  782  956   BUN 8 - 23 mg/dL 17  11  9    Creatinine 0.44 - 1.00 mg/dL 2.13  0.86  5.78   Sodium 135 - 145 mmol/L 136  138  132   Potassium 3.5 - 5.1 mmol/L 3.9   3.4  3.4   Chloride 98 - 111 mmol/L 101  101  97   CO2 22 - 32 mmol/L 25  24  24    Calcium 8.9 - 10.3 mg/dL 9.3  9.0  9.4   Total Protein 6.5 - 8.1 g/dL 7.3  7.1  7.7   Total Bilirubin 0.0 - 1.2 mg/dL 0.6  0.6  0.9   Alkaline Phos 38 - 126 U/L 60  55  62   AST 15 - 41 U/L 30  50  42   ALT 0 - 44 U/L 23  28  27

## 2023-12-01 NOTE — Assessment & Plan Note (Signed)
Continue follow-up with lung cancer screening program.  She is up-to-date for lung cancer screening.

## 2023-12-01 NOTE — Assessment & Plan Note (Addendum)
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  S/p cryoablation.   Jan 2025 No new lesions in the kidneys. No lymphadenopathy. repeat MRI in 1 year-Jan 2026

## 2023-12-01 NOTE — Assessment & Plan Note (Signed)
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

## 2023-12-04 LAB — CHROMOGRANIN A: Chromogranin A (ng/mL): 66.7 ng/mL (ref 0.0–101.8)

## 2023-12-06 ENCOUNTER — Other Ambulatory Visit: Payer: Medicare Other

## 2023-12-06 DIAGNOSIS — E039 Hypothyroidism, unspecified: Secondary | ICD-10-CM

## 2023-12-06 DIAGNOSIS — J9601 Acute respiratory failure with hypoxia: Secondary | ICD-10-CM | POA: Diagnosis not present

## 2023-12-06 LAB — COMPREHENSIVE METABOLIC PANEL
ALT: 17 U/L (ref 0–35)
AST: 22 U/L (ref 0–37)
Albumin: 4.3 g/dL (ref 3.5–5.2)
Alkaline Phosphatase: 72 U/L (ref 39–117)
BUN: 12 mg/dL (ref 6–23)
CO2: 28 meq/L (ref 19–32)
Calcium: 10.1 mg/dL (ref 8.4–10.5)
Chloride: 101 meq/L (ref 96–112)
Creatinine, Ser: 0.64 mg/dL (ref 0.40–1.20)
GFR: 84.8 mL/min (ref >60.00)
Glucose, Bld: 114 mg/dL — ABNORMAL HIGH (ref 70–99)
Potassium: 4.6 meq/L (ref 3.5–5.1)
Sodium: 137 meq/L (ref 135–145)
Total Bilirubin: 0.7 mg/dL (ref 0.2–1.2)
Total Protein: 7.2 g/dL (ref 6.0–8.3)

## 2023-12-06 LAB — CBC WITH DIFFERENTIAL/PLATELET
Basophils Absolute: 0.1 10*3/uL (ref 0.0–0.1)
Basophils Relative: 1.4 % (ref 0.0–3.0)
Eosinophils Absolute: 0.9 10*3/uL — ABNORMAL HIGH (ref 0.0–0.7)
Eosinophils Relative: 14.8 % — ABNORMAL HIGH (ref 0.0–5.0)
HCT: 42.5 % (ref 36.0–46.0)
Hemoglobin: 13.9 g/dL (ref 12.0–15.0)
Lymphocytes Relative: 29 % (ref 12.0–46.0)
Lymphs Abs: 1.7 10*3/uL (ref 0.7–4.0)
MCHC: 32.7 g/dL (ref 30.0–36.0)
MCV: 95.1 fl (ref 78.0–100.0)
Monocytes Absolute: 0.9 10*3/uL (ref 0.1–1.0)
Monocytes Relative: 16.2 % — ABNORMAL HIGH (ref 3.0–12.0)
Neutro Abs: 2.2 10*3/uL (ref 1.4–7.7)
Neutrophils Relative %: 38.6 % — ABNORMAL LOW (ref 43.0–77.0)
Platelets: 310 10*3/uL (ref 150.0–400.0)
RBC: 4.47 Mil/uL (ref 3.87–5.11)
RDW: 14.3 % (ref 11.5–15.5)
WBC: 5.8 10*3/uL (ref 4.0–10.5)

## 2023-12-06 LAB — TSH: TSH: 3.96 u[IU]/mL (ref 0.35–5.50)

## 2023-12-07 ENCOUNTER — Telehealth: Payer: Self-pay | Admitting: Student in an Organized Health Care Education/Training Program

## 2023-12-07 ENCOUNTER — Encounter: Payer: Self-pay | Admitting: Podiatry

## 2023-12-07 ENCOUNTER — Ambulatory Visit: Payer: Medicare Other | Admitting: Podiatry

## 2023-12-07 DIAGNOSIS — E1140 Type 2 diabetes mellitus with diabetic neuropathy, unspecified: Secondary | ICD-10-CM

## 2023-12-07 DIAGNOSIS — M79675 Pain in left toe(s): Secondary | ICD-10-CM

## 2023-12-07 DIAGNOSIS — J45909 Unspecified asthma, uncomplicated: Secondary | ICD-10-CM

## 2023-12-07 DIAGNOSIS — M79674 Pain in right toe(s): Secondary | ICD-10-CM | POA: Diagnosis not present

## 2023-12-07 DIAGNOSIS — B351 Tinea unguium: Secondary | ICD-10-CM

## 2023-12-07 MED ORDER — BREZTRI AEROSPHERE 160-9-4.8 MCG/ACT IN AERO: 2.0000 | INHALATION_SPRAY | Freq: Two times a day (BID) | RESPIRATORY_TRACT | 3 refills | Status: AC

## 2023-12-07 NOTE — Telephone Encounter (Signed)
 Wheezing at night started a couple of days ago. Congestion in her chest.  No Cough.  No shortness of breath.  No fevers, chills or sweats.  She said everything is ok except for the wheezing at night.  The wheezing started a couple of days ago and she has been on the [place] for months.  Bevespi- 2 puffs BID Albuterol- once  a day   Dr. Aundria Rud is out of the office. Dr. Larinda Buttery please advise.

## 2023-12-07 NOTE — Telephone Encounter (Signed)
 Per Dr. Larinda Buttery, he spoke with the patient.  Nothing further needed.

## 2023-12-07 NOTE — Telephone Encounter (Signed)
 Patient calling with wheezing at night but otherwise feeling ok. She denies any significant shortness of breath or dyspnea. Her PFTs are suggestive of asthma with significant response to bronchodilators and her Eos are elevated at 900 recently. She does not seem to be in acute exacerbation. I will switch her from [place] to [place] and should follow up with Dr. Aundria Rud in 2 to 4 weeks.   Janann Colonel, MD [facility] 12/07/2023 10:51 AM

## 2023-12-07 NOTE — Telephone Encounter (Signed)
 PT is wheezing at night. Wonders if it is the inhaler or possibly the "weather" she said. Please call to advise if she needs medication change or appt. Her # is [phone]

## 2023-12-14 DIAGNOSIS — M1711 Unilateral primary osteoarthritis, right knee: Secondary | ICD-10-CM | POA: Diagnosis not present

## 2023-12-14 NOTE — Progress Notes (Signed)
  Subjective:  Patient ID: Cynthia [name], female    DOB: 1946/04/26,  MRN: [id number]  78 y.o. female presents with at risk foot care with history of diabetic neuropathy and painful, elongated thickened toenails x 10 which are symptomatic when wearing enclosed shoe gear. This interferes with his/her daily activities..    New problem(s): None    PCP: Allegra Grana, FNP and last visit was: 09/28/2023.  Review of Systems: Negative except as noted in the HPI.   No Known Allergies  Objective:  There were no vitals filed for this visit. Constitutional Patient is a pleasant 78 y.o. female in NAD. AAO x 3.  Vascular Capillary fill time to digits immediate b/l.  DP/PT pulse(s) are palpable b/l lower extremities. Pedal hair sparse. Lower extremity skin temperature gradient within normal limits. No pain with calf compression b/l. No edema noted b/l lower extremities. No cyanosis or clubbing noted.   Neurologic Protective sensation intact 5/5 intact bilaterally with 10g monofilament b/l. Vibratory sensation intact b/l. No clonus b/l. Pt has subjective symptoms of neuropathy.  Dermatologic Pedal skin is warm and supple b/l.  No open wounds b/l lower extremities. No interdigital macerations b/l lower extremities. Toenails 1-5 b/l elongated, discolored, dystrophic, thickened, crumbly with subungual debris and tenderness to dorsal palpation. No corns, calluses nor porokeratotic lesions noted.  Orthopedic: Normal muscle strength 5/5 to all lower extremity muscle groups bilaterally. No gross bony deformities bilaterally.      Latest Ref Rng & Units 09/28/2023   10:12 AM 06/28/2023   11:01 AM 01/20/2023   10:12 AM  Hemoglobin A1C  Hemoglobin-A1c 4.6 - 6.5 % 7.0  6.5  6.9      Radiographs:  None  Assessment:   1. Pain due to onychomycosis of toenails of both feet   2. Type 2 diabetes mellitus with diabetic neuropathy, without long-term current use of insulin (HCC)    Plan:  Patient was evaluated  and treated. All patient's and/or POA's questions/concerns addressed on today's visit. Mycotic toenails 1-5 debrided in length and girth without incident.  Continue daily foot inspections and monitor blood glucose per PCP/Endocrinologist's recommendations.Continue soft, supportive shoe gear daily. Report any pedal injuries to medical professional. Call office if there are any quesitons/concerns. -Patient/POA to call should there be question/concern in the interim.  Return in about 3 months (around 03/08/2024).  Freddie Breech, DPM      [place] LOCATION: [address] [place] [address]                   Office [phone]   [facility] LOCATION: [address] [place], [place] [address] Office [phone]

## 2023-12-15 ENCOUNTER — Telehealth: Payer: Self-pay

## 2023-12-15 NOTE — Telephone Encounter (Signed)
 Copied from CRM [id number]. Topic: General - Other >> Dec 15, 2023  3:43 PM Jon Gills C wrote: Reason for CRM: Patient called in stating she had called a nurse this morning regarding her medications, was waiting on a callback from her, would like for someone to give her a callback regarding this

## 2023-12-15 NOTE — Addendum Note (Signed)
 Addended by: [place], Armentha Branagan on: 12/15/2023 09:54 AM   Modules accepted: Orders

## 2023-12-15 NOTE — Telephone Encounter (Signed)
Spoke to pt answered her question

## 2023-12-19 ENCOUNTER — Telehealth: Payer: Self-pay

## 2023-12-19 ENCOUNTER — Telehealth: Payer: Self-pay | Admitting: Family

## 2023-12-19 DIAGNOSIS — E1140 Type 2 diabetes mellitus with diabetic neuropathy, unspecified: Secondary | ICD-10-CM

## 2023-12-19 NOTE — Telephone Encounter (Signed)
 Lab orders

## 2023-12-19 NOTE — Telephone Encounter (Signed)
 Fixed.

## 2023-12-19 NOTE — Telephone Encounter (Signed)
 Patient need lab orders.

## 2023-12-22 NOTE — Telephone Encounter (Signed)
 Copied from CRM [id number]. Topic: General - Call Back - No Documentation >> Dec 22, 2023 10:54 AM Kathryne Eriksson wrote: Reason for CRM: Returning Office Call >> Dec 22, 2023 11:00 AM Kathryne Eriksson wrote: Patient returning a office call from [place], [place], [place] . Patient states she is actively taking the Potassium Chloride, patient is still requesting a call back to speak with her directly.

## 2023-12-22 NOTE — Telephone Encounter (Signed)
 Spoke to pt and was able to get clarification on Medication and who prescribed it .  Dr from [facility]

## 2023-12-27 ENCOUNTER — Other Ambulatory Visit (INDEPENDENT_AMBULATORY_CARE_PROVIDER_SITE_OTHER)

## 2023-12-27 ENCOUNTER — Ambulatory Visit: Payer: Medicare Other | Admitting: Family

## 2023-12-27 DIAGNOSIS — E1140 Type 2 diabetes mellitus with diabetic neuropathy, unspecified: Secondary | ICD-10-CM

## 2023-12-27 LAB — CBC WITH DIFFERENTIAL/PLATELET
Basophils Absolute: 0 10*3/uL (ref 0.0–0.1)
Basophils Relative: 1 % (ref 0.0–3.0)
Eosinophils Absolute: 0.3 10*3/uL (ref 0.0–0.7)
Eosinophils Relative: 6.8 % — ABNORMAL HIGH (ref 0.0–5.0)
HCT: 40.6 % (ref 36.0–46.0)
Hemoglobin: 13.4 g/dL (ref 12.0–15.0)
Lymphocytes Relative: 27.4 % (ref 12.0–46.0)
Lymphs Abs: 1.3 10*3/uL (ref 0.7–4.0)
MCHC: 33.1 g/dL (ref 30.0–36.0)
MCV: 96.8 fl (ref 78.0–100.0)
Monocytes Absolute: 0.7 10*3/uL (ref 0.1–1.0)
Monocytes Relative: 14.3 % — ABNORMAL HIGH (ref 3.0–12.0)
Neutro Abs: 2.5 10*3/uL (ref 1.4–7.7)
Neutrophils Relative %: 50.5 % (ref 43.0–77.0)
Platelets: 320 10*3/uL (ref 150.0–400.0)
RBC: 4.19 Mil/uL (ref 3.87–5.11)
RDW: 14.5 % (ref 11.5–15.5)
WBC: 4.9 10*3/uL (ref 4.0–10.5)

## 2023-12-27 NOTE — Addendum Note (Signed)
 Addended by: [place], Samai Corea on: 12/27/2023 03:02 PM   Modules accepted: Orders

## 2023-12-28 ENCOUNTER — Telehealth: Payer: Self-pay

## 2023-12-28 NOTE — Telephone Encounter (Signed)
-----   Message from Rennie Plowman sent at 12/27/2023  2:09 PM EDT ----- Call pt Pleased to see that monocytes and eosinophils are decreasing.  this most likely means a viral or allergic etiology as a reason for increase.    order CBC with differential and schedule in 2 months time.

## 2023-12-28 NOTE — Telephone Encounter (Signed)
 LVM to call back to discuss results below labs have been ordered, pt already has appt in 2 mnths she can get labs done  when she comes in for appt. Please relay message when pt calls back Call pt Pleased to see that monocytes and eosinophils are decreasing.  this most likely means a viral or allergic etiology as a reason for increase.   order CBC with differential and schedule in 2 months time

## 2024-01-09 ENCOUNTER — Other Ambulatory Visit: Payer: Self-pay | Admitting: Interventional Radiology

## 2024-01-09 DIAGNOSIS — N2889 Other specified disorders of kidney and ureter: Secondary | ICD-10-CM

## 2024-01-15 DIAGNOSIS — J441 Chronic obstructive pulmonary disease with (acute) exacerbation: Secondary | ICD-10-CM | POA: Diagnosis not present

## 2024-01-15 DIAGNOSIS — I2510 Atherosclerotic heart disease of native coronary artery without angina pectoris: Secondary | ICD-10-CM | POA: Diagnosis not present

## 2024-01-15 DIAGNOSIS — E1140 Type 2 diabetes mellitus with diabetic neuropathy, unspecified: Secondary | ICD-10-CM | POA: Diagnosis not present

## 2024-01-15 DIAGNOSIS — E782 Mixed hyperlipidemia: Secondary | ICD-10-CM | POA: Diagnosis not present

## 2024-01-15 DIAGNOSIS — I10 Essential (primary) hypertension: Secondary | ICD-10-CM | POA: Diagnosis not present

## 2024-01-15 DIAGNOSIS — I700 Atherosclerosis of aorta: Secondary | ICD-10-CM | POA: Diagnosis not present

## 2024-01-15 DIAGNOSIS — N182 Chronic kidney disease, stage 2 (mild): Secondary | ICD-10-CM | POA: Diagnosis not present

## 2024-01-15 DIAGNOSIS — Z87891 Personal history of nicotine dependence: Secondary | ICD-10-CM | POA: Diagnosis not present

## 2024-01-21 ENCOUNTER — Telehealth: Payer: Self-pay | Admitting: Family

## 2024-01-21 DIAGNOSIS — R899 Unspecified abnormal finding in specimens from other organs, systems and tissues: Secondary | ICD-10-CM

## 2024-01-21 NOTE — Telephone Encounter (Signed)
 Call pt Reviewing chart and I would recommend repeat urine protein test as last obtained 5/24  The calculation in the [facility] software had been incorrect. Previously her urine protein had been slightly elevated and in the setting of DM , I want to ensure we are monitoring.   Order and sch urine protein in the next couple of weeks

## 2024-01-22 NOTE — Telephone Encounter (Signed)
 LVM to schedule urine micro, lab is ordered please schedule when pt calls back  Call pt Reviewing chart and I would recommend repeat urine protein test as last obtained 5/24  The calculation in the [facility] software had been incorrect. Previously her urine protein had been slightly elevated and in the setting of DM , I want to ensure we are monitoring.    Order and sch urine protein in the next couple of weeks

## 2024-01-24 NOTE — Telephone Encounter (Signed)
 LVM to schedule urine micro, lab is ordered please schedule when pt calls back   Call pt Reviewing chart and I would recommend repeat urine protein test as last obtained 5/24  The calculation in the [facility] software had been incorrect. Previously her urine protein had been slightly elevated and in the setting of DM , I want to ensure we are monitoring.    Order and sch urine protein in the next couple of weeks

## 2024-01-25 ENCOUNTER — Other Ambulatory Visit (INDEPENDENT_AMBULATORY_CARE_PROVIDER_SITE_OTHER)

## 2024-01-25 ENCOUNTER — Ambulatory Visit: Admitting: Family

## 2024-01-25 DIAGNOSIS — R899 Unspecified abnormal finding in specimens from other organs, systems and tissues: Secondary | ICD-10-CM | POA: Diagnosis not present

## 2024-01-25 NOTE — Progress Notes (Unsigned)
 Assessment & Plan:  There are no diagnoses linked to this encounter.   Return precautions given.   Risks, benefits, and alternatives of the medications and treatment plan prescribed today were discussed, and patient expressed understanding.   Education regarding symptom management and diagnosis given to patient on AVS either electronically or printed.  No follow-ups on file.  Bascom Bossier, FNP  Subjective:    Patient ID: Cynthia [name], female    DOB: 01-18-1946, 78 y.o.   MRN: [id number]  CC: Cynthia [name] is a 78 y.o. female who presents today for an acute visit.    HPI: HPI  Allergies: Patient has no known allergies. Current Outpatient Medications on File Prior to Visit  Medication Sig Dispense Refill   albuterol  (VENTOLIN  HFA) 108 (90 Base) MCG/ACT inhaler TAKE 2 PUFFS BY MOUTH EVERY 6 HOURS AS NEEDED FOR WHEEZE OR SHORTNESS OF BREATH 8.5 each 11   amLODipine  (NORVASC ) 2.5 MG tablet TAKE 2 TABLETS BY MOUTH EVERY DAY 90 tablet 1   aspirin  EC 81 MG tablet Take 81 mg by mouth daily. Swallow whole.     atorvastatin  (LIPITOR) 20 MG tablet TAKE 1 TABLET BY MOUTH EVERY DAY 90 tablet 3   blood glucose meter kit and supplies KIT Dispense based on patient and insurance preference. Use up to four times daily as directed. 1 each 0   budeson-glycopyrrolate-formoterol  (BREZTRI  AEROSPHERE) 160-9-4.8 MCG/ACT AERO Inhale 2 puffs into the lungs in the morning and at bedtime. 3 each 3   Calcium  Carbonate-Vit D-Min (CALCIUM  600+D PLUS MINERALS) 600-400 MG-UNIT TABS Take 1 tablet by mouth daily.      diclofenac  sodium (VOLTAREN ) 1 % GEL Apply 1 application  topically 4 (four) times daily as needed (knee pain.).     DULoxetine  (CYMBALTA ) 60 MG capsule TAKE 1 CAPSULE BY MOUTH EVERY DAY 90 capsule 3   Glycopyrrolate-Formoterol  (BEVESPI  AEROSPHERE) 9-4.8 MCG/ACT AERO Inhale 2 puffs into the lungs 2 (two) times daily. 3 each 11   Lancet Devices (ONE TOUCH DELICA LANCING DEV) MISC 1 Device by  Does not apply route daily. 1 each 2   Lancets (ONETOUCH DELICA PLUS LANCET30G) MISC USE AS INSTRUCTED 100 each 12   levothyroxine  (SYNTHROID ) 112 MCG tablet Take 1 tablet (112 mcg total) by mouth daily. 30 tablet 11   losartan  (COZAAR ) 50 MG tablet TAKE 1 TABLET BY MOUTH EVERY DAY 90 tablet 1   metFORMIN  (GLUCOPHAGE ) 1000 MG tablet Take 1 tablet (1,000 mg total) by mouth 2 (two) times daily with a meal. (Patient taking differently: Take 1,500 mg by mouth 2 (two) times daily with a meal. 1000 mg in the morning 500 mg in the evening) 180 tablet 3   metoprolol  tartrate (LOPRESSOR ) 25 MG tablet Take 1 tablet (25 mg total) by mouth 2 (two) times daily. 90 tablet 1   ONETOUCH ULTRA test strip TEST BLOOD SUGAR 1-2 TIMES A DAY 100 strip 5   potassium chloride  SA (KLOR-CON  M) 20 MEQ tablet Take by mouth.     traMADol  (ULTRAM ) 50 MG tablet Take 1 tablet (50 mg total) by mouth every 12 (twelve) hours as needed for severe pain (pain score 7-10). 30 tablet 2   No current facility-administered medications on file prior to visit.    Review of Systems    Objective:    There were no vitals taken for this visit.  BP Readings from Last 3 Encounters:  12/01/23 133/77  11/14/23 96/66  10/25/23 128/84   Wt Readings from Last  3 Encounters:  12/01/23 218 lb 12.8 oz (99.2 kg)  11/14/23 217 lb 6.4 oz (98.6 kg)  10/25/23 210 lb 6.4 oz (95.4 kg)    Physical Exam

## 2024-01-25 NOTE — Telephone Encounter (Signed)
 Noted. Thanks.

## 2024-01-25 NOTE — Telephone Encounter (Signed)
 Patient has been scheduled for lab on 01/29/2024.

## 2024-01-26 LAB — MICROALBUMIN / CREATININE URINE RATIO
Creatinine,U: 176.4 mg/dL
Microalb Creat Ratio: 7.9 mg/g (ref 0.0–30.0)
Microalb, Ur: 1.4 mg/dL (ref 0.0–1.9)

## 2024-01-29 ENCOUNTER — Other Ambulatory Visit

## 2024-01-29 ENCOUNTER — Ambulatory Visit
Admission: RE | Admit: 2024-01-29 | Discharge: 2024-01-29 | Disposition: A | Discharge status: Home / Self Care | Source: Ambulatory Visit | Attending: Interventional Radiology | Admitting: Interventional Radiology

## 2024-01-29 DIAGNOSIS — N2889 Other specified disorders of kidney and ureter: Secondary | ICD-10-CM

## 2024-01-29 DIAGNOSIS — C642 Malignant neoplasm of left kidney, except renal pelvis: Secondary | ICD-10-CM | POA: Diagnosis not present

## 2024-01-29 NOTE — Progress Notes (Signed)
 Patient ID: Cynthia [name], female   DOB: [birth year], 78 y.o.   MRN: [id number]       Chief Complaint: Patient was consulted remotely today (TeleHealth) for  follow up renal cell carcinoma post ablation   at the request of Kathrene Sinopoli.    Referring Physician(s): Abbie Abbey, MD   History of Present Illness: Cynthia [name] is a 78 y.o. female  with a history of neuroendocrine colon carcinoma and pulmonary nodules. 02/05/2018 PET/CT showed no residual/recurrent disease related to her colon carcinoma.  Small low-attenuation left renal lesion is in retrospect noted. 02/19/2020 on a follow-up CT chest study, interval increase in size of left renal mass was identified 03/02/2020 renal ultrasound confirms solitary 2.7 cm left mid renal mass 03/09/2020 MR confirms 2.1 cm complex left upper pole renal lesion, partially exophytic.  No regional invasion or adenopathy. The patient was asymptomatic from  this lesion.  No renal insufficiency.  No hematuria, flank pain, weight loss.  No history of kidney cancer.  Positive history of tobacco abuse. 04/29/2020 patient underwent CT-guided core biopsy and cryoablation of the left renal mass.  Surgical  pathology consistent with clear-cell carcinoma.  She did well postprocedure and was discharged home the following day as scheduled.  Minimal discomfort at the treatment site x2 days.  No hematuria. 09/25/2020 MRI abdomen without and with contrast showed post ablation change with no residual or recurrent tumor.  No regional adenopathy.  Nodular density seen in the right lung base of the lung. 03/08/2021 MR demonstrates stable post ablation changes, no residual/recurrent disease or evidence of metastatic disease 11/23/2021 MR: Contraction of the ablation site in the medial LEFT kidney compared to MRI 03/08/2021. No significant enhancement or nodularity present to suggest recurrence.  No new lesions in the kidneys.  05/24/22 CT chest lung cancer screening noted Mild  fullness noted right intrarenal collecting system, similar to prior  09/05/22 US  renal for followup noted Mild RIGHT-sided hydronephrosis.  10/01/22 scheduled surveillance MR showed Stable appearance of cryoablation defect in the posterior midpole the left kidney. No evidence of recurrent mass at this site. No other suspicious renal masses identified. Stable benign-appearing right renal sinus cyst (no followup imaging is recommended) . No evidence of hydronephrosis.  She had  her scheduled follow up MR on Oct 04, 2023. She remains asymptomatic.   No symptoms in the left flank in the region of treatment.  No hematuria or flank pain.    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 denies   Diabetes mellitus without complication (HCC)    type 2   Family history of breast cancer    Family history of lymphoma    Family history of prostate cancer    GERD (gastroesophageal reflux disease)    Hyperlipidemia    Hypertension    Hypothyroidism    Lung nodule    Thyroid  disease     Past Surgical History:  Procedure Laterality Date   BREAST BIOPSY Left 11/25/2015   stereo  neg   BREAST EXCISIONAL BIOPSY Left yrs ago   benign   COLONOSCOPY WITH PROPOFOL  N/A 04/20/2017   Procedure: COLONOSCOPY WITH PROPOFOL ;  Surgeon: Luke Salaam, MD;  Location: [facility] ENDOSCOPY;  Service: Endoscopy;  Laterality: N/A;   COLONOSCOPY WITH PROPOFOL  N/A 01/08/2018   Procedure: COLONOSCOPY WITH  PROPOFOL ;  Surgeon: Luke Salaam, MD;  Location[facility] ENDOSCOPY;  Service: Gastroenterology;  Laterality: N/A;   COLONOSCOPY WITH PROPOFOL  N/A 08/10/2018   Procedure: COLONOSCOPY WITH PROPOFOL ;  Surgeon: Luke Salaam, MD;  Location: [facility] ENDOSCOPY;  Service: Gastroenterology;  Laterality: N/A;   COLONOSCOPY WITH PROPOFOL  N/A 02/08/2021   Procedure: COLONOSCOPY WITH PROPOFOL ;   Surgeon: Luke Salaam, MD;  Location: [facility] ENDOSCOPY;  Service: Gastroenterology;  Laterality: N/A;   IR RADIOLOGIST EVAL & MGMT  04/08/2020   IR RADIOLOGIST EVAL & MGMT  05/26/2020   IR RADIOLOGIST EVAL & MGMT  09/29/2020   IR RADIOLOGIST EVAL & MGMT  03/10/2021   IR RADIOLOGIST EVAL & MGMT  12/10/2021   RADIOLOGY WITH ANESTHESIA N/A 04/29/2020   Procedure: CRYOABLATION;  Surgeon: Marland Silvas, MD;  Location: [facility] ORS;  Service: Radiology;  Laterality: N/A;   THYROIDECTOMY     Dr. Luise Saint   VAGINAL DELIVERY     4    Allergies: Patient has no known allergies.  Medications: Prior to Admission medications   Medication Sig Start Date End Date Taking? Authorizing Provider  albuterol  (VENTOLIN  HFA) 108 (90 Base) MCG/ACT inhaler TAKE 2 PUFFS BY MOUTH EVERY 6 HOURS AS NEEDED FOR WHEEZE OR SHORTNESS OF BREATH 11/16/23   Vergia Glasgow, MD  amLODipine  (NORVASC ) 2.5 MG tablet TAKE 2 TABLETS BY MOUTH EVERY DAY 10/11/23   Calista Catching, FNP  aspirin  EC 81 MG tablet Take 81 mg by mouth daily. Swallow whole.    [provider]  atorvastatin  (LIPITOR) 20 MG tablet TAKE 1 TABLET BY MOUTH EVERY DAY 04/07/23   Calista Catching, FNP  blood glucose meter kit and supplies KIT Dispense based on patient and insurance preference. Use up to four times daily as directed. 05/17/21   Calista Catching, FNP  budeson-glycopyrrolate-formoterol  (BREZTRI  AEROSPHERE) 160-9-4.8 MCG/ACT AERO Inhale 2 puffs into the lungs in the morning and at bedtime. 12/07/23   Assaker, Marianne Shirts, MD  Calcium  Carbonate-Vit D-Min (CALCIUM  600+D PLUS MINERALS) 600-400 MG-UNIT TABS Take 1 tablet by mouth daily.     [provider]  diclofenac  sodium (VOLTAREN ) 1 % GEL Apply 1 application  topically 4 (four) times daily as needed (knee pain.).    [provider]  DULoxetine  (CYMBALTA ) 60 MG capsule TAKE 1 CAPSULE BY MOUTH EVERY DAY 09/14/23   Calista Catching, FNP  Glycopyrrolate-Formoterol  (BEVESPI  AEROSPHERE)  9-4.8 MCG/ACT AERO Inhale 2 puffs into the lungs 2 (two) times daily. 09/29/23   Vergia Glasgow, MD  Lancet Devices (ONE TOUCH DELICA LANCING DEV) MISC 1 Device by Does not apply route daily. 11/16/22   Calista Catching, FNP  Lancets [facility] DELICA PLUS [place]) MISC USE AS INSTRUCTED 11/30/22   Calista Catching, FNP  levothyroxine  (SYNTHROID ) 112 MCG tablet Take 1 tablet (112 mcg total) by mouth daily. 10/19/23 10/18/24  Wouk, Haynes Lips, MD  losartan  (COZAAR ) 50 MG tablet TAKE 1 TABLET BY MOUTH EVERY DAY 09/11/23   Calista Catching, FNP  metFORMIN  (GLUCOPHAGE ) 1000 MG tablet Take 1 tablet (1,000 mg total) by mouth 2 (two) times daily with a meal. Patient taking differently: Take 1,500 mg by mouth 2 (two) times daily with a meal. 1000 mg in the morning 500 mg in the evening 10/05/23   Calista Catching, FNP  metoprolol  tartrate (LOPRESSOR ) 25 MG tablet Take 1 tablet (25 mg total) by mouth 2 (two) times daily. 04/17/19   Calista Catching, FNP  ONETOUCH ULTRA test strip TEST BLOOD SUGAR 1-2 TIMES  A DAY 07/22/22   Calista Catching, FNP  potassium chloride  SA (KLOR-CON  M) 20 MEQ tablet Take by mouth. 11/18/22 12/01/23  [provider]  traMADol  (ULTRAM ) 50 MG tablet Take 1 tablet (50 mg total) by mouth every 12 (twelve) hours as needed for severe pain (pain score 7-10). 08/01/23   Calista Catching, FNP     Family History  Problem Relation Age of Onset   Hypertension Mother    Hypertension Father    Prostate cancer Father 85   Diabetes Sister    Thyroid  disease Sister    Breast cancer Sister 25   Breast cancer Sister 51   Lymphoma Sister 55   Breast cancer Maternal Aunt    Breast cancer Maternal Aunt    Cancer Paternal Grandmother        unk type    Social History   Socioeconomic History   Marital status: Widowed    Spouse name: Not on file   Number of children: 4   Years of education: Not on file   Highest education level: Not on file  Occupational History   Not  on file  Tobacco Use   Smoking status: Former    Current packs/day: 0.00    Average packs/day: 0.8 packs/day for 48.0 years (36.0 ttl pk-yrs)    Types: Cigarettes    Start date: 35    Quit date: 2015    Years since quitting: 10.3   Smokeless tobacco: Former    Types: Snuff, Chew  Vaping Use   Vaping status: Never Used  Substance and Sexual Activity   Alcohol use: No   Drug use: No   Sexual activity: Not Currently  Other Topics Concern   Not on file  Social History Narrative   Widow Has 4 children.      4 grandchildren.      Work - Retired from [employer]- walking the track, 4x per week      Diet- regular      Social Drivers of Corporate investment banker Strain: Low Risk  (05/03/2023)   Overall Financial Resource Strain (CARDIA)    Difficulty of Paying Living Expenses: Not hard at all  Food Insecurity: No Food Insecurity (10/20/2023)   Hunger Vital Sign    Worried About Running Out of Food in the Last Year: Never true    Ran Out of Food in the Last Year: Never true  Transportation Needs: No Transportation Needs (10/20/2023)   PRAPARE - Administrator, Civil Service (Medical): No    Lack of Transportation (Non-Medical): No  Physical Activity: Insufficiently Active (05/03/2023)   Exercise Vital Sign    Days of Exercise per Week: 3 days    Minutes of Exercise per Session: 30 min  Stress: No Stress Concern Present (05/03/2023)   [employer] of Occupational Health - Occupational Stress Questionnaire    Feeling of Stress : Not at all  Social Connections: Moderately Integrated (10/20/2023)   Social Connection and Isolation Panel [NHANES]    Frequency of Communication with Friends and Family: More than three times a week    Frequency of Social Gatherings with Friends and Family: More than three times a week    Attends Religious Services: 1 to 4 times per year    Active Member of [employer] or Organizations: No    Attends Banker  Meetings: 1 to 4 times per year    Marital Status: Widowed  ECOG Status: 0 - Asymptomatic  Review of Systems  Review of Systems: A 12 point ROS discussed and pertinent positives are indicated in the HPI above.  All other systems are negative    Physical Exam No direct physical exam was performed (except for noted visual exam findings with Video Visits).    Vital Signs: There were no vitals taken for this visit.  Imaging: MRI ABDOMEN WITHOUT AND WITH CONTRAST   TECHNIQUE: Multiplanar multisequence MR imaging of the abdomen was performed both before and after the administration of intravenous contrast.   CONTRAST:  9mL GADAVIST  GADOBUTROL  1 MMOL/ML IV SOLN   COMPARISON:  10/01/2022   FINDINGS: Lower chest: No acute abnormality.   Hepatobiliary: No solid liver abnormality is seen. No gallstones, gallbladder wall thickening, or biliary dilatation.   Pancreas: Unremarkable. No pancreatic ductal dilatation or surrounding inflammatory changes.   Spleen: Normal in size without significant abnormality.   Adrenals/Urinary Tract: Adrenal glands are unremarkable. Unchanged cryoablation defect of the posterior lip of the midportion of the left kidney (series 4, image 19). No suspicious soft tissue or associated contrast enhancement to suggest residual or recurrent disease. Benign parapelvic right renal cysts, for which no further follow-up or characterization is required. No obvious calculi or hydronephrosis.   Stomach/Bowel: Stomach is within normal limits. No evidence of bowel wall thickening, distention, or inflammatory changes.   Vascular/Lymphatic: Aortic atherosclerosis. No enlarged abdominal lymph nodes.   Other: No abdominal wall hernia or abnormality. No ascites.   Musculoskeletal: No acute or significant osseous findings.   IMPRESSION: 1. Unchanged cryoablation defect of the posterior lip of the midportion of the left kidney. No suspicious soft tissue  or associated contrast enhancement to suggest residual or recurrent disease. 2. No evidence of lymphadenopathy or metastatic disease in the abdomen.   Aortic Atherosclerosis (ICD10-I70.0).     Electronically Signed   By: Fredricka Jenny M.D.   On: 10/11/2023 11:49  Labs:  CBC: Recent Labs    10/19/23 0454 12/01/23 1133 12/06/23 0939 12/27/23 0943  WBC 3.4* 6.9 5.8 4.9  HGB 14.7 14.0 13.9 13.4  HCT 43.6 42.5 42.5 40.6  PLT 271 314 310.0 320.0    COAGS: Recent Labs    10/18/23 1001  INR 1.1  APTT 31    BMP: Recent Labs    10/18/23 1001 10/19/23 0454 12/01/23 1133 12/06/23 0939  NA 132* 138 136 137  K 3.4* 3.4* 3.9 4.6  CL 97* 101 101 101  CO2 24 24 25 28   GLUCOSE 167* 149* 85 114*  BUN 9 11 17 12   CALCIUM  9.4 9.0 9.3 10.1  CREATININE 0.62 0.69 0.85 0.64  GFRNONAA >60 >60 >60  --     LIVER FUNCTION TESTS: Recent Labs    10/18/23 1001 10/19/23 0454 12/01/23 1133 12/06/23 0939  BILITOT 0.9 0.6 0.6 0.7  AST 42* 50* 30 22  ALT 27 28 23 17   ALKPHOS 62 55 60 72  PROT 7.7 7.1 7.3 7.2  ALBUMIN  4.1 3.6 3.8 4.3    TUMOR MARKERS: No results for input(s): "AFPTM", "CEA", "CA199", "CHROMGRNA" in the last 8760 hours.  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  No residual/recurrent tumor or evidence of regional adenopathy  on follow-up MRI.   Renal function great.   She is essentially asymptomatic from the lesion and from the procedure.  I am satisfied with her progress thus far.  We reviewed the importance of continued imaging surveillance for at least 5 years to  exclude any residual/recurrent neoplasm.  We discussed the anticipation of curative treatment.  She seemed to understand and did ask appropriate questions which were answered. Will set up MR with contrast at the 5 year mark (04/29/25 or so)  and follow-up with her after I see those  results. She knows to telephone with any questions or problems regarding the procedure in the interval if needed.   Thank you for this interesting consult.  I greatly enjoyed meeting APPIE DAMM and look forward to participating in their care.  A copy of this report was sent to the requesting provider on this date.  Electronically Signed: Dayne Sejal Cofield 01/29/2024, 8:37 AM   I spent a total of    25 Minutes in remote  clinical consultation, greater than 50% of which was counseling/coordinating care for renal cell carcinoma post ablation.    Visit type: Audio only (telephone). Audio (no video) only due to patient's lack of internet/smartphone capability. Alternative for in-person consultation at [facility], [place], [place].  This format is felt to be most appropriate for this patient at this time.  All issues noted in this document were discussed and addressed.

## 2024-01-30 ENCOUNTER — Ambulatory Visit: Admitting: Student in an Organized Health Care Education/Training Program

## 2024-01-30 ENCOUNTER — Ambulatory Visit: Payer: Self-pay

## 2024-01-30 ENCOUNTER — Encounter: Payer: Self-pay | Admitting: Student in an Organized Health Care Education/Training Program

## 2024-01-30 VITALS — BP 110/60 | HR 82 | Temp 97.1°F | Ht 69.0 in | Wt 221.0 lb

## 2024-01-30 DIAGNOSIS — J432 Centrilobular emphysema: Secondary | ICD-10-CM | POA: Diagnosis not present

## 2024-01-30 DIAGNOSIS — R0602 Shortness of breath: Secondary | ICD-10-CM | POA: Diagnosis not present

## 2024-01-30 NOTE — Progress Notes (Unsigned)
 Synopsis: Referred in *** by Calista Catching, FNP  Assessment & Plan:   There are no diagnoses linked to this encounter.  Doing well, all symptoms resolved, mild exertional dyspnea. Compliant with inhalers, up to date on vaccines. LDCT reassuring. Follow up in 1 year.  No follow-ups on file.  I spent *** minutes caring for this patient today, including {EM billing:[id number]}  Vergia Glasgow, MD [facility] 01/30/2024 2:32 PM    End of visit medications:  No orders of the defined types were placed in this encounter.    Current Outpatient Medications:    albuterol  (VENTOLIN  HFA) 108 (90 Base) MCG/ACT inhaler, TAKE 2 PUFFS BY MOUTH EVERY 6 HOURS AS NEEDED FOR WHEEZE OR SHORTNESS OF BREATH, Disp: 8.5 each, Rfl: 11   amLODipine  (NORVASC ) 2.5 MG tablet, TAKE 2 TABLETS BY MOUTH EVERY DAY, Disp: 90 tablet, Rfl: 1   aspirin  EC 81 MG tablet, Take 81 mg by mouth daily. Swallow whole., Disp: , Rfl:    atorvastatin  (LIPITOR) 20 MG tablet, TAKE 1 TABLET BY MOUTH EVERY DAY, Disp: 90 tablet, Rfl: 3   blood glucose meter kit and supplies KIT, Dispense based on patient and insurance preference. Use up to four times daily as directed., Disp: 1 each, Rfl: 0   budeson-glycopyrrolate-formoterol  (BREZTRI  AEROSPHERE) 160-9-4.8 MCG/ACT AERO, Inhale 2 puffs into the lungs in the morning and at bedtime., Disp: 3 each, Rfl: 3   Calcium  Carbonate-Vit D-Min (CALCIUM  600+D PLUS MINERALS) 600-400 MG-UNIT TABS, Take 1 tablet by mouth daily. , Disp: , Rfl:    diclofenac  sodium (VOLTAREN ) 1 % GEL, Apply 1 application  topically 4 (four) times daily as needed (knee pain.)., Disp: , Rfl:    DULoxetine  (CYMBALTA ) 60 MG capsule, TAKE 1 CAPSULE BY MOUTH EVERY DAY, Disp: 90 capsule, Rfl: 3   Lancet Devices (ONE TOUCH DELICA LANCING DEV) MISC, 1 Device by Does not apply route daily., Disp: 1 each, Rfl: 2   Lancets (ONETOUCH DELICA PLUS LANCET30G) MISC, USE AS INSTRUCTED, Disp: 100 each, Rfl: 12    levothyroxine  (SYNTHROID ) 112 MCG tablet, Take 1 tablet (112 mcg total) by mouth daily., Disp: 30 tablet, Rfl: 11   losartan  (COZAAR ) 50 MG tablet, TAKE 1 TABLET BY MOUTH EVERY DAY, Disp: 90 tablet, Rfl: 1   metFORMIN  (GLUCOPHAGE ) 1000 MG tablet, Take 1 tablet (1,000 mg total) by mouth 2 (two) times daily with a meal. (Patient taking differently: Take 1,500 mg by mouth 2 (two) times daily with a meal. 1000 mg in the morning 500 mg in the evening), Disp: 180 tablet, Rfl: 3   metoprolol  tartrate (LOPRESSOR ) 25 MG tablet, Take 1 tablet (25 mg total) by mouth 2 (two) times daily., Disp: 90 tablet, Rfl: 1   ONETOUCH ULTRA test strip, TEST BLOOD SUGAR 1-2 TIMES A DAY, Disp: 100 strip, Rfl: 5   traMADol  (ULTRAM ) 50 MG tablet, Take 1 tablet (50 mg total) by mouth every 12 (twelve) hours as needed for severe pain (pain score 7-10)., Disp: 30 tablet, Rfl: 2   Glycopyrrolate-Formoterol  (BEVESPI  AEROSPHERE) 9-4.8 MCG/ACT AERO, Inhale 2 puffs into the lungs 2 (two) times daily. (Patient not taking: Reported on 01/30/2024), Disp: 3 each, Rfl: 11   potassium chloride  SA (KLOR-CON  M) 20 MEQ tablet, Take by mouth., Disp: , Rfl:    Subjective:   PATIENT ID: Cynthia [name] GENDER: female DOB: April 06, 1946, MRN: [id number]  Chief Complaint  Patient presents with   Follow-up    DOE. No wheezing or cough.  HPI ***  Ancillary information including prior medications, full medical/surgical/family/social histories, and PFTs (when available) are listed below and have been reviewed.   ROS   Objective:  There were no vitals filed for this visit.   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 denies   Diabetes mellitus without complication (HCC)    type 2   Family history of breast cancer    Family history of lymphoma    Family history of prostate cancer    GERD (gastroesophageal reflux disease)    Hyperlipidemia    Hypertension    Hypothyroidism    Lung nodule    Thyroid  disease      Family History  Problem Relation Age of Onset   Hypertension Mother    Hypertension Father    Prostate cancer Father 79   Diabetes Sister    Thyroid  disease Sister    Breast cancer Sister 48   Breast cancer Sister 35   Lymphoma Sister 43   Breast cancer Maternal Aunt    Breast cancer Maternal Aunt    Cancer Paternal Grandmother        unk type     Past Surgical History:  Procedure Laterality Date   BREAST BIOPSY Left 11/25/2015   stereo  neg   BREAST EXCISIONAL BIOPSY Left yrs ago   benign   COLONOSCOPY WITH PROPOFOL  N/A 04/20/2017   Procedure: COLONOSCOPY WITH PROPOFOL ;  Surgeon: Luke Salaam, MD;  Location[facility] ENDOSCOPY;  Service: Endoscopy;  Laterality: N/A;   COLONOSCOPY WITH PROPOFOL  N/A 01/08/2018   Procedure: COLONOSCOPY WITH PROPOFOL ;  Surgeon: Luke Salaam, MD;  Location: [facility] ENDOSCOPY;  Service: Gastroenterology;  Laterality: N/A;   COLONOSCOPY WITH PROPOFOL  N/A 08/10/2018   Procedure: COLONOSCOPY WITH PROPOFOL ;  Surgeon: Luke Salaam, MD;  Location: [facility] ENDOSCOPY;  Service: Gastroenterology;  Laterality: N/A;   COLONOSCOPY WITH PROPOFOL  N/A 02/08/2021   Procedure: COLONOSCOPY WITH PROPOFOL ;  Surgeon: Luke Salaam, MD;  Location: [facility] ENDOSCOPY;  Service: Gastroenterology;  Laterality: N/A;   IR RADIOLOGIST EVAL & MGMT  04/08/2020   IR RADIOLOGIST EVAL & MGMT  05/26/2020   IR RADIOLOGIST EVAL & MGMT  09/29/2020   IR RADIOLOGIST EVAL & MGMT  03/10/2021   IR RADIOLOGIST EVAL & MGMT  12/10/2021   RADIOLOGY WITH  ANESTHESIA N/A 04/29/2020   Procedure: CRYOABLATION;  Surgeon: Marland Silvas, MD;  Location: [facility] ORS;  Service: Radiology;  Laterality: N/A;   THYROIDECTOMY     Dr. Luise Saint   VAGINAL DELIVERY     4    Social History   Socioeconomic History   Marital status: Widowed    Spouse name: Not on file   Number of children: 4   Years of education: Not on file   Highest education level: Not on file  Occupational History   Not on file  Tobacco Use   Smoking status: Former    Current packs/day:  0.00    Average packs/day: 0.8 packs/day for 48.0 years (36.0 ttl pk-yrs)    Types: Cigarettes    Start date: 54    Quit date: 2015    Years since quitting: 10.3   Smokeless tobacco: Former    Types: Snuff, Chew  Vaping Use   Vaping status: Never Used  Substance and Sexual Activity   Alcohol use: No   Drug use: No   Sexual activity: Not Currently  Other Topics Concern   Not on file  Social History Narrative   Widow Has 4 children.      4 grandchildren.      Work - Retired from [employer]- walking the track, 4x per week      Diet- regular      Social Drivers of Corporate investment banker Strain: Low Risk  (05/03/2023)   Overall Financial Resource Strain (CARDIA)    Difficulty of Paying Living Expenses: Not hard at all  Food Insecurity: No Food Insecurity (10/20/2023)   Hunger Vital Sign    Worried About Running Out of Food in the Last Year: Never true    Ran Out of Food in the Last Year: Never true  Transportation Needs: No Transportation Needs (10/20/2023)   PRAPARE - Administrator, Civil Service (Medical): No    Lack of Transportation (Non-Medical): No  Physical Activity: Insufficiently Active (05/03/2023)   Exercise Vital Sign    Days of Exercise per Week: 3 days    Minutes of Exercise per Session: 30 min  Stress: No Stress Concern Present (05/03/2023)   [employer] of Occupational Health - Occupational Stress Questionnaire    Feeling of  Stress : Not at all  Social Connections: Moderately Integrated (10/20/2023)   Social Connection and Isolation Panel [NHANES]    Frequency of Communication with Friends and Family: More than three times a week    Frequency of Social Gatherings with Friends and Family: More than three times a week    Attends Religious Services: 1 to 4 times per year    Active Member of [employer] or Organizations: No    Attends Banker Meetings: 1 to 4 times per year    Marital Status: Widowed  Intimate Partner Violence: Not At Risk (10/20/2023)   Humiliation, Afraid, Rape, and Kick questionnaire    Fear of Current or Ex-Partner: No    Emotionally Abused: No    Physically Abused: No    Sexually Abused: No     No Known Allergies   CBC    Component Value Date/Time   WBC 4.9 12/27/2023 0943   RBC 4.19 12/27/2023 0943   HGB 13.4 12/27/2023 0943   HGB 14.0 12/01/2023 1133   HCT 40.6 12/27/2023 0943   PLT 320.0 12/27/2023 0943   PLT 314 12/01/2023 1133   MCV 96.8 12/27/2023 0943   MCH 31.3 12/01/2023 1133   MCHC 33.1 12/27/2023 0943   RDW 14.5 12/27/2023 0943   LYMPHSABS 1.3 12/27/2023 0943   MONOABS 0.7 12/27/2023 0943   EOSABS 0.3 12/27/2023 0943   BASOSABS 0.0 12/27/2023 0943    Pulmonary Functions Testing Results:    Latest Ref Rng & Units 12/27/2022   10:22 AM  PFT Results  FVC-Pre L 2.61   FVC-Predicted Pre % 80   FVC-Post L 2.88   FVC-Predicted Post % 88   Pre FEV1/FVC % % 76   Post FEV1/FCV % % 79   FEV1-Pre L  1.98   FEV1-Predicted Pre % 81   FEV1-Post L 2.27   DLCO uncorrected ml/min/mmHg 17.48   DLCO UNC% % 80   DLVA Predicted % 92   TLC L 5.58   TLC % Predicted % 98   RV % Predicted % 119     Outpatient Medications Prior to Visit  Medication Sig Dispense Refill   albuterol  (VENTOLIN  HFA) 108 (90 Base) MCG/ACT inhaler TAKE 2 PUFFS BY MOUTH EVERY 6 HOURS AS NEEDED FOR WHEEZE OR SHORTNESS OF BREATH 8.5 each 11   amLODipine  (NORVASC ) 2.5 MG tablet TAKE 2 TABLETS BY  MOUTH EVERY DAY 90 tablet 1   aspirin  EC 81 MG tablet Take 81 mg by mouth daily. Swallow whole.     atorvastatin  (LIPITOR) 20 MG tablet TAKE 1 TABLET BY MOUTH EVERY DAY 90 tablet 3   blood glucose meter kit and supplies KIT Dispense based on patient and insurance preference. Use up to four times daily as directed. 1 each 0   budeson-glycopyrrolate-formoterol  (BREZTRI  AEROSPHERE) 160-9-4.8 MCG/ACT AERO Inhale 2 puffs into the lungs in the morning and at bedtime. 3 each 3   Calcium  Carbonate-Vit D-Min (CALCIUM  600+D PLUS MINERALS) 600-400 MG-UNIT TABS Take 1 tablet by mouth daily.      diclofenac  sodium (VOLTAREN ) 1 % GEL Apply 1 application  topically 4 (four) times daily as needed (knee pain.).     DULoxetine  (CYMBALTA ) 60 MG capsule TAKE 1 CAPSULE BY MOUTH EVERY DAY 90 capsule 3   Lancet Devices (ONE TOUCH DELICA LANCING DEV) MISC 1 Device by Does not apply route daily. 1 each 2   Lancets (ONETOUCH DELICA PLUS LANCET30G) MISC USE AS INSTRUCTED 100 each 12   levothyroxine  (SYNTHROID ) 112 MCG tablet Take 1 tablet (112 mcg total) by mouth daily. 30 tablet 11   losartan  (COZAAR ) 50 MG tablet TAKE 1 TABLET BY MOUTH EVERY DAY 90 tablet 1   metFORMIN  (GLUCOPHAGE ) 1000 MG tablet Take 1 tablet (1,000 mg total) by mouth 2 (two) times daily with a meal. (Patient taking differently: Take 1,500 mg by mouth 2 (two) times daily with a meal. 1000 mg in the morning 500 mg in the evening) 180 tablet 3   metoprolol  tartrate (LOPRESSOR ) 25 MG tablet Take 1 tablet (25 mg total) by mouth 2 (two) times daily. 90 tablet 1   ONETOUCH ULTRA test strip TEST BLOOD SUGAR 1-2 TIMES A DAY 100 strip 5   traMADol  (ULTRAM ) 50 MG tablet Take 1 tablet (50 mg total) by mouth every 12 (twelve) hours as needed for severe pain (pain score 7-10). 30 tablet 2   Glycopyrrolate-Formoterol  (BEVESPI  AEROSPHERE) 9-4.8 MCG/ACT AERO Inhale 2 puffs into the lungs 2 (two) times daily. (Patient not taking: Reported on 01/30/2024) 3 each 11    potassium chloride  SA (KLOR-CON  M) 20 MEQ tablet Take by mouth.     No facility-administered medications prior to visit.

## 2024-02-02 ENCOUNTER — Ambulatory Visit: Payer: Self-pay

## 2024-02-02 ENCOUNTER — Encounter: Payer: Self-pay | Admitting: Family Medicine

## 2024-02-02 ENCOUNTER — Ambulatory Visit (INDEPENDENT_AMBULATORY_CARE_PROVIDER_SITE_OTHER): Admitting: Family Medicine

## 2024-02-02 VITALS — BP 124/70 | HR 71 | Temp 98.2°F | Resp 20 | Ht 69.0 in | Wt 220.1 lb

## 2024-02-02 DIAGNOSIS — M1990 Unspecified osteoarthritis, unspecified site: Secondary | ICD-10-CM

## 2024-02-02 DIAGNOSIS — R252 Cramp and spasm: Secondary | ICD-10-CM

## 2024-02-02 LAB — CK: Total CK: 79 U/L (ref 7–177)

## 2024-02-02 LAB — BASIC METABOLIC PANEL WITH GFR
BUN: 11 mg/dL (ref 6–23)
CO2: 28 meq/L (ref 19–32)
Calcium: 9.4 mg/dL (ref 8.4–10.5)
Chloride: 101 meq/L (ref 96–112)
Creatinine, Ser: 0.62 mg/dL (ref 0.40–1.20)
GFR: 85.36 mL/min (ref >60.00)
Glucose, Bld: 144 mg/dL — ABNORMAL HIGH (ref 70–99)
Potassium: 4.1 meq/L (ref 3.5–5.1)
Sodium: 137 meq/L (ref 135–145)

## 2024-02-02 LAB — MAGNESIUM: Magnesium: 1.6 mg/dL (ref 1.5–2.5)

## 2024-02-02 NOTE — Patient Instructions (Addendum)
 It was a pleasure meeting you today. Thank you for allowing me to take part in your health care.  Our goals for today as we discussed include:  We will get some labs today.  If they are abnormal or we need to do something about them, I will call you.  If they are normal, I will send you a message on MyChart (if it is active) or a letter in the mail.  If you don't hear from us  in 2 weeks, please call the office at the number below.   Start Vitamin B supplements daily. This is over the counter  Increase water intake Continue to stay mobile.  Follow up with Orthopedics for your arthritis  Follow up with PCP in 1-2 weeks   This is a list of the screening recommended for you and due dates:  Health Maintenance  Topic Date Due   Zoster (Shingles) Vaccine (1 of 2) Never done   COVID-19 Vaccine (4 - 2024-25 season) 05/21/2023   Colon Cancer Screening  02/09/2024   Hemoglobin A1C  03/27/2024   Flu Shot  04/19/2024   Mammogram  05/01/2024   Medicare Annual Wellness Visit  05/02/2024   Complete foot exam   06/01/2024   Eye exam for diabetics  06/11/2024   Screening for Lung Cancer  09/28/2024   Yearly kidney function blood test for diabetes  12/05/2024   Yearly kidney health urinalysis for diabetes  01/24/2025   DTaP/Tdap/Td vaccine (2 - Td or Tdap) 05/17/2026   Pneumonia Vaccine  Completed   DEXA scan (bone density measurement)  Completed   Hepatitis C Screening  Completed   HPV Vaccine  Aged Out   Meningitis B Vaccine  Aged Out     If you have any questions or concerns, please do not hesitate to call the office at [phone].  I look forward to our next visit and until then take care and stay safe.  Regards,   Valli Gaw, MD   [facility]

## 2024-02-02 NOTE — Telephone Encounter (Signed)
 Pt was evaluated today,

## 2024-02-02 NOTE — Telephone Encounter (Signed)
  Chief Complaint: bilateral pain and cramping to the hips to the knees Symptoms: pain and burning, cannot sit down makes pain worse Frequency: 2 days Pertinent Negatives: Patient denies swelling Disposition: [] ED /[] Urgent Care (no appt availability in office) / [x] Appointment(In office/virtual)/ []  [facility]/ [] Home Care/ [] Refused Recommended Disposition /[] [facility]/ []  Follow-up with PCP Additional Notes: PCP's appt not until next week Copied from CRM #[id number]. Topic: Clinical - Red Word Triage >> Feb 02, 2024  8:02 AM Kita Perish H wrote: [facility] that prompted transfer to Nurse Triage: Couple of days back of legs hurt from back of knees to hips, can hardly stand or walk, only back portion of legs not front Reason for Disposition  [1] SEVERE pain (e.g., excruciating, unable to do any normal activities) AND [2] not improved after 2 hours of pain medicine  Answer Assessment - Initial Assessment Questions 1. ONSET: "When did the pain start?"      2 days  2. LOCATION: "Where is the pain located?"      Hips to knee 3. PAIN: "How bad is the pain?"    (Scale 1-10; or mild, moderate, severe)   -  MILD (1-3): doesn't interfere with normal activities    -  MODERATE (4-7): interferes with normal activities (e.g., work or school) or awakens from sleep, limping    -  SEVERE (8-10): excruciating pain, unable to do any normal activities, unable to walk     10/10 6. OTHER SYMPTOMS: "Do you have any other symptoms?" (e.g., chest pain, back pain, breathing difficulty, swelling, rash, fever, numbness, weakness)     Hurts to sit  Protocols used: Leg Pain-A-AH

## 2024-02-02 NOTE — Progress Notes (Signed)
 SUBJECTIVE:   Chief Complaint  Patient presents with   Leg Pain    Both legs from knee up to hips in the back X couple days  cramping bad   HPI Presents for acute visit  Discussed the use of AI scribe software for clinical note transcription with the patient, who gave verbal consent to proceed.  History of Present Illness Cynthia [name] is a 78 year old female who presents with severe pain in the back of her legs.  She has been experiencing severe throbbing pain in the back of her thighs for the past few days, without radiation to her hips. The pain is exacerbated by sitting, such as when using the toilet, and is not relieved by standing. She describes the pain as worse than a toothache and persistent despite her usual activities. No recent falls or injuries.  She manages the pain with tramadol  and Tylenol . She is also on potassium supplements, prescribed about four months ago, and questions whether this could be contributing to her symptoms. Her current medications include metoprolol , duloxetine  60 mg, and Lipitor, which she has been taking consistently. There have been no changes in her medications or any new medications that coincide with the onset of her symptoms.  She has a history of arthritis in her knee, for which she receives cortisone injections, but her knee is not currently bothering her. No numbness, tingling, or pain in her hips. Urinary and bowel functions are normal. She reports recent weight gain and acknowledges needing to lose some weight.     PERTINENT PMH / PSH: As above  OBJECTIVE:  BP 124/70   Pulse 71   Temp 98.2 F (36.8 C)   Resp 20   Ht 5\' 9"  (1.753 m)   Wt 220 lb 2 oz (99.8 kg)   SpO2 99%   BMI 32.51 kg/m    Physical Exam Vitals reviewed.  Constitutional:      General: She is not in acute distress.    Appearance: Normal appearance. She is obese. She is not ill-appearing, toxic-appearing or diaphoretic.  Eyes:     General:        Right  eye: No discharge.        Left eye: No discharge.     Conjunctiva/sclera: Conjunctivae normal.  Cardiovascular:     Rate and Rhythm: Normal rate and regular rhythm.     Heart sounds: Normal heart sounds.  Pulmonary:     Effort: Pulmonary effort is normal.     Breath sounds: Normal breath sounds.  Abdominal:     General: Bowel sounds are normal.  Musculoskeletal:        General: Normal range of motion.     Cervical back: Normal.     Thoracic back: Normal.     Lumbar back: Normal.     Right hip: No tenderness or bony tenderness. Normal strength.     Left hip: No tenderness or bony tenderness. Normal strength.     Right upper leg: Normal. No tenderness or bony tenderness.     Left upper leg: Normal. No tenderness or bony tenderness.  Skin:    General: Skin is warm and dry.  Neurological:     General: No focal deficit present.     Mental Status: She is alert and oriented to person, place, and time. Mental status is at baseline.  Psychiatric:        Mood and Affect: Mood normal.        Behavior: Behavior  normal.        Thought Content: Thought content normal.        Judgment: Judgment normal.           02/07/2024    9:41 AM 02/02/2024    9:32 AM 10/25/2023   11:40 AM 09/28/2023    9:31 AM 05/03/2023   10:26 AM  Depression screen PHQ 2/9  Decreased Interest 0 0 0 0 0  Down, Depressed, Hopeless 0 0 0 0 0  PHQ - 2 Score 0 0 0 0 0  Altered sleeping 0 0   3  Tired, decreased energy 0 0   0  Change in appetite 0 0   0  Feeling bad or failure about yourself  0 0   0  Trouble concentrating 0 0   0  Moving slowly or fidgety/restless 0 0   0  Suicidal thoughts 0 0   0  PHQ-9 Score 0 0   3  Difficult doing work/chores Not difficult at all Not difficult at all   Not difficult at all      02/07/2024    9:41 AM 10/21/2022    9:30 AM 10/05/2022   10:15 AM  GAD 7 : Generalized Anxiety Score  Nervous, Anxious, on Edge 0 0 0  Control/stop worrying 0 0 0  Worry too much - different  things 0 0 0  Trouble relaxing 0 0 0  Restless 0 0 0  Easily annoyed or irritable 0 0 0  Afraid - awful might happen 0 0 0  Total GAD 7 Score 0 0 0  Anxiety Difficulty Not difficult at all Not difficult at all Not difficult at all    ASSESSMENT/PLAN:  Muscle cramps Assessment & Plan: Chronic posterior thigh pain, worsened by sitting and standing, likely musculoskeletal, possibly related to weight gain. No numbness or tingling. Potassium intake discussed, no contributing factors identified. - Review blood work for potassium levels and kidney function. - Consider physical therapy referral for pain management. - Advise weight management to reduce leg pressure. - Continue tramadol  and acetaminophen  as needed.  Orders: -     Basic metabolic panel with GFR -     CK -     Magnesium  -     Vitamin B12  Arthritis Assessment & Plan: Knee arthritis managed with cortisone injections. Current leg pain unrelated to knee arthritis. - Continue follow-up with orthopedic specialist every six months. - Monitor for changes in knee symptoms.     PDMP reviewed  Return in about 2 weeks (around 02/16/2024) for PCP.  Valli Gaw, MD

## 2024-02-06 LAB — VITAMIN B12: Vitamin B-12: 170 pg/mL — ABNORMAL LOW (ref 211–911)

## 2024-02-07 ENCOUNTER — Ambulatory Visit: Payer: Self-pay

## 2024-02-07 ENCOUNTER — Ambulatory Visit (INDEPENDENT_AMBULATORY_CARE_PROVIDER_SITE_OTHER)

## 2024-02-07 ENCOUNTER — Encounter: Payer: Self-pay | Admitting: Family Medicine

## 2024-02-07 ENCOUNTER — Ambulatory Visit (INDEPENDENT_AMBULATORY_CARE_PROVIDER_SITE_OTHER): Admitting: Family Medicine

## 2024-02-07 VITALS — BP 114/68 | HR 78 | Temp 98.2°F | Resp 20 | Ht 69.0 in | Wt 220.0 lb

## 2024-02-07 DIAGNOSIS — M25551 Pain in right hip: Secondary | ICD-10-CM

## 2024-02-07 DIAGNOSIS — M25552 Pain in left hip: Secondary | ICD-10-CM | POA: Diagnosis not present

## 2024-02-07 DIAGNOSIS — M160 Bilateral primary osteoarthritis of hip: Secondary | ICD-10-CM | POA: Diagnosis not present

## 2024-02-07 DIAGNOSIS — M79604 Pain in right leg: Secondary | ICD-10-CM

## 2024-02-07 DIAGNOSIS — E538 Deficiency of other specified B group vitamins: Secondary | ICD-10-CM | POA: Diagnosis not present

## 2024-02-07 MED ORDER — NABUMETONE 500 MG PO TABS: 500.0000 mg | ORAL_TABLET | Freq: Every day | ORAL | 0 refills | Status: DC

## 2024-02-07 MED ORDER — OMEPRAZOLE 20 MG PO CPDR: 20.0000 mg | DELAYED_RELEASE_CAPSULE | Freq: Every day | ORAL | 0 refills | Status: DC

## 2024-02-07 NOTE — Patient Instructions (Addendum)
 It was a pleasure meeting you today. Thank you for allowing me to take part in your health care.  Our goals for today as we discussed include:  Hip xrays today  Start Relafen 500 mg daily as needed for pain. Take Omeprazole  20 mg daily when taking Relafan Referral sent to physical therapy Pending xray, may need to refer to Orthopedics  Stop Potassium supplements.  Last potassium 4.1 and you are not on any water pills.  Follow up with PCP as recommended  This is a list of the screening recommended for you and due dates:  Health Maintenance  Topic Date Due   Zoster (Shingles) Vaccine (1 of 2) Never done   COVID-19 Vaccine (4 - 2024-25 season) 05/21/2023   Colon Cancer Screening  02/09/2024   Hemoglobin A1C  03/27/2024   Flu Shot  04/19/2024   Mammogram  05/01/2024   Medicare Annual Wellness Visit  05/02/2024   Complete foot exam   06/01/2024   Eye exam for diabetics  06/11/2024   Screening for Lung Cancer  09/28/2024   Yearly kidney health urinalysis for diabetes  01/24/2025   Yearly kidney function blood test for diabetes  02/01/2025   DTaP/Tdap/Td vaccine (2 - Td or Tdap) 05/17/2026   Pneumonia Vaccine  Completed   DEXA scan (bone density measurement)  Completed   Hepatitis C Screening  Completed   HPV Vaccine  Aged Out   Meningitis B Vaccine  Aged Out      If you have any questions or concerns, please do not hesitate to call the office at [phone].  I look forward to our next visit and until then take care and stay safe.  Regards,   Valli Gaw, MD   [facility]

## 2024-02-07 NOTE — Progress Notes (Unsigned)
 SUBJECTIVE:   Chief Complaint  Patient presents with   Leg Pain   HPI Presents for acute visit  Discussed the use of AI scribe software for clinical note transcription with the patient, who gave verbal consent to proceed.  History of Present Illness Cynthia [name] is a 78 year old female with arthritis who presents with worsening leg pain and swelling.  She has been experiencing worsening pain in her right leg over the past week. The pain is sharp, radiating from her hip down to her leg, and is accompanied by swelling on the top of her right foot. The pain is severe enough at times to hinder her ability to stand and is exacerbated by coughing. It is most intense at night, making it difficult for her to get out of bed in the morning. No numbness or tingling in her legs, although she occasionally experiences numbness in her fingers.  She is currently managing her pain with Tylenol  during the day and tramadol  at night. Additionally, she applies an osteoarthritis cream on her knees. 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  She also has a history of high blood pressure and diabetes, managed with amlodipine  2.5 mg twice daily and metformin  1000 mg twice daily. She is also on Cymbalta  60 mg daily. She recalls a past episode of pneumonia, during which she was taken off a diuretic. She is not currently on any diuretics or steroids. Previous kidney function tests were normal, although she recalls having had calcium  in one of her kidneys in the past.     PERTINENT PMH / PSH: As above  OBJECTIVE:  BP 114/68   Pulse 78   Temp 98.2 F (36.8 C)   Resp 20   Ht 5\' 9"  (1.753 m)   Wt 220 lb (99.8 kg)   SpO2 98%   BMI 32.49 kg/m    Physical Exam Vitals reviewed.  Constitutional:      General: She is not in acute distress.    Appearance: Normal appearance. She is normal weight. She is not ill-appearing,  toxic-appearing or diaphoretic.  Eyes:     General:        Right eye: No discharge.        Left eye: No discharge.     Conjunctiva/sclera: Conjunctivae normal.  Cardiovascular:     Rate and Rhythm: Normal rate and regular rhythm.     Heart sounds: Normal heart sounds.  Pulmonary:     Effort: Pulmonary effort is normal.     Breath sounds: Normal breath sounds.  Abdominal:     General: Bowel sounds are normal.  Musculoskeletal:     Cervical back: Normal.     Thoracic back: Normal.     Lumbar back: Normal.     Right hip: Crepitus present. No tenderness or bony tenderness. Decreased range of motion. Normal strength.     Left hip: Crepitus present. No deformity, tenderness or bony tenderness. Decreased range of motion. Normal strength.     Right upper leg: Normal.     Left upper leg: Normal.  Skin:    General: Skin is warm and dry.  Neurological:     General: No focal deficit present.     Mental Status: She is alert and oriented to person, place, and time. Mental status is at baseline.  Psychiatric:        Mood and Affect: Mood normal.  Behavior: Behavior normal.        Thought Content: Thought content normal.        Judgment: Judgment normal.           02/07/2024    9:41 AM 02/02/2024    9:32 AM 10/25/2023   11:40 AM 09/28/2023    9:31 AM 05/03/2023   10:26 AM  Depression screen PHQ 2/9  Decreased Interest 0 0 0 0 0  Down, Depressed, Hopeless 0 0 0 0 0  PHQ - 2 Score 0 0 0 0 0  Altered sleeping 0 0   3  Tired, decreased energy 0 0   0  Change in appetite 0 0   0  Feeling bad or failure about yourself  0 0   0  Trouble concentrating 0 0   0  Moving slowly or fidgety/restless 0 0   0  Suicidal thoughts 0 0   0  PHQ-9 Score 0 0   3  Difficult doing work/chores Not difficult at all Not difficult at all   Not difficult at all      02/07/2024    9:41 AM 10/21/2022    9:30 AM 10/05/2022   10:15 AM  GAD 7 : Generalized Anxiety Score  Nervous, Anxious, on Edge 0 0 0   Control/stop worrying 0 0 0  Worry too much - different things 0 0 0  Trouble relaxing 0 0 0  Restless 0 0 0  Easily annoyed or irritable 0 0 0  Afraid - awful might happen 0 0 0  Total GAD 7 Score 0 0 0  Anxiety Difficulty Not difficult at all Not difficult at all Not difficult at all    ASSESSMENT/PLAN:  Bilateral hip pain Assessment & Plan: Suspect pain radiating from hips Xray bilateral hip positive for OA Refer to Ortho for evaluation Relafen 500 mg daily as needed for pain PPI for GI protection. Currently taking Tramadol  prescribed by PCP Refer to Physical therapy  Orders: -     DG HIPS BILAT W OR W/O PELVIS 3-4 VIEWS -     Nabumetone; Take 1 tablet (500 mg total) by mouth daily for 15 days.  Dispense: 15 tablet; Refill: 0 -     Ambulatory referral to Physical Therapy -     Omeprazole ; Take 1 capsule (20 mg total) by mouth daily for 15 days.  Dispense: 15 capsule; Refill: 0 -     Ambulatory referral to Orthopedic Surgery  Vitamin B 12 deficiency Assessment & Plan: Low Vitamin B12 level. Discussed switching to B12 injections if oral supplementation is ineffective. - Consider switching to B12 injections if oral supplementation is ineffective.  Orders: -     Vitamin B-12; Take 1 tablet (1,000 mcg total) by mouth daily.  Dispense: 90 tablet; Refill: 3    PDMP reviewed  Return in about 9 days (around 02/16/2024) for PCP.  Valli Gaw, MD

## 2024-02-11 ENCOUNTER — Encounter: Payer: Self-pay | Admitting: Family Medicine

## 2024-02-11 DIAGNOSIS — R252 Cramp and spasm: Secondary | ICD-10-CM | POA: Insufficient documentation

## 2024-02-11 NOTE — Assessment & Plan Note (Signed)
 Chronic posterior thigh pain, worsened by sitting and standing, likely musculoskeletal, possibly related to weight gain. No numbness or tingling. Potassium intake discussed, no contributing factors identified. - Review blood work for potassium levels and kidney function. - Consider physical therapy referral for pain management. - Advise weight management to reduce leg pressure. - Continue tramadol  and acetaminophen  as needed.

## 2024-02-11 NOTE — Assessment & Plan Note (Signed)
 Knee arthritis managed with cortisone injections. Current leg pain unrelated to knee arthritis. - Continue follow-up with orthopedic specialist every six months. - Monitor for changes in knee symptoms.

## 2024-02-12 ENCOUNTER — Encounter: Payer: Self-pay | Admitting: Family Medicine

## 2024-02-12 DIAGNOSIS — M25551 Pain in right hip: Secondary | ICD-10-CM | POA: Insufficient documentation

## 2024-02-12 DIAGNOSIS — E538 Deficiency of other specified B group vitamins: Secondary | ICD-10-CM | POA: Insufficient documentation

## 2024-02-12 MED ORDER — VITAMIN B-12 1000 MCG PO TABS: 1000.0000 ug | ORAL_TABLET | Freq: Every day | ORAL | 3 refills | Status: AC

## 2024-02-12 NOTE — Assessment & Plan Note (Signed)
 Suspect pain radiating from hips Xray bilateral hip positive for OA Refer to Ortho for evaluation Relafen 500 mg daily as needed for pain PPI for GI protection. Currently taking Tramadol  prescribed by PCP Refer to Physical therapy

## 2024-02-12 NOTE — Assessment & Plan Note (Signed)
 Low Vitamin B12 level. Discussed switching to B12 injections if oral supplementation is ineffective. - Consider switching to B12 injections if oral supplementation is ineffective.

## 2024-02-16 ENCOUNTER — Other Ambulatory Visit: Payer: Self-pay | Admitting: Family

## 2024-02-16 DIAGNOSIS — I10 Essential (primary) hypertension: Secondary | ICD-10-CM

## 2024-02-21 ENCOUNTER — Ambulatory Visit: Admitting: Family

## 2024-02-22 ENCOUNTER — Telehealth: Payer: Self-pay | Admitting: Family

## 2024-02-22 ENCOUNTER — Ambulatory Visit: Payer: Medicare Other | Admitting: Family

## 2024-02-22 ENCOUNTER — Telehealth: Payer: Self-pay

## 2024-02-22 ENCOUNTER — Encounter: Payer: Self-pay | Admitting: Family

## 2024-02-22 VITALS — BP 120/70 | HR 67 | Temp 98.1°F | Ht 69.0 in | Wt 223.6 lb

## 2024-02-22 DIAGNOSIS — M7989 Other specified soft tissue disorders: Secondary | ICD-10-CM | POA: Diagnosis not present

## 2024-02-22 DIAGNOSIS — M545 Low back pain, unspecified: Secondary | ICD-10-CM

## 2024-02-22 DIAGNOSIS — M25562 Pain in left knee: Secondary | ICD-10-CM

## 2024-02-22 DIAGNOSIS — E1122 Type 2 diabetes mellitus with diabetic chronic kidney disease: Secondary | ICD-10-CM

## 2024-02-22 DIAGNOSIS — M25552 Pain in left hip: Secondary | ICD-10-CM | POA: Diagnosis not present

## 2024-02-22 DIAGNOSIS — M25561 Pain in right knee: Secondary | ICD-10-CM | POA: Diagnosis not present

## 2024-02-22 DIAGNOSIS — E538 Deficiency of other specified B group vitamins: Secondary | ICD-10-CM

## 2024-02-22 DIAGNOSIS — Z7984 Long term (current) use of oral hypoglycemic drugs: Secondary | ICD-10-CM

## 2024-02-22 DIAGNOSIS — M25551 Pain in right hip: Secondary | ICD-10-CM

## 2024-02-22 DIAGNOSIS — G8929 Other chronic pain: Secondary | ICD-10-CM | POA: Diagnosis not present

## 2024-02-22 LAB — B12 AND FOLATE PANEL
Folate: 18.1 ng/mL (ref >5.9)
Vitamin B-12: 569 pg/mL (ref 211–911)

## 2024-02-22 LAB — HEMOGLOBIN A1C: Hgb A1c MFr Bld: 6.8 % — ABNORMAL HIGH (ref 4.6–6.5)

## 2024-02-22 NOTE — Telephone Encounter (Signed)
 Copied from CRM [id number]. Topic: Referral - Status >> Feb 22, 2024  2:48 PM Laquanda P wrote: Reason for CRM: PT advise ortho need written order to complete MRI

## 2024-02-22 NOTE — Telephone Encounter (Signed)
 Seen by Dr Krasinski 12/14/23 for right knee pain.   We need these notes  Call [facility]

## 2024-02-22 NOTE — Patient Instructions (Addendum)
 Increase tramadol  50mg  to twice daily  Continue compression stocking during the day for leg swelling  I have ordered MRI lumbar and sacral spine  Let us  know if you dont hear back within a week in regards to an appointment being scheduled.   So that you are aware, if you are [facility] MyChart user , please pay attention to your MyChart messages as you may receive a MyChart message with a phone number to call and schedule this test/appointment own your own from our referral coordinator. This is a new process so I do not want you to miss this message.  If you are not a MyChart user, you will receive a phone call.    Call Dr Krasinski to schedule follow up .    [facility] [address]  Monday-Friday 8am-9pm Saturday and Sunday 9am- 9pm   [phone]

## 2024-02-22 NOTE — Telephone Encounter (Signed)
 LVM @ [facility] ([phone]) to have someone call me back to send over ov notes

## 2024-02-22 NOTE — Progress Notes (Signed)
 Assessment & Plan:  Bilateral hip pain -     MR LUMBAR SPINE WO CONTRAST; Future -     MR SACRUM SI JOINTS WO CONTRAST; Future -     Hemoglobin A1c -     B12 and Folate Panel -     Lipid panel  Type 2 diabetes mellitus with diabetic chronic kidney disease, unspecified CKD stage, unspecified whether long term insulin  use (HCC) -     Hemoglobin A1c -     Lipid panel  B12 deficiency -     B12 and Folate Panel -     Intrinsic Factor Antibodies  Left-sided low back pain without sciatica, unspecified chronicity Assessment & Plan: Acute, uncontrolled.  Reviewed prior imaging.  Pending MRI lumbar and sacral spine. increase tramadol  to 50 mg twice daily.  Discontinue relafen  as ineffective for pain control. Continue Tylenol  arthritis.  Counseled on not exceeding 4 g in 24 hours.    Leg swelling Assessment & Plan: Chronic, stable.  Symptom improves with compression stockings.  Continue.  No overt symptoms to suggest CHF.  Previous CT lung cancer screen 09/2023 suggests enlarged cardiac silhouette.  Pending echocardiogram.  She continues to follow with Dr. Beau Bound, cardiology  Orders: -     ECHOCARDIOGRAM COMPLETE; Future  Chronic pain of both knees Assessment & Plan: Chronic, uncontrolled pain right knee.  Advised to follow-up with Dr. Krasinski.  Likely patient would benefit from steroid injection for now; last given 11/2023. She will call to schedule.       Return precautions given.   Risks, benefits, and alternatives of the medications and treatment plan prescribed today were discussed, and patient expressed understanding.   Education regarding symptom management and diagnosis given to patient on AVS either electronically or printed.  No follow-ups on file.  Bascom Bossier, FNP  Subjective:    Patient ID: Burke Carolus, female    DOB: 1945/10/03, 78 y.o.   MRN: [id number]  CC: MARKEA RUZICH is a 78 y.o. female who presents today for an acute visit.    HPI:  Complains bilateral knee pain which radiates to BL buttocks, 3 weeks, abrupt onset, pain is worsening  She does note that she sat in a 'hard chair' at sister's  beside while in the hospital and pain started a day later.   Most comfortable if she leans to left hip in the chair.   No fall , injury.    Pain is 'terrible' Hurts to sit on the commode. Pain is worse sitting in a chair. Pain eases when walking.   She has chronic right knee swelling  Denies f, chills, abdominal pain , constipation, saddle anesthesia, groin pain, intermittent claudication, urinary or fecal incontinence, sob, cp, orthopnea.   She complains of BL ankle swelling, worse at end of the day. Compression stockings improve symptom.        No relief with relafen   She is taking tramadol  50mg  every other day and the takes tylenol  arthritis 650mg  once daily to twice daily.   She has been called by PT however she has not scheduled.  Seen by Dr Krasinski 12/14/23 for right knee pain.   Compliant with b12 po otc started a couple of weeks ago  Compliant with Cymbalta  60 mg daily  H/o HTN, DM, left RCC  XR BL hip 02/07/24 BL hip degenerative arthritis XR lumbar 09/27/22 mild to moderate degenerative changes throughout  Seen 02/07/24 Dr Sueanne Emer[facility] Prescribed Relafen  500 mg daily, omeprazole  20mg  QD CK 79  B12 170  No CKD  FBG 98, 106 Compliant with metformin  1000mg  BID   Allergies: Patient has no known allergies. Current Outpatient Medications on File Prior to Visit  Medication Sig Dispense Refill   albuterol  (VENTOLIN  HFA) 108 (90 Base) MCG/ACT inhaler TAKE 2 PUFFS BY MOUTH EVERY 6 HOURS AS NEEDED FOR WHEEZE OR SHORTNESS OF BREATH 8.5 each 11   amLODipine  (NORVASC ) 2.5 MG tablet TAKE 2 TABLETS BY MOUTH EVERY DAY 90 tablet 1   aspirin  EC 81 MG tablet Take 81 mg by mouth daily. Swallow whole.     atorvastatin  (LIPITOR) 20 MG tablet TAKE 1 TABLET BY MOUTH EVERY DAY 90 tablet 3   blood glucose meter kit and supplies  KIT Dispense based on patient and insurance preference. Use up to four times daily as directed. 1 each 0   budeson-glycopyrrolate-formoterol  (BREZTRI  AEROSPHERE) 160-9-4.8 MCG/ACT AERO Inhale 2 puffs into the lungs in the morning and at bedtime. 3 each 3   Calcium  Carbonate-Vit D-Min (CALCIUM  600+D PLUS MINERALS) 600-400 MG-UNIT TABS Take 1 tablet by mouth daily.      cyanocobalamin  (VITAMIN B12) 1000 MCG tablet Take 1 tablet (1,000 mcg total) by mouth daily. 90 tablet 3   diclofenac  sodium (VOLTAREN ) 1 % GEL Apply 1 application  topically 4 (four) times daily as needed (knee pain.).     DULoxetine  (CYMBALTA ) 60 MG capsule TAKE 1 CAPSULE BY MOUTH EVERY DAY 90 capsule 3   Glycopyrrolate-Formoterol  (BEVESPI  AEROSPHERE) 9-4.8 MCG/ACT AERO Inhale 2 puffs into the lungs 2 (two) times daily. 3 each 11   Lancet Devices (ONE TOUCH DELICA LANCING DEV) MISC 1 Device by Does not apply route daily. 1 each 2   Lancets (ONETOUCH DELICA PLUS LANCET30G) MISC USE AS INSTRUCTED 100 each 12   levothyroxine  (SYNTHROID ) 112 MCG tablet Take 1 tablet (112 mcg total) by mouth daily. 30 tablet 11   losartan  (COZAAR ) 50 MG tablet TAKE 1 TABLET BY MOUTH EVERY DAY 90 tablet 1   metFORMIN  (GLUCOPHAGE ) 1000 MG tablet Take 1 tablet (1,000 mg total) by mouth 2 (two) times daily with a meal. (Patient taking differently: Take 1,500 mg by mouth 2 (two) times daily with a meal. 1000 mg in the morning 500 mg in the evening) 180 tablet 3   metoprolol  tartrate (LOPRESSOR ) 25 MG tablet Take 1 tablet (25 mg total) by mouth 2 (two) times daily. 90 tablet 1   ONETOUCH ULTRA test strip TEST BLOOD SUGAR 1-2 TIMES A DAY 100 strip 5   traMADol  (ULTRAM ) 50 MG tablet Take 1 tablet (50 mg total) by mouth every 12 (twelve) hours as needed for severe pain (pain score 7-10). 30 tablet 2   No current facility-administered medications on file prior to visit.    Review of Systems  Constitutional:  Negative for chills and fever.  Respiratory:   Negative for cough and shortness of breath.   Cardiovascular:  Positive for leg swelling. Negative for chest pain and palpitations.  Gastrointestinal:  Negative for nausea and vomiting.  Musculoskeletal:  Positive for arthralgias.  Neurological:  Negative for numbness.      Objective:    BP 120/70   Pulse 67   Temp 98.1 F (36.7 C) (Oral)   Ht 5' 9 (1.753 m)   Wt 223 lb 9.6 oz (101.4 kg)   SpO2 97%   BMI 33.02 kg/m   BP Readings from Last 3 Encounters:  02/22/24 120/70  02/07/24 114/68  02/02/24 124/70   Wt Readings from Last 3  Encounters:  02/22/24 223 lb 9.6 oz (101.4 kg)  02/07/24 220 lb (99.8 kg)  02/02/24 220 lb 2 oz (99.8 kg)    Physical Exam Vitals reviewed.  Constitutional:      Appearance: She is well-developed.  Eyes:     Conjunctiva/sclera: Conjunctivae normal.  Cardiovascular:     Rate and Rhythm: Normal rate and regular rhythm.     Pulses: Normal pulses.     Heart sounds: Normal heart sounds.     Comments: +1 non pitting BLE edema. No palpable cords or masses. No erythema or increased warmth. No asymmetry in calf size when compared bilaterally LE hair growth symmetric and present. No discoloration or varicosities noted. LE warm and palpable pedal pulses.  Pulmonary:     Effort: Pulmonary effort is normal.     Breath sounds: Normal breath sounds. No wheezing, rhonchi or rales.  Musculoskeletal:       Arms:     Lumbar back: No swelling, edema, spasms, tenderness or bony tenderness. Normal range of motion.     Right lower leg: 1+ Edema present.     Left lower leg: No edema.     Comments: Diffuse tenderness over low back and spine .  No edema, erythema, bony step-off over the spine or surrounding tissue.  full range of motion with flexion, tension, lateral side bends. No bony tenderness. No pain, numbness, tingling elicited with single leg raise bilaterally.   Skin:    General: Skin is warm and dry.  Neurological:     Mental Status: She is alert.      Sensory: No sensory deficit.     Deep Tendon Reflexes:     Reflex Scores:      Patellar reflexes are 2+ on the right side and 2+ on the left side.    Comments: Sensation and strength intact bilateral lower extremities.  Psychiatric:        Speech: Speech normal.        Behavior: Behavior normal.        Thought Content: Thought content normal.

## 2024-02-23 LAB — LIPID PANEL
Cholesterol: 169 mg/dL (ref 0–200)
HDL: 75.6 mg/dL (ref >39.00)
LDL Cholesterol: 80 mg/dL (ref 0–99)
NonHDL: 93.3
Total CHOL/HDL Ratio: 2
Triglycerides: 69 mg/dL (ref 0.0–149.0)
VLDL: 13.8 mg/dL (ref 0.0–40.0)

## 2024-02-24 ENCOUNTER — Ambulatory Visit: Payer: Self-pay | Admitting: Family

## 2024-02-24 LAB — INTRINSIC FACTOR ANTIBODIES: Intrinsic Factor: NEGATIVE

## 2024-02-26 NOTE — Telephone Encounter (Signed)
 Spoke to [place] @ [facility], she stated that she would fax over ov notes from 12/14/23 fax # was given

## 2024-02-28 NOTE — Assessment & Plan Note (Addendum)
 Chronic, uncontrolled pain right knee.  Advised to follow-up with Dr. Krasinski.  Likely patient would benefit from steroid injection for now; last given 11/2023. She will call to schedule.

## 2024-02-28 NOTE — Telephone Encounter (Signed)
 Mri scheduled tomorrow This message is unclear Please call pt to get more info  Please also advise her I ordered ultrasound of her heart due to leg swelling.  This was last done in 2018.  I will share this with Dr. Beau Bound as well.

## 2024-02-28 NOTE — Assessment & Plan Note (Addendum)
 Chronic, stable.  Symptom improves with compression stockings.  Continue.  No overt symptoms to suggest CHF.  Previous CT lung cancer screen 09/2023 suggests enlarged cardiac silhouette.  Pending echocardiogram.  She continues to follow with Dr. Beau Bound, cardiology

## 2024-02-28 NOTE — Telephone Encounter (Signed)
 Spoke to [facility] radiology and they stated that pt is all clear to go nd does not need MRI orders signed

## 2024-02-28 NOTE — Assessment & Plan Note (Addendum)
 Acute, uncontrolled.  Reviewed prior imaging.  Pending MRI lumbar and sacral spine. increase tramadol  to 50 mg twice daily.  Discontinue relafen  as ineffective for pain control. Continue Tylenol  arthritis.  Counseled on not exceeding 4 g in 24 hours.

## 2024-02-28 NOTE — Telephone Encounter (Signed)
 Spoke to pt she is ok with getting US  done she does need the MRI orders signed in order to get MRI done tomorrow

## 2024-02-29 ENCOUNTER — Ambulatory Visit
Admission: RE | Admit: 2024-02-29 | Discharge: 2024-02-29 | Disposition: A | Discharge status: Home / Self Care | Source: Ambulatory Visit | Attending: Family | Admitting: Family

## 2024-02-29 DIAGNOSIS — M25552 Pain in left hip: Secondary | ICD-10-CM | POA: Insufficient documentation

## 2024-02-29 DIAGNOSIS — M129 Arthropathy, unspecified: Secondary | ICD-10-CM | POA: Diagnosis not present

## 2024-02-29 DIAGNOSIS — M25551 Pain in right hip: Secondary | ICD-10-CM | POA: Diagnosis not present

## 2024-02-29 DIAGNOSIS — M48061 Spinal stenosis, lumbar region without neurogenic claudication: Secondary | ICD-10-CM | POA: Diagnosis not present

## 2024-02-29 DIAGNOSIS — M4726 Other spondylosis with radiculopathy, lumbar region: Secondary | ICD-10-CM | POA: Diagnosis not present

## 2024-02-29 DIAGNOSIS — M4807 Spinal stenosis, lumbosacral region: Secondary | ICD-10-CM | POA: Diagnosis not present

## 2024-02-29 DIAGNOSIS — M5416 Radiculopathy, lumbar region: Secondary | ICD-10-CM | POA: Diagnosis not present

## 2024-02-29 DIAGNOSIS — M545 Low back pain, unspecified: Secondary | ICD-10-CM | POA: Diagnosis not present

## 2024-02-29 DIAGNOSIS — M5117 Intervertebral disc disorders with radiculopathy, lumbosacral region: Secondary | ICD-10-CM | POA: Diagnosis not present

## 2024-03-01 ENCOUNTER — Telehealth: Payer: Self-pay

## 2024-03-01 NOTE — Telephone Encounter (Signed)
 Patient returned call to office and I let the Patient know the intrinsic factor is negative. Patient confirmed she is still taking B12 oral supplement.

## 2024-03-03 ENCOUNTER — Other Ambulatory Visit: Payer: Self-pay | Admitting: Family

## 2024-03-03 DIAGNOSIS — I10 Essential (primary) hypertension: Secondary | ICD-10-CM

## 2024-03-04 ENCOUNTER — Telehealth: Payer: Self-pay

## 2024-03-04 ENCOUNTER — Other Ambulatory Visit: Payer: Self-pay | Admitting: Family

## 2024-03-04 DIAGNOSIS — E785 Hyperlipidemia, unspecified: Secondary | ICD-10-CM

## 2024-03-04 DIAGNOSIS — M545 Low back pain, unspecified: Secondary | ICD-10-CM

## 2024-03-04 NOTE — Telephone Encounter (Signed)
 Copied from CRM [id number]. Topic: Clinical - Lab/Test Results >> Mar 04, 2024  9:54 AM Cynthia [name] wrote: Reason for CRM: Patient is calling in for her MRI results. Please contact the patient when the results are ready.

## 2024-03-04 NOTE — Telephone Encounter (Unsigned)
 Copied from CRM [id number]. Topic: Clinical - Medication Refill >> Mar 04, 2024  9:52 AM Baldo Levan wrote: Medication:  traMADol  traMADol  (ULTRAM ) 50 MG tablet   Has the patient contacted their pharmacy? Yes (Agent: If no, request that the patient contact the pharmacy for the refill. If patient does not wish to contact the pharmacy document the reason why and proceed with request.) (Agent: If yes, when and what did the pharmacy advise?)  This is the patient's preferred pharmacy:  [facility] [address], [place] - [address] [place] [address] Phone: [phone] Fax: [phone]  Is this the correct pharmacy for this prescription? Yes If no, delete pharmacy and type the correct one.   Has the prescription been filled recently? No  Is the patient out of the medication? Yes- Patient has two left  Has the patient been seen for an appointment in the last year OR does the patient have an upcoming appointment? Yes  Can we respond through MyChart? No  Agent: Please be advised that Rx refills may take up to 3 business days. We ask that you follow-up with your pharmacy.

## 2024-03-04 NOTE — Telephone Encounter (Signed)
 Refilled: 08/01/2023 Last OV: 02/22/2024 Next OV: not scheduled

## 2024-03-05 ENCOUNTER — Other Ambulatory Visit: Payer: Self-pay | Admitting: Family

## 2024-03-05 DIAGNOSIS — M545 Low back pain, unspecified: Secondary | ICD-10-CM

## 2024-03-05 MED ORDER — TRAMADOL HCL 50 MG PO TABS: 50.0000 mg | ORAL_TABLET | Freq: Two times a day (BID) | ORAL | 2 refills | Status: DC | PRN

## 2024-03-05 NOTE — Telephone Encounter (Signed)
Spoke to pt regarding MRI results

## 2024-03-05 NOTE — Telephone Encounter (Signed)
 Spoke to pt and gave her number to [facility] radiology and informed her to call to get them to burn  a CD of her MRI for her appt on Friday at [facility]

## 2024-03-05 NOTE — Telephone Encounter (Unsigned)
 Copied from CRM [id number]. Topic: General - Other >> Mar 05, 2024  9:26 AM Albertha Alosa wrote: Reason for CRM: Patient called in wanting to speak to [name] , would like for her to give her a callback regarding the ortho , stated they have not gotten the CD need to know what to do

## 2024-03-08 DIAGNOSIS — M5416 Radiculopathy, lumbar region: Secondary | ICD-10-CM | POA: Diagnosis not present

## 2024-03-08 DIAGNOSIS — M4807 Spinal stenosis, lumbosacral region: Secondary | ICD-10-CM | POA: Diagnosis not present

## 2024-03-11 ENCOUNTER — Ambulatory Visit (INDEPENDENT_AMBULATORY_CARE_PROVIDER_SITE_OTHER): Admitting: Podiatry

## 2024-03-11 DIAGNOSIS — Z91198 Patient's noncompliance with other medical treatment and regimen for other reason: Secondary | ICD-10-CM

## 2024-03-11 NOTE — Progress Notes (Signed)
 1. Failure to attend appointment with reason given    Patient rescheduled appointment.

## 2024-03-18 ENCOUNTER — Ambulatory Visit: Admitting: Podiatry

## 2024-03-18 ENCOUNTER — Encounter: Payer: Self-pay | Admitting: Podiatry

## 2024-03-18 ENCOUNTER — Telehealth: Payer: Self-pay

## 2024-03-18 DIAGNOSIS — B351 Tinea unguium: Secondary | ICD-10-CM | POA: Diagnosis not present

## 2024-03-18 DIAGNOSIS — M79675 Pain in left toe(s): Secondary | ICD-10-CM | POA: Diagnosis not present

## 2024-03-18 DIAGNOSIS — E1140 Type 2 diabetes mellitus with diabetic neuropathy, unspecified: Secondary | ICD-10-CM | POA: Diagnosis not present

## 2024-03-18 DIAGNOSIS — M79674 Pain in right toe(s): Secondary | ICD-10-CM | POA: Diagnosis not present

## 2024-03-18 NOTE — Progress Notes (Signed)
.  [email or web address]  Subjective:  Patient ID: Cynthia [name], female    DOB: 03/02/1946,  MRN: [id number]  Cynthia [name] presents to clinic today for at risk foot care with history of diabetic neuropathy and painful thick toenails that are difficult to trim. Pain interferes with ambulation. Aggravating factors include wearing enclosed shoe gear. Pain is relieved with periodic professional debridement.  Chief Complaint  Patient presents with   [facility]    Rm4 DFC/ Diabetic blood sugar 115/ A1c 6.8/Dr. Dineen last visit June 2025   New problem(s): None.   PCP is Arnett, Rollene MATSU, FNP.  No Known Allergies  Review of Systems: Negative except as noted in the HPI.  Objective: No changes noted in today's physical examination. There were no vitals filed for this visit. TIRSA GAIL is a pleasant 78 y.o. female in NAD. AAO x 3.  Vascular Examination: Capillary refill time immediate b/l. Palpable pedal pulses. Pedal hair decreased b/l. No pain with calf compression b/l. Skin temperature gradient WNL b/l. No cyanosis or clubbing b/l. No ischemia or gangrene noted b/l. No edema noted b/l LE.  Neurological Examination: Sensation grossly intact b/l with 10 gram monofilament. Vibratory sensation intact b/l. Pt has subjective symptoms of neuropathy.  Dermatological Examination: Pedal skin with normal turgor, texture and tone b/l.  No open wounds. No interdigital macerations.   Toenails 1-5 b/l thick, discolored, elongated with subungual debris and pain on dorsal palpation.   No corns, calluses nor porokeratotic lesions noted.  Musculoskeletal Examination: Muscle strength 5/5 to all lower extremity muscle groups bilaterally. No pain, crepitus or joint limitation noted with ROM b/l LE. No gross bony pedal deformities b/l. Patient ambulates with cane assistance.  Radiographs: None  Last A1c:      Latest Ref Rng & Units 02/22/2024   10:25 AM 09/28/2023   10:12 AM 06/28/2023   11:01 AM  Hemoglobin A1C   Hemoglobin-A1c 4.6 - 6.5 % 6.8  7.0  6.5    Assessment/Plan: 1. Pain due to onychomycosis of toenails of both feet   2. Type 2 diabetes mellitus with diabetic neuropathy, without long-term current use of insulin  [facility])   Consent given for treatment. Patient examined. All patient's and/or POA's questions/concerns addressed on today's visit. Toenails 1-5 debrided in length and girth without incident. Continue foot and shoe inspections daily. Monitor blood glucose per PCP/Endocrinologist's recommendations. Continue soft, supportive shoe gear daily. Report any pedal injuries to medical professional. Call office if there are any questions/concerns. Return in about 3 months (around 06/18/2024).  Delon LITTIE Merlin, DPM      [place] LOCATION: [address] [place] [address]                   Office [phone]   [facility] LOCATION: [address] [place], [place] [address] Office [phone]

## 2024-03-18 NOTE — Telephone Encounter (Signed)
 Patient walked in stating needing excuse letter for [employer] completed as soon as possible.

## 2024-03-19 DIAGNOSIS — C642 Malignant neoplasm of left kidney, except renal pelvis: Secondary | ICD-10-CM | POA: Diagnosis not present

## 2024-03-19 DIAGNOSIS — J449 Chronic obstructive pulmonary disease, unspecified: Secondary | ICD-10-CM | POA: Diagnosis not present

## 2024-03-19 DIAGNOSIS — E1122 Type 2 diabetes mellitus with diabetic chronic kidney disease: Secondary | ICD-10-CM | POA: Diagnosis not present

## 2024-03-19 DIAGNOSIS — R809 Proteinuria, unspecified: Secondary | ICD-10-CM | POA: Diagnosis not present

## 2024-03-19 DIAGNOSIS — I10 Essential (primary) hypertension: Secondary | ICD-10-CM | POA: Diagnosis not present

## 2024-03-19 DIAGNOSIS — R609 Edema, unspecified: Secondary | ICD-10-CM | POA: Diagnosis not present

## 2024-03-19 DIAGNOSIS — N1831 Chronic kidney disease, stage 3a: Secondary | ICD-10-CM | POA: Diagnosis not present

## 2024-03-19 DIAGNOSIS — E871 Hypo-osmolality and hyponatremia: Secondary | ICD-10-CM | POA: Diagnosis not present

## 2024-03-19 DIAGNOSIS — K219 Gastro-esophageal reflux disease without esophagitis: Secondary | ICD-10-CM | POA: Diagnosis not present

## 2024-03-19 NOTE — Telephone Encounter (Signed)
 Pt needs letter by 7/17. Jury duty is 7/24.

## 2024-03-21 NOTE — Telephone Encounter (Signed)
Pt has an appt for 07/15.

## 2024-03-25 DIAGNOSIS — E119 Type 2 diabetes mellitus without complications: Secondary | ICD-10-CM | POA: Diagnosis not present

## 2024-03-25 DIAGNOSIS — M3501 Sicca syndrome with keratoconjunctivitis: Secondary | ICD-10-CM | POA: Diagnosis not present

## 2024-03-25 DIAGNOSIS — H2513 Age-related nuclear cataract, bilateral: Secondary | ICD-10-CM | POA: Diagnosis not present

## 2024-03-25 DIAGNOSIS — H2000 Unspecified acute and subacute iridocyclitis: Secondary | ICD-10-CM | POA: Diagnosis not present

## 2024-03-26 NOTE — Telephone Encounter (Unsigned)
 Copied from CRM [id number]. Topic: General - Other >> Mar 26, 2024  9:13 AM Thersia BROCKS wrote: Reason for CRM: Patient called in wanted to know if she could canceled the appointment on 07/15 and just have FNP Rollene Northern fill out the paperwork or she needs to be seen to get those forms filled, would like for someone to give her a callback regarding this

## 2024-03-26 NOTE — Telephone Encounter (Signed)
 Spoke to pt regarding jury duty note, she will come by to pick it up on 03/27/24 and  would also like to know if she should continue going to Ortho or should she be going to [facility]

## 2024-03-27 NOTE — Telephone Encounter (Signed)
 Called [facility] they are faxing ov notes over to us  fax number was given also. Pt has been notified

## 2024-03-28 ENCOUNTER — Telehealth: Payer: Self-pay | Admitting: Family

## 2024-03-28 NOTE — Telephone Encounter (Signed)
 Copied from CRM [id number]. Topic: Clinical - Medication Refill >> Mar 28, 2024  9:24 AM Burnard DEL wrote: Medication: Potassium pills(did not know the name  Has the patient contacted their pharmacy? No (Agent: If no, request that the patient contact the pharmacy for the refill. If patient does not wish to contact the pharmacy document the reason why and proceed with request.) (Agent: If yes, when and what did the pharmacy advise?)  This is the patient's preferred pharmacy:  [facility] [address] [place] - [address] [place] [address] Phone: [phone] Fax: [phone]  Is this the correct pharmacy for this prescription? Yes If no, delete pharmacy and type the correct one.   Has the prescription been filled recently? No  Is the patient out of the medication? Yes  Has the patient been seen for an appointment in the last year OR does the patient have an upcoming appointment? Yes  Can we respond through MyChart? No  Agent: Please be advised that Rx refills may take up to 3 business days. We ask that you follow-up with your pharmacy.

## 2024-03-29 NOTE — Telephone Encounter (Signed)
 Call pt Reviewing note with cardiology dr florencio She had been on KLOR-CON  M20 20 mEq ER tablet Take 20 mEq by mouth once daily   Please confirm that she has been taking this medication daily for the past couple months.  Her potassium has been normal.    Please refill if she has been taking per above

## 2024-03-29 NOTE — Telephone Encounter (Signed)
 LVM to call back to office to discuss message below  Call pt Reviewing note with cardiology dr florencio She had been on KLOR-CON  M20 20 mEq ER tablet Take 20 mEq by mouth once daily    Please confirm that she has been taking this medication daily for the past couple months.  Her potassium has been normal.     Please refill if she has been taking per above

## 2024-04-01 ENCOUNTER — Telehealth: Payer: Self-pay | Admitting: Family

## 2024-04-01 MED ORDER — POTASSIUM CHLORIDE CRYS ER 20 MEQ PO TBCR
20.0000 meq | EXTENDED_RELEASE_TABLET | Freq: Every day | ORAL | 3 refills | Status: DC
Start: 2024-04-01 — End: 2024-07-29

## 2024-04-01 NOTE — Telephone Encounter (Signed)
 Spoke to pt addressed concerns that she had

## 2024-04-01 NOTE — Telephone Encounter (Unsigned)
 Copied from CRM [id number]. Topic: General - Other >> Apr 01, 2024 10:02 AM Lavanda D wrote: Reason for CRM: Patient is returning [name]'s phone call, please call back when available.

## 2024-04-01 NOTE — Telephone Encounter (Signed)
 LVM to call back to address pt phone call

## 2024-04-01 NOTE — Telephone Encounter (Signed)
 Copied from CRM [id number]. Topic: General - Call Back - No Documentation >> Apr 01, 2024  9:11 AM Avram MATSU wrote: Reason for CRM: patient would like a callback from her providers nurse jenate [phone]

## 2024-04-01 NOTE — Telephone Encounter (Signed)
 Spoke to pt she had been taking until they ran out, rx sent in to pt preferred pharmacy

## 2024-04-02 ENCOUNTER — Ambulatory Visit: Admitting: Family

## 2024-04-04 DIAGNOSIS — M48062 Spinal stenosis, lumbar region with neurogenic claudication: Secondary | ICD-10-CM | POA: Diagnosis not present

## 2024-04-04 DIAGNOSIS — M5126 Other intervertebral disc displacement, lumbar region: Secondary | ICD-10-CM | POA: Diagnosis not present

## 2024-04-04 DIAGNOSIS — M47816 Spondylosis without myelopathy or radiculopathy, lumbar region: Secondary | ICD-10-CM | POA: Diagnosis not present

## 2024-04-08 NOTE — Telephone Encounter (Unsigned)
 Copied from CRM [id number]. Topic: Clinical - Medical Advice >> Apr 08, 2024  9:41 AM Larissa RAMAN wrote: Reason for CRM: Patient  is requesting a callback from nurse/provider to determine if she should see her cardiologist before having an injection in her back.  Callback [phone]

## 2024-04-09 NOTE — Telephone Encounter (Addendum)
 Called pt and informed her that I Spoke to [facility] @ [facility] she stated that pt has injection scheduled for 04/17/24 @ 3 pm she was sending message to Dr Marchia to see if pt needs Cardiac clearance or not, left my name and number for someone to call me back to confirm or not , I will reach back out to pt and let her know as soon as they reach out to me

## 2024-04-15 ENCOUNTER — Encounter: Payer: Self-pay | Admitting: Family

## 2024-04-15 ENCOUNTER — Ambulatory Visit (INDEPENDENT_AMBULATORY_CARE_PROVIDER_SITE_OTHER): Admitting: Family

## 2024-04-15 VITALS — BP 128/76 | HR 78 | Temp 98.7°F | Ht 68.0 in | Wt 217.0 lb

## 2024-04-15 DIAGNOSIS — R490 Dysphonia: Secondary | ICD-10-CM | POA: Diagnosis not present

## 2024-04-15 DIAGNOSIS — E785 Hyperlipidemia, unspecified: Secondary | ICD-10-CM

## 2024-04-15 DIAGNOSIS — I10 Essential (primary) hypertension: Secondary | ICD-10-CM

## 2024-04-15 DIAGNOSIS — E876 Hypokalemia: Secondary | ICD-10-CM | POA: Diagnosis not present

## 2024-04-15 DIAGNOSIS — M545 Low back pain, unspecified: Secondary | ICD-10-CM | POA: Diagnosis not present

## 2024-04-15 LAB — BASIC METABOLIC PANEL WITH GFR
BUN: 8 mg/dL (ref 6–23)
CO2: 27 meq/L (ref 19–32)
Calcium: 10.1 mg/dL (ref 8.4–10.5)
Chloride: 102 meq/L (ref 96–112)
Creatinine, Ser: 0.58 mg/dL (ref 0.40–1.20)
GFR: 86.62 mL/min (ref >60.00)
Glucose, Bld: 85 mg/dL (ref 70–99)
Potassium: 4.4 meq/L (ref 3.5–5.1)
Sodium: 137 meq/L (ref 135–145)

## 2024-04-15 LAB — MAGNESIUM: Magnesium: 1.4 mg/dL — ABNORMAL LOW (ref 1.5–2.5)

## 2024-04-15 MED ORDER — TIZANIDINE HCL 2 MG PO CAPS: 2.0000 mg | ORAL_CAPSULE | Freq: Every evening | ORAL | 1 refills | Status: DC | PRN

## 2024-04-15 MED ORDER — AMLODIPINE BESYLATE 2.5 MG PO TABS: 2.5000 mg | ORAL_TABLET | Freq: Every day | ORAL | 1 refills | Status: DC

## 2024-04-15 MED ORDER — AZELASTINE HCL 0.1 % NA SOLN: 1.0000 | Freq: Two times a day (BID) | NASAL | 4 refills | Status: AC

## 2024-04-15 NOTE — Patient Instructions (Addendum)
 Start muscle relaxant at bedtime, tizanidine  for leg cramps and back pain  Do not drive or operate heavy machinery while on muscle relaxant. Please do not drink alcohol. Only take this medication as needed for acute muscle spasm at bedtime. This medication make you feel drowsy so be very careful.  Stop taking if become too drowsy or somnolent as this puts you at risk for falls. Please contact our office with any questions.    May stop amlodipine  entirely to reduce leg swelling.   Start azelastine  nasal spray ( antihistamine) for hoarseness

## 2024-04-15 NOTE — Progress Notes (Unsigned)
 Assessment & Plan:  Hoarseness Assessment & Plan: Concern for PND. Start azelastine . Consider trial of pepcid. Close followup.   Orders: -     Azelastine  HCl; Place 1 spray into both nostrils 2 (two) times daily. 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 Continue tramadol  50mg  BID, tylenol  650mg  BID.ESI scheduled 04/04/24.  Provided prescription of tizanidine  to use at bedtime for back pain/ cramping; of note , trial suspension of statin as well.   Orders: -     tiZANidine  HCl; Take 1 capsule (2 mg total) by mouth at bedtime as needed for muscle spasms.  Dispense: 30 capsule; Refill: 1  Primary hypertension Assessment & Plan: Well controlled. Suspect BLE edema likely from amlodipine . Trial stop amlodipine  2.5mg  with close follow up to recheck blood pressure.  Continue losartan  25mg  , lopressor  25mg  BID   Hyperlipidemia, unspecified hyperlipidemia type Assessment & Plan: Lab Results  Component Value Date   LDLCALC 80 02/22/2024   Concern that leg cramping is causing myalgia; she is also scheduled for [facility] 04/04/24.  Advised trial cessation of lipitor 20mg  x 2 weeks. Close follow up        Return precautions given.   Risks, benefits, and alternatives of the medications and treatment plan prescribed today were discussed, and patient expressed understanding.   Education regarding symptom management and diagnosis given to patient on AVS either electronically or printed.  Return in about 1 week (around 04/22/2024).  Rollene Northern, FNP  Subjective:    Patient ID: Cynthia [name], female    DOB: 1946-07-28, 78 y.o.   MRN: [id number]  CC: Cynthia [name] is a 78 y.o. female who presents today for an acute  visit.    HPI: Complains of hoarseness x 4 days, unchanged.   No pain with swallowing, sinus drainage, fever, chills, ear pressure, CP, sob , wheezing, epigastric burning.   Yesterday she started to cough when food 'when down the wrong way' , almost choking, such as with blooming onion, which resolved will water, cough drop.   No trouble swallowing pills, meat, bread, cold items.   She has tried lemon water without relief.   No sick contacts, recent travel     She has upcoming injection of her back with Dr Etta, 04/17/24 . She complains of cramping in her low back and legs. Worse at night. She takes tramadol  50mg  BID, tylenol  650mg  BID.   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   A1c 6.8 02/22/24  Complains of BLE swelling. She is taking amlodipine  2.5mg  every day   Allergies: Patient has no known allergies. Current Outpatient Medications on File Prior to Visit  Medication Sig Dispense Refill   albuterol  (VENTOLIN  HFA) 108 (90 Base) MCG/ACT inhaler TAKE 2 PUFFS BY MOUTH EVERY 6 HOURS AS NEEDED FOR WHEEZE OR SHORTNESS OF BREATH 8.5 each 11   aspirin  EC 81 MG tablet Take 81 mg by mouth daily. Swallow whole.     atorvastatin  (LIPITOR) 20 MG tablet TAKE 1 TABLET BY MOUTH EVERY DAY 90 tablet 1   blood glucose meter kit and supplies KIT Dispense based on patient and insurance preference.  Use up to four times daily as directed. 1 each 0   budeson-glycopyrrolate-formoterol  (BREZTRI  AEROSPHERE) 160-9-4.8 MCG/ACT AERO Inhale 2 puffs into the lungs in the morning and at bedtime. 3 each 3   Calcium  Carbonate-Vit D-Min (CALCIUM  600+D PLUS MINERALS) 600-400 MG-UNIT TABS Take 1 tablet by mouth daily.      cyanocobalamin  (VITAMIN B12) 1000 MCG tablet Take 1 tablet (1,000 mcg total) by mouth daily. 90 tablet 3   diclofenac  sodium (VOLTAREN ) 1 % GEL Apply 1 application  topically 4 (four) times daily  as needed (knee pain.).     DULoxetine  (CYMBALTA ) 60 MG capsule TAKE 1 CAPSULE BY MOUTH EVERY DAY 90 capsule 3   Glycopyrrolate-Formoterol  (BEVESPI  AEROSPHERE) 9-4.8 MCG/ACT AERO Inhale 2 puffs into the lungs 2 (two) times daily. 3 each 11   Lancet Devices (ONE TOUCH DELICA LANCING DEV) MISC 1 Device by Does not apply route daily. 1 each 2   Lancets (ONETOUCH DELICA PLUS LANCET30G) MISC USE AS INSTRUCTED 100 each 12   levothyroxine  (SYNTHROID ) 112 MCG tablet Take 1 tablet (112 mcg total) by mouth daily. 30 tablet 11   losartan  (COZAAR ) 50 MG tablet TAKE 1 TABLET BY MOUTH EVERY DAY 90 tablet 1   metFORMIN  (GLUCOPHAGE ) 1000 MG tablet Take 1 tablet (1,000 mg total) by mouth 2 (two) times daily with a meal. (Patient taking differently: Take 1,500 mg by mouth 2 (two) times daily with a meal. 1000 mg in the morning 500 mg in the evening) 180 tablet 3   metoprolol  tartrate (LOPRESSOR ) 25 MG tablet Take 1 tablet (25 mg total) by mouth 2 (two) times daily. 90 tablet 1   nabumetone  (RELAFEN ) 500 MG tablet TAKE 1 TABLET (500 MG TOTAL) BY MOUTH DAILY FOR 15 DAYS.     ONETOUCH ULTRA test strip TEST BLOOD SUGAR 1-2 TIMES A DAY 100 strip 5   potassium chloride  SA (KLOR-CON  M) 20 MEQ tablet Take 1 tablet (20 mEq total) by mouth daily. 30 tablet 3   traMADol  (ULTRAM ) 50 MG tablet Take 1 tablet (50 mg total) by mouth every 12 (twelve) hours as needed for severe pain (pain score 7-10). 60 tablet 2   No current facility-administered medications on file prior to visit.    Review of Systems  Constitutional:  Negative for chills and fever.  HENT:  Positive for trouble swallowing and voice change. Negative for postnasal drip and sinus pressure.   Respiratory:  Negative for cough, shortness of breath and wheezing.   Cardiovascular:  Positive for leg swelling. Negative for chest pain and palpitations.  Gastrointestinal:  Negative for nausea and vomiting.  Musculoskeletal:  Positive for back pain.      Objective:     BP 128/76   Pulse 78   Temp 98.7 F (37.1 C) (Oral)   Ht 5' 8 (1.727 m)   Wt 217 lb (98.4 kg)   SpO2 97%   BMI 32.99 kg/m   BP Readings from Last 3 Encounters:  04/15/24 128/76  02/22/24 120/70  02/07/24 114/68   Wt Readings from Last 3 Encounters:  04/15/24 217 lb (98.4 kg)  02/22/24 223 lb 9.6 oz (101.4 kg)  02/07/24 220 lb (99.8 kg)    Physical Exam Vitals reviewed.  Constitutional:      Appearance: She is well-developed.  HENT:     Head: Normocephalic and atraumatic.     Right Ear: Hearing, tympanic membrane, ear canal and external ear normal. No decreased hearing noted. No drainage, swelling or tenderness. No middle ear  effusion. No foreign body. Tympanic membrane is not erythematous or bulging.     Left Ear: Hearing, tympanic membrane, ear canal and external ear normal. No decreased hearing noted. No drainage, swelling or tenderness.  No middle ear effusion. No foreign body. Tympanic membrane is not erythematous or bulging.     Nose: Nose normal. No rhinorrhea.     Right Sinus: No maxillary sinus tenderness or frontal sinus tenderness.     Left Sinus: No maxillary sinus tenderness or frontal sinus tenderness.     Mouth/Throat:     Pharynx: Uvula midline. No oropharyngeal exudate or posterior oropharyngeal erythema.     Tonsils: No tonsillar abscesses.  Eyes:     Conjunctiva/sclera: Conjunctivae normal.  Cardiovascular:     Rate and Rhythm: Regular rhythm.     Pulses: Normal pulses.     Heart sounds: Normal heart sounds.     Comments: Trace BLE edema, non pitting Pulmonary:     Effort: Pulmonary effort is normal.     Breath sounds: Normal breath sounds. No wheezing, rhonchi or rales.  Lymphadenopathy:     Head:     Right side of head: No submental, submandibular, tonsillar, preauricular, posterior auricular or occipital adenopathy.     Left side of head: No submental, submandibular, tonsillar, preauricular, posterior auricular or occipital adenopathy.      Cervical: No cervical adenopathy.  Skin:    General: Skin is warm and dry.  Neurological:     Mental Status: She is alert.  Psychiatric:        Speech: Speech normal.        Behavior: Behavior normal.        Thought Content: Thought content normal.

## 2024-04-15 NOTE — Telephone Encounter (Signed)
 Seen today.

## 2024-04-17 ENCOUNTER — Telehealth: Payer: Self-pay | Admitting: Family

## 2024-04-17 DIAGNOSIS — H20021 Recurrent acute iridocyclitis, right eye: Secondary | ICD-10-CM | POA: Diagnosis not present

## 2024-04-17 NOTE — Assessment & Plan Note (Signed)
 Chronic, suboptimal control. Continue tramadol  50mg  BID, tylenol  650mg  BID.ESI scheduled 04/04/24.  Provided prescription of tizanidine  to use at bedtime for back pain/ cramping; of note , trial suspension of statin as well.

## 2024-04-17 NOTE — Telephone Encounter (Signed)
 LVM to call back to office to relay message below per Rollene. Please relay and document when pt calls back      Reviewing her chart as it relates to cramping of her legs   This can be a side effect of lipitor 20mg    Please ask her to TRIAL stop lipitor   Monitor blood pressure she is also holding amlodipine  2.5mg  due to leg swelling   Sch f/u with me in 7-10 days

## 2024-04-17 NOTE — Telephone Encounter (Signed)
 Spoke to pt regarding stopping Lipitor along with Amlodopine, pt verbalized understanding she has appt on 8-6

## 2024-04-17 NOTE — Assessment & Plan Note (Addendum)
 Concern for PND. Start azelastine . Consider trial of pepcid. Close followup.

## 2024-04-17 NOTE — Telephone Encounter (Signed)
 Call pt Reviewing her chart as it relates to cramping of her legs  This can be a side effect of lipitor 20mg   Please ask her to TRIAL stop lipitor  Monitor blood pressure she is also holding amlodipine  2.5mg  due to leg swelling  Sch f/u with me in 7-10 days

## 2024-04-17 NOTE — Assessment & Plan Note (Signed)
 Lab Results  Component Value Date   LDLCALC 80 02/22/2024   Concern that leg cramping is causing myalgia; she is also scheduled for [facility] 04/04/24.  Advised trial cessation of lipitor 20mg  x 2 weeks. Close follow up

## 2024-04-17 NOTE — Assessment & Plan Note (Signed)
 Well controlled. Suspect BLE edema likely from amlodipine . Trial stop amlodipine  2.5mg  with close follow up to recheck blood pressure.  Continue losartan  25mg  , lopressor  25mg  BID

## 2024-04-22 DIAGNOSIS — H20021 Recurrent acute iridocyclitis, right eye: Secondary | ICD-10-CM | POA: Diagnosis not present

## 2024-04-22 DIAGNOSIS — E119 Type 2 diabetes mellitus without complications: Secondary | ICD-10-CM | POA: Diagnosis not present

## 2024-04-22 DIAGNOSIS — H2513 Age-related nuclear cataract, bilateral: Secondary | ICD-10-CM | POA: Diagnosis not present

## 2024-04-22 LAB — ALDOSTERONE + RENIN ACTIVITY W/ RATIO
ALDO / PRA Ratio: 0.3 ratio — ABNORMAL LOW (ref 0.9–28.9)
Aldosterone: 5 ng/dL
Renin Activity: 18.49 ng/mL/h — ABNORMAL HIGH (ref 0.25–5.82)

## 2024-04-24 ENCOUNTER — Ambulatory Visit (INDEPENDENT_AMBULATORY_CARE_PROVIDER_SITE_OTHER): Admitting: Family

## 2024-04-24 ENCOUNTER — Encounter: Payer: Self-pay | Admitting: Family

## 2024-04-24 ENCOUNTER — Ambulatory Visit

## 2024-04-24 ENCOUNTER — Ambulatory Visit (INDEPENDENT_AMBULATORY_CARE_PROVIDER_SITE_OTHER)

## 2024-04-24 ENCOUNTER — Ambulatory Visit: Payer: Self-pay | Admitting: Family

## 2024-04-24 VITALS — BP 130/82 | HR 77 | Ht 68.0 in | Wt 217.0 lb

## 2024-04-24 DIAGNOSIS — M7989 Other specified soft tissue disorders: Secondary | ICD-10-CM | POA: Diagnosis not present

## 2024-04-24 DIAGNOSIS — J440 Chronic obstructive pulmonary disease with acute lower respiratory infection: Secondary | ICD-10-CM

## 2024-04-24 DIAGNOSIS — E039 Hypothyroidism, unspecified: Secondary | ICD-10-CM

## 2024-04-24 DIAGNOSIS — J209 Acute bronchitis, unspecified: Secondary | ICD-10-CM

## 2024-04-24 DIAGNOSIS — E785 Hyperlipidemia, unspecified: Secondary | ICD-10-CM | POA: Diagnosis not present

## 2024-04-24 DIAGNOSIS — R059 Cough, unspecified: Secondary | ICD-10-CM | POA: Diagnosis not present

## 2024-04-24 DIAGNOSIS — Z1211 Encounter for screening for malignant neoplasm of colon: Secondary | ICD-10-CM

## 2024-04-24 MED ORDER — AMOXICILLIN-POT CLAVULANATE 875-125 MG PO TABS: 1.0000 | ORAL_TABLET | Freq: Two times a day (BID) | ORAL | 0 refills | Status: AC

## 2024-04-24 NOTE — Progress Notes (Unsigned)
 Assessment & Plan:  Acute bronchitis with COPD (HCC) Assessment & Plan: D/D includes bacterial URI, GERD, esophageal stricture and aspiration pneumonia. Pending chest x-ray. Refer to gastroenterology for an upper endoscopy to evaluate for esophageal stricture. Prescribe Augmentin  for a potential bacterial cause. Consider prednisone  if symptoms persist, while monitoring for leg swelling and hyperglycemia.  Orders: -     DG Chest 2 View; Future -     Amoxicillin -Pot Clavulanate; Take 1 tablet by mouth 2 (two) times daily for 5 days.  Dispense: 10 tablet; Refill: 0  Hypothyroidism, unspecified type -     US  THYROID ; Future  Screen for colon cancer -     Ambulatory referral to Gastroenterology  Leg swelling Assessment & Plan: Chronic, stable.  Recommend conservative management with compression stockings, a low sodium diet, and leg elevation at the end of the day. Deferred diuretics until echocardiogram results are available.   Hyperlipidemia, unspecified hyperlipidemia type Assessment & Plan: Previous use of Lipitor caused muscle cramps. Reintroduce Lipitor at 10 mg once a week and gradually increase frequency if tolerated. Monitor for muscle cramps and adjust medication as needed.      Return precautions given.   Risks, benefits, and alternatives of the medications and treatment plan prescribed today were discussed, and patient expressed understanding.   Education regarding symptom management and diagnosis given to patient on AVS either electronically or printed.  Return in about 6 weeks (around 06/05/2024).  Rollene Northern, FNP  Subjective:    Patient ID: Cynthia [name], female    DOB: [birth year], 78 y.o.   MRN: [id number]  CC: Cynthia [name] is a 78 y.o. female who presents today for an acute visit.    HPI: HPI  Discussed the use of AI scribe software for clinical note transcription with the patient, who gave verbal consent to proceed.  History of Present Illness    Cynthia [name] is a 78 year old female who presents with hoarseness, cough, and leg swelling.  She has been experiencing persistent hoarseness and a cough that have worsened since her last visit on July 28th. The hoarseness is described as 'scratchy' and is particularly bothersome when swallowing food, leading to severe coughing episodes that almost induce vomiting. No pain when swallowing and no sensation of choking on food or pills.   She experiences leg swelling that has not improved since her last visit.  Denies fever, wheezing, orthopnea, wheezing, shortness of breath, or chest pain. The cough is worse at night, affecting her sleep. She has tried warm tea and nasal spray for relief but has not used any over-the-counter cough syrups recently.  She stopped taking Lipitor due to muscle cramps, which have since resolved. She has not experienced any more leg cramps since discontinuing the medication.  No symptoms of acid reflux such as burning or belching and does not take any medication for reflux.   H/o thyroidectomy ; she isnt sure if she has entire gland removed or nodule.               Follow up to visit 04/15/24 Trial stop lipitor Stopped amlodipine  2.5mg  d/t leg swelling Started tizanidine   Echocardiogram is scheduled Former smoker  Allergies: Patient has no known allergies. Current Outpatient Medications on File Prior to Visit  Medication Sig Dispense Refill   albuterol  (VENTOLIN  HFA) 108 (90 Base) MCG/ACT inhaler TAKE 2 PUFFS BY MOUTH EVERY 6 HOURS AS NEEDED FOR WHEEZE OR SHORTNESS OF BREATH 8.5 each 11   aspirin  EC 81  MG tablet Take 81 mg by mouth daily. Swallow whole.     atorvastatin  (LIPITOR) 20 MG tablet TAKE 1 TABLET BY MOUTH EVERY DAY 90 tablet 1   azelastine  (ASTELIN ) 0.1 % nasal spray Place 1 spray into both nostrils 2 (two) times daily. Use in each nostril as directed 30 mL 4   blood glucose meter kit and supplies KIT Dispense based on patient and insurance  preference. Use up to four times daily as directed. 1 each 0   budeson-glycopyrrolate-formoterol  (BREZTRI  AEROSPHERE) 160-9-4.8 MCG/ACT AERO Inhale 2 puffs into the lungs in the morning and at bedtime. 3 each 3   Calcium  Carbonate-Vit D-Min (CALCIUM  600+D PLUS MINERALS) 600-400 MG-UNIT TABS Take 1 tablet by mouth daily.      cyanocobalamin  (VITAMIN B12) 1000 MCG tablet Take 1 tablet (1,000 mcg total) by mouth daily. 90 tablet 3   diclofenac  sodium (VOLTAREN ) 1 % GEL Apply 1 application  topically 4 (four) times daily as needed (knee pain.).     DULoxetine  (CYMBALTA ) 60 MG capsule TAKE 1 CAPSULE BY MOUTH EVERY DAY 90 capsule 3   Glycopyrrolate-Formoterol  (BEVESPI  AEROSPHERE) 9-4.8 MCG/ACT AERO Inhale 2 puffs into the lungs 2 (two) times daily. 3 each 11   Lancet Devices (ONE TOUCH DELICA LANCING DEV) MISC 1 Device by Does not apply route daily. 1 each 2   Lancets (ONETOUCH DELICA PLUS LANCET30G) MISC USE AS INSTRUCTED 100 each 12   levothyroxine  (SYNTHROID ) 112 MCG tablet Take 1 tablet (112 mcg total) by mouth daily. 30 tablet 11   losartan  (COZAAR ) 50 MG tablet TAKE 1 TABLET BY MOUTH EVERY DAY 90 tablet 1   metFORMIN  (GLUCOPHAGE ) 1000 MG tablet Take 1 tablet (1,000 mg total) by mouth 2 (two) times daily with a meal. (Patient taking differently: Take 1,500 mg by mouth 2 (two) times daily with a meal. 1000 mg in the morning 500 mg in the evening) 180 tablet 3   metoprolol  tartrate (LOPRESSOR ) 25 MG tablet Take 1 tablet (25 mg total) by mouth 2 (two) times daily. 90 tablet 1   nabumetone  (RELAFEN ) 500 MG tablet TAKE 1 TABLET (500 MG TOTAL) BY MOUTH DAILY FOR 15 DAYS.     ONETOUCH ULTRA test strip TEST BLOOD SUGAR 1-2 TIMES A DAY 100 strip 5   potassium chloride  SA (KLOR-CON  M) 20 MEQ tablet Take 1 tablet (20 mEq total) by mouth daily. 30 tablet 3   tizanidine  (ZANAFLEX ) 2 MG capsule Take 1 capsule (2 mg total) by mouth at bedtime as needed for muscle spasms. 30 capsule 1   traMADol  (ULTRAM ) 50 MG  tablet Take 1 tablet (50 mg total) by mouth every 12 (twelve) hours as needed for severe pain (pain score 7-10). 60 tablet 2   No current facility-administered medications on file prior to visit.    Review of Systems  Constitutional:  Negative for chills and fever.  HENT:  Positive for congestion and trouble swallowing. Negative for sinus pressure, sinus pain and sore throat.   Respiratory:  Positive for cough and choking.   Cardiovascular:  Negative for chest pain and palpitations.  Gastrointestinal:  Negative for nausea and vomiting.      Objective:    There were no vitals taken for this visit.  BP Readings from Last 3 Encounters:  04/15/24 128/76  02/22/24 120/70  02/07/24 114/68   Wt Readings from Last 3 Encounters:  04/15/24 217 lb (98.4 kg)  02/22/24 223 lb 9.6 oz (101.4 kg)  02/07/24 220 lb (99.8 kg)  Physical Exam Vitals reviewed.  Constitutional:      Appearance: She is well-developed.  HENT:     Head: Normocephalic and atraumatic.     Right Ear: Hearing, tympanic membrane, ear canal and external ear normal. No decreased hearing noted. No drainage, swelling or tenderness. No middle ear effusion. No foreign body. Tympanic membrane is not erythematous or bulging.     Left Ear: Hearing, tympanic membrane, ear canal and external ear normal. No decreased hearing noted. No drainage, swelling or tenderness.  No middle ear effusion. No foreign body. Tympanic membrane is not erythematous or bulging.     Nose: Nose normal. No rhinorrhea.     Right Sinus: No maxillary sinus tenderness or frontal sinus tenderness.     Left Sinus: No maxillary sinus tenderness or frontal sinus tenderness.     Mouth/Throat:     Pharynx: Uvula midline. No oropharyngeal exudate or posterior oropharyngeal erythema.     Tonsils: No tonsillar abscesses.  Eyes:     Conjunctiva/sclera: Conjunctivae normal.  Neck:      Comments: Surgical scar Cardiovascular:     Rate and Rhythm: Normal rate and  regular rhythm.     Pulses: Normal pulses.     Heart sounds: Normal heart sounds.     Comments: Trace ble non pitting edema Compression stockings present Pulmonary:     Effort: Pulmonary effort is normal.     Breath sounds: Normal breath sounds. No wheezing, rhonchi or rales.  Lymphadenopathy:     Head:     Right side of head: No submental, submandibular, tonsillar, preauricular, posterior auricular or occipital adenopathy.     Left side of head: No submental, submandibular, tonsillar, preauricular, posterior auricular or occipital adenopathy.     Cervical: No cervical adenopathy.  Skin:    General: Skin is warm and dry.  Neurological:     Mental Status: She is alert.  Psychiatric:        Speech: Speech normal.        Behavior: Behavior normal.        Thought Content: Thought content normal.

## 2024-04-24 NOTE — Assessment & Plan Note (Signed)
 Chronic, stable.  Recommend conservative management with compression stockings, a low sodium diet, and leg elevation at the end of the day. Deferred diuretics until echocardiogram results are available.

## 2024-04-24 NOTE — Assessment & Plan Note (Signed)
 Previous use of Lipitor caused muscle cramps. Reintroduce Lipitor at 10 mg once a week and gradually increase frequency if tolerated. Monitor for muscle cramps and adjust medication as needed.

## 2024-04-24 NOTE — Progress Notes (Unsigned)
 Patient arrived for a BP check 136/82  pulse 77 then the Patient sat for about 5 minutes and I rechecked her BP 130/80. Patient did stop the amlodipine  2.5 mg per Rollene Northern and her legs are still swollen. Patient confirmed she is taking losartan  25 mg daily and lopressor  25 mg BID. Patient does not present with any symptoms of chest pain, headache or any other symptoms except bilateral leg swelling, continuing sore throat and cough.

## 2024-04-24 NOTE — Assessment & Plan Note (Signed)
 D/D includes bacterial URI, GERD, esophageal stricture and aspiration pneumonia. Pending chest x-ray. Refer to gastroenterology for an upper endoscopy to evaluate for esophageal stricture. Prescribe Augmentin  for a potential bacterial cause. Consider prednisone  if symptoms persist, while monitoring for leg swelling and hyperglycemia.

## 2024-04-24 NOTE — Patient Instructions (Addendum)
 Reintroduce Lipitor at 10 mg once a week and gradually increase frequency if tolerated. Monitor for muscle cramps and adjust medication as needed.  Order a chest x-ray to rule out pneumonia or aspiration pneumonia.   Refer to gastroenterology for an upper endoscopy to evaluate for esophageal stricture ( due to your choking).  Let us  know if you dont hear back within 2 weeks in regards to an appointment being scheduled.   So that you are aware, if you are [facility] MyChart user , please pay attention to your MyChart messages as you may receive a MyChart message with a phone number to call and schedule this test/appointment own your own from our referral coordinator. This is a new process so I do not want you to miss this message.  If you are not a MyChart user, you will receive a phone call.     Prescribe Augmentin  for a potential bacterial cause.   Ensure to take probiotics while on antibiotics and also for 2 weeks after completion. This can either be by eating yogurt daily or taking a probiotic supplement over the counter such as Culturelle.It is important to re-colonize the gut with good bacteria and also to prevent any diarrheal infections associated with antibiotic use.    Consider prednisone  if symptoms persist, while monitoring for leg swelling and hyperglycemia.  Let me know how you are doing.

## 2024-04-24 NOTE — Telephone Encounter (Signed)
 Copied from CRM #[id number]. Topic: General - Other >> Apr 24, 2024  1:46 PM Aleatha C wrote: Reason for CRM: Patient would like a callback about ultrasound appointment

## 2024-04-30 ENCOUNTER — Ambulatory Visit
Admission: RE | Admit: 2024-04-30 | Discharge: 2024-04-30 | Disposition: A | Discharge status: Home / Self Care | Source: Ambulatory Visit | Attending: Family | Admitting: Family

## 2024-04-30 DIAGNOSIS — E010 Iodine-deficiency related diffuse (endemic) goiter: Secondary | ICD-10-CM | POA: Diagnosis not present

## 2024-04-30 DIAGNOSIS — E039 Hypothyroidism, unspecified: Secondary | ICD-10-CM | POA: Insufficient documentation

## 2024-04-30 DIAGNOSIS — R221 Localized swelling, mass and lump, neck: Secondary | ICD-10-CM | POA: Diagnosis not present

## 2024-04-30 DIAGNOSIS — I083 Combined rheumatic disorders of mitral, aortic and tricuspid valves: Secondary | ICD-10-CM | POA: Insufficient documentation

## 2024-04-30 DIAGNOSIS — R008 Other abnormalities of heart beat: Secondary | ICD-10-CM | POA: Diagnosis not present

## 2024-04-30 DIAGNOSIS — E119 Type 2 diabetes mellitus without complications: Secondary | ICD-10-CM | POA: Insufficient documentation

## 2024-04-30 DIAGNOSIS — F17200 Nicotine dependence, unspecified, uncomplicated: Secondary | ICD-10-CM | POA: Insufficient documentation

## 2024-04-30 DIAGNOSIS — E785 Hyperlipidemia, unspecified: Secondary | ICD-10-CM | POA: Diagnosis not present

## 2024-04-30 DIAGNOSIS — M7989 Other specified soft tissue disorders: Secondary | ICD-10-CM | POA: Insufficient documentation

## 2024-04-30 DIAGNOSIS — I10 Essential (primary) hypertension: Secondary | ICD-10-CM | POA: Insufficient documentation

## 2024-04-30 LAB — ECHOCARDIOGRAM COMPLETE
AR max vel: 1.98 cm2
AV Peak grad: 6.3 mmHg
Ao pk vel: 1.26 m/s
Area-P 1/2: 2.73 cm2
P 1/2 time: 579 ms
S' Lateral: 3 cm

## 2024-04-30 NOTE — Progress Notes (Signed)
 Echocardiogram 2D Echocardiogram has been performed.  Cynthia [name] 04/30/2024, 12:17 PM

## 2024-05-03 ENCOUNTER — Telehealth: Payer: Self-pay

## 2024-05-03 ENCOUNTER — Ambulatory Visit: Payer: Self-pay | Admitting: Family

## 2024-05-03 NOTE — Progress Notes (Signed)
 Call PT left VM to call office back please relay results and document when complete thanks

## 2024-05-03 NOTE — Telephone Encounter (Signed)
 Spoke to pt to inform her that her results are not in as of yet but if not in by 05/06/24 I will call to have them pushed through and let her know as soon as Rollene reviews them

## 2024-05-03 NOTE — Telephone Encounter (Signed)
 Copied from CRM #[id number]. Topic: Clinical - Lab/Test Results >> May 03, 2024  9:43 AM Rosina BIRCH wrote: Reason for CRM: patient called wanting to know if her results came back from her echocardiogram  [phone]

## 2024-05-06 ENCOUNTER — Ambulatory Visit (INDEPENDENT_AMBULATORY_CARE_PROVIDER_SITE_OTHER): Payer: Medicare Other | Admitting: *Deleted

## 2024-05-06 VITALS — Ht 68.0 in | Wt 217.0 lb

## 2024-05-06 DIAGNOSIS — Z0000 Encounter for general adult medical examination without abnormal findings: Secondary | ICD-10-CM | POA: Diagnosis not present

## 2024-05-06 DIAGNOSIS — Z1231 Encounter for screening mammogram for malignant neoplasm of breast: Secondary | ICD-10-CM | POA: Diagnosis not present

## 2024-05-06 NOTE — Progress Notes (Signed)
 Subjective:   Cynthia [name] is a 78 y.o. who presents for a Medicare Wellness preventive visit.  As a reminder, Annual Wellness Visits don't include a physical exam, and some assessments may be limited, especially if this visit is performed virtually. We may recommend an in-person follow-up visit with your provider if needed.  Visit Complete: Virtual I connected with  Cynthia [name] on 05/06/24 by a audio enabled telemedicine application and verified that I am speaking with the correct person using two identifiers.  Patient Location: Home  Provider Location: Home Office  I discussed the limitations of evaluation and management by telemedicine. The patient expressed understanding and agreed to proceed.  Vital Signs: Because this visit was a virtual/telehealth visit, some criteria may be missing or patient reported. Any vitals not documented were not able to be obtained and vitals that have been documented are patient reported.  VideoDeclined- This patient declined Librarian, academic. Therefore the visit was completed with audio only.  Persons Participating in Visit: Patient.  AWV Questionnaire: No: Patient Medicare AWV questionnaire was not completed prior to this visit.  Cardiac Risk Factors include: advanced age (>33men, >4 women);diabetes mellitus;dyslipidemia;hypertension;obesity (BMI >30kg/m2)     Objective:    Today's Vitals   05/06/24 0850 05/06/24 0851  Weight: 217 lb (98.4 kg)   Height: 5' 8 (1.727 m)   PainSc:  5    Body mass index is 32.99 kg/m.     05/06/2024    9:07 AM 10/18/2023    9:49 AM 05/03/2023   10:31 AM 12/02/2022    9:51 AM 05/03/2022    9:57 AM 04/21/2022    9:29 AM 04/27/2021   10:15 AM  Advanced Directives  Does Patient Have a Medical Advance Directive? No No No No No No No  Type of Theme [name] manager;Living will     Would patient like information on creating a medical advance  directive? No - Patient declined No - Patient declined No - Patient declined No - Patient declined  No - Patient declined     Current Medications (verified) Outpatient Encounter Medications as of 05/06/2024  Medication Sig   albuterol  (VENTOLIN  HFA) 108 (90 Base) MCG/ACT inhaler TAKE 2 PUFFS BY MOUTH EVERY 6 HOURS AS NEEDED FOR WHEEZE OR SHORTNESS OF BREATH   aspirin  EC 81 MG tablet Take 81 mg by mouth daily. Swallow whole.   atorvastatin  (LIPITOR) 20 MG tablet TAKE 1 TABLET BY MOUTH EVERY DAY   azelastine  (ASTELIN ) 0.1 % nasal spray Place 1 spray into both nostrils 2 (two) times daily. Use in each nostril as directed   blood glucose meter kit and supplies KIT Dispense based on patient and insurance preference. Use up to four times daily as directed.   budeson-glycopyrrolate-formoterol  (BREZTRI  AEROSPHERE) 160-9-4.8 MCG/ACT AERO Inhale 2 puffs into the lungs in the morning and at bedtime.   Calcium  Carbonate-Vit D-Min (CALCIUM  600+D PLUS MINERALS) 600-400 MG-UNIT TABS Take 1 tablet by mouth daily.    cyanocobalamin  (VITAMIN B12) 1000 MCG tablet Take 1 tablet (1,000 mcg total) by mouth daily.   DULoxetine  (CYMBALTA ) 60 MG capsule TAKE 1 CAPSULE BY MOUTH EVERY DAY   Glycopyrrolate-Formoterol  (BEVESPI  AEROSPHERE) 9-4.8 MCG/ACT AERO Inhale 2 puffs into the lungs 2 (two) times daily.   Lancet Devices (ONE TOUCH DELICA LANCING DEV) MISC 1 Device by Does not apply route daily.   Lancets (ONETOUCH DELICA PLUS LANCET30G) MISC USE AS INSTRUCTED   levothyroxine  (SYNTHROID ) 112  MCG tablet Take 1 tablet (112 mcg total) by mouth daily.   losartan  (COZAAR ) 50 MG tablet TAKE 1 TABLET BY MOUTH EVERY DAY   metFORMIN  (GLUCOPHAGE ) 1000 MG tablet Take 1 tablet (1,000 mg total) by mouth 2 (two) times daily with a meal.   metoprolol  tartrate (LOPRESSOR ) 25 MG tablet Take 1 tablet (25 mg total) by mouth 2 (two) times daily.   nabumetone  (RELAFEN ) 500 MG tablet TAKE 1 TABLET (500 MG TOTAL) BY MOUTH DAILY FOR 15 DAYS.    ONETOUCH ULTRA test strip TEST BLOOD SUGAR 1-2 TIMES A DAY   potassium chloride  SA (KLOR-CON  M) 20 MEQ tablet Take 1 tablet (20 mEq total) by mouth daily.   tizanidine  (ZANAFLEX ) 2 MG capsule Take 1 capsule (2 mg total) by mouth at bedtime as needed for muscle spasms.   traMADol  (ULTRAM ) 50 MG tablet Take 1 tablet (50 mg total) by mouth every 12 (twelve) hours as needed for severe pain (pain score 7-10).   diclofenac  sodium (VOLTAREN ) 1 % GEL Apply 1 application  topically 4 (four) times daily as needed (knee pain.). (Patient not taking: Reported on 05/06/2024)   No facility-administered encounter medications on file as of 05/06/2024.    Allergies (verified) Patient has no known allergies.   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 denies   Diabetes mellitus without complication (HCC)    type 2   Family history of breast cancer    Family history of lymphoma    Family history of prostate cancer    GERD (gastroesophageal reflux disease)    Hyperlipidemia    Hypertension    Hypothyroidism    Lung nodule    Thyroid  disease    Past Surgical History:  Procedure Laterality [name]   BREAST BIOPSY Left 11/25/2015   stereo  neg   BREAST EXCISIONAL BIOPSY Left yrs ago   benign   COLONOSCOPY WITH PROPOFOL  N/A 04/20/2017   Procedure: COLONOSCOPY WITH PROPOFOL ;  Surgeon: Therisa Bi, MD;  Location: [facility] ENDOSCOPY;  Service: Endoscopy;  Laterality: N/A;   COLONOSCOPY WITH PROPOFOL  N/A 01/08/2018   Procedure: COLONOSCOPY WITH PROPOFOL ;  Surgeon: Therisa Bi, MD;  Location: [facility] ENDOSCOPY;  Service: Gastroenterology;  Laterality: N/A;   COLONOSCOPY WITH PROPOFOL  N/A 08/10/2018   Procedure: COLONOSCOPY WITH PROPOFOL ;  Surgeon: Therisa Bi, MD;  Location: [facility] ENDOSCOPY;  Service: Gastroenterology;  Laterality: N/A;   COLONOSCOPY  WITH PROPOFOL  N/A 02/08/2021   Procedure: COLONOSCOPY WITH PROPOFOL ;  Surgeon: Therisa Bi, MD;  Location: [facility] ENDOSCOPY;  Service: Gastroenterology;  Laterality: N/A;   IR RADIOLOGIST EVAL & MGMT  04/08/2020   IR RADIOLOGIST EVAL & MGMT  05/26/2020   IR RADIOLOGIST EVAL & MGMT  09/29/2020   IR RADIOLOGIST EVAL & MGMT  03/10/2021   IR RADIOLOGIST EVAL & MGMT  12/10/2021   RADIOLOGY WITH ANESTHESIA N/A 04/29/2020   Procedure: CRYOABLATION;  Surgeon: Johann Sieving, MD;  Location: [facility] ORS;  Service: Radiology;  Laterality: N/A;   THYROIDECTOMY     Dr. Lucien,  per patient 23s; d/t to thryoid nodule   VAGINAL DELIVERY     4   Family History  Problem Relation Age of Onset   Hypertension Mother    Hypertension Father    Prostate cancer Father 46   Diabetes Sister  Thyroid  disease Sister    Breast cancer Sister 70   Breast cancer Sister 31   Lymphoma Sister 76   Breast cancer Maternal Aunt    Breast cancer Maternal Aunt    Cancer Paternal Grandmother        unk type   Social History   Socioeconomic History   Marital status: Widowed    Spouse name: Not on file   Number of children: 4   Years of education: Not on file   Highest education level: Not on file  Occupational History   Not on file  Tobacco Use   Smoking status: Former    Current packs/day: 0.00    Average packs/day: 0.8 packs/day for 48.0 years (36.0 ttl pk-yrs)    Types: Cigarettes    Start [name]: 68    Quit [name]: 2015    Years since quitting: 10.6   Smokeless tobacco: Former    Types: Snuff, Chew  Vaping Use   Vaping status: Never Used  Substance and Sexual Activity   Alcohol use: No   Drug use: No   Sexual activity: Not Currently  Other Topics Concern   Not on file  Social History Narrative   Widow Has 4 children.      4 grandchildren.      Work - Retired from [employer]- walking the track, 4x per week      Diet- regular      Social Drivers of Research scientist (physical sciences) Strain: Low Risk  (05/06/2024)   Overall Financial Resource Strain (CARDIA)    Difficulty of Paying Living Expenses: Not hard at all  Food Insecurity: No Food Insecurity (05/06/2024)   Hunger Vital Sign    Worried About Running Out of Food in the Last Year: Never true    Ran Out of Food in the Last Year: Never true  Transportation Needs: No Transportation Needs (05/06/2024)   PRAPARE - Administrator, Civil Service (Medical): No    Lack of Transportation (Non-Medical): No  Physical Activity: Insufficiently Active (05/06/2024)   Exercise Vital Sign    Days of Exercise per Week: 3 days    Minutes of Exercise per Session: 20 min  Stress: No Stress Concern Present (05/06/2024)   [employer] of Occupational Health - Occupational Stress Questionnaire    Feeling of Stress: Not at all  Social Connections: Moderately Isolated (05/06/2024)   Social Connection and Isolation Panel    Frequency of Communication with Friends and Family: More than three times a week    Frequency of Social Gatherings with Friends and Family: More than three times a week    Attends Religious Services: More than 4 times per year    Active Member of [employer] or Organizations: No    Attends Banker Meetings: Never    Marital Status: Widowed    Tobacco Counseling Counseling given: Not Answered    Clinical Intake:  Pre-visit preparation completed: Yes  Pain : 0-10 Pain Score: 5  Pain Type: Chronic pain Pain Location: Leg Pain Orientation: Right, Left Pain Descriptors / Indicators: Nagging Pain Onset: More than a month ago Pain Frequency: Constant     BMI - recorded: 32.99 Nutritional Status: BMI > 30  Obese Nutritional Risks: None Diabetes: Yes CBG done?: No Did pt. bring in CBG monitor from home?: No  Lab Results  Component Value [name]   HGBA1C 6.8 (H) 02/22/2024   HGBA1C 7.0 (H) 09/28/2023  HGBA1C 6.5 (A) 06/28/2023     How often do you need to have someone help  you when you read instructions, pamphlets, or other written materials from your doctor or pharmacy?: 1 - Never  Interpreter Needed?: No  Information entered by :: R. Noemie Devivo LPN   Activities of Daily Living     05/06/2024    8:54 AM  In your present state of health, do you have any difficulty performing the following activities:  Hearing? 0  Vision? 0  Difficulty concentrating or making decisions? 0  Walking or climbing stairs? 1  Dressing or bathing? 0  Doing errands, shopping? 0  Preparing Food and eating ? N  Using the Toilet? N  In the past six months, have you accidently leaked urine? N  Do you have problems with loss of bowel control? N  Managing your Medications? N  Managing your Finances? N  Housekeeping or managing your Housekeeping? N    Patient Care Team: Dineen Rollene MATSU, FNP as PCP - General (Family Medicine) Maurie Rayfield BIRCH, RN as Registered Nurse Babara Call, MD as Consulting Physician (Hematology and Oncology) Isadora Hose, MD as Consulting Physician (Pulmonary Disease)  I have updated your Care Teams any recent Medical Services you may have received from other providers in the past year.     Assessment:   This is a routine wellness examination for Cynthia [name].  Hearing/Vision screen Hearing Screening - Comments:: No issues Vision Screening - Comments:: glasses   Goals Addressed             This Visit's Progress    Patient Stated       Wants to exercise more        Depression Screen     05/06/2024    9:01 AM 04/15/2024   12:31 PM 02/22/2024    9:07 AM 02/07/2024    9:41 AM 02/02/2024    9:32 AM 10/25/2023   11:40 AM 09/28/2023    9:31 AM  PHQ 2/9 Scores  PHQ - 2 Score 0 0 0 0 0 0 0  PHQ- 9 Score 0 0 0 0 0      Fall Risk     05/06/2024    8:56 AM 04/15/2024   12:31 PM 02/22/2024    9:07 AM 02/07/2024    9:41 AM 02/02/2024    9:32 AM  Fall Risk   Falls in the past year? 1 0 0 0 0  Number falls in past yr: 0 0 0 0 0  Injury with Fall? 0 0 0 0 0   Risk for fall due to : History of fall(s);Impaired mobility No Fall Risks No Fall Risks No Fall Risks No Fall Risks  Follow up Falls evaluation completed;Falls prevention discussed Falls evaluation completed Falls evaluation completed Falls evaluation completed;Education provided Falls evaluation completed;Education provided    MEDICARE RISK AT HOME:  Medicare Risk at Home Any stairs in or around the home?: Yes If so, are there any without handrails?: No Home free of loose throw rugs in walkways, pet beds, electrical cords, etc?: Yes Adequate lighting in your home to reduce risk of falls?: Yes Life alert?: No Use of a cane, Mcfall or w/c?: Yes Grab bars in the bathroom?: Yes Shower chair or bench in shower?: Yes Elevated toilet seat or a handicapped toilet?: No  TIMED UP AND GO:  Was the test performed?  No  Cognitive Function: 6CIT completed    04/07/2017    9:24 AM 04/07/2016  9:57 AM 04/02/2015    9:33 AM  MMSE - Mini Mental State Exam  Orientation to time 5  5  5    Orientation to Place 5  5  5    Registration 3  3  3    Attention/ Calculation 2  5  5    Attention/Calculation-comments Difficulty performing simple calculation     Recall 3  3  3    Language- name 2 objects 2  2  2    Language- repeat 1 1 1   Language- follow 3 step command 3  3  3    Language- read & follow direction 1  1  1    Write a sentence 1  1  1    Copy design 1  1  1    Total score 27  30  30       Data saved with a previous flowsheet row definition        05/06/2024    9:07 AM 05/03/2023   10:32 AM 04/21/2022    9:29 AM 04/20/2021   11:50 AM 04/17/2020   10:04 AM  6CIT Screen  What Year? 0 points 0 points 0 points 0 points 0 points  What month? 0 points 0 points 0 points 0 points 0 points  What time? 0 points 0 points 0 points 0 points 0 points  Count back from 20 4 points 2 points 0 points 0 points   Months in reverse 4 points 4 points  2 points   Repeat phrase 4 points 6 points 0 points 0 points    Total Score 12 points 12 points  2 points     Immunizations Immunization History  Administered [name](s) Administered   Fluad Quad(high Dose 65+) 05/24/2019, 06/16/2020, 10/27/2021, 07/18/2022   Fluad Trivalent(High Dose 65+) 06/28/2023   Influenza, High Dose Seasonal PF 07/18/2017, 07/11/2018   Influenza,inj,Quad PF,6+ Mos 06/13/2013, 06/13/2014, 07/03/2015, 05/11/2016   Influenza-Unspecified 07/18/2012   PFIZER Comirnaty(Gray Top)Covid-19 Tri-Sucrose Vaccine 10/23/2020   PFIZER(Purple Top)SARS-COV-2 Vaccination 12/20/2019, 01/14/2020   Pneumococcal Conjugate-13 03/07/2014   Pneumococcal Polysaccharide-23 03/08/2013   Tdap 05/17/2016    Screening Tests Health Maintenance  Topic [name] Due   Zoster Vaccines- Shingrix (1 of 2) Never done   COVID-19 Vaccine (4 - 2024-25 season) 05/21/2023   Colonoscopy  02/09/2024   MAMMOGRAM  05/01/2024   Medicare Annual Wellness (AWV)  05/02/2024   INFLUENZA VACCINE  04/19/2024   FOOT EXAM  06/01/2024   OPHTHALMOLOGY EXAM  06/11/2024   HEMOGLOBIN A1C  08/23/2024   Lung Cancer Screening  09/28/2024   Diabetic kidney evaluation - Urine ACR  01/24/2025   Diabetic kidney evaluation - eGFR measurement  04/15/2025   DTaP/Tdap/Td (2 - Td or Tdap) 05/17/2026   Pneumococcal Vaccine: 50+ Years  Completed   DEXA SCAN  Completed   Hepatitis C Screening  Completed   HPV VACCINES  Aged Out   Meningococcal B Vaccine  Aged Out   Pneumococcal Vaccine  Discontinued    Health Maintenance  Health Maintenance Due  Topic [name] Due   Zoster Vaccines- Shingrix (1 of 2) Never done   COVID-19 Vaccine (4 - 2024-25 season) 05/21/2023   Colonoscopy  02/09/2024   MAMMOGRAM  05/01/2024   Medicare Annual Wellness (AWV)  05/02/2024   INFLUENZA VACCINE  04/19/2024   Health Maintenance Items Addressed: Mammogram ordered, Discussed the need to update shingles and flu vaccines. Patient declines covid vaccine. Patient stated that she is aware that she needs a  colonoscopy but is holding off until she gets  her leg pain taken care of.  Additional Screening:  Vision Screening: Recommended annual ophthalmology exams for early detection of glaucoma and other disorders of the eye. Up to [name] Would you like a referral to an eye doctor? No    Dental Screening: Recommended annual dental exams for proper oral hygiene  Community Resource Referral / Chronic Care Management: CRR required this visit?  No   CCM required this visit?  No   Plan:    I have personally reviewed and noted the following in the patient's chart:   Medical and social history Use of alcohol, tobacco or illicit drugs  Current medications and supplements including opioid prescriptions. Patient is currently taking opioid prescriptions. Information provided to patient regarding non-opioid alternatives. Patient advised to discuss non-opioid treatment plan with their provider. Functional ability and status Nutritional status Physical activity Advanced directives List of other physicians Hospitalizations, surgeries, and ER visits in previous 12 months Vitals Screenings to include cognitive, depression, and falls Referrals and appointments  In addition, I have reviewed and discussed with patient certain preventive protocols, quality metrics, and best practice recommendations. A written personalized care plan for preventive services as well as general preventive health recommendations were provided to patient.   Angeline Fredericks, LPN   1/81/7974   After Visit Summary: (Pick Up) Due to this being a telephonic visit, with patients personalized plan was offered to patient and patient has requested to Pick up at office.  Notes: Nothing significant to report at this time.

## 2024-05-06 NOTE — Patient Instructions (Signed)
 Cynthia [name] , Thank you for taking time out of your busy schedule to complete your Annual Wellness Visit with me. I enjoyed our conversation and look forward to speaking with you again next year. I, as well as your care team,  appreciate your ongoing commitment to your health goals. Please review the following plan we discussed and let me know if I can assist you in the future. Your Game plan/ To Do List    Referrals: If you haven't heard from the office you've been referred to, please reach out to them at the phone provided.  An order has been placed for your mammogram. Remember to get your shingles and flu vaccines updated as discussed.  You have an order for:  []   2D Mammogram  [x]   3D Mammogram  []   Bone Density     Please call for appointment:  [facility]  [address] [place] [address] [phone]   Make sure to wear two-piece clothing.  No lotions, powders, or deodorants the day of the appointment. Make sure to bring picture ID and insurance card.  Bring list of medications you are currently taking including any supplements.    Follow up Visits: We will see or speak with you next year for your Next Medicare AWV with our clinical staff 05/07/25 @ 8:50 Have you seen your provider in the last 6 months (3 months if uncontrolled diabetes)? Yes  Clinician Recommendations:  Aim for 30 minutes of exercise or brisk walking, 6-8 glasses of water, and 5 servings of fruits and vegetables each day.       This is a list of the screenings recommended for you:  Health Maintenance  Topic Date Due   Zoster (Shingles) Vaccine (1 of 2) Never done   COVID-19 Vaccine (4 - 2024-25 season) 05/21/2023   Colon Cancer Screening  02/09/2024   Mammogram  05/01/2024   Flu Shot  04/19/2024   Complete foot exam   06/01/2024   Eye exam for diabetics  06/11/2024   Hemoglobin A1C  08/23/2024   Screening for Lung Cancer  09/28/2024   Yearly  kidney health urinalysis for diabetes  01/24/2025   Yearly kidney function blood test for diabetes  04/15/2025   Medicare Annual Wellness Visit  05/06/2025   DTaP/Tdap/Td vaccine (2 - Td or Tdap) 05/17/2026   Pneumococcal Vaccine for age over 28  Completed   DEXA scan (bone density measurement)  Completed   Hepatitis C Screening  Completed   HPV Vaccine  Aged Out   Meningitis B Vaccine  Aged Out   Pneumococcal Vaccine  Discontinued    Advanced directives: (Declined) Advance directive discussed with you today. Even though you declined this today, please call our office should you change your mind, and we can give you the proper paperwork for you to fill out. Advance Care Planning is important because it:  [x]  Makes sure you receive the medical care that is consistent with your values, goals, and preferences  [x]  It provides guidance to your family and loved ones and reduces their decisional burden about whether or not they are making the right decisions based on your wishes.  Managing Pain Without Opioids Opioids are strong medicines used to treat moderate to severe pain. For some people, especially those who have long-term (chronic) pain, opioids may not be the best choice for pain management due to: Side effects like nausea, constipation, and sleepiness. The risk of addiction (opioid use disorder).  The longer you take opioids, the greater your risk of addiction. Pain that lasts for more than 3 months is called chronic pain. Managing chronic pain usually requires more than one approach and is often provided by a team of health care providers working together (multidisciplinary approach). Pain management may be done at a pain management center or pain clinic. How to manage pain without the use of opioids Use non-opioid medicines Non-opioid medicines for pain may include: Over-the-counter or prescription non-steroidal anti-inflammatory drugs (NSAIDs). These may be the first medicines used  for pain. They work well for muscle and bone pain, and they reduce swelling. Acetaminophen . This over-the-counter medicine may work well for milder pain but not swelling. Antidepressants. These may be used to treat chronic pain. A certain type of antidepressant (tricyclics) is often used. These medicines are given in lower doses for pain than when used for depression. Anticonvulsants. These are usually used to treat seizures but may also reduce nerve (neuropathic) pain. Muscle relaxants. These relieve pain caused by sudden muscle tightening (spasms). You may also use a pain medicine that is applied to the skin as a patch, cream, or gel (topical analgesic), such as a numbing medicine. These may cause fewer side effects than medicines taken by mouth. Do certain therapies as directed Some therapies can help with pain management. They include: Physical therapy. You will do exercises to gain strength and flexibility. A physical therapist may teach you exercises to move and stretch parts of your body that are weak, stiff, or painful. You can learn these exercises at physical therapy visits and practice them at home. Physical therapy may also involve: Massage. Heat wraps or applying heat or cold to affected areas. Electrical signals that interrupt pain signals (transcutaneous electrical nerve stimulation, TENS). Weak lasers that reduce pain and swelling (low-level laser therapy). Signals from your body that help you learn to regulate pain (biofeedback). Occupational therapy. This helps you to learn ways to function at home and work with less pain. Recreational therapy. This involves trying new activities or hobbies, such as a physical activity or drawing. Mental health therapy, including: Cognitive behavioral therapy (CBT). This helps you learn coping skills for dealing with pain. Acceptance and commitment therapy (ACT) to change the way you think and react to pain. Relaxation therapies, including muscle  relaxation exercises and mindfulness-based stress reduction. Pain management counseling. This may be individual, family, or group counseling.  Receive medical treatments Medical treatments for pain management include: Nerve block injections. These may include a pain blocker and anti-inflammatory medicines. You may have injections: Near the spine to relieve chronic back or neck pain. Into joints to relieve back or joint pain. Into nerve areas that supply a painful area to relieve body pain. Into muscles (trigger point injections) to relieve some painful muscle conditions. A medical device placed near your spine to help block pain signals and relieve nerve pain or chronic back pain (spinal cord stimulation device). Acupuncture. Follow these instructions at home Medicines Take over-the-counter and prescription medicines only as told by your health care provider. If you are taking pain medicine, ask your health care providers about possible side effects to watch out for. Do not drive or use heavy machinery while taking prescription opioid pain medicine. Lifestyle  Do not use drugs or alcohol to reduce pain. If you drink alcohol, limit how much you have to: 0-1 drink a day for women who are not pregnant. 0-2 drinks a day for men. Know how much alcohol is in a drink.  In the [place] one drink equals one 12 oz bottle of beer (355 mL), one 5 oz glass of wine (148 mL), or one 1 oz glass of hard liquor (44 mL). Do not use any products that contain nicotine or tobacco. These products include cigarettes, chewing tobacco, and vaping devices, such as e-cigarettes. If you need help quitting, ask your health care provider. Eat a healthy diet and maintain a healthy weight. Poor diet and excess weight may make pain worse. Eat foods that are high in fiber. These include fresh fruits and vegetables, whole grains, and beans. Limit foods that are high in fat and processed sugars, such as fried and sweet  foods. Exercise regularly. Exercise lowers stress and may help relieve pain. Ask your health care provider what activities and exercises are safe for you. If your health care provider approves, join an exercise class that combines movement and stress reduction. Examples include yoga and tai chi. Get enough sleep. Lack of sleep may make pain worse. Lower stress as much as possible. Practice stress reduction techniques as told by your therapist. General instructions Work with all your pain management providers to find the treatments that work best for you. You are an important member of your pain management team. There are many things you can do to reduce pain on your own. Consider joining an online or in-person support group for people who have chronic pain. Keep all follow-up visits. This is important. Where to find more information You can find more information about managing pain without opioids from: American Academy of Pain Medicine: painmed.org Institute for Chronic Pain: instituteforchronicpain.org American Chronic Pain Association: theacpa.org Contact a health care provider if: You have side effects from pain medicine. Your pain gets worse or does not get better with treatments or home therapy. You are struggling with anxiety or depression. Summary Many types of pain can be managed without opioids. Chronic pain may respond better to pain management without opioids. Pain is best managed when you and a team of health care providers work together. Pain management without opioids may include non-opioid medicines, medical treatments, physical therapy, mental health therapy, and lifestyle changes. Tell your health care providers if your pain gets worse or is not being managed well enough. This information is not intended to replace advice given to you by your health care provider. Make sure you discuss any questions you have with your health care provider. Document Revised: 12/16/2020  Document Reviewed: 12/16/2020  Elsevier Patient Education  2024 [employer].

## 2024-05-09 ENCOUNTER — Ambulatory Visit: Payer: Self-pay

## 2024-05-09 NOTE — Telephone Encounter (Signed)
 FYI Only or Action Required?: FYI only for provider. Appt scheduled for Sept 18th  - first available. Please advise if this is soon enough.  Patient was last seen in primary care on 04/24/2024 by Dineen Rollene MATSU, FNP.  Called Nurse Triage reporting Hypertension.  Symptoms began ongoing.  Interventions attempted: Nothing. Will take HNT meds and call back if needed  Symptoms are: unchanged.  Triage Disposition: See PCP Within 2 Weeks  Patient/caregiver understands and will follow disposition?: Yes                    Copied from CRM #[id number]. Topic: Clinical - Medical Advice >> May 09, 2024  8:26 AM Kathrin PARAS wrote: Reason for CRM: Patient says her blood pressure is higher than normal 140/100 and she wants to know if she should take her bp pills Reason for Disposition  [1] Systolic BP >= 130 OR Diastolic >= 80 AND [2] taking BP medications  Answer Assessment - Initial Assessment Questions Pt has not yet taken her HTN medications today. She will take them after she has some breakfast. She will call back if BP is higher or if she develops HA, dizziness, chest pain, SOB.   1. BLOOD PRESSURE: What is your blood pressure? Did you take at least two measurements 5 minutes apart?     147/98  10-15 minutes ago. 2. ONSET: When did you take your blood pressure?     10-15 minutes a go 3. HOW: How did you take your blood pressure? (e.g., automatic home BP monitor, visiting nurse)     Home monitor 4. HISTORY: Do you have a history of high blood pressure?     yes 5. MEDICINES: Are you taking any medicines for blood pressure? Have you missed any doses recently?     yes 6. OTHER SYMPTOMS: Do you have any symptoms? (e.g., blurred vision, chest pain, difficulty breathing, headache, weakness)     no  Protocols used: Blood Pressure - High-A-AH

## 2024-05-09 NOTE — Telephone Encounter (Signed)
 Scheduled appt to see Vincente on 05/14/24

## 2024-05-09 NOTE — Telephone Encounter (Unsigned)
 Copied from CRM #[id number]. Topic: General - Other >> May 09, 2024 11:15 AM Cynthia [name] wrote: Reason for CRM: Patient called in stating that [name] said she would be calling her back in a hour and she has not called yet. Patient is requesting a call back and can be reached at [phone].

## 2024-05-10 NOTE — Telephone Encounter (Signed)
 Spoke to pot she stated that her BP when she took it was 137/92, but had no symptoms, she stated that it was high for her though, but in speaking to pt she informed me that the cuff she was using at home was a small one, I informed her that she needs a large cuff because that can cause a In accurate reading as well. Pt stated that she would go get a large cuff and keep a check on BP and see if her numbers change any and would let us  know

## 2024-05-14 ENCOUNTER — Encounter: Payer: Self-pay | Admitting: Nurse Practitioner

## 2024-05-14 ENCOUNTER — Ambulatory Visit (INDEPENDENT_AMBULATORY_CARE_PROVIDER_SITE_OTHER): Admitting: Nurse Practitioner

## 2024-05-14 VITALS — BP 136/82 | HR 87 | Temp 98.3°F | Ht 68.0 in | Wt 217.2 lb

## 2024-05-14 DIAGNOSIS — J029 Acute pharyngitis, unspecified: Secondary | ICD-10-CM | POA: Diagnosis not present

## 2024-05-14 DIAGNOSIS — I10 Essential (primary) hypertension: Secondary | ICD-10-CM

## 2024-05-14 LAB — POCT RAPID STREP A (OFFICE): Rapid Strep A Screen: NEGATIVE

## 2024-05-14 MED ORDER — PREDNISONE 20 MG PO TABS: 40.0000 mg | ORAL_TABLET | Freq: Every day | ORAL | 0 refills | Status: AC

## 2024-05-14 NOTE — Progress Notes (Signed)
 Established Patient Office Visit  Subjective:  Patient ID: Cynthia [name], female    DOB: 03-12-1946  Age: 78 y.o. MRN: [id number]  CC:  Chief Complaint  Patient presents with   Medical Management of Chronic Issues    HTN Sore throat & dry cough x 2 weeks Vomited last night due to hard dry coughing for so long   Discussed the use of a AI scribe software for clinical note transcription with the patient, who gave verbal consent to proceed.  HPI  Cynthia [name] presents  with a persistent cough and sore throat.  She has experienced a persistent dry cough for two weeks, worsening at night and when lying down, leading to episodes of vomiting. Cough drops have been used without relief. A course of Augmentin  and nasal spray have not improved symptoms.  She has a sore throat and neck pain, intensifying with head movement and radiating to the back of her head. Swallowing food causes discomfort. She has untreated acid reflux, with dinner around 6:00 to 6:30 PM and bedtime at 11:00 PM.  She manages hypertension with metoprolol  and losartan , with improved leg cramps after stopping amlodipine . She takes atorvastatin  weekly. Home blood pressure monitoring shows diastolic readings in the high 19d. She primarily drinks water. No fever, dizziness, or headache.  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 denies   Diabetes mellitus without complication (HCC)    type 2   Family history of breast cancer    Family history of lymphoma    Family history of prostate cancer    GERD (gastroesophageal reflux disease)    Hyperlipidemia    Hypertension    Hypothyroidism    Lung nodule    Thyroid  disease     Past Surgical History:  Procedure Laterality Date   BREAST BIOPSY Left 11/25/2015   stereo  neg   BREAST EXCISIONAL BIOPSY  Left yrs ago   benign   COLONOSCOPY WITH PROPOFOL  N/A 04/20/2017   Procedure: COLONOSCOPY WITH PROPOFOL ;  Surgeon: Therisa Bi, MD;  Location: [facility] ENDOSCOPY;  Service: Endoscopy;  Laterality: N/A;   COLONOSCOPY WITH PROPOFOL  N/A 01/08/2018   Procedure: COLONOSCOPY WITH PROPOFOL ;  Surgeon: Therisa Bi, MD;  Location: [facility] ENDOSCOPY;  Service: Gastroenterology;  Laterality: N/A;   COLONOSCOPY WITH PROPOFOL  N/A 08/10/2018   Procedure: COLONOSCOPY WITH PROPOFOL ;  Surgeon: Therisa Bi, MD;  Location: [facility] ENDOSCOPY;  Service: Gastroenterology;  Laterality: N/A;   COLONOSCOPY WITH PROPOFOL  N/A 02/08/2021   Procedure: COLONOSCOPY WITH PROPOFOL ;  Surgeon: Therisa Bi, MD;  Location: [facility] ENDOSCOPY;  Service: Gastroenterology;  Laterality: N/A;   IR RADIOLOGIST EVAL & MGMT  04/08/2020   IR RADIOLOGIST EVAL & MGMT  05/26/2020   IR RADIOLOGIST EVAL & MGMT  09/29/2020   IR RADIOLOGIST EVAL & MGMT  03/10/2021   IR RADIOLOGIST EVAL & MGMT  12/10/2021   RADIOLOGY WITH ANESTHESIA N/A 04/29/2020   Procedure: CRYOABLATION;  Surgeon: Johann Sieving, MD;  Location: [facility] ORS;  Service: Radiology;  Laterality: N/A;   THYROIDECTOMY     Dr. Lucien,  per patient 49s; d/t to thryoid nodule   VAGINAL DELIVERY     4    Family History  Problem Relation Age of Onset   Hypertension Mother    Hypertension Father  Prostate cancer Father 21   Diabetes Sister    Thyroid  disease Sister    Breast cancer Sister 32   Breast cancer Sister 69   Lymphoma Sister 79   Breast cancer Maternal Aunt    Breast cancer Maternal Aunt    Cancer Paternal Grandmother        unk type    Social History   Socioeconomic History   Marital status: Widowed    Spouse name: Not on file   Number of children: 4   Years of education: Not on file   Highest education level: Not on file  Occupational History   Not on file  Tobacco Use   Smoking status: Former    Current packs/day: 0.00    Average packs/day: 0.8 packs/day for  48.0 years (36.0 ttl pk-yrs)    Types: Cigarettes    Start date: 30    Quit date: 2015    Years since quitting: 10.6   Smokeless tobacco: Former    Types: Snuff, Chew  Vaping Use   Vaping status: Never Used  Substance and Sexual Activity   Alcohol use: No   Drug use: No   Sexual activity: Not Currently  Other Topics Concern   Not on file  Social History Narrative   Widow Has 4 children.      4 grandchildren.      Work - Retired from [employer]- walking the track, 4x per week      Diet- regular      Social Drivers of Corporate investment banker Strain: Low Risk  (05/06/2024)   Overall Financial Resource Strain (CARDIA)    Difficulty of Paying Living Expenses: Not hard at all  Food Insecurity: No Food Insecurity (05/06/2024)   Hunger Vital Sign    Worried About Running Out of Food in the Last Year: Never true    Ran Out of Food in the Last Year: Never true  Transportation Needs: No Transportation Needs (05/06/2024)   PRAPARE - Administrator, Civil Service (Medical): No    Lack of Transportation (Non-Medical): No  Physical Activity: Insufficiently Active (05/06/2024)   Exercise Vital Sign    Days of Exercise per Week: 3 days    Minutes of Exercise per Session: 20 min  Stress: No Stress Concern Present (05/06/2024)   [employer] of Occupational Health - Occupational Stress Questionnaire    Feeling of Stress: Not at all  Social Connections: Moderately Isolated (05/06/2024)   Social Connection and Isolation Panel    Frequency of Communication with Friends and Family: More than three times a week    Frequency of Social Gatherings with Friends and Family: More than three times a week    Attends Religious Services: More than 4 times per year    Active Member of [employer] or Organizations: No    Attends Banker Meetings: Never    Marital Status: Widowed  Intimate Partner Violence: Not At Risk (05/06/2024)   Humiliation, Afraid, Rape,  and Kick questionnaire    Fear of Current or Ex-Partner: No    Emotionally Abused: No    Physically Abused: No    Sexually Abused: No     Outpatient Medications Prior to Visit  Medication Sig Dispense Refill   albuterol  (VENTOLIN  HFA) 108 (90 Base) MCG/ACT inhaler TAKE 2 PUFFS BY MOUTH EVERY 6 HOURS AS NEEDED FOR WHEEZE OR SHORTNESS OF BREATH 8.5 each 11   aspirin  EC 81 MG  tablet Take 81 mg by mouth daily. Swallow whole.     atorvastatin  (LIPITOR) 20 MG tablet TAKE 1 TABLET BY MOUTH EVERY DAY 90 tablet 1   azelastine  (ASTELIN ) 0.1 % nasal spray Place 1 spray into both nostrils 2 (two) times daily. Use in each nostril as directed 30 mL 4   blood glucose meter kit and supplies KIT Dispense based on patient and insurance preference. Use up to four times daily as directed. 1 each 0   budeson-glycopyrrolate-formoterol  (BREZTRI  AEROSPHERE) 160-9-4.8 MCG/ACT AERO Inhale 2 puffs into the lungs in the morning and at bedtime. 3 each 3   Calcium  Carbonate-Vit D-Min (CALCIUM  600+D PLUS MINERALS) 600-400 MG-UNIT TABS Take 1 tablet by mouth daily.      cyanocobalamin  (VITAMIN B12) 1000 MCG tablet Take 1 tablet (1,000 mcg total) by mouth daily. 90 tablet 3   DULoxetine  (CYMBALTA ) 60 MG capsule TAKE 1 CAPSULE BY MOUTH EVERY DAY 90 capsule 3   Lancet Devices (ONE TOUCH DELICA LANCING DEV) MISC 1 Device by Does not apply route daily. 1 each 2   Lancets (ONETOUCH DELICA PLUS LANCET30G) MISC USE AS INSTRUCTED 100 each 12   levothyroxine  (SYNTHROID ) 112 MCG tablet Take 1 tablet (112 mcg total) by mouth daily. 30 tablet 11   losartan  (COZAAR ) 50 MG tablet TAKE 1 TABLET BY MOUTH EVERY DAY 90 tablet 1   metFORMIN  (GLUCOPHAGE ) 1000 MG tablet Take 1 tablet (1,000 mg total) by mouth 2 (two) times daily with a meal. 180 tablet 3   metoprolol  tartrate (LOPRESSOR ) 25 MG tablet Take 1 tablet (25 mg total) by mouth 2 (two) times daily. 90 tablet 1   ONETOUCH ULTRA test strip TEST BLOOD SUGAR 1-2 TIMES A DAY 100 strip 5    potassium chloride  SA (KLOR-CON  M) 20 MEQ tablet Take 1 tablet (20 mEq total) by mouth daily. 30 tablet 3   traMADol  (ULTRAM ) 50 MG tablet Take 1 tablet (50 mg total) by mouth every 12 (twelve) hours as needed for severe pain (pain score 7-10). 60 tablet 2   diclofenac  sodium (VOLTAREN ) 1 % GEL Apply 1 application  topically 4 (four) times daily as needed (knee pain.). (Patient not taking: Reported on 05/14/2024)     Glycopyrrolate-Formoterol  (BEVESPI  AEROSPHERE) 9-4.8 MCG/ACT AERO Inhale 2 puffs into the lungs 2 (two) times daily. (Patient not taking: Reported on 05/14/2024) 3 each 11   nabumetone  (RELAFEN ) 500 MG tablet TAKE 1 TABLET (500 MG TOTAL) BY MOUTH DAILY FOR 15 DAYS. (Patient not taking: Reported on 05/14/2024)     tizanidine  (ZANAFLEX ) 2 MG capsule Take 1 capsule (2 mg total) by mouth at bedtime as needed for muscle spasms. (Patient not taking: Reported on 05/14/2024) 30 capsule 1   No facility-administered medications prior to visit.    No Known Allergies  ROS Review of Systems Negative unless indicated in HPI.    Objective:    Physical Exam HENT:     Right Ear: Tympanic membrane normal. Tympanic membrane is not erythematous.     Left Ear: Tympanic membrane normal. Tympanic membrane is not erythematous.     Nose:     Right Turbinates: Not enlarged.     Left Turbinates: Not enlarged.     Right Sinus: No maxillary sinus tenderness or frontal sinus tenderness.     Left Sinus: No maxillary sinus tenderness or frontal sinus tenderness.     Mouth/Throat:     Mouth: Mucous membranes are moist.     Pharynx: Posterior oropharyngeal erythema present. No  pharyngeal swelling or oropharyngeal exudate.     Tonsils: No tonsillar exudate.  Cardiovascular:     Rate and Rhythm: Normal rate and regular rhythm.  Pulmonary:     Effort: Pulmonary effort is normal.     Breath sounds: Normal breath sounds. No stridor. No wheezing.  Neurological:     General: No focal deficit present.      Mental Status: She is oriented to person, place, and time. Mental status is at baseline.  Psychiatric:        Mood and Affect: Mood normal.        Behavior: Behavior normal.        Thought Content: Thought content normal.        Judgment: Judgment normal.     BP 136/82   Pulse 87   Temp 98.3 F (36.8 C)   Ht 5' 8 (1.727 m)   Wt 217 lb 3.2 oz (98.5 kg)   SpO2 98%   BMI 33.03 kg/m  Wt Readings from Last 3 Encounters:  05/14/24 217 lb 3.2 oz (98.5 kg)  05/06/24 217 lb (98.4 kg)  04/25/24 217 lb (98.4 kg)     Health Maintenance  Topic Date Due   Zoster Vaccines- Shingrix (1 of 2) Never done   Colonoscopy  02/09/2024   MAMMOGRAM  05/01/2024   COVID-19 Vaccine (4 - 2025-26 season) 05/20/2024   Influenza Vaccine  12/17/2024 (Originally 04/19/2024)   FOOT EXAM  06/01/2024   OPHTHALMOLOGY EXAM  06/11/2024   HEMOGLOBIN A1C  08/23/2024   Lung Cancer Screening  09/28/2024   Diabetic kidney evaluation - Urine ACR  01/24/2025   Diabetic kidney evaluation - eGFR measurement  04/15/2025   Medicare Annual Wellness (AWV)  05/06/2025   DTaP/Tdap/Td (2 - Td or Tdap) 05/17/2026   Pneumococcal Vaccine: 50+ Years  Completed   DEXA SCAN  Completed   Hepatitis C Screening  Completed   HPV VACCINES  Aged Out   Meningococcal B Vaccine  Aged Out    There are no preventive care reminders to display for this patient.  Lab Results  Component Value Date   TSH 3.96 12/06/2023   Lab Results  Component Value Date   WBC 4.9 12/27/2023   HGB 13.4 12/27/2023   HCT 40.6 12/27/2023   MCV 96.8 12/27/2023   PLT 320.0 12/27/2023   Lab Results  Component Value Date   NA 137 04/15/2024   K 4.4 04/15/2024   CO2 27 04/15/2024   GLUCOSE 85 04/15/2024   BUN 8 04/15/2024   CREATININE 0.58 04/15/2024   BILITOT 0.7 12/06/2023   ALKPHOS 72 12/06/2023   AST 22 12/06/2023   ALT 17 12/06/2023   PROT 7.2 12/06/2023   ALBUMIN  4.3 12/06/2023   CALCIUM  10.1 04/15/2024   ANIONGAP 10 12/01/2023   EGFR  83 12/31/2020   GFR 86.62 04/15/2024   Lab Results  Component Value Date   CHOL 169 02/22/2024   Lab Results  Component Value Date   HDL 75.60 02/22/2024   Lab Results  Component Value Date   LDLCALC 80 02/22/2024   Lab Results  Component Value Date   TRIG 69.0 02/22/2024   Lab Results  Component Value Date   CHOLHDL 2 02/22/2024   Lab Results  Component Value Date   HGBA1C 6.8 (H) 02/22/2024      Assessment & Plan:  Primary hypertension Assessment & Plan: Patient BP  Vitals:   05/14/24 1553  BP: 136/82  Current medications include metoprolol  25  mg BID  and losartan  25 mg . Amlodipine  discontinued due to leg cramps. High salt intake noted. - Encourage reduction of salt intake. - Promote healthy eating habits. - Advise on increasing fluid intake.    Sorethroat -     POCT rapid strep A  Pharyngitis, unspecified etiology Assessment & Plan: Negative strep.  - Given symptoms will start on prednisone . -Advised salt water gargles.  -She wil let us  know if symptoms not improving.   Other orders -     predniSONE ; Take 2 tablets (40 mg total) by mouth daily with breakfast for 5 days.  Dispense: 10 tablet; Refill: 0    Follow-up: No follow-ups on file.   Swayze Kozuch, NP

## 2024-05-23 ENCOUNTER — Other Ambulatory Visit: Payer: Self-pay | Admitting: Family

## 2024-05-23 ENCOUNTER — Ambulatory Visit: Payer: Self-pay

## 2024-05-23 DIAGNOSIS — I10 Essential (primary) hypertension: Secondary | ICD-10-CM

## 2024-05-23 NOTE — Telephone Encounter (Signed)
 LMTCB. Please relay message from Rollene, NP to pt when she calls back.

## 2024-05-23 NOTE — Telephone Encounter (Signed)
 FYI Only or Action Required?: Action required by provider: update on patient condition.  Patient was last seen in primary care on 05/14/2024 by Vincente Saber, NP.  Called Nurse Triage reporting Knee Pain.  Symptoms began pt couldn't say.  Interventions attempted: Rest, hydration, or home remedies.  Symptoms are: gradually worsening.  Triage Disposition: See Physician Within 24 Hours  Patient/caregiver understands and will follow disposition?: No, refuses dispositionCopied from CRM #[id number]. Topic: Clinical - Red Word Triage >> May 23, 2024  8:33 AM Carlyon D wrote: Red Word that prompted transfer to Nurse Triage: Right knee severely swollen and pain pt states she can barley walk. Reason for Disposition  [1] Very swollen joint AND [2] no fever  Answer Assessment - Initial Assessment Questions Swelling  has increased and hurts to walk. Pt has tried ice and elevation. Pt is asking for  medication to be called in  or OTC suggestion. Pt is refusing appt at this time. Please advise.     1. LOCATION and RADIATION: Where is the pain located?      Right knee 2. QUALITY: What does the pain feel like?  (e.g., sharp, dull, aching, burning)     Stiff 3. SEVERITY: How bad is the pain? What does it keep you from doing?   (Scale 1-10; or mild, moderate, severe)     5 4. ONSET: When did the pain start? Does it come and go, or is it there all the time?     Not sure 5. RECURRENT: Have you had this pain before? If Yes, ask: When, and what happened then?     yes 6. SETTING: Has there been any recent work, exercise or other activity that involved that part of the body?      na 7. AGGRAVATING FACTORS: What makes the knee pain worse? (e.g., walking, climbing stairs, running)     Walking 8. ASSOCIATED SYMPTOMS: Is there any swelling or redness of the knee?     yes 9. OTHER SYMPTOMS: Do you have any other symptoms? (e.g., calf pain, chest pain, difficulty breathing, fever)      denies  Protocols used: Knee Pain-A-AH

## 2024-05-24 DIAGNOSIS — M1711 Unilateral primary osteoarthritis, right knee: Secondary | ICD-10-CM | POA: Diagnosis not present

## 2024-05-24 MED ORDER — MAGNESIUM OXIDE 400 MG PO TABS
400.0000 mg | ORAL_TABLET | Freq: Two times a day (BID) | ORAL | 3 refills | Status: DC
Start: 2024-05-24 — End: 2024-07-25

## 2024-05-24 NOTE — Telephone Encounter (Signed)
 Patient returned call: Patient states the injection did help- it lasted 2 months. Patient would be willing to go back to Ortho- she was not aware she could contact them for follow up.  Patient is using tramadol  presently.  Patient reports her knee is swollen about double size. She states only other swelling in that leg is above the knee in the thigh- nothing below the knee. Patient states no chest pain, SOB, rash. Patient states the knee is swollen all the time and does not go down with elevation.

## 2024-05-24 NOTE — Telephone Encounter (Signed)
 Spoke to pt she is going to get injection at [facility] today in her knee and she is taking the Potassium, will pick up Magnesium  and schedule 6 wk lab to recheck Magnesium  levels  see result note 05/24/24

## 2024-05-26 NOTE — Assessment & Plan Note (Signed)
 Patient BP  Vitals:   05/14/24 1553  BP: 136/82  Current medications include metoprolol  25 mg BID  and losartan  25 mg . Amlodipine  discontinued due to leg cramps. High salt intake noted. - Encourage reduction of salt intake. - Promote healthy eating habits. - Advise on increasing fluid intake.

## 2024-05-26 NOTE — Assessment & Plan Note (Addendum)
 Negative strep.  - Given symptoms will start on prednisone . -Advised salt water gargles.  -She wil let us  know if symptoms not improving.

## 2024-05-30 ENCOUNTER — Encounter

## 2024-06-05 ENCOUNTER — Ambulatory Visit: Admitting: Family

## 2024-06-06 ENCOUNTER — Other Ambulatory Visit: Payer: Self-pay | Admitting: Family

## 2024-06-06 ENCOUNTER — Ambulatory Visit: Admitting: Family

## 2024-06-06 DIAGNOSIS — J029 Acute pharyngitis, unspecified: Secondary | ICD-10-CM

## 2024-06-06 MED ORDER — AMOXICILLIN-POT CLAVULANATE 875-125 MG PO TABS: 1.0000 | ORAL_TABLET | Freq: Two times a day (BID) | ORAL | 0 refills | Status: AC

## 2024-06-12 DIAGNOSIS — H2513 Age-related nuclear cataract, bilateral: Secondary | ICD-10-CM | POA: Diagnosis not present

## 2024-06-12 DIAGNOSIS — H20021 Recurrent acute iridocyclitis, right eye: Secondary | ICD-10-CM | POA: Diagnosis not present

## 2024-06-12 DIAGNOSIS — E119 Type 2 diabetes mellitus without complications: Secondary | ICD-10-CM | POA: Diagnosis not present

## 2024-06-12 LAB — HM DIABETES EYE EXAM

## 2024-06-18 ENCOUNTER — Ambulatory Visit
Admission: RE | Admit: 2024-06-18 | Discharge: 2024-06-18 | Disposition: A | Discharge status: Home / Self Care | Source: Ambulatory Visit | Attending: Family | Admitting: Family

## 2024-06-18 DIAGNOSIS — Z1231 Encounter for screening mammogram for malignant neoplasm of breast: Secondary | ICD-10-CM | POA: Insufficient documentation

## 2024-06-24 ENCOUNTER — Ambulatory Visit: Admitting: Podiatry

## 2024-06-24 DIAGNOSIS — Z91199 Patient's noncompliance with other medical treatment and regimen due to unspecified reason: Secondary | ICD-10-CM

## 2024-06-24 NOTE — Progress Notes (Signed)
 1. No-show for appointment

## 2024-07-01 ENCOUNTER — Telehealth: Payer: Self-pay

## 2024-07-01 NOTE — Telephone Encounter (Signed)
 Copied from CRM [id number]. Topic: General - Other >> Jul 01, 2024  3:44 PM Cynthia [name] wrote: Reason for CRM: Patient requesting a call from NP Arnetts nurse, she would like assistance setting up an appointment for ENT if possible due to her having issues when she starts to eat. Patient can be reached at [phone].

## 2024-07-01 NOTE — Telephone Encounter (Signed)
 Spoke to pt she would like a referral to ENT due to her still having a cough and it causing her to not be able to eat good because she is coughing so much

## 2024-07-02 ENCOUNTER — Other Ambulatory Visit: Payer: Self-pay | Admitting: Family

## 2024-07-02 DIAGNOSIS — R0981 Nasal congestion: Secondary | ICD-10-CM

## 2024-07-03 DIAGNOSIS — D3701 Neoplasm of uncertain behavior of lip: Secondary | ICD-10-CM | POA: Diagnosis not present

## 2024-07-03 DIAGNOSIS — R1314 Dysphagia, pharyngoesophageal phase: Secondary | ICD-10-CM | POA: Diagnosis not present

## 2024-07-03 DIAGNOSIS — K219 Gastro-esophageal reflux disease without esophagitis: Secondary | ICD-10-CM | POA: Diagnosis not present

## 2024-07-03 DIAGNOSIS — E041 Nontoxic single thyroid nodule: Secondary | ICD-10-CM | POA: Diagnosis not present

## 2024-07-03 DIAGNOSIS — R0683 Snoring: Secondary | ICD-10-CM | POA: Diagnosis not present

## 2024-07-03 NOTE — Telephone Encounter (Signed)
 LVM to call back to discuss message below per Rollene

## 2024-07-05 ENCOUNTER — Encounter: Payer: Self-pay | Admitting: Podiatry

## 2024-07-05 ENCOUNTER — Ambulatory Visit: Admitting: Podiatry

## 2024-07-05 DIAGNOSIS — E1142 Type 2 diabetes mellitus with diabetic polyneuropathy: Secondary | ICD-10-CM

## 2024-07-05 DIAGNOSIS — B351 Tinea unguium: Secondary | ICD-10-CM

## 2024-07-05 DIAGNOSIS — M2011 Hallux valgus (acquired), right foot: Secondary | ICD-10-CM

## 2024-07-05 DIAGNOSIS — M79675 Pain in left toe(s): Secondary | ICD-10-CM | POA: Diagnosis not present

## 2024-07-05 DIAGNOSIS — E119 Type 2 diabetes mellitus without complications: Secondary | ICD-10-CM

## 2024-07-05 DIAGNOSIS — Z0189 Encounter for other specified special examinations: Secondary | ICD-10-CM

## 2024-07-05 DIAGNOSIS — M79674 Pain in right toe(s): Secondary | ICD-10-CM

## 2024-07-05 DIAGNOSIS — M2012 Hallux valgus (acquired), left foot: Secondary | ICD-10-CM

## 2024-07-05 NOTE — Telephone Encounter (Signed)
 LVM 2nd time to call back to discuss message below per Rollene

## 2024-07-08 ENCOUNTER — Telehealth: Payer: Self-pay | Admitting: Family

## 2024-07-08 DIAGNOSIS — E039 Hypothyroidism, unspecified: Secondary | ICD-10-CM

## 2024-07-08 NOTE — Telephone Encounter (Signed)
Pt notified. Lab order placed

## 2024-07-08 NOTE — Telephone Encounter (Signed)
 Call patient Reviewed TSH lab from [facility].  It was elevated.  Please order TSH,fT4, fT3 and schedule in 4 to 6 weeks.

## 2024-07-09 NOTE — Telephone Encounter (Signed)
 LVM to call back to discuss message below per Rollene

## 2024-07-09 NOTE — Telephone Encounter (Signed)
 Spoke to pt she has seen ENT already he gave her a rx and she stated that it is helping a lot she is not coughing anymore like she was

## 2024-07-11 NOTE — Progress Notes (Signed)
  Subjective:  Patient ID: Cynthia [name], female    DOB: 15-Oct-1945,  MRN: [id number]  Cynthia [name] presents to clinic today for for annual diabetic foot examination, at risk foot care with history of diabetic neuropathy, and painful mycotic toenails x 10 which interfere with daily activities. Pain is relieved with periodic professional debridement.  Chief Complaint  Patient presents with   Toe Pain    A1c 6.8. NP Arnett is her PCP. Last visit was in August.    New problem(s): None.   PCP is Arnett, Rollene MATSU, FNP.  No Known Allergies  Review of Systems: Negative except as noted in the HPI.  Objective:  There were no vitals filed for this visit. Cynthia [name] is a pleasant 78 y.o. female in NAD. AAO x 3.   Diabetic foot exam was performed with the following findings:   Normal sensation of 10g monofilament Intact posterior tibialis and dorsalis pedis pulses Vascular Examination: Capillary refill time immediate b/l. DP pulses palpable b/l. PT pulses faintly palpable.b/l  Pedal hair sparse b/l. No pain with calf compression b/l. Skin temperature gradient WNL b/l. No cyanosis or clubbing b/l. No ischemia or gangrene noted b/l. No edema noted b/l LE.  Neurological Examination: Sensation grossly intact b/l with 10 gram monofilament. Vibratory sensation intact b/l. Pt has subjective symptoms of neuropathy.  Dermatological Examination: Pedal skin with normal turgor, texture and tone b/l.  No open wounds. No interdigital macerations.   Toenails 1-5 b/l thick, discolored, elongated with subungual debris and pain on dorsal palpation.   No hyperkeratotic nor porokeratotic lesions.  Musculoskeletal Examination: Muscle strength 5/5 to all lower extremity muscle groups bilaterally. HAV with bunion deformity noted b/l LE.  Radiographs: None    Assessment/Plan: 1. Pain due to onychomycosis of toenails of both feet   2. Hallux valgus, acquired, bilateral   3. Diabetic  peripheral neuropathy associated with type 2 diabetes mellitus (HCC)   4. Encounter for diabetic foot exam (HCC)    Diabetic foot examination performed today.  All patient's and/or POA's questions/concerns addressed on today's visit. Mycotic toenails 1-5 b/l debrided in length and girth without incident. Continue daily foot inspections and monitor blood glucose per PCP/Endocrinologist's recommendations. Continue soft, supportive shoe gear daily. Report any pedal injuries to medical professional. Call office if there are any questions/concerns. -Patient/POA to call should there be question/concern in the interim.   Return in about 10 weeks (around 09/13/2024).  Delon LITTIE Merlin, DPM      [place] LOCATION: [address] [place] [address]                   Office [phone]   [facility] LOCATION: [address] [place], [place] [address] Office [phone]

## 2024-07-11 NOTE — Progress Notes (Incomplete)
  Subjective:  Patient ID: Cynthia [name], female    DOB: 03-19-1946,  MRN: [id number]  Cynthia [name] presents to clinic today for for annual diabetic foot examination, at risk foot care with history of diabetic neuropathy, and painful mycotic toenails x 10 which interfere with daily activities. Pain is relieved with periodic professional debridement.  Chief Complaint  Patient presents with  . Toe Pain    A1c 6.8. NP Arnett is her PCP. Last visit was in August.    New problem(s): None.   PCP is Arnett, Rollene MATSU, FNP.  No Known Allergies  Review of Systems: Negative except as noted in the HPI.  Objective: No changes noted in today's physical examination. There were no vitals filed for this visit. Cynthia [name] is a pleasant 78 y.o. female in NAD. AAO x 3.  {Perform Simple Foot Exam  Perform Detailed exam:1} {Insert foot Exam (Optional):30965}  Assessment/Plan: No diagnosis found.  No orders of the defined types were placed in this encounter.   None {Jgplan:23602::-Patient/POA to call should there be question/concern in the interim.}   Return in about 3 months (around 10/05/2024).  Delon LITTIE Merlin, DPM      [place] LOCATION: [address] [place] [address]                   Office [phone]   [facility] LOCATION: [address] [place], [place] [address] Office [phone]

## 2024-07-12 DIAGNOSIS — R079 Chest pain, unspecified: Secondary | ICD-10-CM | POA: Diagnosis not present

## 2024-07-12 DIAGNOSIS — I2089 Other forms of angina pectoris: Secondary | ICD-10-CM | POA: Diagnosis not present

## 2024-07-12 DIAGNOSIS — I10 Essential (primary) hypertension: Secondary | ICD-10-CM | POA: Diagnosis not present

## 2024-07-12 DIAGNOSIS — R0602 Shortness of breath: Secondary | ICD-10-CM | POA: Diagnosis not present

## 2024-07-12 DIAGNOSIS — E782 Mixed hyperlipidemia: Secondary | ICD-10-CM | POA: Diagnosis not present

## 2024-07-12 DIAGNOSIS — N182 Chronic kidney disease, stage 2 (mild): Secondary | ICD-10-CM | POA: Diagnosis not present

## 2024-07-12 DIAGNOSIS — J441 Chronic obstructive pulmonary disease with (acute) exacerbation: Secondary | ICD-10-CM | POA: Diagnosis not present

## 2024-07-12 DIAGNOSIS — Z87891 Personal history of nicotine dependence: Secondary | ICD-10-CM | POA: Diagnosis not present

## 2024-07-12 DIAGNOSIS — E1140 Type 2 diabetes mellitus with diabetic neuropathy, unspecified: Secondary | ICD-10-CM | POA: Diagnosis not present

## 2024-07-15 ENCOUNTER — Other Ambulatory Visit: Payer: Self-pay | Admitting: Otolaryngology

## 2024-07-15 DIAGNOSIS — R053 Chronic cough: Secondary | ICD-10-CM

## 2024-07-15 DIAGNOSIS — R1310 Dysphagia, unspecified: Secondary | ICD-10-CM

## 2024-07-16 ENCOUNTER — Other Ambulatory Visit: Payer: Self-pay | Admitting: Internal Medicine

## 2024-07-16 DIAGNOSIS — R0602 Shortness of breath: Secondary | ICD-10-CM

## 2024-07-16 DIAGNOSIS — R079 Chest pain, unspecified: Secondary | ICD-10-CM

## 2024-07-16 DIAGNOSIS — I2089 Other forms of angina pectoris: Secondary | ICD-10-CM

## 2024-07-24 ENCOUNTER — Ambulatory Visit: Admitting: Family

## 2024-07-24 ENCOUNTER — Encounter: Payer: Self-pay | Admitting: Family

## 2024-07-24 ENCOUNTER — Telehealth: Payer: Self-pay | Admitting: Family

## 2024-07-24 VITALS — BP 130/64 | HR 81 | Temp 98.7°F | Ht 68.0 in | Wt 216.4 lb

## 2024-07-24 DIAGNOSIS — J209 Acute bronchitis, unspecified: Secondary | ICD-10-CM

## 2024-07-24 DIAGNOSIS — E039 Hypothyroidism, unspecified: Secondary | ICD-10-CM

## 2024-07-24 DIAGNOSIS — J440 Chronic obstructive pulmonary disease with acute lower respiratory infection: Secondary | ICD-10-CM | POA: Diagnosis not present

## 2024-07-24 DIAGNOSIS — E1140 Type 2 diabetes mellitus with diabetic neuropathy, unspecified: Secondary | ICD-10-CM | POA: Diagnosis not present

## 2024-07-24 DIAGNOSIS — I10 Essential (primary) hypertension: Secondary | ICD-10-CM | POA: Diagnosis not present

## 2024-07-24 LAB — CBC WITH DIFFERENTIAL/PLATELET
Basophils Absolute: 0.1 K/uL (ref 0.0–0.1)
Basophils Relative: 1.3 % (ref 0.0–3.0)
Eosinophils Absolute: 0.4 K/uL (ref 0.0–0.7)
Eosinophils Relative: 7.6 % — ABNORMAL HIGH (ref 0.0–5.0)
HCT: 40.3 % (ref 36.0–46.0)
Hemoglobin: 13.4 g/dL (ref 12.0–15.0)
Lymphocytes Relative: 30.1 % (ref 12.0–46.0)
Lymphs Abs: 1.4 K/uL (ref 0.7–4.0)
MCHC: 33.1 g/dL (ref 30.0–36.0)
MCV: 94.3 fl (ref 78.0–100.0)
Monocytes Absolute: 0.8 K/uL (ref 0.1–1.0)
Monocytes Relative: 17.7 % — ABNORMAL HIGH (ref 3.0–12.0)
Neutro Abs: 2 K/uL (ref 1.4–7.7)
Neutrophils Relative %: 43.3 % (ref 43.0–77.0)
Platelets: 310 K/uL (ref 150.0–400.0)
RBC: 4.28 Mil/uL (ref 3.87–5.11)
RDW: 13.7 % (ref 11.5–15.5)
WBC: 4.7 K/uL (ref 4.0–10.5)

## 2024-07-24 LAB — MAGNESIUM: Magnesium: 1.4 mg/dL — ABNORMAL LOW (ref 1.5–2.5)

## 2024-07-24 MED ORDER — DOXYCYCLINE HYCLATE 100 MG PO TABS: 100.0000 mg | ORAL_TABLET | Freq: Two times a day (BID) | ORAL | 0 refills | Status: AC

## 2024-07-24 NOTE — Progress Notes (Signed)
 Assessment & Plan:  Acute bronchitis with COPD (HCC) Assessment & Plan: No acute respiratory distress.  She is not labored in speech.  No wheezing on exam.  We jointly agreed to start with doxycycline today and defer use of prednisone .  Advised to start probiotics.  Patient will call me tomorrow to let me know how she is doing.  We also agreed to defer chest x-ray at this time.  Close follow-up in 1 week, sooner if needed due to history of respiratory failure, COPD.  Reiterated the importance of remaining vigilant and if chest pain were to recur advised to present to the emergency room.  Fortunately stress test, echocardiogram and CT calcium  score are scheduled through Dr. Florencio.   Orders: -     Doxycycline Hyclate; Take 1 tablet (100 mg total) by mouth 2 (two) times daily for 7 days.  Dispense: 14 tablet; Refill: 0  Type 2 diabetes mellitus with diabetic neuropathy, without long-term current use of insulin  (HCC) -     CBC with Differential/Platelet  Hypothyroidism, unspecified type -     TSH -     T4, free -     T3, free  Primary hypertension -     Magnesium      Return precautions given.   Risks, benefits, and alternatives of the medications and treatment plan prescribed today were discussed, and patient expressed understanding.   Education regarding symptom management and diagnosis given to patient on AVS either electronically or printed.  Return in about 1 week (around 07/31/2024).  Rollene Northern, FNP  Subjective:    Patient ID: Cynthia [name], female    DOB: [birth year], 78 y.o.   MRN: [id number]  CC: Cynthia [name] is a 78 y.o. female who presents today for an acute visit.    HPI: HPI Discussed the use of AI scribe software for clinical note transcription with the patient, who gave verbal consent to proceed.  History of Present Illness   Cynthia [name] is a 78 year old female with COPD who presents with chest congestion and cough.  She has been  experiencing chest congestion and a productive cough with green sputum for the past four days, with increased sputum production and more frequent coughing. She feels more short of breath. Denies  leg swelling,wheezing, fever, body aches, or chest pain. A sensation of someone 'standing on her chest' was noted a few days ago, which has since resolved.   She has a history of COPD and was hospitalized in January for a COPD exacerbation, during which she received Tamiflu  and steroids. She is currently taking Breztri  and albuterol  inhalers. She was previously prescribed amoxicillin  in August and has not taken prednisone  recently.  She is on omeprazole  20 mg for acid reflux, prescribed by [facility], but reports inconsistent relief from symptoms.  Denies epigastric burning, belching   She has several upcoming medical appointments, including a stress test and an ultrasound of the heart scheduled for November 10th, and a swallow test on November 7th. She is also scheduled for a CT scan of the heart in December.     Cardiology consult 07/12/24 discussed shortness of breath elevated weight loss.  Pending echocardiogram for shortness of breath and angina.  Recommended CT calcium  score, Lexiscan Myoview; follow-up in 1 month Seen by [facility] and diagnosed with GERD; consult 07/05/24 ( no notes in chart)   Former smoker Last seen 04/24/2024 for cough; prescribed amoxicillin  Previously seen 04/15/2024 for hoarseness and started on azelastine  Last  seen pulmonology 01/30/2024, Dr. Nicklas She remains compliant with Bevespi  2 puffs twice daily and albuterol  as needed   Of note, never started magnesium  Mag 1.4 04/15/24  Of note, admitted 1/25 for influenza and COPD exacerbation. Allergies: Patient has no known allergies. Current Outpatient Medications on File Prior to Visit  Medication Sig Dispense Refill   albuterol  (VENTOLIN  HFA) 108 (90 Base) MCG/ACT inhaler TAKE 2 PUFFS BY MOUTH EVERY 6 HOURS AS NEEDED  FOR WHEEZE OR SHORTNESS OF BREATH 8.5 each 11   aspirin  EC 81 MG tablet Take 81 mg by mouth daily. Swallow whole.     atorvastatin  (LIPITOR) 20 MG tablet TAKE 1 TABLET BY MOUTH EVERY DAY 90 tablet 1   azelastine  (ASTELIN ) 0.1 % nasal spray Place 1 spray into both nostrils 2 (two) times daily. Use in each nostril as directed 30 mL 4   blood glucose meter kit and supplies KIT Dispense based on patient and insurance preference. Use up to four times daily as directed. 1 each 0   budeson-glycopyrrolate-formoterol  (BREZTRI  AEROSPHERE) 160-9-4.8 MCG/ACT AERO Inhale 2 puffs into the lungs in the morning and at bedtime. 3 each 3   Calcium  Carbonate-Vit D-Min (CALCIUM  600+D PLUS MINERALS) 600-400 MG-UNIT TABS Take 1 tablet by mouth daily.      cyanocobalamin  (VITAMIN B12) 1000 MCG tablet Take 1 tablet (1,000 mcg total) by mouth daily. 90 tablet 3   DULoxetine  (CYMBALTA ) 60 MG capsule TAKE 1 CAPSULE BY MOUTH EVERY DAY 90 capsule 3   Lancet Devices (ONE TOUCH DELICA LANCING DEV) MISC 1 Device by Does not apply route daily. 1 each 2   Lancets (ONETOUCH DELICA PLUS LANCET30G) MISC USE AS INSTRUCTED 100 each 12   levothyroxine  (SYNTHROID ) 112 MCG tablet Take 1 tablet (112 mcg total) by mouth daily. 30 tablet 11   losartan  (COZAAR ) 50 MG tablet TAKE 1 TABLET BY MOUTH EVERY DAY 90 tablet 1   magnesium  oxide (MAG-OX) 400 MG tablet Take 1 tablet (400 mg total) by mouth 2 (two) times daily. 180 tablet 3   metFORMIN  (GLUCOPHAGE ) 1000 MG tablet Take 1 tablet (1,000 mg total) by mouth 2 (two) times daily with a meal. 180 tablet 3   metoprolol  tartrate (LOPRESSOR ) 25 MG tablet Take 1 tablet (25 mg total) by mouth 2 (two) times daily. 90 tablet 1   omeprazole  (PRILOSEC) 20 MG capsule Take 20 mg by mouth daily.     ONETOUCH ULTRA test strip TEST BLOOD SUGAR 1-2 TIMES A DAY 100 strip 5   potassium chloride  SA (KLOR-CON  M) 20 MEQ tablet Take 1 tablet (20 mEq total) by mouth daily. 30 tablet 3   traMADol  (ULTRAM ) 50 MG tablet  Take 1 tablet (50 mg total) by mouth every 12 (twelve) hours as needed for severe pain (pain score 7-10). 60 tablet 2   No current facility-administered medications on file prior to visit.    Review of Systems  Constitutional:  Negative for chills and fever.  HENT:  Positive for congestion and sinus pain. Negative for sore throat.   Respiratory:  Positive for cough. Negative for shortness of breath and wheezing.   Cardiovascular:  Negative for chest pain, palpitations and leg swelling.  Gastrointestinal:  Negative for nausea and vomiting.      Objective:    BP 130/64   Pulse 81   Temp 98.7 F (37.1 C) (Oral)   Ht 5' 8 (1.727 m)   Wt 216 lb 6.4 oz (98.2 kg)   SpO2 99%   BMI  32.90 kg/m   BP Readings from Last 3 Encounters:  07/24/24 130/64  05/14/24 136/82  04/25/24 130/82   Wt Readings from Last 3 Encounters:  07/24/24 216 lb 6.4 oz (98.2 kg)  05/14/24 217 lb 3.2 oz (98.5 kg)  05/06/24 217 lb (98.4 kg)    Physical Exam Vitals reviewed.  Constitutional:      Appearance: She is well-developed.  HENT:     Head: Normocephalic and atraumatic.     Right Ear: Hearing, tympanic membrane, ear canal and external ear normal. No decreased hearing noted. No drainage, swelling or tenderness. No middle ear effusion. No foreign body. Tympanic membrane is not erythematous or bulging.     Left Ear: Hearing, tympanic membrane, ear canal and external ear normal. No decreased hearing noted. No drainage, swelling or tenderness.  No middle ear effusion. No foreign body. Tympanic membrane is not erythematous or bulging.     Nose: Nose normal. No rhinorrhea.     Right Sinus: No maxillary sinus tenderness or frontal sinus tenderness.     Left Sinus: No maxillary sinus tenderness or frontal sinus tenderness.     Mouth/Throat:     Pharynx: Uvula midline. No oropharyngeal exudate or posterior oropharyngeal erythema.     Tonsils: No tonsillar abscesses.  Eyes:     Conjunctiva/sclera:  Conjunctivae normal.  Cardiovascular:     Rate and Rhythm: Regular rhythm.     Pulses: Normal pulses.     Heart sounds: Normal heart sounds.  Pulmonary:     Effort: Pulmonary effort is normal.     Breath sounds: Normal breath sounds. No wheezing, rhonchi or rales.     Comments: Coarse lung sounds when asked to cough.  No wheezing. Musculoskeletal:     Right lower leg: No edema.     Left lower leg: No edema.  Lymphadenopathy:     Head:     Right side of head: No submental, submandibular, tonsillar, preauricular, posterior auricular or occipital adenopathy.     Left side of head: No submental, submandibular, tonsillar, preauricular, posterior auricular or occipital adenopathy.     Cervical: No cervical adenopathy.  Skin:    General: Skin is warm and dry.  Neurological:     Mental Status: She is alert.  Psychiatric:        Speech: Speech normal.        Behavior: Behavior normal.        Thought Content: Thought content normal.

## 2024-07-24 NOTE — Assessment & Plan Note (Addendum)
 No acute respiratory distress.  She is not labored in speech.  No wheezing on exam.  We jointly agreed to start with doxycycline today and defer use of prednisone .  Advised to start probiotics.  Patient will call me tomorrow to let me know how she is doing.  We also agreed to defer chest x-ray at this time.  Close follow-up in 1 week, sooner if needed due to history of respiratory failure, COPD.  Reiterated the importance of remaining vigilant and if chest pain were to recur advised to present to the emergency room.  Fortunately stress test, echocardiogram and CT calcium  score are scheduled through Dr. Florencio.

## 2024-07-24 NOTE — Telephone Encounter (Signed)
 Call [facility]  consult 07/05/24 ( no notes in chart)   Please get OV notes

## 2024-07-24 NOTE — Telephone Encounter (Signed)
 Lm that patient's appointment time has changed to 10 am on 08/01/2024.

## 2024-07-24 NOTE — Telephone Encounter (Signed)
 Spoke to [facility] they will fax over ov notes from 07/03/24

## 2024-07-24 NOTE — Patient Instructions (Signed)
 Please start over-the-counter Mucinex .  Please the plain version of Mucinex .  The generic form is guaifenesin .  Please drink plenty of water to help break up thick mucus. I also sent in doxycycline which is an antibiotic.  Please take this antibiotic with food as it may cause nausea. Ensure to take probiotics while on antibiotics and also for 2 weeks after completion. This can either be by eating yogurt daily or taking a probiotic supplement over the counter such as Culturelle.It is important to re-colonize the gut with good bacteria and also to prevent any diarrheal infections associated with antibiotic use.   As discussed at length, if you develop shortness of breath or chest pain, immediately call 911 or present to emergency room.  Please call me tomorrow with an update.  We did not start a prednisone  taper or order a chest x-ray at this time.  Please let me know how you are feeling

## 2024-07-25 ENCOUNTER — Ambulatory Visit: Payer: Self-pay | Admitting: Family

## 2024-07-25 LAB — T4, FREE: Free T4: 0.92 ng/dL (ref 0.60–1.60)

## 2024-07-25 LAB — T3, FREE: T3, Free: 3.2 pg/mL (ref 2.3–4.2)

## 2024-07-25 LAB — TSH: TSH: 3.38 u[IU]/mL (ref 0.35–5.50)

## 2024-07-25 MED ORDER — MAGNESIUM OXIDE 400 MG PO TABS: 400.0000 mg | ORAL_TABLET | Freq: Two times a day (BID) | ORAL | 3 refills | Status: AC

## 2024-07-26 ENCOUNTER — Ambulatory Visit
Admission: RE | Admit: 2024-07-26 | Discharge: 2024-07-26 | Disposition: A | Discharge status: Home / Self Care | Source: Ambulatory Visit | Attending: Otolaryngology | Admitting: Otolaryngology

## 2024-07-26 DIAGNOSIS — R053 Chronic cough: Secondary | ICD-10-CM | POA: Insufficient documentation

## 2024-07-26 DIAGNOSIS — R1310 Dysphagia, unspecified: Secondary | ICD-10-CM | POA: Insufficient documentation

## 2024-07-26 NOTE — Procedures (Signed)
 Modified Barium Swallow Study  Patient Details  Name: Cynthia [name] MRN: [id number] Date of Birth: [birth year]  Today's Date: 07/26/2024  Modified Barium Swallow completed.  Full report located under Chart Review in the Imaging Section.  History of Present Illness Pt is a 78 year old female who was referred by her ENT, Dr Carolee Hunter, for a Modified Barium Swallow study d/t compliant of cough. 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   Clinical Impression Pt presents with adequate oropharyngeal abilities when consuming thin liquids (spoon and cup sips), nectar thick liquids (spoon and cup sips) and 1 bolus of puree. Post swallow of puree bolus, pt was observed with retrograde movement of barium from esophagus up into pharynx. Despite this retrograde movement, pt with good airway protection and re-swallow. Given retrograde movement of barium esophageal sweep was performed laterally and anteriorally by radiologist. Barium was observed throughout esophagus (see radiologist report). Study was terminated d/t continued presence of barium within esophagus. Results of this study and as well recommendation for potential barium swallow and GI consult were communicated via secure chat to referring ENT.  Reflux precautions were shared with pt.   Factors that may increase risk of adverse event in presence of aspiration [employer]e [employer] Lianne 2021):  (appearance of stasis of barium throughout the esophagus with retrograde movement into pharynx)  Swallow Evaluation Recommendations Recommendations:  (see impressions statement) Supervision: Patient able to self-feed Swallowing strategies  : Minimize environmental distractions;Slow rate;Small bites/sips Postural changes: Position pt fully upright for meals;Stay upright 30-60 min after meals Oral care recommendations: Oral  care BID (2x/day) Recommended consults: Consider GI consultation;Consider esophageal assessment    Maxtyn Nuzum B. Rubbie, M.S., CCC-SLP, Tree Surgeon Certified Brain Injury Specialist [facility]  [facility] Office [phone] Ascom [phone] Fax [phone]

## 2024-07-29 ENCOUNTER — Other Ambulatory Visit: Payer: Self-pay | Admitting: Family

## 2024-08-01 ENCOUNTER — Ambulatory Visit (INDEPENDENT_AMBULATORY_CARE_PROVIDER_SITE_OTHER): Admitting: Family

## 2024-08-01 ENCOUNTER — Encounter: Payer: Self-pay | Admitting: Family

## 2024-08-01 ENCOUNTER — Ambulatory Visit: Admitting: Family

## 2024-08-01 VITALS — BP 136/80 | HR 83 | Temp 98.3°F | Ht 68.0 in | Wt 213.6 lb

## 2024-08-01 DIAGNOSIS — J209 Acute bronchitis, unspecified: Secondary | ICD-10-CM

## 2024-08-01 DIAGNOSIS — R899 Unspecified abnormal finding in specimens from other organs, systems and tissues: Secondary | ICD-10-CM | POA: Diagnosis not present

## 2024-08-01 DIAGNOSIS — J440 Chronic obstructive pulmonary disease with acute lower respiratory infection: Secondary | ICD-10-CM | POA: Diagnosis not present

## 2024-08-01 MED ORDER — AZITHROMYCIN 250 MG PO TABS: ORAL_TABLET | ORAL | 0 refills | Status: AC

## 2024-08-01 NOTE — Assessment & Plan Note (Signed)
 No acute respiratory distress.  No wheezing on exam.  Patient and I jointly agreed to defer chest x-ray at this time.  Intolerance doxycycline.  Start azithromycin .  Reiterated the importance of probiotics.  She will call me right away if symptoms do not respond quickly to azithromycin  as we agreed we would obtain chest x-ray

## 2024-08-01 NOTE — Progress Notes (Signed)
 Assessment & Plan:  Acute bronchitis with COPD (HCC) Assessment & Plan: No acute respiratory distress.  No wheezing on exam.  Patient and I jointly agreed to defer chest x-ray at this time.  Intolerance doxycycline.  Start azithromycin .  Reiterated the importance of probiotics.  She will call me right away if symptoms do not respond quickly to azithromycin  as we agreed we would obtain chest x-ray  Orders: -     Azithromycin ; Take 2 tablets on day 1, then 1 tablet daily on days 2 through 5  Dispense: 6 tablet; Refill: 0  Abnormal laboratory test -     CBC with Differential/Platelet; Future -     Magnesium ; Future     Return precautions given.   Risks, benefits, and alternatives of the medications and treatment plan prescribed today were discussed, and patient expressed understanding.   Education regarding symptom management and diagnosis given to patient on AVS either electronically or printed.  Return in about 4 months (around 11/29/2024).  Rollene Northern, FNP  Subjective:    Patient ID: Cynthia [name], female    DOB: 01-15-1946, 78 y.o.   MRN: [id number]  CC: Cynthia [name] is a 78 y.o. female who presents today for follow up and to discuss labs from 07/24/24.   HPI: HPI Discussed the use of AI scribe software for clinical note transcription with the patient, who gave verbal consent to proceed.  History of Present Illness   Cynthia [name] is a 78 year old female who presents for a follow-up visit for bronchitis.  She was previously started on doxycycline but discontinued it after four days due to experiencing cramps in her ankle, which she associated with the medication. Cramping resolved.   Her cough remains unchanged, and she has no fever, chest pain, or worsening of symptoms.  She feels shortness of breath at her baseline. Her cough is productive, with congestion primarily in her chest.  Denies sinus pain, ear pain, sore throat, chest pain, leg swelling.   Recent  lab work showed low magnesium  levels, which she is addressing with supplementation. She is currently taking Mucinex  to manage her symptoms.        Follow-up bronchitis, after starting doxycycline Allergies: Patient has no known allergies. Current Outpatient Medications on File Prior to Visit  Medication Sig Dispense Refill   albuterol  (VENTOLIN  HFA) 108 (90 Base) MCG/ACT inhaler TAKE 2 PUFFS BY MOUTH EVERY 6 HOURS AS NEEDED FOR WHEEZE OR SHORTNESS OF BREATH 8.5 each 11   aspirin  EC 81 MG tablet Take 81 mg by mouth daily. Swallow whole.     atorvastatin  (LIPITOR) 20 MG tablet TAKE 1 TABLET BY MOUTH EVERY DAY 90 tablet 1   azelastine  (ASTELIN ) 0.1 % nasal spray Place 1 spray into both nostrils 2 (two) times daily. Use in each nostril as directed 30 mL 4   blood glucose meter kit and supplies KIT Dispense based on patient and insurance preference. Use up to four times daily as directed. 1 each 0   budeson-glycopyrrolate-formoterol  (BREZTRI  AEROSPHERE) 160-9-4.8 MCG/ACT AERO Inhale 2 puffs into the lungs in the morning and at bedtime. 3 each 3   Calcium  Carbonate-Vit D-Min (CALCIUM  600+D PLUS MINERALS) 600-400 MG-UNIT TABS Take 1 tablet by mouth daily.      cyanocobalamin  (VITAMIN B12) 1000 MCG tablet Take 1 tablet (1,000 mcg total) by mouth daily. 90 tablet 3   DULoxetine  (CYMBALTA ) 60 MG capsule TAKE 1 CAPSULE BY MOUTH EVERY DAY 90 capsule 3   Lancet  Devices (ONE TOUCH DELICA LANCING DEV) MISC 1 Device by Does not apply route daily. 1 each 2   Lancets (ONETOUCH DELICA PLUS LANCET30G) MISC USE AS INSTRUCTED 100 each 12   levothyroxine  (SYNTHROID ) 112 MCG tablet Take 1 tablet (112 mcg total) by mouth daily. 30 tablet 11   losartan  (COZAAR ) 50 MG tablet TAKE 1 TABLET BY MOUTH EVERY DAY 90 tablet 1   magnesium  oxide (MAG-OX) 400 MG tablet Take 1 tablet (400 mg total) by mouth 2 (two) times daily. 180 tablet 3   metFORMIN  (GLUCOPHAGE ) 1000 MG tablet Take 1 tablet (1,000 mg total) by mouth 2 (two)  times daily with a meal. 180 tablet 3   metoprolol  tartrate (LOPRESSOR ) 25 MG tablet Take 1 tablet (25 mg total) by mouth 2 (two) times daily. 90 tablet 1   omeprazole  (PRILOSEC) 20 MG capsule Take 20 mg by mouth daily.     ONETOUCH ULTRA test strip TEST BLOOD SUGAR 1-2 TIMES A DAY 100 strip 5   potassium chloride  SA (KLOR-CON  M) 20 MEQ tablet TAKE 1 TABLET BY MOUTH EVERY DAY 30 tablet 3   traMADol  (ULTRAM ) 50 MG tablet Take 1 tablet (50 mg total) by mouth every 12 (twelve) hours as needed for severe pain (pain score 7-10). 60 tablet 2   No current facility-administered medications on file prior to visit.    Review of Systems  Constitutional:  Negative for chills and fever.  HENT:  Positive for congestion. Negative for ear pain, rhinorrhea, sinus pressure and sinus pain.   Respiratory:  Positive for cough. Negative for shortness of breath.   Cardiovascular:  Negative for chest pain, palpitations and leg swelling.  Gastrointestinal:  Negative for nausea and vomiting.      Objective:    BP 136/80   Pulse 83   Temp 98.3 F (36.8 C) (Oral)   Ht 5' 8 (1.727 m)   Wt 213 lb 9.6 oz (96.9 kg)   SpO2 97%   BMI 32.48 kg/m  BP Readings from Last 3 Encounters:  08/01/24 136/80  07/24/24 130/64  05/14/24 136/82   Wt Readings from Last 3 Encounters:  08/01/24 213 lb 9.6 oz (96.9 kg)  07/24/24 216 lb 6.4 oz (98.2 kg)  05/14/24 217 lb 3.2 oz (98.5 kg)    Physical Exam Vitals reviewed.  Constitutional:      Appearance: She is well-developed.  Eyes:     Conjunctiva/sclera: Conjunctivae normal.  Cardiovascular:     Rate and Rhythm: Normal rate and regular rhythm.     Pulses: Normal pulses.     Heart sounds: Normal heart sounds.  Pulmonary:     Effort: Pulmonary effort is normal.     Breath sounds: Normal breath sounds. No wheezing, rhonchi or rales.     Comments: Productive cough in room. Skin:    General: Skin is warm and dry.  Neurological:     Mental Status: She is alert.   Psychiatric:        Speech: Speech normal.        Behavior: Behavior normal.        Thought Content: Thought content normal.

## 2024-08-01 NOTE — Patient Instructions (Addendum)
 You may stop doxycycline.  Start azithromycin   Ensure to take probiotics while on antibiotics and also for 2 weeks after completion. This can either be by eating yogurt daily or taking a probiotic supplement over the counter such as Culturelle.It is important to re-colonize the gut with good bacteria and also to prevent any diarrheal infections associated with antibiotic use.     Continue magnesium .  Will recheck magnesium  along with monocytes, eosinophils as discussed in 6 weeks time

## 2024-08-05 ENCOUNTER — Other Ambulatory Visit

## 2024-08-06 ENCOUNTER — Ambulatory Visit
Admission: RE | Admit: 2024-08-06 | Discharge: 2024-08-06 | Disposition: A | Discharge status: Home / Self Care | Payer: Self-pay | Source: Ambulatory Visit | Attending: Internal Medicine | Admitting: Internal Medicine

## 2024-08-06 ENCOUNTER — Telehealth: Payer: Self-pay

## 2024-08-06 DIAGNOSIS — I2089 Other forms of angina pectoris: Secondary | ICD-10-CM | POA: Insufficient documentation

## 2024-08-06 DIAGNOSIS — R079 Chest pain, unspecified: Secondary | ICD-10-CM | POA: Insufficient documentation

## 2024-08-06 DIAGNOSIS — R0602 Shortness of breath: Secondary | ICD-10-CM | POA: Insufficient documentation

## 2024-08-06 NOTE — Telephone Encounter (Signed)
 Lvm to inform pt lab appt for Friday was canceled pt not due for repeat labs til December. Okay to relay

## 2024-08-09 ENCOUNTER — Other Ambulatory Visit

## 2024-08-22 ENCOUNTER — Other Ambulatory Visit

## 2024-08-26 ENCOUNTER — Other Ambulatory Visit: Payer: Self-pay | Admitting: Family

## 2024-08-26 DIAGNOSIS — M545 Low back pain, unspecified: Secondary | ICD-10-CM

## 2024-08-26 NOTE — Telephone Encounter (Unsigned)
 Copied from CRM [id number]. Topic: Clinical - Medication Refill >> Aug 26, 2024  9:17 AM Eva FALCON wrote: Medication: traMADol  (ULTRAM ) 50 MG tablet  Has the patient contacted their pharmacy? Yes, states bottle has no refills. Wanted to call before weather gets bad.  (Agent: If no, request that the patient contact the pharmacy for the refill. If patient does not wish to contact the pharmacy document the reason why and proceed with request.) (Agent: If yes, when and what did the pharmacy advise?)  This is the patient's preferred pharmacy:  [facility] [address] [place] - [address] [place] [address] Phone: [phone] Fax: [phone]  Is this the correct pharmacy for this prescription? Yes If no, delete pharmacy and type the correct one.   Has the prescription been filled recently? No, had 90 day supply   Is the patient out of the medication? No-two pills left  Has the patient been seen for an appointment in the last year OR does the patient have an upcoming appointment? Yes  Can we respond through MyChart? No  Agent: Please be advised that Rx refills may take up to 3 business days. We ask that you follow-up with your pharmacy.

## 2024-08-27 MED ORDER — TRAMADOL HCL 50 MG PO TABS: 50.0000 mg | ORAL_TABLET | Freq: Two times a day (BID) | ORAL | 2 refills | Status: AC | PRN

## 2024-08-28 ENCOUNTER — Other Ambulatory Visit: Payer: Self-pay | Admitting: Family

## 2024-08-28 DIAGNOSIS — I10 Essential (primary) hypertension: Secondary | ICD-10-CM

## 2024-08-28 DIAGNOSIS — E785 Hyperlipidemia, unspecified: Secondary | ICD-10-CM

## 2024-09-03 ENCOUNTER — Other Ambulatory Visit: Payer: Self-pay | Admitting: Family

## 2024-09-03 MED ORDER — ONETOUCH DELICA PLUS LANCET30G MISC: 12 refills | Status: AC

## 2024-09-03 MED ORDER — ONETOUCH DELICA PLUS LANCING MISC: 1.0000 | Freq: Every day | 2 refills | Status: AC

## 2024-09-03 NOTE — Telephone Encounter (Signed)
 Copied from CRM #[id number]. Topic: Clinical - Medication Refill >> Sep 03, 2024 10:29 AM Alexandria E wrote: Medication: Lancets (ONETOUCH DELICA PLUS LANCET30G) MISC Lancet Devices (ONE TOUCH DELICA LANCING DEV) MISC   Has the patient contacted their pharmacy? No (Agent: If no, request that the patient contact the pharmacy for the refill. If patient does not wish to contact the pharmacy document the reason why and proceed with request.) (Agent: If yes, when and what did the pharmacy advise?)  This is the patient's preferred pharmacy:  [facility] [address], [place] - [address] [place] [address] Phone: [phone] Fax: [phone]  Is this the correct pharmacy for this prescription? Yes If no, delete pharmacy and type the correct one.   Has the prescription been filled recently? No  Is the patient out of the medication? Yes  Has the patient been seen for an appointment in the last year OR does the patient have an upcoming appointment? Yes  Can we respond through MyChart? No  Agent: Please be advised that Rx refills may take up to 3 business days. We ask that you follow-up with your pharmacy.

## 2024-09-03 NOTE — Telephone Encounter (Signed)
 Patient also requested refill of Lancet Devices. This RN was unable to pend this medication and received error, The medications or procedures for these orders are no longer available for ordering. Place new orders instead. Please advise.

## 2024-09-11 ENCOUNTER — Other Ambulatory Visit: Payer: Self-pay | Admitting: Family

## 2024-09-17 ENCOUNTER — Other Ambulatory Visit (INDEPENDENT_AMBULATORY_CARE_PROVIDER_SITE_OTHER)

## 2024-09-17 DIAGNOSIS — R899 Unspecified abnormal finding in specimens from other organs, systems and tissues: Secondary | ICD-10-CM | POA: Diagnosis not present

## 2024-09-17 DIAGNOSIS — C7A8 Other malignant neuroendocrine tumors: Secondary | ICD-10-CM

## 2024-09-17 LAB — CBC WITH DIFFERENTIAL/PLATELET
Basophils Absolute: 0.1 K/uL (ref 0.0–0.1)
Basophils Relative: 1.2 % (ref 0.0–3.0)
Eosinophils Absolute: 0.2 K/uL (ref 0.0–0.7)
Eosinophils Relative: 3.7 % (ref 0.0–5.0)
HCT: 39.9 % (ref 36.0–46.0)
Hemoglobin: 13.2 g/dL (ref 12.0–15.0)
Lymphocytes Relative: 31.9 % (ref 12.0–46.0)
Lymphs Abs: 1.7 K/uL (ref 0.7–4.0)
MCHC: 33 g/dL (ref 30.0–36.0)
MCV: 92.8 fl (ref 78.0–100.0)
Monocytes Absolute: 0.7 K/uL (ref 0.1–1.0)
Monocytes Relative: 14 % — ABNORMAL HIGH (ref 3.0–12.0)
Neutro Abs: 2.6 K/uL (ref 1.4–7.7)
Neutrophils Relative %: 49.2 % (ref 43.0–77.0)
Platelets: 289 K/uL (ref 150.0–400.0)
RBC: 4.3 Mil/uL (ref 3.87–5.11)
RDW: 14.8 % (ref 11.5–15.5)
WBC: 5.2 K/uL (ref 4.0–10.5)

## 2024-09-17 LAB — MAGNESIUM: Magnesium: 1.5 mg/dL (ref 1.5–2.5)

## 2024-09-24 ENCOUNTER — Other Ambulatory Visit: Payer: Self-pay | Admitting: Obstetrics and Gynecology

## 2024-09-25 ENCOUNTER — Ambulatory Visit: Payer: Self-pay | Admitting: Family

## 2024-09-27 ENCOUNTER — Telehealth: Payer: Self-pay

## 2024-09-27 NOTE — Telephone Encounter (Signed)
 Spoke to pt answered question regarding her medications

## 2024-09-27 NOTE — Telephone Encounter (Signed)
 Copied from CRM [id number]. Topic: Clinical - Medication Question >> Sep 27, 2024  9:37 AM Carlyon D wrote: Reason for CRM: Pt is calling in regards to a medication question she would like a call back states the pharmacy is telling her one thing but would like a call back from miss Arnett's nurse.

## 2024-09-30 ENCOUNTER — Encounter: Payer: Self-pay | Admitting: Podiatry

## 2024-09-30 ENCOUNTER — Ambulatory Visit: Admitting: Podiatry

## 2024-09-30 DIAGNOSIS — E1142 Type 2 diabetes mellitus with diabetic polyneuropathy: Secondary | ICD-10-CM | POA: Diagnosis not present

## 2024-09-30 DIAGNOSIS — B351 Tinea unguium: Secondary | ICD-10-CM

## 2024-09-30 DIAGNOSIS — M79675 Pain in left toe(s): Secondary | ICD-10-CM

## 2024-09-30 DIAGNOSIS — M79674 Pain in right toe(s): Secondary | ICD-10-CM | POA: Diagnosis not present

## 2024-10-04 ENCOUNTER — Other Ambulatory Visit: Payer: Self-pay | Admitting: Family

## 2024-10-04 DIAGNOSIS — E039 Hypothyroidism, unspecified: Secondary | ICD-10-CM

## 2024-10-07 ENCOUNTER — Other Ambulatory Visit: Payer: Self-pay

## 2024-10-07 MED ORDER — LEVOTHYROXINE SODIUM 112 MCG PO TABS: 112.0000 ug | ORAL_TABLET | Freq: Every day | ORAL | 3 refills | Status: AC

## 2024-10-07 NOTE — Telephone Encounter (Signed)
 Detailed vm left asking pt to confirm if she is taking 112 mcg of Synthroid  thyroid  medication    Please confirm that patient is taking Synthroid  112 mcg. I have refused this refill  -Arnett, NP    E2C2 IT IS OKAY TO ASK PT IF SHE IS TAKING 112 MCG OF SYNTHROID 

## 2024-10-07 NOTE — Telephone Encounter (Signed)
 PHONE NOTE CREATED ASKING PT TO CB

## 2024-10-07 NOTE — Telephone Encounter (Signed)
 Patient called in to confirm that she is taking levothyroxine  112mcg. Prescription pended for approval.  Last TSH 07/24/24 was 3.38

## 2024-10-07 NOTE — Addendum Note (Signed)
 Addended by: SEBASTIAN KNEE on: 10/07/2024 04:44 PM   Modules accepted: Orders

## 2024-10-07 NOTE — Progress Notes (Signed)
"  °  Subjective:  Patient ID: Cynthia [name], female    DOB: 1946/05/11,  MRN: [id number]  Cynthia [name] presents to clinic today for at risk foot care with history of diabetic neuropathy and painful thick toenails that are difficult to trim. Pain interferes with ambulation. Aggravating factors include wearing enclosed shoe gear. Pain is relieved with periodic professional debridement.  Chief Complaint  Patient presents with   Diabetes    A1c 6.8 She saw FNP Arnett in Oct   New problem(s): None.   PCP is Arnett, Rollene MATSU, FNP.  Allergies[1]  Review of Systems: Negative except as noted in the HPI.  Objective: No changes noted in today's physical examination. There were no vitals filed for this visit. Cynthia [name] is a pleasant 79 y.o. female obese in NAD. AAO x 3.  Normal sensation of 10g monofilament Intact posterior tibialis and dorsalis pedis pulses Vascular Examination: Capillary refill time immediate b/l. DP pulses palpable b/l. PT pulses faintly palpable.b/l  Pedal hair sparse b/l. No pain with calf compression b/l. Skin temperature gradient WNL b/l. No cyanosis or clubbing b/l. No ischemia or gangrene noted b/l. No edema noted b/l LE.  Neurological Examination: Sensation grossly intact b/l with 10 gram monofilament. Vibratory sensation intact b/l. Pt has subjective symptoms of neuropathy.  Dermatological Examination: Pedal skin with normal turgor, texture and tone b/l.  No open wounds. No interdigital macerations.   Toenails 1-5 b/l thick, discolored, elongated with subungual debris and pain on dorsal palpation.   No hyperkeratotic nor porokeratotic lesions.  Musculoskeletal Examination: Muscle strength 5/5 to all lower extremity muscle groups bilaterally. HAV with bunion deformity noted b/l LE.  Radiographs: None Assessment/Plan: 1. Pain due to onychomycosis of toenails of both feet   2. Diabetic peripheral neuropathy associated with type 2 diabetes mellitus  (HCC)   Patient was evaluated and treated. All patient's and/or POA's questions/concerns addressed on today's visit. Toenails 1-5 b/l debrided in length and girth without incident. Continue foot and shoe inspections daily. Monitor blood glucose per PCP/Endocrinologist's recommendations. Continue soft, supportive shoe gear daily. Report any pedal injuries to medical professional. Call office if there are any questions/concerns. -Patient/POA to call should there be question/concern in the interim.   Return in about 3 months (around 12/29/2024).  Cynthia [name], DPM      [facility] LOCATION: [address] [place] [address]                   Office [phone]   [facility] LOCATION: [address] [place], [place] [address] Office [phone]     [1] No Known Allergies  "

## 2024-10-07 NOTE — Telephone Encounter (Unsigned)
 Copied from CRM [id number]. Topic: Clinical - Medication Question >> Oct 07, 2024  9:41 AM Charolett L wrote: Reason for CRM: Patient called in to confirm that she's taking the levothyroxine  (SYNTHROID ) 112 MCG tablet

## 2024-10-08 NOTE — Telephone Encounter (Signed)
 Pt is aware.

## 2024-11-29 ENCOUNTER — Ambulatory Visit: Admitting: Oncology

## 2024-11-29 ENCOUNTER — Other Ambulatory Visit

## 2024-12-02 ENCOUNTER — Ambulatory Visit: Admitting: Family

## 2024-12-30 ENCOUNTER — Ambulatory Visit: Admitting: Podiatry

## 2025-05-07 ENCOUNTER — Ambulatory Visit
